# Patient Record
Sex: Female | Born: 1970 | Race: Black or African American | Hispanic: No | Marital: Single | Smoking: Never smoker
Health system: Southern US, Community
[De-identification: ages and names within clinical notes are randomized; demographics above are authoritative.]

## PROBLEM LIST (undated history)

## (undated) DIAGNOSIS — E109 Type 1 diabetes mellitus without complications: Secondary | ICD-10-CM

## (undated) DIAGNOSIS — I10 Essential (primary) hypertension: Secondary | ICD-10-CM

## (undated) DIAGNOSIS — J189 Pneumonia, unspecified organism: Secondary | ICD-10-CM

## (undated) DIAGNOSIS — K3184 Gastroparesis: Secondary | ICD-10-CM

## (undated) DIAGNOSIS — I2510 Atherosclerotic heart disease of native coronary artery without angina pectoris: Secondary | ICD-10-CM

## (undated) DIAGNOSIS — Z9989 Dependence on other enabling machines and devices: Secondary | ICD-10-CM

## (undated) DIAGNOSIS — E785 Hyperlipidemia, unspecified: Secondary | ICD-10-CM

## (undated) DIAGNOSIS — K612 Anorectal abscess: Secondary | ICD-10-CM

## (undated) DIAGNOSIS — G5600 Carpal tunnel syndrome, unspecified upper limb: Secondary | ICD-10-CM

## (undated) DIAGNOSIS — N184 Chronic kidney disease, stage 4 (severe): Secondary | ICD-10-CM

## (undated) DIAGNOSIS — E1143 Type 2 diabetes mellitus with diabetic autonomic (poly)neuropathy: Secondary | ICD-10-CM

## (undated) DIAGNOSIS — I119 Hypertensive heart disease without heart failure: Secondary | ICD-10-CM

## (undated) DIAGNOSIS — I350 Nonrheumatic aortic (valve) stenosis: Secondary | ICD-10-CM

## (undated) DIAGNOSIS — Z9289 Personal history of other medical treatment: Secondary | ICD-10-CM

## (undated) DIAGNOSIS — G4733 Obstructive sleep apnea (adult) (pediatric): Secondary | ICD-10-CM

## (undated) DIAGNOSIS — D649 Anemia, unspecified: Secondary | ICD-10-CM

## (undated) DIAGNOSIS — E1121 Type 2 diabetes mellitus with diabetic nephropathy: Secondary | ICD-10-CM

## (undated) HISTORY — DX: Chronic kidney disease, stage 4 (severe): N18.4

## (undated) HISTORY — DX: Atherosclerotic heart disease of native coronary artery without angina pectoris: I25.10

## (undated) HISTORY — DX: Gastroparesis: K31.84

## (undated) HISTORY — DX: Type 2 diabetes mellitus with diabetic nephropathy: E11.21

## (undated) HISTORY — DX: Type 2 diabetes mellitus with diabetic autonomic (poly)neuropathy: E11.43

## (undated) HISTORY — DX: Anemia, unspecified: D64.9

## (undated) HISTORY — DX: Anorectal abscess: K61.2

## (undated) HISTORY — DX: Essential (primary) hypertension: I10

## (undated) HISTORY — DX: Hyperlipidemia, unspecified: E78.5

## (undated) HISTORY — PX: LAPAROSCOPIC CHOLECYSTECTOMY: SUR755

## (undated) HISTORY — DX: Carpal tunnel syndrome, unspecified upper limb: G56.00

---

## 1999-06-10 ENCOUNTER — Encounter: Admission: RE | Admit: 1999-06-10 | Discharge: 1999-09-08 | Payer: Self-pay | Admitting: *Deleted

## 1999-09-22 ENCOUNTER — Other Ambulatory Visit: Admission: RE | Admit: 1999-09-22 | Discharge: 1999-09-22 | Payer: Self-pay | Admitting: Obstetrics & Gynecology

## 2000-02-07 ENCOUNTER — Inpatient Hospital Stay (HOSPITAL_COMMUNITY): Admission: EM | Admit: 2000-02-07 | Discharge: 2000-02-12 | Payer: Self-pay | Admitting: *Deleted

## 2000-02-15 ENCOUNTER — Other Ambulatory Visit (HOSPITAL_COMMUNITY): Admission: RE | Admit: 2000-02-15 | Discharge: 2000-02-19 | Payer: Self-pay | Admitting: Psychiatry

## 2000-10-11 ENCOUNTER — Other Ambulatory Visit: Admission: RE | Admit: 2000-10-11 | Discharge: 2000-10-11 | Payer: Self-pay | Admitting: Obstetrics & Gynecology

## 2001-01-21 ENCOUNTER — Emergency Department (HOSPITAL_COMMUNITY): Admission: EM | Admit: 2001-01-21 | Discharge: 2001-01-21 | Payer: Self-pay

## 2001-01-25 HISTORY — PX: CARPAL TUNNEL RELEASE: SHX101

## 2001-10-07 ENCOUNTER — Emergency Department (HOSPITAL_COMMUNITY): Admission: EM | Admit: 2001-10-07 | Discharge: 2001-10-08 | Payer: Self-pay | Admitting: Emergency Medicine

## 2001-10-31 ENCOUNTER — Encounter (HOSPITAL_COMMUNITY): Admission: RE | Admit: 2001-10-31 | Discharge: 2001-10-31 | Payer: Self-pay | Admitting: *Deleted

## 2001-11-10 ENCOUNTER — Other Ambulatory Visit: Admission: RE | Admit: 2001-11-10 | Discharge: 2001-11-10 | Payer: Self-pay | Admitting: Obstetrics & Gynecology

## 2002-04-04 ENCOUNTER — Other Ambulatory Visit: Admission: RE | Admit: 2002-04-04 | Discharge: 2002-04-04 | Payer: Self-pay | Admitting: Obstetrics & Gynecology

## 2002-04-10 ENCOUNTER — Encounter: Admission: RE | Admit: 2002-04-10 | Discharge: 2002-07-09 | Payer: Self-pay | Admitting: Family Medicine

## 2002-07-26 ENCOUNTER — Encounter: Admission: RE | Admit: 2002-07-26 | Discharge: 2002-10-24 | Payer: Self-pay | Admitting: Family Medicine

## 2002-09-07 ENCOUNTER — Emergency Department (HOSPITAL_COMMUNITY): Admission: EM | Admit: 2002-09-07 | Discharge: 2002-09-07 | Payer: Self-pay | Admitting: Emergency Medicine

## 2002-09-07 ENCOUNTER — Encounter: Payer: Self-pay | Admitting: Emergency Medicine

## 2002-09-13 ENCOUNTER — Emergency Department (HOSPITAL_COMMUNITY): Admission: EM | Admit: 2002-09-13 | Discharge: 2002-09-14 | Payer: Self-pay | Admitting: Emergency Medicine

## 2002-09-14 ENCOUNTER — Encounter: Payer: Self-pay | Admitting: Emergency Medicine

## 2002-10-25 ENCOUNTER — Ambulatory Visit (HOSPITAL_COMMUNITY): Admission: RE | Admit: 2002-10-25 | Discharge: 2002-10-25 | Payer: Self-pay | Admitting: Orthopedic Surgery

## 2002-11-05 ENCOUNTER — Other Ambulatory Visit: Admission: RE | Admit: 2002-11-05 | Discharge: 2002-11-05 | Payer: Self-pay | Admitting: Obstetrics & Gynecology

## 2003-03-24 ENCOUNTER — Emergency Department (HOSPITAL_COMMUNITY): Admission: EM | Admit: 2003-03-24 | Discharge: 2003-03-24 | Payer: Self-pay | Admitting: Emergency Medicine

## 2003-10-08 ENCOUNTER — Emergency Department (HOSPITAL_COMMUNITY): Admission: EM | Admit: 2003-10-08 | Discharge: 2003-10-08 | Payer: Self-pay | Admitting: Emergency Medicine

## 2003-11-29 ENCOUNTER — Emergency Department (HOSPITAL_COMMUNITY): Admission: EM | Admit: 2003-11-29 | Discharge: 2003-11-30 | Payer: Self-pay | Admitting: Emergency Medicine

## 2004-09-27 ENCOUNTER — Inpatient Hospital Stay (HOSPITAL_COMMUNITY): Admission: EM | Admit: 2004-09-27 | Discharge: 2004-09-30 | Payer: Self-pay | Admitting: Emergency Medicine

## 2004-09-30 ENCOUNTER — Ambulatory Visit: Payer: Self-pay | Admitting: Psychiatry

## 2004-10-01 ENCOUNTER — Inpatient Hospital Stay (HOSPITAL_COMMUNITY): Admission: RE | Admit: 2004-10-01 | Discharge: 2004-10-05 | Payer: Self-pay | Admitting: Psychiatry

## 2004-10-10 ENCOUNTER — Emergency Department (HOSPITAL_COMMUNITY): Admission: EM | Admit: 2004-10-10 | Discharge: 2004-10-10 | Payer: Self-pay | Admitting: Emergency Medicine

## 2004-10-11 ENCOUNTER — Emergency Department (HOSPITAL_COMMUNITY): Admission: EM | Admit: 2004-10-11 | Discharge: 2004-10-11 | Payer: Self-pay | Admitting: Emergency Medicine

## 2004-12-09 ENCOUNTER — Emergency Department (HOSPITAL_COMMUNITY): Admission: EM | Admit: 2004-12-09 | Discharge: 2004-12-10 | Payer: Self-pay | Admitting: Emergency Medicine

## 2005-03-04 ENCOUNTER — Inpatient Hospital Stay (HOSPITAL_COMMUNITY): Admission: EM | Admit: 2005-03-04 | Discharge: 2005-03-06 | Payer: Self-pay | Admitting: Emergency Medicine

## 2005-04-26 ENCOUNTER — Emergency Department (HOSPITAL_COMMUNITY): Admission: EM | Admit: 2005-04-26 | Discharge: 2005-04-26 | Payer: Self-pay | Admitting: Emergency Medicine

## 2005-07-09 ENCOUNTER — Emergency Department (HOSPITAL_COMMUNITY): Admission: EM | Admit: 2005-07-09 | Discharge: 2005-07-09 | Payer: Self-pay | Admitting: Emergency Medicine

## 2005-07-15 ENCOUNTER — Inpatient Hospital Stay (HOSPITAL_COMMUNITY): Admission: RE | Admit: 2005-07-15 | Discharge: 2005-07-24 | Payer: Self-pay | Admitting: *Deleted

## 2005-07-16 ENCOUNTER — Ambulatory Visit: Payer: Self-pay | Admitting: *Deleted

## 2005-09-14 ENCOUNTER — Ambulatory Visit (HOSPITAL_COMMUNITY): Admission: RE | Admit: 2005-09-14 | Discharge: 2005-09-14 | Payer: Self-pay | Admitting: Obstetrics and Gynecology

## 2005-09-28 ENCOUNTER — Emergency Department (HOSPITAL_COMMUNITY): Admission: EM | Admit: 2005-09-28 | Discharge: 2005-09-28 | Payer: Self-pay | Admitting: Emergency Medicine

## 2005-10-04 ENCOUNTER — Ambulatory Visit: Payer: Self-pay | Admitting: Family Medicine

## 2005-10-05 ENCOUNTER — Ambulatory Visit: Payer: Self-pay | Admitting: *Deleted

## 2006-01-13 ENCOUNTER — Ambulatory Visit: Payer: Self-pay | Admitting: Psychiatry

## 2006-01-13 ENCOUNTER — Inpatient Hospital Stay (HOSPITAL_COMMUNITY): Admission: AD | Admit: 2006-01-13 | Discharge: 2006-01-21 | Payer: Self-pay | Admitting: Psychiatry

## 2006-03-07 ENCOUNTER — Ambulatory Visit: Payer: Self-pay | Admitting: Endocrinology

## 2006-03-23 ENCOUNTER — Inpatient Hospital Stay (HOSPITAL_COMMUNITY): Admission: EM | Admit: 2006-03-23 | Discharge: 2006-03-24 | Payer: Self-pay | Admitting: Emergency Medicine

## 2006-04-04 ENCOUNTER — Emergency Department (HOSPITAL_COMMUNITY): Admission: EM | Admit: 2006-04-04 | Discharge: 2006-04-04 | Payer: Self-pay | Admitting: Emergency Medicine

## 2006-04-13 ENCOUNTER — Emergency Department (HOSPITAL_COMMUNITY): Admission: EM | Admit: 2006-04-13 | Discharge: 2006-04-13 | Payer: Self-pay | Admitting: Emergency Medicine

## 2006-04-14 ENCOUNTER — Emergency Department (HOSPITAL_COMMUNITY): Admission: EM | Admit: 2006-04-14 | Discharge: 2006-04-14 | Payer: Self-pay | Admitting: Emergency Medicine

## 2006-04-24 ENCOUNTER — Ambulatory Visit (HOSPITAL_COMMUNITY): Admission: RE | Admit: 2006-04-24 | Discharge: 2006-04-24 | Payer: Self-pay | Admitting: Orthopedic Surgery

## 2006-08-06 ENCOUNTER — Emergency Department (HOSPITAL_COMMUNITY): Admission: EM | Admit: 2006-08-06 | Discharge: 2006-08-06 | Payer: Self-pay | Admitting: Emergency Medicine

## 2006-10-24 ENCOUNTER — Emergency Department (HOSPITAL_COMMUNITY): Admission: EM | Admit: 2006-10-24 | Discharge: 2006-10-24 | Payer: Self-pay | Admitting: Emergency Medicine

## 2006-10-24 ENCOUNTER — Encounter: Payer: Self-pay | Admitting: *Deleted

## 2006-10-24 DIAGNOSIS — E13319 Other specified diabetes mellitus with unspecified diabetic retinopathy without macular edema: Secondary | ICD-10-CM | POA: Insufficient documentation

## 2006-10-24 DIAGNOSIS — F329 Major depressive disorder, single episode, unspecified: Secondary | ICD-10-CM | POA: Insufficient documentation

## 2006-10-24 DIAGNOSIS — G5600 Carpal tunnel syndrome, unspecified upper limb: Secondary | ICD-10-CM | POA: Insufficient documentation

## 2006-10-24 DIAGNOSIS — J309 Allergic rhinitis, unspecified: Secondary | ICD-10-CM | POA: Insufficient documentation

## 2006-11-01 ENCOUNTER — Emergency Department (HOSPITAL_COMMUNITY): Admission: EM | Admit: 2006-11-01 | Discharge: 2006-11-01 | Payer: Self-pay | Admitting: Emergency Medicine

## 2006-11-08 ENCOUNTER — Other Ambulatory Visit: Payer: Self-pay | Admitting: Emergency Medicine

## 2006-11-09 ENCOUNTER — Inpatient Hospital Stay (HOSPITAL_COMMUNITY): Admission: AD | Admit: 2006-11-09 | Discharge: 2006-11-12 | Payer: Self-pay | Admitting: Internal Medicine

## 2006-11-09 ENCOUNTER — Encounter: Payer: Self-pay | Admitting: Internal Medicine

## 2006-12-13 ENCOUNTER — Ambulatory Visit (HOSPITAL_COMMUNITY): Admission: RE | Admit: 2006-12-13 | Discharge: 2006-12-13 | Payer: Self-pay | Admitting: Gastroenterology

## 2007-02-15 ENCOUNTER — Telehealth: Payer: Self-pay | Admitting: Internal Medicine

## 2007-02-16 ENCOUNTER — Encounter: Payer: Self-pay | Admitting: Endocrinology

## 2007-02-17 ENCOUNTER — Encounter: Payer: Self-pay | Admitting: Internal Medicine

## 2007-02-25 ENCOUNTER — Emergency Department (HOSPITAL_COMMUNITY): Admission: EM | Admit: 2007-02-25 | Discharge: 2007-02-25 | Payer: Self-pay | Admitting: Emergency Medicine

## 2007-03-16 ENCOUNTER — Encounter: Payer: Self-pay | Admitting: Endocrinology

## 2007-09-09 ENCOUNTER — Emergency Department (HOSPITAL_COMMUNITY): Admission: EM | Admit: 2007-09-09 | Discharge: 2007-09-09 | Payer: Self-pay | Admitting: Emergency Medicine

## 2007-09-17 ENCOUNTER — Emergency Department (HOSPITAL_COMMUNITY): Admission: EM | Admit: 2007-09-17 | Discharge: 2007-09-17 | Payer: Self-pay | Admitting: Emergency Medicine

## 2008-01-12 ENCOUNTER — Encounter (INDEPENDENT_AMBULATORY_CARE_PROVIDER_SITE_OTHER): Payer: Self-pay | Admitting: General Surgery

## 2008-01-12 ENCOUNTER — Ambulatory Visit (HOSPITAL_COMMUNITY): Admission: RE | Admit: 2008-01-12 | Discharge: 2008-01-12 | Payer: Self-pay | Admitting: General Surgery

## 2008-01-26 HISTORY — PX: CARDIAC CATHETERIZATION: SHX172

## 2008-02-11 ENCOUNTER — Emergency Department (HOSPITAL_BASED_OUTPATIENT_CLINIC_OR_DEPARTMENT_OTHER): Admission: EM | Admit: 2008-02-11 | Discharge: 2008-02-11 | Payer: Self-pay | Admitting: Emergency Medicine

## 2008-02-19 ENCOUNTER — Ambulatory Visit: Payer: Self-pay | Admitting: Radiology

## 2008-02-19 ENCOUNTER — Emergency Department (HOSPITAL_BASED_OUTPATIENT_CLINIC_OR_DEPARTMENT_OTHER): Admission: EM | Admit: 2008-02-19 | Discharge: 2008-02-19 | Payer: Self-pay | Admitting: Emergency Medicine

## 2008-03-03 ENCOUNTER — Emergency Department (HOSPITAL_BASED_OUTPATIENT_CLINIC_OR_DEPARTMENT_OTHER): Admission: EM | Admit: 2008-03-03 | Discharge: 2008-03-03 | Payer: Self-pay | Admitting: Emergency Medicine

## 2008-03-09 ENCOUNTER — Emergency Department (HOSPITAL_BASED_OUTPATIENT_CLINIC_OR_DEPARTMENT_OTHER): Admission: EM | Admit: 2008-03-09 | Discharge: 2008-03-09 | Payer: Self-pay | Admitting: Emergency Medicine

## 2008-03-25 ENCOUNTER — Ambulatory Visit: Payer: Self-pay | Admitting: Diagnostic Radiology

## 2008-03-25 ENCOUNTER — Emergency Department (HOSPITAL_BASED_OUTPATIENT_CLINIC_OR_DEPARTMENT_OTHER): Admission: EM | Admit: 2008-03-25 | Discharge: 2008-03-25 | Payer: Self-pay | Admitting: Emergency Medicine

## 2008-04-01 ENCOUNTER — Emergency Department (HOSPITAL_BASED_OUTPATIENT_CLINIC_OR_DEPARTMENT_OTHER): Admission: EM | Admit: 2008-04-01 | Discharge: 2008-04-01 | Payer: Self-pay | Admitting: Emergency Medicine

## 2008-04-11 ENCOUNTER — Encounter: Admission: RE | Admit: 2008-04-11 | Discharge: 2008-04-11 | Payer: Self-pay | Admitting: Endocrinology

## 2008-04-13 ENCOUNTER — Emergency Department (HOSPITAL_BASED_OUTPATIENT_CLINIC_OR_DEPARTMENT_OTHER): Admission: EM | Admit: 2008-04-13 | Discharge: 2008-04-13 | Payer: Self-pay | Admitting: Emergency Medicine

## 2008-04-29 ENCOUNTER — Ambulatory Visit: Payer: Self-pay | Admitting: Internal Medicine

## 2008-04-29 ENCOUNTER — Ambulatory Visit: Payer: Self-pay | Admitting: Diagnostic Radiology

## 2008-04-29 ENCOUNTER — Encounter: Payer: Self-pay | Admitting: Emergency Medicine

## 2008-04-29 ENCOUNTER — Inpatient Hospital Stay (HOSPITAL_COMMUNITY): Admission: EM | Admit: 2008-04-29 | Discharge: 2008-05-03 | Payer: Self-pay | Admitting: Internal Medicine

## 2008-04-30 ENCOUNTER — Encounter (INDEPENDENT_AMBULATORY_CARE_PROVIDER_SITE_OTHER): Payer: Self-pay | Admitting: Internal Medicine

## 2008-05-21 ENCOUNTER — Emergency Department (HOSPITAL_COMMUNITY): Admission: EM | Admit: 2008-05-21 | Discharge: 2008-05-21 | Payer: Self-pay | Admitting: Emergency Medicine

## 2008-06-03 ENCOUNTER — Emergency Department (HOSPITAL_BASED_OUTPATIENT_CLINIC_OR_DEPARTMENT_OTHER): Admission: EM | Admit: 2008-06-03 | Discharge: 2008-06-03 | Payer: Self-pay | Admitting: Emergency Medicine

## 2008-06-03 ENCOUNTER — Ambulatory Visit: Payer: Self-pay | Admitting: Radiology

## 2008-06-19 ENCOUNTER — Emergency Department (HOSPITAL_BASED_OUTPATIENT_CLINIC_OR_DEPARTMENT_OTHER): Admission: EM | Admit: 2008-06-19 | Discharge: 2008-06-19 | Payer: Self-pay | Admitting: Emergency Medicine

## 2008-06-29 ENCOUNTER — Emergency Department (HOSPITAL_BASED_OUTPATIENT_CLINIC_OR_DEPARTMENT_OTHER): Admission: EM | Admit: 2008-06-29 | Discharge: 2008-06-29 | Payer: Self-pay | Admitting: Emergency Medicine

## 2008-07-02 ENCOUNTER — Ambulatory Visit (HOSPITAL_COMMUNITY): Admission: RE | Admit: 2008-07-02 | Discharge: 2008-07-02 | Payer: Self-pay | Admitting: *Deleted

## 2008-07-07 ENCOUNTER — Emergency Department (HOSPITAL_BASED_OUTPATIENT_CLINIC_OR_DEPARTMENT_OTHER): Admission: EM | Admit: 2008-07-07 | Discharge: 2008-07-07 | Payer: Self-pay | Admitting: Emergency Medicine

## 2008-07-29 ENCOUNTER — Emergency Department (HOSPITAL_COMMUNITY): Admission: EM | Admit: 2008-07-29 | Discharge: 2008-07-29 | Payer: Self-pay | Admitting: Emergency Medicine

## 2008-08-18 ENCOUNTER — Telehealth: Payer: Self-pay | Admitting: Gastroenterology

## 2008-10-08 ENCOUNTER — Emergency Department (HOSPITAL_BASED_OUTPATIENT_CLINIC_OR_DEPARTMENT_OTHER): Admission: EM | Admit: 2008-10-08 | Discharge: 2008-10-08 | Payer: Self-pay | Admitting: Emergency Medicine

## 2008-10-08 ENCOUNTER — Ambulatory Visit: Payer: Self-pay | Admitting: Interventional Radiology

## 2008-12-22 ENCOUNTER — Emergency Department (HOSPITAL_COMMUNITY): Admission: EM | Admit: 2008-12-22 | Discharge: 2008-12-22 | Payer: Self-pay | Admitting: Emergency Medicine

## 2008-12-24 ENCOUNTER — Emergency Department (HOSPITAL_COMMUNITY): Admission: EM | Admit: 2008-12-24 | Discharge: 2008-12-24 | Payer: Self-pay | Admitting: Emergency Medicine

## 2009-01-30 ENCOUNTER — Encounter (HOSPITAL_COMMUNITY): Admission: RE | Admit: 2009-01-30 | Discharge: 2009-04-30 | Payer: Self-pay | Admitting: Nephrology

## 2009-03-09 IMAGING — CT CT HEAD W/O CM
1 series · 16 of 30 positions shown, 20 images · IV contrast (agent unspecified)
Comparison: CT head of 10/11/04.

CLINICAL DATA: Headache.
 HEAD CT WITHOUT CONTRAST:
TECHNIQUE: Contiguous axial images were obtained from the base of the skull through the vertex according to standard protocol without contrast.

[Series 2: head_seq 4.5 h37s st · axial · 0.43mm/px · z∈[-622,-496]mm · 16 of 32 slices shown, 20 images]
[im 2/32  brain]
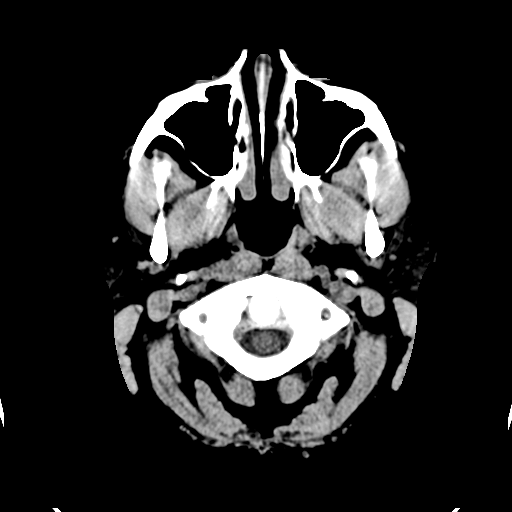
[im 2/32  bone]
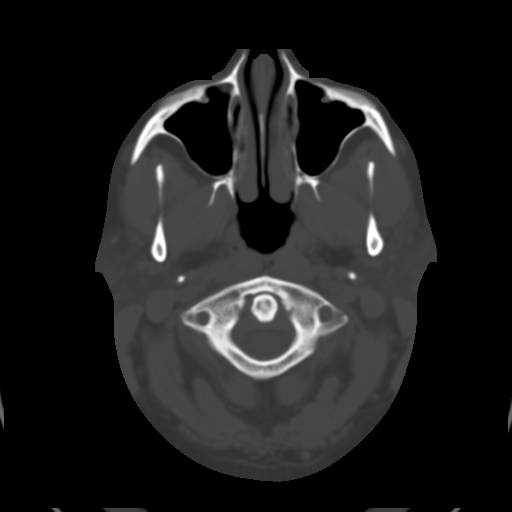
[im 4/32  brain]
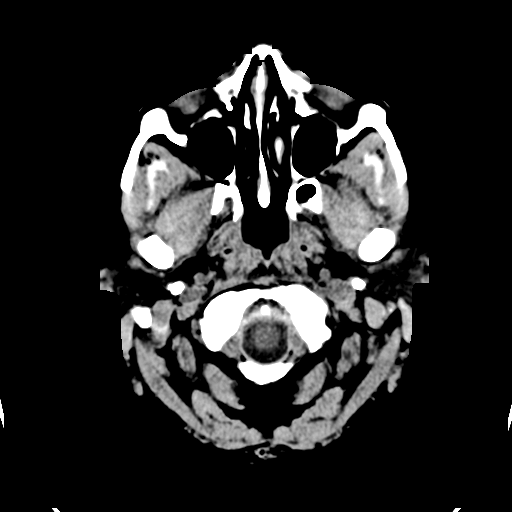
[im 6/32  brain]
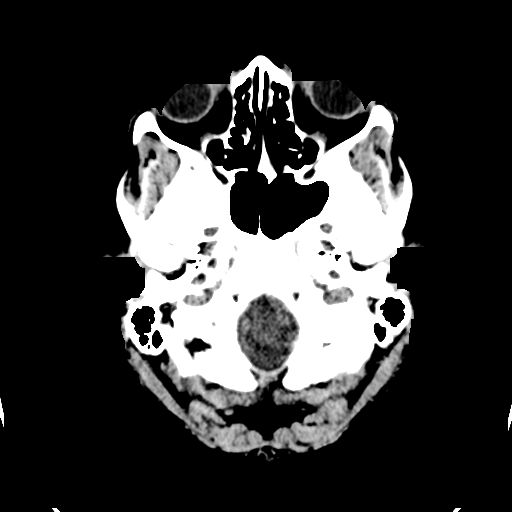
[im 8/32  brain]
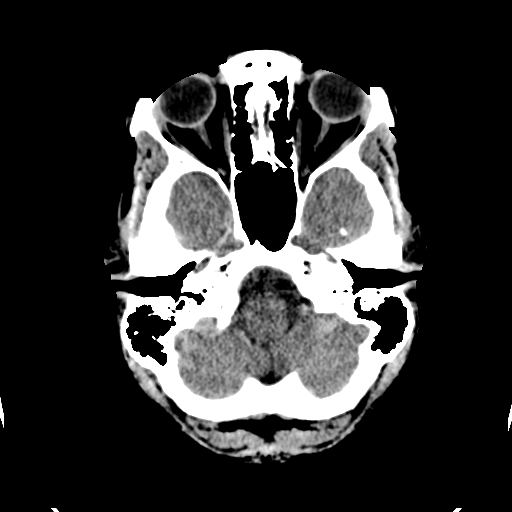
[im 9/32  brain]
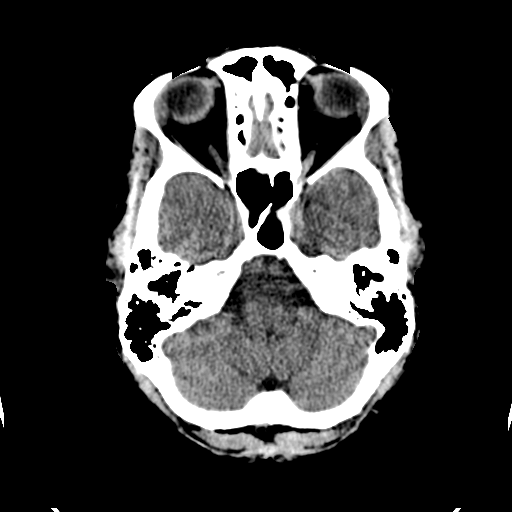
[im 9/32  bone]
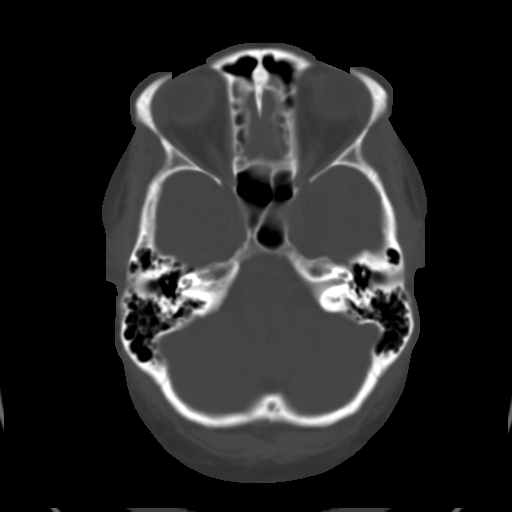
[im 11/32  brain]
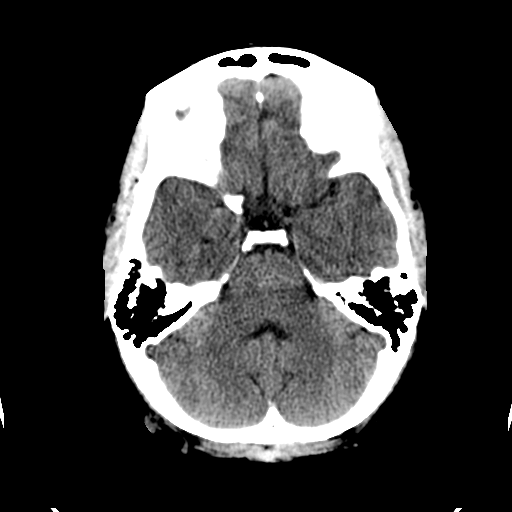
[im 13/32  brain]
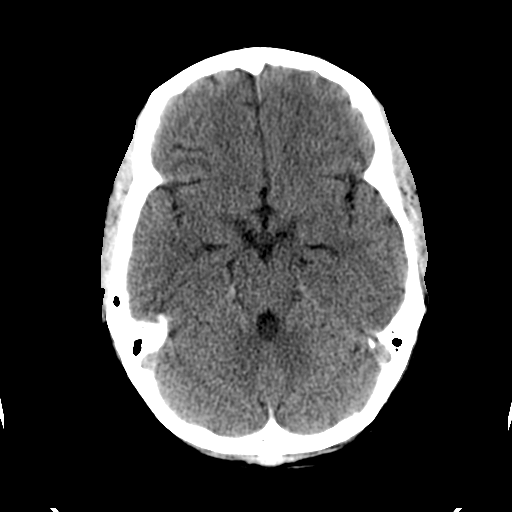
[im 15/32  brain]
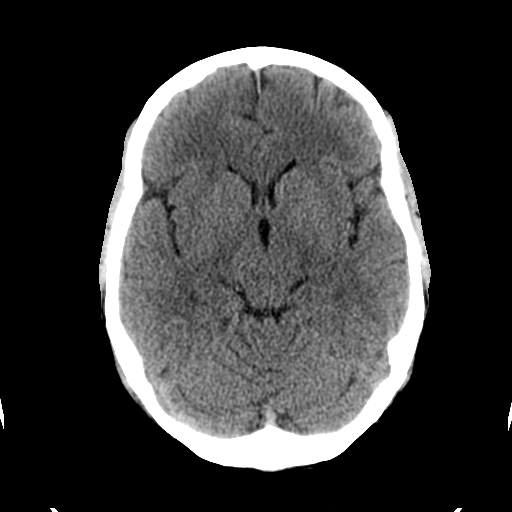
[im 17/32  brain]
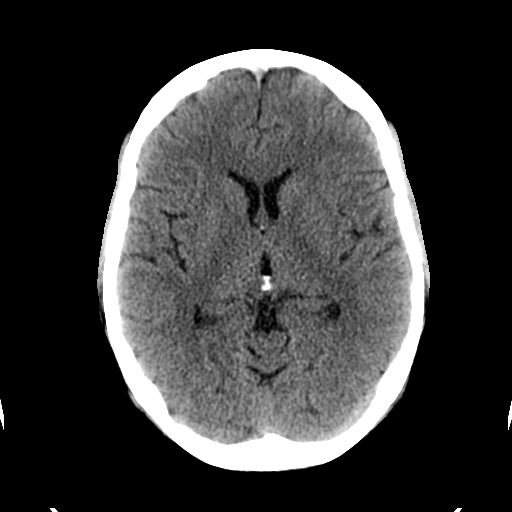
[im 17/32  bone]
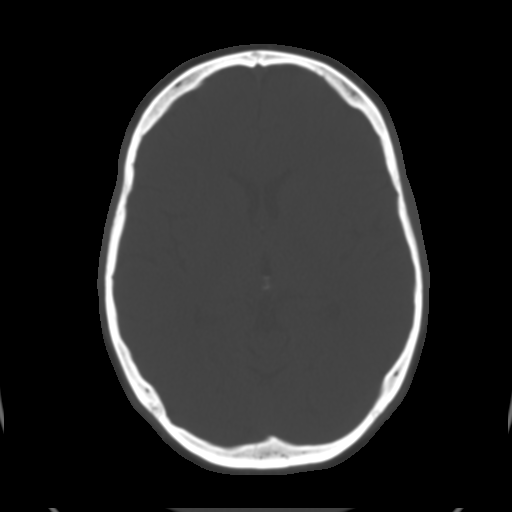
[im 19/32  brain]
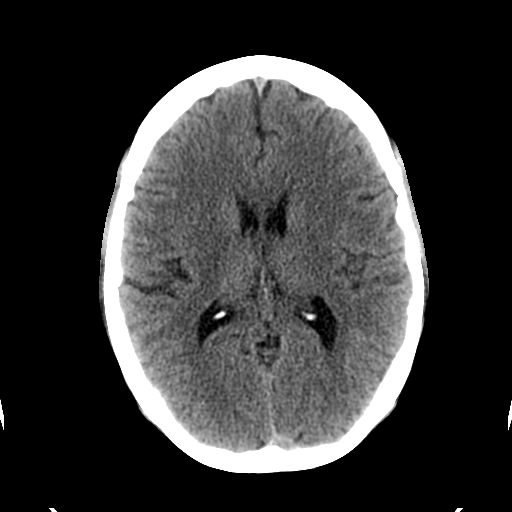
[im 21/32  brain]
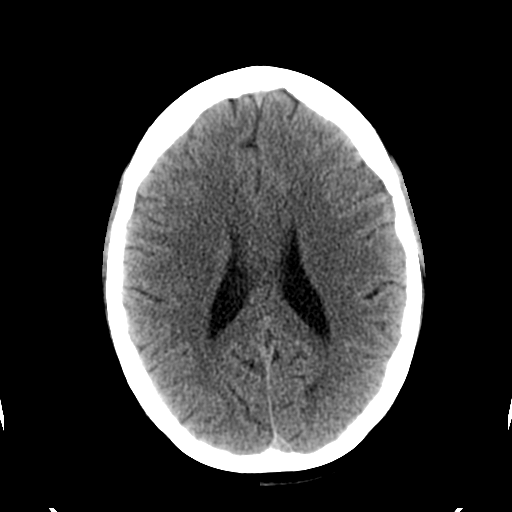
[im 23/32  brain]
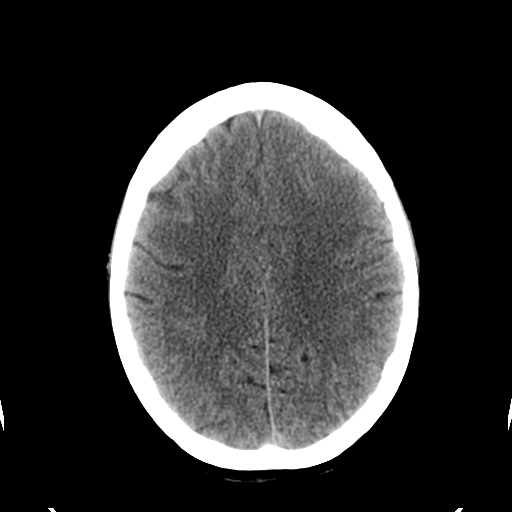
[im 24/32  brain]
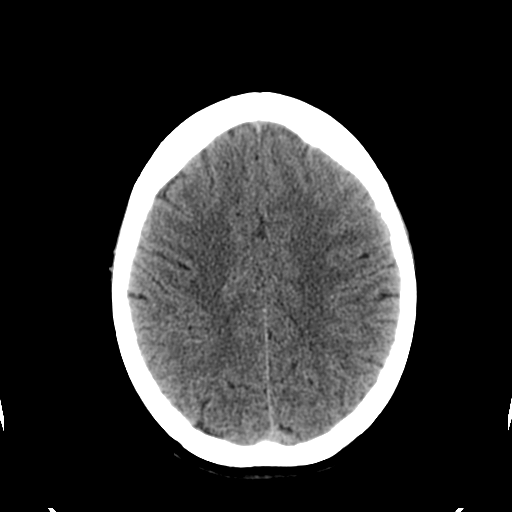
[im 24/32  bone]
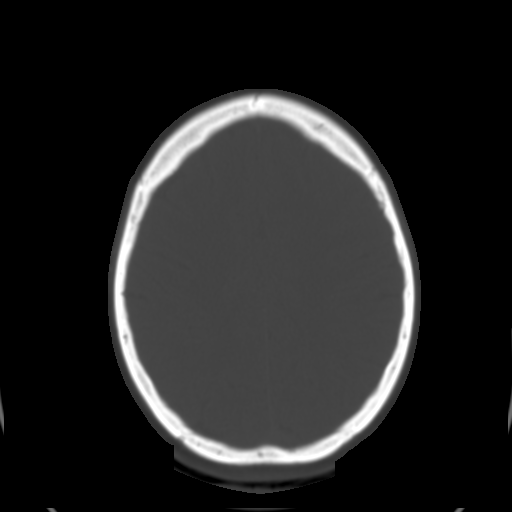
[im 26/32  brain]
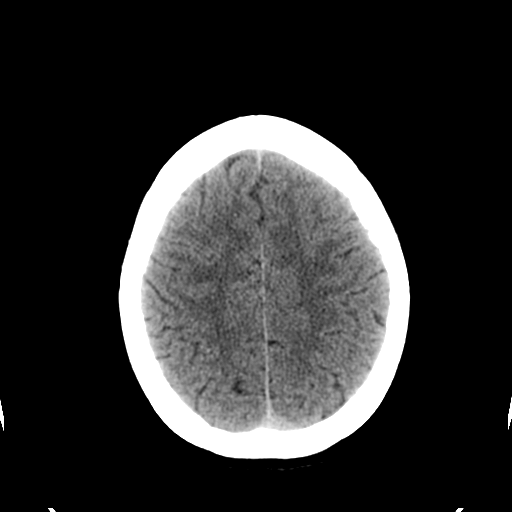
[im 28/32  brain]
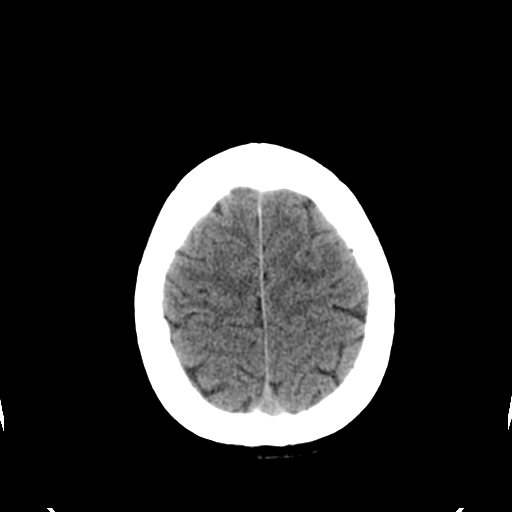
[im 30/32  brain]
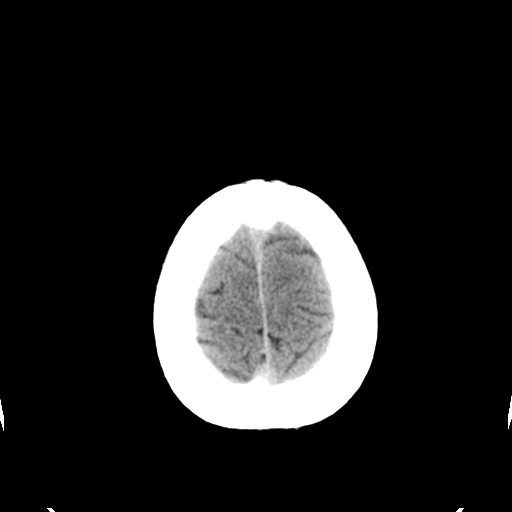

[16 of 30 positions shown; findings below may reference images not displayed]

FINDINGS: No extraaxial fluid collections or intraparenchymal hemorrhage.  The ventricles have normal volume.  No evidence of mass effect or midline shift.  Basilar cisterns are patent.  Normal gray-white matter differentiation.  Paranasal sinuses and mastoid air cells are clear.  Normal orbits.
IMPRESSION: 1. No acute intracranial process.
 2. No change from prior CT head.

## 2009-05-07 ENCOUNTER — Encounter (INDEPENDENT_AMBULATORY_CARE_PROVIDER_SITE_OTHER): Payer: Self-pay | Admitting: Internal Medicine

## 2009-05-07 ENCOUNTER — Ambulatory Visit: Payer: Self-pay | Admitting: Vascular Surgery

## 2009-05-14 ENCOUNTER — Ambulatory Visit: Payer: Self-pay | Admitting: Internal Medicine

## 2009-08-05 ENCOUNTER — Inpatient Hospital Stay (HOSPITAL_COMMUNITY): Admission: EM | Admit: 2009-08-05 | Discharge: 2009-08-08 | Payer: Self-pay | Admitting: Emergency Medicine

## 2010-01-01 ENCOUNTER — Inpatient Hospital Stay (HOSPITAL_COMMUNITY): Admission: EM | Admit: 2010-01-01 | Discharge: 2009-05-08 | Payer: Self-pay | Admitting: Emergency Medicine

## 2010-01-30 ENCOUNTER — Emergency Department (HOSPITAL_COMMUNITY)
Admission: EM | Admit: 2010-01-30 | Discharge: 2010-01-31 | Payer: Self-pay | Source: Home / Self Care | Admitting: Emergency Medicine

## 2010-02-09 LAB — POCT I-STAT, CHEM 8
BUN: 50 mg/dL — ABNORMAL HIGH (ref 6–23)
Calcium, Ion: 1.19 mmol/L (ref 1.12–1.32)
Chloride: 110 mEq/L (ref 96–112)
Creatinine, Ser: 1.5 mg/dL — ABNORMAL HIGH (ref 0.4–1.2)
Glucose, Bld: 251 mg/dL — ABNORMAL HIGH (ref 70–99)
HCT: 29 % — ABNORMAL LOW (ref 36.0–46.0)
Hemoglobin: 9.9 g/dL — ABNORMAL LOW (ref 12.0–15.0)
Potassium: 4.4 mEq/L (ref 3.5–5.1)
Sodium: 137 mEq/L (ref 135–145)
TCO2: 23 mmol/L (ref 0–100)

## 2010-02-15 ENCOUNTER — Encounter: Payer: Self-pay | Admitting: Emergency Medicine

## 2010-02-15 ENCOUNTER — Encounter: Payer: Self-pay | Admitting: Gastroenterology

## 2010-02-16 ENCOUNTER — Encounter: Payer: Self-pay | Admitting: *Deleted

## 2010-04-11 LAB — GLUCOSE, CAPILLARY
Glucose-Capillary: 117 mg/dL — ABNORMAL HIGH (ref 70–99)
Glucose-Capillary: 97 mg/dL (ref 70–99)

## 2010-04-11 LAB — COMPREHENSIVE METABOLIC PANEL
ALT: 13 U/L (ref 0–35)
AST: 15 U/L (ref 0–37)
Alkaline Phosphatase: 101 U/L (ref 39–117)
CO2: 16 mEq/L — ABNORMAL LOW (ref 19–32)
Glucose, Bld: 94 mg/dL (ref 70–99)
Potassium: 5 mEq/L (ref 3.5–5.1)
Sodium: 137 mEq/L (ref 135–145)
Total Protein: 6.9 g/dL (ref 6.0–8.3)

## 2010-04-11 LAB — CARDIAC PANEL(CRET KIN+CKTOT+MB+TROPI): Relative Index: 1.8 (ref 0.0–2.5)

## 2010-04-11 LAB — MAGNESIUM: Magnesium: 2.7 mg/dL — ABNORMAL HIGH (ref 1.5–2.5)

## 2010-04-11 LAB — CBC
HCT: 24 % — ABNORMAL LOW (ref 36.0–46.0)
Hemoglobin: 8.1 g/dL — ABNORMAL LOW (ref 12.0–15.0)
WBC: 7 10*3/uL (ref 4.0–10.5)

## 2010-04-12 LAB — FERRITIN
Ferritin: 97 ng/mL (ref 10–291)
Ferritin: 47 ng/mL (ref 10–291)

## 2010-04-12 LAB — COMPREHENSIVE METABOLIC PANEL
ALT: 12 U/L (ref 0–35)
Albumin: 3.1 g/dL — ABNORMAL LOW (ref 3.5–5.2)
Alkaline Phosphatase: 110 U/L (ref 39–117)
Alkaline Phosphatase: 111 U/L (ref 39–117)
BUN: 25 mg/dL — ABNORMAL HIGH (ref 6–23)
BUN: 28 mg/dL — ABNORMAL HIGH (ref 6–23)
CO2: 17 mEq/L — ABNORMAL LOW (ref 19–32)
Calcium: 8.7 mg/dL (ref 8.4–10.5)
Chloride: 118 mEq/L — ABNORMAL HIGH (ref 96–112)
GFR calc non Af Amer: 32 mL/min — ABNORMAL LOW (ref >60)
Glucose, Bld: 140 mg/dL — ABNORMAL HIGH (ref 70–99)
Potassium: 6 mEq/L — ABNORMAL HIGH (ref 3.5–5.1)
Potassium: 7 mEq/L (ref 3.5–5.1)
Sodium: 136 mEq/L (ref 135–145)
Total Bilirubin: 0.3 mg/dL (ref 0.3–1.2)
Total Protein: 6.7 g/dL (ref 6.0–8.3)

## 2010-04-12 LAB — DIFFERENTIAL
Basophils Absolute: 0 10*3/uL (ref 0.0–0.1)
Basophils Absolute: 0.1 10*3/uL (ref 0.0–0.1)
Basophils Relative: 0 % (ref 0–1)
Basophils Relative: 1 % (ref 0–1)
Eosinophils Absolute: 0.2 10*3/uL (ref 0.0–0.7)
Neutro Abs: 5.2 10*3/uL (ref 1.7–7.7)
Neutro Abs: 5.2 10*3/uL (ref 1.7–7.7)
Neutrophils Relative %: 66 % (ref 43–77)
Neutrophils Relative %: 67 % (ref 43–77)

## 2010-04-12 LAB — BASIC METABOLIC PANEL
BUN: 28 mg/dL — ABNORMAL HIGH (ref 6–23)
CO2: 15 mEq/L — ABNORMAL LOW (ref 19–32)
CO2: 17 mEq/L — ABNORMAL LOW (ref 19–32)
Calcium: 8.2 mg/dL — ABNORMAL LOW (ref 8.4–10.5)
Calcium: 8.5 mg/dL (ref 8.4–10.5)
Calcium: 9.1 mg/dL (ref 8.4–10.5)
Creatinine, Ser: 1.61 mg/dL — ABNORMAL HIGH (ref 0.4–1.2)
GFR calc Af Amer: 41 mL/min — ABNORMAL LOW (ref >60)
GFR calc Af Amer: 48 mL/min — ABNORMAL LOW (ref >60)
GFR calc non Af Amer: 34 mL/min — ABNORMAL LOW (ref >60)
GFR calc non Af Amer: 39 mL/min — ABNORMAL LOW (ref >60)
Glucose, Bld: 124 mg/dL — ABNORMAL HIGH (ref 70–99)
Glucose, Bld: 81 mg/dL (ref 70–99)
Potassium: 5.1 mEq/L (ref 3.5–5.1)
Potassium: 5.2 mEq/L — ABNORMAL HIGH (ref 3.5–5.1)
Sodium: 137 mEq/L (ref 135–145)
Sodium: 137 mEq/L (ref 135–145)
Sodium: 137 mEq/L (ref 135–145)

## 2010-04-12 LAB — GLUCOSE, CAPILLARY
Glucose-Capillary: 114 mg/dL — ABNORMAL HIGH (ref 70–99)
Glucose-Capillary: 128 mg/dL — ABNORMAL HIGH (ref 70–99)
Glucose-Capillary: 155 mg/dL — ABNORMAL HIGH (ref 70–99)
Glucose-Capillary: 68 mg/dL — ABNORMAL LOW (ref 70–99)
Glucose-Capillary: 82 mg/dL (ref 70–99)
Glucose-Capillary: 83 mg/dL (ref 70–99)
Glucose-Capillary: 87 mg/dL (ref 70–99)
Glucose-Capillary: 94 mg/dL (ref 70–99)
Glucose-Capillary: 95 mg/dL (ref 70–99)
Glucose-Capillary: 291 mg/dL — ABNORMAL HIGH (ref 70–99)

## 2010-04-12 LAB — CK TOTAL AND CKMB (NOT AT ARMC): Relative Index: 3.3 — ABNORMAL HIGH (ref 0.0–2.5)

## 2010-04-12 LAB — CBC
HCT: 22.1 % — ABNORMAL LOW (ref 36.0–46.0)
HCT: 24 % — ABNORMAL LOW (ref 36.0–46.0)
Hemoglobin: 7.5 g/dL — ABNORMAL LOW (ref 12.0–15.0)
Hemoglobin: 8.1 g/dL — ABNORMAL LOW (ref 12.0–15.0)
MCH: 28.5 pg (ref 26.0–34.0)
MCHC: 33.5 g/dL (ref 30.0–36.0)
MCHC: 34.1 g/dL (ref 30.0–36.0)
MCV: 83.8 fL (ref 78.0–100.0)
RBC: 2.64 MIL/uL — ABNORMAL LOW (ref 3.87–5.11)
RBC: 2.87 MIL/uL — ABNORMAL LOW (ref 3.87–5.11)
RDW: 14.1 % (ref 11.5–15.5)
RDW: 14.3 % (ref 11.5–15.5)
WBC: 7.7 10*3/uL (ref 4.0–10.5)

## 2010-04-12 LAB — URINE MICROSCOPIC-ADD ON

## 2010-04-12 LAB — TYPE AND SCREEN: ABO/RH(D): A POS

## 2010-04-12 LAB — URINE CULTURE
Colony Count: NO GROWTH
Culture: NO GROWTH
Special Requests: NEGATIVE

## 2010-04-12 LAB — BLOOD GAS, VENOUS
Acid-base deficit: 11.5 mmol/L — ABNORMAL HIGH (ref 0.0–2.0)
Patient temperature: 98.6
TCO2: 13.5 mmol/L (ref 0–100)
pCO2, Ven: 29.1 mmHg — ABNORMAL LOW (ref 45.0–50.0)
pH, Ven: 7.293 (ref 7.250–7.300)

## 2010-04-12 LAB — IRON AND TIBC
Iron: 46 ug/dL (ref 42–135)
Saturation Ratios: 16 % — ABNORMAL LOW (ref 20–55)
Saturation Ratios: 16 % — ABNORMAL LOW (ref 20–55)
Saturation Ratios: 15 % — ABNORMAL LOW (ref 20–55)
TIBC: 264 ug/dL (ref 250–470)
UIBC: 233 ug/dL
UIBC: 254 ug/dL

## 2010-04-12 LAB — PROTIME-INR
INR: 1.22 (ref 0.00–1.49)
Prothrombin Time: 15.3 seconds — ABNORMAL HIGH (ref 11.6–15.2)

## 2010-04-12 LAB — LIPID PANEL
HDL: 33 mg/dL — ABNORMAL LOW (ref >39)
LDL Cholesterol: 218 mg/dL — ABNORMAL HIGH (ref 0–99)
Total CHOL/HDL Ratio: 8.5 RATIO
VLDL: 29 mg/dL (ref 0–40)

## 2010-04-12 LAB — VITAMIN B12: Vitamin B-12: 491 pg/mL (ref 211–911)

## 2010-04-12 LAB — GRAM STAIN

## 2010-04-12 LAB — URINALYSIS, ROUTINE W REFLEX MICROSCOPIC
Glucose, UA: NEGATIVE mg/dL
Ketones, ur: NEGATIVE mg/dL
Leukocytes, UA: NEGATIVE
Protein, ur: 100 mg/dL — AB
Urobilinogen, UA: 0.2 mg/dL (ref 0.0–1.0)

## 2010-04-12 LAB — CARDIAC PANEL(CRET KIN+CKTOT+MB+TROPI)
CK, MB: 3.7 ng/mL (ref 0.3–4.0)
CK, MB: 4.8 ng/mL — ABNORMAL HIGH (ref 0.3–4.0)
Relative Index: 3.4 — ABNORMAL HIGH (ref 0.0–2.5)
Total CK: 149 U/L (ref 7–177)
Troponin I: 0.01 ng/mL (ref 0.00–0.06)
Troponin I: 0.02 ng/mL (ref 0.00–0.06)

## 2010-04-12 LAB — RETICULOCYTES
RBC.: 2.85 MIL/uL — ABNORMAL LOW (ref 3.87–5.11)
Retic Ct Pct: 1.6 % (ref 0.4–3.1)
Retic Ct Pct: 2 % (ref 0.4–3.1)

## 2010-04-12 LAB — HEMOGLOBIN A1C: Hgb A1c MFr Bld: 11.2 % — ABNORMAL HIGH (ref <5.7)

## 2010-04-12 LAB — POTASSIUM: Potassium: 7.2 mEq/L (ref 3.5–5.1)

## 2010-04-12 LAB — CULTURE, ROUTINE-ABSCESS

## 2010-04-12 LAB — POCT PREGNANCY, URINE: Preg Test, Ur: NEGATIVE

## 2010-04-15 LAB — GLUCOSE, CAPILLARY
Glucose-Capillary: 123 mg/dL — ABNORMAL HIGH (ref 70–99)
Glucose-Capillary: 149 mg/dL — ABNORMAL HIGH (ref 70–99)
Glucose-Capillary: 158 mg/dL — ABNORMAL HIGH (ref 70–99)
Glucose-Capillary: 160 mg/dL — ABNORMAL HIGH (ref 70–99)

## 2010-04-15 LAB — URINALYSIS, ROUTINE W REFLEX MICROSCOPIC
Bilirubin Urine: NEGATIVE
Glucose, UA: NEGATIVE mg/dL
Hgb urine dipstick: NEGATIVE
Ketones, ur: NEGATIVE mg/dL
Specific Gravity, Urine: 1.011 (ref 1.005–1.030)
pH: 5 (ref 5.0–8.0)

## 2010-04-15 LAB — COMPREHENSIVE METABOLIC PANEL
BUN: 61 mg/dL — ABNORMAL HIGH (ref 6–23)
CO2: 20 mEq/L (ref 19–32)
Chloride: 110 mEq/L (ref 96–112)
Creatinine, Ser: 1.63 mg/dL — ABNORMAL HIGH (ref 0.4–1.2)
GFR calc non Af Amer: 35 mL/min — ABNORMAL LOW (ref >60)
Total Bilirubin: 0.6 mg/dL (ref 0.3–1.2)

## 2010-04-15 LAB — BASIC METABOLIC PANEL
BUN: 36 mg/dL — ABNORMAL HIGH (ref 6–23)
BUN: 71 mg/dL — ABNORMAL HIGH (ref 6–23)
CO2: 21 mEq/L (ref 19–32)
Chloride: 108 mEq/L (ref 96–112)
GFR calc Af Amer: 31 mL/min — ABNORMAL LOW (ref >60)
GFR calc non Af Amer: 26 mL/min — ABNORMAL LOW (ref >60)
GFR calc non Af Amer: 48 mL/min — ABNORMAL LOW (ref >60)
Glucose, Bld: 76 mg/dL (ref 70–99)
Potassium: 3.9 mEq/L (ref 3.5–5.1)
Potassium: 5.1 mEq/L (ref 3.5–5.1)
Sodium: 136 mEq/L (ref 135–145)

## 2010-04-15 LAB — CBC
HCT: 25.4 % — ABNORMAL LOW (ref 36.0–46.0)
HCT: 25.8 % — ABNORMAL LOW (ref 36.0–46.0)
HCT: 29 % — ABNORMAL LOW (ref 36.0–46.0)
Hemoglobin: 8.8 g/dL — ABNORMAL LOW (ref 12.0–15.0)
MCHC: 33.9 g/dL (ref 30.0–36.0)
MCHC: 33.9 g/dL (ref 30.0–36.0)
MCV: 81.6 fL (ref 78.0–100.0)
MCV: 83 fL (ref 78.0–100.0)
Platelets: 260 10*3/uL (ref 150–400)
Platelets: 296 10*3/uL (ref 150–400)
RBC: 3.11 MIL/uL — ABNORMAL LOW (ref 3.87–5.11)
RBC: 3.49 MIL/uL — ABNORMAL LOW (ref 3.87–5.11)
RDW: 16.1 % — ABNORMAL HIGH (ref 11.5–15.5)
WBC: 11.9 10*3/uL — ABNORMAL HIGH (ref 4.0–10.5)
WBC: 9.5 10*3/uL (ref 4.0–10.5)

## 2010-04-15 LAB — POCT CARDIAC MARKERS
Myoglobin, poc: >500 ng/mL (ref 12–200)
Troponin i, poc: <0.05 ng/mL (ref 0.00–0.09)

## 2010-04-15 LAB — DIFFERENTIAL
Basophils Absolute: 0 10*3/uL (ref 0.0–0.1)
Basophils Absolute: 0 10*3/uL (ref 0.0–0.1)
Basophils Relative: 1 % (ref 0–1)
Eosinophils Absolute: 0.2 10*3/uL (ref 0.0–0.7)
Eosinophils Relative: 1 % (ref 0–5)
Eosinophils Relative: 3 % (ref 0–5)
Lymphocytes Relative: 15 % (ref 12–46)
Lymphocytes Relative: 28 % (ref 12–46)
Lymphocytes Relative: 35 % (ref 12–46)
Lymphs Abs: 1.8 10*3/uL (ref 0.7–4.0)
Monocytes Absolute: 0.5 10*3/uL (ref 0.1–1.0)
Monocytes Relative: 5 % (ref 3–12)
Neutro Abs: 6.1 10*3/uL (ref 1.7–7.7)
Neutrophils Relative %: 64 % (ref 43–77)

## 2010-04-15 LAB — IRON AND TIBC
Iron: 38 ug/dL — ABNORMAL LOW (ref 42–135)
Saturation Ratios: 13 % — ABNORMAL LOW (ref 20–55)
TIBC: 299 ug/dL (ref 250–470)
UIBC: 261 ug/dL

## 2010-04-15 LAB — HEMOGLOBIN A1C
Hgb A1c MFr Bld: 12.5 % — ABNORMAL HIGH (ref <5.7)
Mean Plasma Glucose: 312 mg/dL — ABNORMAL HIGH (ref <117)

## 2010-04-15 LAB — URINE MICROSCOPIC-ADD ON

## 2010-04-15 LAB — MAGNESIUM
Magnesium: 2.4 mg/dL (ref 1.5–2.5)
Magnesium: 2.7 mg/dL — ABNORMAL HIGH (ref 1.5–2.5)

## 2010-04-15 LAB — LIPID PANEL
Cholesterol: 264 mg/dL — ABNORMAL HIGH (ref 0–200)
Triglycerides: 250 mg/dL — ABNORMAL HIGH (ref <150)

## 2010-04-15 LAB — TSH: TSH: 1.374 u[IU]/mL (ref 0.350–4.500)

## 2010-04-29 LAB — POCT I-STAT, CHEM 8
Creatinine, Ser: 2.3 mg/dL — ABNORMAL HIGH (ref 0.4–1.2)
Glucose, Bld: 234 mg/dL — ABNORMAL HIGH (ref 70–99)
HCT: 29 % — ABNORMAL LOW (ref 36.0–46.0)
Hemoglobin: 9.9 g/dL — ABNORMAL LOW (ref 12.0–15.0)
Potassium: 4.9 mEq/L (ref 3.5–5.1)
Sodium: 135 mEq/L (ref 135–145)
TCO2: 22 mmol/L (ref 0–100)

## 2010-04-29 LAB — DIFFERENTIAL
Basophils Relative: 0 % (ref 0–1)
Eosinophils Absolute: 0.2 10*3/uL (ref 0.0–0.7)
Eosinophils Relative: 2 % (ref 0–5)
Lymphs Abs: 2 10*3/uL (ref 0.7–4.0)
Neutrophils Relative %: 74 % (ref 43–77)

## 2010-04-29 LAB — CBC
HCT: 27.3 % — ABNORMAL LOW (ref 36.0–46.0)
MCHC: 32.9 g/dL (ref 30.0–36.0)
MCV: 79.3 fL (ref 78.0–100.0)
Platelets: 313 10*3/uL (ref 150–400)
WBC: 11.9 10*3/uL — ABNORMAL HIGH (ref 4.0–10.5)

## 2010-05-01 LAB — URINALYSIS, ROUTINE W REFLEX MICROSCOPIC
Bilirubin Urine: NEGATIVE
Glucose, UA: 100 mg/dL — AB
Ketones, ur: 15 mg/dL — AB
pH: 5 (ref 5.0–8.0)

## 2010-05-01 LAB — CBC
MCHC: 34 g/dL (ref 30.0–36.0)
RBC: 3.45 MIL/uL — ABNORMAL LOW (ref 3.87–5.11)
RDW: 13.6 % (ref 11.5–15.5)

## 2010-05-01 LAB — COMPREHENSIVE METABOLIC PANEL
ALT: 3 U/L (ref 0–35)
AST: 21 U/L (ref 0–37)
Alkaline Phosphatase: 136 U/L — ABNORMAL HIGH (ref 39–117)
Calcium: 8.9 mg/dL (ref 8.4–10.5)
GFR calc Af Amer: 41 mL/min — ABNORMAL LOW (ref >60)
Potassium: 4.4 mEq/L (ref 3.5–5.1)
Sodium: 140 mEq/L (ref 135–145)
Total Protein: 7.3 g/dL (ref 6.0–8.3)

## 2010-05-01 LAB — DIFFERENTIAL
Eosinophils Absolute: 0.2 10*3/uL (ref 0.0–0.7)
Eosinophils Relative: 2 % (ref 0–5)
Lymphs Abs: 2.1 10*3/uL (ref 0.7–4.0)
Monocytes Absolute: 0.3 10*3/uL (ref 0.1–1.0)
Monocytes Relative: 3 % (ref 3–12)

## 2010-05-01 LAB — HEMOCCULT GUIAC POC 1CARD (OFFICE): Fecal Occult Bld: NEGATIVE

## 2010-05-01 LAB — URINE MICROSCOPIC-ADD ON

## 2010-05-04 LAB — DIFFERENTIAL
Basophils Absolute: 0 10*3/uL (ref 0.0–0.1)
Basophils Relative: 1 % (ref 0–1)
Monocytes Relative: 7 % (ref 3–12)
Neutro Abs: 5.2 10*3/uL (ref 1.7–7.7)
Neutrophils Relative %: 62 % (ref 43–77)

## 2010-05-04 LAB — URINE MICROSCOPIC-ADD ON

## 2010-05-04 LAB — URINALYSIS, ROUTINE W REFLEX MICROSCOPIC
Leukocytes, UA: NEGATIVE
Protein, ur: 100 mg/dL — AB
Urobilinogen, UA: 0.2 mg/dL (ref 0.0–1.0)

## 2010-05-04 LAB — BASIC METABOLIC PANEL
CO2: 21 mEq/L (ref 19–32)
Calcium: 8.7 mg/dL (ref 8.4–10.5)
Creatinine, Ser: 1.1 mg/dL (ref 0.4–1.2)
GFR calc Af Amer: >60 mL/min (ref >60)
GFR calc non Af Amer: 56 mL/min — ABNORMAL LOW (ref >60)

## 2010-05-04 LAB — CBC
MCHC: 33 g/dL (ref 30.0–36.0)
RBC: 3.6 MIL/uL — ABNORMAL LOW (ref 3.87–5.11)
RDW: 13.5 % (ref 11.5–15.5)

## 2010-05-04 LAB — PREGNANCY, URINE: Preg Test, Ur: NEGATIVE

## 2010-05-05 LAB — URINE CULTURE

## 2010-05-05 LAB — URINALYSIS, ROUTINE W REFLEX MICROSCOPIC
Bilirubin Urine: NEGATIVE
Bilirubin Urine: NEGATIVE
Glucose, UA: NEGATIVE mg/dL
Ketones, ur: NEGATIVE mg/dL
Leukocytes, UA: NEGATIVE
Nitrite: NEGATIVE
Specific Gravity, Urine: 1.015 (ref 1.005–1.030)
Urobilinogen, UA: 0.2 mg/dL (ref 0.0–1.0)
Urobilinogen, UA: 0.2 mg/dL (ref 0.0–1.0)
pH: 5.5 (ref 5.0–8.0)
pH: 5.5 (ref 5.0–8.0)

## 2010-05-05 LAB — DIFFERENTIAL
Basophils Relative: 1 % (ref 0–1)
Eosinophils Absolute: 0.2 10*3/uL (ref 0.0–0.7)
Lymphs Abs: 2.3 10*3/uL (ref 0.7–4.0)
Neutro Abs: 7.1 10*3/uL (ref 1.7–7.7)
Neutrophils Relative %: 69 % (ref 43–77)

## 2010-05-05 LAB — BASIC METABOLIC PANEL
BUN: 35 mg/dL — ABNORMAL HIGH (ref 6–23)
Calcium: 8.7 mg/dL (ref 8.4–10.5)
Chloride: 111 mEq/L (ref 96–112)
Creatinine, Ser: 1.2 mg/dL (ref 0.4–1.2)
GFR calc Af Amer: >60 mL/min (ref >60)

## 2010-05-05 LAB — CBC
MCV: 82.5 fL (ref 78.0–100.0)
Platelets: 333 10*3/uL (ref 150–400)
WBC: 10.3 10*3/uL (ref 4.0–10.5)

## 2010-05-05 LAB — URINE MICROSCOPIC-ADD ON

## 2010-05-06 LAB — BASIC METABOLIC PANEL
BUN: 24 mg/dL — ABNORMAL HIGH (ref 6–23)
BUN: 25 mg/dL — ABNORMAL HIGH (ref 6–23)
CO2: 21 mEq/L (ref 19–32)
CO2: 20 mEq/L (ref 19–32)
CO2: 21 mEq/L (ref 19–32)
CO2: 22 mEq/L (ref 19–32)
Calcium: 8.6 mg/dL (ref 8.4–10.5)
Chloride: 108 mEq/L (ref 96–112)
Chloride: 111 mEq/L (ref 96–112)
Chloride: 113 mEq/L — ABNORMAL HIGH (ref 96–112)
Creatinine, Ser: 1.16 mg/dL (ref 0.4–1.2)
GFR calc Af Amer: >60 mL/min (ref >60)
GFR calc Af Amer: >60 mL/min (ref >60)
GFR calc Af Amer: >60 mL/min (ref >60)
GFR calc Af Amer: >60 mL/min (ref >60)
GFR calc non Af Amer: 54 mL/min — ABNORMAL LOW (ref >60)
Glucose, Bld: 156 mg/dL — ABNORMAL HIGH (ref 70–99)
Glucose, Bld: 130 mg/dL — ABNORMAL HIGH (ref 70–99)
Glucose, Bld: 72 mg/dL (ref 70–99)
Potassium: 5.1 mEq/L (ref 3.5–5.1)
Potassium: 4.3 mEq/L (ref 3.5–5.1)
Sodium: 140 mEq/L (ref 135–145)
Sodium: 137 mEq/L (ref 135–145)
Sodium: 137 mEq/L (ref 135–145)
Sodium: 138 mEq/L (ref 135–145)

## 2010-05-06 LAB — GLUCOSE, CAPILLARY
Glucose-Capillary: 104 mg/dL — ABNORMAL HIGH (ref 70–99)
Glucose-Capillary: 115 mg/dL — ABNORMAL HIGH (ref 70–99)
Glucose-Capillary: 54 mg/dL — ABNORMAL LOW (ref 70–99)
Glucose-Capillary: 65 mg/dL — ABNORMAL LOW (ref 70–99)
Glucose-Capillary: 71 mg/dL (ref 70–99)
Glucose-Capillary: 75 mg/dL (ref 70–99)
Glucose-Capillary: 81 mg/dL (ref 70–99)
Glucose-Capillary: 82 mg/dL (ref 70–99)
Glucose-Capillary: 88 mg/dL (ref 70–99)
Glucose-Capillary: 106 mg/dL — ABNORMAL HIGH (ref 70–99)

## 2010-05-06 LAB — CBC
HCT: 31.4 % — ABNORMAL LOW (ref 36.0–46.0)
HCT: 27.6 % — ABNORMAL LOW (ref 36.0–46.0)
Hemoglobin: 10.6 g/dL — ABNORMAL LOW (ref 12.0–15.0)
Hemoglobin: 8.9 g/dL — ABNORMAL LOW (ref 12.0–15.0)
Hemoglobin: 9.5 g/dL — ABNORMAL LOW (ref 12.0–15.0)
Hemoglobin: 9.1 g/dL — ABNORMAL LOW (ref 12.0–15.0)
MCHC: 33.6 g/dL (ref 30.0–36.0)
MCHC: 33.3 g/dL (ref 30.0–36.0)
MCHC: 33.8 g/dL (ref 30.0–36.0)
MCHC: 34 g/dL (ref 30.0–36.0)
MCHC: 33.8 g/dL (ref 30.0–36.0)
MCV: 81.5 fL (ref 78.0–100.0)
MCV: 81.8 fL (ref 78.0–100.0)
MCV: 82.2 fL (ref 78.0–100.0)
MCV: 82.3 fL (ref 78.0–100.0)
Platelets: 372 10*3/uL (ref 150–400)
Platelets: 415 10*3/uL — ABNORMAL HIGH (ref 150–400)
Platelets: 395 10*3/uL (ref 150–400)
RBC: 3.19 MIL/uL — ABNORMAL LOW (ref 3.87–5.11)
RBC: 3.22 MIL/uL — ABNORMAL LOW (ref 3.87–5.11)
RBC: 3.37 MIL/uL — ABNORMAL LOW (ref 3.87–5.11)
RBC: 3.48 MIL/uL — ABNORMAL LOW (ref 3.87–5.11)
RDW: 15.3 % (ref 11.5–15.5)
RDW: 14.6 % (ref 11.5–15.5)
RDW: 14.7 % (ref 11.5–15.5)
RDW: 14.8 % (ref 11.5–15.5)
RDW: 14 % (ref 11.5–15.5)
WBC: 7.6 10*3/uL (ref 4.0–10.5)

## 2010-05-06 LAB — URINALYSIS, ROUTINE W REFLEX MICROSCOPIC
Glucose, UA: NEGATIVE mg/dL
Glucose, UA: NEGATIVE mg/dL
Leukocytes, UA: NEGATIVE
Leukocytes, UA: NEGATIVE
Protein, ur: >300 mg/dL — AB
Specific Gravity, Urine: 1.016 (ref 1.005–1.030)
Specific Gravity, Urine: 1.016 (ref 1.005–1.030)
pH: 5.5 (ref 5.0–8.0)
pH: 5.5 (ref 5.0–8.0)

## 2010-05-06 LAB — GC/CHLAMYDIA PROBE AMP, GENITAL: Chlamydia, DNA Probe: NEGATIVE

## 2010-05-06 LAB — WET PREP, GENITAL: WBC, Wet Prep HPF POC: NONE SEEN

## 2010-05-06 LAB — DIFFERENTIAL
Eosinophils Absolute: 0.2 10*3/uL (ref 0.0–0.7)
Lymphs Abs: 2.3 10*3/uL (ref 0.7–4.0)
Monocytes Relative: 6 % (ref 3–12)
Monocytes Relative: 7 % (ref 3–12)
Neutro Abs: 6.7 10*3/uL (ref 1.7–7.7)
Neutrophils Relative %: 66 % (ref 43–77)

## 2010-05-06 LAB — CARDIAC PANEL(CRET KIN+CKTOT+MB+TROPI)
CK, MB: 1.4 ng/mL (ref 0.3–4.0)
Relative Index: INVALID (ref 0.0–2.5)
Total CK: 65 U/L (ref 7–177)
Total CK: 72 U/L (ref 7–177)
Troponin I: 0.01 ng/mL (ref 0.00–0.06)
Troponin I: 0.05 ng/mL (ref 0.00–0.06)

## 2010-05-06 LAB — COMPREHENSIVE METABOLIC PANEL
ALT: <6 U/L (ref 0–35)
Albumin: 3.3 g/dL — ABNORMAL LOW (ref 3.5–5.2)
Calcium: 8.7 mg/dL (ref 8.4–10.5)
GFR calc Af Amer: >60 mL/min (ref >60)
Glucose, Bld: 106 mg/dL — ABNORMAL HIGH (ref 70–99)
Sodium: 141 mEq/L (ref 135–145)
Total Protein: 7.4 g/dL (ref 6.0–8.3)

## 2010-05-06 LAB — URINE MICROSCOPIC-ADD ON

## 2010-05-06 LAB — RETICULOCYTES
RBC.: 3.27 MIL/uL — ABNORMAL LOW (ref 3.87–5.11)
Retic Count, Absolute: 45.8 10*3/uL (ref 19.0–186.0)
Retic Ct Pct: 1.4 % (ref 0.4–3.1)

## 2010-05-06 LAB — IRON AND TIBC
Iron: 29 ug/dL — ABNORMAL LOW (ref 42–135)
TIBC: 226 ug/dL — ABNORMAL LOW (ref 250–470)

## 2010-05-06 LAB — LIPID PANEL
HDL: 23 mg/dL — ABNORMAL LOW (ref >39)
Total CHOL/HDL Ratio: 11.9 RATIO
VLDL: 23 mg/dL (ref 0–40)

## 2010-05-06 LAB — HEMOGLOBIN A1C: Mean Plasma Glucose: 189 mg/dL

## 2010-05-06 LAB — POCT CARDIAC MARKERS
CKMB, poc: 2.4 ng/mL (ref 1.0–8.0)
Troponin i, poc: <0.05 ng/mL (ref 0.00–0.09)

## 2010-05-06 LAB — D-DIMER, QUANTITATIVE: D-Dimer, Quant: 1.67 ug/mL-FEU — ABNORMAL HIGH (ref 0.00–0.48)

## 2010-05-06 LAB — PROTIME-INR: Prothrombin Time: 13.8 seconds (ref 11.6–15.2)

## 2010-05-06 LAB — BRAIN NATRIURETIC PEPTIDE: Pro B Natriuretic peptide (BNP): 38.5 pg/mL (ref 0.0–100.0)

## 2010-05-06 LAB — FERRITIN: Ferritin: 69 ng/mL (ref 10–291)

## 2010-05-07 LAB — GLUCOSE, CAPILLARY

## 2010-05-07 LAB — URINALYSIS, ROUTINE W REFLEX MICROSCOPIC
Leukocytes, UA: NEGATIVE
Protein, ur: >300 mg/dL — AB
Urobilinogen, UA: 0.2 mg/dL (ref 0.0–1.0)

## 2010-05-07 LAB — URINE MICROSCOPIC-ADD ON

## 2010-05-11 LAB — COMPREHENSIVE METABOLIC PANEL
AST: 14 U/L (ref 0–37)
BUN: 23 mg/dL (ref 6–23)
CO2: 21 mEq/L (ref 19–32)
Chloride: 108 mEq/L (ref 96–112)
Creatinine, Ser: 1 mg/dL (ref 0.4–1.2)
GFR calc Af Amer: >60 mL/min (ref >60)
GFR calc non Af Amer: >60 mL/min (ref >60)
Total Bilirubin: 0.5 mg/dL (ref 0.3–1.2)

## 2010-05-11 LAB — CBC
HCT: 32.6 % — ABNORMAL LOW (ref 36.0–46.0)
Hemoglobin: 10.3 g/dL — ABNORMAL LOW (ref 12.0–15.0)
MCV: 83.3 fL (ref 78.0–100.0)
Platelets: 398 10*3/uL (ref 150–400)
RBC: 3.91 MIL/uL (ref 3.87–5.11)
RDW: 13.1 % (ref 11.5–15.5)
WBC: 8.5 10*3/uL (ref 4.0–10.5)

## 2010-05-11 LAB — BASIC METABOLIC PANEL
BUN: 18 mg/dL (ref 6–23)
Calcium: 9.1 mg/dL (ref 8.4–10.5)
Creatinine, Ser: 0.9 mg/dL (ref 0.4–1.2)
GFR calc non Af Amer: >60 mL/min (ref >60)
Glucose, Bld: 217 mg/dL — ABNORMAL HIGH (ref 70–99)
Sodium: 137 mEq/L (ref 135–145)

## 2010-05-11 LAB — DIFFERENTIAL
Basophils Absolute: 0 10*3/uL (ref 0.0–0.1)
Basophils Absolute: 0.1 10*3/uL (ref 0.0–0.1)
Basophils Relative: 1 % (ref 0–1)
Eosinophils Relative: 3 % (ref 0–5)
Lymphocytes Relative: 28 % (ref 12–46)
Lymphocytes Relative: 32 % (ref 12–46)
Neutro Abs: 4.6 10*3/uL (ref 1.7–7.7)
Neutrophils Relative %: 61 % (ref 43–77)

## 2010-05-11 LAB — URINE MICROSCOPIC-ADD ON

## 2010-05-11 LAB — URINALYSIS, ROUTINE W REFLEX MICROSCOPIC
Bilirubin Urine: NEGATIVE
Glucose, UA: 500 mg/dL — AB
Ketones, ur: NEGATIVE mg/dL
Leukocytes, UA: NEGATIVE
Nitrite: NEGATIVE
Nitrite: NEGATIVE
Protein, ur: >300 mg/dL — AB
Protein, ur: >300 mg/dL — AB
Specific Gravity, Urine: 1.014 (ref 1.005–1.030)
Urobilinogen, UA: 0.2 mg/dL (ref 0.0–1.0)
pH: 6 (ref 5.0–8.0)

## 2010-05-11 LAB — HEPATIC FUNCTION PANEL
ALT: 6 U/L (ref 0–35)
AST: 14 U/L (ref 0–37)
Total Protein: 7.4 g/dL (ref 6.0–8.3)

## 2010-05-11 LAB — PREGNANCY, URINE: Preg Test, Ur: NEGATIVE

## 2010-05-12 LAB — DIFFERENTIAL
Eosinophils Absolute: 0.3 10*3/uL (ref 0.0–0.7)
Lymphocytes Relative: 33 % (ref 12–46)
Lymphs Abs: 2.8 10*3/uL (ref 0.7–4.0)
Monocytes Relative: 6 % (ref 3–12)
Neutro Abs: 4.9 10*3/uL (ref 1.7–7.7)
Neutrophils Relative %: 57 % (ref 43–77)

## 2010-05-12 LAB — CBC
Hemoglobin: 11.7 g/dL — ABNORMAL LOW (ref 12.0–15.0)
MCHC: 34.1 g/dL (ref 30.0–36.0)
MCV: 81.8 fL (ref 78.0–100.0)
RDW: 14.1 % (ref 11.5–15.5)

## 2010-05-12 LAB — URINE MICROSCOPIC-ADD ON

## 2010-05-12 LAB — LIPASE, BLOOD: Lipase: 52 U/L (ref 23–300)

## 2010-05-12 LAB — COMPREHENSIVE METABOLIC PANEL
ALT: 7 U/L (ref 0–35)
Calcium: 9.6 mg/dL (ref 8.4–10.5)
Creatinine, Ser: 1.2 mg/dL (ref 0.4–1.2)
GFR calc non Af Amer: 51 mL/min — ABNORMAL LOW (ref >60)
Glucose, Bld: 197 mg/dL — ABNORMAL HIGH (ref 70–99)
Sodium: 138 mEq/L (ref 135–145)
Total Protein: 7.8 g/dL (ref 6.0–8.3)

## 2010-05-12 LAB — URINALYSIS, ROUTINE W REFLEX MICROSCOPIC
Leukocytes, UA: NEGATIVE
Nitrite: NEGATIVE
Specific Gravity, Urine: 1.016 (ref 1.005–1.030)
Urobilinogen, UA: 0.2 mg/dL (ref 0.0–1.0)
pH: 5.5 (ref 5.0–8.0)

## 2010-05-12 LAB — PREGNANCY, URINE: Preg Test, Ur: NEGATIVE

## 2010-06-09 NOTE — Discharge Summary (Signed)
NAMEJACKLIN, Jasmine [name]                ACCOUNT NO.:  [id number]   MEDICAL RECORD NO.:  [id number]          PATIENT TYPE:  INP   LOCATION:  5524                         FACILITY:  [facility]   PHYSICIAN:  Hind I Elsaid, MD      DATE OF BIRTH:  1970-04-18   DATE OF ADMISSION:  11/09/2006  DATE OF DISCHARGE:  11/12/2006                               DISCHARGE SUMMARY   DISCHARGE DIAGNOSES:  1. Escherichia coli urinary tract infection/pyelonephritis.  2. Uncontrolled diabetes mellitus.  3. Nausea and vomiting thought secondary to diabetic gastroparesis and      urinary tract infection, resolved.  Patient may need a gastric-      emptying study at Dr. Haywood Pao office.  4. Hypertension.  5. History of bipolar disorder and depression.  6. History of medical overdose and suicidal attempt.  7. History of bilateral carpal tunnel release.   MEDICATIONS:  1. Keflex 500 mg p.o. q.i.d. for one week.  2. Insulin Lantus 6 units subcutaneously q.h.s.  3. Insulin NovoLog sliding scale.  4. Reglan 10 mg p.o. t.i.d. a.c. for two weeks.  5. Clonidine 0.1 mg p.o. b.i.d.  6. Altace 10 mg p.o. daily.  7. Carafate 1 gm b.i.d.  8. Vicodin one tab p.o. q.6 hours p.r.n. for back pain for two weeks.   CONSULTATIONS:  None.   PROCEDURES:  CT abdomen and pelvis which was unremarkable.   Helicobacter pylori antigen was negative.  Hemoglobin C1 was 11.5.  Lipase and amylase was negative.  Hepatic function was normal.  Urine  culture grew E. coli which was sensitive to Rocephin and cefazolin.   HISTORY OF PRESENT ILLNESS:  Please review the history done by Dr.  Hillery Aldo.   This is a 40 year old female who came with abdominal pain on September  29, found to have urinary tract infection, received Bactrim.  Presented  to the ED again on October 7, chief complaint of abdominal pain.  Had a  urinary tract infection, admitted to the hospital after developing  nausea, vomiting and abdominal pain thought to be  secondary to E. coli  urinary tract infection.   ASSESSMENT/PLAN:  1. Recurrent abdominal pain of recent urinary tract infection.  CT      abdomen and pelvis was done which was completely negative.      Helicobacter pylori was negative.  Patient had a urinalysis which      grew Escherichia coli which is sensitive to Rocephin; patient was      placed on Rocephin accordingly.  During hospitalization, patient      had no other episode of epigastric abdominal pain, tolerating diet      very well.  So, we thought the patient's abdominal pain most      probably secondary to urinary tract infection which is resolved at      this time.  Patient will be discharged on Keflex p.o. for one week      and follow with her primary care physician.  Other possibilities of      nausea, vomiting and recurrent abdominal pain with hemoglobin C1 of  11.7, we think there could be a possibility of diabetic      gastroparesis.  Patient will be discharged on Reglan p.o. t.i.d.      We recommend nuclear gastric-emptying study to be done at Dr.      Haywood Pao office, as the patient has appointment with him on October      27.  2. Uncontrolled diabetes mellitus.  Hemoglobin C1 11.7.  Patient to      continue her Lantus regime and a sliding scale and further      recommendation to be addressed by her primary care.  3. Hypertension remained under good control.  4. Nausea and vomiting completely resolved during hospitalization.      Helicobacter pylori antibody was negative, liver function tests and      lipase were negative, CT abdomen and pelvis was negative.  We think      it could be a complication from uncontrolled diabetes mellitus and      with diabetic gastroparesis, patient will be discharged with Reglan      and further recommendation to be addressed by Dr. Elnoria Howard.   DISPOSITION:  We feel that the patient is medically stable to be  discharged home.  Follow with Dr. Elnoria Howard and follow with your primary  care,  further adjustment of your blood sugar.      Hind Bosie Helper, MD  Electronically Signed     HIE/MEDQ  D:  11/12/2006  T:  11/13/2006  Job:  [id number]   cc:   Jordan Hawks. Elnoria Howard, MD

## 2010-06-09 NOTE — Cardiovascular Report (Signed)
Jasmine [name], Jasmine [name]                ACCOUNT NO.:  [id number]   MEDICAL RECORD NO.:  [id number]          PATIENT TYPE:  AMB   LOCATION:  CATH                         FACILITY:  [facility]   PHYSICIAN:  Peter C. Eden Emms, MD, FACCDATE OF BIRTH:  Nov 25, 1970   DATE OF PROCEDURE:  05/02/2008  DATE OF DISCHARGE:                            CARDIAC CATHETERIZATION   Coronary arteriography.   INDICATIONS:  Multiple coronary risk factors including uncontrolled  diabetes, hypertension, central obesity, chest pain, and shortness of  breath.   Cine catheterization was done with 5-French catheter from right femoral  artery.  At the end of the case, we injected the right iliac and found  to be in a good position for Angio-Seal.  We had good hemostasis after  sheath removal.   CORONARY ARTERIOGRAPHY:  Left main coronary artery was normal.   The proximal LAD had a 30% tubular lesion.   Mid and distal LAD had 30% multiple discrete lesions.   First diagonal branch was a small less than 1.5 mm vessel.  There is a  60% midvessel lesion.  The left circumflex coronary artery was large and  dominant.  The first obtuse marginal branch was small and normal.  Second and third obtuse marginal branches were larger and normal.  The  mid circumflex had 30% tubular disease.  The distal left circumflex, PD,  and PLA were normal.   The right coronary artery was small and nondominant and it was normal.   RAO ventriculography:  RAO ventriculography was normal, EF of 60%.  There were no regional wall motion abnormalities.  Left ventricular  pressure was 162/10, aortic pressure is 165/82.   IMPRESSION:  The patient does not have significant coronary artery  disease.  Her left ventricular function is normal and her end-diastolic  pressure is normal.  She needs aggressive risk factor modification and  weight loss.  Her hemoglobin A1c what I believe was 8 on admission.   She tolerated the procedure well and will be sent  back to [facility].      Theron Arista C. Eden Emms, MD, [facility]  Electronically Signed     PCN/MEDQ  D:  05/02/2008  T:  05/03/2008  Job:  [id number]

## 2010-06-09 NOTE — Discharge Summary (Signed)
NAMETHEKLA, Jasmine [name]                ACCOUNT NO.:  [id number]   MEDICAL RECORD NO.:  [id number]          PATIENT TYPE:  INP   LOCATION:  1425                         FACILITY:  [facility]   PHYSICIAN:  Lonia Blood, M.D.      DATE OF BIRTH:  1970/08/26   DATE OF ADMISSION:  04/29/2008  DATE OF DISCHARGE:                               DISCHARGE SUMMARY   PRIMARY CARE PHYSICIAN:  The patient has no primary care physician.  She  is going to follow up with [place].   ENDOCRINOLOGIST:  Dr. Talmage Nap.   NEPHROLOGIST:  Dr. Arrie Aran.   CARDIOLOGY:  [facility].   DISCHARGE DIAGNOSIS:  1. New CHF secondary to longstanding hypertension, diabetes.  2. Diabetes control.  3. Hypertension.  4. Dyslipidemia.  5. Proteinuria.  6. Normocytic anemia.  7. Transient chest pain with nonobstructive coronary artery disease.   DISCHARGE MEDICATIONS:  1. Aldomet 500 mg p.o. b.i.d.  2. Norvasc 10 mg daily.  3. Iron pill one tablet daily.  4. Aspirin 81 mg daily.  5. Humalog insulin sliding scale.  6. Lantus insulin 50 units in the morning and 50 units in the evening.  7. Crestor 20 mg daily.  8. Lisinopril 10 mg p.o. daily.   DISPOSITION:  The patient will be discharged home to follow up with me  in the clinic as well as her nephrologist and cardiologist.   PROCEDURES PERFORMED:  1. Chest x-ray performed on admission on [name] 5 that showed minimal      bronchitic changes.  2. Chest CT without contrast that showed no evidence for acute      pulmonary embolus but suggested congestive heart failure.  3. Cardiac cath performed [name] 8 that shows no significant      atherosclerosis.  4. A 2-D echocardiogram performed on [name] 6 showed an EF of 60%, and      no significant findings, only minimal calcification on mitral      valve.   CONSULTATIONS:  [facility], Dr. Lewayne Bunting.   BRIEF HISTORY AND PHYSICAL:  Please refer to my dictated history and  physical on admission.  In short, however,  the patient is a 40 year old  female with a longstanding history of diabetes, hypertension,  dyslipidemia and morbid obesity as well as family history for coronary  artery disease that presented with shortness of breath, some chest pain.  The patient had classic cardiac history with risk factors for coronary  artery disease.  She was subsequently admitted for further management.   HOSPITAL COURSE:  1. Shortness of breath.  The patient was admitted and evaluated for      mild early CHF.  Due to her risk factors for coronary artery      disease, she had a cardiac cath done.  She was diuresed      aggressively.  The catheterization showed nonobstructive disease.      The patient has since improved.  Her shortness of breath has      stabilized.  She is to continue on aggressive blood pressure      control with lisinopril.  2. Dyslipidemia.  The patient is currently on statin now with Crestor.      She has been given a prescription for that.  3. Hypertension.  Blood pressure was poorly controlled, but currently      has improved with addition of ACE inhibitor.  4. Normocytic anemia.  This is most likely anemia of chronic disease      in a menstruating young woman.  5. Diabetes.  The patient's blood sugar controlled on her home      regimen.  We, therefore, are not making any significant change to      her regimen at this point.  6. Morbid obesity.  She received nutritional counseling for weight      loss while in the hospital.  Otherwise she is currently stable and      ready for discharge.      Lonia Blood, M.D.  Electronically Signed     LG/MEDQ  D:  05/03/2008  T:  05/03/2008  Job:  [id number]

## 2010-06-09 NOTE — H&P (Signed)
NAME:  Jasmine [name], Jasmine [name]                ACCOUNT NO.:  [id number]   MEDICAL RECORD NO.:  [id number]          PATIENT TYPE:  INP   LOCATION:  1425                         FACILITY:  [facility]   PHYSICIAN:  Lonia Blood, M.D.      DATE OF BIRTH:  [birth year]   DATE OF ADMISSION:  04/29/2008  DATE OF DISCHARGE:                              HISTORY & PHYSICAL   PRIMARY CARE PHYSICIAN:  The patient has no primary care physician.  Her  endocrinologist is Dr. Talmage Nap.  Nephrologist is Dr. Arrie Aran.   PRESENTING COMPLAINT:  Shortness of breath.   HISTORY OF PRESENT ILLNESS:  The patient is a 40 year old female  transferred from [facility] ER after complaining of gradual shortness of  breath that started since last August.  The patient noted that she was  having shortness of breath when she climbs stairs.  At that time, she  attributed it to her big size.  Over time, however, this got worse to  the extent that she was getting short of breath even at rest today.  Associated with the shortness of breath today, was some chest pressure.  She had some mild cough.  She also had history of PND.  No orthopnea as  far as she knows, but she has never laid down flat.  She always sleep  with 2 pillows.  She has some feet and bilateral legs swelling on-and-  off in the past couple of months.  Has some mild dry cough.  She denied  any hemoptysis.  No nausea, vomiting.  She denied any diarrhea.   PAST MEDICAL HISTORY:  Significant for:  1. Insulin-dependent diabetes.  2. Hypertension.  3. Dyslipidemia.  4. Morbid obesity.  5. Proteinuria for which she has seen nephrology.   ALLERGIES:  She is allergic to:  1. PAXIL, that causes vomiting.  2. LAMICTAL, that causes rash.  3. COMBIVENT, that causes dystonia.  4. OXYCODONE, that causes pruritus.   MEDICATIONS:  Include:  1. Aldomet 500 mg 2 tablets twice a day.  2. Norvasc 10 mg daily.  3. Iron pill 1 tablet daily.  4. Aspirin 81 mg daily.  5. Humalog insulin  at meal times as needed, sliding scale.  6. Lantus insulin 50 units in the morning and 50 units in the evening.  7. Muscardine grape seed 1 tablet daily.   SOCIAL HISTORY:  The patient lives in [place].  She works for the  [employer].  She denied any tobacco, alcohol or IV drug use.   FAMILY HISTORY:  Her mother died at age of 67 from MI.  She also had  hypertension at the time.  Father is still alive at the age of 23 with  asthma.   REVIEW OF SYSTEMS:  A 12 point review of systems is negative, except per  HPI.   PHYSICAL EXAMINATION:  VITAL SIGNS:  Temperature is 98.5  Blood pressure  178/86, pulse 101, respiratory 20.  Her saturation is 96% on room air.  GENERALLY:  She is awake, alert, oriented pleasant though in no acute  distress.  HEENT: PERRL.  EOMI.  NECK:  Supple.  No JVD, no lymphadenopathy.  RESPIRATORY:  She has good air entry bilaterally.  No wheezes, no rales.  No crackles.  ABDOMEN:  Soft, nontender, with positive bowel sounds.  EXTREMITIES:  Show no edema, cyanosis or clubbing at this point.   LABS:  Sodium 141, potassium 4.9, chloride 111, CO2 21, glucose 106, BUN  31, creatinine 1.0, calcium 8.7, total protein 7.4.  The rest of the  LFTs are within normal, except for albumin of 3.3.  Her white count is  10.2, hemoglobin 9.1, platelet count 395 with normal differentials.  Her  D-dimer is 1.67.  Stool occult blood is negative.  Urinalysis is  positive for proteinuria and cloudy-urine with trace blood.  Urine  microscopy showed many squamous cells, hyaline casts and epithelial  casts, wbc's 3-6 with few bacteria.  Chest x-ray showed minimal  bronchitic changes.  Chest CT angiogram shows no evidence for acute  pulmonary embolus, but suspect congestive heart failure.   ASSESSMENT:  Therefore, this is a 40 year old female with strong family  history for coronary artery disease, presenting with what appeared to be  gradual shortness of breath and now chest  pressure and pain.  Her EKG  showed normal sinus rhythm with poor R-wave progression, but no ST-T  wave changes.  At this point, pulmonary embolus has been ruled out.  The  patient did not have a brain natriuretic peptide checked, but per chest  CT suggests possible pulmonary edema.   PLAN:  1. Shortness of breath.  We will admit the patient and work her up for      possible CHF.  I will check a BNP right now.  I will repeat EKG in      the morning and possibly chest x-ray.  The patient has received      diuretics and seemed to be doing fine right now.  We will check a 2-      D echo and based on the results of the 2-D echo, we will see what      further treatment will need to do for the patient.  At this point      however, I put her on some low-dose Lasix, continue with her home      medication with blood pressure control.  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  2. Insulin-dependent diabetes.  I will continue with her home dose      Lantus and add sliding scale insulin with NovoLog.  3. Hypertension, uncontrolled as of now.  We will put her back on her      home medicine and adjust as necessary.  4. Dyslipidemia.  I will check fasting lipid panel and it if his high,      we will consider starting a statin.  5. Morbid obesity.  The patient has been counseled extensively.  6. Possible UTI.  The patient has no fever and no elevation of  white      count.  I will empirically treat her if she develops fever or her      white counts continue to rise.  7. Family history of coronary artery disease.  Again, we will check      serial cardiac enzymes and 2-D echo.  The patient will definitely      benefit from stress test if all her tests are normal at this point.  She has all the risk      factors for coronary artery disease any ways with diabetes,      hypertension, dyslipidemia, obesity, etc..  Hence, she will  end-up      getting some workup at some point.  Further treatment will depend      on how the patient does in the hospital.      Lonia Blood, M.D.  Electronically Signed     LG/MEDQ  D:  04/29/2008  T:  04/29/2008  Job:  [id number]

## 2010-06-09 NOTE — Consult Note (Signed)
Jasmine [name], PLASS NO.:  [id number]   MEDICAL RECORD NO.:  [id number]          PATIENT TYPE:  INP   LOCATION:  1425                         FACILITY:  [facility]   PHYSICIAN:  Doylene Canning. [name], MD    DATE OF BIRTH:  1971/01/25   DATE OF CONSULTATION:  05/01/2008  DATE OF DISCHARGE:                                 CONSULTATION   REFERRING PHYSICIAN:  [facility].   PRIMARY CARDIOLOGIST:  Jasmine [name], being seen by Doylene Canning.  [name], MD.   PATIENT PROFILE:  A 40 year old African American female with multiple  risk factors for coronary disease including diabetes, hypertension,  hyperlipidemia, as well as obesity who presents with progressive  exertional dyspnea and unstable angina.   PROBLEMS:  1. Unstable angina.  2. Exertional dyspnea.  3. Diabetes mellitus x15 years.  4. Hypertension x15 years.  5. Hyperlipidemia x15 years.  6. Morbid obesity.  7. Diabetic nephropathy/proteinuria.  8. Normocytic anemia.  9. Depression with history of suicide attempt in 2008.  10.History of bipolar disorder with psychiatric admission in September      of 2006.   HISTORY OF PRESENT ILLNESS:  A 40 year old [place] American female with  no prior cardiac history.  She was diagnosed with diabetes, hypertension  and hyperlipidemia approximately 15 years ago.  She was in her usual  state of health and approximately 8 months ago when she began  experiencing dyspnea on exertion with inclines and stairs.  Approximately, 1 week ago she noted dyspnea with minimal exertion, as  well as orthopnea and mild lower extremity edema.  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  She also developed 4/10 substernal chest pressure and tightness with  radiation to the bilateral arms associated with  diaphoresis, dyspnea and  headache.  She sat at work for approximately 30 minutes with some  reduction of the chest discomfort and dyspnea and then drove herself to  the [facility].  Once there, the symptoms finally eased off  after approximately 3 and 1/2 hours of total duration.  She did undergo  CT angiography which was negative for PE.  There was some suggestion of  CHF by CT and her BNP was mildly elevated at 119.  She was treated with  intravenous Lasix in the ER and sent to [facility] for admission by the  [facility].  She was maintained on Lasix until earlier today when  that was discontinued.  She notes that she has had no further orthopnea.  She still feels short of breath with walking in a room.  She has had no  recurrent chest discomfort.   Of note, the patient has been anemic with a H and H of 8.8 and 26,  respectively.  In February of this year her H and H was 11.7 and 34.4.  Her fecal occult blood is negative and B12, ferritin  and folate are all  normal.   ALLERGIES:  THE PATIENT DEVELOPS NAUSEA AND VOMITING WITH IV CONTRAST.  HOWEVER, THIS WAS AVOIDED BY PRETREATING HER WITH ZOFRAN PRIOR TO HER CT  SCAN THE OTHER DAY.  SHE DEVELOPS A RASH WITH OXYCODONE.  SHE IS  INTOLERANT TO PAXIL, LAMICTAL AND COMBIVENT.   CURRENT MEDICATIONS:  1. Protonix 40 mg daily.  2. Aldomet 500 mg b.i.d.  3. Norvasc 10 mg daily.  4. Aspirin 81 mg daily.  5. Lantus 50 units q a.m. and nightly.  6. Heparin 5000 units subcutaneous q.8h.  7. Sliding scale insulin t.i.d. a.c.  8. Nu-Iron 150 mg b.i.d.  9. Crestor 20 mg daily.   FAMILY HISTORY:  Mother died at age 45 from an MI and also was diabetic  with hypertension.  Her father is alive at age 5 with COPD/asthma.  He  is a previous smoker.  There is no history of heart disease.  She has no  siblings.   SOCIAL HISTORY:  She lives in [place] with her fiance.  She works as  post Geologist, engineering.  She denies tobacco or drug  use.  She will have an  alcoholic beverage one or two times per year.  She is not routinely  exercising.   REVIEW OF SYSTEMS:  Positive for chest pain, dyspnea on exertion, as  well with rest.  Orthopnea over the past week.  She has had edema of her  ankles and feet over the past week.  She has a history of GI episode  with nausea and vomiting, although nothing recent.  She has chronic  constipation having a bowel movement every 3-4 days.  She has a long  history of diabetes.  Otherwise, all systems appear negative.   She is a full code.   PHYSICAL EXAMINATION:  Temperature 98.5, heart rate 74, respirations 16,  blood pressure 148/79, pulse ox 98% on room air.  A pleasant African American female in no acute distress.  Awake, alert  and oriented times three.  HEENT:  Normal.  NEURO:  Grossly intact and nonfocal.  SKIN:  Warm and dry without lesions or masses.  MUSCULOSKELETAL:  Grossly normal.  PSYCH:  Normal affect.  NECK:  Supple. No bruits, JVD.  LUNGS:  Respirations are regular and unlabored.  Clear to auscultation.  CARDIAC:  Regular S1 and S2.  No S3 and S4 or murmur.  ABDOMEN:  Obese, soft, nontender.  Bowel sounds present times four.  EXTREMITIES:  Warm, dry.  No clubbing, cyanosis, or edema.  Dorsalis  pedis and posterior tibial pulses 2+ and equal bilaterally.  No femoral  bruits noted.   The chest CT from [place] [facility], showed no PE and suspect CHF.  Chest x-ray  from [place] [facility], showed minimal bronchitic changes.  EKG shows sinus  rhythm, normal axis, rate of 78, poor R wave progression.  Hemoglobin  8.8, hematocrit 26, WBC 7.6, platelets 372.  Sodium 140, potassium 4.2,  chloride 112, CO2 of 21, BUN 25, creatinine 1.16, glucose 81.  Total  bilirubin 0.4, alkaline phosphatase 112, AST 18, ALT less than 6, total  protein 7.4, albumin 3.3.  TSH 2.462.  Hemoglobin A1c 8.2.  BNP 38.5  down from 119.  Cardiac markers were negative x3.  B12 of 427, ferritin  69, folate 11.7.   Beta-hCG was negative.  Calcium 8.6.  Reticulocyte  count 45.8.  Occult blood was negative.  Total cholesterol 278,  triglycerides 115, HDL 23, LDL of 227.  ASSESSMENT/PLAN:  1. Unstable angina.  The patient presents with a 1 week history of      progressive dyspnea on exertion now occurring at rest, culminating      in an episode of exertional chest tightness and arm pain.  The      symptoms lasted approximately 3 hours on the day of admission and      were better with rest.  ECK is nonacute and cardiac markers were      negative.  Echocardiogram showed normal left ventricular function      with an ejection fraction of 60%, mildly dilated left atrium.  A      chest CT was negative for pulmonary embolism.  The symptoms are      concerning for angina, especially in light of her risk factors.  We      will increase her aspirin to 325 mg daily.  Continue statin.  Plan      catheterization in the a.m.  2. Hypertension.  Blood pressure is up this morning and she is      currently treated with Norvasc and Aldomet.  Of note, she was      previously on lisinopril/hydrochlorothiazide which she came off      relatively recently as she was attempting to become pregnant.  I      discussed this with her and recommended that angiotension-      converting enzyme inhibitor be reinitiated at some point as she      would greatly benefit given her diabetes and proteinuria.  At this      time, we will defer reinitiation of angiotension-converting enzyme      inhibitor to her outpatient primary care Ramiel Forti.  3. Hyperlipidemia.  LDL is 227, agree with Crestor 40.  4. Acute diastolic congestive heart failure.  Question if congestive      heart failure may be related to ischemia.  BNP was only mildly      elevated, however improved with Lasix and she also had symptomatic      improvement.  She had no further orthopnea.  In light of normal      ejection fraction, we will focus on blood pressure and heart  rate      management.  5. Proteinuria.  As above, add back angiotension-converting enzyme      inhibitor at patient's discretion.  6. Normocytic anemia.  There is also normochromic.  B12, folate and      ferritin are normal.  Fecal occult blood was negative.  Her H and H      is down fairly significantly from H and H in February of 2010.      Defer to internal medicine.  It certainly could be contributing to      symptoms.      Nicolasa Ducking, ANP      Doylene Canning. [name], MD  Electronically Signed    CB/MEDQ  D:  05/01/2008  T:  05/01/2008  Job:  [id number]

## 2010-06-09 NOTE — H&P (Signed)
NAMEMarland Kitchen  Jasmine [name], Jasmine [name] NO.:  [id number]   MEDICAL RECORD NO.:  [id number]         PATIENT TYPE:  CINP   LOCATION:                               FACILITY:  [facility]   PHYSICIAN:  Hillery Aldo, M.D.   DATE OF BIRTH:  09-21-1970   DATE OF ADMISSION:  11/09/2006  DATE OF DISCHARGE:                              HISTORY & PHYSICAL   PRIMARY CARE PHYSICIAN:  Dr.  Duanne Guess at [facility].   GASTROENTEROLOGIST:  Jordan Hawks. Elnoria Howard, MD   CHIEF COMPLAINT:  Nausea, vomiting, abdominal pain.   HISTORY OF PRESENT ILLNESS:  The patient is a 40 year old female who  developed abdominal pain on October 24, 2006 for which she presented  to the emergency department.  She had a headache at that particular  visit as well.  She was put on a combination of Ultram for headache  pain, and Bactrim to treat a urinary tract infection..  Of note, no  urine cultures were sent during that visit.  She represented to the  emergency department on November 01, 2006 with a chief complaint of  abdominal pain.  She was treated with a combination of acid suppressing  medications, and was continued on Septra for treatment of a urinary  tract infection.  Urine cultures were obtained, subsequently grew out a  pan resistant Escherichia coli bacteria, that was resistant to Septra.  The patient presents, again, today with complaints of ongoing abdominal  pain which is described as midepigastric, and left lower quadrant  radiating around to her left flank area.  She was seen yesterday in the  emergency department, and was put on Keflex; however, over the past 24  hours, she has developed nausea, vomiting, and diarrhea and is unable to  keep her medications down.  She denies any hematemesis.  The patient  states that in the course of all these symptoms she did go and see Dr.  Elnoria Howard as an outpatient, and was noted during his exam to have heme-  positive stools.  He was going to set her up with an abdominal and  pelvic  CAT scan; however, before this could be done, she presents back  here in the emergency department.  At this time, she continues to have  epigastric, left lower quadrant, and left-sided flank pain.  We are  admitting her for further evaluation of her abdominal pain and treatment  with IV antibiotics for her known urinary tract infection.   PAST MEDICAL HISTORY:  1. Hypertension.  2. Diabetes.  3. Bipolar disorder with depression and history of medication overdose      and a suicide attempt.  4. Status post bilateral carpal tunnel release.   FAMILY HISTORY:  The patient's mother died at 56 from complications of  an MI.  She also was diabetic and bipolar.  The patient's father is  alive and suffers with asthma.  She has one healthy sister.  She has no  offspring.   SOCIAL HISTORY:  The patient is single and lives with her fiance.  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  She  denies tobacco.  She drinks  alcohol approximately two times per year.  She denies drug use.   ALLERGIES:  OXYCODONE causes pruritus.  COMPAZINE causes a dystonic  reaction.   CURRENT MEDICATIONS:  1. Lantus 60 units subcutaneously nightly.  2. Sliding scale insulin with NovoLog q.a.c.  3. Ramipril 10 mg daily.  4. Clonidine 0.1 mg b.i.d.  5. Carafate 1 gram b.i.d.   REVIEW OF SYSTEMS:  The patient denies any fever or chills.  She has had  diminished appetite.  Again, she complains of stomach pain for the past  2 weeks, accompanied by nausea, and now vomiting over the past 24 hours.  Pain is crampy.  When it first came on 2 weeks ago, the pain decreased  with food.  Over the past several days, meals have made the pain worse,  however.  She denies shortness of breath or cough.  She has occasional  dysuria with a pressure-like sensation.  She denies hematuria.  Her  urine has been foul smelling.   PHYSICAL EXAM:  VITAL SIGNS:  Temperature 98.6,  pulse 86, respirations  18, blood pressure 160/83, O2  saturation 100% on room air.  GENERAL:  This is an obese female in no acute distress.  HEENT:  Normocephalic, atraumatic.  PERRL.  EOMI.  Oropharynx is clear.  NECK:  Supple, no thyromegaly, no lymphadenopathy, no jugular venous  distention.  CHEST:  Lungs clear to auscultation bilaterally with good air movement.  HEART:  Regular rate, rhythm.  No murmurs, rubs or gallops.  ABDOMEN:  Soft.  She is tender diffusely, but no rebound or guarding.  Bowel sounds are present in all four quadrants.  GENITOURINARY:  The patient has left-sided CVA tenderness.  EXTREMITIES:  No clubbing, edema, or cyanosis.  SKIN:  Warm and dry, no rashes.  NEUROLOGIC:  The patient is alert and oriented x3.  Cranial nerves II-  XII grossly intact, they are nonfocal.   DATA REVIEW/LABORATORY DATA:  A white blood cell count 11.3, hemoglobin  12.9, hematocrit 37, platelets are 391.  Sodium 138, potassium 4.6,  chloride 106, bicarb 23, BUN 15, creatinine 0.99, glucose is 134.  Urinalysis is negative for nitrites and leukocyte esterase, there are 0-  2 red blood cells and rare bacteria.   ASSESSMENT/PLAN:  1. Recurrent abdominal pain in the setting of recent urinary tract      infection.  The patient may have a urinary tract infection that has      progressed to pyelonephritis.  She has failed outpatient therapy;      and, therefore, we are admitting her putting her on IV Rocephin.      Given her epigastric abdominal pain and history of heme-positive      stool, I will check a CT scan of the abdomen and pelvis to rule out      an alternative intraoperative pathology.  I will administer pain      medications p.r.n. and antiemetics p.r.n..  2. Diabetes:  Check the patient's hemoglobin A1c to determine her      overall glycemic control, and continue her Lantus and sliding scale      insulin.  3. Nausea/vomiting.  Will continue antibiotics, and put her on proton      pump inhibitor therapy.  Will check a Helicobacter  pylori antibody.      We will also check her liver function studies and a lipase level      given her concurrent abdominal pain.  Will monitor  her clinical      course closely and consider further diagnostic testing if      indicated.  4. Hypertension.  Continue the patient's usual antihypertensives.  5. Prophylaxis.  Will initiate GI prophylaxis with Protonix and DVT      prophylaxis with early ambulation and PAS hoses while in bed.      Hillery Aldo, M.D.  Electronically Signed     CR/MEDQ  D:  11/09/2006  T:  11/10/2006  Job:  [id number]   cc:   Jordan Hawks. Elnoria Howard, MD  Fax: [phone]

## 2010-06-09 NOTE — Op Note (Signed)
NAMEDORREEN, VALIENTE                ACCOUNT NO.:  [id number]   MEDICAL RECORD NO.:  [id number]          PATIENT TYPE:  AMB   LOCATION:  SDS                          FACILITY:  [facility]   PHYSICIAN:  Cherylynn Ridges, M.D.    DATE OF BIRTH:  December 23, 1970   DATE OF PROCEDURE:  01/12/2008  DATE OF DISCHARGE:                               OPERATIVE REPORT   PREOPERATIVE DIAGNOSIS:  Symptomatic biliary dyskinesia.   POSTOPERATIVE DIAGNOSES:  Symptomatic biliary dyskinesia with moderate  chronic cholecystitis near the infundibulum.   PROCEDURE:  Laparoscopic cholecystectomy with cholangiogram.   SURGEON:  Cherylynn Ridges, MD   ASSISTANT:  Ardeth Sportsman, MD   ANESTHESIA:  General endotracheal.   ESTIMATED BLOOD LOSS:  Less than 20 mL.   COMPLICATIONS:  None.   CONDITION:  Stable.   FINDINGS:  The patient had moderate chronic cholecystitis in the body  and infundibulum with omental adhesions to the external portion of the  gallbladder wall and severe inflammation in the infundibulum medially  towards the common bile duct and involving the cystic duct.   INDICATION FOR OPERATION:  The patient is a 40 year old diagnosed of  biliary dyskinesia with gallbladder ejection fraction of 43% who now  comes in for an elective laparoscopic cholecystectomy.   OPERATION:  The patient was taken to the operating room and placed on  table in the supine position.  After an adequate general and  endotracheal anesthetic was administered, she was prepped and draped in  usual sterile manner exposing both the midline and the right upper  quadrant.   A supraumbilical midline incision was made using #15 blade and taken  down to midline fascia which was subsequently grabbed with 2 Kocher  clamps.  We incised between the 2 Kocher clamps with the #15 blade into  the preperitoneal space, enlarged with the Kelly clamp, and subsequently  after we penetrated the peritoneal cavity, passed a pursestring suture  of 0  Vicryl around the fascial opening.  This pursestring suture was  used to secure an Hasson cannula which is subsequently passed through  the fascial opening into the peritoneal cavity.  Once the Hasson cannula  was in place, we insufflated carbon dioxide gas into the peritoneal  cavity up to a maximal intraabdominal pressure of 15 mmHg.   Once the gas was insufflated, 2 right subcostal 5-mm cannula and a  subxiphoid 11 mm cannula was passed under direct vision.  The patient  was placed in reverse Trendelenburg, the left side was tilted down, and  the dissection began.   The dome of the gallbladder was free but was underneath the edge of the  gallbladder and could be retracted above it.  The adhesions of omentum  to the body and infundibulum of the gallbladder which were dissected  free using electrocautery and blunt dissection.  There was some  difficulty mobilizing the gallbladder laterally as we usually do for  retraction because of moderate inflammatory response on the medial  aspect of the gallbladder.  We did dissect this away and eventually able  isolate off the cystic duct  and cystic artery along the peritoneum  overlying the triangle of Calot and hepatoduodenal triangle.  There was  also a large artery going along with the gallbladder lymph node which  was dissected free and pushed medially.   We isolated the cystic duct and the cystic artery, placed a clip along  the gallbladder at the side of the cystic duct.  We made  cholecystodochotomy using a laparoscopic scissor.  We then performed a  cholangiogram using a Cook catheter which have been passed through the  anterior abdominal wall.  The cholangiogram showed good flow into the  duodenum, no ductal dilatation, good portion of cystic duct remnant,  good proximal filling, and no filling defects.   Once the cholangiogram was completed, we removed the clips securing the  catheter in place and distally clipped the cystic duct x3  and then  transected the cystic duct.  We isolated the cystic artery, clipped it  approximately x2 and distally x1, then transected it.  We then dissected  out the gallbladder from its bed with minimal difficulty.  Once the  gallbladder was removed, there was minimal from the bed.  We irrigated  with saline solution.  Once this was done, we aspirated all fluid and  gas from the above the liver and they removed all cannulas.   The pursestring suture was used to close out the supraumbilical fascia.  Once this was done, we removed all of the other cannulas and we injected  0.25% Marcaine with epinephrine at all sites.  We then closed the  supraumbilical and subxiphoid sites with running subcuticular suture of  4-0 Monocryl.  We reinforced that with Dermabond.  The other 2 sites  were closed with Dermabond, Steri-Strip, and Tegaderm.  All counts were  correct.      Cherylynn Ridges, M.D.  Electronically Signed     JOW/MEDQ  D:  01/12/2008  T:  01/13/2008  Job:  [id number]   cc:   Jordan Hawks. Elnoria Howard, MD

## 2010-06-12 NOTE — Discharge Summary (Signed)
NAMEMARIGNY, BORRE NO.:  [id number]   MEDICAL RECORD NO.:  [id number]          PATIENT TYPE:  IPS   LOCATION:  0300                          FACILITY:  [facility]   PHYSICIAN:  Jeanice Lim, M.D. DATE OF BIRTH:  03/06/1970   DATE OF ADMISSION:  10/01/2004  DATE OF DISCHARGE:  10/05/2004                                 DISCHARGE SUMMARY   IDENTIFYING DATA:  This is a 40 year old African-American female, single,  voluntarily admitted with a history of manic episodes, decreased sleep.  Previously diagnosed bipolar.  Had been depressed for the past year and a  half.  Episodes of mania.  Previously developed suicidal thoughts and held  bottle of trazodone in hand and then dissuaded by thoughts of that her  father would be disappointed.  Out of work due to mood.  Had ran up debt of  $65,000 along with losing condo.  Not taking medications due to financial  issues.  Overwhelmed.  Intrusive thoughts.  Followed up by Dr. Evelene Croon.  A  second [facility] admission.  Initially diagnosed with  depression in 2002 at [facility].  Followed by Dr. Evelene Croon and  was going to discontinue Celexa and switch to Cymbalta.  Positive legal  charges for bad checks and is bankrupt.  A graduate in psychology from  [employer].  Never married.  Will lose home in three months.  Denied  alcohol and drug history.   MEDICATIONS:  Had only been compliant with Geodon.   ALLERGIES:  LAMICTAL which caused headaches and PAXIL and OXYCODONE.   PHYSICAL EXAMINATION:  Physical and neurologic exam within normal limits.   LABORATORY DATA:  Routine admission labs within normal limits.   MENTAL STATUS EXAM:  Fully alert, cooperative, some blunting.  Speech within  normal limits.  Mood depressed, overwhelmed, hopeless, helpless and  worthlessness.  Thought processes positive for suicidal ideation, vague  without plan.  No other psychotic symptoms.  Cognitively intact.  Judgment  and insight were impaired.   ADMISSION DIAGNOSES:  AXIS I:  Bipolar disorder, type 1, mixed episode.  AXIS II:  None.  AXIS III:  Diabetes mellitus, type 2.  AXIS IV:  Severe (financial and legal problems, economic issues and limited  support system).  AXIS V:  30/60.   HOSPITAL COURSE:  The patient was admitted and ordered routine p.r.n.  medications and underwent further monitoring.  Was encouraged to participate  in individual, group and milieu therapy.  The patient was placed on safety  checks.  EKG obtained.  Monitored medically.  Celexa decreased.  Wellbutrin  decreased initially and then Altace and Geodon were to be continued.  The  patient reported clear mood swings and was stabilized on Depakote.  Medications were optimized.  The patient reported a positive response.  Mood  and sleep improved.   CONDITION ON DISCHARGE:  The patient was discharged in improved condition,  euthymic with no mood swings.  No dangerous ideation.  No psychotic  symptoms.  No side effects from medications.  The patient was given  medication education and importance of  compliance was emphasized.  The  patient was given a three-day supply of medications and after medication  education reviewed again.   DISCHARGE MEDICATIONS:  1.  Labetalol 100 mg b.i.d.  2.  Altace 10 mg q.a.m.  3.  Geodon 120 mg, taking 2 60 mg with supper since it needs to be taken      with food.  4.  Celexa 20 mg in the morning.  5.  Wellbutrin 300 mg XL q.a.m.  6.  Trazodone 50 mg q.h.s. p.r.n. insomnia.  7.  Ambien 10 mg q.h.s. p.r.n. insomnia.  8.  Depakote ER 500 mg q.h.s.  9.  Insulin Lantus 70 units at bedtime every night, to take as previously      prescribed.   FOLLOW UP:  To follow up with primary care physician regarding diabetes and  other medical issues.  The patient was written for a Glucometer and was to  go to [facility] in one week for follow-up regarding metabolic  issues and to keep record of  glucose for at least one month.  The patient  had follow-up appointments with Dr. Evelene Croon and __________ arranged for  September 12th at 2:15 and September 13th at 10 a.m., respectively.   DISCHARGE DIAGNOSES:  AXIS I:  Bipolar disorder, type 1, mixed episode.  AXIS II:  None.  AXIS III:  Diabetes mellitus, type 2.  AXIS IV:  Severe (financial and legal problems, economic issues and limited  support system).  AXIS V:  GAF on discharge 55-60 and condition was improved.      Jeanice Lim, M.D.  Electronically Signed     JEM/MEDQ  D:  11/15/2004  T:  11/15/2004  Job:  [id number]

## 2010-06-12 NOTE — H&P (Signed)
Jasmine [name], CISAR NO.:  [id number]   MEDICAL RECORD NO.:  [id number]          PATIENT TYPE:  EMS   LOCATION:  ED                           FACILITY:  [facility]   PHYSICIAN:  Renato Battles, M.D.     DATE OF BIRTH:  March 19, 1970   DATE OF ADMISSION:  09/27/2004  DATE OF DISCHARGE:                                HISTORY & PHYSICAL   REASON FOR ADMISSION:  Hyperglycemia.   PRIMARY CARE PHYSICIAN:  Unassigned.   HISTORY OF PRESENT ILLNESS:  The patient is a 40 year old African-American  female who was admitted to [facility] for evaluation and management  of depression and suicidal ideation, and during that time she was sent to  [facility] Emergency Room for medical clearance. She was found to have  hyperglycemia with blood sugar over 400.  She was started on Glucommander in  the emergency department, but despite being on Glucommander for over three  hours, her blood sugars continued to be over 300.  She reports that she has  not been taking her insulin for the last six months.  At this time the  patient has no complaints, she is being admitted for better management of  her hyperglycemia and to medically stabilize her for admission to [facility].   REVIEW OF SYSTEMS:  CONSTITUTIONAL SYMPTOMS:  No fevers, chills, or night  sweats.  No weight changes.  GI:  No nausea, vomiting, no diarrhea or  constipation.  GU:  No dysuria, hematuria, or retention.  CARDIOPULMONARY:  No chest pain, no orthopnea or PND, no cough.   PAST MEDICAL HISTORY:  1.  Bipolar disorder.  2.  Type 2 diabetes, insulin requiring.  3.  Hypertension.   PAST SURGICAL HISTORY:  Bilateral carpal tunnel surgery.   FAMILY HISTORY:  Positive for diabetes, hypertension, and CAD.   SOCIAL HISTORY:  No tobacco or drugs.  Occasional alcohol.  She works in the  post office.  She has a boyfriend who does not live with her, she has no  children.   ALLERGIES:  OXYCODONE.   CURRENT  MEDICATIONS:  1.  Altace 10 mg p.o. daily.  2.  Lantus 70 units subcu q.h.s.  3.  Humalog insulin 20 units to 30 units q.a.c.  4.  Wellbutrin XL 450 mg daily.  5.  Celexa 40 mg p.o. daily.  6.  Geodon 80 mg p.o. q.h.s.   PHYSICAL EXAMINATION:  GENERAL:  No acute changes.  The patient has flat  affect.  VITAL SIGNS:  Temperature 98.1, heart rate 105, respiratory rate 20, blood  pressure 153/97.  HEENT:  The head is atraumatic, normocephalic.  Pupils are equal, round and  reactive to light and accommodation.  NECK:  No lymphadenopathy, no thyromegaly, no JVD.  CHEST:  Clear to auscultation bilaterally, no wheezing, rales or rhonchi.  HEART:  Regular rate and rhythm, no murmurs, gallops or rubs.  ABDOMEN:  Soft, nontender, nondistended, normoactive bowel sounds.  EXTREMITIES:  No cyanosis, clubbing or edema.   STUDIES:  Drug screen was negative.  Alcohol level was undetectable.  BMP  showed  sodium 131, glucose 443, otherwise all within normal.   IMPRESSION:  1.  Poorly controlled diabetes.  2.  Severe hyperglycemia.  3.  Hypertension.  4.  Depression with suicidal ideation.  5.  Hyponatremia most likely pseudohyponatremia as a result of her extreme      hyperglycemia.   PLAN:  1.  Admit to the hospital.  2.  Start on Glucommander.  3.  Continue insulin Lantus and resume insulin Humalog tomorrow.  4.  American Diabetic Association diet.  5.  Suicidal ideation at the highest level.  6.  Continue home medications for hypertension and depression.  7.  Obtain psychiatric consult for placing the patient back in Behavioral      Health.      Renato Battles, M.D.  Electronically Signed     SA/MEDQ  D:  09/27/2004  T:  09/27/2004  Job:  [id number]
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
## 2010-06-12 NOTE — H&P (Signed)
[name], [name] NO.:  [id number]   MEDICAL RECORD NO.:  [id number]          PATIENT TYPE:  INP   LOCATION:  0102                         FACILITY:  [facility]   PHYSICIAN:  Merlene Laughter. Renae Gloss, M.D.DATE OF BIRTH:  [birth year]   DATE OF ADMISSION:  03/23/2006  DATE OF DISCHARGE:                              HISTORY & PHYSICAL   PRIMARY CARE PHYSICIAN:  Dr. Duanne Guess, [facility].   ENDOCRINOLOGIST:  Dr. Romero Belling.   PSYCHIATRIST:  Dr. Milagros Evener.   CHIEF COMPLAINT:  Overdose.   HISTORY OF PRESENT ILLNESS:  Jasmine [name] is a 40 year old lady who  presents today after taking about 1500 mg of trazodone. She states this  was a partial suicide attempt and also an attempt to gain attention from  her boyfriend with whom she was arguing with earlier today. She arrived  the emergency room about 30 minutes after ingesting the trazodone and  received charcoal therapy. She was initially noted to be lethargic,  however, over the past several hours she has almost fully returned to  her baseline mental status. There were no other acute constitutional or  systemic concerns at this time.   PAST MEDICAL HISTORY:  Type 2 diabetes, hypertension, bipolar disorder.   FAMILY HISTORY:  Significant for type 2 diabetes and hypertension in  mother.   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 She  denies tobacco, alcohol or drug abuse.   ALLERGIES:  OXYCODONE which causes rash.   MEDICATIONS:  1. Wellbutrin 300 mg p.o. daily.  2. Neurontin 100 mg p.o. b.i.d.  3. Lamictal 150 mg p.o. daily.  4. Altace 10 mg p.o. daily.  5. Clonidine 0.1 mg p.o. b.i.d.  6. Lantus 60 units subcu q.h.s.  7. NovoLog 2 units q.a.c. breakfast and dinner, 5 units q.a.c lunch.   PHYSICAL EXAMINATION:  GENERAL:  Well-developed, well-nourished female  in no acute distress.  VITAL SIGNS:  Temperature 98.1, respirations 18, heart rate 114, blood  pressure 142/64. O2 saturations  on room air 100%.  HEENT:  No oropharyngeal lesions.  NECK:  Supple, no masses, 2+ carotids, no bruits.  LUNGS:  Clear to auscultation bilaterally.  HEART:  S1, S2, regular rate and rhythm, no murmurs, rubs or gallops.  ABDOMEN:  Soft, nontender, nondistended, positive bowel sounds.  EXTREMITIES:  No clubbing, cyanosis or edema.  NEUROLOGIC:  Alert and oriented x3. Cranial nerves intact.   ASSESSMENT/PLAN:  1. Bipolar depression with suicide attempt. Jasmine [name] is returning to      her baseline mental status post a trazodone injection. She appears      not to have any residual consequences from the ingestion. However,      we will continue observation overnight on telemetry to ensure      medical stabilization and clearance for transfer to psychiatric      service. Dr. Jeanie Sewer has been notified.  2. Hypertension. Jasmine [name] will be continued on her outpatient      medical regimen, however, her medications will be adjusted if her      blood  pressure remains elevated.  3. Type 2 diabetes mellitus. Jasmine [name] outpatient regimen will be      continued. Hemoglobin A1c will be obtained and further adjustments      will be made during hospitalization if needed.           ______________________________  Merlene Laughter Renae Gloss, M.D.     KRS/MEDQ  D:  03/23/2006  T:  03/23/2006  Job:  [id number]

## 2010-06-12 NOTE — Consult Note (Signed)
Jasmine [name], Jasmine [name] NO.:  [id number]   MEDICAL RECORD NO.:  [id number]          PATIENT TYPE:  INP   LOCATION:  1435                         FACILITY:  [facility]   PHYSICIAN:  Antonietta Breach, M.D.  DATE OF BIRTH:  1970-05-03   DATE OF CONSULTATION:  03/24/2006  DATE OF DISCHARGE:  03/24/2006                                 CONSULTATION   REASON FOR CONSULTATION:  Overdose.   REQUESTING PHYSICIAN:  Madaline Savage, MD.   HISTORY OF PRESENT ILLNESS:  Jasmine [name] is a 40 year old female  admitted to the [facility] on March 23, 2006, due to an  overdose of 1500 mg of trazodone.   Jasmine [name] stated at the time of assessment by Dr. Renae Gloss that this  was a partial suicide attempt and also an attempt to gain attention from  her boyfriend with whom she was arguing that day.   She has recovered from the initial sedation of the trazodone. She does  acknowledge that for that brief period of time prior to the overdose she  was having some suicidal thoughts.  However, she quickly realized after  the overdose that she had made a mistake and had an appropriate  constructive regret for the action. She realizes that she has become too  psychologically vulnerable and has been affected too much by the  instability of support in her relationship. She has the goal of  improving her internal strength independent of her boyfriend's support.  She has no thoughts of harming herself now. She has no thoughts of  harming others.  She has no hallucinations or delusions.  She is  oriented to all spheres.  Her memory is intact, and she is socially  appropriate.  She describes normal interests in the future as well as  hope. She does not have any hallucinations or delusions   PAST PSYCHIATRIC HISTORY:  Jasmine Millican does describe experiences of  several days which have involved a decreased need for sleep as well as  racing thoughts, excessive spending, impaired judgment,  expansive and  high mood.   She has never attempted suicide before. She does have a history of  multiple psychiatric admissions.  She was last psychiatrically admitted  in December 2007. At that time she was suffering from depression. Her  past psychotropic trials include Wellbutrin, Lamictal and trazodone   The patient remarks that she has been stable on her Wellbutrin and  Lamictal and that she was having some reactive symptoms regarding  disagreements with her boyfriend.   FAMILY PSYCHIATRIC HISTORY:  The patient's mother is estimated to have  bipolar disorder.   SOCIAL HISTORY:  Marital status:  Single. Children:  None.  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 Concerning legal difficulties, she did write worthless  checks during manic experiences and is still on probation for the  dilations.   She does not use any alcohol or illegal drugs.  She does not use  tobacco.   GENERAL MEDICAL PROBLEMS:  1. Type 2  diabetes mellitus.  2. Hypertension.   MEDICATIONS:  The MAR is reviewed.  1. Wellbutrin XL 300 mg daily.  2. Lamictal 150 mg daily.   ALLERGIES:  PAROXETINE,  OXYCODONE.   LABORATORY DATA:  WBC 9.5 hemoglobin 12.1, platelet count 303. INR 1.0.  BUN 27, creatinine 0.88, SGOT 15, SGPT 12, albumin 2.7, calcium 8.8.  Tylenol level was less than 10. Hemoglobin A1c was 10.8, which is high.  Urine drug screen was negative. Alcohol was negative.   REVIEW OF SYSTEMS:  CONSTITUTIONAL:  Afebrile.  HEAD:  No trauma.  EYES:  No visual changes.  EARS:  No hearing impairment. NOSE:  No rhinorrhea.  MOUTH/THROAT:  No sore throat.  NEUROLOGIC:  Unremarkable.  No history  of seizures. PSYCHIATRIC:  As above CARDIOVASCULAR:  No chest pain,  palpitations or edema.  RESPIRATORY:  No coughing or wheezing.  GASTROINTESTINAL:  No nausea, vomiting, diarrhea.  GENITOURINARY:  No  dysuria.  SKIN:  Unremarkable.  HEMATOLOGIC/LYMPHATIC:  Unremarkable.  MUSCULOSKELETAL:  No deformities, weaknesses or atrophy.  ENDOCRINE/METABOLIC:  As above.   VITAL SIGNS:  Temperature 98.6, pulse 93, respiration 20, blood pressure  147/89, O2 saturation on room air 99%.   MENTAL STATUS EXAM:  Jasmine [name] is a 40 year old female appearing her  chronologic age, well groomed, partially reclined in a supine position  in her hospital bed with good eye contact.  She is socially appropriate.  Her psychomotor tone is generally within normal limits.  Her speech  involves normal rate and prosody. She is oriented to all spheres.  Her  fund of knowledge and intelligence are within normal limits. Memory is  intact completely to immediate, recent and remote.  Thought process  logical, coherent, goal directed.  No looseness of associations.  Thought content:  No thoughts of harming herself, no thoughts of harming  others, no delusions, no hallucinations.  Please see the History of  Present Illness. She has solid hope and interest. Affect is broad  appropriate.  Insight is good. Judgment is intact. She is well groomed.   ASSESSMENT:  AXIS I:  (293.83) Mood disorder not otherwise specified.  The patient likely has a diagnosis of bipolar I disorder, depressed, in  partial remission. She appears to have had some reactive symptoms  regarding the relationship with her boyfriend. These have been short  lived.  AXIS II:  Deferred.  AXIS III:  See General Medical Problems.  AXIS IV:  Primary support group.  AXIS V:  55.   Ms. Mishkin is no longer at risk to harm herself or others.  She agrees  to call emergency services for any thoughts of harming herself, thoughts  of harming others, racing thoughts or distress.   The indications, alternatives and adverse effects of Wellbutrin and  Lamictal were reviewed with the patient including the risk of Wellbutrin seizures and Lamictal lethal rash. She understands above information and  wants to proceed with  Wellbutrin for antidepression as well as Lamictal  for mood stabilization and mania prevention.   The undersigned provided ego supportive psychotherapy and education.   RECOMMENDATIONS:  1. In order to provide the most intensive treatment, an inpatient      psychiatric admission was recommended; however, the patient      declined, and she is not committable now that she has recovered      from her acute symptoms.  She opts for the intensive outpatient      program at one of the local psychiatric hospitals available  such as      [facility], [place] or [facility]  2. She will continue on her Wellbutrin 300 mg XL p.o. q.a.m. for anti      depression.  3. She will continue on Lamictal 150 mg daily as her mood stabilizer.  4. The case manager will facilitate her in rolling immediately in an      intensive outpatient program at one of the facilities mentioned      above.      Antonietta Breach, M.D.  Electronically Signed     JW/MEDQ  D:  03/26/2006  T:  03/26/2006  Job:  [id number]

## 2010-06-12 NOTE — H&P (Signed)
NAME:  Jasmine [name], Jasmine [name] NO.:  [id number]   MEDICAL RECORD NO.:  [id number]          PATIENT TYPE:  INP   LOCATION:  0111                         FACILITY:  [facility]   PHYSICIAN:  Sherin Quarry, MD      DATE OF BIRTH:  1970/06/11   DATE OF ADMISSION:  03/04/2005  DATE OF DISCHARGE:                                HISTORY & PHYSICAL   HISTORY OF PRESENT ILLNESS:  Jasmine [name] is a 40 year old lady with a 10-  year history of diabetes who presents to [facility] at this time  with a several-day history of excessive thirst, urinary frequency, dysuria  and malaise. She indicates that she has not been taking her insulin  medication for approximately the last 6 weeks. She does not measure her  blood sugars. She has not been taking any other medications as well. There  have been no fevers or chills. No headache, no breathing difficulty or chest  pain, no nausea or vomiting until she came to the emergency room. Since that  time she has had one episode of vomiting. On arrival to the emergency room  her temperature was noted to be 99, blood pressure 156/96, pulse 122,  respirations 20.   Initial laboratory studies obtained included white count 11,900. Her sodium  was 129, blood sugar was 486. Liver profile was within normal limits. Urine  pregnancy test was negative. Amylase was 19. Urinalysis revealed marked  pyuria and bacteriuria, glucosuria was also noted as well as proteinuria.  [name] is admitted to the hospital at this time for management of a  urinary tract infection as well as uncontrolled diabetes.   PAST MEDICAL HISTORY:  [name] has not been taking any medicines  recently. Her normal medications  consist of Lantus insulin 70 units at  bedtime daily, Altace 10 mg daily, Depakote 500 mg daily which she says was  discontinued by her psychiatrist in December.  Celexa 20 mg daily which she  states was switched to Wellbutrin 300 mg daily again in December,  and Geodon  120 mg daily.   ALLERGIES:  OXYCODONE, LAMICTAL.   MEDICAL ILLNESSES:  1.  Bipolar disorder. She states that she has been diagnosed by a      psychiatrist with bipolar disorder. She does not take any medications      consistently. She states that when she takes the antidepressant medicine      and Geodon or Depakote, it seems to help her mood swings. She denies      suicidal ideation.  2.  Diabetes see above.  3.  Hypertension.   FAMILY HISTORY:  Her sister is in good health. Her mother died as result of  complications of diabetes at age 2, apparently she had a myocardial  infarction. Her father has asthma.   SOCIAL HISTORY:  She does not smoke. She states that she does not abuse  drugs. She will occasionally drink alcohol but she says she is not drinking  alcohol recently. She lives with her boyfriend and is employed by the [employer]  [employer].   REVIEW OF  SYSTEMS:  HEAD:  She denies headache or dizziness. EYES:  She  denies visual blurring or diplopia. ENT:  Denies earache, sinus pain or sore  throat. CHEST:  Denies coughing wheezing or chest congestion.  CARDIOVASCULAR:  Denies orthopnea, PND or ankle edema. GI: She is currently  experiencing nausea, possibly as a result of pain medication, but had  no  symptoms of nausea prior to coming to the emergency room. She has not had  any hematemesis or melena. She is not complaining of abdominal pain. GU: See  above. There is been no hematuria. GYN she denies vaginal discharge or  bleeding. NEURO:  There is no history of seizure or stroke. ENDOCRINE: See  above.   PHYSICAL EXAMINATION:  GENERAL:  She is an obese lady who is quite  cooperative and is experiencing symptoms of nausea.  VITAL SIGNS:  Blood pressure is 156/96, pulse is 122, respirations 20, O2  saturation 98%.  HEENT:  Exam is within normal limits.  CHEST:  Clear.  BACK:  On examination the back there is no CVA or point tenderness  CARDIOVASCULAR:  Reveals  normal S1-S2 without gross murmurs or gallops.  ABDOMEN:  The abdomen is benign. No guarding or rebound.  NEUROLOGIC:  Testing examination of extremities is normal.   IMPRESSION:  1.  Probable pyelonephritis.  2.  Uncontrolled diabetes with noncompliance with medical advice.  3.  Proteinuria, possible history of hypertension.  4.  Bipolar disorder.   [name] will be admitted to the hospital for management of her  uncontrolled diabetes and urinary tract infection. It would appear that she  has excellent insurance and there should not be any problem with arranging  long-term care. The importance of adequately regulating her diabetes must be  emphasized. I will resume her Altace and medication for the bipolar  disorder. Blood and urine cultures have been requested. When her condition  has stabilized hopefully we can help her to arrange for outpatient follow-  up.           ______________________________  Sherin Quarry, MD     SY/MEDQ  D:  03/04/2005  T:  03/04/2005  Job:  [id number]

## 2010-06-12 NOTE — Consult Note (Signed)
Jasmine [name], Jasmine [name]                ACCOUNT NO.:  [id number]   MEDICAL RECORD NO.:  [id number]          PATIENT TYPE:  IPS   LOCATION:  0500                          FACILITY:  [facility]   PHYSICIAN:  Hollice Espy, M.D.DATE OF BIRTH:  06/26/1970   DATE OF CONSULTATION:  07/16/2005  DATE OF DISCHARGE:                                   CONSULTATION   REASON FOR CONSULTATION:  Management of diabetes and hyperglycemia.   HISTORY OF PRESENT ILLNESS:  The patient is a 40 year old African-American  female with past medical history of diabetes mellitus, type 2, but now has  been on insulin for many years.  She has a history of depression.  She tells  me that she last took her Lantus insulin back in [place].  She tells me that,  even when taking it, she had some episodes of hypoglycemia.  The patient  presented to [facility] on July 15, 2005 with depression and crying  and was admitted.  At the time of admission, she was noted to have her blood  sugars in the 400s.  Despite insulin coverage, her sugars have remained  elevated.  [facility] hospitalist were consulted for further management.  Currently, the patient states she is feeling a little bit better.  She knows  somewhat about her insulin regimen which consisted of Lantus 60 units at  night and NovoLog sliding scale throughout the day.  She is otherwise doing  well.  She has no complaints.  She denies any headaches, vision changes,  dysphagia, chest pain, palpitations, shortness of breath, wheeze, cough,  abdominal pain, hematuria, dysuria, constipation, diarrhea, focal extremity  numbness, weakness or pain.  Review of systems otherwise negative.   PAST MEDICAL HISTORY:  1.  Diabetes mellitus, type 2, but now on insulin.  2.  Hypertension.  3.  Depression.   MEDICATIONS:  For her diabetes, she is on Lantus 60 mg q.h.s. plus NovoLog  sliding scale.  She is also on Altace 5 mg p.o. daily as well as for  depression, she is on trazodone,  Lamictal and Wellbutrin currently.   ALLERGIES:  The patient is allergic to OXYCODONE.   SOCIAL HISTORY:  She denies any drug use, tobacco use and rarely alcohol.   FAMILY HISTORY:  Known for diabetes mellitus.   PHYSICAL EXAMINATION:  VITAL SIGNS:  The patient's most recent set of vitals  include temperature 98.5, respirations 16, blood pressure 170/99, heart rate  92.  GENERAL:  The patient is alert and oriented x3.  No apparent distress.  HEENT:  Normocephalic and atraumatic.  Mucous membranes are moist.  She has  no carotid bruits.  HEART:  Regular rate and rhythm.  S1, S2.  LUNGS:  Clear to auscultation bilaterally.  ABDOMEN:  Soft, nontender, nondistended, positive bowel sounds.  EXTREMITIES:  No clubbing, cyanosis, or edema.   LABORATORY DATA:  From admission, A1C is 13.5.  Urine drug screen negative.  TSH 1.53.  Urine micro unremarkable.  White count normal.  No shift.  Hemoglobin 13.5, hematocrit 39.6, platelets 319.  LFTs are unremarkable  except for albumin of  3.1 and glucose 405.   ASSESSMENT/PLAN:  1.  Uncontrolled diabetes mellitus.  Resume the patient's Lantus.  However,      will decrease the dose down to 50 units nightly.  Depending how her      blood sugars for the next 24-48 hours, we are to resume back to 60 mg of      her baseline dose or scale back further depending on if there are      episodes of hypoglycemia.  2.  Hypertension.  Also put her on a low dose beta blocker given her      elevated blood pressure regardless of starting her back on her ACE and      it is still elevated.  3.  Depression and anxiety.  Medications will be continued as per      psychiatry.      Hollice Espy, M.D.  Electronically Signed     SKK/MEDQ  D:  07/16/2005  T:  07/16/2005  Job:  [id number]

## 2010-06-12 NOTE — Discharge Summary (Signed)
[facility]  Patient:    Jasmine [name], Jasmine [name]                         MRN: [id number] Adm. Date:  [id number] Disc. Date: [id number] Attending:  Beryle Beams CC:         Vilinda Flake, Ph.D.   Discharge Summary  REASON FOR ADMISSION:  The patient is a 40 year old female from [place], [place] referred due to her escalating depressive symptoms.  She experienced a number of stressors within the past week, including losing her job and finding out that a school loan she was planning on would not materialize.  She developed numerous neurovegetative symptoms and began to sty in bed and not eat.  She was unable to function and her family felt fearful for her safety on an outpatient basis.  She is currently in therapy with Dr. Vilinda Flake.  She used to be on Serzone but did not take this regularly.  She also suffers from diabetes and hypertension.   For further admission information see the psychiatric admission assessment.  PHYSICAL FINDINGS:  Patient was obese.  There were no acute medical findings. She does have diabetes, hypertension and asthma.  ADMISSION LABORATORIES:  CBC with differential was within normal limits.  A routine chemistry panel was positive for an elevated fasting glucose of 240 (70-115).  TSH was within normal limits.  Urinalysis was positive for greater than 1000 glucose and 30 protein.  She will continue to be followed by her outpatient family physician, Dr. Criss Alvine, who is managing her medical problems.  HOSPITAL COURSE:  Upon admission, patient was started on hydrochlorothiazide 12.5 mg q.a.m., Altace 10 mg q.d., insulin humalog 30 units sub-q AC meals and Lantus insulin 60 units sub-q, q.h.s., birth control pills (Ortho-Tri-Cyclen) 1 p.o. q.d.  On February 08, 2000, patient was begun on Celexa 20 mg q.a.m. and Xanax .25 mg p.o. b.i.d.  On February 10, 2000, due to severe insomnia, she was begun on Trazodone 50 mg p.o. q.h.s.  On  February 11, 2000, she was placed on a p.r.n. dose of Xanax .25 mg q.4h. p.r.n. anxiety.  Patient tolerated these medications well, with no significant side effects.  She was able to talk in groups and in individual therapy.  Several days into the hospitalization, she became acutely agitated after finding out she had indeed lost her job at the [employer]; however, her father had visited and she was calmed by this.  A family session was held and a decision was made for her to return to live with her cousin.  There was also the possibility of her returning to [place] to live with her father if she needed to.  She began to make plans for going back to school after finding out her school loan did not materialize.  DISCHARGE MENTAL STATUS EXAMINATION:  Mental status as improved.  Patient was much calmer and less depressed and anxious.  She was no longer tearful.  Eye contact was improved.  There was a decrease in psychomotor retardation.  There was no suicidal or homicidal ideation.  There was no psychosis or perceptual disturbance.  Thoughts were logical and goal directed.  Cognitive exam was within normal limits and back to baseline, and judgment good, insight fair.  DISCHARGE DIAGNOSES: Axis I:    Major depression, recurrent, severe, without psychosis. Axis II:   Deferred. Axis III:  1. Diabetes.  2. Hypertension.            3. Asthma.            4. Obesity. Axis IV:   Severe. Axis V:    Global assessment of function 60 upon discharge, 30 upon            admission.  DISCHARGE MEDICATIONS: 1. Trazodone 50 mg p.o. qhs 2. Celexa 20 mg q.a.m. 3. Xanax 0.25 mg p.o. b.i.d. 4. Hydrochlorothiazide 5. Altace 6. Humalog insulin 7. Lantus insulin, last 4 as prescribed by her primary care physician. 8. Ortho Tri-Cyclen birth control pills 1 p.o. q.d.  ACTIVITY LEVEL:  No restrictions.  DIET:  Needs to remain on 1800 KCal ADA diet.  POST HOSPITAL CARE PLAN:  Patient will attend  the [facility] Intensive [facility]patient Program starting February 15, 2000.  She will be managed by Carolanne Grumbling, M.D. DD:  03/11/00 TD:  03/12/00 Job: [id number]
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
## 2010-06-12 NOTE — H&P (Signed)
[name]  BRUCE, MAYERS NO.:  [id number]   MEDICAL RECORD NO.:  [id number]          PATIENT TYPE:  IPS   LOCATION:  0405                          FACILITY:  [facility]   PHYSICIAN:  Anselm Jungling, MD  DATE OF BIRTH:  01/16/1971   DATE OF ADMISSION:  01/13/2006  DATE OF DISCHARGE:                       PSYCHIATRIC ADMISSION ASSESSMENT   IDENTIFYING INFORMATION:  This is a 40 year old single African-American  female voluntarily admitted on January 13, 2006.   HISTORY OF PRESENT ILLNESS:  The patient presents with depression and  passive suicidal thoughts.  She has been off her medications for the  past two weeks due to her depression, feeling very hopeless, not caring  about herself and stating that, if something happened being off her  meds, that would be fine.  The patient reports severe stressors, having  ongoing conflict with her boyfriend.  He is tired of her attitude, tired  of her acting out.  They have been living in a hotel as she has lost her  house recently.  Prior to that, the patient was living in a shelter.  She is having financial issues, missing work due to her depression, has  a lack of support and is now afraid that her boyfriend wants to leave  her.  She also has ongoing medical issues and has been noncompliant with  her medication.  She denies hallucinations and promises safety.   PAST PSYCHIATRIC HISTORY:  Second visit to [facility].  She was  here in June of 2007 for similar symptoms.  Sees Dr. Evelene Croon for outpatient  psychiatric services.  Last visit was approximately two months ago.   SOCIAL HISTORY:  This is a 40 year old single African-American female  with no children.  Living with her boyfriend, name Jasmine [name].  The patient  works at a [employer] for 12-1/2 years, Environmental health practitioner.  She is  currently on probation for worthless checks.   FAMILY HISTORY:  Mother, she thinks, has bipolar.   ALCOHOL/DRUG HISTORY:  Nonsmoker.  Denies any  alcohol or drug use.   PRIMARY CARE PHYSICIAN:  Dr. Maryelizabeth Rowan in [place].   MEDICAL PROBLEMS:  Type 2 diabetes, currently on insulin, hypertension,  and asthma.   MEDICATIONS:  Takes Lantus 80 units subcu at bedtime, Altace 10 mg  daily, Lamictal 100 mg daily, Wellbutrin 300 mg daily, again has been  noncompliant for the past two weeks.   ALLERGIES:  PAXIL, reporting a rash.   PHYSICAL EXAMINATION:  The patient was assessed at [facility] Emergency  Department.  She is an overweight female in no acute distress.  Temperature is 98.3, heart rate 101, respirations 18, blood pressure  143/88, 5 feet 6 inches tall, weight 246 pounds.   LABORATORY DATA:  Her CBC is within normal limits.  Glucose 331,  hemoglobin 12.1.  Acetaminophen level less than 10.  Salicylate level  less than 4.  Urine drug screen is negative.  Alcohol level less than 5.   MENTAL STATUS EXAM:  She is fully alert, cooperative, fair eye contact.  Currently dressed in scrubs.  Speech is clear, normal rate and  tone.  The patient feels frustrated.  Affect is sad.  Thought processes are  coherent and goal-directed.  Cognitive function intact.  Memory is good.  Judgment is fair.  Insight is fair.  Concentration intact and asking  appropriate questions.   DIAGNOSES:  AXIS I:  Bipolar disorder, depressed.  AXIS II:  Deferred.  AXIS III:  Type 2 diabetes, hypertension and asthma.  AXIS IV:  Severe (related to primary support, housing, economic issues,  psychosocial problems, medical problems).  AXIS V:  Current 35.   PLAN:  To stabilize mood and thinking.  Will contract for safety.  Will  monitor blood sugar.  Resume patient's Lantus.  Will add Neurontin for  mood instability, anger and anxiety and outbursts.  Will have a family  session with the boyfriend.  Cas[place]ager is to clarify living  arrangements.  The patient is to follow up Dr. Evelene Croon and to follow up  with her medical issues.   TENTATIVE LENGTH OF  STAY:  Five to six days.      Landry Corporal, N.P.      Anselm Jungling, MD  Electronically Signed    JO/MEDQ  D:  01/14/2006  T:  01/15/2006  Job:  [id number] [id number]

## 2010-06-12 NOTE — Discharge Summary (Signed)
NAMETINIE, MCGLOIN                ACCOUNT NO.:  [id number]   MEDICAL RECORD NO.:  [id number]          PATIENT TYPE:  INP   LOCATION:  1435                         FACILITY:  [facility]   PHYSICIAN:  Madaline Savage, MD        DATE OF BIRTH:  15-Sep-1970   DATE OF ADMISSION:  03/23/2006  DATE OF DISCHARGE:  03/24/2006                               DISCHARGE SUMMARY   DISCHARGE SUMMARY   PRIMARY CARE PHYSICIAN:  Dr. Duanne Guess   PRIMARY PSYCHIATRIST:  Dr. Milagros Evener   DISCHARGE DIAGNOSES:  1. Trazodone overdose, questionable attempted suicide.  2. Bipolar disorder.  3. Poorly controlled diabetes mellitus.  4. Hypertension.   DISCHARGE MEDICATIONS:  1. Wellbutrin 300 mg once daily.  2. Neurontin 100 mg twice daily.  3. Lamictal 150 mg daily.  4. Altace 10 mg daily.  5. Clonidine 0.1 mg twice daily.  6. Lantus 60 units daily.  7. NovoLog 2 units with breakfast and dinner and 5 units with lunch.   HISTORY OF PRESENT ILLNESS:  For a full History and Physical see the  History and Physical dictated by Dr. Renae Gloss on March 23, 2006.  In  short, Ms. Eustice is a 40 year old lady who has a history of diabetes  mellitus type 2 and bipolar disorder who comes in after she took  approximately 1500 mg of trazodone.  She states this was a partial  suicide attempt and also an attempt to gain attention of her boyfriend.  She was brought to the ER 30 minutes after the overdose.  She was given  charcoal therapy.  Initially she was lethargic and she was admitted for  observation.   PROBLEM LIST:  1. Trazodone overdose.  She had got charcoal and she has been totally      awake.  Her EKG is in normal sinus rhythm.  She is now medically      stable for discharge.  2. Suicidal ideation.  We consulted a Psychiatrist, Dr. Jeanie Sewer, who      saw the patient.  Dr. Jeanie Sewer has cleared her for discharge.  Dr.      Jeanie Sewer also wants her to be set up for outpatient psych IOP.  He      will have her go to  a local hospital for the psych IOP which will      start tomorrow.   DISPOSITION:  She will now be discharged home in a stable condition.  She will follow up with her regular family doctor in 2-3 weeks.      Madaline Savage, MD  Electronically Signed     PKN/MEDQ  D:  03/24/2006  T:  03/24/2006  Job:  [id number]

## 2010-06-12 NOTE — Discharge Summary (Signed)
[name], LAFORTUNE NO.:  [id number]   MEDICAL RECORD NO.:  [id number]          PATIENT TYPE:  IPS   LOCATION:  0500                          [facility]ILITY[facility]  [facility]   PHYSICIAN:  Anselm Jungling, MD  DATE OF BIRTH:  02/06/1970   DATE OF ADMISSION:  01/13/2006  DATE OF DISCHARGE:  01/21/2006                               DISCHARGE SUMMARY   IDENTIFYING DATA/REASON FOR ADMISSION:  This was an inpatient  psychiatric admission for Jasmine [name], a 40 year old single female who was  admitted on a voluntary basis due to depression and passive suicidal  thoughts.  She had been off her medications for the previous two weeks.  Please refer to the admission note pertaining to the circumstances,  history and other factors that contributed to her hospitalization.   INITIAL DIAGNOSTIC IMPRESSION:  She was given an initial AXIS I  diagnosis of bipolar disorder, currently depressed.   MEDICAL/LABORATORY:  The patient came to [place] with a history of type 2  diabetes, hypertension and asthma.  She was medically and physically  assessed by the psychiatric nurse practitioner.  She was continued on  her usual nonpsychotropic regimen consisting of Altace 10 mg daily,  Catapres 0.1 mg b.i.d., and Lantus insulin 80 units at bedtime.  Her  insulin needs were closely monitored by the nurse practitioner and the  pharmacy during her stay.   HOSPITAL COURSE:  The patient was admitted to the adult inpatient  psychiatric service.  This was her second [facility] admission, having been  here in June of 2007 for similar symptoms.  She had been seeing Dr. Evelene Croon  as an outpatient.   She presented as a fully alert, cooperative woman whose speech was clear  with normal rate and tone.  She expressed frustration and her affect was  sad.  Her thought processes were coherent and goal-directed, and  cognitive functioning and memory were good.  Judgment and insight were  felt to be fair.   The patient's medication needs  were reviewed, and she was continued on  Lamictal at doses of 25 mg in the morning and 100 mg in the evening,  Neurontin 100 mg t.i.d., and Wellbutrin XL 300 mg daily, with trazodone  75-100 mg as needed for insomnia at bedtime.   She was involved in various therapeutic groups and activities and was a  reasonably good participant in the treatment program.  Her care was  initially rendered by Dr. Milford Cage and, on the fifth hospital day,  the undersigned assumed care of the patient.  On that day, the patient  reported that she was increasingly anxious.  Her Wellbutrin had been  increased to 450 mg daily earlier in her stay.  She reported that she  was still depressed, but overall stated I guess I'm okay.  She had  been attending groups on the open psychiatric unit side with good  participation.  Because of this, it was felt appropriate to move her to  that portion of the inpatient unit.  Because it was felt the Wellbutrin  was probably increasing her level of anxiety, her  dose was reduced back  down to 300 mg daily.   She continued in the inpatient program, and remained a good participant.  She reported less anxiety after the reduction of Wellbutrin, but  remained depressed, with sad affect and overall fragile presentation.   On the seventh hospital day, the patient stated I feel better today.  She was up and active in the milieu, and participating in groups.  She  appeared less depressed.  There had been efforts to get her fiance in  for a family meeting, but apparently her fiance had been discounting the  impact of her depression on her well-being and functioning and he  expressed that he was not interested in coming in for such a meeting.   The patient continued to improve over the next two days and was  discharged on the ninth hospital day.   AFTERCARE:  The patient was to follow up with Dr. Evelene Croon on January 21, 2006, and with Braxton Feathers for individual counseling on February 01, 2006.  She was instructed to follow up with Dr. Katherina [name] in two weeks regarding  her medical conditions.   DISCHARGE MEDICATIONS:  1. Wellbutrin XL 300 mg daily.  2. Trazodone 75-100 mg at bedtime as needed for insomnia.  3. Lamictal 125 mg daily.  4. Altace 10 mg daily.  5. Neurontin 100 mg t.i.d.  6. Catapres 0.1 mg b.i.d.  7. Lantus insulin 80 units at bedtime.   DISCHARGE DIAGNOSES:  AXIS I:  Bipolar disorder, most recently depressed  without psychotic features.  AXIS II:  Deferred.  AXIS III:  History of hypertension, diabetes mellitus.  AXIS IV:  Stressors:  Severe.  AXIS V:  GAF on discharge 65.      Anselm Jungling, MD  Electronically Signed     SPB/MEDQ  D:  02/04/2006  T:  02/06/2006  Job:  [id number]

## 2010-06-12 NOTE — Consult Note (Signed)
[facility]                          ENDOCRINOLOGY CONSULTATION   Jasmine [name], Jasmine [name]                       MRN:          [id number]  DATE:03/07/2006                            DOB:          05/16/1970    REFERRING PHYSICIAN:  Maryelizabeth Rowan, M.D.   REASON FOR REFERRAL:  Diabetes.   HISTORY OF PRESENT ILLNESS:  A 40 year old woman with a 14-year history  of diabetes.  She was last seen here in 2003.  She now takes Lantus 75  units q.h.s. as well as p.r.n. NovoLog which averages a total of 10-40  units a day.  She describes her glucose as variable and her A1c was  recently 12.  She states she was off her insulin for a while, but she  is now back on it for the last 3 months.  Symptomatically, she has  several weeks of decreased vision with some associated pain in both feet  as well as 20 pounds weight gain over the past few weeks.   PAST MEDICAL HISTORY:  1. Carpal tunnel syndrome.  2. Diabetes retinopathy.  3. Depression.  4. Contraception.  5. Allergic rhinitis.   SOCIAL HISTORY:  She is single.  She works at the [employer].   FAMILY HISTORY:  Positive for diabetes in her mother.   REVIEW OF SYSTEMS:  She has some numbness on her feet and also reports  occasional mild hypoglycemia after breakfast with no loss of  consciousness.   PHYSICAL EXAMINATION:  VITAL SIGNS:  Blood pressure 142/79, heart rate  89, temperature is 96.7, the weight is 263.  GENERAL:  She is overweight.  She is in no distress.  SKIN:  Not diaphoretic.  No rash.  HEENT:  No proptosis.  No periorbital swelling.  Pharynx is normal.  NECK:  Supple.  No goiter.  CHEST:  Clear to auscultation.  No respiratory distress.  CARDIOVASCULAR:  No edema.  Regular rate and rhythm.  No murmur.  EXTREMITIES:  Pedal pulses are intact and there are no bruit at the  carotid arteries.  Feet, normal color and temperature.  There is no  ulcer present on the feet.  NEUROLOGIC:  Alert and  oriented.  Does not appear anxious nor depressed  and sensation is intact to touch in the feet.   IMPRESSION:  1. Insulin-requiring type 2 diabetes.  She has not recently returned      for followup, and non-adherence with followup probably is the main      reason for her poor control.  2. Hypertension.  3. Proliferative diabetes retinopathy.   PLAN:  1. Because of the complexity of this situation, we will need to take      it in stages.  2. We discussed the risk of diabetes and the importance of diet and      exercise therapy.  3. Decrease Lantus to 60 units a day.  4. Change NovoLog to 10 breakfast, 5 lunch, 10 supper.  5. Follow up with ophthalmology.  6. Please see Dr. Duanne Guess to follow up your hypertension.  7. Return in two weeks with your home  glucose record.     [name], MD  Electronically Signed    SAE/MedQ  DD: 04/21/2006  DT: 04/21/2006  Job #: [id number]   cc:   Maryelizabeth Rowan, M.D.

## 2010-06-12 NOTE — Discharge Summary (Signed)
Jasmine [name], Jasmine [name]                ACCOUNT NO.:  [id number]   MEDICAL RECORD NO.:  [id number]          PATIENT TYPE:  INP   LOCATION:  1428                         [facility]ACILITY:  [facility]   PHYSICIAN:  Melissa L. [name], MD  DATE OF BIRTH:  1970-04-07   DATE OF ADMISSION:  03/04/2005  DATE OF DISCHARGE:  03/06/2005                                 DISCHARGE SUMMARY   CHIEF COMPLAINT ON ADMISSION:  Severe abdominal pain, nausea, vomiting.   DISCHARGE DIAGNOSES:  1.  Urinary tract infection.  The patient was started on intravenous Cipro      and intravenous hydration with good results.  No further testing was      done to ascertain if pyelonephritis was present as her pain resolved      nearly simultaneous to treatment.  2.  Uncontrolled diabetes.  The patient was started back on Lantus at 60      units nightly and given sliding scale insulin.  She had been not taking      her medication for some time. She was able to maintain good blood sugar      control on that currently regimen on discharge to home.  The patient      states that she has found a way to purchase her medications and vows to      be compliant.  3.  Depression and bipolar disease.  The patient is seen by her      psychiatrist, and part of her noncompliance issues are related to her      bipolar illness.  She does express understanding of the consequences of      not taking her medication but consistently is noncompliant.  We continue      her here on Geodon and Celexa.  She was recently on Depakote, but that      was not continued, and it was changed over to Wellbutrin.  The patient's      Wellbutrin was continued at 300 mg daily, and she can follow up with her      primary psychiatrist to obtain prescriptions for further care.  4.  Proteinuria.  The patient will be started on an ACE inhibitor which can      be up titrated as an outpatient.  5.  Lack of primary care.  The patient assures me that she is going to      locate a  primary care physician of her choice as an outpatient and have      encouraged her to do so in the next week so she can have a followup      visit to this admission.   DISCHARGE MEDICATIONS:  1.  Altace 5 mg once daily.  2.  Lantus 60 units subcutaneously at bedtime.  3.  Depakote will not be given.  4.  Celexa 20 mg once daily, can be continued by her psychiatrist.  5.  Geodon is at 20 mg once daily, can be continued by her psychiatrist.  6.  Wellbutrin 300 mg p.o. daily, can be continued by her psychiatrist.  The  patient states she likely has medications at home for that.  7.  The patient will be given a prescription for sliding scale insulin and      Lantus.  Lantus will be, as stated, 60.  8.  Sliding scale will be for sugar less than 60, no insulin; 60 to 100, no      insulin; 101 to 150, 3 units NovoLog; 151 to 200, 4 units of NovoLog;      201 to 250, 7 units of NovoLog; 251 to 300, 9 units of NovoLog.   HISTORY OF PRESENT ILLNESS:  The patient is a 40 year old female with a  known history of diabetes, bipolar disease, depression, and hypertension who  presented to the emergency room.  Symptoms are nausea, vomiting, and back  pain.  She was found to be hyperglycemic with obvious UTI and questionable  pyelonephritis. The patient has been noncompliant with all medications for  several months.  The patient was admitted to the general medical ward,  treated with IV hydration and insulin with good effect.  She was treated  with IV Cipro and converted before discharge to oral Cipro. The patient did  quite well.  She defervesced.  Her white count decreased to 8.6 from 11.9.   On the day of discharge, the patient's vital signs were stable, temperature  97.3, blood pressure 142/96, heart rate 95, respirations 18, O2 saturation  96%.  Her blood sugars were 136, 167.   This is a well-developed, moderately obese black female in no acute  distress.  She is normocephalic and  atraumatic.  Pupils equal, round, and  reactive to light.  Extraocular muscles are intact.  Mucous membranes are  moist.  Neck is supple.  There is no JVD, no lymph nodes, and no carotid  bruits.  Chest clear to auscultation.  There are no rhonchi or wheezes.  Cardiovascular is regular rate and rhythm.  Positive S1 and S2.  No S3, S4,  murmurs, rubs, or gallops. Abdomen is soft, obese, nontender, nondistended,  with positive bowel sounds.  Extremities show no cyanosis, clubbing, or  edema.   Pertinent laboratory values during the course of hospital stay reveal a  hemoglobin of 12.5, hematocrit 36.7, white count 8.6.  Sodium 140, potassium  3.4, chloride 118, CO2 22, BUN 6, creatinine 0.6.  Amylase 19, lipase 20.  LFTs were within normal limits.  A urine culture was obtained growing E.  coli.  Sensitivities are not available at this time, but I will follow up on  these to assure there is no resistant organism.  Her urine pregnancy test  was negative.  Her hemoglobin A1c was 14.7, consistent with her  noncompliance.   At this time, the patient is deemed stable for discharge to home to follow  up with her primary care physician.  I will monitor her cultures to make  sure she is not resistant to Cipro and call her if necessary for a change of  antibiotic if indeed she is.    Addendum:  Cultures were sensitive to Cipro.  No call made for any changes.  M.L. [name], M.D. 03/15/05      Melissa L. [name], MD  Electronically Signed     MLT/MEDQ  D:  03/06/2005  T:  03/06/2005  Job:  [id number]

## 2010-06-12 NOTE — Discharge Summary (Signed)
Jasmine [name], Jasmine [name] NO.:  [id number]   MEDICAL RECORD NO.:  [id number]          PATIENT TYPE:  INP   LOCATION:  1513                         [facility]AC[facility]   PHYSICIAN:  Hettie Holstein, D.O.    DATE OF BIRTH:  [birth year]   DATE OF ADMISSION:  09/27/2004  DATE OF DISCHARGE:                                 DISCHARGE SUMMARY   PRIMARY CARE PHYSICIAN:  Unassigned.   DATE OF TRANSFER:  Awaiting behavioral health bed availability.   ADMISSION DIAGNOSIS:  Hyperglycemia.   PRINCIPAL DIAGNOSIS:  Suicidal ideation.   SECONDARY DIAGNOSES:  1.  Hyperglycemia.  2.  Bipolar disorder.  3.  Hypertension.  4.  Insulin-requiring diabetes.   MEDICATIONS ON TRANSFER:  1.  Lantus 70 units subcu q.h.s.  2.  Humalog.  She is to resume her previous home regimen of 20 units before      meals.  3.  Continue Altace 10 mg daily.  4.  Geodon 420 mg with dinner, as before.  5.  Celexa 40 mg daily, as before.  6.  Wellbutrin XL 450 mg daily, as before.   DISPOSITION:  Ms. Anderegg has undergone psychiatric evaluation by Dr.  Jeanie Sewer, and the recommendation is for psychiatric inpatient transfer.   LABS PERFORMED THIS ADMISSION:  Chemistry panel was within normal limits.  Normal liver function tests.  Albumin 2.5.  Urine drug screen was negative.   HISTORY OF PRESENT ILLNESS:  For full details, refer to H&P as dictated by  Dr. Renato Battles; however, briefly, this is a 40 year old African-American  female who was admitted to behavioral health for evaluation and management  of depression and suicidal ideations.  At that time, she was sent to the  [facility] ER for elevated blood sugar.  She was started on Glucomander in  the emergency department and subsequently was transitioned to the hospital  admission.   HOSPITAL COURSE:  Patient was admitted.  Her blood sugars were controlled  with reinitiation of her home dose of Lantus.  She was evaluated by the  diabetes coordinator  with recommendations to continue Lantus as before and  at meal coverage with 1 unit for every 20 gm of carbohydrates and 2 units  with recommendations for prescription for a Glucometer, Lantus, and NovoLog.  At this time, her regimen is to be as she was at home prior to arrival with  20-30 units t.i.d. before meals, so she is familiar with this regimen.  As  noted above, she underwent psychiatric evaluation,  and the findings were that she was felt to be at risk and appropriate for  inpatient psychiatric hospitalization after assessment on September 29, 2004  by Dr. Jeanie Sewer.   DISPOSITION:  Pending arrangements at this time.      Hettie Holstein, D.O.  Electronically Signed     ESS/MEDQ  D:  09/29/2004  T:  09/29/2004  Job:  [id number]
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
## 2010-10-15 LAB — URINALYSIS, ROUTINE W REFLEX MICROSCOPIC
Bilirubin Urine: NEGATIVE
Glucose, UA: >1000 — AB
Ketones, ur: NEGATIVE
Protein, ur: >300 — AB
Specific Gravity, Urine: 1.029
Urobilinogen, UA: 0.2

## 2010-10-15 LAB — BASIC METABOLIC PANEL
CO2: 25
Chloride: 99
Creatinine, Ser: 0.72

## 2010-10-15 LAB — URINE CULTURE: Colony Count: >=100000

## 2010-10-15 LAB — CBC
HCT: 37
Hemoglobin: 12.9
MCHC: 34.8
MCV: 81.5
RBC: 4.55

## 2010-10-15 LAB — POCT PREGNANCY, URINE
Operator id: 28977
Preg Test, Ur: NEGATIVE

## 2010-10-15 LAB — URINE MICROSCOPIC-ADD ON

## 2010-10-15 LAB — DIFFERENTIAL
Basophils Relative: 1
Eosinophils Absolute: 0.3
Eosinophils Relative: 3
Monocytes Absolute: 0.5
Monocytes Relative: 6
Neutrophils Relative %: 64

## 2010-10-17 ENCOUNTER — Emergency Department (HOSPITAL_COMMUNITY): Payer: 59

## 2010-10-17 ENCOUNTER — Inpatient Hospital Stay (HOSPITAL_COMMUNITY)
Admission: EM | Admit: 2010-10-17 | Discharge: 2010-10-20 | DRG: 639 | Disposition: A | Discharge status: Home / Self Care | Payer: 59 | Attending: Family Medicine | Admitting: Family Medicine

## 2010-10-17 DIAGNOSIS — I129 Hypertensive chronic kidney disease with stage 1 through stage 4 chronic kidney disease, or unspecified chronic kidney disease: Secondary | ICD-10-CM | POA: Diagnosis present

## 2010-10-17 DIAGNOSIS — N181 Chronic kidney disease, stage 1: Secondary | ICD-10-CM | POA: Diagnosis present

## 2010-10-17 DIAGNOSIS — R569 Unspecified convulsions: Secondary | ICD-10-CM

## 2010-10-17 DIAGNOSIS — E1149 Type 2 diabetes mellitus with other diabetic neurological complication: Secondary | ICD-10-CM | POA: Diagnosis present

## 2010-10-17 DIAGNOSIS — K3184 Gastroparesis: Secondary | ICD-10-CM | POA: Diagnosis present

## 2010-10-17 DIAGNOSIS — D509 Iron deficiency anemia, unspecified: Secondary | ICD-10-CM | POA: Diagnosis present

## 2010-10-17 DIAGNOSIS — I10 Essential (primary) hypertension: Secondary | ICD-10-CM

## 2010-10-17 DIAGNOSIS — Z7982 Long term (current) use of aspirin: Secondary | ICD-10-CM

## 2010-10-17 DIAGNOSIS — R51 Headache: Secondary | ICD-10-CM | POA: Diagnosis present

## 2010-10-17 DIAGNOSIS — E785 Hyperlipidemia, unspecified: Secondary | ICD-10-CM

## 2010-10-17 DIAGNOSIS — E1310 Other specified diabetes mellitus with ketoacidosis without coma: Principal | ICD-10-CM | POA: Diagnosis present

## 2010-10-17 DIAGNOSIS — I2510 Atherosclerotic heart disease of native coronary artery without angina pectoris: Secondary | ICD-10-CM | POA: Diagnosis present

## 2010-10-17 DIAGNOSIS — Z794 Long term (current) use of insulin: Secondary | ICD-10-CM

## 2010-10-17 DIAGNOSIS — E119 Type 2 diabetes mellitus without complications: Secondary | ICD-10-CM

## 2010-10-17 LAB — GLUCOSE, CAPILLARY
Glucose-Capillary: >600 mg/dL (ref 70–99)
Glucose-Capillary: 211 mg/dL — ABNORMAL HIGH (ref 70–99)
Glucose-Capillary: 209 mg/dL — ABNORMAL HIGH (ref 70–99)
Glucose-Capillary: 181 mg/dL — ABNORMAL HIGH (ref 70–99)
Glucose-Capillary: 135 mg/dL — ABNORMAL HIGH (ref 70–99)

## 2010-10-17 LAB — URINALYSIS, ROUTINE W REFLEX MICROSCOPIC
Bilirubin Urine: NEGATIVE
Ketones, ur: NEGATIVE mg/dL
Leukocytes, UA: NEGATIVE
Nitrite: NEGATIVE
Specific Gravity, Urine: 1.024 (ref 1.005–1.030)
Urobilinogen, UA: 0.2 mg/dL (ref 0.0–1.0)
pH: 6 (ref 5.0–8.0)

## 2010-10-17 LAB — CBC
HCT: 34.6 % — ABNORMAL LOW (ref 36.0–46.0)
Hemoglobin: 11.7 g/dL — ABNORMAL LOW (ref 12.0–15.0)
MCHC: 33.8 g/dL (ref 30.0–36.0)
RBC: 4.21 MIL/uL (ref 3.87–5.11)
WBC: 8.9 10*3/uL (ref 4.0–10.5)

## 2010-10-17 LAB — BASIC METABOLIC PANEL
BUN: 27 mg/dL — ABNORMAL HIGH (ref 6–23)
CO2: 20 mEq/L (ref 19–32)
Chloride: 96 mEq/L (ref 96–112)
Chloride: 106 mEq/L (ref 96–112)
GFR calc Af Amer: 56 mL/min — ABNORMAL LOW (ref >60)
GFR calc non Af Amer: 34 mL/min — ABNORMAL LOW (ref >60)
Glucose, Bld: 675 mg/dL (ref 70–99)
Potassium: 4.7 mEq/L (ref 3.5–5.1)
Potassium: 4.1 mEq/L (ref 3.5–5.1)
Sodium: 125 mEq/L — ABNORMAL LOW (ref 135–145)

## 2010-10-17 LAB — POCT I-STAT 3, VENOUS BLOOD GAS (G3P V)
Acid-base deficit: 9 mmol/L — ABNORMAL HIGH (ref 0.0–2.0)
O2 Saturation: 81 %
pO2, Ven: 51 mmHg — ABNORMAL HIGH (ref 30.0–45.0)

## 2010-10-17 LAB — PRO B NATRIURETIC PEPTIDE: Pro B Natriuretic peptide (BNP): 1864 pg/mL — ABNORMAL HIGH (ref 0–125)

## 2010-10-17 LAB — POCT I-STAT, CHEM 8
Chloride: 107 mEq/L (ref 96–112)
Glucose, Bld: >700 mg/dL (ref 70–99)
HCT: 35 % — ABNORMAL LOW (ref 36.0–46.0)
Hemoglobin: 11.9 g/dL — ABNORMAL LOW (ref 12.0–15.0)
Potassium: 4.7 mEq/L (ref 3.5–5.1)
Sodium: 129 mEq/L — ABNORMAL LOW (ref 135–145)

## 2010-10-17 LAB — DIFFERENTIAL
Basophils Absolute: 0 10*3/uL (ref 0.0–0.1)
Basophils Relative: 0 % (ref 0–1)
Lymphocytes Relative: 23 % (ref 12–46)
Neutro Abs: 6 10*3/uL (ref 1.7–7.7)
Neutrophils Relative %: 68 % (ref 43–77)

## 2010-10-17 LAB — URINE MICROSCOPIC-ADD ON

## 2010-10-18 ENCOUNTER — Inpatient Hospital Stay (HOSPITAL_COMMUNITY): Payer: 59

## 2010-10-18 LAB — BASIC METABOLIC PANEL
BUN: 22 mg/dL (ref 6–23)
CO2: 18 mEq/L — ABNORMAL LOW (ref 19–32)
Chloride: 107 mEq/L (ref 96–112)
Creatinine, Ser: 1.22 mg/dL — ABNORMAL HIGH (ref 0.50–1.10)
Glucose, Bld: 178 mg/dL — ABNORMAL HIGH (ref 70–99)

## 2010-10-18 LAB — CBC
HCT: 31.6 % — ABNORMAL LOW (ref 36.0–46.0)
Hemoglobin: 10.4 g/dL — ABNORMAL LOW (ref 12.0–15.0)
MCV: 81 fL (ref 78.0–100.0)
RBC: 3.9 MIL/uL (ref 3.87–5.11)
WBC: 8.3 10*3/uL (ref 4.0–10.5)

## 2010-10-18 LAB — GLUCOSE, CAPILLARY
Glucose-Capillary: 161 mg/dL — ABNORMAL HIGH (ref 70–99)
Glucose-Capillary: 356 mg/dL — ABNORMAL HIGH (ref 70–99)
Glucose-Capillary: 325 mg/dL — ABNORMAL HIGH (ref 70–99)

## 2010-10-18 LAB — HEMOGLOBIN A1C
Hgb A1c MFr Bld: 16.5 % — ABNORMAL HIGH (ref <5.7)
Mean Plasma Glucose: 427 mg/dL — ABNORMAL HIGH (ref <117)

## 2010-10-18 LAB — URINE CULTURE

## 2010-10-19 ENCOUNTER — Inpatient Hospital Stay (HOSPITAL_COMMUNITY): Payer: 59

## 2010-10-19 DIAGNOSIS — R569 Unspecified convulsions: Secondary | ICD-10-CM

## 2010-10-19 LAB — GLUCOSE, CAPILLARY
Glucose-Capillary: 216 mg/dL — ABNORMAL HIGH (ref 70–99)
Glucose-Capillary: 101 mg/dL — ABNORMAL HIGH (ref 70–99)
Glucose-Capillary: 106 mg/dL — ABNORMAL HIGH (ref 70–99)

## 2010-10-19 LAB — BASIC METABOLIC PANEL
BUN: 22 mg/dL (ref 6–23)
Chloride: 107 mEq/L (ref 96–112)
GFR calc Af Amer: 45 mL/min — ABNORMAL LOW (ref >60)
Potassium: 3.7 mEq/L (ref 3.5–5.1)

## 2010-10-19 LAB — CBC
HCT: 31.9 % — ABNORMAL LOW (ref 36.0–46.0)
RDW: 14.2 % (ref 11.5–15.5)
WBC: 6.1 10*3/uL (ref 4.0–10.5)

## 2010-10-19 NOTE — Procedures (Signed)
EEG NUMBER:  [id number].  This routine EEG was requested when this 40 year old woman was admitted for a seizure in the setting of hyperglycemia.  She was noted to have right hand convulsive movements.  The EEG was done with the patient predominantly drowsy but also awake. During periods of maximal wakefulness, she had a symmetric 11 cycle per second posterior dominant rhythm that attenuated with eye-opening, which was symmetric. Background activity was characterized by low amplitude alpha and beta activities that were symmetric.  Photic stimulation did not produce a driving response. Hyperventilation resulted in moderate buildup in the form of diffuse frontally dominant, slower activity that occurred in short bursts, typically 2-3 seconds in length.  The patient was drowsy during much of the recording.  There were frequent generalized bursts of slower activity both in the theta and delta range that were symmetric.    CLINICAL INTERPRETATION:  This routine EEG, done with the patient awake and drowsy, is normal.          ______________________________ Denton Meek, MD    [id number] D:  10/19/2010 22:08:14  T:  10/19/2010 22:35:50  Job #:  [id number]

## 2010-10-20 LAB — GLUCOSE, CAPILLARY
Glucose-Capillary: 107 mg/dL — ABNORMAL HIGH (ref 70–99)
Glucose-Capillary: 127 mg/dL — ABNORMAL HIGH (ref 70–99)
Glucose-Capillary: 153 mg/dL — ABNORMAL HIGH (ref 70–99)
Glucose-Capillary: 161 mg/dL — ABNORMAL HIGH (ref 70–99)
Glucose-Capillary: 73 mg/dL (ref 70–99)

## 2010-10-26 NOTE — Discharge Summary (Signed)
NAMEMarland Kitchen  ASSUNTA, PUPO NO.:  [id number]  MEDICAL RECORD NO.:  [id number]  LOCATION:  [facility]                         FACILITY:  [facility]  PHYSICIAN:  Paula Compton, MD        DATE OF BIRTH:  [birth year]  DATE OF ADMISSION:  10/17/2010 DATE OF DISCHARGE:  10/20/2010                              DISCHARGE SUMMARY   PRIMARY CARE PROVIDER:  [facility].  ADMISSION DIAGNOSES:  Seizure and hyperglycemia, hypertension, hyperlipidemia.  BRIEF HOSPITAL COURSE:  The patient is a 40 year old female with past medical history of type 2 diabetes who presented to the emergency department after a witnessed seizure at home. 1. Seizure.  The patient had seizure activity x1.  Her blood sugar was     found to be greater than 700 at that time.  She was brought in by     her boyfriend to the emergency department for workup.  Neurology     saw the patient and did an EEG to rule out a genetic cause of her     seizures.  They felt that the seizure was secondary to the     hyperviscosity of her blood due to her high blood sugar.  She had     no further seizure activity throughout her hospital admission.  It     was not felt that she needed to be started on an anti-epileptic     drug at this time, however, it is recommended due to [facility] the patient does not drive for 6 months since she did have a     seizure.  The patient doing well at discharge.  She will not need     to follow up with Neurology, but it is recommended that she keeps     her blood sugars under control to prevent further seizure activity. 2. Hyperglycemia.  The patient is an uncontrolled diabetic.  Her A1c     was checked during this admission and it was greater than 16.  The     patient is aware that her diabetes was uncontrolled.  She does not     regularly take her Lantus as prescribed due to financial cost of     the Lantus.  She states that she would be more likely to buy a less     expensive insulin  if possible. Since her blood sugar was greater     than 700 on admission, she was started on glucose stabilizer which     brought her blood sugars down into less than 200 range. Once the     insulin drip stopped, she was restarted on her Lantus and sliding     scale insulin as needed.  Since the financial issue is a problem     with the Lantus, she was transitioned over to 70/30 insulin. She was discharged home with     70/30 prescription which she will have filled today. 3. Hypertension.  The patient has uncontrolled hypertension at home.     She states she does take her home medications because they are 4     dollar list.  She came in  with blood pressure extremely elevated.     She has a reported past medical history of congestive heart     failure, but she does not have any exam findings to support that at     this time.  The patient was kept on her home medications and was     started on amlodipine while she was in the hospital which she did     well with.  At discharge she was sent home on amlodipine since it     seemed to control her blood pressure well. 4. Obesity.  The patient is morbidly obese.  Her mother died an early     death at age 55 and the patient understands that she needs to lose     weight.  We discussed diet with her and had the diabetic educator and     nutritionist visit her while she was in the hospital.  The patient     states that she would not typically eat a diabetic diet that she     has been eating while she has been here.  I would encourage her to     eat more healthy diet and lose weight which will improve her     chronic medical conditions overall.  FOLLOWUP RECOMMENDATIONS:  The patient was instructed to not drive for 6 months due to seizure.  She is to call [facility] to make an appointment for followup within the next 1-2 weeks.  She will start her insulin 70/30 which will hopefully better control her blood sugars.  Her hemoglobin A1c was greater than  16 and would recommend rechecking that in the next 6 months.    ______________________________ Rodman Pickle, MD   ______________________________ Paula Compton, MD    AH/MEDQ  D:  10/24/2010  T:  10/24/2010  Job:  [id number]  Electronically Signed by Rodman Pickle  on 10/26/2010 12:04:33 AM Electronically Signed by Paula Compton MD on 10/26/2010 11:05:15 AM

## 2010-10-27 NOTE — H&P (Signed)
NAMETENEA, SENS NO.:  [id number]  MEDICAL RECORD NO.:  [id number]  LOCATION:  [facility]                         FACILITY:  [facility]  PHYSICIAN:  Kaeley Vinje A. Sheffield Slider, M.D.    DATE OF BIRTH:  08-03-1970  DATE OF ADMISSION:  10/17/2010 DATE OF DISCHARGE:                             HISTORY & PHYSICAL   PRIMARY CARE PROVIDER:  Unassigned.  The patient received her [facility] at [facility].  CHIEF COMPLAINT:  Seizures and hyperglycemia.  HISTORY OF PRESENT ILLNESS:  This is a 40 year old Caucasian female with history of type 2 diabetes presenting with above.  The patient was brought to the ED after she presented with a seizure witnessed by her boyfriend.  The patient does not have any recollection of the details of the seizure except that it happened after she spoke with her father on the phone around 1300 today.  Her boyfriend was not present in the room during the interview, but she was told by her boyfriend that her right hand started shaking and then her whole body started shaking.  She does not know if she fell.  She has no history of previous seizures.  Regarding her diabetes, she receives follow up care at [facility].  Last seen there about 4 months ago.  Does not remember what her hemoglobin A1c was at that time.  She has been admitted in the past for high blood sugars.  Last time this occurred was about 3 years ago.  This happens because she sometimes cannot afford her insulin and this is what happened this time.  She last took her insulin on September 19.  She is compliant with her medications but did not take them today.  REVIEW OF SYSTEMS:  She denies headache, fever, chills, anxiety, depression, chest pain, palpitations, dyspnea, cough, abdominal pain, nausea, vomiting, constipation, diarrhea, dysuria, urinary frequency and urgency, vaginal discharge, changes in vision or hearing.  PAST MEDICAL HISTORY: 1. Type 2 diabetes, diagnosed at age 22. 2.  Hypertension. 3. Hyperlipidemia. 4. Anemia of iron deficiency. 5. Nonobstructive coronary artery disease in 2010. 6. Diabetic gastroparesis. 7. History of questionable bipolar disorder; she questions this     diagnosis she was given; she has not needed medications for several     years. 8. History of diastolic heart failure secondary to uncontrolled     hypertension.  PAST SURGICAL HISTORY:  Cholecystectomy and carpal tunnel repair.  SOCIAL HISTORY:  She lives with her boyfriend in [place].  Sexually active with boyfriend only.  Last period about 3 weeks ago.  Never smoked.  Occasional alcohol.  No illicit drugs.  FAMILY HISTORY:  Significant for mother with hypertension and diabetes and who died at age 35 from heart attack.  Father with Alzheimer disease.  No siblings.  ALLERGIES:  PAROXETINE, OXYCODONE, and IV CONTRAST cause nausea and vomiting.  MEDICATIONS: 1. Lasix 80 mg p.o. daily. 2. Lantus 80 units in the a.m. and 50 units in the p.m. 3. Humalog 10 units t.i.d. a.c. 4. Labetalol 200 mg p.o. b.i.d. 5. Aspirin 81 mg p.o. daily. 6. Ramipril 10 mg p.o. daily. 7. Iron 325 mg p.o. b.i.d.  PHYSICAL EXAMINATION:  VITAL SIGNS:  Temperature 97.8, blood pressure 191/102, then 170s/80s, heart rate 93, respiratory rate 16, and 100% on room air. GENERAL:  Not in apparent distress, obese. PSYCHIATRIC:  Pleasant, engaged fully appropriate to questions, not anxious or depressed appearing. HEENT:  Moist mucous membranes.  Extraocular movements intact.  Pupils equal, round, and reactive to light. NECK:  No lymphadenopathy. CARDIOVASCULAR:  Regular rate and rhythm with normal S1 and S2 and no murmurs, rubs, or gallops.  JVP at 7 cm. PULMONARY:  Clear to auscultation bilaterally with no wheezes, rales, or rhonchi. ABDOMEN:  Obese.  Normoactive bowel sounds.  Soft, nontender, and nondistended. EXTREMITIES:  Pitting edema 1+ bilaterally, warm, and dry, 3 to 4-second capillary  refill. NEUROLOGIC:  No focal deficits, 5/5 strength throughout, sensation intact, no cranial nerve deficits, gait stable, finger-to-nose and heel- to-shin intact bilaterally.  LABORATORY RESULTS: 1. BMET:  Sodium 125, potassium 4.7, chloride 96, bicarb 16, glucose     675, creatinine 1.65. 2. CBC:  White blood count 8.9, hemoglobin 11.7, and platelets 291. 3. CT of the head; negative for bleed or other acute intracranial     process.  However, possible small lacunar infarct in the anterior     limb left internal capsule which was not evident on the prior     study.  ASSESSMENT AND PLAN:  This is a 40 year old Caucasian female with a history of poorly controlled type 2 diabetes in the past and hospital in the past for hyperglycemia due to noncompliance with her insulin presenting with diabetic ketoacidosis and seizures. 1. Seizures.  Likely secondary to her diabetic ketoacidosis.  CT of     the head is concerning for possible small lacunar infarct.     Symptomatically, however, she is fully neurologically intact.  We     will check MRI of the brain to rule out infarct.  We will correct     diabetic ketoacidosis as described below.  We will check ECG for     any cardiac abnormalities and monitor on telemetry. 2. Type 2 diabetes with hyperglycemia and anion gap of 13.  We will     start Glucometer.  We will not give any more fluid boluses at this     time secondary to concern for fluid overload.  We will request     social work consult to help the patient get her insulin.  We will     check hemoglobin A1c. 3. Cardiac:  History of hypertension and congestive heart failure.     Concern for fluid overload with a history of congestive heart     failure, elevated blood pressure, and signs of fluid overload     manifesting as elevated JVP and lower extremity edema, although the     patient does not have any rales.  We will not give any more fluid     bolus at this time.  We will give her  home Lasix of 80 mg p.o.  We     will restart her home cardiac medications.  We will check repeat 2-     D echo since the last one was done in 2010 and showed an ejection     fraction of 60%. 4. Anemia of iron deficiency.  Hemoglobin okay today.  We will     continue home iron. 5. Fluid, electrolyte, nutrition/gastrointestinal.  IV fluids per     Glucometer protocol.  Carbohydrate-modified diet. 6. Prophylaxis.  Heparin subcu for deep vein thrombosis prophylaxis.  DISPOSITION:  Pending clinical improvement.  ______________________________ Priscella Mann, MD   ______________________________ Arnette Norris Sheffield Slider, M.D.    AO/MEDQ  D:  10/17/2010  T:  10/18/2010  Job:  [id number]  Electronically Signed by Priscella Mann MD on 10/22/2010 02:05:16 PM Electronically Signed by Zachery Dauer M.D. on 10/27/2010 05:18:39 AM

## 2010-10-30 LAB — DIFFERENTIAL
Eosinophils Relative: 2 % (ref 0–5)
Lymphocytes Relative: 32 % (ref 12–46)
Monocytes Absolute: 0.7 10*3/uL (ref 0.1–1.0)
Monocytes Relative: 7 % (ref 3–12)
Neutro Abs: 5.7 10*3/uL (ref 1.7–7.7)

## 2010-10-30 LAB — CBC
HCT: 30.6 % — ABNORMAL LOW (ref 36.0–46.0)
Platelets: 336 10*3/uL (ref 150–400)
RDW: 14 % (ref 11.5–15.5)
WBC: 9.7 10*3/uL (ref 4.0–10.5)

## 2010-10-30 LAB — COMPREHENSIVE METABOLIC PANEL
AST: 13 U/L (ref 0–37)
Albumin: 2.7 g/dL — ABNORMAL LOW (ref 3.5–5.2)
Alkaline Phosphatase: 84 U/L (ref 39–117)
BUN: 26 mg/dL — ABNORMAL HIGH (ref 6–23)
Creatinine, Ser: 0.87 mg/dL (ref 0.4–1.2)
GFR calc Af Amer: >60 mL/min (ref >60)
Potassium: 4.9 mEq/L (ref 3.5–5.1)
Total Protein: 6.5 g/dL (ref 6.0–8.3)

## 2010-10-30 LAB — AMYLASE: Amylase: 37 U/L (ref 27–131)

## 2010-10-30 LAB — GLUCOSE, CAPILLARY: Glucose-Capillary: 76 mg/dL (ref 70–99)

## 2010-11-02 ENCOUNTER — Inpatient Hospital Stay (HOSPITAL_COMMUNITY)
Admission: EM | Admit: 2010-11-02 | Discharge: 2010-11-06 | DRG: 293 | Disposition: A | Discharge status: Home / Self Care | Payer: 59 | Attending: Cardiology | Admitting: Cardiology

## 2010-11-02 ENCOUNTER — Emergency Department (HOSPITAL_COMMUNITY): Payer: 59

## 2010-11-02 DIAGNOSIS — K3184 Gastroparesis: Secondary | ICD-10-CM | POA: Diagnosis present

## 2010-11-02 DIAGNOSIS — E785 Hyperlipidemia, unspecified: Secondary | ICD-10-CM | POA: Diagnosis present

## 2010-11-02 DIAGNOSIS — Z794 Long term (current) use of insulin: Secondary | ICD-10-CM

## 2010-11-02 DIAGNOSIS — Z8249 Family history of ischemic heart disease and other diseases of the circulatory system: Secondary | ICD-10-CM

## 2010-11-02 DIAGNOSIS — E1149 Type 2 diabetes mellitus with other diabetic neurological complication: Secondary | ICD-10-CM | POA: Diagnosis present

## 2010-11-02 DIAGNOSIS — G40909 Epilepsy, unspecified, not intractable, without status epilepticus: Secondary | ICD-10-CM | POA: Diagnosis present

## 2010-11-02 DIAGNOSIS — E669 Obesity, unspecified: Secondary | ICD-10-CM | POA: Diagnosis present

## 2010-11-02 DIAGNOSIS — I2510 Atherosclerotic heart disease of native coronary artery without angina pectoris: Secondary | ICD-10-CM | POA: Diagnosis present

## 2010-11-02 DIAGNOSIS — E1169 Type 2 diabetes mellitus with other specified complication: Secondary | ICD-10-CM | POA: Diagnosis not present

## 2010-11-02 DIAGNOSIS — I5033 Acute on chronic diastolic (congestive) heart failure: Principal | ICD-10-CM | POA: Diagnosis present

## 2010-11-02 DIAGNOSIS — D638 Anemia in other chronic diseases classified elsewhere: Secondary | ICD-10-CM | POA: Diagnosis present

## 2010-11-02 DIAGNOSIS — Z91041 Radiographic dye allergy status: Secondary | ICD-10-CM

## 2010-11-02 DIAGNOSIS — N183 Chronic kidney disease, stage 3 unspecified: Secondary | ICD-10-CM | POA: Diagnosis present

## 2010-11-02 DIAGNOSIS — I509 Heart failure, unspecified: Secondary | ICD-10-CM

## 2010-11-02 DIAGNOSIS — I129 Hypertensive chronic kidney disease with stage 1 through stage 4 chronic kidney disease, or unspecified chronic kidney disease: Secondary | ICD-10-CM | POA: Diagnosis present

## 2010-11-02 DIAGNOSIS — Z7982 Long term (current) use of aspirin: Secondary | ICD-10-CM

## 2010-11-02 DIAGNOSIS — Z833 Family history of diabetes mellitus: Secondary | ICD-10-CM

## 2010-11-02 LAB — CBC
Platelets: 410 10*3/uL — ABNORMAL HIGH (ref 150–400)
RDW: 14.1 % (ref 11.5–15.5)
WBC: 10.5 10*3/uL (ref 4.0–10.5)

## 2010-11-02 LAB — DIFFERENTIAL
Basophils Absolute: 0 10*3/uL (ref 0.0–0.1)
Basophils Relative: 0 % (ref 0–1)
Eosinophils Absolute: 0.4 10*3/uL (ref 0.0–0.7)
Eosinophils Relative: 4 % (ref 0–5)

## 2010-11-02 LAB — PRO B NATRIURETIC PEPTIDE: Pro B Natriuretic peptide (BNP): 1569 pg/mL — ABNORMAL HIGH (ref 0–125)

## 2010-11-02 LAB — GLUCOSE, CAPILLARY: Glucose-Capillary: 66 mg/dL — ABNORMAL LOW (ref 70–99)

## 2010-11-02 LAB — BASIC METABOLIC PANEL
Calcium: 8.9 mg/dL (ref 8.4–10.5)
GFR calc non Af Amer: 37 mL/min — ABNORMAL LOW (ref >90)
Glucose, Bld: 91 mg/dL (ref 70–99)
Sodium: 137 mEq/L (ref 135–145)

## 2010-11-02 LAB — POCT PREGNANCY, URINE: Preg Test, Ur: NEGATIVE

## 2010-11-02 LAB — TROPONIN I: Troponin I: <0.3 ng/mL (ref <0.30)

## 2010-11-02 LAB — POCT I-STAT TROPONIN I: Troponin i, poc: 0.01 ng/mL (ref 0.00–0.08)

## 2010-11-02 MED ORDER — XENON XE 133 GAS
10.0000 | GAS_FOR_INHALATION | Freq: Once | RESPIRATORY_TRACT | Status: AC | PRN
  Administered 2010-11-02: 10 via RESPIRATORY_TRACT

## 2010-11-02 MED ORDER — TECHNETIUM TO 99M ALBUMIN AGGREGATED
6.0000 | Freq: Once | INTRAVENOUS | Status: AC | PRN
  Administered 2010-11-02: 6 via INTRAVENOUS

## 2010-11-03 DIAGNOSIS — I517 Cardiomegaly: Secondary | ICD-10-CM

## 2010-11-03 LAB — BASIC METABOLIC PANEL
CO2: 22 mEq/L (ref 19–32)
Chloride: 107 mEq/L (ref 96–112)
Creatinine, Ser: 1.86 mg/dL — ABNORMAL HIGH (ref 0.50–1.10)
GFR calc Af Amer: 38 mL/min — ABNORMAL LOW (ref >90)
Potassium: 4.6 mEq/L (ref 3.5–5.1)
Sodium: 138 mEq/L (ref 135–145)

## 2010-11-03 LAB — GLUCOSE, CAPILLARY
Glucose-Capillary: 104 mg/dL — ABNORMAL HIGH (ref 70–99)
Glucose-Capillary: 66 mg/dL — ABNORMAL LOW (ref 70–99)
Glucose-Capillary: 103 mg/dL — ABNORMAL HIGH (ref 70–99)
Glucose-Capillary: 118 mg/dL — ABNORMAL HIGH (ref 70–99)

## 2010-11-03 LAB — MRSA PCR SCREENING: MRSA by PCR: NEGATIVE

## 2010-11-03 LAB — TSH: TSH: 1.315 u[IU]/mL (ref 0.350–4.500)

## 2010-11-03 LAB — CBC
MCV: 85.1 fL (ref 78.0–100.0)
Platelets: 377 10*3/uL (ref 150–400)
RBC: 3.02 MIL/uL — ABNORMAL LOW (ref 3.87–5.11)
RDW: 14.3 % (ref 11.5–15.5)
WBC: 8.4 10*3/uL (ref 4.0–10.5)

## 2010-11-03 LAB — CARDIAC PANEL(CRET KIN+CKTOT+MB+TROPI)
CK, MB: 2.7 ng/mL (ref 0.3–4.0)
CK, MB: 3.2 ng/mL (ref 0.3–4.0)
Total CK: 206 U/L — ABNORMAL HIGH (ref 7–177)
Troponin I: <0.3 ng/mL (ref <0.30)

## 2010-11-03 LAB — LIPID PANEL
Cholesterol: 314 mg/dL — ABNORMAL HIGH (ref 0–200)
Total CHOL/HDL Ratio: 9 RATIO
Triglycerides: 159 mg/dL — ABNORMAL HIGH (ref <150)
VLDL: 32 mg/dL (ref 0–40)

## 2010-11-03 LAB — HEMOGLOBIN A1C
Hgb A1c MFr Bld: 13.2 % — ABNORMAL HIGH (ref <5.7)
Mean Plasma Glucose: 332 mg/dL — ABNORMAL HIGH (ref <117)

## 2010-11-04 ENCOUNTER — Inpatient Hospital Stay (HOSPITAL_COMMUNITY): Payer: 59

## 2010-11-04 DIAGNOSIS — I5030 Unspecified diastolic (congestive) heart failure: Secondary | ICD-10-CM

## 2010-11-04 LAB — CBC
HCT: 37
HCT: 33.3 — ABNORMAL LOW
Hemoglobin: 12.9
MCHC: 34.7
MCV: 82.6
MCV: 83.6
Platelets: 315
Platelets: 345
RBC: 3.77 — ABNORMAL LOW
RDW: 13.3
RDW: 13.4
WBC: 7.7

## 2010-11-04 LAB — COMPREHENSIVE METABOLIC PANEL
ALT: 9
AST: 11
Albumin: 2.4 — ABNORMAL LOW
Alkaline Phosphatase: 91
Alkaline Phosphatase: 68
BUN: 15
CO2: 22
Calcium: 9.1
Chloride: 110
Creatinine, Ser: 0.99
GFR calc Af Amer: >60
GFR calc non Af Amer: >60
Glucose, Bld: 134 — ABNORMAL HIGH
Potassium: 4.6
Potassium: 4.1
Sodium: 137
Total Bilirubin: 0.5
Total Protein: 7.7

## 2010-11-04 LAB — BASIC METABOLIC PANEL
BUN: 29 mg/dL — ABNORMAL HIGH (ref 6–23)
BUN: 15
CO2: 23 mEq/L (ref 19–32)
Calcium: 8.9
Creatinine, Ser: 1.03
GFR calc non Af Amer: >60
Glucose, Bld: 204 mg/dL — ABNORMAL HIGH (ref 70–99)
Glucose, Bld: 88
Potassium: 4.3 mEq/L (ref 3.5–5.1)
Potassium: 4.3
Sodium: 138 mEq/L (ref 135–145)

## 2010-11-04 LAB — LIPASE, BLOOD: Lipase: 17

## 2010-11-04 LAB — APTT: aPTT: 30

## 2010-11-04 LAB — DIFFERENTIAL
Basophils Absolute: 0
Basophils Relative: 0
Monocytes Relative: 5
Neutro Abs: 8.2 — ABNORMAL HIGH
Neutrophils Relative %: 73

## 2010-11-04 LAB — CULTURE, BLOOD (ROUTINE X 2)
Culture: NO GROWTH
Culture: NO GROWTH

## 2010-11-04 LAB — H. PYLORI ANTIBODY, IGG
H Pylori IgG: 0.5
H Pylori IgG: 0.5

## 2010-11-04 LAB — URINE MICROSCOPIC-ADD ON

## 2010-11-04 LAB — HEMOGLOBIN A1C
Hgb A1c MFr Bld: 11.5 — ABNORMAL HIGH
Mean Plasma Glucose: 332

## 2010-11-04 LAB — URINALYSIS, ROUTINE W REFLEX MICROSCOPIC
Nitrite: NEGATIVE
Specific Gravity, Urine: 1.022
Urobilinogen, UA: 0.2

## 2010-11-04 LAB — HEPATIC FUNCTION PANEL
Bilirubin, Direct: <0.1
Total Bilirubin: 0.7

## 2010-11-04 LAB — GLUCOSE, CAPILLARY

## 2010-11-05 ENCOUNTER — Inpatient Hospital Stay (HOSPITAL_COMMUNITY): Payer: 59

## 2010-11-05 DIAGNOSIS — J810 Acute pulmonary edema: Secondary | ICD-10-CM

## 2010-11-05 DIAGNOSIS — R05 Cough: Secondary | ICD-10-CM

## 2010-11-05 DIAGNOSIS — I509 Heart failure, unspecified: Secondary | ICD-10-CM

## 2010-11-05 DIAGNOSIS — R0602 Shortness of breath: Secondary | ICD-10-CM

## 2010-11-05 LAB — URINALYSIS, ROUTINE W REFLEX MICROSCOPIC
Bilirubin Urine: NEGATIVE
Glucose, UA: >1000 — AB
Glucose, UA: NEGATIVE
Ketones, ur: NEGATIVE
Leukocytes, UA: NEGATIVE
Protein, ur: >300 — AB
Protein, ur: >300 — AB
Specific Gravity, Urine: 1.02
Urobilinogen, UA: 0.2
Urobilinogen, UA: 0.2
Urobilinogen, UA: 0.2
pH: 6.5

## 2010-11-05 LAB — BASIC METABOLIC PANEL
BUN: 34 mg/dL — ABNORMAL HIGH (ref 6–23)
CO2: 23 mEq/L (ref 19–32)
Calcium: 8.7 mg/dL (ref 8.4–10.5)
Chloride: 103 mEq/L (ref 96–112)
Creatinine, Ser: 1.92 mg/dL — ABNORMAL HIGH (ref 0.50–1.10)
GFR calc Af Amer: 37 mL/min — ABNORMAL LOW (ref >90)
GFR calc non Af Amer: 32 mL/min — ABNORMAL LOW (ref >90)
Glucose, Bld: 257 mg/dL — ABNORMAL HIGH (ref 70–99)
Potassium: 4.9 mEq/L (ref 3.5–5.1)
Sodium: 138 mEq/L (ref 135–145)

## 2010-11-05 LAB — URINE CULTURE

## 2010-11-05 LAB — CBC
HCT: 33.7 — ABNORMAL LOW
Hemoglobin: 11.7 — ABNORMAL LOW
MCV: 82
RDW: 13.5
WBC: 8

## 2010-11-05 LAB — COMPREHENSIVE METABOLIC PANEL
Alkaline Phosphatase: 93
BUN: 11
Chloride: 105
Creatinine, Ser: 0.78
Glucose, Bld: 318 — ABNORMAL HIGH
Potassium: 4.7
Total Bilirubin: 0.5

## 2010-11-05 LAB — DIFFERENTIAL
Basophils Absolute: 0
Basophils Relative: 0
Neutro Abs: 5
Neutrophils Relative %: 63

## 2010-11-05 LAB — PRO B NATRIURETIC PEPTIDE: Pro B Natriuretic peptide (BNP): 1236 pg/mL — ABNORMAL HIGH (ref 0–125)

## 2010-11-05 LAB — GLUCOSE, CAPILLARY
Glucose-Capillary: 246 mg/dL — ABNORMAL HIGH (ref 70–99)
Glucose-Capillary: 230 mg/dL — ABNORMAL HIGH (ref 70–99)

## 2010-11-05 LAB — URINE MICROSCOPIC-ADD ON

## 2010-11-05 LAB — LIPASE, BLOOD: Lipase: 18

## 2010-11-06 ENCOUNTER — Inpatient Hospital Stay (HOSPITAL_COMMUNITY): Payer: 59

## 2010-11-06 DIAGNOSIS — I5033 Acute on chronic diastolic (congestive) heart failure: Secondary | ICD-10-CM

## 2010-11-06 LAB — BASIC METABOLIC PANEL
CO2: 23 mEq/L (ref 19–32)
Calcium: 8.8 mg/dL (ref 8.4–10.5)
Chloride: 105 mEq/L (ref 96–112)
Creatinine, Ser: 1.67 mg/dL — ABNORMAL HIGH (ref 0.50–1.10)
Glucose, Bld: 172 mg/dL — ABNORMAL HIGH (ref 70–99)

## 2010-11-06 LAB — GLUCOSE, CAPILLARY
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
## 2010-11-09 ENCOUNTER — Encounter: Payer: Self-pay | Admitting: *Deleted

## 2010-11-09 ENCOUNTER — Inpatient Hospital Stay (HOSPITAL_COMMUNITY): Admit: 2010-11-09 | Payer: 59

## 2010-11-17 NOTE — Discharge Summary (Signed)
Jasmine [name], Jasmine [name] NO.:  [id number]  MEDICAL RECORD NO.:  [id number]  LOCATION:  4743                         FACILITY:  [facility]  PHYSICIAN:  Verne Carrow, MDDATE OF BIRTH:  1970-05-20  DATE OF ADMISSION:  11/02/2010 DATE OF DISCHARGE:  11/06/2010                              DISCHARGE SUMMARY   PRIMARY CARDIOLOGIST:  The patient will be followed in the [facility].  PRIMARY CARE PROVIDER:  Dr. De Nurse.  DISCHARGE DIAGNOSIS:  Acute on chronic diastolic congestive heart failure.  SECONDARY DIAGNOSES: 1. Poorly controlled diabetes mellitus. 2. Hypertension. 3. Stage II-III chronic kidney disease. 4. History of diabetic ketoacidosis with seizures. 5. Diabetic gastroparesis. 6. Obesity. 7. Status post cholecystectomy. 8. History of mild nonobstructive coronary artery disease by     catheterization in 2010. 9. Anemia of chronic disease.  ALLERGIES:  IV CONTRAST.  PROCEDURES: 1. A 2D echocardiogram performed on November 03, 2010, EF 55-60%, normal     wall motion, without regional wall motion abnormalities.  Mild LVH.     Trivial mitral regurgitation with mildly dilated left atrium. 2. V/Q scan on November 02, 2010, showing normal ventilation perfusion     without evidence of PE. 3. CT of the chest without contrast, showing suspected multifocal     alveolar edema in the bilateral upper lobes and superior left lower     lobe.  Multifocal infection considered less likely.  Cardiomegaly     with bilateral pleural effusions.  No findings to suggest     interstitial lung disease.  HISTORY OF PRESENT ILLNESS:  A 40 year old female with prior history of diastolic heart failure and normal LV function, who was in her usual state of health approximately 2-3 days prior to admission when she began to experience dyspnea and orthopnea as well as slight chest tightness that was worse with deep breathing.  She reports compliance with  her medications, but presented to the ED on November 02, 2010, as her symptoms worsened.  Chest x-ray showed increased interstitial markings consistent with CHF.  She has no acute ST or T changes on her EKG.  It was felt that volume overload likely could account for symptoms, she is admitted for evaluation.  HOSPITAL COURSE:  The patient's cardiac enzymes remained negative.  She was placed on IV Lasix initially at 80 mg b.i.d.  She did have good diuresis with reduction in weight from 129.7 kilos on admission to 124.2 kilos today.  She did have a slight bump in her creatinine up to 1.92 on November 05, 2010, but this stabilized today as the Lasix dose was reduced.  She is still felt to have some volume overload and her current weight is still 6 kilos above her dry weight.  That said, the patient is symptomatically improved and wishes to be discharged today.  As a result, the patient will be discharged from [facility] today.  We have arranged her to follow up on Monday, November 09, 2010, in the [facility].  Of note, the patient was seen by Pulmonology during this admission as there was some question on chest x-ray as to whether or not patchy nodular  airspace disease in the left upper lobe could represent pneumonia.  Pulmonology recommended high-resolution CT, which was performed on November 05, 2010, and was more suggestive of alveolar edema and bilateral upper lobes and superior left lower lobes.  Further, the patient had symptomatic improvement with additional diuresis and Pulmonology did not feel the patient required additional pulmonary workup.  DISCHARGE LABS:  Hemoglobin 8.2, hematocrit 25.7, WBC 8.4, platelets 377.  Sodium 137, potassium 4.7, chloride 105, CO2 23, BUN 30, creatinine 1.67, glucose 172, calcium 8.8.  Hemoglobin A1c 13.2.  CK 158, MB 2.7, troponin I less than 0.30.  BNP 1236, down from 1569. Total cholesterol 314, triglycerides 159, HDL 35, LDL  247.  TSH 1.315. Urine pregnancy was negative.  MRSA screen was negative.  DISPOSITION:  The patient will be discharged home today in good condition.  FOLLOWUP PLANS AND APPOINTMENTS:  As above, the patient will follow up at the [facility] on Monday, November 09, 2010. She is to follow up with primary care provider as previously scheduled.  DISCHARGE MEDICATIONS: 1. Labetalol 300 mg b.i.d. 2. Aspirin 81 mg daily. 3. Lasix 80 mg b.i.d. 4. Iron sulfate 325 mg b.i.d. 5. Ramipril 10 mg at bedtime. 6. 70/30 insulin 20 units b.i.d. 7. Amlodipine 10 mg daily.  OUTSTANDING LAB STUDIES:  None.  DURATION OF DISCHARGE ENCOUNTER:  60 minutes including physician time.     Nicolasa Ducking, ANP   ______________________________ Verne Carrow, MD   CB/MEDQ  D:  11/06/2010  T:  11/06/2010  Job:  [id number]  cc:   Dr. Johann Capers R. [name], MD  Electronically Signed by Nicolasa Ducking ANP on 11/17/2010 04:17:34 PM Electronically Signed by Verne Carrow MD on 11/17/2010 04:26:18 PM

## 2010-11-18 ENCOUNTER — Encounter (HOSPITAL_COMMUNITY): Payer: Self-pay

## 2010-11-18 ENCOUNTER — Ambulatory Visit (HOSPITAL_COMMUNITY)
Admission: RE | Admit: 2010-11-18 | Discharge: 2010-11-18 | Disposition: A | Discharge status: Home / Self Care | Payer: 59 | Source: Ambulatory Visit | Attending: Internal Medicine | Admitting: Internal Medicine

## 2010-11-18 VITALS — BP 124/60 | HR 87 | Wt 269.2 lb

## 2010-11-18 DIAGNOSIS — I509 Heart failure, unspecified: Secondary | ICD-10-CM

## 2010-11-18 DIAGNOSIS — I5032 Chronic diastolic (congestive) heart failure: Secondary | ICD-10-CM

## 2010-11-18 NOTE — H&P (Signed)
[name]  Jasmine [name], PICHA NO.:  [id number]  MEDICAL RECORD NO.:  [id number]  LOCATION:  4743                         FACILITY:  [facility]  PHYSICIAN:  Marca Ancona, MD      DATE OF BIRTH:  March 25, 1970  DATE OF ADMISSION:  11/02/2010 DATE OF DISCHARGE:                             HISTORY & PHYSICAL   PRIMARY CARE PHYSICIAN:  Dr. Kathrynn Running.  HISTORY OF PRESENT ILLNESS:  This is a 40 year old with history of poorly-controlled diabetes, hypertension, chronic kidney disease, diastolic congestive heart failure who presents with evidence of volume overload and CHF.  The patient has had some shortness of breath for about 2-3 days.  She has been unable to lie flat (orthopnea again).  She was short of breath walking around her house starting yesterday. She reports slight chest tightness with deep breathing.  She has not missed her Lasix which she takes 80 mg p.o. b.i.d.  Her diet of note is poor. She eats fairly high salt diet, however she has been doing this for a long time.  Her blood sugar is running high in the 200s to 500s when she checks it.  She saw her primary care physician today.  Because of the shortness of breath, she had chest x-ray done that was concerning for pulmonary edema.  ALLERGIES:  The patient is allergic to IV CONTRAST.  MEDICATIONS: 1. Aspirin 81 mg daily. 2. Lasix 80 mg b.i.d. 3. Ramipril 10 mg daily. 4. Amlodipine 10 mg daily. 5. Insulin 70/30, 30 units b.i.d. 6. Labetalol 200 mg b.i.d. 7. Ferrous gluconate 325 mg b.i.d.  PAST MEDICAL HISTORY: 1. Seizure in the setting of severe hypoglycemia. 2. Hyperlipidemia. 3. Diabetes, this has been uncontrolled and going on and was diagnosed     at age of 15. 4. Hypertension. 5. Diabetic gastroparesis. 6. Obesity. 7. History of cholecystectomy. 8. Left heart catheterization in 2010, showed mild nonobstructive CAD. 9. Diastolic congestive heart failure.  Echo in September 2012 showed     EF 55%.  No  regional wall motion abnormalities, grade 1 diastolic     dysfunction. 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 11.Anemia of chronic kidney disease.  SOCIAL HISTORY:  The patient lives in [place] with a boyfriend.  She does not work.  She does not smoke.  She occasionally drinks alcohol.  FAMILY HISTORY:  Mother died at age 3 of MI.  Father had dementia.  REVIEW OF SYSTEMS:  All systems were reviewed and were negative except as noted in history of present illness.  PHYSICAL EXAMINATION:  VITAL SIGNS:  Temperature is 97.8, pulse is 80 and regular, blood pressure 167/69, oxygen saturation 96% on room air. GENERAL:  This is an obese female in no apparent distress. HEENT:  Normal. ABDOMEN:  Soft, nontender.  No hepatosplenomegaly.  Normal bowel sounds. NECK:  There is no carotid bruit.  JVP is elevated at 10-12 cm of water. CARDIOVASCULAR:  Heart regular, S1, S2.  No S3, no S4.  There is a 2/6 holosystolic murmur along the left lower sternal border.  There are 2+ posterior tibial pulses bilaterally.  There is 1+ edema about 1/2 way up the lower legs bilaterally.  EXTREMITIES:  No clubbing or cyanosis. LUNGS:  Clear to auscultation bilaterally with occasional rhonchi. SKIN:  No rash. NEUROLOGIC:  Alert and oriented x3. MUSCULOSKELETAL:  Normal exam.  Chest x-ray shows increased interstitial markings consistent with CHF. EKG shows normal sinus rhythm.  There is shallow lateral T-wave inversions.  There is poor anterior R-wave progression.  LABS:  White count 10.5, hematocrit 29.0, platelets 410, potassium 4.2, creatinine 1.68, and glucose 31, proBNP is 1569.  First set of troponin is negative.  IMPRESSION:  This is 40 year old with history of poorly-controlled diabetes, hypertension, chronic kidney disease, and diastolic congestive heart failure who presents with shortness of breath concerning for congestive heart failure exacerbation.  1. Congestive  heart failure.  I suspect patient has acute on chronic     diastolic congestive heart failure.  Recent echo showed preserved     ejection fraction.  This patient is volume overloaded on exam with     increased jugular venous pressure and peripheral edema.  I am not     much the trigger of this exacerbation was she has been taking her     Lasix as ordered.  We will plan on cycling her cardiac enzymes to     rule out myocardial infarction, continue her on her aspirin.  We     will start her on Lasix 40 mg IV b.i.d.  I think we probably should     rule out pulmonary embolus given her age, her pleuritic symptoms,     and a prominent right-sided congestive heart failure signs.  I will     get a V/Q scan. 2. Diabetes is poorly controlled.  We will get diabetes education     consult and increase her insulin. 3. Hypertension.  We will continue her medications and I will increase     the labetalol to 300 mg b.i.d.     Marca Ancona, MD     DM/MEDQ  D:  11/02/2010  T:  11/03/2010  Job:  [id number]  Electronically Signed by Marca Ancona MD on 11/18/2010 08:16:59 PM

## 2010-11-18 NOTE — Patient Instructions (Signed)
Continue current medications.  Will refer to pulmonary for sleep study.  Your physician has recommended that you have a cardiopulmonary stress test (CPX). CPX testing is a non-invasive measurement of heart and lung function. It replaces a traditional treadmill stress test. This type of test provides a tremendous amount of information that relates not only to your present condition but also for future outcomes. This test combines measurements of you ventilation, respiratory gas exchange in the lungs, electrocardiogram (EKG), blood pressure and physical response before, during, and following an exercise protocol.  Weigh yourself daily after obtaining scales.  Continue low sodium diet.  Follow up with Dr. Gala Romney in 2 weeks.

## 2010-11-18 NOTE — Progress Notes (Signed)
HPI: Dr. Luz Brazen - PCP  Jasmine [name] is a 40 y.o [place] American female with history of nonobstructive CAD per cath 2010, HTN, uncontrolled DM and diastolic dysfunction.  She has been evaluated by Dr. Trevor Iha approximately 2 years ago for chronic renal insufficiency.  Per pt she underwent a renal u/s that was ok and no further w/u was completed because it was felt to be 2/2 hypertension and diabetes.    She was recently admitted to [facility] for acute on chronic HF.  An echo on November 03, 2010, EF 55-60%, normal wall motion, without regional wall motion abnormalities.  Mild LVH. Probably pseudonormal filling pressures.  Trivial mitral regurgitation with mildly dilated left atrium.   Echo on October 19, 2010 showed grade 1 diastolic dysfunction.  V/Q scan on November 02, 2010, showing normal ventilation perfusion without evidence of PE. CT of the chest without contrast, showing suspected multifocal alveolar edema in the bilateral upper lobes and superior left lower lobe.  Multifocal infection considered less likely.  Cardiomegaly with bilateral pleural effusions.  No findings to suggest interstitial lung disease.  She was scheduled to follow up in the HF clinic and presents today for new pt evaluation.  She feels ok.  She is down 23 pounds since admission 10/8.  She continues to c/o dyspnea with exertion.  No dyspnea at rest.  She has 18 steps to her condo and must stop and rest after 12.  At work she processes mail and has difficulty with dyspnea most nights.  She has chronic 2 pillow orthopnea.  No PND but does snore at night.  Positive LEE that is worse after standing all day.  She became tearful during discussion because her mother was diagnosed with HF at 41 and died at the age of 61 and she does not want to be the same.  She is working on a low sodium diet.  She was paid today and will go to pick up a scale and begin weighing daily.  She was recently evaluated by Dr. Luz Brazen as a new patient and her DM  medications are being adjusted.      ROS: All systems negative except as listed in HPI, PMH and Problem List.  Past Medical History  Diagnosis Date  . Diastolic heart failure     echo 11/03/10 EF 55-60%  . Hypertension   . Chronic kidney disease (CKD)     stage II-III  . Hyperlipidemia   . Uncontrolled diabetes mellitus   . Diabetic ketoacidosis     with seizures  . Anemia   . Diabetic gastroparesis   . Obesity   . Coronary artery disease     mild per cath 2010    Current Outpatient Prescriptions  Medication Sig Dispense Refill  . amLODipine (NORVASC) 10 MG tablet Take 10 mg by mouth daily.        Marland Kitchen aspirin 81 MG tablet Take 81 mg by mouth daily.        Marland Kitchen atorvastatin (LIPITOR) 40 MG tablet Take 40 mg by mouth Daily.      . ferrous sulfate 325 (65 FE) MG tablet Take 325 mg by mouth 2 (two) times daily.        Marland Kitchen FREESTYLE LITE test strip Twice daily before meals.      . furosemide (LASIX) 80 MG tablet Take 80 mg by mouth 2 (two) times daily.        . hydrochlorothiazide (HYDRODIURIL) 12.5 MG tablet Take 12.5 mg by mouth Daily.      Marland Kitchen  insulin NPH-insulin regular (NOVOLIN 70/30) (70-30) 100 UNIT/ML injection Inject 20 Units into the skin 2 (two) times daily with a meal.        . labetalol (NORMODYNE) 300 MG tablet Take 300 mg by mouth 2 (two) times daily.        . Lancets (FREESTYLE) lancets       . NOVOFINE 30G X 8 MM MISC       . NOVOLOG MIX 70/30 FLEXPEN (70-30) 100 UNIT/ML injection Inject 15 Units into the skin Twice daily before meals.      . ramipril (ALTACE) 10 MG tablet Take 10 mg by mouth at bedtime.          FAMILY HISTORY:  Her mother died at age 83 with CHF and had hypertension and diabetes.   Father is living and has asthma, COPD, and onset of Alzheimer's  dementia.      SOCIAL HISTORY:  She lives in [place] with her boyfriends.  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  No tobacco or illicit drug use.  She drinks wine once every 3-4 months.    PHYSICAL EXAM: Filed Vitals:   11/18/10 1030  BP: 124/60  Pulse: 87  Wt 269 (274 at discharge on 10/12)  General:  Well appearing. No resp difficulty HEENT: normal Neck: supple. JVP 6-7.  Carotids 2+ bilaterally; no bruits. No lymphadenopathy or thryomegaly appreciated. Cor: PMI normal. Regular rate & rhythm. No rubs, gallops or murmurs. Lungs: clear Abdomen: soft, nontender, nondistended. No hepatosplenomegaly. No bruits or masses. Good bowel sounds. Extremities: no cyanosis, clubbing, rash, 1+ bilateral edema Neuro: alert & orientedx3, cranial nerves grossly intact. Moves all 4 extremities w/o difficulty. Affect pleasant.    ASSESSMENT & PLAN:

## 2010-11-18 NOTE — Assessment & Plan Note (Addendum)
Fluid status ok.  NYHA II-III.  Pt continues to exhibit functional limitations despite fluid status under control.  Will proceed with CPX study to get an objective measurement of her limitations.  Currently we will continue her current medications.  There was a long discussion regarding fluid restrictions and low sodium diet as well as obtaining a scale and the importance of daily weights and sliding scale lasix.  The importance of BP and insulin control was also discussed.  She is very motivated.  Currently there is little suspicion for sarcoid, no lung abnormalities noted on recent CT but she may need further work up in the future with a cardiac MRI but will start with CPX at this time.  She will also be referred to pulmonary for sleep apnea evaluation as this could be contributing significantly to her symptoms..   Patient seen and examined with Ulyess Blossom PA-C. We discussed all aspects of the encounter. I agree with the assessment and plan as stated above.

## 2010-11-19 ENCOUNTER — Ambulatory Visit (HOSPITAL_COMMUNITY): Payer: 59

## 2010-11-20 ENCOUNTER — Encounter: Payer: Self-pay | Admitting: Pulmonary Disease

## 2010-11-20 ENCOUNTER — Ambulatory Visit (INDEPENDENT_AMBULATORY_CARE_PROVIDER_SITE_OTHER): Payer: 59 | Admitting: Pulmonary Disease

## 2010-11-20 VITALS — BP 128/78 | HR 76 | Temp 98.1°F | Ht 67.0 in | Wt 265.8 lb

## 2010-11-20 DIAGNOSIS — G4733 Obstructive sleep apnea (adult) (pediatric): Secondary | ICD-10-CM

## 2010-11-20 NOTE — Progress Notes (Signed)
  Subjective:    Patient ID: Jasmine [name], female    DOB: 04-29-1970, 40 y.o.   MRN: [id number]  HPI  Cards- [email or web address]  40 year old obese woman, diabetic referred for evaluation of obstructive sleep apnea. She was recently hospitalized for diastolic congestive heart failure. She was diuresed 20 pounds and is back to baseline. She has class II symptoms. She reports trouble falling asleep and staying asleep. She works the second shift at [employer]. She always naps for 2 hours in the afternoon before going into work but wakes up feeling really tired and groggy. Bedtime is 2 to 3 AM, sleep latency study to 45 minutes, she does not use sleep aids. She has 2-5 awakenings and sometimes has to wake up and take a hot shower before going back to bed. She sleeps on her stomach with 2 pillows. Nocturia is present. She is out of bed at 9:30 AM feeling tired with dryness of mouth and occasional headache. Loud snoring has been noted by her fianc but no witnessed apneas. No bed partner history is available today. Epworth sleepiness score is 12/24. There is no history suggestive of cataplexy, sleep paralysis or parasomnias.    Review of Systems  Constitutional: Negative for fever and unexpected weight change.  HENT: Positive for congestion. Negative for ear pain, nosebleeds, sore throat, rhinorrhea, sneezing, trouble swallowing, dental problem, postnasal drip and sinus pressure.   Eyes: Negative for redness and itching.  Respiratory: Positive for cough and shortness of breath. Negative for chest tightness and wheezing.   Cardiovascular: Positive for leg swelling. Negative for palpitations.  Gastrointestinal: Negative for nausea and vomiting.  Genitourinary: Negative for dysuria.  Musculoskeletal: Negative for joint swelling.  Skin: Negative for rash.  Neurological: Negative for headaches.  Hematological: Does not bruise/bleed easily.  Psychiatric/Behavioral: Negative for dysphoric mood. The patient is not  nervous/anxious.        Objective:   Physical Exam  Gen. Pleasant, obese, in no distress, normal affect ENT - no lesions, no post nasal drip, class 2 airway Neck: No JVD, no thyromegaly, no carotid bruits Lungs: no use of accessory muscles, no dullness to percussion, clear without rales or rhonchi  Cardiovascular: Rhythm regular, heart sounds  normal, no murmurs or gallops, 1+ peripheral edema Abdomen: soft and non-tender, no hepatosplenomegaly, BS normal. Musculoskeletal: No deformities, no cyanosis or clubbing Neuro:  alert, non focal       Assessment & Plan:

## 2010-11-20 NOTE — Patient Instructions (Signed)
Schedule sleep study

## 2010-11-20 NOTE — Assessment & Plan Note (Signed)
Given excessive daytime somnolence, narrow pharyngeal exam, witnessed apneas & loud snoring, obstructive sleep apnea is very likely & an overnight polysomnogram will be scheduled as a split study. The pathophysiology of obstructive sleep apnea , it's cardiovascular consequences & modes of treatment including CPAP were discused with the patient in detail & they evidenced understanding.  Pretest probability is high and given her cardiovascular morbidity that will be a low threshold to treat even for mild disease.

## 2010-11-25 ENCOUNTER — Ambulatory Visit (HOSPITAL_BASED_OUTPATIENT_CLINIC_OR_DEPARTMENT_OTHER): Payer: No Typology Code available for payment source | Attending: Pulmonary Disease

## 2010-11-25 DIAGNOSIS — G4733 Obstructive sleep apnea (adult) (pediatric): Secondary | ICD-10-CM | POA: Insufficient documentation

## 2010-11-25 DIAGNOSIS — I519 Heart disease, unspecified: Secondary | ICD-10-CM | POA: Insufficient documentation

## 2010-11-25 DIAGNOSIS — E119 Type 2 diabetes mellitus without complications: Secondary | ICD-10-CM | POA: Insufficient documentation

## 2010-11-25 DIAGNOSIS — Z79899 Other long term (current) drug therapy: Secondary | ICD-10-CM | POA: Insufficient documentation

## 2010-11-25 DIAGNOSIS — N189 Chronic kidney disease, unspecified: Secondary | ICD-10-CM | POA: Insufficient documentation

## 2010-11-25 DIAGNOSIS — E669 Obesity, unspecified: Secondary | ICD-10-CM | POA: Insufficient documentation

## 2010-11-26 ENCOUNTER — Encounter (HOSPITAL_COMMUNITY): Payer: 59

## 2010-11-30 NOTE — Progress Notes (Signed)
Encounter addended by: Noralee Space, RN on: 11/30/2010 11:06 AM<BR>     Documentation filed: Orders

## 2010-12-04 ENCOUNTER — Telehealth: Payer: Self-pay | Admitting: Pulmonary Disease

## 2010-12-04 DIAGNOSIS — G4733 Obstructive sleep apnea (adult) (pediatric): Secondary | ICD-10-CM

## 2010-12-04 DIAGNOSIS — G4730 Sleep apnea, unspecified: Secondary | ICD-10-CM

## 2010-12-04 DIAGNOSIS — G4710 Hypersomnia, unspecified: Secondary | ICD-10-CM

## 2010-12-04 NOTE — Telephone Encounter (Signed)
PSG showed severe obstructive sleep apnea  Start  CPAP 10 cm with small fullf ace mask quattro, humidity, download in 1 month Order sent to DME

## 2010-12-04 NOTE — Procedures (Signed)
NAME:  Jasmine [name], CHAY NO.:  [id number]  MEDICAL RECORD NO.:  [id number]          PATIENT TYPE:  OUT  LOCATION:  SLEEP CENTER                 FACILITY[facility]  [facility]  PHYSICIAN:  Oretha Milch, MD      DATE OF BIRTH:  1970/02/16  DATE OF STUDY:  11/25/2010                           NOCTURNAL POLYSOMNOGRAM  REFERRING PHYSICIAN:  Oretha Milch, MD  INDICATION FOR STUDY:  Jasmine [name] is a 40 year old insulin-dependent diabetic with chronic renal insufficiency and diastolic dysfunction and obesity. She complains of snoring, excessive daytime fatigue, restless sleep, frequent napping, and witnessed apnea.  At the time of this study, she weighed 260 pounds with a height of 5 feet 7 inches, BMI of 41, neck size of 15.5 inches.  EPWORTH SLEEPINESS SCORE:  13.  MEDICATIONS:  Bedtime medications include Lasix, labetalol, ramipril, and iron at 9:30 p.m.  This intervention polysomnogram was performed with a sleep technologist in attendance.  EEG, EOG, EMG, EKG, and respiratory parameters were recorded.  Sleep stages arousals, limb movements, and respiratory data were scored according to criteria laid out by the American Academy of Sleep Medicine.  SLEEP ARCHITECTURE:  Lights out was at 10:41 p.m., lights on was at 4:57 a.m.  CPAP was initiated at 1:58 a.m.  During the diagnostic portion, total sleep time was 1:58 minutes with a sleep period time of 188 minutes with sleep efficiency of 81%.  Sleep latency was 9 minutes and latency to REM sleep was 66 minutes.  Sleep stages of the percentage of total sleep time was N1 7%, N2 77%, N3 1%, and REM sleep 15% (23 minutes).  Supine sleep accounting for 84 minutes.  During the titration portion, REM sleep accounted for 38 minutes, supine REM sleep was seen for 17 minutes.  The longest period of sleep was around 3:30 a.m.  RESPIRATORY DATA:  During the diagnostic portion, there were 4 obstructive apneas, 0 central apneas, 3 mixed apneas and  93 hypopneas with an apnea-hypopnea index of 30/80 events per hour and the lowest desaturation of 80%.  Due to this degree of respiratory disturbance, CPAP was initiated at 5 cm and titrated to a final goal of 10 cm.  A level of 9 cm for 42 minutes of non-REM sleep with 1 hypopnea was noted, the lowest desaturation of 93% and the final level of 10 cm for 15 minutes.  No events and no desaturations were noted.  This appears to be the optimal level used during the study.  AROUSAL DATA:  During the diagnostic portion, the arousal index was 39 events per hour.  During the titration portion, this was 19 events per hour, most of them being spontaneous.  OXYGEN SATURATION DATA:  The lowest desaturation was 80% during the titration portion.  The lowest desaturation was 83%.  No desaturations were noted on 10 cm of CPAP.  She spent 0.4 minutes with a saturation less than 88% on CPAP.  CARDIAC DATA:  The low heart rate was 40 beats per minute.  The high heart rate recorded was an artifact.  No arrhythmias were noted.  DISCUSSION:  She was desensitized with a small full-face Quattro mask at 3  cm of C-Flex.  No audible snoring was noted at the final level of CPAP.  She tolerated the CPAP well.  IMPRESSION-RECOMMENDATIONS: 1. Severe obstructive sleep apnea with hypopneas causing sleep     fragmentation and oxygen desaturation. 2. This was corrected by 10 cm of CPAP with 3 cm of C-Flex with a     small full-face mask. 3. No evidence of cardiac arrhythmias, limb movements, or behavioral     disturbance during sleep.  RECOMMENDATION: 1. The treatment option for this degree of sleep-disordered breathing     include weight loss and CPAP therapy. 2. CPAP can be initiated at 10 cm with a small full-face mask and     compliance monitored at this level. 3. She should be cautioned against driving when sleepy.  She should be     asked to avoid medications with sedative side effects.     Oretha Milch, MD    RVA/MEDQ  D:  12/04/2010 13:47:14  T:  12/04/2010 14:31:31  Job:  [id number]

## 2010-12-07 NOTE — Telephone Encounter (Signed)
I informed pt of RA's findings and recommendations. Pt verbalized understanding. Pt states she has not heard from DME yet. Per our notes pt's information sent to [facility]. I advised pt to call us is she has not heard back by mid-week.

## 2010-12-07 NOTE — Telephone Encounter (Signed)
Returning call.

## 2010-12-07 NOTE — Telephone Encounter (Signed)
[id number] x1 

## 2010-12-09 ENCOUNTER — Ambulatory Visit (HOSPITAL_COMMUNITY): Payer: 59 | Attending: Internal Medicine

## 2010-12-09 DIAGNOSIS — I5032 Chronic diastolic (congestive) heart failure: Secondary | ICD-10-CM | POA: Insufficient documentation

## 2010-12-09 DIAGNOSIS — I509 Heart failure, unspecified: Secondary | ICD-10-CM | POA: Insufficient documentation

## 2010-12-11 ENCOUNTER — Ambulatory Visit (HOSPITAL_COMMUNITY)
Admission: RE | Admit: 2010-12-11 | Discharge: 2010-12-11 | Disposition: A | Discharge status: Home / Self Care | Payer: 59 | Source: Ambulatory Visit | Attending: Internal Medicine | Admitting: Internal Medicine

## 2010-12-11 VITALS — BP 134/72 | HR 83 | Wt 270.0 lb

## 2010-12-11 DIAGNOSIS — I509 Heart failure, unspecified: Secondary | ICD-10-CM

## 2010-12-11 DIAGNOSIS — I5032 Chronic diastolic (congestive) heart failure: Secondary | ICD-10-CM

## 2010-12-11 NOTE — Patient Instructions (Addendum)
Continue to weigh and record daily  Follow up in 2-3 weeks   Please order PFTs at [facility]

## 2010-12-11 NOTE — Progress Notes (Signed)
HPI: Jasmine [name] is a 40 y.o [place] American female with history of nonobstructive CAD per cath 2010, HTN, uncontrolled DM and diastolic dysfunction. She has been evaluated by Dr. Trevor Iha approximately 2 years ago for chronic renal insufficiency. Per pt she underwent a renal u/s that was ok and no further w/u was completed because it was felt to be 2/2 hypertension and diabetes.  She was recently admitted to [facility] for acute on chronic HF. An echo on November 03, 2010, EF 55-60%, normal wall motion, without regional wall motion abnormalities. Mild LVH. Probably pseudonormal filling pressures. Trivial mitral regurgitation with mildly dilated left atrium. Echo on October 19, 2010 showed grade 1 diastolic dysfunction. V/Q scan on November 02, 2010, showing normal ventilation perfusion without evidence of PE. CT of the chest without contrast, showing suspected multifocal alveolar edema in the bilateral upper lobes and superior left lower lobe. Multifocal infection considered less likely. Cardiomegaly with bilateral pleural effusions. No findings to suggest interstitial lung disease.    11/14 CPX Peak VO2: 10.9 % predicted peak VO2: 57.2% (corrects to 19.6 for ideal body weight)                   VE/VCO2 slope: 52 (to peak exercise) 43 (to RCP)                   OUES: 1.08                   Peak RER: 1.19                   Ventilatory Threshold: 7.2 % predicted peak VO2: 37.6%                    O2pulse: 12 % predicted O2pulse: 90%  Sleep study completed 10/31. Dr Vassie Loll evaluated severe sleep apnea and CPAP will be set up today.  She is here for follow up. Tired all the time and SOB on exertion. Works for [employer] and she is tired trying to complete her job. SOB going up stairs. Denies dizziness/PND/orthopnea. Weight at home 262-271. She continues on Lasix 80 mg bid. Continue to sleep on 2 pillows at home. Tries to follow low salt but she does go out to eat at least 2x a week. Compliant with all  medications. She is not currently exercising.    ROS: All systems negative except as listed in HPI, PMH and Problem List.  Past Medical History  Diagnosis Date  . Diastolic heart failure     echo 11/03/10 EF 55-60%  . Hypertension   . Chronic kidney disease (CKD)     stage II-III  . Hyperlipidemia   . Uncontrolled diabetes mellitus   . Diabetic ketoacidosis     with seizures  . Anemia   . Diabetic gastroparesis   . Obesity   . Coronary artery disease     mild per cath 2010    Current Outpatient Prescriptions  Medication Sig Dispense Refill  . amLODipine (NORVASC) 10 MG tablet Take 10 mg by mouth daily.        Marland Kitchen aspirin 81 MG tablet Take 81 mg by mouth daily.        Marland Kitchen atorvastatin (LIPITOR) 40 MG tablet Take 40 mg by mouth Daily.      . ferrous sulfate 325 (65 FE) MG tablet Take 325 mg by mouth 2 (two) times daily.        Marland Kitchen FREESTYLE LITE test strip Twice  daily before meals.      . furosemide (LASIX) 80 MG tablet Take 80 mg by mouth 2 (two) times daily.        . hydrochlorothiazide (HYDRODIURIL) 12.5 MG tablet Take 12.5 mg by mouth Daily.      Marland Kitchen labetalol (NORMODYNE) 300 MG tablet Take 300 mg by mouth 2 (two) times daily.        . Lancets (FREESTYLE) lancets       . NOVOFINE 30G X 8 MM MISC       . NOVOLOG MIX 70/30 FLEXPEN (70-30) 100 UNIT/ML injection Inject 15 Units into the skin Twice daily before meals.      . ramipril (ALTACE) 10 MG tablet Take 10 mg by mouth at bedtime.           PHYSICAL EXAM: Filed Vitals:   12/11/10 0930  BP: 134/72  Pulse: 83   Weight change: 270.8 (265) General:  Well appearing. No resp difficulty HEENT: normal Neck: supple. JVP 6-7 Carotids 2+ bilaterally; no bruits. No lymphadenopathy or thryomegaly appreciated. Cor: PMI normal. Regular rate & rhythm. No rubs, gallops or murmurs. Lungs: clear Abdomen: obese soft, nontender, nondistended. No hepatosplenomegaly. No bruits or masses. Good bowel sounds. Extremities: no cyanosis, clubbing,  rash,  edema Neuro: alert & orientedx3, cranial nerves grossly intact. Moves all 4 extremities w/o difficulty. Affect pleasa   ASSESSMENT & PLAN:

## 2010-12-11 NOTE — Assessment & Plan Note (Addendum)
NYHA II-III. Volume status stable. CPAP will be started due to severe sleep apnea. CPX results reviewed and has evidence of significant circulatory limitations as well as due to obesity .Recommend continue current medical regimen for three months. She may require RHC to further evaluate hemodynamics if doesn't continue to improve. Continue current medical regimen. Will order full PFTs . Re-educated on low salt diet and fluid restrictions. Encouraged to start exercising. She will follow up in 2-3 weeks.  Patient seen and examined with Tonye Becket, NP. We discussed all aspects of the encounter. I agree with the assessment and plan as stated above.  We reviewed her CPX at length. She continues to improve but still has a significant circulatory limitation on CPX. May need RHC at some point to more fully evaluate.   Total MD time spent = 40 mins with over 50% of that time dedicated to counseling and discussions described above.

## 2010-12-23 ENCOUNTER — Ambulatory Visit (HOSPITAL_COMMUNITY)
Admission: RE | Admit: 2010-12-23 | Discharge: 2010-12-23 | Disposition: A | Discharge status: Home / Self Care | Payer: 59 | Source: Ambulatory Visit | Attending: Internal Medicine | Admitting: Internal Medicine

## 2010-12-23 DIAGNOSIS — R0602 Shortness of breath: Secondary | ICD-10-CM | POA: Insufficient documentation

## 2010-12-23 DIAGNOSIS — I5032 Chronic diastolic (congestive) heart failure: Secondary | ICD-10-CM

## 2010-12-23 DIAGNOSIS — R0609 Other forms of dyspnea: Secondary | ICD-10-CM | POA: Insufficient documentation

## 2010-12-23 DIAGNOSIS — R0989 Other specified symptoms and signs involving the circulatory and respiratory systems: Secondary | ICD-10-CM | POA: Insufficient documentation

## 2010-12-23 MED ORDER — ALBUTEROL SULFATE (5 MG/ML) 0.5% IN NEBU
2.5000 mg | INHALATION_SOLUTION | Freq: Once | RESPIRATORY_TRACT | Status: AC
  Administered 2010-12-23: 2.5 mg via RESPIRATORY_TRACT

## 2010-12-25 NOTE — Progress Notes (Signed)
Encounter addended by: Dolores Patty, MD on: 12/25/2010  3:15 PM<BR>     Documentation filed: Visit Diagnoses, Follow-up Section, LOS Section

## 2010-12-31 ENCOUNTER — Ambulatory Visit (INDEPENDENT_AMBULATORY_CARE_PROVIDER_SITE_OTHER): Payer: 59 | Admitting: Pulmonary Disease

## 2010-12-31 ENCOUNTER — Encounter: Payer: Self-pay | Admitting: Pulmonary Disease

## 2010-12-31 VITALS — BP 130/70 | HR 77 | Temp 98.1°F | Ht 67.0 in | Wt 277.4 lb

## 2010-12-31 DIAGNOSIS — G4733 Obstructive sleep apnea (adult) (pediatric): Secondary | ICD-10-CM

## 2010-12-31 NOTE — Progress Notes (Signed)
  Subjective:    Patient ID: Veera L Vanbuskirk, female    DOB: [birth year], 40 y.o.   MRN: [id number]  HPI Cards- [email or web address]  40 year old obese woman, diabetic for FU of obstructive sleep apnea.  She was hospitalized 9/12 for diastolic congestive heart failure. She was diuresed 20 pounds and is back to baseline. She has class II symptoms. She reports trouble falling asleep and staying asleep.  She works the second shift at [employer]. She always naps for 2 hours in the afternoon before going into work but wakes up feeling really tired and groggy.  Bedtime is 2 to 3 AM, sleep latency study to 45 minutes, she does not use sleep aids. She has 2-5 awakenings and sometimes has to wake up and take a hot shower before going back to bed. She sleeps on her stomach with 2 pillows. Nocturia is present. She is out of bed at 9:30 AM feeling tired with dryness of mouth and occasional headache. Loud snoring has been noted by her fianc but no witnessed apneas. No bed partner history is available today.  Epworth sleepiness score is 12/24.   12/31/2010  PSG - wt 260 lb-  showed severe obstructive sleep apnea with AHI of 50/h & nadir desatn of 80% corrected by CPAP 10 cm She was started on CPAP 10 cm with small full face mask quattro. She does not need humidity, initially only able to use 2 h , now upto 4-5h. Mask was causing headaches but better now, did not nap last 2 ds. No snoring, pressure ok, no dryness.   There is no history suggestive of cataplexy, sleep paralysis or parasomnias.       Review of Systems Patient denies significant dyspnea,cough, hemoptysis,  chest pain, palpitations, pedal edema, orthopnea, paroxysmal nocturnal dyspnea, lightheadedness, nausea, vomiting, abdominal or  leg pains      Objective:   Physical Exam  Gen. Pleasant, obese, in no distress ENT - no lesions, no post nasal drip, class 2 airway Neck: No JVD, no thyromegaly, no carotid bruits Lungs: no use of accessory muscles, no  dullness to percussion, decreased without rales or rhonchi  Cardiovascular: Rhythm regular, heart sounds  normal, no murmurs or gallops, no peripheral edema Musculoskeletal: No deformities, no cyanosis or clubbing , no tremors       Assessment & Plan:

## 2010-12-31 NOTE — Assessment & Plan Note (Signed)
Obtain download on 10 cm In future , she can try nasal pillows - she may be a mouth breather. Weight loss encouraged, compliance with goal of at least 4-6 hrs every night is the expectation. Advised against medications with sedative side effects Cautioned against driving when sleepy - understanding that sleepiness will vary on a day to day basis Benefits on CHF discussed.

## 2010-12-31 NOTE — Patient Instructions (Signed)
Would like to look at download information from your machine Trial of nasal pillows next time

## 2011-01-07 ENCOUNTER — Encounter (HOSPITAL_COMMUNITY): Payer: 59

## 2011-04-02 ENCOUNTER — Encounter (HOSPITAL_COMMUNITY): Payer: Self-pay | Admitting: *Deleted

## 2011-09-07 ENCOUNTER — Encounter (HOSPITAL_COMMUNITY): Payer: Self-pay | Admitting: *Deleted

## 2011-09-07 ENCOUNTER — Ambulatory Visit (HOSPITAL_COMMUNITY)
Admission: RE | Admit: 2011-09-07 | Discharge: 2011-09-07 | Disposition: A | Discharge status: Home / Self Care | Payer: 59 | Source: Ambulatory Visit | Attending: Internal Medicine | Admitting: Internal Medicine

## 2011-09-07 VITALS — BP 142/74 | HR 73 | Wt 273.2 lb

## 2011-09-07 DIAGNOSIS — M79604 Pain in right leg: Secondary | ICD-10-CM | POA: Insufficient documentation

## 2011-09-07 DIAGNOSIS — I739 Peripheral vascular disease, unspecified: Secondary | ICD-10-CM | POA: Insufficient documentation

## 2011-09-07 DIAGNOSIS — I5032 Chronic diastolic (congestive) heart failure: Secondary | ICD-10-CM | POA: Insufficient documentation

## 2011-09-07 DIAGNOSIS — M79609 Pain in unspecified limb: Secondary | ICD-10-CM | POA: Insufficient documentation

## 2011-09-07 DIAGNOSIS — M79605 Pain in left leg: Secondary | ICD-10-CM

## 2011-09-07 DIAGNOSIS — I10 Essential (primary) hypertension: Secondary | ICD-10-CM

## 2011-09-07 MED ORDER — HYDROCHLOROTHIAZIDE 12.5 MG PO TABS: 25.0000 mg | ORAL_TABLET | Freq: Every day | ORAL | Status: DC

## 2011-09-07 NOTE — Progress Notes (Signed)
PCP: Dr. Isabella Stalling  HPI: Jasmine [name] is a 41 y.o African American female with history of nonobstructive CAD per cath 2010, HTN, uncontrolled DM and diastolic dysfunction. She has been evaluated by Dr. Trevor Iha approximately 2 years ago for chronic renal insufficiency. Per pt she underwent a renal u/s that was ok and no further w/u was completed because it was felt to be 2/2 hypertension and diabetes.  She was recently admitted to [facility] for acute on chronic HF. An echo on November 03, 2010, EF 55-60%, normal wall motion, without regional wall motion abnormalities. Mild LVH. Probably pseudonormal filling pressures. Trivial mitral regurgitation with mildly dilated left atrium. Echo on October 19, 2010 showed grade 1 diastolic dysfunction. V/Q scan on November 02, 2010, showing normal ventilation perfusion without evidence of PE. CT of the chest without contrast, showing suspected multifocal alveolar edema in the bilateral upper lobes and superior left lower lobe. Multifocal infection considered less likely. Cardiomegaly with bilateral pleural effusions. No findings to suggest interstitial lung disease.    11/14 CPX Peak VO2: 10.9 % predicted peak VO2: 57.2% (corrects to 19.6 for ideal body weight)                   VE/VCO2 slope: 52 (to peak exercise) 43 (to RCP)                   OUES: 1.08                   Peak RER: 1.19                   Ventilatory Threshold: 7.2 % predicted peak VO2: 37.6%                    O2pulse: 12 % predicted O2pulse: 90%  Sleep study completed 10/31. Dr Vassie Loll evaluated severe sleep apnea, placed on CPAP.  She is here for follow up today.  She has been having increased pain in her legs.  She says she is unable to walk from her car parked 6 spots out into [employer] without stopping to rest because her legs feel so heavy and burn.  She says her breathing does not bother her on exertion.  She is unable to exercise because of her leg pain.  She has also noted increased swelling in her  legs and feet over the last week.  She notes fluid indiscretions because increased fluid intake due to her air condition not working.  Weight up 8 pounds.  She is wearing her CPAP 75% of the time.    ROS: All systems negative except as listed in HPI, PMH and Problem List.  Past Medical History  Diagnosis Date  . Diastolic heart failure     echo 11/03/10 EF 55-60%  . Hypertension   . Chronic kidney disease (CKD)     stage II-III  . Hyperlipidemia   . Uncontrolled diabetes mellitus   . Diabetic ketoacidosis     with seizures  . Anemia   . Diabetic gastroparesis   . Obesity   . Coronary artery disease     mild per cath 2010    Current Outpatient Prescriptions  Medication Sig Dispense Refill  . amLODipine (NORVASC) 10 MG tablet Take 10 mg by mouth daily.        Marland Kitchen aspirin 81 MG tablet Take 81 mg by mouth daily.        Marland Kitchen atorvastatin (LIPITOR) 40 MG tablet Take 40 mg by  mouth Daily.      . DULoxetine (CYMBALTA) 20 MG capsule Take 20 mg by mouth daily.      . ferrous sulfate 325 (65 FE) MG tablet Take 325 mg by mouth 2 (two) times daily.        Marland Kitchen FREESTYLE LITE test strip Twice daily before meals.      . furosemide (LASIX) 80 MG tablet Take 80 mg by mouth 2 (two) times daily.        . hydrochlorothiazide (HYDRODIURIL) 12.5 MG tablet Take 12.5 mg by mouth Daily.      Marland Kitchen labetalol (NORMODYNE) 300 MG tablet Take 300 mg by mouth 2 (two) times daily.        . Lancets (FREESTYLE) lancets       . NOVOFINE 30G X 8 MM MISC       . NOVOLOG MIX 70/30 FLEXPEN (70-30) 100 UNIT/ML injection Inject into the skin Twice daily before meals. 40 units in the morning and 30 units in the evening      . ramipril (ALTACE) 10 MG tablet Take 10 mg by mouth at bedtime.           PHYSICAL EXAM: Filed Vitals:   09/07/11 1047  BP: 142/74  Pulse: 73  Weight: 273 lb 4 oz (123.945 kg)  SpO2: 100%    General:  Well appearing. No resp difficulty HEENT: normal Neck: supple. JVP 6-7 Carotids 2+ bilaterally; no  bruits. No lymphadenopathy or thryomegaly appreciated. Cor: PMI normal. Regular rate & rhythm. No rubs, gallops or murmurs. Lungs: clear Abdomen: obese soft, nontender, nondistended. No hepatosplenomegaly. No bruits or masses. Good bowel sounds. Extremities: no cyanosis, clubbing, rash, trace edema, TTP in lower extremity  Neuro: alert & orientedx3, cranial nerves grossly intact. Moves all 4 extremities w/o difficulty. Affect pleasa   ASSESSMENT & PLAN:

## 2011-09-07 NOTE — Patient Instructions (Addendum)
Increase hydralazine 25 mg (2 tabs) daily.  Follow up 3-4 weeks.    Labs today  Your physician has requested that you have a lower extremity arterial duplex. This test is an ultrasound of the arteries in the legs or arms. It looks at arterial blood flow in the legs and arms. Allow one hour for Lower and Upper Arterial scans. There are no restrictions or special instructions.  Tuesday 8/20 AT 10:30 AT [facility]

## 2011-09-07 NOTE — Assessment & Plan Note (Signed)
Leg pain concerning for arterial claudication.  Will schedule arterial dopplers and ABIs.  Follow up with results.

## 2011-09-07 NOTE — Assessment & Plan Note (Addendum)
Volume status mildly elevated today.  Will increase HCTZ 25 mg daily to help with fluid and hypertension.  Follow weights closely.  Discussed need for fluid restrictions and alternatives to drinking water all day.  Follow up 3 weeks.  Labs today.

## 2011-09-07 NOTE — Assessment & Plan Note (Signed)
As above, increase hydralazine 25 mg daily.

## 2011-09-14 ENCOUNTER — Encounter (INDEPENDENT_AMBULATORY_CARE_PROVIDER_SITE_OTHER): Payer: 59

## 2011-09-14 DIAGNOSIS — I739 Peripheral vascular disease, unspecified: Secondary | ICD-10-CM

## 2011-09-14 DIAGNOSIS — I70219 Atherosclerosis of native arteries of extremities with intermittent claudication, unspecified extremity: Secondary | ICD-10-CM

## 2011-09-22 ENCOUNTER — Ambulatory Visit: Payer: 59 | Admitting: Endocrinology

## 2011-09-23 ENCOUNTER — Encounter (HOSPITAL_COMMUNITY): Payer: Self-pay | Admitting: *Deleted

## 2011-09-29 ENCOUNTER — Encounter (HOSPITAL_COMMUNITY): Payer: 59

## 2011-10-04 ENCOUNTER — Ambulatory Visit: Payer: 59 | Admitting: Endocrinology

## 2011-10-04 DIAGNOSIS — Z0289 Encounter for other administrative examinations: Secondary | ICD-10-CM

## 2011-10-14 ENCOUNTER — Ambulatory Visit (HOSPITAL_COMMUNITY): Admission: RE | Admit: 2011-10-14 | Payer: 59 | Source: Ambulatory Visit

## 2012-01-17 ENCOUNTER — Emergency Department (HOSPITAL_COMMUNITY): Payer: 59

## 2012-01-17 ENCOUNTER — Other Ambulatory Visit: Payer: Self-pay

## 2012-01-17 ENCOUNTER — Encounter (HOSPITAL_COMMUNITY): Payer: Self-pay | Admitting: Emergency Medicine

## 2012-01-17 ENCOUNTER — Inpatient Hospital Stay (HOSPITAL_COMMUNITY)
Admission: EM | Admit: 2012-01-17 | Discharge: 2012-01-20 | DRG: 291 | Disposition: A | Discharge status: Home / Self Care | Payer: 59 | Attending: Internal Medicine | Admitting: Internal Medicine

## 2012-01-17 DIAGNOSIS — N184 Chronic kidney disease, stage 4 (severe): Secondary | ICD-10-CM | POA: Diagnosis present

## 2012-01-17 DIAGNOSIS — IMO0001 Reserved for inherently not codable concepts without codable children: Secondary | ICD-10-CM | POA: Diagnosis present

## 2012-01-17 DIAGNOSIS — E785 Hyperlipidemia, unspecified: Secondary | ICD-10-CM | POA: Diagnosis present

## 2012-01-17 DIAGNOSIS — N390 Urinary tract infection, site not specified: Secondary | ICD-10-CM | POA: Diagnosis present

## 2012-01-17 DIAGNOSIS — G4733 Obstructive sleep apnea (adult) (pediatric): Secondary | ICD-10-CM | POA: Diagnosis present

## 2012-01-17 DIAGNOSIS — M67919 Unspecified disorder of synovium and tendon, unspecified shoulder: Secondary | ICD-10-CM | POA: Diagnosis present

## 2012-01-17 DIAGNOSIS — M719 Bursopathy, unspecified: Secondary | ICD-10-CM | POA: Diagnosis present

## 2012-01-17 DIAGNOSIS — I5033 Acute on chronic diastolic (congestive) heart failure: Secondary | ICD-10-CM | POA: Diagnosis present

## 2012-01-17 DIAGNOSIS — I509 Heart failure, unspecified: Principal | ICD-10-CM | POA: Diagnosis present

## 2012-01-17 DIAGNOSIS — F32A Depression, unspecified: Secondary | ICD-10-CM | POA: Diagnosis present

## 2012-01-17 DIAGNOSIS — Z91199 Patient's noncompliance with other medical treatment and regimen due to unspecified reason: Secondary | ICD-10-CM

## 2012-01-17 DIAGNOSIS — R079 Chest pain, unspecified: Secondary | ICD-10-CM

## 2012-01-17 DIAGNOSIS — D638 Anemia in other chronic diseases classified elsewhere: Secondary | ICD-10-CM | POA: Diagnosis present

## 2012-01-17 DIAGNOSIS — Z794 Long term (current) use of insulin: Secondary | ICD-10-CM

## 2012-01-17 DIAGNOSIS — Z9119 Patient's noncompliance with other medical treatment and regimen: Secondary | ICD-10-CM

## 2012-01-17 DIAGNOSIS — Z6841 Body Mass Index (BMI) 40.0 and over, adult: Secondary | ICD-10-CM

## 2012-01-17 DIAGNOSIS — F329 Major depressive disorder, single episode, unspecified: Secondary | ICD-10-CM | POA: Diagnosis present

## 2012-01-17 DIAGNOSIS — I2510 Atherosclerotic heart disease of native coronary artery without angina pectoris: Secondary | ICD-10-CM | POA: Diagnosis present

## 2012-01-17 DIAGNOSIS — E109 Type 1 diabetes mellitus without complications: Secondary | ICD-10-CM

## 2012-01-17 DIAGNOSIS — N179 Acute kidney failure, unspecified: Secondary | ICD-10-CM | POA: Diagnosis present

## 2012-01-17 DIAGNOSIS — D649 Anemia, unspecified: Secondary | ICD-10-CM

## 2012-01-17 DIAGNOSIS — I130 Hypertensive heart and chronic kidney disease with heart failure and stage 1 through stage 4 chronic kidney disease, or unspecified chronic kidney disease: Principal | ICD-10-CM | POA: Diagnosis present

## 2012-01-17 LAB — CBC
HCT: 24.1 % — ABNORMAL LOW (ref 36.0–46.0)
MCH: 27.5 pg (ref 26.0–34.0)
MCHC: 32.8 g/dL (ref 30.0–36.0)
MCV: 84 fL (ref 78.0–100.0)
RDW: 14.4 % (ref 11.5–15.5)

## 2012-01-17 LAB — BASIC METABOLIC PANEL
BUN: 61 mg/dL — ABNORMAL HIGH (ref 6–23)
Chloride: 107 mEq/L (ref 96–112)
GFR calc Af Amer: 33 mL/min — ABNORMAL LOW (ref >90)
Glucose, Bld: 188 mg/dL — ABNORMAL HIGH (ref 70–99)
Potassium: 4.4 mEq/L (ref 3.5–5.1)
Sodium: 135 mEq/L (ref 135–145)

## 2012-01-17 LAB — PRO B NATRIURETIC PEPTIDE: Pro B Natriuretic peptide (BNP): 1510 pg/mL — ABNORMAL HIGH (ref 0–125)

## 2012-01-17 MED ORDER — ASPIRIN 81 MG PO CHEW
162.0000 mg | CHEWABLE_TABLET | Freq: Once | ORAL | Status: AC
  Administered 2012-01-17: 162 mg via ORAL
  Filled 2012-01-17: qty 2

## 2012-01-17 MED ORDER — FUROSEMIDE 10 MG/ML IJ SOLN
80.0000 mg | Freq: Once | INTRAMUSCULAR | Status: AC
  Administered 2012-01-17: 80 mg via INTRAVENOUS
  Filled 2012-01-17: qty 4

## 2012-01-17 MED ORDER — HYDROCODONE-ACETAMINOPHEN 5-325 MG PO TABS
1.0000 | ORAL_TABLET | Freq: Once | ORAL | Status: AC
  Administered 2012-01-17: 1 via ORAL
  Filled 2012-01-17 (×2): qty 1

## 2012-01-17 NOTE — ED Provider Notes (Addendum)
History     CSN: [id number]  Arrival date & time 01/17/12  1759   First MD Initiated Contact with Patient 01/17/12 1909      Chief Complaint  Patient presents with  . Shortness of Breath    (Consider location/radiation/quality/duration/timing/severity/associated sxs/prior treatment) HPI Comments: Pt with CHF (EF - 55%), HTN, diabetes comes in with cc of sob. Pt is on lasix 80 mg bid, and states that she has been compliant with all her meds, and that there have been no recent changes. She has been feeling sob for the past few days, and it is progressively getting worse. Pt has dyspnea even while at rest now, and at baseline she is quite functional. She also has started having PND starting last night, and has mild lower extremity swelling worse than usual. Mild midsternal chest pain - constant, non radiating and not exertional. She has no hx of PE, DVT and has had a neg v/q scan before. No lung dz, no illicit use.  Patient is a 41 y.o. female presenting with shortness of breath. The history is provided by the patient.  Shortness of Breath  Associated symptoms include chest pain and shortness of breath. Pertinent negatives include no cough and no wheezing.    Past Medical History  Diagnosis Date  . Diastolic heart failure     echo 11/03/10 EF 55-60%  . Hypertension   . Chronic kidney disease (CKD)     stage II-III  . Hyperlipidemia   . Uncontrolled diabetes mellitus   . Diabetic ketoacidosis     with seizures  . Anemia   . Diabetic gastroparesis   . Obesity   . Coronary artery disease     mild per cath 2010    Past Surgical History  Procedure Date  . Cholecystectomy     Family History  Problem Relation Age of Onset  . Heart attack Mother   . Dementia Father     History  Substance Use Topics  . Smoking status: Never Smoker   . Smokeless tobacco: Never Used  . Alcohol Use: Yes     Comment: occasionally    OB History    Grav Para Term Preterm Abortions TAB  SAB Ect Mult Living                  Review of Systems  Constitutional: Negative for activity change.  HENT: Negative for facial swelling and neck pain.   Respiratory: Positive for chest tightness and shortness of breath. Negative for cough and wheezing.   Cardiovascular: Positive for chest pain.  Gastrointestinal: Negative for nausea, vomiting, abdominal pain, diarrhea, constipation, blood in stool and abdominal distention.  Genitourinary: Negative for hematuria and difficulty urinating.  Skin: Negative for color change.  Neurological: Negative for speech difficulty.  Hematological: Does not bruise/bleed easily.  Psychiatric/Behavioral: Negative for confusion.    Allergies  Contrast media; Oxycodone; and Paroxetine  Home Medications   Current Outpatient Rx  Name  Route  Sig  Dispense  Refill  . AMLODIPINE BESYLATE 10 MG PO TABS   Oral   Take 10 mg by mouth daily.           . ASPIRIN 81 MG PO TABS   Oral   Take 81 mg by mouth daily.           . ATORVASTATIN CALCIUM 40 MG PO TABS   Oral   Take 40 mg by mouth Daily.         Marland Kitchen FERROUS SULFATE  325 (65 FE) MG PO TABS   Oral   Take 325 mg by mouth 2 (two) times daily.           Marland Kitchen FREESTYLE LITE TEST VI STRP      Twice daily before meals.         . FUROSEMIDE 80 MG PO TABS   Oral   Take 80 mg by mouth 2 (two) times daily.           Marland Kitchen HYDROCHLOROTHIAZIDE 12.5 MG PO TABS   Oral   Take 2 tablets (25 mg total) by mouth daily.   60 tablet   6   . LABETALOL HCL 300 MG PO TABS   Oral   Take 300 mg by mouth 2 (two) times daily.           Marland Kitchen FREESTYLE LANCETS MISC               . NOVOFINE 30G X 8 MM MISC               . NOVOLOG MIX 70/30 FLEXPEN (70-30) 100 UNIT/ML [place] SUSP   Subcutaneous   Inject into the skin Twice daily before meals. 40 units in the morning and 30 units in the evening         . RAMIPRIL 10 MG PO TABS   Oral   Take 10 mg by mouth at bedtime.             BP 180/50   Pulse 104  Temp 98 F (36.7 C) (Oral)  Resp 21  SpO2 100%  LMP 01/16/2012  Physical Exam  Nursing note and vitals reviewed. Constitutional: She is oriented to person, place, and time. She appears well-developed.  HENT:  Head: Normocephalic and atraumatic.  Eyes: Conjunctivae normal and EOM are normal. Pupils are equal, round, and reactive to light.  Neck: Normal range of motion. Neck supple. JVD present.  Cardiovascular: Normal rate, regular rhythm and normal heart sounds.  Exam reveals no gallop.   Pulmonary/Chest: Effort normal. No respiratory distress. She has rales.       Worse on the left side than right side, extending up to the mid thorax, tachypnea  Abdominal: Soft. Bowel sounds are normal. She exhibits no distension. There is no tenderness. There is no rebound and no guarding.  Musculoskeletal: She exhibits edema. She exhibits no tenderness.       Trace to 1+ equal pitting edema bilaterally.  Neurological: She is alert and oriented to person, place, and time.  Skin: Skin is warm and dry.    ED Course  Procedures (including critical care time)  Labs Reviewed  BASIC METABOLIC PANEL - Abnormal; Notable for the following:    CO2 17 (*)     Glucose, Bld 188 (*)     BUN 61 (*)     Creatinine, Ser 2.08 (*)     GFR calc non Af Amer 28 (*)     GFR calc Af Amer 33 (*)     All other components within normal limits  PRO B NATRIURETIC PEPTIDE - Abnormal; Notable for the following:    Pro B Natriuretic peptide (BNP) 1510.0 (*)     All other components within normal limits  CBC - Abnormal; Notable for the following:    WBC 11.1 (*)     RBC 2.87 (*)     Hemoglobin 7.9 (*)     HCT 24.1 (*)     All other components within normal limits  TROPONIN I   No results found.   No diagnosis found.    MDM   Date: 01/17/2012  Rate: 98  Rhythm: normal sinus rhythm  QRS Axis: normal  Intervals: normal  ST/T Wave abnormalities: nonspecific ST/T changes  Conduction  Disutrbances:none  Narrative Interpretation:   Old EKG Reviewed: unchanged  Differential diagnosis includes: ACS syndrome CHF exacerbation Valvular disorder Myocarditis Pericarditis Pericardial effusion Pneumonia Pleural effusion Pulmonary edema PE Anemia Musculoskeletal pain  Pt comes in with cc of SOB. Tachypnea, and has clinical CHF exacerbation. Based on hx, PE suspicion low, in light of a neg v/q scan and no other risk factors for PE. Will get basic workup.  8:59 PM Dr. Lurline Idol, Cardiology accepting at [facility]. Lasix 80 mg iv given. AKI also noted.....along with anemia. Pt denies any blood loss.   Derwood Kaplan, MD 01/17/12 2100  Derwood Kaplan, MD 01/18/12 [id number]

## 2012-01-17 NOTE — ED Notes (Signed)
Pt reports diastolic heart failure and occasionally gets fluid build up.  Pt started with SOB yesterday.  Patient reports she takes Lasix, HCTZ regularly.  Pt placed on 2 LO2

## 2012-01-17 NOTE — H&P (Signed)
Physician History and Physical    Jasmine [name] MRN: [id number] DOB/AGE: [birth year] 41 y.o. Admit date: 01/17/2012  Primary Cardiologist:  Dr. Gala Romney  CC:  Shortness of breath  HPI:  Pt is a 41 yo woman with diastolic HF, CKD, HTN, DM, OSA who presents with 3 days of SOB.  Pt admits that she has been eating salty foods lately.  She has eaten ham on 2 occasions, the last being 3 days prior to admission.  She has not been weighing herself because her scale broke. Her sx started off with a cough.  The cough has continued and she now has CP on coughing.  She has PND and orthopnea as well as increasing LE edema over the same 3 day time period.  She was sleeping in a chair the day prior to admission.  She denies fevers, chills, sick contacts.  She had tendinitis in her L shoulder that flares up - she has been taking motrin daily for this pain.  She also uses a heating pad for this at times as well.  She received 80 mg IV lasix in the ED and has been urinating every hour since then and she is starting to feel a little better.  She has no other cardiac complaints.  She was found to have slightly worsening Hgb from baseline - she denies any signs/sx of GI bleeding, though she reports she is menstruating right now.  Review of systems: A review of 10 organ systems was done and is negative except as stated above in HPI  Past Medical History  Diagnosis Date  . Diastolic heart failure     echo 11/03/10 EF 55-60%  . Hypertension   . Chronic kidney disease (CKD)     stage II-III  . Hyperlipidemia   . Uncontrolled diabetes mellitus   . Diabetic ketoacidosis     with seizures  . Anemia   . Diabetic gastroparesis   . Obesity   . Coronary artery disease     mild per cath 2010   Past Surgical History  Procedure Date  . Cholecystectomy    History   Social History  . Marital Status: Single    Spouse Name: N/A    Number of Children: N/A  . Years of Education: N/A   Occupational History    . Not on file.   Social History Main Topics  . Smoking status: Never Smoker   . Smokeless tobacco: Never Used  . Alcohol Use: Yes     Comment: occasionally  . Drug Use: Not on file  . Sexually Active: Not on file   Other Topics Concern  . Not on file   Social History Narrative   She lives with her boyfriend in [place].      Family History  Problem Relation Age of Onset  . Heart attack Mother   . Dementia Father      Allergies  Allergen Reactions  . Contrast Media (Iodinated Diagnostic Agents)   . Oxycodone Itching  . Paroxetine     REACTION: GI Upset     (Not in a hospital admission)  No current facility-administered medications for this encounter. Current outpatient prescriptions:amLODipine (NORVASC) 10 MG tablet, Take 10 mg by mouth daily.  , Disp: , Rfl: ;  aspirin 81 MG tablet, Take 81 mg by mouth daily.  , Disp: , Rfl: ;  atorvastatin (LIPITOR) 40 MG tablet, Take 40 mg by mouth Daily., Disp: , Rfl: ;  ferrous sulfate  325 (65 FE) MG tablet, Take 325 mg by mouth 2 (two) times daily.  , Disp: , Rfl: ;  FREESTYLE LITE test strip, Twice daily before meals., Disp: , Rfl:  furosemide (LASIX) 80 MG tablet, Take 80 mg by mouth 2 (two) times daily.  , Disp: , Rfl: ;  hydrochlorothiazide (HYDRODIURIL) 12.5 MG tablet, Take 2 tablets (25 mg total) by mouth daily., Disp: 60 tablet, Rfl: 6;  labetalol (NORMODYNE) 300 MG tablet, Take 300 mg by mouth 2 (two) times daily.  , Disp: , Rfl: ;  Lancets (FREESTYLE) lancets, , Disp: , Rfl: ;  NOVOFINE 30G X 8 MM MISC, , Disp: , Rfl:  NOVOLOG MIX 70/30 FLEXPEN (70-30) 100 UNIT/ML injection, Inject into the skin Twice daily before meals. 40 units in the morning and 30 units in the evening, Disp: , Rfl: ;  ramipril (ALTACE) 10 MG tablet, Take 10 mg by mouth at bedtime.  , Disp: , Rfl:   Physical Exam: Blood pressure 176/65, pulse 112, temperature 97.9 F (36.6 C), temperature source Oral, resp. rate 20, last menstrual period 01/16/2012, SpO2  100.00%.; There is no height or weight on file to calculate BMI. Temp:  [97.9 F (36.6 C)] 97.9 F (36.6 C) (12/23 1805) Pulse Rate:  [112] 112  (12/23 1828) Resp:  [20] 20  (12/23 1805) BP: (176)/(65) 176/65 mmHg (12/23 1805) SpO2:  [100 %] 100 % (12/23 1805)  No intake or output data in the 24 hours ending 01/17/12 2037 General: NAD Heent: MMM Neck: JVP 14cm  CV: RRR, no m  Lungs: Clear to auscultation bilaterally with normal respiratory effort, pt is coughing incessantly Abdomen: obese, Soft, nontender, nondistended Extremities: No clubbing or cyanosis.  2-3+ pitting edema bilat Skin: Intact without lesions or rashes  Neurologic: Alert and oriented, grossly nonfocal  Psych: Normal mood and affect    Labs:  [facility] 01/17/12 1910  CKTOTAL --  CKMB --  TROPONINI <0.30   Lab Results  Component Value Date   WBC 11.1* 01/17/2012   HGB 7.9* 01/17/2012   HCT 24.1* 01/17/2012   MCV 84.0 01/17/2012   PLT 315 01/17/2012    Lab 01/17/12 1910  NA 135  K 4.4  CL 107  CO2 17*  BUN 61*  CREATININE 2.08*  CALCIUM 8.8  PROT --  BILITOT --  ALKPHOS --  ALT --  AST --  GLUCOSE 188*   Lab Results  Component Value Date   CHOL 314* 11/03/2010   HDL 35* 11/03/2010   LDLCALC 247* 11/03/2010   TRIG 159* 11/03/2010     Baseline Cr 1.7-1.9 in 10/12 BNP 1000-1500 in 10/12 Hgb 8-9 in 10/12  EKG: 01/17/12 1819 sinus 95, nonspec ST changes, no change from prior EKG 11/07/10  CXR  IMPRESSION:  Vascular congestion, with increased interstitial markings, raising  concern for mild interstitial edema. Likely small left pleural  effusion; peribronchial thickening noted.   ASSESSMENT:  Pt is a 41 yo woman with diastolic HF, CKD, HTN, DM, OSA who presents with diastolic HF exacerbation  PLAN: Diastolic HF exacerbation - elevated BNP, sx of SOB, PND, orthopnea, LE edema; likely trigger is dietary indiscretion and daily NSAID use - lasix 80 mg IV given in ED, will continue with 80 IV  BID (this is double pt's home dose) and monitor output - strict I/Os, daily wts - goal 1-2 L negative  - BID elytes - there was some consideration in past notes for [facility] to better define hemos - will discuss  with Dr. Gala Romney to see if he wants this done this admission  Chest pain - likely MSK in etiology from coughing - first troponin negative, finish rule out with serial trops  Anemia - most recent baseline Hgb 8-9 - pt is slightly below that today.  No active signs of GI bleeding.  Pt is menstruating.  Anemia is likely multifactorial (CKD, menstruation, dilution from being fluid up) and is probably contributing to her SOB.  Continue to monitor, transfuse if Hgb <7.  Continue home Iron supplementation  CKD - most recent baseline 1.7-1.9 in 2012 - Cr is 2 today, this could just be progression of disease, but more likely cause is fluid congestion and daily NSAIDs.  Monitor closely for changes with diuresis.  Pt will need to follow up with her nephrologist as an outpt.  Advised her to not use daily NSAIDs   Elevated WBC - no signs of acute infection.  Will monitor  HTN - BP elevated, could have contributed to pulm edema with diastolic dysfunction.  Continue home meds, may need to uptitrate if BP continues to be uncontrolled  DM - continue home insulin, SSI  HLD - continue statin  L shoulder tendinitis - start extra strength tylenol, hold NSAIDs given HF exacerbation and elevated Cr as above  Dispo  - admit to [facility] service  - heparin for DVT ppx  Signed: Hilary Hertz, MD Cardiology Fellow 01/17/2012, 8:37 PM

## 2012-01-17 NOTE — ED Notes (Signed)
[id number]<AV> Expected date:<BR> Expected time:<BR> Means of arrival:<BR> Comments:<BR> Triage 2

## 2012-01-17 NOTE — ED Notes (Signed)
Report called to charge nurse at [facility], Helyn App RN  [facility] notified of need to transport.

## 2012-01-17 NOTE — ED Notes (Signed)
Per patient, was short of breath several times last night-increased throughout day while at work

## 2012-01-17 NOTE — ED Notes (Signed)
EKG shown to Dr. Nanavati 

## 2012-01-18 DIAGNOSIS — D649 Anemia, unspecified: Secondary | ICD-10-CM

## 2012-01-18 DIAGNOSIS — I517 Cardiomegaly: Secondary | ICD-10-CM

## 2012-01-18 DIAGNOSIS — D638 Anemia in other chronic diseases classified elsewhere: Secondary | ICD-10-CM | POA: Diagnosis present

## 2012-01-18 DIAGNOSIS — N179 Acute kidney failure, unspecified: Secondary | ICD-10-CM | POA: Insufficient documentation

## 2012-01-18 DIAGNOSIS — N189 Chronic kidney disease, unspecified: Secondary | ICD-10-CM

## 2012-01-18 DIAGNOSIS — I5033 Acute on chronic diastolic (congestive) heart failure: Secondary | ICD-10-CM | POA: Diagnosis present

## 2012-01-18 LAB — BASIC METABOLIC PANEL
BUN: 57 mg/dL — ABNORMAL HIGH (ref 6–23)
BUN: 63 mg/dL — ABNORMAL HIGH (ref 6–23)
CO2: 16 mEq/L — ABNORMAL LOW (ref 19–32)
CO2: 18 mEq/L — ABNORMAL LOW (ref 19–32)
Chloride: 106 mEq/L (ref 96–112)
Chloride: 103 mEq/L (ref 96–112)
Creatinine, Ser: 2.08 mg/dL — ABNORMAL HIGH (ref 0.50–1.10)
GFR calc Af Amer: 37 mL/min — ABNORMAL LOW (ref >90)
Glucose, Bld: 112 mg/dL — ABNORMAL HIGH (ref 70–99)
Potassium: 4.1 mEq/L (ref 3.5–5.1)
Potassium: 4 mEq/L (ref 3.5–5.1)

## 2012-01-18 LAB — URINALYSIS, ROUTINE W REFLEX MICROSCOPIC
Bilirubin Urine: NEGATIVE
Protein, ur: NEGATIVE mg/dL
Urobilinogen, UA: 0.2 mg/dL (ref 0.0–1.0)

## 2012-01-18 LAB — RETICULOCYTES
RBC.: 2.99 MIL/uL — ABNORMAL LOW (ref 3.87–5.11)
Retic Count, Absolute: 62.8 10*3/uL (ref 19.0–186.0)

## 2012-01-18 LAB — URINE MICROSCOPIC-ADD ON

## 2012-01-18 LAB — IRON AND TIBC
Saturation Ratios: 6 % — ABNORMAL LOW (ref 20–55)
Saturation Ratios: 6 % — ABNORMAL LOW (ref 20–55)
TIBC: 287 ug/dL (ref 250–470)

## 2012-01-18 LAB — GLUCOSE, CAPILLARY: Glucose-Capillary: 71 mg/dL (ref 70–99)

## 2012-01-18 LAB — VITAMIN B12: Vitamin B-12: 473 pg/mL (ref 211–911)

## 2012-01-18 LAB — FOLATE: Folate: 12.1 ng/mL

## 2012-01-18 MED ORDER — ONDANSETRON HCL 4 MG/2ML IJ SOLN: 4.0000 mg | Freq: Four times a day (QID) | INTRAMUSCULAR | Status: DC | PRN

## 2012-01-18 MED ORDER — SODIUM CHLORIDE 0.9 % IJ SOLN: 3.0000 mL | INTRAMUSCULAR | Status: DC | PRN

## 2012-01-18 MED ORDER — ACETAMINOPHEN 500 MG PO TABS
1000.0000 mg | ORAL_TABLET | Freq: Three times a day (TID) | ORAL | Status: DC | PRN
  Filled 2012-01-18: qty 2

## 2012-01-18 MED ORDER — FERROUS SULFATE 325 (65 FE) MG PO TABS
325.0000 mg | ORAL_TABLET | Freq: Two times a day (BID) | ORAL | Status: DC
Start: 2012-01-18 — End: 2012-01-20
  Administered 2012-01-18 – 2012-01-20 (×5): 325 mg via ORAL
  Filled 2012-01-18 (×6): qty 1

## 2012-01-18 MED ORDER — ACETAMINOPHEN 325 MG PO TABS
650.0000 mg | ORAL_TABLET | ORAL | Status: DC | PRN
  Administered 2012-01-18: 650 mg via ORAL
  Filled 2012-01-18: qty 2

## 2012-01-18 MED ORDER — SODIUM BICARBONATE 650 MG PO TABS
1300.0000 mg | ORAL_TABLET | Freq: Two times a day (BID) | ORAL | Status: DC
  Administered 2012-01-18 – 2012-01-20 (×5): 1300 mg via ORAL
  Filled 2012-01-18 (×6): qty 2

## 2012-01-18 MED ORDER — AMLODIPINE BESYLATE 10 MG PO TABS
10.0000 mg | ORAL_TABLET | Freq: Every day | ORAL | Status: DC
  Administered 2012-01-18: 10 mg via ORAL
  Filled 2012-01-18: qty 1

## 2012-01-18 MED ORDER — ONDANSETRON HCL 4 MG/2ML IJ SOLN
4.0000 mg | Freq: Three times a day (TID) | INTRAMUSCULAR | Status: DC | PRN
Start: 2012-01-18 — End: 2012-01-18

## 2012-01-18 MED ORDER — RAMIPRIL 10 MG PO CAPS
10.0000 mg | ORAL_CAPSULE | Freq: Every day | ORAL | Status: DC
  Administered 2012-01-18 – 2012-01-19 (×2): 10 mg via ORAL
  Filled 2012-01-18 (×3): qty 1

## 2012-01-18 MED ORDER — SODIUM CHLORIDE 0.9 % IV SOLN: 250.0000 mL | INTRAVENOUS | Status: DC | PRN

## 2012-01-18 MED ORDER — INSULIN ASPART PROT & ASPART (70-30 MIX) 100 UNIT/ML ~~LOC~~ SUSP: 20.0000 [IU] | Freq: Every day | SUBCUTANEOUS | Status: DC

## 2012-01-18 MED ORDER — ATORVASTATIN CALCIUM 40 MG PO TABS
40.0000 mg | ORAL_TABLET | Freq: Every day | ORAL | Status: DC
  Administered 2012-01-18 – 2012-01-19 (×2): 40 mg via ORAL
  Filled 2012-01-18 (×3): qty 1

## 2012-01-18 MED ORDER — HEPARIN SODIUM (PORCINE) 5000 UNIT/ML IJ SOLN
5000.0000 [IU] | Freq: Three times a day (TID) | INTRAMUSCULAR | Status: DC
  Administered 2012-01-18 – 2012-01-20 (×7): 5000 [IU] via SUBCUTANEOUS
  Filled 2012-01-18 (×12): qty 1

## 2012-01-18 MED ORDER — INSULIN ASPART 100 UNIT/ML ~~LOC~~ SOLN
0.0000 [IU] | Freq: Three times a day (TID) | SUBCUTANEOUS | Status: DC
  Administered 2012-01-18: 3 [IU] via SUBCUTANEOUS
  Administered 2012-01-18: 5 [IU] via SUBCUTANEOUS
  Administered 2012-01-19: 3 [IU] via SUBCUTANEOUS
  Administered 2012-01-19: 1 [IU] via SUBCUTANEOUS
  Administered 2012-01-19: 11 [IU] via SUBCUTANEOUS
  Administered 2012-01-20: 3 [IU] via SUBCUTANEOUS
  Administered 2012-01-20: 5 [IU] via SUBCUTANEOUS

## 2012-01-18 MED ORDER — LABETALOL HCL 300 MG PO TABS
300.0000 mg | ORAL_TABLET | Freq: Two times a day (BID) | ORAL | Status: DC
  Administered 2012-01-18 – 2012-01-20 (×5): 300 mg via ORAL
  Filled 2012-01-18 (×6): qty 1

## 2012-01-18 MED ORDER — INSULIN ASPART PROT & ASPART (70-30 MIX) 100 UNIT/ML ~~LOC~~ SUSP: 30.0000 [IU] | Freq: Every day | SUBCUTANEOUS | Status: DC

## 2012-01-18 MED ORDER — FUROSEMIDE 10 MG/ML IJ SOLN
120.0000 mg | Freq: Two times a day (BID) | INTRAVENOUS | Status: DC
  Filled 2012-01-18: qty 12

## 2012-01-18 MED ORDER — INSULIN ASPART PROT & ASPART (70-30 MIX) 100 UNIT/ML ~~LOC~~ SUSP
40.0000 [IU] | Freq: Every day | SUBCUTANEOUS | Status: DC
  Administered 2012-01-18: 40 [IU] via SUBCUTANEOUS
  Filled 2012-01-18: qty 3

## 2012-01-18 MED ORDER — SODIUM CHLORIDE 0.9 % IJ SOLN
3.0000 mL | Freq: Two times a day (BID) | INTRAMUSCULAR | Status: DC
  Administered 2012-01-18 – 2012-01-20 (×5): 3 mL via INTRAVENOUS

## 2012-01-18 MED ORDER — ACETAMINOPHEN 325 MG PO TABS
650.0000 mg | ORAL_TABLET | Freq: Four times a day (QID) | ORAL | Status: DC | PRN
  Administered 2012-01-18: 650 mg via ORAL
  Filled 2012-01-18: qty 2

## 2012-01-18 MED ORDER — AMLODIPINE BESYLATE 10 MG PO TABS
10.0000 mg | ORAL_TABLET | Freq: Every day | ORAL | Status: DC
  Administered 2012-01-19: 10 mg via ORAL
  Filled 2012-01-18 (×2): qty 1

## 2012-01-18 MED ORDER — FUROSEMIDE 10 MG/ML IJ SOLN
80.0000 mg | Freq: Two times a day (BID) | INTRAMUSCULAR | Status: DC
  Administered 2012-01-18: 80 mg via INTRAVENOUS
  Filled 2012-01-18 (×3): qty 8

## 2012-01-18 MED ORDER — INSULIN ASPART PROT & ASPART (70-30 MIX) 100 UNIT/ML ~~LOC~~ SUSP
20.0000 [IU] | Freq: Two times a day (BID) | SUBCUTANEOUS | Status: DC
  Administered 2012-01-18 – 2012-01-20 (×4): 20 [IU] via SUBCUTANEOUS
  Filled 2012-01-18: qty 3

## 2012-01-18 MED ORDER — ASPIRIN 81 MG PO CHEW
81.0000 mg | CHEWABLE_TABLET | Freq: Every day | ORAL | Status: DC
  Administered 2012-01-18 – 2012-01-20 (×3): 81 mg via ORAL
  Filled 2012-01-18 (×6): qty 1

## 2012-01-18 MED ORDER — FUROSEMIDE 80 MG PO TABS
160.0000 mg | ORAL_TABLET | Freq: Three times a day (TID) | ORAL | Status: DC
  Administered 2012-01-18 – 2012-01-20 (×7): 160 mg via ORAL
  Filled 2012-01-18 (×9): qty 2

## 2012-01-18 NOTE — Progress Notes (Signed)
Rec'd a call from RN re: low CBGs after pt rec'd home dose of 70/30 insulin today. She is eating.   Will decrease dose of 70/30 for both am/pm doses. Continue to follow CBGs.

## 2012-01-18 NOTE — Progress Notes (Signed)
Utilization Review Completed.   Little Bashore, RN, BSN Nurse Case Manager  [phone]  

## 2012-01-18 NOTE — Progress Notes (Signed)
  Echocardiogram 2D Echocardiogram has been performed.  Jasmine [name] FRANCES 01/18/2012, 4:41 PM

## 2012-01-18 NOTE — Progress Notes (Signed)
[facility]  Patient Status: New  [facility] is providing the following services: RN  If patient discharges after hours, please call [phone].   Wynelle Bourgeois 01/18/2012, 1:14 PM

## 2012-01-18 NOTE — Progress Notes (Addendum)
Subjective:   Jasmine [name] is a 41 y.o [place] American female with history of obesity, chronic renal failure (Cr 1.7), nonobstructive CAD per cath 2010, HTN, uncontrolled DM, depression with h/o suicidal ideation and diastolic dysfunction. She has been evaluated by Dr. Arrie Aran approximately 2 years ago for chronic renal insufficiency. Per pt she underwent a renal u/s that was ok and no further w/u was completed because it was felt to be 2/2 hypertension and diabetes.   She was recently admitted to [facility] for acute on chronic HF. An echo on November 03, 2010, EF 55-60%, normal wall motion, without regional wall motion abnormalities. Mild LVH. Probably pseudonormal filling pressures. Trivial mitral regurgitation with mildly dilated left atrium.V/Q scan on November 02, 2010, showing normal ventilation perfusion without evidence of PE. CT of the chest without contrast, showing suspected multifocal alveolar edema in the bilateral upper lobes and superior left lower lobe. Multifocal infection considered less likely. Cardiomegaly with bilateral pleural effusions. No findings to suggest interstitial lung disease.  She followed up in the HF clinic.   11/14 CPX Peak VO2: 10.9 % predicted peak VO2: 57.2% (corrects to 19.6 for ideal body weight) VE/VCO2 slope: 52 (to peak exercise) 43 (to RCP) OUES: 1.08 Peak RER: 1.19  Ventilatory Threshold: 7.2 % predicted peak VO2: 37.6% O2pulse: 12 % predicted O2pulse: 90%  Readmitted yesterday with recurrent HF symptoms in the setting of dietary indiscretion and NSAID use. Feeling a little better with diuresis. Weigh unchanged.     Intake/Output Summary (Last 24 hours) at 01/18/12 0857 Last data filed at 01/18/12 0551  Gross per 24 hour  Intake    240 ml  Output      4 ml  Net    236 ml    Current meds:    . amLODipine  10 mg Oral Daily  . aspirin  81 mg Oral Daily  . atorvastatin  40 mg Oral q1800  . ferrous sulfate  325 mg Oral BID  . furosemide  80 mg  Intravenous Q12H  . heparin  5,000 Units Subcutaneous Q8H  . insulin aspart  0-15 Units Subcutaneous TID WC  . insulin aspart protamine-insulin aspart  30 Units Subcutaneous Q supper  . insulin aspart protamine-insulin aspart  40 Units Subcutaneous Q breakfast  . labetalol  300 mg Oral BID  . ramipril  10 mg Oral QHS  . sodium chloride  3 mL Intravenous Q12H   Infusions:     Objective:  Blood pressure 140/70, pulse 94, temperature 99.2 F (37.3 C), temperature source Oral, resp. rate 18, height 5\' 6"  (1.676 m), weight 127.914 kg (282 lb), last menstrual period 01/16/2012, SpO2 91.00%. Weight change:   Physical Exam: General:  Lying in bed.  No resp difficulty HEENT: normal Neck: supple. JVP to jaw . Carotids 2+ bilat; no bruits. No lymphadenopathy or thryomegaly appreciated. Cor: PMI nonpalpable. Tachy regular. +s4 Lungs: clear Abdomen: soft, obese nontender, nondistended. Good bowel sounds. Extremities: no cyanosis, clubbing, rash, 2+ edema Neuro: alert & orientedx3, cranial nerves grossly intact. moves all 4 extremities w/o difficulty. Affect pleasant   Lab Results: Basic Metabolic Panel:  Lab 01/17/12 4098  NA 135  K 4.4  CL 107  CO2 17*  GLUCOSE 188*  BUN 61*  CREATININE 2.08*  CALCIUM 8.8  MG --  PHOS --   Liver Function Tests: No results found for this basename: AST:5,ALT:5,ALKPHOS:5,BILITOT:5,PROT:5,ALBUMIN:5 in the last 168 hours No results found for this basename: LIPASE:5,AMYLASE:5 in the last 168 hours No results  found for this basename: AMMONIA:5 in the last 168 hours CBC:  Lab 01/17/12 1910  WBC 11.1*  NEUTROABS --  HGB 7.9*  HCT 24.1*  MCV 84.0  PLT 315   Cardiac Enzymes:  Lab 01/18/12 0600 01/17/12 1910  CKTOTAL -- --  CKMB -- --  CKMBINDEX -- --  TROPONINI <0.30 <0.30   BNP: No components found with this basename: POCBNP:5 CBG:  Lab 01/18/12 0639  GLUCAP 212*   Microbiology: Lab Results  Component Value Date   CULT Multiple  bacterial morphotypes present, none predominant. Suggest appropriate recollection if clinically indicated. 10/17/2010   CULT  Value: MODERATE METHICILLIN RESISTANT STAPHYLOCOCCUS AUREUS Note: RIFAMPIN AND GENTAMICIN SHOULD NOT BE USED AS SINGLE DRUGS FOR TREATMENT OF STAPH INFECTIONS. This organism DOES NOT demonstrate inducible Clindamycin resistance in vitro. CRITICAL RESULT CALLED TO, READ BACK BY AND VERIFIED WITH: PAM G BY INGRAM A  08/08/09 1040AM 08/05/2009   CULT NO GROWTH 08/05/2009   CULT  Value: ESCHERICHIA COLI Note: Two isolates with different morphologies were identified as the same organism.The most resistant organism was reported. 06/03/2008   CULT ESCHERICHIA COLI 02/25/2007   No results found for this basename: CULT:2,SDES:2 in the last 168 hours  Imaging: Dg Chest Port 1 View  01/17/2012  *RADIOLOGY REPORT*  Clinical Data: Shortness of breath.  PORTABLE CHEST - 1 VIEW  Comparison: Chest radiograph performed 11/06/2010  Findings: The lungs are relatively well expanded.  Vascular congestion is seen, with increased interstitial markings, raising concern for mild interstitial edema.  Peribronchial thickening is noted.  A small left pleural effusion is likely present.  No pneumothorax is seen.  The cardiomediastinal silhouette is borderline normal in size.  No acute osseous abnormalities are identified.  IMPRESSION: Vascular congestion, with increased interstitial markings, raising concern for mild interstitial edema.  Likely small left pleural effusion; peribronchial thickening noted.   Original Report Authenticated By: Tonia Ghent, M.D.      ASSESSMENT:  1. A/c diastolic HF 2. Medication non-compliance 3. A/c renal failure 4. Anemia of chronic disease 5. OSA - noncompliant with CPAP  6. Morbid obesity 7. DM2  PLAN/DISCUSSION:  She has volume overload. Will increase IV lasix to 120 bid. Repeat echo. Follow daily BMET. Check HgBA1c.  Will ask Renal to see regarding need for EPO  and re-establishing care.   LOS: 1 day    Arvilla Meres, MD 01/18/2012, 8:57 AM

## 2012-01-18 NOTE — Care Management Note (Signed)
    Page 1 of 2   01/18/2012     11:29:35 AM   CARE MANAGEMENT NOTE 01/18/2012  Patient:  Jasmine [name], Jasmine [name]   Account Number:  [id number]  Date Initiated:  01/18/2012  Documentation initiated by:  Tera Mater  Subjective/Objective Assessment:   41yo femlae admitted with SOB.  Pt. lives in [place] with spouse.     Action/Plan:   In to complete HF Screen.  Pt. is interested in having an HH RN for HF management.  After looking at list of [facility] agenices, pt. chose [facility]   Anticipated DC Date:  01/20/2012   Anticipated DC Plan:  HOME W HOME HEALTH SERVICES      DC Planning Services  CM consult      [facility] Choice  HOME HEALTH   Choice offered to / List presented to:  C-1 Patient        HH arranged  HH-1 RN  HH-10 DISEASE MANAGEMENT      HH agency  [facility]   Status of service:  In process, will continue to follow Medicare Important Message given?   (If response is "NO", the following Medicare IM given date fields will be blank) Date Medicare IM given:   Date Additional Medicare IM given:    Discharge Disposition:    Per UR Regulation:  Reviewed for med. necessity/level of care/duration of stay  If discussed at Long Length of Stay Meetings, dates discussed:    Comments:  01/18/12 1105 TC to Eunice Blase, with [facility], to give referral for [facility] RN for HF management.  Anticipate dc in 1-2days. Pt. states she is able to obtain her medications without difficulty and does not need any DME. NCM to follow for dc needs. Tera Mater, RN, BSN NCM [phone]

## 2012-01-18 NOTE — Consult Note (Signed)
Reason for Consult:CKD 3 Referring Physician: Dr. Milas Kocher  Jasmine [name] is an 41 y.o. female.  HPI: 41 yr female with over 18 yr of DM,  HTN over 15 yr, obesity, ^ lipids,  Poor control of DM and HTN, and known DM Nx.  Has not seen Dr Abel Presto in over 3 yr.  Meds below.  Notes progressive edema over several weeks andabout 24 h of progressive dyspnea,prior to admit.  Was having to prop up to breathe.  BS out of control. Notes nocturia  X 1, no CP.   No N, V, D, cough fevers or chills.  Cr on admit 2.08. Constitutional: as above, notes wgt ^ 20 lb, not following diet,  Eyes: vision ok, saw eye Dr and had surgery Aug Ears, nose, mouth, throat, and face: negative Respiratory: as above,not smoking, no cough Cardiovascular: as above Gastrointestinal: no hx hep Genitourinary:no sx, but has hx UTIs Integument/breast: negative Hematologic/lymphatic: negative Musculoskeletal:aches all over Neurological: negative Behavioral/Psych: anxious Endocrine: bs out of control Allergic/Immunologic: rash with oxycodone   Past Medical History  Diagnosis Date  . Diastolic heart failure     echo 11/03/10 EF 55-60%  . Hypertension   . Chronic kidney disease (CKD)     stage II-III  . Hyperlipidemia   . Uncontrolled diabetes mellitus   . Diabetic ketoacidosis     with seizures  . Anemia   . Diabetic gastroparesis   . Obesity   . Coronary artery disease     mild per cath 2010    Past Surgical History  Procedure Date  . Cholecystectomy   . Carpal tunnel release 2003    Family History  Problem Relation Age of Onset  . Heart attack Mother   . Dementia Father     Social History:  reports that she has never smoked. She has never used smokeless tobacco. She reports that she drinks alcohol. She reports that she does not use illicit drugs.  Allergies:  Allergies  Allergen Reactions  . Contrast Media (Iodinated Diagnostic Agents)   . Oxycodone Itching  . Paroxetine     REACTION: GI Upset     Medications:  I have reviewed the patient's current medications. Prior to Admission:  Prescriptions prior to admission  Medication Sig Dispense Refill  . amLODipine (NORVASC) 10 MG tablet Take 10 mg by mouth daily.        Marland Kitchen aspirin 81 MG tablet Take 81 mg by mouth daily.        Marland Kitchen atorvastatin (LIPITOR) 40 MG tablet Take 40 mg by mouth Daily.      . ferrous sulfate 325 (65 FE) MG tablet Take 325 mg by mouth 2 (two) times daily.        Marland Kitchen FREESTYLE LITE test strip Twice daily before meals.      . furosemide (LASIX) 80 MG tablet Take 80 mg by mouth 2 (two) times daily.        . hydrochlorothiazide (HYDRODIURIL) 12.5 MG tablet Take 2 tablets (25 mg total) by mouth daily.  60 tablet  6  . labetalol (NORMODYNE) 300 MG tablet Take 300 mg by mouth 2 (two) times daily.        . Lancets (FREESTYLE) lancets       . NOVOFINE 30G X 8 MM MISC       . NOVOLOG MIX 70/30 FLEXPEN (70-30) 100 UNIT/ML injection Inject into the skin Twice daily before meals. 40 units in the morning and 30 units in the evening      .  ramipril (ALTACE) 10 MG tablet Take 10 mg by mouth at bedtime.          Results for orders placed during the hospital encounter of 01/17/12 (from the past 48 hour(s))  TROPONIN I     Status: Normal   Collection Time   01/17/12  7:10 PM      Component Value Range Comment   Troponin I <0.30  <0.30 ng/mL   BASIC METABOLIC PANEL     Status: Abnormal   Collection Time   01/17/12  7:10 PM      Component Value Range Comment   Sodium 135  135 - 145 mEq/[name]    Potassium 4.4  3.5 - 5.1 mEq/[name]    Chloride 107  96 - 112 mEq/[name]    CO2 17 (*) 19 - 32 mEq/[name]    Glucose, Bld 188 (*) 70 - 99 mg/dL    BUN 61 (*) 6 - 23 mg/dL    Creatinine, Ser 1.61 (*) 0.50 - 1.10 mg/dL    Calcium 8.8  8.4 - 09.6 mg/dL    GFR calc non Af Amer 28 (*) >90 mL/min    GFR calc Af Amer 33 (*) >90 mL/min   PRO B NATRIURETIC PEPTIDE     Status: Abnormal   Collection Time   01/17/12  7:10 PM      Component Value Range Comment    Pro B Natriuretic peptide (BNP) 1510.0 (*) 0 - 125 pg/mL   CBC     Status: Abnormal   Collection Time   01/17/12  7:10 PM      Component Value Range Comment   WBC 11.1 (*) 4.0 - 10.5 K/uL    RBC 2.87 (*) 3.87 - 5.11 MIL/uL    Hemoglobin 7.9 (*) 12.0 - 15.0 g/dL    HCT 04.5 (*) 40.9 - 46.0 %    MCV 84.0  78.0 - 100.0 fL    MCH 27.5  26.0 - 34.0 pg    MCHC 32.8  30.0 - 36.0 g/dL    RDW 81.1  91.4 - 78.2 %    Platelets 315  150 - 400 K/uL   MRSA PCR SCREENING     Status: Normal   Collection Time   01/18/12  1:27 AM      Component Value Range Comment   MRSA by PCR NEGATIVE  NEGATIVE   TROPONIN I     Status: Normal   Collection Time   01/18/12  6:00 AM      Component Value Range Comment   Troponin I <0.30  <0.30 ng/mL   GLUCOSE, CAPILLARY     Status: Abnormal   Collection Time   01/18/12  6:39 AM      Component Value Range Comment   Glucose-Capillary 212 (*) 70 - 99 mg/dL   RETICULOCYTES     Status: Abnormal   Collection Time   01/18/12 10:30 AM      Component Value Range Comment   Retic Ct Pct 2.1  0.4 - 3.1 %    RBC. 2.99 (*) 3.87 - 5.11 MIL/uL    Retic Count, Manual 62.8  19.0 - 186.0 K/uL     Dg Chest Port 1 View  01/17/2012  *RADIOLOGY REPORT*  Clinical Data: Shortness of breath.  PORTABLE CHEST - 1 VIEW  Comparison: Chest radiograph performed 11/06/2010  Findings: The lungs are relatively well expanded.  Vascular congestion is seen, with increased interstitial markings, raising concern for mild interstitial edema.  Peribronchial thickening is  noted.  A small left pleural effusion is likely present.  No pneumothorax is seen.  The cardiomediastinal silhouette is borderline normal in size.  No acute osseous abnormalities are identified.  IMPRESSION: Vascular congestion, with increased interstitial markings, raising concern for mild interstitial edema.  Likely small left pleural effusion; peribronchial thickening noted.   Original Report Authenticated By: Tonia Ghent, M.D.      @ROS @ Blood pressure 137/75, pulse 99, temperature 99.2 F (37.3 C), temperature source Oral, resp. rate 18, height 5\' 6"  (1.676 m), weight 127.914 kg (282 lb), last menstrual period 01/16/2012, SpO2 91.00%. @PHYSEXAMBYAGE2 @ Physical Examination: General appearance - alert, well appearing, and in no distress, oriented to person, place, and time and overweight Mental status - alert, oriented to person, place, and time, anxious Eyes - microaneurysms Mouth - mucous membranes moist, pharynx normal without lesions Neck - supple, adenopathy noted PCL, obese Lymphatics - posterior cervical nodes Chest - decreased air entry noted bilat Heart - normal rate, regular rhythm, normal S1, S2, no murmurs, rubs, clicks or gallops, S1 and S2 normal, S4 present, systolic murmur GR 2/6 at apex and holosys Abdomen - obese,pos bs, liver down 5 cm Neurological - alert, oriented, normal speech, no focal findings or movement disorder noted Musculoskeletal - no joint tenderness, deformity or swelling Extremities - pedal edema 2+ + Skin - dry  Assessment/Plan: 1 CKD 3 Vol xs, acidemic, anemic,suspect ^ PTH.  Needs higher doses diuretics po, needs close renal f/u and suspect CR will be about 3 when dry.  Needs bp <130/80, ACEI, lipid, bs, acid base, control.  She has to be motivated and needs to lose wgt.  Do not feel a large w/u indic and will set up with Dr. Salena Saner 2 HTN goal as above and agents.  Needs diuresis to control 3 DM needs close F/U 4. Anemia will check Fe and give epo 5. Metabolic Bone Disease: need to get PTH 6 Obesity makes renal dz worse P Fe/epo, po Lasix, check PTH, bs control , close f/u , consider bariatrics.  Jasmine [name] 01/18/2012, 11:18 AM

## 2012-01-18 NOTE — Progress Notes (Signed)
Pt's BS 66mg /dl and 71 mg/dl on recheck  After 0J given po..  Pt asymptomatic.  Ronny Flurry, PA informed

## 2012-01-19 DIAGNOSIS — E109 Type 1 diabetes mellitus without complications: Secondary | ICD-10-CM

## 2012-01-19 DIAGNOSIS — D638 Anemia in other chronic diseases classified elsewhere: Secondary | ICD-10-CM

## 2012-01-19 DIAGNOSIS — N390 Urinary tract infection, site not specified: Secondary | ICD-10-CM | POA: Diagnosis present

## 2012-01-19 LAB — CBC
Hemoglobin: 8.7 g/dL — ABNORMAL LOW (ref 12.0–15.0)
MCHC: 32.2 g/dL (ref 30.0–36.0)
RBC: 3.2 MIL/uL — ABNORMAL LOW (ref 3.87–5.11)
WBC: 9 10*3/uL (ref 4.0–10.5)

## 2012-01-19 LAB — COMPREHENSIVE METABOLIC PANEL
ALT: 8 U/L (ref 0–35)
Alkaline Phosphatase: 93 U/L (ref 39–117)
CO2: 19 mEq/L (ref 19–32)
Chloride: 105 mEq/L (ref 96–112)
GFR calc Af Amer: 29 mL/min — ABNORMAL LOW (ref >90)
Glucose, Bld: 153 mg/dL — ABNORMAL HIGH (ref 70–99)
Potassium: 4.1 mEq/L (ref 3.5–5.1)
Sodium: 141 mEq/L (ref 135–145)
Total Protein: 7.8 g/dL (ref 6.0–8.3)

## 2012-01-19 LAB — TROPONIN I
Troponin I: <0.3 ng/mL (ref <0.30)
Troponin I: <0.3 ng/mL (ref <0.30)
Troponin I: <0.3 ng/mL (ref <0.30)
Troponin I: <0.3 ng/mL (ref <0.30)

## 2012-01-19 LAB — GLUCOSE, CAPILLARY: Glucose-Capillary: 97 mg/dL (ref 70–99)

## 2012-01-19 MED ORDER — DARBEPOETIN ALFA-POLYSORBATE 150 MCG/0.3ML IJ SOLN
150.0000 ug | Freq: Once | INTRAMUSCULAR | Status: AC
  Administered 2012-01-19: 150 ug via SUBCUTANEOUS
  Filled 2012-01-19 (×2): qty 0.3

## 2012-01-19 MED ORDER — DEXTROSE 5 % IV SOLN
1.0000 g | INTRAVENOUS | Status: DC
  Administered 2012-01-19 – 2012-01-20 (×2): 1 g via INTRAVENOUS
  Filled 2012-01-19 (×3): qty 10

## 2012-01-19 MED ORDER — SODIUM CHLORIDE 0.9 % IV SOLN
1020.0000 mg | Freq: Once | INTRAVENOUS | Status: AC
  Administered 2012-01-19: 1020 mg via INTRAVENOUS
  Filled 2012-01-19 (×2): qty 34

## 2012-01-19 NOTE — Progress Notes (Signed)
Subjective: Interval History: has no complaint of not being SOB.  Objective: Vital signs in last 24 hours: Temp:  [97.7 F (36.5 C)-98.3 F (36.8 C)] 98.3 F (36.8 C) (12/25 0515) Pulse Rate:  [86-88] 87  (12/25 0515) Resp:  [18] 18  (12/25 0515) BP: (131-152)/(61-78) 131/61 mmHg (12/25 0515) SpO2:  [97 %-98 %] 97 % (12/25 0515) Weight:  [127.4 kg (280 lb 13.9 oz)] 127.4 kg (280 lb 13.9 oz) (12/25 0515) Weight change: -0.514 kg (-1 lb 2.1 oz)  Intake/Output from previous day: 01/24/2023 0701 - 12/25 0700 In: 920 [P.O.:920] Out: 1050 [Urine:1050] Intake/Output this shift: Total I/O In: 240 [P.O.:240] Out: -   General appearance: alert, cooperative and no distress Resp: diminished breath sounds bilaterally and rales bibasilar Cardio: S1, S2 normal and systolic murmur: holosystolic 2/6, blowing at apex GI: obese,pos bs,liver down 5 cm Extremities: edema 2+  Lab Results:  [facility] 01/19/12 0306 01/17/12 1910  WBC 9.0 11.1*  HGB 8.7* 7.9*  HCT 27.0* 24.1*  PLT 332 315   BMET:  Basename 01/19/12 0306 Jan 24, 2012 2144  NA 141 137  K 4.1 4.0  CL 105 103  CO2 19 18*  GLUCOSE 153* 112*  BUN 64* 63*  CREATININE 2.31* 2.08*  CALCIUM 9.6 9.9   No results found for this basename: PTH:2 in the last 72 hours Iron Studies:  Basename 24-Jan-2012 1523 2012/01/24 1030  IRON 17* --  TIBC 287 --  TRANSFERRIN -- --  FERRITIN -- 83    Studies/Results: Dg Chest Port 1 View  01/17/2012  *RADIOLOGY REPORT*  Clinical Data: Shortness of breath.  PORTABLE CHEST - 1 VIEW  Comparison: Chest radiograph performed 11/06/2010  Findings: The lungs are relatively well expanded.  Vascular congestion is seen, with increased interstitial markings, raising concern for mild interstitial edema.  Peribronchial thickening is noted.  A small left pleural effusion is likely present.  No pneumothorax is seen.  The cardiomediastinal silhouette is borderline normal in size.  No acute osseous abnormalities are  identified.  IMPRESSION: Vascular congestion, with increased interstitial markings, raising concern for mild interstitial edema.  Likely small left pleural effusion; peribronchial thickening noted.   Original Report Authenticated By: Tonia Ghent, M.D.     I have reviewed the patient's current medications.  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  Clinically stable and will need F/u.  Can send home from my standpoing 2 HTN lower as lower vol 3 Anemia fe low, bolus, and use epo 4 Obesity consider bariatrics 5 Dm fair control  P Lasix, epo, fe, ACEI    LOS: 2 days   Jasmine [name] L 01/19/2012,9:50 AM

## 2012-01-19 NOTE — Progress Notes (Signed)
Subjective:   [name] is a 41 y.o [place] American female with history of obesity, chronic renal failure (Cr 1.7), nonobstructive CAD per cath 2010, HTN, uncontrolled DM, depression with h/o suicidal ideation and diastolic dysfunction. She has been evaluated by Dr. Arrie Aran approximately 2 years ago for chronic renal insufficiency. Per pt she underwent a renal u/s that was ok and no further w/u was completed because it was felt to be 2/2 hypertension and diabetes.   She was recently admitted to [facility] for acute on chronic HF. An echo on November 03, 2010, EF 55-60%, normal wall motion, without regional wall motion abnormalities. Mild LVH. Probably pseudonormal filling pressures. Trivial mitral regurgitation with mildly dilated left atrium.V/Q scan on November 02, 2010, showing normal ventilation perfusion without evidence of PE. CT of the chest without contrast, showing suspected multifocal alveolar edema in the bilateral upper lobes and superior left lower lobe. Multifocal infection considered less likely. Cardiomegaly with bilateral pleural effusions. No findings to suggest interstitial lung disease.  She followed up in the HF clinic.   11/14 CPX Peak VO2: 10.9 % predicted peak VO2: 57.2% (corrects to 19.6 for ideal body weight) VE/VCO2 slope: 52 (to peak exercise) 43 (to RCP) OUES: 1.08 Peak RER: 1.19  Ventilatory Threshold: 7.2 % predicted peak VO2: 37.6% O2pulse: 12 % predicted O2pulse: 90%  Admitted 12/23 with recurrent HF in setting of noncompliance with diet and meds.  Seen by Renal who switched her diuretics to po and also started EPO. She is down 2 pounds. Starting to feel better. BP improved. Cr up from 2.08->2.31  Repeat echo yesterday which I viewed personally. EF 60% with high filling pressures. She    Intake/Output Summary (Last 24 hours) at 01/19/12 0857 Last data filed at 01/19/12 4098  Gross per 24 hour  Intake    920 ml  Output   1050 ml  Net   -130 ml    Current  meds:    . amLODipine  10 mg Oral QHS  . aspirin  81 mg Oral Daily  . atorvastatin  40 mg Oral q1800  . ferrous sulfate  325 mg Oral BID  . furosemide  160 mg Oral TID  . heparin  5,000 Units Subcutaneous Q8H  . insulin aspart  0-15 Units Subcutaneous TID WC  . insulin aspart protamine-insulin aspart  20 Units Subcutaneous BID WC  . labetalol  300 mg Oral BID  . ramipril  10 mg Oral QHS  . sodium bicarbonate  1,300 mg Oral BID  . sodium chloride  3 mL Intravenous Q12H   Infusions:     Objective:  Blood pressure 131/61, pulse 87, temperature 98.3 F (36.8 C), temperature source Oral, resp. rate 18, height 5\' 6"  (1.676 m), weight 127.4 kg (280 lb 13.9 oz), last menstrual period 01/16/2012, SpO2 97.00%. Weight change: -0.514 kg (-1 lb 2.1 oz)  Physical Exam: General:  Lying in bed.  No resp difficulty HEENT: normal Neck: supple. JVP to jaw . Carotids 2+ bilat; no bruits. No lymphadenopathy or thryomegaly appreciated. Cor: PMI nonpalpable. Tachy regular. +s4 Lungs: clear Abdomen: soft, obese nontender, nondistended. Good bowel sounds. Extremities: no cyanosis, clubbing, rash, 2+ edema Neuro: alert & orientedx3, cranial nerves grossly intact. moves all 4 extremities w/o difficulty. Affect pleasant   Lab Results: Basic Metabolic Panel:  Lab 01/19/12 1191 01/18/12 2144 01/18/12 1030 01/17/12 1910  NA 141 137 137 135  K 4.1 4.0 -- --  CL 105 103 106 107  CO2 19 18*  16* 17*  GLUCOSE 153* 112* 109* 188*  BUN 64* 63* 57* 61*  CREATININE 2.31* 2.08* 1.89* 2.08*  CALCIUM 9.6 9.9 9.2 8.8  MG -- -- -- --  PHOS 5.2* -- -- --   Liver Function Tests:  Lab 01/19/12 0306  AST 9  ALT 8  ALKPHOS 93  BILITOT 0.5  PROT 7.8  ALBUMIN 3.7   No results found for this basename: LIPASE:5,AMYLASE:5 in the last 168 hours No results found for this basename: AMMONIA:5 in the last 168 hours CBC:  Lab 01/19/12 0306 01/17/12 1910  WBC 9.0 11.1*  NEUTROABS -- --  HGB 8.7* 7.9*  HCT  27.0* 24.1*  MCV 84.4 84.0  PLT 332 315   Cardiac Enzymes:  Lab 01/19/12 0306 01/18/12 2144 01/18/12 1524 01/18/12 0600 01/17/12 1910  CKTOTAL -- -- -- -- --  CKMB -- -- -- -- --  CKMBINDEX -- -- -- -- --  TROPONINI <0.30 <0.30 <0.30 <0.30 <0.30   BNP: No components found with this basename: POCBNP:5 CBG:  Lab 01/19/12 0553 01/18/12 2124 01/18/12 1718 01/18/12 1205 01/18/12 1133  GLUCAP 159* 115* 167* 71 66*   Microbiology: Lab Results  Component Value Date   CULT Multiple bacterial morphotypes present, none predominant. Suggest appropriate recollection if clinically indicated. 10/17/2010   CULT  Value: MODERATE METHICILLIN RESISTANT STAPHYLOCOCCUS AUREUS Note: RIFAMPIN AND GENTAMICIN SHOULD NOT BE USED AS SINGLE DRUGS FOR TREATMENT OF STAPH INFECTIONS. This organism DOES NOT demonstrate inducible Clindamycin resistance in vitro. CRITICAL RESULT CALLED TO, READ BACK BY AND VERIFIED WITH: PAM G BY INGRAM A  08/08/09 1040AM 08/05/2009   CULT NO GROWTH 08/05/2009   CULT  Value: ESCHERICHIA COLI Note: Two isolates with different morphologies were identified as the same organism.The most resistant organism was reported. 06/03/2008   CULT ESCHERICHIA COLI 02/25/2007   No results found for this basename: CULT:2,SDES:2 in the last 168 hours  Imaging: Dg Chest Port 1 View  01/17/2012  *RADIOLOGY REPORT*  Clinical Data: Shortness of breath.  PORTABLE CHEST - 1 VIEW  Comparison: Chest radiograph performed 11/06/2010  Findings: The lungs are relatively well expanded.  Vascular congestion is seen, with increased interstitial markings, raising concern for mild interstitial edema.  Peribronchial thickening is noted.  A small left pleural effusion is likely present.  No pneumothorax is seen.  The cardiomediastinal silhouette is borderline normal in size.  No acute osseous abnormalities are identified.  IMPRESSION: Vascular congestion, with increased interstitial markings, raising concern for mild  interstitial edema.  Likely small left pleural effusion; peribronchial thickening noted.   Original Report Authenticated By: Tonia Ghent, M.D.      ASSESSMENT:  1. A/c diastolic HF 2. Medication non-compliance 3. A/c renal failure 4. Anemia of chronic disease 5. OSA - noncompliant with CPAP  6. Morbid obesity 7. DM2 8. Cardiorenal syndrome  PLAN/DISCUSSION:  She is diuresing slowly. However Cr starting to bump further. I worry she may have cardiorenal syndrome. Renal now managing diuretics. We will continue to follow. If Cr continues to bump may benefit from low dose dopamine to facilitate diuresis.   Appreciate [facility] assuming care. I will continue to follow.   LOS: 2 days    Arvilla Meres, MD 01/19/2012, 8:57 AM

## 2012-01-19 NOTE — Progress Notes (Signed)
[facility] PROGRESS NOTE  Jasmine [name] NFA:[id number] DOB: Alis 27, 1972 DOA: 01/17/2012 PCP: Tomma Lightning, MD  Assessment/Plan: #1 acute on chronic diastolic heart failure Clinical improvement. Patient's creatinine slowly increasing. Patient on diuretics per heart failure team and nephrology. Continue to monitor. Per nephrology team.  #2 chronic kidney disease stage IV Patient on Lasix and diureses. Per nephrology today suspect patient's creatinine baseline is likely in the threes. Per nephrology.  #3 probable UTI Urine cultures are pending. Will place empirically on IV Rocephin.  #4 hypertension Stable. Continue current regimen.  #5 anemia Likely secondary to chronic kidney disease. Patient is status post IV iron. Per nephrology. Follow H&H.  #6 diabetes mellitus Hemoglobin A1c is 9 on 01/18/2012. CBGs have ranged from 115-159. Patient with no further hypoglycemic episodes this is a decrease in her Lantus. Continue current regimen. Follow.  #7 prophylaxis Heparin for DVT prophylaxis.   Code Status: Full Family Communication: Updated patient at bedside. Disposition Plan: Home when medically stable   Consultants:  Cardiology/heart failure team: Dr. Gala Romney  Nephrology: Dr. Darrick Penna  Procedures: Chest x-ray 01/17/2012 2-D echo 01/18/2012  Antibiotics:  IV Rocephin 01/19/2012  HPI/Subjective: Patient states shortness of breath has improved. Patient denies any chest pain. No complaints.  Objective: Filed Vitals:   01/18/12 2048 01/19/12 0515 01/19/12 1034 01/19/12 1429  BP: 149/74 131/61 140/68 130/49  Pulse: 86 87 85 82  Temp: 98.3 F (36.8 C) 98.3 F (36.8 C)  98.4 F (36.9 C)  TempSrc: Oral Oral  Oral  Resp: 18 18  20   Height:      Weight:  127.4 kg (280 lb 13.9 oz)    SpO2: 97% 97%  99%    Intake/Output Summary (Last 24 hours) at 01/19/12 1607 Last data filed at 01/19/12 1352  Gross per 24 hour  Intake   1054 ml  Output   1050 ml  Net       4 ml   Filed Weights   01/17/12 2309 01/18/12 0439 01/19/12 0515  Weight: 127.914 kg (282 lb) 127.914 kg (282 lb) 127.4 kg (280 lb 13.9 oz)    Exam:   General:  NAD  Cardiovascular: RRR. 1-2 + ble EDEMA  Respiratory: CTAB  Abdomen: Soft, nontender, nondistended, positive bowel sounds.  Data Reviewed: Basic Metabolic Panel:  Lab 01/19/12 4696 01/18/12 2144 01/18/12 1030 01/17/12 1910  NA 141 137 137 135  K 4.1 4.0 4.1 4.4  CL 105 103 106 107  CO2 19 18* 16* 17*  GLUCOSE 153* 112* 109* 188*  BUN 64* 63* 57* 61*  CREATININE 2.31* 2.08* 1.89* 2.08*  CALCIUM 9.6 9.9 9.2 8.8  MG -- -- -- --  PHOS 5.2* -- -- --   Liver Function Tests:  Lab 01/19/12 0306  AST 9  ALT 8  ALKPHOS 93  BILITOT 0.5  PROT 7.8  ALBUMIN 3.7   No results found for this basename: LIPASE:5,AMYLASE:5 in the last 168 hours No results found for this basename: AMMONIA:5 in the last 168 hours CBC:  Lab 01/19/12 0306 01/17/12 1910  WBC 9.0 11.1*  NEUTROABS -- --  HGB 8.7* 7.9*  HCT 27.0* 24.1*  MCV 84.4 84.0  PLT 332 315   Cardiac Enzymes:  Lab 01/19/12 0850 01/19/12 0306 01/18/12 2144 01/18/12 1524 01/18/12 0600  CKTOTAL -- -- -- -- --  CKMB -- -- -- -- --  CKMBINDEX -- -- -- -- --  TROPONINI <0.30 <0.30 <0.30 <0.30 <0.30   BNP (last 3 results)  Basename 01/17/12  1910  PROBNP 1510.0*   CBG:  Lab 01/19/12 1128 01/19/12 0553 01/18/12 2124 01/18/12 1718 01/18/12 1205  GLUCAP 130* 159* 115* 167* 71    Recent Results (from the past 240 hour(s))  MRSA PCR SCREENING     Status: Normal   Collection Time   01/18/12  1:27 AM      Component Value Range Status Comment   MRSA by PCR NEGATIVE  NEGATIVE Final      Studies: Dg Chest Port 1 View  01/17/2012  *RADIOLOGY REPORT*  Clinical Data: Shortness of breath.  PORTABLE CHEST - 1 VIEW  Comparison: Chest radiograph performed 11/06/2010  Findings: The lungs are relatively well expanded.  Vascular congestion is seen, with increased  interstitial markings, raising concern for mild interstitial edema.  Peribronchial thickening is noted.  A small left pleural effusion is likely present.  No pneumothorax is seen.  The cardiomediastinal silhouette is borderline normal in size.  No acute osseous abnormalities are identified.  IMPRESSION: Vascular congestion, with increased interstitial markings, raising concern for mild interstitial edema.  Likely small left pleural effusion; peribronchial thickening noted.   Original Report Authenticated By: Tonia Ghent, M.D.     Scheduled Meds:   . amLODipine  10 mg Oral QHS  . aspirin  81 mg Oral Daily  . atorvastatin  40 mg Oral q1800  . cefTRIAXone (ROCEPHIN)  IV  1 g Intravenous Q24H  . ferrous sulfate  325 mg Oral BID  . furosemide  160 mg Oral TID  . heparin  5,000 Units Subcutaneous Q8H  . insulin aspart  0-15 Units Subcutaneous TID WC  . insulin aspart protamine-insulin aspart  20 Units Subcutaneous BID WC  . labetalol  300 mg Oral BID  . ramipril  10 mg Oral QHS  . sodium bicarbonate  1,300 mg Oral BID  . sodium chloride  3 mL Intravenous Q12H   Continuous Infusions:   Principal Problem:  *Acute on chronic diastolic heart failure Active Problems:  DIABETES MELLITUS, TYPE I  DEPRESSION  Hypertension  OSA (obstructive sleep apnea)  Acute on chronic renal failure  Anemia, chronic disease  UTI (lower urinary tract infection)    Time spent: .>35 mins    [facility]  [facility] Pager [phone]. If 8PM-8AM, please contact night-coverage at [email or web address], password [facility] 01/19/2012, 4:07 PM  LOS: 2 days

## 2012-01-20 ENCOUNTER — Encounter (HOSPITAL_COMMUNITY): Payer: Self-pay | Admitting: Internal Medicine

## 2012-01-20 DIAGNOSIS — N390 Urinary tract infection, site not specified: Secondary | ICD-10-CM

## 2012-01-20 DIAGNOSIS — N184 Chronic kidney disease, stage 4 (severe): Secondary | ICD-10-CM

## 2012-01-20 LAB — GLUCOSE, CAPILLARY: Glucose-Capillary: 205 mg/dL — ABNORMAL HIGH (ref 70–99)

## 2012-01-20 LAB — URINE CULTURE

## 2012-01-20 LAB — CBC
Platelets: 322 10*3/uL (ref 150–400)
RBC: 2.87 MIL/uL — ABNORMAL LOW (ref 3.87–5.11)
WBC: 9 10*3/uL (ref 4.0–10.5)

## 2012-01-20 LAB — RENAL FUNCTION PANEL
CO2: 21 mEq/L (ref 19–32)
GFR calc Af Amer: 30 mL/min — ABNORMAL LOW (ref >90)
Glucose, Bld: 210 mg/dL — ABNORMAL HIGH (ref 70–99)
Potassium: 4.2 mEq/L (ref 3.5–5.1)
Sodium: 138 mEq/L (ref 135–145)

## 2012-01-20 MED ORDER — LEVOFLOXACIN 250 MG PO TABS: 250.0000 mg | ORAL_TABLET | Freq: Every day | ORAL | Status: DC

## 2012-01-20 MED ORDER — INSULIN ASPART PROT & ASPART (70-30 MIX) 100 UNIT/ML ~~LOC~~ SUSP: 25.0000 [IU] | Freq: Two times a day (BID) | SUBCUTANEOUS | Status: DC

## 2012-01-20 MED ORDER — FUROSEMIDE 80 MG PO TABS: 160.0000 mg | ORAL_TABLET | Freq: Two times a day (BID) | ORAL | Status: DC

## 2012-01-20 MED ORDER — SODIUM BICARBONATE 650 MG PO TABS: 1300.0000 mg | ORAL_TABLET | Freq: Two times a day (BID) | ORAL | Status: DC

## 2012-01-20 NOTE — Plan of Care (Signed)
Problem: Food- and Nutrition-Related Knowledge Deficit (NB-1.1) Goal: Nutrition education Formal process to instruct or train a patient/client in a skill or to impart knowledge to help patients/clients voluntarily manage or modify food choices and eating behavior to maintain or improve health.  Outcome: Completed/Met Date Met:  01/20/12  RD consulted for nutrition education regarding diabetes.     Lab Results  Component Value Date    HGBA1C 9.0* 01/18/2012    RD provided "Carbohydrate Counting for People with Diabetes" handout from the Academy of Nutrition and Dietetics. Discussed different food groups and their effects on blood sugar, emphasizing carbohydrate-containing foods. Provided list of carbohydrates and recommended serving sizes of common foods.  Discussed importance of controlled and consistent carbohydrate intake throughout the day. Provided examples of ways to balance meals/snacks and encouraged intake of high-fiber, whole grain complex carbohydrates. Teach back method used.   RD provided "Low Sodium Nutrition Therapy" handout from the Academy of Nutrition and Dietetics. Reviewed patient's dietary recall. Provided examples on ways to decrease sodium intake in diet. Discouraged intake of processed foods and use of salt shaker. Encouraged fresh fruits and vegetables as well as whole grain sources of carbohydrates to maximize fiber intake.   RD discussed why it is important for patient to adhere to diet recommendations, and emphasized the role of fluids, foods to avoid, and importance of weighing self daily. Teach back method used.  Expect fair to good compliance.  Body mass index is 44.48 kg/(m^2). Pt meets criteria for morbid obesity  based on current BMI.  Current diet order is Carb Mod, patient is consuming approximately 100% of meals at this time. Labs and medications reviewed. No further nutrition interventions warranted at this time. RD contact information provided. If  additional nutrition issues arise, please re-consult RD.  Clarene Duke RD, LDN Pager [phone] After Hours pager [phone]

## 2012-01-20 NOTE — Progress Notes (Signed)
Pt discharged per w/c with all belongings. Accompanied by nurse tech to private vehicle -home per husband. Marisa Cyphers RN

## 2012-01-20 NOTE — Progress Notes (Signed)
Went over all discharge instructions with pt. Aware of upcoming appts and home meds. Pt saw dietician this am and has printed material about diabetes to follow at home with regards to meals and sick day management.

## 2012-01-20 NOTE — Progress Notes (Signed)
Subjective:   Volume status improving. Weight down several pounds on higher dose po lasix. Breathing better.  Intake/Output Summary (Last 24 hours) at 01/20/12 0924 Last data filed at 01/20/12 0818  Gross per 24 hour  Intake   1144 ml  Output   1950 ml  Net   -806 ml    Current meds:    . amLODipine  10 mg Oral QHS  . aspirin  81 mg Oral Daily  . atorvastatin  40 mg Oral q1800  . cefTRIAXone (ROCEPHIN)  IV  1 g Intravenous Q24H  . ferrous sulfate  325 mg Oral BID  . furosemide  160 mg Oral TID  . heparin  5,000 Units Subcutaneous Q8H  . insulin aspart  0-15 Units Subcutaneous TID WC  . insulin aspart protamine-insulin aspart  20 Units Subcutaneous BID WC  . labetalol  300 mg Oral BID  . ramipril  10 mg Oral QHS  . sodium bicarbonate  1,300 mg Oral BID  . sodium chloride  3 mL Intravenous Q12H   Infusions:     Objective:  Blood pressure 130/72, pulse 82, temperature 98.5 F (36.9 C), temperature source Oral, resp. rate 20, height 5\' 6"  (1.676 m), weight 125 kg (275 lb 9.2 oz), last menstrual period 01/16/2012, SpO2 95.00%. Weight change: -2.4 kg (-5 lb 4.7 oz)  Physical Exam: General:  Lying in bed.  No resp difficulty HEENT: normal Neck: supple. JVPup . Carotids 2+ bilat; no bruits. No lymphadenopathy or thryomegaly appreciated. Cor: PMI nonpalpable. Tachy regular. +s4 Lungs: clear Abdomen: soft, obese nontender, nondistended. Good bowel sounds. Extremities: no cyanosis, clubbing, rash, tr edema Neuro: alert & orientedx3, cranial nerves grossly intact. moves all 4 extremities w/o difficulty. Affect pleasant   Lab Results: Basic Metabolic Panel:  Lab 01/20/12 1914 01/19/12 0306 01/18/12 2144 01/18/12 1030 01/17/12 1910  NA 138 141 137 137 135  K 4.2 4.1 -- -- --  CL 101 105 103 106 107  CO2 21 19 18* 16* 17*  GLUCOSE 210* 153* 112* 109* 188*  BUN 66* 64* 63* 57* 61*  CREATININE 2.22* 2.31* 2.08* 1.89* 2.08*  CALCIUM 9.1 9.6 9.9 9.2 8.8  MG -- -- -- --  --  PHOS 5.2* 5.2* -- -- --   Liver Function Tests:  Lab 01/20/12 0500 01/19/12 0306  AST -- 9  ALT -- 8  ALKPHOS -- 93  BILITOT -- 0.5  PROT -- 7.8  ALBUMIN 3.4* 3.7   No results found for this basename: LIPASE:5,AMYLASE:5 in the last 168 hours No results found for this basename: AMMONIA:5 in the last 168 hours CBC:  Lab 01/20/12 0500 01/19/12 0306 01/17/12 1910  WBC 9.0 9.0 11.1*  NEUTROABS -- -- --  HGB 8.0* 8.7* 7.9*  HCT 24.0* 27.0* 24.1*  MCV 83.6 84.4 84.0  PLT 322 332 315   Cardiac Enzymes:  Lab 01/20/12 0250 01/19/12 2141 01/19/12 1545 01/19/12 0850 01/19/12 0306  CKTOTAL -- -- -- -- --  CKMB -- -- -- -- --  CKMBINDEX -- -- -- -- --  TROPONINI <0.30 <0.30 <0.30 <0.30 <0.30   BNP: No components found with this basename: POCBNP:5 CBG:  Lab 01/20/12 0557 01/19/12 2059 01/19/12 1620 01/19/12 1128 01/19/12 0553  GLUCAP 205* 97 316* 130* 159*   Microbiology: Lab Results  Component Value Date   CULT Multiple bacterial morphotypes present, none predominant. Suggest appropriate recollection if clinically indicated. 10/17/2010   CULT  Value: MODERATE METHICILLIN RESISTANT STAPHYLOCOCCUS AUREUS Note: RIFAMPIN AND GENTAMICIN  SHOULD NOT BE USED AS SINGLE DRUGS FOR TREATMENT OF STAPH INFECTIONS. This organism DOES NOT demonstrate inducible Clindamycin resistance in vitro. CRITICAL RESULT CALLED TO, READ BACK BY AND VERIFIED WITH: PAM G BY INGRAM A  08/08/09 1040AM 08/05/2009   CULT NO GROWTH 08/05/2009   CULT  Value: ESCHERICHIA COLI Note: Two isolates with different morphologies were identified as the same organism.The most resistant organism was reported. 06/03/2008   CULT ESCHERICHIA COLI 02/25/2007   No results found for this basename: CULT:2,SDES:2 in the last 168 hours  Imaging: No results found.   ASSESSMENT:  1. A/c diastolic HF 2. Medication non-compliance 3. A/c renal failure 4. Anemia of chronic disease 5. OSA - noncompliant with CPAP  6. Morbid  obesity 7. DM2 8. Cardiorenal syndrome  PLAN/DISCUSSION:  She is diuresing slowly. Cr improved.   I spoke with Dr. Darrick Penna who feels she can be d/c'd today on the higher dose of lasix (160 bid) with close f/u in Renal and HF clinic. I think this is reasonable. Reinforced need for daily weights and reviewed use of sliding scale diuretics.  If lasix not working could also consider switch to demadex 60 bid.   Need for compliance has been stressed.   We will arrange HF f/u.   LOS: 3 days    Arvilla Meres, MD 01/20/2012, 9:24 AM

## 2012-01-20 NOTE — Discharge Summary (Signed)
Physician Discharge Summary  Jasmine [name] Whichard ZOX:[id number] DOB: Jasmine [name] 14, 1972 DOA: 01/17/2012  PCP: Dois Davenport., MD  Admit date: 01/17/2012 Discharge date: 01/20/2012  Time spent: 65 minutes  Recommendations for Outpatient Follow-up:  1. Followup with PCP one week post discharge. 2. Patient is to followup in nephrology clinic for lab work 1 week post discharge. 3. Patient is to followup with Dr. Arrie Aran 2 weeks post discharge. 4. Patient is to followup with Dr. Gala Romney in the heart failure clinic 1 week post discharge.  Discharge Diagnoses:  Principal Problem:  *Acute on chronic diastolic heart failure Active Problems:  DIABETES MELLITUS, TYPE I  DEPRESSION  Hypertension  OSA (obstructive sleep apnea)  Anemia, chronic disease  UTI (lower urinary tract infection)  CKD (chronic kidney disease), stage IV   Discharge Condition: Stable and improved  Diet recommendation: Carb Modified diet  Filed Weights   01/18/12 0439 01/19/12 0515 01/20/12 0558  Weight: 127.914 kg (282 lb) 127.4 kg (280 lb 13.9 oz) 125 kg (275 lb 9.2 oz)    History of present illness:  Pt is a 41 yo woman with diastolic HF, CKD, HTN, DM, OSA who presents with 3 days of SOB. Pt admits that she has been eating salty foods lately. She has eaten ham on 2 occasions, the last being 3 days prior to admission. She has not been weighing herself because her scale broke. Her sx started off with a cough. The cough has continued and she now has CP on coughing. She has PND and orthopnea as well as increasing LE edema over the same 3 day time period. She was sleeping in a chair the day prior to admission. She denies fevers, chills, sick contacts. She had tendinitis in her [name] shoulder that flares up - she has been taking motrin daily for this pain. She also uses a heating pad for this at times as well. She received 80 mg IV lasix in the ED and has been urinating every hour since then and she is starting to feel a little  better. She has no other cardiac complaints. She was found to have slightly worsening Hgb from baseline - she denies any signs/sx of GI bleeding, though she reports she is menstruating right now   Hospital Course:  #1 acute on chronic diastolic heart failure  Patient was admitted with a three-day history of worsening shortness of breath felt to be secondary to dietary indiscretions. BNP today was noted to be elevated. Patient was admitted placed on telemetry floor . Cardiac enzymes were cycled which were negative x3. Patient was placed on a strict I.'s and Os, daily weights and placed on Lasix 80 mg IV twice a day. Patient was initially admitted to the cardiology service will follow. Was subsequently asked to take over care of the patient which we did on 01/19/2012. Patient improved clinically nephrology has been consulted for possible acute or chronic kidney disease and patient's diuretic doses were adjusted and switched from IV to oral per nephrology. 2-D echo which was obtained showed an EF of 60%, mild LVH, aortic valvular sclerosis without stenosis, mildly to moderately dilated left atrium. Patient will be discharged home in stable and improved condition .Clinical improvement. #2 chronic kidney disease stage IV  During the hospitalization and due to diuresis patient was noted to have a bump in her creatinine. There was some concern for acute on chronic kidney disease and subsequently a nephrology consultation was obtained. Patient was seen in consultation by Dr Deterding who felt patient has chronic  kidney disease stage IV it was felt patient needed higher doses of oral diuretics and close renal followup. It was felt that patient's creatinine will increase to about 3 when patient is dry. #3 probable UTI  During patient's hospitalization urinalysis which was done was concerning for UTI. Patient was placed empirically on IV  Rocephin. Patient will receive 2 doses of IV antibiotics during this  hospitalization. Patient be discharged on 1 more day of oral Levaquin to complete a three-day course of antibiotic therapy. Urine cultures are pending at time of discharge. #4 diabetes mellitus During the hospitalization hemoglobin A1c which was obtained came back at 9. Patient did have some episodes of hypoglycemia and a such her NovoLog 70/30 was decreased. Patient had better blood glucose control. Patient will be discharged on 70 3025 units twice daily. Patient to followup with PCP as outpatient. #5 anemia Patient was noted to be anemic during the hospitalization she was seen by nephrology was felt to be secondary to chronic kidney disease. Patient did not have any overt GI bleed. Patient was given a dose of IV iron. Patient will followup with nephrology as outpatient.  The rest of patient's chronic medical issues remained stable throughout the hospitalization the patient was discharged in stable and improved condition.     Procedures:  Chest x-ray 01/17/2012  2-D echo 01/18/2012  Consultations:  Cardiology: Dr. Gala Romney  Nephrology: Dr. Darrick Penna  Discharge Exam: Filed Vitals:   01/19/12 1429 01/19/12 2125 01/20/12 0558 01/20/12 0908  BP: 130/49 138/63 128/74 130/72  Pulse: 82 85 84 82  Temp: 98.4 F (36.9 C) 98.5 F (36.9 C) 98.5 F (36.9 C)   TempSrc: Oral Oral Oral   Resp: 20 19 20    Height:      Weight:   125 kg (275 lb 9.2 oz)   SpO2: 99% 99% 95%     General: NAD Cardiovascular: RRR.No LE edema. No JVD Respiratory: CTAB  Discharge Instructions  Discharge Orders    Future Orders Please Complete By Expires   Diet Carb Modified      Increase activity slowly      Discharge instructions      Comments:   Follow up with Jasmine [name]., MD in 1 week. Follow up with renal clinic for labs in 1 week, patient will be called with appointment time. Follow up with Dr Gala Romney in heart failure clinic in 1 week Follow up with Dr Geanie Berlin in 2 weeks.         Medication List     As of 01/20/2012 11:23 AM    STOP taking these medications         hydrochlorothiazide 12.5 MG tablet   Commonly known as: HYDRODIURIL      TAKE these medications         amLODipine 10 MG tablet   Commonly known as: NORVASC   Take 10 mg by mouth daily.      aspirin 81 MG tablet   Take 81 mg by mouth daily.      atorvastatin 40 MG tablet   Commonly known as: LIPITOR   Take 40 mg by mouth Daily.      ferrous sulfate 325 (65 FE) MG tablet   Take 325 mg by mouth 2 (two) times daily.      freestyle lancets      FREESTYLE LITE test strip   Generic drug: glucose blood   Twice daily before meals.      furosemide 80 MG tablet   Commonly  known as: LASIX   Take 2 tablets (160 mg total) by mouth 2 (two) times daily.      insulin aspart protamine-insulin aspart (70-30) 100 UNIT/ML injection   Commonly known as: NOVOLOG 70/30   Inject 25 Units into the skin 2 (two) times daily with a meal. 40 units in the morning and 30 units in the evening      labetalol 300 MG tablet   Commonly known as: NORMODYNE   Take 300 mg by mouth 2 (two) times daily.      levofloxacin 250 MG tablet   Commonly known as: LEVAQUIN   Take 1 tablet (250 mg total) by mouth daily. Take for 1 day then stop.   Start taking on: 01/21/2012      NOVOFINE 30G X 8 MM Misc   Generic drug: Insulin Pen Needle      ramipril 10 MG tablet   Commonly known as: ALTACE   Take 10 mg by mouth at bedtime.      sodium bicarbonate 650 MG tablet   Take 2 tablets (1,300 mg total) by mouth 2 (two) times daily.           Follow-up Information    Follow up with [facility] [name]., MD. Schedule an appointment as soon as possible for a visit in 1 week.   Contact information:   [address] [place] [address] [phone]       Follow up with Arvilla Meres, MD. Schedule an appointment as soon as possible for a visit in 1 week.   Contact information:   [address] [place] [address] [phone]       Follow up with COLADONATO,JOSEPH A, MD. Schedule an appointment as soon as possible for a visit in 2 weeks. (Patient will be called with appointment time.)    Contact information:   [address] [facility] [place] [address] [phone]       Please follow up. (Patient will be called for lab work in one week at the nephrology clinic.)           The results of significant diagnostics from this hospitalization (including imaging, microbiology, ancillary and laboratory) are listed below for reference.    Significant Diagnostic Studies: Dg Chest Port 1 View  01/17/2012  *RADIOLOGY REPORT*  Clinical Data: Shortness of breath.  PORTABLE CHEST - 1 VIEW  Comparison: Chest radiograph performed 11/06/2010  Findings: The lungs are relatively well expanded.  Vascular congestion is seen, with increased interstitial markings, raising concern for mild interstitial edema.  Peribronchial thickening is noted.  A small left pleural effusion is likely present.  No pneumothorax is seen.  The cardiomediastinal silhouette is borderline normal in size.  No acute osseous abnormalities are identified.  IMPRESSION: Vascular congestion, with increased interstitial markings, raising concern for mild interstitial edema.  Likely small left pleural effusion; peribronchial thickening noted.   Original Report Authenticated By: Tonia Ghent, M.D.     Microbiology: Recent Results (from the past 240 hour(s))  MRSA PCR SCREENING     Status: Normal   Collection Time   01/18/12  1:27 AM      Component Value Range Status Comment   MRSA by PCR NEGATIVE  NEGATIVE Final      Labs: Basic Metabolic Panel:  Lab 01/20/12 2440 01/19/12 0306 01/18/12 2144 01/18/12 1030 01/17/12 1910  NA 138 141 137 137 135  K 4.2 4.1 4.0 4.1 4.4  CL 101 105  103 106 107  CO2 21 19 18* 16* 17*  GLUCOSE 210* 153* 112* 109* 188*  BUN 66* 64* 63* 57* 61*   CREATININE 2.22* 2.31* 2.08* 1.89* 2.08*  CALCIUM 9.1 9.6 9.9 9.2 8.8  MG -- -- -- -- --  PHOS 5.2* 5.2* -- -- --   Liver Function Tests:  Lab 01/20/12 0500 01/19/12 0306  AST -- 9  ALT -- 8  ALKPHOS -- 93  BILITOT -- 0.5  PROT -- 7.8  ALBUMIN 3.4* 3.7   No results found for this basename: LIPASE:5,AMYLASE:5 in the last 168 hours No results found for this basename: AMMONIA:5 in the last 168 hours CBC:  Lab 01/20/12 0500 01/19/12 0306 01/17/12 1910  WBC 9.0 9.0 11.1*  NEUTROABS -- -- --  HGB 8.0* 8.7* 7.9*  HCT 24.0* 27.0* 24.1*  MCV 83.6 84.4 84.0  PLT 322 332 315   Cardiac Enzymes:  Lab 01/20/12 0250 01/19/12 2141 01/19/12 1545 01/19/12 0850 01/19/12 0306  CKTOTAL -- -- -- -- --  CKMB -- -- -- -- --  CKMBINDEX -- -- -- -- --  TROPONINI <0.30 <0.30 <0.30 <0.30 <0.30   BNP: BNP (last 3 results)  Basename 01/17/12 1910  PROBNP 1510.0*   CBG:  Lab 01/20/12 0557 01/19/12 2059 01/19/12 1620 01/19/12 1128 01/19/12 0553  GLUCAP 205* 97 316* 130* 159*       Signed:  Keleigh Kazee  [facility] 01/20/2012, 11:23 AM

## 2012-01-20 NOTE — Progress Notes (Signed)
Subjective: Interval History: has complaints putting out a lot of urine .  Objective: Vital signs in last 24 hours: Temp:  [98.4 F (36.9 C)-98.5 F (36.9 C)] 98.5 F (36.9 C) (12/26 0558) Pulse Rate:  [82-85] 82  (12/26 0908) Resp:  [19-20] 20  (12/26 0558) BP: (128-140)/(49-74) 130/72 mmHg (12/26 0908) SpO2:  [95 %-99 %] 95 % (12/26 0558) Weight:  [125 kg (275 lb 9.2 oz)] 125 kg (275 lb 9.2 oz) (12/26 0558) Weight change: -2.4 kg (-5 lb 4.7 oz)  Intake/Output from previous day: 12/25 0701 - 12/26 0700 In: 1144 [P.O.:960; IV Piggyback:184] Out: 1950 [Urine:1950] Intake/Output this shift: Total I/O In: 240 [P.O.:240] Out: -   General appearance: alert, cooperative and moderately obese Resp: diminished breath sounds bilaterally Cardio: regular rate and rhythm and systolic murmur: holosystolic 2/6, blowing at apex GI: obese, pos bs,liver down 5 cm Extremities: edema 2+  Lab Results:  Basename 01/20/12 0500 01/19/12 0306  WBC 9.0 9.0  HGB 8.0* 8.7*  HCT 24.0* 27.0*  PLT 322 332   BMET:  Basename 01/20/12 0500 01/19/12 0306  NA 138 141  K 4.2 4.1  CL 101 105  CO2 21 19  GLUCOSE 210* 153*  BUN 66* 64*  CREATININE 2.22* 2.31*  CALCIUM 9.1 9.6   No results found for this basename: PTH:2 in the last 72 hours Iron Studies:  Basename 2012/02/15 1523 02-15-2012 1030  IRON 17* --  TIBC 287 --  TRANSFERRIN -- --  FERRITIN -- 83    Studies/Results: No results found.  I have reviewed the patient's current medications.  Assessment/Plan: 1 CKD 4 vol improving, cont lasix 2 Anemia epo given, fe given, F/U out patient 3 HTN controlled ACEI 4 HPTH P P Labs one week, F/U office 2 wk scheduled.  Will s/o    LOS: 3 days   Derick Seminara L 01/20/2012,9:24 AM

## 2012-01-27 ENCOUNTER — Encounter (HOSPITAL_COMMUNITY): Payer: Self-pay

## 2012-01-27 ENCOUNTER — Ambulatory Visit (HOSPITAL_COMMUNITY)
Admit: 2012-01-27 | Discharge: 2012-01-27 | Disposition: A | Discharge status: Home / Self Care | Payer: 59 | Attending: Internal Medicine | Admitting: Internal Medicine

## 2012-01-27 VITALS — BP 144/72 | HR 86 | Wt 276.4 lb

## 2012-01-27 DIAGNOSIS — G4733 Obstructive sleep apnea (adult) (pediatric): Secondary | ICD-10-CM | POA: Insufficient documentation

## 2012-01-27 DIAGNOSIS — I5032 Chronic diastolic (congestive) heart failure: Secondary | ICD-10-CM | POA: Insufficient documentation

## 2012-01-27 LAB — BASIC METABOLIC PANEL
BUN: 70 mg/dL — ABNORMAL HIGH (ref 6–23)
Chloride: 100 mEq/L (ref 96–112)
GFR calc non Af Amer: 30 mL/min — ABNORMAL LOW (ref >90)
Glucose, Bld: 263 mg/dL — ABNORMAL HIGH (ref 70–99)
Potassium: 4.7 mEq/L (ref 3.5–5.1)

## 2012-01-27 MED ORDER — AMLODIPINE BESYLATE 10 MG PO TABS: 5.0000 mg | ORAL_TABLET | Freq: Every day | ORAL | Status: DC

## 2012-01-27 NOTE — Progress Notes (Signed)
PCP: Dr. Isabella Stalling  HPI: [name] is a 42 y.o African American female with history of nonobstructive CAD per cath 2010, HTN, uncontrolled DM and diastolic dysfunction. She has been evaluated by Dr. Trevor Iha approximately 2 years ago for chronic renal insufficiency. Per pt she underwent a renal u/s that was ok and no further w/u was completed because it was felt to be 2/2 hypertension and diabetes.   - Echo on November 03, 2010, EF 55-60%, normal wall motion, without regional wall motion abnormalities. Mild LVH. Probably pseudonormal filling pressures. Trivial mitral regurgitation with mildly dilated left atrium. Echo on October 19, 2010 showed grade 1 diastolic dysfunction.  - V/Q scan on November 02, 2010, showing normal ventilation perfusion without evidence of PE.  - CT of the chest without contrast 10/2010 showing suspected multifocal alveolar edema in the bilateral upper lobes and superior left lower lobe. Multifocal infection considered less likely. Cardiomegaly with bilateral pleural effusions. No findings to suggest interstitial lung disease.  - 11/14 CPX Peak VO2: 10.9 % predicted peak VO2: 57.2% (corrects to 19.6 for ideal body weight), VE/VCO2 slope: 52 (to peak exercise) 43 (to RCP),           OUES: 1.08, Peak RER: 1.19, Ventilatory Threshold: 7.2 % predicted peak VO2: 37.6%, O2pulse: 12 % predicted O2pulse: 90% - Sleep study completed 10/31. Dr Vassie Loll evaluated severe sleep apnea, placed on CPAP. - Echo 01/18/12: EF 60%.  High filling pressures.  LA mildly to mod dilated.  FV mildly dilated.  RA mildly dilated  Admitted 01/17/12-01/20/12 with A/C diastolic HF due to dietary noncompliance and not weighing daily.  Diuresed 7 pounds with lasix 80 mg IV BID.  Discharge weight 275 pounds.  Lasix increased 160 mg BID.  Will follow up with Dr. Eulogio Ditch for CKD, discharge Cr 2.22.    She returns for post hospital follow up today.  She feels well since discharge.  She has been trying to watch her  sodium intake.  She is weighing daily and running between  274-275 pounds.  Occ has dizziness with standing, no syncope.  Still has dyspnea with steps.  No orthopnea/PND.  Not using CPAP since Thanksgiving.  No edema.  No chest pain.  Has only been taking lasix 80/160mg  because of dizziness.   ROS: All systems negative except as listed in HPI, PMH and Problem List.  Past Medical History  Diagnosis Date  . Diastolic heart failure     echo 11/03/10 EF 55-60%  . Hypertension   . Chronic kidney disease (CKD)     stage II-III  . Hyperlipidemia   . Uncontrolled diabetes mellitus   . Diabetic ketoacidosis     with seizures  . Anemia   . Diabetic gastroparesis   . Obesity   . Coronary artery disease     mild per cath 2010  . CKD (chronic kidney disease), stage IV 01/20/2012    Current Outpatient Prescriptions  Medication Sig Dispense Refill  . amLODipine (NORVASC) 10 MG tablet Take 10 mg by mouth daily.        Marland Kitchen aspirin 81 MG tablet Take 81 mg by mouth daily.        Marland Kitchen atorvastatin (LIPITOR) 40 MG tablet Take 40 mg by mouth Daily.      . ferrous sulfate 325 (65 FE) MG tablet Take 325 mg by mouth 2 (two) times daily.        Marland Kitchen FREESTYLE LITE test strip Twice daily before meals.      Marland Kitchen  furosemide (LASIX) 80 MG tablet Take 2 tablets (160 mg total) by mouth 2 (two) times daily.  120 tablet  0  . insulin aspart protamine-insulin aspart (NOVOLOG MIX 70/30 FLEXPEN) (70-30) 100 UNIT/ML injection Inject 25 Units into the skin 2 (two) times daily with a meal.  15 mL  0  . labetalol (NORMODYNE) 300 MG tablet Take 300 mg by mouth 2 (two) times daily.        . Lancets (FREESTYLE) lancets       . NOVOFINE 30G X 8 MM MISC       . ramipril (ALTACE) 10 MG tablet Take 10 mg by mouth at bedtime.        . sodium bicarbonate 650 MG tablet Take 2 tablets (1,300 mg total) by mouth 2 (two) times daily.  120 tablet  0  . levofloxacin (LEVAQUIN) 250 MG tablet Take 1 tablet (250 mg total) by mouth daily. Take for 1  day then stop.  1 tablet  0     PHYSICAL EXAM: Filed Vitals:   01/27/12 1153  BP: 144/72  Pulse: 86  Weight: 276 lb 6.4 oz (125.374 kg)  SpO2: 100%    General:  Well appearing. No resp difficulty HEENT: normal Neck: supple. JVP 6-7 Carotids 2+ bilaterally; no bruits. No lymphadenopathy or thryomegaly appreciated. Cor: PMI normal. Regular rate & rhythm. No rubs, gallops or murmurs. Lungs: clear Abdomen: obese soft, nontender, nondistended. No hepatosplenomegaly. No bruits or masses. Good bowel sounds. Extremities: no cyanosis, clubbing, rash, trace edema  Neuro: alert & orientedx3, cranial nerves grossly intact. Moves all 4 extremities w/o difficulty. Affect pleasa   ASSESSMENT & PLAN:

## 2012-01-27 NOTE — Patient Instructions (Addendum)
Cut back amlodipine to 0.5 tab daily.  Continue lasix 160 mg twice daily.  Labs today.  Diet class on Tues the 21st at 2  Follow up 3 weeks.

## 2012-01-30 NOTE — Assessment & Plan Note (Addendum)
Volume status mildly elevated.  With dizziness will cut back amlodipine to 5 mg daily so we can continue lasix 160 mg BID.  Have discussed sliding scale lasix and daily weights.  She is interested in the HF diet class and will attend the next class on the 21st as she does eat out a lot and would like to discuss better options.  Labs today.

## 2012-01-30 NOTE — Assessment & Plan Note (Signed)
She has not been using her CPAP for over a month.  Have discussed importance of nightly use, she states she will start using CPAP again.

## 2012-02-08 ENCOUNTER — Encounter (HOSPITAL_COMMUNITY): Payer: 59

## 2012-02-15 ENCOUNTER — Encounter (HOSPITAL_COMMUNITY): Payer: 59

## 2012-02-23 ENCOUNTER — Encounter (HOSPITAL_COMMUNITY): Payer: Self-pay | Admitting: *Deleted

## 2012-02-23 ENCOUNTER — Ambulatory Visit (HOSPITAL_COMMUNITY)
Admission: RE | Admit: 2012-02-23 | Discharge: 2012-02-23 | Disposition: A | Discharge status: Home / Self Care | Payer: 59 | Source: Ambulatory Visit | Attending: Internal Medicine | Admitting: Internal Medicine

## 2012-02-23 VITALS — BP 158/78 | HR 89 | Wt 276.0 lb

## 2012-02-23 DIAGNOSIS — I5032 Chronic diastolic (congestive) heart failure: Secondary | ICD-10-CM

## 2012-02-23 DIAGNOSIS — G4733 Obstructive sleep apnea (adult) (pediatric): Secondary | ICD-10-CM

## 2012-02-23 DIAGNOSIS — I10 Essential (primary) hypertension: Secondary | ICD-10-CM

## 2012-02-23 MED ORDER — FUROSEMIDE 80 MG PO TABS: 160.0000 mg | ORAL_TABLET | Freq: Two times a day (BID) | ORAL | Status: DC

## 2012-02-23 MED ORDER — AMLODIPINE BESYLATE 10 MG PO TABS: 10.0000 mg | ORAL_TABLET | Freq: Every day | ORAL | Status: DC

## 2012-02-23 NOTE — Assessment & Plan Note (Signed)
Volume status mildly elevated as patient has been out of her medication for 2 days.  She is able to pick it up today.  She will restart lasix 160 mg BID today.  Have instructed her to call next time she runs out of refills instead of waiting for follow up appointment.  She voices understanding.  She missed the last diet class due to illness but she would like to attend the one on the 4th or 18th.

## 2012-02-23 NOTE — Progress Notes (Signed)
PCP: Dr. Isabella Stalling  HPI: [name] is a 42 y.o African American female with history of nonobstructive CAD per cath 2010, HTN, uncontrolled DM and diastolic dysfunction. She has been evaluated by Dr. Trevor Iha approximately 2 years ago for chronic renal insufficiency. Per pt she underwent a renal u/s that was ok and no further w/u was completed because it was felt to be 2/2 hypertension and diabetes.   - Echo on November 03, 2010, EF 55-60%, normal wall motion, without regional wall motion abnormalities. Mild LVH. Probably pseudonormal filling pressures. Trivial mitral regurgitation with mildly dilated left atrium. Echo on October 19, 2010 showed grade 1 diastolic dysfunction.  - V/Q scan on November 02, 2010, showing normal ventilation perfusion without evidence of PE.  - CT of the chest without contrast 10/2010 showing suspected multifocal alveolar edema in the bilateral upper lobes and superior left lower lobe. Multifocal infection considered less likely. Cardiomegaly with bilateral pleural effusions. No findings to suggest interstitial lung disease.  - 11/14 CPX Peak VO2: 10.9 % predicted peak VO2: 57.2% (corrects to 19.6 for ideal body weight), VE/VCO2 slope: 52 (to peak exercise) 43 (to RCP),           OUES: 1.08, Peak RER: 1.19, Ventilatory Threshold: 7.2 % predicted peak VO2: 37.6%, O2pulse: 12 % predicted O2pulse: 90% - Sleep study completed 10/31. Dr Vassie Loll evaluated severe sleep apnea, placed on CPAP. - Echo 01/18/12: EF 60%.  High filling pressures.  LA mildly to mod dilated.  FV mildly dilated.  RA mildly dilated  Admitted 01/17/12-01/20/12 with A/C diastolic HF due to dietary noncompliance and not weighing daily.  Diuresed 7 pounds with lasix 80 mg IV BID.  Discharge weight 275 pounds.  Lasix increased 160 mg BID.  Will follow up with Dr. Eulogio Ditch for CKD, discharge Cr 2.22.    She returns for follow up today. She has been out of her amlodipine and lasix for 2 days and has a HA today and  cough.  Weight has remained stable despite being out of lasix, 274-275 pounds.  She did have the flu and bronchitis for 2 weeks since last visit.  She has minimal edema.  No orthopnea/PND.     ROS: All systems negative except as listed in HPI, PMH and Problem List.  Past Medical History  Diagnosis Date  . Diastolic heart failure     echo 11/03/10 EF 55-60%  . Hypertension   . Chronic kidney disease (CKD)     stage II-III  . Hyperlipidemia   . Uncontrolled diabetes mellitus   . Diabetic ketoacidosis     with seizures  . Anemia   . Diabetic gastroparesis   . Obesity   . Coronary artery disease     mild per cath 2010  . CKD (chronic kidney disease), stage IV 01/20/2012    Current Outpatient Prescriptions  Medication Sig Dispense Refill  . amLODipine (NORVASC) 10 MG tablet Take 10 mg by mouth daily.      Marland Kitchen aspirin 81 MG tablet Take 81 mg by mouth daily.        Marland Kitchen atorvastatin (LIPITOR) 40 MG tablet Take 40 mg by mouth Daily.      . ferrous sulfate 325 (65 FE) MG tablet Take 325 mg by mouth 2 (two) times daily.        Marland Kitchen FREESTYLE LITE test strip Twice daily before meals.      . furosemide (LASIX) 80 MG tablet Take 2 tablets (160 mg total) by mouth 2 (two) times  daily.  120 tablet  0  . insulin aspart protamine-insulin aspart (NOVOLOG MIX 70/30 FLEXPEN) (70-30) 100 UNIT/ML injection Inject 25 Units into the skin 2 (two) times daily with a meal.  15 mL  0  . labetalol (NORMODYNE) 300 MG tablet Take 300 mg by mouth 2 (two) times daily.        . Lancets (FREESTYLE) lancets       . ramipril (ALTACE) 10 MG tablet Take 10 mg by mouth at bedtime.        . sodium bicarbonate 650 MG tablet Take 2 tablets (1,300 mg total) by mouth 2 (two) times daily.  120 tablet  0     PHYSICAL EXAM: Filed Vitals:   02/23/12 1525  BP: 158/78  Pulse: 89  Weight: 276 lb (125.193 kg)  SpO2: 100%    General:  Well appearing. No resp difficulty HEENT: normal Neck: supple. JVP 6-7 Carotids 2+ bilaterally;  no bruits. No lymphadenopathy or thryomegaly appreciated. Cor: PMI normal. Regular rate & rhythm. No rubs, gallops or murmurs. Lungs: clear Abdomen: obese soft, nontender, nondistended. No hepatosplenomegaly. No bruits or masses. Good bowel sounds. Extremities: no cyanosis, clubbing, rash, trace edema (L>R) Neuro: alert & orientedx3, cranial nerves grossly intact. Moves all 4 extremities w/o difficulty. Affect pleasa   ASSESSMENT & PLAN:

## 2012-02-23 NOTE — Assessment & Plan Note (Signed)
Needs to use CPAP 

## 2012-02-23 NOTE — Assessment & Plan Note (Signed)
Uncontrolled today as she has been out of norvasc and lasix.  She will pick up these medications today.  No titration at this time.

## 2012-02-23 NOTE — Patient Instructions (Addendum)
[facility] and [facility]th from 2-3   Pick up medications today.    Follow up 5-6 weeks.

## 2012-03-30 ENCOUNTER — Encounter (HOSPITAL_COMMUNITY): Payer: Self-pay | Admitting: Cardiology

## 2012-03-30 ENCOUNTER — Ambulatory Visit (HOSPITAL_COMMUNITY)
Admission: RE | Admit: 2012-03-30 | Discharge: 2012-03-30 | Disposition: A | Discharge status: Home / Self Care | Payer: 59 | Source: Ambulatory Visit | Attending: Internal Medicine | Admitting: Internal Medicine

## 2012-03-30 VITALS — BP 134/72 | HR 91 | Wt 278.1 lb

## 2012-03-30 DIAGNOSIS — I5032 Chronic diastolic (congestive) heart failure: Secondary | ICD-10-CM

## 2012-03-30 DIAGNOSIS — G4733 Obstructive sleep apnea (adult) (pediatric): Secondary | ICD-10-CM

## 2012-03-30 NOTE — Assessment & Plan Note (Signed)
Reinforced nightly CPAP.

## 2012-03-30 NOTE — Patient Instructions (Addendum)
If your weight is 275 pounds take an 80 mg of lasix  Do the following things EVERYDAY: 1) Weigh yourself in the morning before breakfast. Write it down and keep it in a log. 2) Take your medicines as prescribed 3) Eat low salt foods-Limit salt (sodium) to 2000 mg per day.  4) Stay as active as you can everyday 5) Limit all fluids for the day to less than 2 liters  Follow up in 3 months

## 2012-03-30 NOTE — Progress Notes (Signed)
Patient ID: Jasmine [name], female   DOB: [birth year], 42 y.o.   MRN: [id number] PCP: Dr. Christy Gentles Nephrology: Dr Abel Presto  HPI: [name] is a 42 y.o African American female with history of nonobstructive CAD per cath 2010, HTN, uncontrolled DM and diastolic dysfunction.   Followed by Dr Arrie Aran. Renal US evaluated no hydronephrosis.   - Echo on November 03, 2010, EF 55-60%, normal wall motion, without regional wall motion abnormalities. Mild LVH. Probably pseudonormal filling pressures. Trivial mitral regurgitation with mildly dilated left atrium. Echo on October 19, 2010 showed grade 1 diastolic dysfunction.  - V/Q scan on November 02, 2010, showing normal ventilation perfusion without evidence of PE.  - CT of the chest without contrast 10/2010 showing suspected multifocal alveolar edema in the bilateral upper lobes and superior left lower lobe. Multifocal infection considered less likely. Cardiomegaly with bilateral pleural effusions. No findings to suggest interstitial lung disease.  - 11/14 CPX Peak VO2: 10.9 % predicted peak VO2: 57.2% (corrects to 19.6 for ideal body weight), VE/VCO2 slope: 52 (to peak exercise) 43 (to RCP),           OUES: 1.08, Peak RER: 1.19, Ventilatory Threshold: 7.2 % predicted peak VO2: 37.6%, O2pulse: 12 % predicted O2pulse: 90% - Sleep study completed 10/31. Dr Jasmine [name] evaluated severe sleep apnea, placed on CPAP. - Echo 01/18/12: EF 60%.  High filling pressures.  LA mildly to mod dilated.  FV mildly dilated.  RA mildly dilated  Admitted 01/17/12-01/20/12 with A/C diastolic HF due to dietary noncompliance and not weighing daily.  Diuresed 7 pounds with lasix 80 mg IV BID.  Discharge weight 275 pounds.  Lasix increased 160 mg BID.  Discharge Cr 2.22.    She returns for follow up today. Denies SOB/PND/Orthopnea/CP. Does complain of dyspnea going up steps. Weight at home 265-272 pounds.She has not required any extra lasix. Complaint with medications. Eating high salt  foods such as pizza. She does not exercise. She uses CPAP 3-5 nights a week.   ROS: All systems negative except as listed in HPI, PMH and Problem List.  Past Medical History  Diagnosis Date  . Diastolic heart failure     echo 11/03/10 EF 55-60%  . Hypertension   . Chronic kidney disease (CKD)     stage II-III  . Hyperlipidemia   . Uncontrolled diabetes mellitus   . Diabetic ketoacidosis     with seizures  . Anemia   . Diabetic gastroparesis   . Obesity   . Coronary artery disease     mild per cath 2010  . CKD (chronic kidney disease), stage IV 01/20/2012    Current Outpatient Prescriptions  Medication Sig Dispense Refill  . amLODipine (NORVASC) 10 MG tablet Take 1 tablet (10 mg total) by mouth daily.  30 tablet  6  . aspirin 81 MG tablet Take 81 mg by mouth daily.        Marland Kitchen atorvastatin (LIPITOR) 40 MG tablet Take 40 mg by mouth Daily.      . ferrous sulfate 325 (65 FE) MG tablet Take 325 mg by mouth 2 (two) times daily.        Marland Kitchen FREESTYLE LITE test strip Twice daily before meals.      . furosemide (LASIX) 80 MG tablet Take 2 tablets (160 mg total) by mouth 2 (two) times daily.  120 tablet  6  . insulin aspart protamine-insulin aspart (NOVOLOG MIX 70/30 FLEXPEN) (70-30) 100 UNIT/ML injection Inject 25 Units into the skin 2 (two) times  daily with a meal.  15 mL  0  . labetalol (NORMODYNE) 300 MG tablet Take 300 mg by mouth 2 (two) times daily.        . Lancets (FREESTYLE) lancets       . ramipril (ALTACE) 10 MG tablet Take 10 mg by mouth at bedtime.        . sodium bicarbonate 650 MG tablet Take 2 tablets (1,300 mg total) by mouth 2 (two) times daily.  120 tablet  0   No current facility-administered medications for this encounter.     PHYSICAL EXAM: Filed Vitals:   03/30/12 1516  BP: 134/72  Pulse: 91  Weight: 278 lb 1.6 oz (126.145 kg)  SpO2: 100%    General:  Well appearing. No resp difficulty HEENT: normal Neck: supple. JVP 6-7 Carotids 2+ bilaterally; no bruits.  No lymphadenopathy or thryomegaly appreciated. Cor: PMI normal. Regular rate & rhythm. No rubs, gallops or murmurs. Lungs: clear Abdomen: obese soft, nontender, nondistended. No hepatosplenomegaly. No bruits or masses. Good bowel sounds. Extremities: no cyanosis, clubbing, rash, no edema (L>R) Neuro: alert & orientedx3, cranial nerves grossly intact. Moves all 4 extremities w/o difficulty. Affect pleasa   ASSESSMENT & PLAN:

## 2012-03-30 NOTE — Assessment & Plan Note (Addendum)
Volume status stable. Continue current diuretic regimen. She is instructed to take an additional 80 mg of lasix if her weight is 275 pounds or greater. Follow up in 3 months to reassess volume status.

## 2012-07-24 ENCOUNTER — Encounter (HOSPITAL_COMMUNITY): Payer: Self-pay

## 2012-07-24 ENCOUNTER — Ambulatory Visit (HOSPITAL_COMMUNITY)
Admission: RE | Admit: 2012-07-24 | Discharge: 2012-07-24 | Disposition: A | Discharge status: Home / Self Care | Payer: 59 | Source: Ambulatory Visit | Attending: Internal Medicine | Admitting: Internal Medicine

## 2012-07-24 VITALS — BP 120/60 | HR 88 | Wt 273.0 lb

## 2012-07-24 DIAGNOSIS — Z79899 Other long term (current) drug therapy: Secondary | ICD-10-CM | POA: Insufficient documentation

## 2012-07-24 DIAGNOSIS — Z794 Long term (current) use of insulin: Secondary | ICD-10-CM | POA: Insufficient documentation

## 2012-07-24 DIAGNOSIS — E669 Obesity, unspecified: Secondary | ICD-10-CM | POA: Insufficient documentation

## 2012-07-24 DIAGNOSIS — Z7982 Long term (current) use of aspirin: Secondary | ICD-10-CM | POA: Insufficient documentation

## 2012-07-24 DIAGNOSIS — I5032 Chronic diastolic (congestive) heart failure: Secondary | ICD-10-CM | POA: Insufficient documentation

## 2012-07-24 DIAGNOSIS — E1149 Type 2 diabetes mellitus with other diabetic neurological complication: Secondary | ICD-10-CM | POA: Insufficient documentation

## 2012-07-24 DIAGNOSIS — E785 Hyperlipidemia, unspecified: Secondary | ICD-10-CM | POA: Insufficient documentation

## 2012-07-24 DIAGNOSIS — I2510 Atherosclerotic heart disease of native coronary artery without angina pectoris: Secondary | ICD-10-CM | POA: Insufficient documentation

## 2012-07-24 DIAGNOSIS — I129 Hypertensive chronic kidney disease with stage 1 through stage 4 chronic kidney disease, or unspecified chronic kidney disease: Secondary | ICD-10-CM | POA: Insufficient documentation

## 2012-07-24 DIAGNOSIS — G4733 Obstructive sleep apnea (adult) (pediatric): Secondary | ICD-10-CM

## 2012-07-24 DIAGNOSIS — N183 Chronic kidney disease, stage 3 unspecified: Secondary | ICD-10-CM | POA: Insufficient documentation

## 2012-07-24 DIAGNOSIS — K3184 Gastroparesis: Secondary | ICD-10-CM | POA: Insufficient documentation

## 2012-07-24 NOTE — Progress Notes (Signed)
Patient ID: Jasmine [name], female   DOB: 1971-01-08, 42 y.o.   MRN: [id number] PCP: Dr. Christy Gentles Nephrology: Dr Abel Presto  HPI: Jasmine [name] is a 42 y.o African American female with history of nonobstructive CAD per cath 2010, HTN, uncontrolled DM and diastolic dysfunction.   Followed by Dr Arrie Aran. Renal US evaluated no hydronephrosis.   - Echo on November 03, 2010, EF 55-60%, normal wall motion, without regional wall motion abnormalities. Mild LVH. Probably pseudonormal filling pressures. Trivial mitral regurgitation with mildly dilated left atrium. Echo on October 19, 2010 showed grade 1 diastolic dysfunction.  - V/Q scan on November 02, 2010, showing normal ventilation perfusion without evidence of PE.  - CT of the chest without contrast 10/2010 showing suspected multifocal alveolar edema in the bilateral upper lobes and superior left lower lobe. Multifocal infection considered less likely. Cardiomegaly with bilateral pleural effusions. No findings to suggest interstitial lung disease.  - 11/14 CPX Peak VO2: 10.9 % predicted peak VO2: 57.2% (corrects to 19.6 for ideal body weight), VE/VCO2 slope: 52 (to peak exercise) 43 (to RCP),           OUES: 1.08, Peak RER: 1.19, Ventilatory Threshold: 7.2 % predicted peak VO2: 37.6%, O2pulse: 12 % predicted O2pulse: 90% - Sleep study completed 10/31. Dr Vassie Loll evaluated severe sleep apnea, placed on CPAP. - Echo 01/18/12: EF 60%.  High filling pressures.  LA mildly to mod dilated.  FV mildly dilated.  RA mildly dilated  Admitted 01/17/12-01/20/12 with A/C diastolic HF due to dietary noncompliance and not weighing daily.  Diuresed 7 pounds with lasix 80 mg IV BID.  Discharge weight 275 pounds.  Lasix increased 160 mg BID.  Discharge Cr 2.22.    She returns for follow up today. Last office visit she was instructed to take an additional 80 mg of lasix if her weight is 275 pounds or greater. She has not required additional lasix. Weight at home 267-268 pounds.   Denies SOB/PND/Orthopnea. Exercising 2 times a week.  She uses CPAP 3-4 nights a week.   ROS: All systems negative except as listed in HPI, PMH and Problem List.  Past Medical History  Diagnosis Date  . Diastolic heart failure     echo 11/03/10 EF 55-60%  . Hypertension   . Chronic kidney disease (CKD)     stage II-III  . Hyperlipidemia   . Uncontrolled diabetes mellitus   . Diabetic ketoacidosis     with seizures  . Anemia   . Diabetic gastroparesis   . Obesity   . Coronary artery disease     mild per cath 2010  . CKD (chronic kidney disease), stage IV 01/20/2012    Current Outpatient Prescriptions  Medication Sig Dispense Refill  . amLODipine (NORVASC) 10 MG tablet Take 1 tablet (10 mg total) by mouth daily.  30 tablet  6  . aspirin 81 MG tablet Take 81 mg by mouth daily.        Marland Kitchen atorvastatin (LIPITOR) 40 MG tablet Take 40 mg by mouth Daily.      . ferrous sulfate 325 (65 FE) MG tablet Take 325 mg by mouth 2 (two) times daily.        Marland Kitchen FREESTYLE LITE test strip Twice daily before meals.      . furosemide (LASIX) 80 MG tablet Take 2 tablets (160 mg total) by mouth 2 (two) times daily.  120 tablet  6  . insulin aspart protamine-insulin aspart (NOVOLOG MIX 70/30 FLEXPEN) (70-30) 100 UNIT/ML injection Inject  25 Units into the skin 2 (two) times daily with a meal.  15 mL  0  . labetalol (NORMODYNE) 300 MG tablet Take 300 mg by mouth 2 (two) times daily.        . Lancets (FREESTYLE) lancets       . ramipril (ALTACE) 10 MG tablet Take 10 mg by mouth at bedtime.        . sodium bicarbonate 650 MG tablet Take 2 tablets (1,300 mg total) by mouth 2 (two) times daily.  120 tablet  0   No current facility-administered medications for this encounter.     PHYSICAL EXAM: Filed Vitals:   07/24/12 0936  BP: 120/60  Pulse: 88  Weight: 273 lb (123.832 kg)  SpO2: 100%    General:  Well appearing. No resp difficulty HEENT: normal Neck: supple. JVP 5-6 Carotids 2+ bilaterally; no  bruits. No lymphadenopathy or thryomegaly appreciated. Cor: PMI normal. Regular rate & rhythm. No rubs, gallops or murmurs. Lungs: clear Abdomen: obese, soft, nontender, nondistended. No hepatosplenomegaly. No bruits or masses. Good bowel sounds. Extremities: no cyanosis, clubbing, rash, no edema Neuro: alert & orientedx3, cranial nerves grossly intact. Moves all 4 extremities w/o difficulty. Affect pleasa   ASSESSMENT & PLAN:

## 2012-07-24 NOTE — Patient Instructions (Addendum)
Follow up in 6 months  Do the following things EVERYDAY: 1) Weigh yourself in the morning before breakfast. Write it down and keep it in a log. 2) Take your medicines as prescribed 3) Eat low salt foods-Limit salt (sodium) to 2000 mg per day.  4) Stay as active as you can everyday 5) Limit all fluids for the day to less than 2 liters 

## 2012-07-24 NOTE — Assessment & Plan Note (Addendum)
Volume status stable. Continue current diuretic regimen. Discussed sliding scale diuretic regimen. Congratulated on weight loss. Reinforced daily weights, low salt food choices, daily exercise, and limiting fluid intake to < 2 liters per day. Follow up in 6 months.

## 2012-07-24 NOTE — Assessment & Plan Note (Signed)
Encouraged to use CPAP nightly.

## 2012-08-15 ENCOUNTER — Ambulatory Visit: Payer: 59 | Admitting: *Deleted

## 2012-08-24 ENCOUNTER — Ambulatory Visit: Payer: 59 | Admitting: *Deleted

## 2012-09-28 ENCOUNTER — Other Ambulatory Visit: Payer: Self-pay

## 2012-09-28 MED ORDER — FUROSEMIDE 80 MG PO TABS: 160.0000 mg | ORAL_TABLET | Freq: Two times a day (BID) | ORAL | Status: DC

## 2012-10-07 ENCOUNTER — Inpatient Hospital Stay (HOSPITAL_COMMUNITY)
Admission: EM | Admit: 2012-10-07 | Discharge: 2012-10-22 | DRG: 291 | Disposition: A | Discharge status: Home / Self Care | Payer: 59 | Attending: Internal Medicine | Admitting: Internal Medicine

## 2012-10-07 ENCOUNTER — Emergency Department (HOSPITAL_COMMUNITY): Payer: 59

## 2012-10-07 DIAGNOSIS — J309 Allergic rhinitis, unspecified: Secondary | ICD-10-CM

## 2012-10-07 DIAGNOSIS — E785 Hyperlipidemia, unspecified: Secondary | ICD-10-CM | POA: Diagnosis present

## 2012-10-07 DIAGNOSIS — J9601 Acute respiratory failure with hypoxia: Secondary | ICD-10-CM | POA: Diagnosis present

## 2012-10-07 DIAGNOSIS — Z9119 Patient's noncompliance with other medical treatment and regimen: Secondary | ICD-10-CM

## 2012-10-07 DIAGNOSIS — E11319 Type 2 diabetes mellitus with unspecified diabetic retinopathy without macular edema: Secondary | ICD-10-CM | POA: Diagnosis present

## 2012-10-07 DIAGNOSIS — F329 Major depressive disorder, single episode, unspecified: Secondary | ICD-10-CM

## 2012-10-07 DIAGNOSIS — R079 Chest pain, unspecified: Secondary | ICD-10-CM

## 2012-10-07 DIAGNOSIS — N390 Urinary tract infection, site not specified: Secondary | ICD-10-CM

## 2012-10-07 DIAGNOSIS — E870 Hyperosmolality and hypernatremia: Secondary | ICD-10-CM | POA: Diagnosis present

## 2012-10-07 DIAGNOSIS — E669 Obesity, unspecified: Secondary | ICD-10-CM | POA: Diagnosis present

## 2012-10-07 DIAGNOSIS — J9600 Acute respiratory failure, unspecified whether with hypoxia or hypercapnia: Secondary | ICD-10-CM | POA: Diagnosis present

## 2012-10-07 DIAGNOSIS — N184 Chronic kidney disease, stage 4 (severe): Secondary | ICD-10-CM | POA: Diagnosis present

## 2012-10-07 DIAGNOSIS — J154 Pneumonia due to other streptococci: Secondary | ICD-10-CM | POA: Diagnosis present

## 2012-10-07 DIAGNOSIS — M79604 Pain in right leg: Secondary | ICD-10-CM

## 2012-10-07 DIAGNOSIS — E876 Hypokalemia: Secondary | ICD-10-CM | POA: Diagnosis present

## 2012-10-07 DIAGNOSIS — I10 Essential (primary) hypertension: Secondary | ICD-10-CM

## 2012-10-07 DIAGNOSIS — E109 Type 1 diabetes mellitus without complications: Secondary | ICD-10-CM

## 2012-10-07 DIAGNOSIS — Z794 Long term (current) use of insulin: Secondary | ICD-10-CM

## 2012-10-07 DIAGNOSIS — R5381 Other malaise: Secondary | ICD-10-CM | POA: Diagnosis present

## 2012-10-07 DIAGNOSIS — N179 Acute kidney failure, unspecified: Secondary | ICD-10-CM | POA: Diagnosis present

## 2012-10-07 DIAGNOSIS — G4733 Obstructive sleep apnea (adult) (pediatric): Secondary | ICD-10-CM | POA: Diagnosis present

## 2012-10-07 DIAGNOSIS — J189 Pneumonia, unspecified organism: Secondary | ICD-10-CM

## 2012-10-07 DIAGNOSIS — E1149 Type 2 diabetes mellitus with other diabetic neurological complication: Secondary | ICD-10-CM | POA: Diagnosis present

## 2012-10-07 DIAGNOSIS — E1139 Type 2 diabetes mellitus with other diabetic ophthalmic complication: Secondary | ICD-10-CM

## 2012-10-07 DIAGNOSIS — Z91199 Patient's noncompliance with other medical treatment and regimen due to unspecified reason: Secondary | ICD-10-CM

## 2012-10-07 DIAGNOSIS — R109 Unspecified abdominal pain: Secondary | ICD-10-CM

## 2012-10-07 DIAGNOSIS — Z6841 Body Mass Index (BMI) 40.0 and over, adult: Secondary | ICD-10-CM

## 2012-10-07 DIAGNOSIS — D638 Anemia in other chronic diseases classified elsewhere: Secondary | ICD-10-CM

## 2012-10-07 DIAGNOSIS — I129 Hypertensive chronic kidney disease with stage 1 through stage 4 chronic kidney disease, or unspecified chronic kidney disease: Secondary | ICD-10-CM | POA: Diagnosis present

## 2012-10-07 DIAGNOSIS — K3184 Gastroparesis: Secondary | ICD-10-CM | POA: Diagnosis present

## 2012-10-07 DIAGNOSIS — I2510 Atherosclerotic heart disease of native coronary artery without angina pectoris: Secondary | ICD-10-CM | POA: Diagnosis present

## 2012-10-07 DIAGNOSIS — E13319 Other specified diabetes mellitus with unspecified diabetic retinopathy without macular edema: Secondary | ICD-10-CM | POA: Diagnosis present

## 2012-10-07 DIAGNOSIS — I5033 Acute on chronic diastolic (congestive) heart failure: Principal | ICD-10-CM | POA: Diagnosis present

## 2012-10-07 DIAGNOSIS — I5032 Chronic diastolic (congestive) heart failure: Secondary | ICD-10-CM

## 2012-10-07 LAB — CBC WITH DIFFERENTIAL/PLATELET
Basophils Relative: 0 % (ref 0–1)
Eosinophils Absolute: 0.3 10*3/uL (ref 0.0–0.7)
HCT: 25.1 % — ABNORMAL LOW (ref 36.0–46.0)
Hemoglobin: 8.3 g/dL — ABNORMAL LOW (ref 12.0–15.0)
MCH: 28.1 pg (ref 26.0–34.0)
MCHC: 33.1 g/dL (ref 30.0–36.0)
MCV: 85.1 fL (ref 78.0–100.0)
Monocytes Absolute: 0.9 10*3/uL (ref 0.1–1.0)
Monocytes Relative: 7 % (ref 3–12)

## 2012-10-07 LAB — POCT I-STAT TROPONIN I

## 2012-10-07 LAB — BASIC METABOLIC PANEL
BUN: 58 mg/dL — ABNORMAL HIGH (ref 6–23)
Creatinine, Ser: 1.92 mg/dL — ABNORMAL HIGH (ref 0.50–1.10)
GFR calc Af Amer: 36 mL/min — ABNORMAL LOW (ref >90)
GFR calc non Af Amer: 31 mL/min — ABNORMAL LOW (ref >90)

## 2012-10-07 MED ORDER — RAMIPRIL 10 MG PO CAPS
10.0000 mg | ORAL_CAPSULE | Freq: Every day | ORAL | Status: DC
  Filled 2012-10-07: qty 1

## 2012-10-07 MED ORDER — LABETALOL HCL 300 MG PO TABS
300.0000 mg | ORAL_TABLET | Freq: Two times a day (BID) | ORAL | Status: DC
  Administered 2012-10-08: 300 mg via ORAL
  Filled 2012-10-07 (×4): qty 1

## 2012-10-07 MED ORDER — HEPARIN SODIUM (PORCINE) 5000 UNIT/ML IJ SOLN: 5000.0000 [IU] | Freq: Three times a day (TID) | INTRAMUSCULAR | Status: DC

## 2012-10-07 MED ORDER — HYDROCODONE-ACETAMINOPHEN 5-325 MG PO TABS
1.0000 | ORAL_TABLET | Freq: Four times a day (QID) | ORAL | Status: DC | PRN
  Administered 2012-10-08: 2 via ORAL
  Filled 2012-10-07: qty 2

## 2012-10-07 MED ORDER — FUROSEMIDE 10 MG/ML IJ SOLN
80.0000 mg | Freq: Two times a day (BID) | INTRAMUSCULAR | Status: DC
  Administered 2012-10-08 (×2): 80 mg via INTRAVENOUS
  Filled 2012-10-07 (×4): qty 8

## 2012-10-07 MED ORDER — SODIUM CHLORIDE 0.9 % IJ SOLN
3.0000 mL | INTRAMUSCULAR | Status: DC | PRN
  Administered 2012-10-09: 3 mL via INTRAVENOUS

## 2012-10-07 MED ORDER — AMLODIPINE BESYLATE 10 MG PO TABS
10.0000 mg | ORAL_TABLET | Freq: Every day | ORAL | Status: DC
  Filled 2012-10-07: qty 1

## 2012-10-07 MED ORDER — ASPIRIN 81 MG PO CHEW
81.0000 mg | CHEWABLE_TABLET | Freq: Every day | ORAL | Status: DC
  Administered 2012-10-08: 81 mg via ORAL
  Filled 2012-10-07: qty 1

## 2012-10-07 MED ORDER — ONDANSETRON HCL 4 MG/2ML IJ SOLN
4.0000 mg | Freq: Four times a day (QID) | INTRAMUSCULAR | Status: DC | PRN
  Administered 2012-10-19 – 2012-10-21 (×3): 4 mg via INTRAVENOUS
  Filled 2012-10-07 (×3): qty 2

## 2012-10-07 MED ORDER — SODIUM CHLORIDE 0.9 % IV SOLN
250.0000 mL | INTRAVENOUS | Status: DC | PRN
  Administered 2012-10-09 (×2): 250 mL via INTRAVENOUS

## 2012-10-07 MED ORDER — INSULIN ASPART PROT & ASPART (70-30 MIX) 100 UNIT/ML ~~LOC~~ SUSP
20.0000 [IU] | Freq: Two times a day (BID) | SUBCUTANEOUS | Status: DC
  Administered 2012-10-08: 20 [IU] via SUBCUTANEOUS
  Filled 2012-10-07: qty 10

## 2012-10-07 MED ORDER — SODIUM CHLORIDE 0.9 % IJ SOLN
3.0000 mL | Freq: Two times a day (BID) | INTRAMUSCULAR | Status: DC
  Administered 2012-10-08 – 2012-10-09 (×3): 3 mL via INTRAVENOUS

## 2012-10-07 MED ORDER — ACETAMINOPHEN 325 MG PO TABS: 650.0000 mg | ORAL_TABLET | ORAL | Status: DC | PRN

## 2012-10-07 MED ORDER — MORPHINE SULFATE 4 MG/ML IJ SOLN
4.0000 mg | Freq: Once | INTRAMUSCULAR | Status: AC
  Administered 2012-10-07: 4 mg via INTRAVENOUS
  Filled 2012-10-07: qty 1

## 2012-10-07 NOTE — ED Notes (Signed)
MD at bedside. 

## 2012-10-07 NOTE — H&P (Addendum)
Admission History and Physical   Patient ID: Jasmine [name], MRN: [id number], DOB: [birth year] 42 y.o. Date of Encounter: 10/07/2012, 11:37 PM  Primary Physician: Dois Davenport., MD Primary Cardiologist: Arvilla Meres Primary Electrophysiologist:  N/A  Chief Complaint: Chest Pain  History of Present Illness: Jasmine [name] is a 42 y.o. female with HFpEF, CKD, HTN, DM, OSA, who presents with sudden onset of SOB and chest pain this evening.  She states that for the past week she has had a non-productive cough without fever, congestion, otherwise has been in her USOH with stable 2-pillow orthopnea, no increased DOE/SOB, and stable edema.  She was taking a shower at ~6:30 this evening when she noted sudden onset of SOB.  After her shower while sitting she experienced severe substernal chest pressure, radiated to her left arm.  When EMS arrived her BP was 180s/90s per patient, and her chest pain improved significantly with SL NTG.  She denies diaphoresis. Currently she is still having chest pain, but less severe and different in quality, feels like a muscle strain and is tender to touch.  Remains SOB with some difficulty holding a conversation.  She is followed closely by Dr. Gala Romney and was last admitted for CHF exacerbation in 12/2011. Thorough w/u including cath with non-obstructive CAD in 2010, and: - V/Q scan on November 02, 2010, showing normal ventilation perfusion without evidence of PE.  - CT of the chest without contrast 10/2010 showing suspected multifocal alveolar edema in the bilateral upper lobes and superior left lower lobe. Multifocal infection considered less likely. Cardiomegaly with bilateral pleural effusions. No findings to suggest interstitial lung disease.  - 11/14 CPX Peak VO2: 10.9 % predicted peak VO2: 57.2% (corrects to 19.6 for ideal body weight), VE/VCO2 slope: 52 (to peak exercise) 43 (to RCP), OUES: 1.08, Peak RER: 1.19, Ventilatory Threshold: 7.2 % predicted peak VO2:  37.6%, O2pulse: 12 % predicted O2pulse: 90% - Sleep study completed 10/31. Dr Vassie Loll evaluated severe sleep apnea, placed on CPAP.  - Echo 01/18/12: EF 60%. High filling pressures. LA mildly to mod dilated. FV mildly dilated. RA mildly dilated  Last seen in clinic 6/30 and was doing well at that time, weight 273.  She takes her meds including lasix 80mg  BID regularly.  Does admit to some dietary indiscretion with a bologna sandwich yesterday.   In ED received morphine IV 4mg  x1 for chest pain.  1st set of trop neg.   Past Medical History  Diagnosis Date  . Diastolic heart failure     echo 11/03/10 EF 55-60%  . Hypertension   . Chronic kidney disease (CKD)     stage II-III  . Hyperlipidemia   . Uncontrolled diabetes mellitus   . Diabetic ketoacidosis     with seizures  . Anemia   . Diabetic gastroparesis   . Obesity   . Coronary artery disease     mild per cath 2010  . CKD (chronic kidney disease), stage IV 01/20/2012     Past Surgical History  Procedure Laterality Date  . Cholecystectomy    . Carpal tunnel release  2003      Current Facility-Administered Medications  Medication Dose Route Frequency Provider Last Rate Last Dose  . 0.9 %  sodium chloride infusion  250 mL Intravenous PRN Yaakov Guthrie, MD      . acetaminophen (TYLENOL) tablet 650 mg  650 mg Oral Q4H PRN Yaakov Guthrie, MD      . Melene Muller ON 10/08/2012] amLODipine (NORVASC) tablet 10 mg  10 mg Oral Daily Yaakov Guthrie, MD      . Melene Muller ON 10/08/2012] aspirin tablet 81 mg  81 mg Oral Daily Yaakov Guthrie, MD      . furosemide (LASIX) injection 80 mg  80 mg Intravenous BID Yaakov Guthrie, MD      . heparin injection 5,000 Units  5,000 Units Subcutaneous Q8H Yaakov Guthrie, MD      . HYDROcodone-acetaminophen (NORCO/VICODIN) 5-325 MG per tablet 1-2 tablet  1-2 tablet Oral Q6H PRN Yaakov Guthrie, MD      . Insulin Aspart Prot & Aspart (70-30) 100 UNIT/ML SUPN 20 Units  20 Units Subcutaneous BID WC Yaakov Guthrie, MD      . labetalol  (NORMODYNE) tablet 300 mg  300 mg Oral BID Yaakov Guthrie, MD      . ondansetron [facility]) injection 4 mg  4 mg Intravenous Q6H PRN Yaakov Guthrie, MD      . Melene Muller ON 10/08/2012] ramipril (ALTACE) tablet 10 mg  10 mg Oral Daily Yaakov Guthrie, MD      . sodium chloride 0.9 % injection 3 mL  3 mL Intravenous Q12H Yaakov Guthrie, MD      . sodium chloride 0.9 % injection 3 mL  3 mL Intravenous PRN Yaakov Guthrie, MD       Current Outpatient Prescriptions  Medication Sig Dispense Refill  . amLODipine (NORVASC) 10 MG tablet Take 1 tablet (10 mg total) by mouth daily.  30 tablet  6  . aspirin 81 MG tablet Take 81 mg by mouth daily as needed. For pain      . ferrous sulfate 325 (65 FE) MG tablet Take 325 mg by mouth 2 (two) times daily.        . furosemide (LASIX) 80 MG tablet Take 2 tablets (160 mg total) by mouth 2 (two) times daily.  120 tablet  6  . Hydrocodone-Acetaminophen 5-300 MG TABS Take 1 tablet by mouth 3 (three) times daily as needed. For pain      . Insulin Aspart Prot & Aspart (NOVOLOG MIX 70/30 FLEXPEN) (70-30) 100 UNIT/ML SUPN Inject 30-40 Units into the skin 2 (two) times daily with a meal. Take 40 units in the am & 30 units in the pm      . labetalol (NORMODYNE) 300 MG tablet Take 300 mg by mouth 2 (two) times daily.        . ramipril (ALTACE) 10 MG tablet Take 10 mg by mouth daily.       Marland Kitchen FREESTYLE LITE test strip Twice daily before meals.      . Lancets (FREESTYLE) lancets           Allergies: Allergies  Allergen Reactions  . Contrast Media [Iodinated Diagnostic Agents] Nausea And Vomiting  . Paroxetine Nausea And Vomiting  . Oxycodone Itching and Rash     Social History:  The patient  reports that she has never smoked. She has never used smokeless tobacco. She reports that  drinks alcohol. She reports that she does not use illicit drugs.   Family History:  The patient's family history includes Dementia in her father; Heart attack in her mother.   ROS:  Please see the history  of present illness.    All other systems reviewed and negative.   Vital Signs: Blood pressure 126/52, pulse 98, resp. rate 22, last menstrual period 09/27/2012, SpO2 94.00%.  PHYSICAL EXAM: General:  Well nourished, well developed, mildly SOB. HEENT: normal Lymph: no adenopathy Neck: JVP elevated with +HJR,  difficult exam due to habitus. Endocrine:  No thryomegaly Vascular: No carotid bruits; FA pulses 2+ bilaterally without bruits Cardiac:  normal S1, S2; RRR; no murmur Lungs:  Bilateral rales to mid lung fields, quiet breath sounds bilaterally.  Abd: soft, nontender, no hepatomegaly Ext: 2+ B/L LE edema. Musculoskeletal:  No deformities, BUE and BLE strength normal and equal Skin: warm and dry Neuro:  CNs 2-12 intact, no focal abnormalities noted Psych:  Normal affect   EKG:  NSR with 0.6mm ST depressions infero-laterally, increased from prior.  Labs:   Lab Results  Component Value Date   WBC 13.9* 10/07/2012   HGB 8.3* 10/07/2012   HCT 25.1* 10/07/2012   MCV 85.1 10/07/2012   PLT 270 10/07/2012    Recent Labs Lab 10/07/12 2126  NA 133*  K 4.5  CL 103  CO2 17*  BUN 58*  CREATININE 1.92*  CALCIUM 9.3  GLUCOSE 253*   No results found for this basename: CKTOTAL, CKMB, TROPONINI,  in the last 72 hours Lab Results  Component Value Date   CHOL 314* 11/03/2010   HDL 35* 11/03/2010   LDLCALC 247* 11/03/2010   TRIG 159* 11/03/2010   Lab Results  Component Value Date   DDIMER 1.28* 10/07/2012   BNP    Component Value Date/Time   PROBNP 1631.0* 10/07/2012 2126      Radiology/Studies:  Dg Chest 2 View  10/07/2012   CLINICAL DATA:  Chest pain. Shortness of breast.  EXAM: CHEST  2 VIEW  COMPARISON:  01/17/2012.  FINDINGS: Mild cardiopericardial enlargement. Perihilar vascular fullness with fissural thickening and probable Kerley lines in the lateral projection. There may be trace pleural effusions. No pneumothorax.  IMPRESSION: Cardiomegaly and pulmonary edema.    Electronically Signed   By: Tiburcio Pea   On: 10/07/2012 22:08     ASSESSMENT AND PLAN:   1. Chest Pain/SOB - Given her CXR findings, elevated JVP on exam, and elevated BNP, along with her week long history of a non-productive cough, I think her presentation is most consistent with an exacerbation of her HFpEF.  Chest pain is likely explained by an elevated LVEDP in the setting of volume overload and significant hypertension (SOB in shower, leading to anxiety, increased sympathetic tone, then hypertension and chest pain).   - ACS and PE are also on the differential but less likely.  Of note she does have a significant family history for ACS.  Will complete serial troponins and EKGs.  Hold on VQ scan for now.  Elevated DDimer very non-specific in setting of acute illness.      - Plan will be to continue home BP meds, increase diuresis with 80mg  IV lasix BID, first dose now.  Repeat TTE tomorrow (last done in 12/2011).  - Could consider addition of spironolactone to her regimen, although increased risk of hyperK with both ACE-I and CKD.   - Current chest pain is reproducible and likely musculoskeletal.  PRN tylenol/vicoden.   2. CKD - Stable at this point, follow with diuresis.  3.  IDDM - continue 70/30 insulin, cut dose in half with SSI coverage.  4. Anemia - chronic, on iron as outpatient  Heparin SQ for DVT ppx.   Addendum:  Patient with another episode of pressure like chest pain and worsening hypoxia placed on non-rebreather. She has received IV lasix but no diuresis yet.  Now chest pain free.  Will repeat EKG now, place in stepdown.  CXR already with pulmonary edema so will not repeat  at this time.  Will start theraputic anticoagulation given elevated DDimer and recurrence of CP.     Signed,  Yaakov Guthrie, MD 10/07/2012, 11:37 PM

## 2012-10-07 NOTE — ED Provider Notes (Signed)
Jasmine [name] is a 42 y.o. female    Face-to-face evaluation   History: She complains of sudden onset of chest pain or shortness of breath, while in the shower today. The discomfort has waxed and waned, but persisted. The chest pain, improved with nitroglycerin. She now has chest pain, 2/10. She sees her cardiologist regularly to followup on her diastolic heart failure.  Physical exam: Alert, cooperative, and appears uncomfortable. Heart regular rate and rhythm. No murmur. Lungs clear to a patient. Extremity mild pitting edema, bilaterally.  Medical screening examination/treatment/procedure(s) were conducted as a shared visit with non-physician practitioner(s) and myself.  I personally evaluated the patient during the encounter  Flint Melter, MD 10/08/12 [phone]

## 2012-10-07 NOTE — ED Provider Notes (Signed)
CSN: [id number]     Arrival date & time 10/07/12  2020 History  This chart was scribed for non-physician practitioner Roxy Horseman, PA-C working with Flint Melter, MD by Joaquin Music, ED Scribe. This patient was seen in room A13C/A13C and the patient's care was started at 8:43 PM.   Chief Complaint  Patient presents with  . Chest Pain    The history is provided by the patient. No language interpreter was used.   HPI Comments: Jasmine [name] is a 42 y.o. female who presents to the Emergency Department complaining of moderate to severe chest pain with associated SOB. Pt states that the pain started tonight at around 6:30pm while she was in the shower. Pt states that the pain radiates to her left arm. She states that she has had SOB before but has never had the chest pain she is experiencing. Cardiac risk factors include HTN, DM, HL, and family history of early heart disease. Pt states that the pain was 8/10, but after receiving Asprin, SL nitro, and morphine from EMS and now she states pain is now 4/10. Pt denies diaphoresis, but states she never gets sweaty. Additionally, pt endorses cough and pain with deep breathing.    Past Medical History  Diagnosis Date  . Diastolic heart failure     echo 11/03/10 EF 55-60%  . Hypertension   . Chronic kidney disease (CKD)     stage II-III  . Hyperlipidemia   . Uncontrolled diabetes mellitus   . Diabetic ketoacidosis     with seizures  . Anemia   . Diabetic gastroparesis   . Obesity   . Coronary artery disease     mild per cath 2010  . CKD (chronic kidney disease), stage IV 01/20/2012   Past Surgical History  Procedure Laterality Date  . Cholecystectomy    . Carpal tunnel release  2003   Family History  Problem Relation Age of Onset  . Heart attack Mother   . Dementia Father    History  Substance Use Topics  . Smoking status: Never Smoker   . Smokeless tobacco: Never Used  . Alcohol Use: Yes     Comment: occasionally    OB History   Grav Para Term Preterm Abortions TAB SAB Ect Mult Living                 Review of Systems A complete 10 system review of systems was obtained and all systems are negative except as noted in the HPI and PMH.    Allergies  Contrast media; Paroxetine; and Oxycodone  Home Medications   Current Outpatient Rx  Name  Route  Sig  Dispense  Refill  . amLODipine (NORVASC) 10 MG tablet   Oral   Take 1 tablet (10 mg total) by mouth daily.   30 tablet   6   . aspirin 81 MG tablet   Oral   Take 81 mg by mouth daily as needed.          . ferrous sulfate 325 (65 FE) MG tablet   Oral   Take 325 mg by mouth 2 (two) times daily.           . furosemide (LASIX) 80 MG tablet   Oral   Take 2 tablets (160 mg total) by mouth 2 (two) times daily.   120 tablet   6   . Insulin Aspart Prot & Aspart (NOVOLOG MIX 70/30 FLEXPEN) (70-30) 100 UNIT/ML SUPN   Subcutaneous  Inject 30-40 Units into the skin 2 (two) times daily with a meal. Take 40 units in the am & 30 units in the pm         . labetalol (NORMODYNE) 300 MG tablet   Oral   Take 300 mg by mouth 2 (two) times daily.           . ramipril (ALTACE) 10 MG tablet   Oral   Take 10 mg by mouth daily.          Marland Kitchen FREESTYLE LITE test strip      Twice daily before meals.         . Lancets (FREESTYLE) lancets                BP 148/69  Resp 21  Physical Exam  Nursing note and vitals reviewed. Constitutional: She is oriented to person, place, and time. She appears well-developed and well-nourished. No distress.  HENT:  Head: Normocephalic and atraumatic.  Eyes: EOM are normal.  Neck: Neck supple. No tracheal deviation present.  Cardiovascular: Regular rhythm and normal heart sounds.  Exam reveals no gallop and no friction rub.   No murmur heard. Mildly tachycardic  Pulmonary/Chest: Effort normal and breath sounds normal. No respiratory distress. She has no wheezes. She has no rales. She exhibits no  tenderness.  Abdominal: Soft. She exhibits no distension and no mass. There is no tenderness. There is no rebound and no guarding.  Musculoskeletal: Normal range of motion.  Neurological: She is alert and oriented to person, place, and time.  Skin: Skin is warm and dry.  Psychiatric: She has a normal mood and affect. Her behavior is normal.    ED Course  Procedures  DIAGNOSTIC STUDIES:   COORDINATION OF CARE: 8:56 PM-Discussed treatment plan which includes blood work and EKG  with pt at bedside and pt agreed to plan.    Results for orders placed during the hospital encounter of 10/07/12  CBC WITH DIFFERENTIAL      Result Value Range   WBC 13.9 (*) 4.0 - 10.5 K/uL   RBC 2.95 (*) 3.87 - 5.11 MIL/uL   Hemoglobin 8.3 (*) 12.0 - 15.0 g/dL   HCT 24.4 (*) 01.0 - 27.2 %   MCV 85.1  78.0 - 100.0 fL   MCH 28.1  26.0 - 34.0 pg   MCHC 33.1  30.0 - 36.0 g/dL   RDW 53.6  64.4 - 03.4 %   Platelets 270  150 - 400 K/uL   Neutrophils Relative % 83 (*) 43 - 77 %   Neutro Abs 11.5 (*) 1.7 - 7.7 K/uL   Lymphocytes Relative 7 (*) 12 - 46 %   Lymphs Abs 1.0  0.7 - 4.0 K/uL   Monocytes Relative 7  3 - 12 %   Monocytes Absolute 0.9  0.1 - 1.0 K/uL   Eosinophils Relative 2  0 - 5 %   Eosinophils Absolute 0.3  0.0 - 0.7 K/uL   Basophils Relative 0  0 - 1 %   Basophils Absolute 0.1  0.0 - 0.1 K/uL  BASIC METABOLIC PANEL      Result Value Range   Sodium 133 (*) 135 - 145 mEq/L   Potassium 4.5  3.5 - 5.1 mEq/L   Chloride 103  96 - 112 mEq/L   CO2 17 (*) 19 - 32 mEq/L   Glucose, Bld 253 (*) 70 - 99 mg/dL   BUN 58 (*) 6 - 23 mg/dL   Creatinine, Ser  1.92 (*) 0.50 - 1.10 mg/dL   Calcium 9.3  8.4 - 69.6 mg/dL   GFR calc non Af Amer 31 (*) >90 mL/min   GFR calc Af Amer 36 (*) >90 mL/min  PRO B NATRIURETIC PEPTIDE      Result Value Range   Pro B Natriuretic peptide (BNP) 1631.0 (*) 0 - 125 pg/mL  D-DIMER, QUANTITATIVE      Result Value Range   D-Dimer, Quant 1.28 (*) 0.00 - 0.48 ug/mL-FEU  POCT  I-STAT TROPONIN I      Result Value Range   Troponin i, poc 0.00  0.00 - 0.08 ng/mL   Comment 3            Dg Chest 2 View  10/07/2012   CLINICAL DATA:  Chest pain. Shortness of breast.  EXAM: CHEST  2 VIEW  COMPARISON:  01/17/2012.  FINDINGS: Mild cardiopericardial enlargement. Perihilar vascular fullness with fissural thickening and probable Kerley lines in the lateral projection. There may be trace pleural effusions. No pneumothorax.  IMPRESSION: Cardiomegaly and pulmonary edema.   Electronically Signed   By: Tiburcio Pea   On: 10/07/2012 22:08    ED ECG REPORT  I personally interpreted this EKG   Date: 10/07/2012   Rate: 103  Rhythm: sinus tachycardia  QRS Axis: normal  Intervals: normal  ST/T Wave abnormalities: normal  Conduction Disutrbances:none  Narrative Interpretation:   Old EKG Reviewed: none available  ] MDM   1. Chest pain    Pt with chest pain and shortness of breath, concern for ACS, will check basic labs, EKG, chest  X-rays. Pt has significant risk factors for heart disease. Pt also states that she has a cough and she is mildly tachycardic, because of this will check a D-Dimer, otherwise pt is at low risk for PE. Will also check BNP as the pt has history of heart failure. Patient discussed with Dr. Effie Shy, who recommends consulting cardiology.  Patient received aspirin from EMS.   Patient will be seen by [facility].   Patient has been seen by [facility] cardiology, who will admit the patient.    I personally performed the services described in this documentation, which was scribed in my presence. The recorded information has been reviewed and is accurate.     Roxy Horseman, PA-C 10/07/12 2338

## 2012-10-07 NOTE — ED Notes (Signed)
Patient presents to ED via [facility]. Pt states that she was in the shower when she had a sudden onset of left sided chest "pressure" that radiated down her left arm. En route EMS gave pt 324 ASA, 4mg  of zofran, 2 nitro sublingual, and 6mg  of morphine. Pt now rating pain 0/10 which was initially 8/10. Per EMS pt stated that there is a family hx of early MI and pt has a hx of CHF. Pt has edema to both legs per EMS. A&Ox4 upon arrival to ED. BP 152/77. Resp. 16.

## 2012-10-08 ENCOUNTER — Inpatient Hospital Stay (HOSPITAL_COMMUNITY): Payer: 59

## 2012-10-08 ENCOUNTER — Encounter (HOSPITAL_COMMUNITY): Payer: Self-pay | Admitting: *Deleted

## 2012-10-08 DIAGNOSIS — N179 Acute kidney failure, unspecified: Secondary | ICD-10-CM

## 2012-10-08 DIAGNOSIS — J9600 Acute respiratory failure, unspecified whether with hypoxia or hypercapnia: Secondary | ICD-10-CM

## 2012-10-08 DIAGNOSIS — I517 Cardiomegaly: Secondary | ICD-10-CM

## 2012-10-08 DIAGNOSIS — E1139 Type 2 diabetes mellitus with other diabetic ophthalmic complication: Secondary | ICD-10-CM

## 2012-10-08 DIAGNOSIS — J9601 Acute respiratory failure with hypoxia: Secondary | ICD-10-CM | POA: Diagnosis present

## 2012-10-08 DIAGNOSIS — D638 Anemia in other chronic diseases classified elsewhere: Secondary | ICD-10-CM

## 2012-10-08 DIAGNOSIS — R079 Chest pain, unspecified: Secondary | ICD-10-CM

## 2012-10-08 DIAGNOSIS — N189 Chronic kidney disease, unspecified: Secondary | ICD-10-CM

## 2012-10-08 DIAGNOSIS — R7989 Other specified abnormal findings of blood chemistry: Secondary | ICD-10-CM

## 2012-10-08 DIAGNOSIS — I5033 Acute on chronic diastolic (congestive) heart failure: Principal | ICD-10-CM

## 2012-10-08 DIAGNOSIS — I10 Essential (primary) hypertension: Secondary | ICD-10-CM

## 2012-10-08 DIAGNOSIS — G4733 Obstructive sleep apnea (adult) (pediatric): Secondary | ICD-10-CM

## 2012-10-08 LAB — BLOOD GAS, ARTERIAL
Acid-base deficit: 7.6 mmol/L — ABNORMAL HIGH (ref 0.0–2.0)
Bicarbonate: 15.9 mEq/L — ABNORMAL LOW (ref 20.0–24.0)
Drawn by: 24513
Drawn by: 252031
FIO2: 1 %
PEEP: 10 cmH2O
Patient temperature: 98.6
RATE: 16 resp/min
TCO2: 17.7 mmol/L (ref 0–100)
pCO2 arterial: 30.3 mmHg — ABNORMAL LOW (ref 35.0–45.0)
pH, Arterial: 7.362 (ref 7.350–7.450)
pH, Arterial: 7.32 — ABNORMAL LOW (ref 7.350–7.450)
pO2, Arterial: 51.4 mmHg — ABNORMAL LOW (ref 80.0–100.0)

## 2012-10-08 LAB — MAGNESIUM: Magnesium: 2 mg/dL (ref 1.5–2.5)

## 2012-10-08 LAB — BASIC METABOLIC PANEL
GFR calc Af Amer: 29 mL/min — ABNORMAL LOW (ref >90)
GFR calc non Af Amer: 25 mL/min — ABNORMAL LOW (ref >90)
Glucose, Bld: 297 mg/dL — ABNORMAL HIGH (ref 70–99)
Potassium: 5 mEq/L (ref 3.5–5.1)
Sodium: 132 mEq/L — ABNORMAL LOW (ref 135–145)

## 2012-10-08 LAB — TROPONIN I
Troponin I: <0.3 ng/mL (ref <0.30)
Troponin I: 3.21 ng/mL (ref <0.30)

## 2012-10-08 LAB — GLUCOSE, CAPILLARY: Glucose-Capillary: 210 mg/dL — ABNORMAL HIGH (ref 70–99)

## 2012-10-08 LAB — PROCALCITONIN: Procalcitonin: 2.65 ng/mL

## 2012-10-08 MED ORDER — NITROGLYCERIN IN D5W 200-5 MCG/ML-% IV SOLN
2.0000 ug/min | INTRAVENOUS | Status: DC
  Administered 2012-10-08: 5 ug/min via INTRAVENOUS
  Filled 2012-10-08: qty 250

## 2012-10-08 MED ORDER — MIDAZOLAM HCL 2 MG/2ML IJ SOLN
INTRAMUSCULAR | Status: AC
  Administered 2012-10-08: 2 mg
  Filled 2012-10-08: qty 4

## 2012-10-08 MED ORDER — ASPIRIN 81 MG PO CHEW: 81.0000 mg | CHEWABLE_TABLET | Freq: Once | ORAL | Status: AC

## 2012-10-08 MED ORDER — INFLUENZA VAC SPLIT QUAD 0.5 ML IM SUSP
0.5000 mL | INTRAMUSCULAR | Status: AC
  Administered 2012-10-09: 0.5 mL via INTRAMUSCULAR
  Filled 2012-10-08: qty 0.5

## 2012-10-08 MED ORDER — CHLORHEXIDINE GLUCONATE 0.12 % MT SOLN
15.0000 mL | Freq: Two times a day (BID) | OROMUCOSAL | Status: DC
  Administered 2012-10-08 – 2012-10-14 (×13): 15 mL via OROMUCOSAL
  Filled 2012-10-08 (×12): qty 15

## 2012-10-08 MED ORDER — MIDAZOLAM BOLUS VIA INFUSION
1.0000 mg | INTRAVENOUS | Status: DC | PRN
  Filled 2012-10-08: qty 2

## 2012-10-08 MED ORDER — INSULIN ASPART 100 UNIT/ML ~~LOC~~ SOLN
0.0000 [IU] | Freq: Three times a day (TID) | SUBCUTANEOUS | Status: DC
Start: 2012-10-08 — End: 2012-10-08
  Administered 2012-10-08: 11 [IU] via SUBCUTANEOUS

## 2012-10-08 MED ORDER — AZITHROMYCIN 500 MG IV SOLR
500.0000 mg | INTRAVENOUS | Status: DC
  Administered 2012-10-08 – 2012-10-11 (×4): 500 mg via INTRAVENOUS
  Filled 2012-10-08 (×5): qty 500

## 2012-10-08 MED ORDER — SODIUM CHLORIDE 0.9 % IV SOLN
1.0000 mg/h | INTRAVENOUS | Status: DC
  Administered 2012-10-08: 4 mg/h via INTRAVENOUS
  Filled 2012-10-08: qty 10

## 2012-10-08 MED ORDER — BIOTENE DRY MOUTH MT LIQD
15.0000 mL | Freq: Four times a day (QID) | OROMUCOSAL | Status: DC
  Administered 2012-10-08 – 2012-10-13 (×21): 15 mL via OROMUCOSAL

## 2012-10-08 MED ORDER — HEPARIN (PORCINE) IN NACL 100-0.45 UNIT/ML-% IJ SOLN
1600.0000 [IU]/h | INTRAMUSCULAR | Status: DC
  Administered 2012-10-08 – 2012-10-09 (×2): 1300 [IU]/h via INTRAVENOUS
  Filled 2012-10-08 (×3): qty 250

## 2012-10-08 MED ORDER — INSULIN GLARGINE 100 UNIT/ML ~~LOC~~ SOLN
10.0000 [IU] | Freq: Every day | SUBCUTANEOUS | Status: DC
  Administered 2012-10-08 – 2012-10-09 (×2): 10 [IU] via SUBCUTANEOUS
  Filled 2012-10-08 (×2): qty 0.1

## 2012-10-08 MED ORDER — FENTANYL BOLUS VIA INFUSION
25.0000 ug | Freq: Four times a day (QID) | INTRAVENOUS | Status: DC | PRN
  Filled 2012-10-08: qty 100

## 2012-10-08 MED ORDER — SODIUM CHLORIDE 0.9 % IV SOLN
25.0000 ug/h | INTRAVENOUS | Status: DC
  Administered 2012-10-08 (×2): 50 ug/h via INTRAVENOUS
  Administered 2012-10-09: 200 ug/h via INTRAVENOUS
  Administered 2012-10-09: 50 ug/h via INTRAVENOUS
  Administered 2012-10-09: 150 ug/h via INTRAVENOUS
  Administered 2012-10-10 – 2012-10-11 (×3): 200 ug/h via INTRAVENOUS
  Administered 2012-10-12: 75 ug/h via INTRAVENOUS
  Filled 2012-10-08 (×7): qty 50

## 2012-10-08 MED ORDER — FUROSEMIDE 10 MG/ML IJ SOLN
10.0000 mg/h | INTRAVENOUS | Status: DC
  Administered 2012-10-08: 8 mg/h via INTRAVENOUS
  Administered 2012-10-09 – 2012-10-10 (×2): 10 mg/h via INTRAVENOUS
  Filled 2012-10-08 (×5): qty 25

## 2012-10-08 MED ORDER — PANTOPRAZOLE SODIUM 40 MG IV SOLR
40.0000 mg | INTRAVENOUS | Status: DC
  Administered 2012-10-08 – 2012-10-09 (×2): 40 mg via INTRAVENOUS
  Filled 2012-10-08 (×3): qty 40

## 2012-10-08 MED ORDER — MIDAZOLAM HCL 2 MG/2ML IJ SOLN
4.0000 mg | Freq: Once | INTRAMUSCULAR | Status: AC
  Administered 2012-10-08: 4 mg via INTRAVENOUS

## 2012-10-08 MED ORDER — RAMIPRIL 2.5 MG PO CAPS
2.5000 mg | ORAL_CAPSULE | Freq: Every day | ORAL | Status: DC
  Filled 2012-10-08 (×2): qty 1

## 2012-10-08 MED ORDER — DEXTROSE 5 % IV SOLN
2.0000 g | INTRAVENOUS | Status: DC
  Administered 2012-10-08 – 2012-10-11 (×4): 2 g via INTRAVENOUS
  Filled 2012-10-08 (×5): qty 2

## 2012-10-08 MED ORDER — ENOXAPARIN SODIUM 120 MG/0.8ML ~~LOC~~ SOLN
120.0000 mg | SUBCUTANEOUS | Status: AC
  Administered 2012-10-08: 120 mg via SUBCUTANEOUS
  Filled 2012-10-08: qty 0.8

## 2012-10-08 MED ORDER — ETOMIDATE 2 MG/ML IV SOLN
25.0000 mg | Freq: Once | INTRAVENOUS | Status: AC
  Administered 2012-10-08: 25 mg via INTRAVENOUS

## 2012-10-08 MED ORDER — ASPIRIN 81 MG PO CHEW
324.0000 mg | CHEWABLE_TABLET | Freq: Once | ORAL | Status: AC
  Administered 2012-10-09: 324 mg via ORAL
  Filled 2012-10-08: qty 4

## 2012-10-08 MED ORDER — MIDAZOLAM HCL 2 MG/2ML IJ SOLN
INTRAMUSCULAR | Status: AC
  Filled 2012-10-08: qty 4

## 2012-10-08 MED ORDER — INSULIN GLARGINE 100 UNIT/ML ~~LOC~~ SOLN
10.0000 [IU] | Freq: Every day | SUBCUTANEOUS | Status: DC
  Filled 2012-10-08: qty 0.1

## 2012-10-08 MED ORDER — HEPARIN SODIUM (PORCINE) 5000 UNIT/ML IJ SOLN
5000.0000 [IU] | Freq: Three times a day (TID) | INTRAMUSCULAR | Status: DC
  Administered 2012-10-08: 5000 [IU] via SUBCUTANEOUS
  Filled 2012-10-08 (×3): qty 1

## 2012-10-08 MED ORDER — ENOXAPARIN SODIUM 150 MG/ML ~~LOC~~ SOLN
130.0000 mg | Freq: Two times a day (BID) | SUBCUTANEOUS | Status: DC
  Filled 2012-10-08 (×2): qty 1

## 2012-10-08 MED ORDER — SUCCINYLCHOLINE CHLORIDE 20 MG/ML IJ SOLN
100.0000 mg | Freq: Once | INTRAMUSCULAR | Status: AC
  Administered 2012-10-08: 100 mg via INTRAVENOUS

## 2012-10-08 MED ORDER — MIDAZOLAM HCL 2 MG/2ML IJ SOLN
2.0000 mg | Freq: Once | INTRAMUSCULAR | Status: AC
  Administered 2012-10-08: 2 mg via INTRAVENOUS

## 2012-10-08 MED ORDER — INSULIN ASPART 100 UNIT/ML ~~LOC~~ SOLN
0.0000 [IU] | SUBCUTANEOUS | Status: DC
  Administered 2012-10-08: 3 [IU] via SUBCUTANEOUS
  Administered 2012-10-08: 5 [IU] via SUBCUTANEOUS
  Administered 2012-10-08 – 2012-10-09 (×2): 3 [IU] via SUBCUTANEOUS
  Administered 2012-10-09: 2 [IU] via SUBCUTANEOUS
  Administered 2012-10-09 (×2): 3 [IU] via SUBCUTANEOUS
  Administered 2012-10-09: 2 [IU] via SUBCUTANEOUS
  Administered 2012-10-09: 3 [IU] via SUBCUTANEOUS
  Administered 2012-10-10 (×2): 5 [IU] via SUBCUTANEOUS
  Administered 2012-10-10: 8 [IU] via SUBCUTANEOUS
  Administered 2012-10-10 (×2): 5 [IU] via SUBCUTANEOUS

## 2012-10-08 MED ORDER — FERROUS SULFATE 325 (65 FE) MG PO TABS
325.0000 mg | ORAL_TABLET | Freq: Two times a day (BID) | ORAL | Status: DC
  Administered 2012-10-08 – 2012-10-09 (×2): 325 mg via ORAL
  Filled 2012-10-08 (×4): qty 1

## 2012-10-08 MED ORDER — SODIUM CHLORIDE 0.9 % IV SOLN
1.0000 mg/h | INTRAVENOUS | Status: DC
  Administered 2012-10-08: 6 mg/h via INTRAVENOUS
  Administered 2012-10-09 – 2012-10-11 (×4): 2 mg/h via INTRAVENOUS
  Filled 2012-10-08 (×6): qty 10

## 2012-10-08 MED ORDER — INSULIN ASPART 100 UNIT/ML ~~LOC~~ SOLN
0.0000 [IU] | Freq: Every day | SUBCUTANEOUS | Status: DC
Start: 2012-10-08 — End: 2012-10-08

## 2012-10-08 NOTE — Progress Notes (Signed)
  Echocardiogram 2D Echocardiogram has been performed.  Jorje Guild 10/08/2012, 3:26 PM

## 2012-10-08 NOTE — Progress Notes (Signed)
Pt arrived to the floor oxygen saturations 82% on 4 liters per nasal cannula, switched pt to 50% venturi mask obtained saturations 86-91 %.  Placed pt on 100 % non-rebreather with saturations 92-96 % with respirations from 29-38 per minute. Patient has a dry hacking cough Dr. Jon Billings on the floor and notified of the above , he ordered prn BiPAP per respiitory therapy. When pt goes to sleep respirations decrease and saturation increase called respiratory  Therapy to evaluate pt for BiPAP, will continue to monitor.

## 2012-10-08 NOTE — Consult Note (Signed)
Intubation Procedure Note Manisha L Shadwick [id number] 1970-12-02  Procedure: Intubation Indications: Respiratory insufficiency  Procedure Details Consent: Unable to obtain consent because of emergent medical necessity. Time Out: Verified patient identification, verified procedure, site/side was marked, verified correct patient position, special equipment/implants available, medications/allergies/relevent history reviewed, required imaging and test results available.  Performed  Medications used: etomidate and succinylcholine Equipment used: Glidescope Grade I View Correct placement confirmed by by auscultation and ETCO2 monitor Tube secured at 22 cm at the lip  Evaluation Hemodynamic Status: BP stable throughout; O2 sats: stable throughout Patient's Current Condition: stable Complications: No apparent complications Patient did tolerate procedure well. Chest X-ray ordered to verify placement.  CXR: pending.   Overton Mam, M.D. Pulmonary and Critical Care Medicine Call E-link with questions [phone] 10/08/2012

## 2012-10-08 NOTE — Progress Notes (Signed)
ANTICOAGULATION CONSULT NOTE - Initial Consult  Pharmacy Consult for Lovenox Indication: chest pain/ACS and r/o PE  Allergies  Allergen Reactions  . Contrast Media [Iodinated Diagnostic Agents] Nausea And Vomiting  . Paroxetine Nausea And Vomiting  . Oxycodone Itching and Rash    Patient Measurements: Height: 5\' 6"  (167.6 cm) Weight: 285 lb 0.9 oz (129.3 kg) IBW/kg (Calculated) : 59.3  Vital Signs: Temp: 98.9 F (37.2 C) (09/14 0258) Temp src: Oral (09/14 0258) BP: 136/60 mmHg (09/14 0258) Pulse Rate: 103 (09/14 0258)  Labs:  Recent Labs  10/07/12 2126 10/08/12 0148  HGB 8.3*  --   HCT 25.1*  --   PLT 270  --   CREATININE 1.92*  --   TROPONINI  --  <0.30    Estimated Creatinine Clearance: 52.6 ml/min (by C-G formula based on Cr of 1.92).   Medical History: Past Medical History  Diagnosis Date  . Diastolic heart failure     echo 11/03/10 EF 55-60%  . Hypertension   . Chronic kidney disease (CKD)     stage II-III  . Hyperlipidemia   . Uncontrolled diabetes mellitus   . Diabetic ketoacidosis     with seizures  . Anemia   . Diabetic gastroparesis   . Obesity   . Coronary artery disease     mild per cath 2010  . CKD (chronic kidney disease), stage IV 01/20/2012    Medications:  Prescriptions prior to admission  Medication Sig Dispense Refill  . amLODipine (NORVASC) 10 MG tablet Take 1 tablet (10 mg total) by mouth daily.  30 tablet  6  . aspirin 81 MG tablet Take 81 mg by mouth daily as needed. For pain      . ferrous sulfate 325 (65 FE) MG tablet Take 325 mg by mouth 2 (two) times daily.        . furosemide (LASIX) 80 MG tablet Take 2 tablets (160 mg total) by mouth 2 (two) times daily.  120 tablet  6  . Hydrocodone-Acetaminophen 5-300 MG TABS Take 1 tablet by mouth 3 (three) times daily as needed. For pain      . Insulin Aspart Prot & Aspart (NOVOLOG MIX 70/30 FLEXPEN) (70-30) 100 UNIT/ML SUPN Inject 30-40 Units into the skin 2 (two) times daily with a  meal. Take 40 units in the am & 30 units in the pm      . labetalol (NORMODYNE) 300 MG tablet Take 300 mg by mouth 2 (two) times daily.        . ramipril (ALTACE) 10 MG tablet Take 10 mg by mouth daily.       Marland Kitchen FREESTYLE LITE test strip Twice daily before meals.      . Lancets (FREESTYLE) lancets        Scheduled:  . amLODipine  10 mg Oral Daily  . aspirin  81 mg Oral Daily  . enoxaparin (LOVENOX) injection  130 mg Subcutaneous Q12H  . furosemide  80 mg Intravenous BID  . [START ON 10/09/2012] influenza vac split quadrivalent PF  0.5 mL Intramuscular Tomorrow-1000  . insulin aspart protamine- aspart  20 Units Subcutaneous BID WC  . labetalol  300 mg Oral BID  . ramipril  10 mg Oral Daily  . sodium chloride  3 mL Intravenous Q12H    Assessment: 42yo female c/o sudden onset of chest pressure radiating down arm, has FMH of early CAD, also with elevated D-dimer, to begin LMWH.  Goal of Therapy:  Anti-Xa level 0.6-1.2 units/ml  4hrs after LMWH dose given Monitor platelets by anticoagulation protocol: Yes   Plan:  Will begin Lovenox 130mg  SQ Q12H and monitor CBC.  Vernard Gambles, PharmD, BCPS  10/08/2012,3:34 AM

## 2012-10-08 NOTE — Progress Notes (Addendum)
SUBJECTIVE: Pt is intubated and sedated, but does open eyes to verbal stimuli. Her husband says that although the patient has severe sleep apnea, she does not use CPAP. Additionally, she eats fast food on her lunch breaks at work and does not exercise at all.     Intake/Output Summary (Last 24 hours) at 10/08/12 1218 Last data filed at 10/08/12 1100  Gross per 24 hour  Intake    156 ml  Output    910 ml  Net   -754 ml    Current Facility-Administered Medications  Medication Dose Route Frequency Provider Last Rate Last Dose  . 0.9 %  sodium chloride infusion  250 mL Intravenous PRN Yaakov Guthrie, MD      . acetaminophen (TYLENOL) tablet 650 mg  650 mg Oral Q4H PRN Yaakov Guthrie, MD      . antiseptic oral rinse (BIOTENE) solution 15 mL  15 mL Mouth Rinse QID Yaakov Guthrie, MD   15 mL at 10/08/12 1200  . aspirin chewable tablet 81 mg  81 mg Oral Daily Yaakov Guthrie, MD      . azithromycin (ZITHROMAX) 500 mg in dextrose 5 % 250 mL IVPB  500 mg Intravenous Q24H Bernadene Person, NP   500 mg at 10/08/12 1217  . cefTRIAXone (ROCEPHIN) 2 g in dextrose 5 % 50 mL IVPB  2 g Intravenous Q24H Bernadene Person, NP      . chlorhexidine (PERIDEX) 0.12 % solution 15 mL  15 mL Mouth Rinse BID Yaakov Guthrie, MD   15 mL at 10/08/12 0728  . enoxaparin (LOVENOX) injection 130 mg  130 mg Subcutaneous Q12H Colleen Can, [facility]      . fentaNYL (SUBLIMAZE) 10 mcg/mL in sodium chloride 0.9 % 250 mL infusion  25-400 mcg/hr Intravenous Titrated Oretha Milch, MD 5 mL/hr at 10/08/12 0836 50 mcg/hr at 10/08/12 0836   And  . fentaNYL (SUBLIMAZE) bolus via infusion 25-100 mcg  25-100 mcg Intravenous Q6H PRN Oretha Milch, MD      . furosemide (LASIX) injection 80 mg  80 mg Intravenous BID Yaakov Guthrie, MD   80 mg at 10/08/12 0817  . [START ON 10/09/2012] influenza vac split quadrivalent PF (FLUARIX) injection 0.5 mL  0.5 mL Intramuscular Tomorrow-1000 Yaakov Guthrie, MD      . insulin aspart (novoLOG)  injection 0-15 Units  0-15 Units Subcutaneous Q4H Bernadene Person, NP   5 Units at 10/08/12 1210  . insulin glargine (LANTUS) injection 10 Units  10 Units Subcutaneous QHS Oretha Milch, MD      . midazolam (VERSED) 1 mg/mL in sodium chloride 0.9 % 50 mL infusion  1-5 mg/hr Intravenous Titrated Oretha Milch, MD 6 mL/hr at 10/08/12 0843 6 mg/hr at 10/08/12 0843   And  . midazolam (VERSED) bolus via infusion 1-2 mg  1-2 mg Intravenous Q2H PRN Oretha Milch, MD      . midazolam (VERSED) 2 MG/2ML injection           . ondansetron (ZOFRAN) injection 4 mg  4 mg Intravenous Q6H PRN Yaakov Guthrie, MD      . pantoprazole (PROTONIX) injection 40 mg  40 mg Intravenous Q24H Merwyn Katos, MD   40 mg at 10/08/12 0801  . sodium chloride 0.9 % injection 3 mL  3 mL Intravenous Q12H Yaakov Guthrie, MD   3 mL at 10/08/12 1209  . sodium chloride 0.9 % injection 3 mL  3 mL Intravenous PRN  Yaakov Guthrie, MD        Filed Vitals:   10/08/12 1000 10/08/12 1100 10/08/12 1125 10/08/12 1144  BP:   98/49   Pulse: 82 79 79   Temp:    97.4 F (36.3 C)  TempSrc:    Axillary  Resp: 21 21 24    Height:      Weight:      SpO2: 100% 100% 100%     PHYSICAL EXAM General: intubated and sedated. Neck: JVP difficult to assess Lungs: Diminished at bases with limited exam CV: Nondisplaced PMI.  Heart regular S1/S2, no S3/S4, no murmur. Trace peripheral edema. Normal pedal pulses.  Abdomen: Soft, nontender, obese. Neurologic: responsive to verbal stimuli. Extremities: No clubbing or cyanosis.   TELEMETRY: Reviewed telemetry pt in normal sinus rhythm.  LABS: Basic Metabolic Panel:  Recent Labs  40/98/11 2119 10/07/12 2126 10/08/12 0535  NA  --  133* 132*  K  --  4.5 5.0  CL  --  103 102  CO2  --  17* 16*  GLUCOSE  --  253* 297*  BUN  --  58* 59*  CREATININE  --  1.92* 2.32*  CALCIUM  --  9.3 8.9  MG 2.0  --   --    Liver Function Tests: No results found for this basename: AST, ALT, ALKPHOS, BILITOT,  PROT, ALBUMIN,  in the last 72 hours No results found for this basename: LIPASE, AMYLASE,  in the last 72 hours CBC:  Recent Labs  10/07/12 2126  WBC 13.9*  NEUTROABS 11.5*  HGB 8.3*  HCT 25.1*  MCV 85.1  PLT 270   Cardiac Enzymes:  Recent Labs  10/08/12 0148 10/08/12 0535  TROPONINI <0.30 0.40*   BNP: No components found with this basename: POCBNP,  D-Dimer:  Recent Labs  10/07/12 2126  DDIMER 1.28*   Hemoglobin A1C: No results found for this basename: HGBA1C,  in the last 72 hours Fasting Lipid Panel: No results found for this basename: CHOL, HDL, LDLCALC, TRIG, CHOLHDL, LDLDIRECT,  in the last 72 hours Thyroid Function Tests: No results found for this basename: TSH, T4TOTAL, FREET3, T3FREE, THYROIDAB,  in the last 72 hours Anemia Panel: No results found for this basename: VITAMINB12, FOLATE, FERRITIN, TIBC, IRON, RETICCTPCT,  in the last 72 hours  RADIOLOGY: Dg Chest 2 View  10/07/2012   CLINICAL DATA:  Chest pain. Shortness of breast.  EXAM: CHEST  2 VIEW  COMPARISON:  01/17/2012.  FINDINGS: Mild cardiopericardial enlargement. Perihilar vascular fullness with fissural thickening and probable Kerley lines in the lateral projection. There may be trace pleural effusions. No pneumothorax.  IMPRESSION: Cardiomegaly and pulmonary edema.   Electronically Signed   By: Tiburcio Pea   On: 10/07/2012 22:08   Dg Chest Port 1 View  10/08/2012   *RADIOLOGY REPORT*  Clinical Data: Intubation  PORTABLE CHEST - 1 VIEW  Comparison: Prior radiograph from 10/07/2012  Findings: Endotracheal tube is in place with tip located 4.1 cm above the carina.  Enteric tube courses into the abdomen. Cardiomegaly is unchanged.  There has been interval worsening of diffuse pulmonary edema.  No pneumothorax.  No definite focal infiltrates are identified.  Osseous structures are unchanged.  IMPRESSION: 1.  Tip of endotracheal tube 4.1 cm above the carina. 2.  Interval worsening of pulmonary edema as  compared to 10/07/2012.   Original Report Authenticated By: Rise Mu, M.D.      ASSESSMENT AND PLAN: 1. Acute on chronic diastolic heart failure: it appears  the patient has been noncompliant with dietary restrictions (excess sodium, eats fast food fairly frequently), and has also been noncompliant with CPAP for severe sleep apnea. She has had only modest urine output. For this reason, I am starting a Lasix infusion at 8 mg/hr in addition to a nitroglycerin infusion, with the hopes of improving her hemodynamics in order to stimulate a larger diuretic response. Her arterial SBP readings are in the 115-125 mmHg range. In order to completely exclude a superimposed ischemic etiology, I will check an additional troponin, with the previous one being only mildly elevated at 0.4. I will discontinue her Lovenox given her acute on chronic renal insufficiency, and start prophylactic heparin injections (tid). If her third troponin is markedly elevated, I will start a therapeutic heparin infusion. Awaiting results of echocardiogram (uncertain if it was completed last night). Will give Ramipril 2.5 mg via OG tube, and hold Labetalol for now but if BP remains stable, will initiate and titrate up ACEI.  2. CAD: nonobstructive by prior cath. Await echo to assess for interval change in systolic function, as well as for new wall motion abnormalities. Repeat troponin to see if therapeutic heparin infusion is warranted. Continue ASA 81 mg daily through OG tube.   3. Acute hypoxemic respiratory failure: due to #1 most likely, but given leukocytosis and low-grade fever, agree with broad-spectrum antimicrobial coverage.  4. Acute on chronic CKD: continue to monitor with aggressive diuresis.  5. Anemia: etiology appears to be CKD as per prior records. Currently Hgb at 8.3. Will need to aim to keep it >8. Will restart iron therapy.   Prentice Docker, M.D., F.A.C.C.   ADDENDUM: Troponin elevated at 3.21. Will  start heparin infusion, and increase ASA to 325 mg daily. Will need cath to evaluate for ischemic etiology of heart failure.

## 2012-10-08 NOTE — Plan of Care (Signed)
Problem: Phase I Progression Outcomes Goal: Dyspnea controlled at rest (HF) Outcome: Not Progressing Pt looks labored at times on a 100% NRB.  Goal: Pain controlled with appropriate interventions Outcome: Progressing Pt c/o of pain with cough of 3/10 Goal: Voiding-avoid urinary catheter unless indicated Outcome: Progressing Pt voiding, being gently diuresed  Goal: Hemodynamically stable Outcome: Progressing VSS other than respirations and saturations already noted

## 2012-10-08 NOTE — Progress Notes (Signed)
RT was called to assess the patient at this time. RN says the patient is here with fluid overload. Patient is currently on NRB with o2 sats 93% RR 28 and HR 90. Patient lung sounds are wet. RT recommends BIPAP at this time. Patient also states she wears CPAP at home. Hopefully the BIPAP will give her a little extra support and help her rest more comfortably. RT will continue to assist as needed.  Ellen Henri RRT RCP

## 2012-10-08 NOTE — Progress Notes (Signed)
Weaned peep to 5 per MD order.  Pt tol well so far.

## 2012-10-08 NOTE — Consult Note (Signed)
PULMONARY  / CRITICAL CARE MEDICINE  Name: Jasmine [name] MRN: [id number] DOB: 1971/01/12    ADMISSION DATE:  10/07/2012 CONSULTATION DATE:  10/08/12  REFERRING MD :  Yaakov Guthrie, MD PRIMARY SERVICE: Cardiology  CHIEF COMPLAINT:  SOB  BRIEF PATIENT DESCRIPTION:  42 years female with diastolic heart failure, CKD, HTN, DM, OSA, who presents with sudden onset of SOB and hypoxemia. Chest X ray consistent with acute pulmonary edema. Did not tolerate BiPAP and required endotracheal intubation.    SIGNIFICANT EVENTS / STUDIES:  Chest X ray with severe pulmonary edema.   LINES / TUBES: - Peripheral IV's  CULTURES:  ANTIBIOTICS: No antibiotics  HISTORY OF PRESENT ILLNESS:   42 years female with diastolic heart failure, CKD, HTN, DM, OSA, who presents with  SOB and hypoxemia. This morning had sudden onset of SOB and chest pain associated with systolic pressures in the 180's. She had some non productive cough for a week but denies fever.  Initially admitted to [facility] by Cardiology. Chest X ray consistent with acute pulmonary edema. ABG on 100% NRB mask showed a PO2 of 51. Did not tolerate BiPAP and required endotracheal intubation.   PAST MEDICAL HISTORY :  Past Medical History  Diagnosis Date  . Diastolic heart failure     echo 11/03/10 EF 55-60%  . Hypertension   . Chronic kidney disease (CKD)     stage II-III  . Hyperlipidemia   . Uncontrolled diabetes mellitus   . Diabetic ketoacidosis     with seizures  . Anemia   . Diabetic gastroparesis   . Obesity   . Coronary artery disease     mild per cath 2010  . CKD (chronic kidney disease), stage IV 01/20/2012   Past Surgical History  Procedure Laterality Date  . Cholecystectomy    . Carpal tunnel release  2003   Prior to Admission medications   Medication Sig Start Date End Date Taking? Authorizing Provider  amLODipine (NORVASC) 10 MG tablet Take 1 tablet (10 mg total) by mouth daily. 02/23/12  Yes Hadassah Pais, PA-C  aspirin  81 MG tablet Take 81 mg by mouth daily as needed. For pain   Yes Historical Provider, MD  ferrous sulfate 325 (65 FE) MG tablet Take 325 mg by mouth 2 (two) times daily.     Yes Historical Provider, MD  furosemide (LASIX) 80 MG tablet Take 2 tablets (160 mg total) by mouth 2 (two) times daily. 09/28/12  Yes Dolores Patty, MD  Hydrocodone-Acetaminophen 5-300 MG TABS Take 1 tablet by mouth 3 (three) times daily as needed. For pain   Yes Historical Provider, MD  Insulin Aspart Prot & Aspart (NOVOLOG MIX 70/30 FLEXPEN) (70-30) 100 UNIT/ML SUPN Inject 30-40 Units into the skin 2 (two) times daily with a meal. Take 40 units in the am & 30 units in the pm   Yes Historical Provider, MD  labetalol (NORMODYNE) 300 MG tablet Take 300 mg by mouth 2 (two) times daily.     Yes Historical Provider, MD  ramipril (ALTACE) 10 MG tablet Take 10 mg by mouth daily.    Yes Historical Provider, MD  FREESTYLE LITE test strip Twice daily before meals. 11/11/10   Historical Provider, MD  Lancets (FREESTYLE) lancets  11/11/10   Historical Provider, MD   Allergies  Allergen Reactions  . Contrast Media [Iodinated Diagnostic Agents] Nausea And Vomiting  . Paroxetine Nausea And Vomiting  . Oxycodone Itching and Rash    FAMILY HISTORY:  Family  History  Problem Relation Age of Onset  . Heart attack Mother   . Dementia Father    SOCIAL HISTORY:  reports that she has never smoked. She has never used smokeless tobacco. She reports that  drinks alcohol. She reports that she does not use illicit drugs.  REVIEW OF SYSTEMS:  Unable to provide because of respiratory distress.   SUBJECTIVE:   VITAL SIGNS: Temp:  [98 F (36.7 C)-98.9 F (37.2 C)] 98.6 F (37 C) (09/14 0600) Pulse Rate:  [85-103] 87 (09/14 0600) Resp:  [16-38] 28 (09/14 0600) BP: (85-148)/(41-69) 90/50 mmHg (09/14 0600) SpO2:  [87 %-100 %] 90 % (09/14 0600) FiO2 (%):  [50 %-100 %] 100 % (09/14 0600) Weight:  [282 lb 3 oz (128 kg)-285 lb 0.9 oz (129.3  kg)] 282 lb 3 oz (128 kg) (09/14 0600) HEMODYNAMICS:   VENTILATOR SETTINGS: Vent Mode:  [-]  FiO2 (%):  [50 %-100 %] 100 % INTAKE / OUTPUT: Intake/Output     09/13 0701 - 09/14 0700   Urine (mL/kg/hr) 400   Total Output 400   Net -400         PHYSICAL EXAMINATION: General: Obese patient in moderate to severe respiratory distress. Eyes: Anicteric sclerae. ENT: Oropharynx clear. Moist mucous membranes. No thrush Lymph: No cervical, supraclavicular, or axillary lymphadenopathy. Heart: Normal S1, S2. No murmurs, rubs, or gallops appreciated. No bruits, equal pulses. Lungs: Bilateral diffuse crackles. No wheezing.  Abdomen: Abdomen soft, non-tender and not distended, normoactive bowel sounds. No hepatosplenomegaly or masses. Musculoskeletal: No clubbing or synovitis. LE edema 2+ Skin: No rashes or lesions Neuro: No focal neurologic deficits.  LABS:  CBC Recent Labs     10/07/12  2126  WBC  13.9*  HGB  8.3*  HCT  25.1*  PLT  270   Coag's No results found for this basename: APTT, INR,  in the last 72 hours BMET Recent Labs     10/07/12  2126  NA  133*  K  4.5  CL  103  CO2  17*  BUN  58*  CREATININE  1.92*  GLUCOSE  253*   Electrolytes Recent Labs     10/07/12  2119  10/07/12  2126  CALCIUM   --   9.3  MG  2.0   --    Sepsis Markers No results found for this basename: LACTICACIDVEN, PROCALCITON, O2SATVEN,  in the last 72 hours ABG Recent Labs     10/08/12  0456  PHART  7.362  PCO2ART  30.3*  PO2ART  51.4*   Liver Enzymes No results found for this basename: AST, ALT, ALKPHOS, BILITOT, ALBUMIN,  in the last 72 hours Cardiac Enzymes Recent Labs     10/07/12  2126  10/08/12  0148  TROPONINI   --   <0.30  PROBNP  1631.0*   --    Glucose Recent Labs     10/08/12  0232  GLUCAP  268*    Imaging Dg Chest 2 View  10/07/2012   CLINICAL DATA:  Chest pain. Shortness of breast.  EXAM: CHEST  2 VIEW  COMPARISON:  01/17/2012.  FINDINGS: Mild  cardiopericardial enlargement. Perihilar vascular fullness with fissural thickening and probable Kerley lines in the lateral projection. There may be trace pleural effusions. No pneumothorax.  IMPRESSION: Cardiomegaly and pulmonary edema.   Electronically Signed   By: Tiburcio Pea   On: 10/07/2012 22:08     CXR:  FINDINGS:  Mild cardiopericardial enlargement. Perihilar vascular fullness with  fissural  thickening and probable Kerley lines in the lateral  projection. There may be trace pleural effusions. No pneumothorax.  IMPRESSION:  Cardiomegaly and pulmonary edema.   ASSESSMENT / PLAN:  PULMONARY A: 1) Acute hypoxemic respiratory failure secondary to pulmonary edema P:   - Will transfer to ICU - Mechanical ventilation   - PRVC, Vt: 8cc/kg, PEEP: 10, RR: 16, FiO2: 100% and adjust to keep O2 sat > 94% - VAP prevention order set  CARDIOVASCULAR A:  1) Acute diastolic heart failure P:  - Management per primary team  RENAL A:   1) CKD stable P:   - Will follow BMP with diuresis  GASTROINTESTINAL A:   1) No issues P:   - GI prophylaxis with protonix  HEMATOLOGIC A:   1) No issues P:  - Will follow CBC  INFECTIOUS A:   1) Clinical picture more compatible with pulmonary edema. In the setting of intubation and worsening chest x ray, consider broad spectrum antibiotics and cultures. Antibiotics can be removed if she clears her X ray with diuresis.   ENDOCRINE A:   1) DM P:   - Novolog sliding scale  NEUROLOGIC A:   - Intubated, Neurologically intact before intubation. P:   - Sedation with versed drip   I have personally obtained a history, examined the patient, evaluated laboratory and imaging results, formulated the assessment and plan and placed orders. CRITICAL CARE:  Critical Care Time devoted to patient care services described in this note is 60 minutes.   Overton Mam, MD Pulmonary and Critical Care Medicine [facility] Pager: [phone]  10/08/2012, 6:25 AM

## 2012-10-08 NOTE — Consult Note (Addendum)
PULMONARY  / CRITICAL CARE MEDICINE  Name: Jasmine [name] MRN: [id number] DOB: May 14, 1970    ADMISSION DATE:  10/07/2012 CONSULTATION DATE:  10/08/12  REFERRING MD :  Yaakov Guthrie, MD PRIMARY SERVICE: Cardiology  CHIEF COMPLAINT:  SOB  BRIEF PATIENT DESCRIPTION:  42 years female with diastolic heart failure, CKD, HTN, DM, OSA, admitted 9/13 with sudden onset of SOB and hypoxemia. Chest X ray consistent with acute pulmonary edema. Did not tolerate BiPAP and required endotracheal intubation.    SIGNIFICANT EVENTS / STUDIES:    LINES / TUBES: ETT 9/13>>> R Radial aline 9/13>>>  CULTURES:   ANTIBIOTICS: Rocephin 9/14>>> Azithro 9/14>>>  SUBJECTIVE:  Trop =0.4.  Intermittent agitation, improved with fent/versed.  Afebrile Poor UO  VITAL SIGNS: Temp:  [98 F (36.7 C)-99.7 F (37.6 C)] 99.7 F (37.6 C) (09/14 0754) Pulse Rate:  [82-103] 82 (09/14 1000) Resp:  [16-41] 21 (09/14 1000) BP: (85-148)/(41-69) 93/48 mmHg (09/14 0755) SpO2:  [87 %-100 %] 100 % (09/14 1000) FiO2 (%):  [50 %-100 %] 80 % (09/14 0755) Weight:  [282 lb 3 oz (128 kg)-285 lb 0.9 oz (129.3 kg)] 282 lb 3 oz (128 kg) (09/14 0600) HEMODYNAMICS:   VENTILATOR SETTINGS: Vent Mode:  [-] PRVC FiO2 (%):  [50 %-100 %] 80 % Set Rate:  [16 bmp] 16 bmp Vt Set:  [470 mL] 470 mL PEEP:  [10 cmH20] 10 cmH20 Plateau Pressure:  [25 cmH20] 25 cmH20 INTAKE / OUTPUT: Intake/Output     09/13 0701 - 09/14 0700 09/14 0701 - 09/15 0700   I.V. (mL/kg) 20 (0.2) 57 (0.4)   NG/GT 30    Total Intake(mL/kg) 50 (0.4) 57 (0.4)   Urine (mL/kg/hr) 700 110 (0.2)   Total Output 700 110   Net -650 -53          PHYSICAL EXAMINATION: General: Obese patient , oral ETT, intubated HEENT: mm moist, no JVD, large neck, ETT Heart: Normal S1, S2. No murmurs, rubs, or gallops appreciated. No bruits, equal pulses. Lungs:  Resps even non labored on vent, few scattered crackles. No wheezing.  Abdomen: Abdomen soft, non-tender and not  distended, normoactive bowel sounds. No hepatosplenomegaly or masses. Musculoskeletal: No clubbing or synovitis. LE edema 2+ Skin: No rashes or lesions Neuro: No focal neurologic deficits.  LABS:  CBC Recent Labs     10/07/12  2126  WBC  13.9*  HGB  8.3*  HCT  25.1*  PLT  270   Coag's No results found for this basename: APTT, INR,  in the last 72 hours BMET Recent Labs     10/07/12  2126  10/08/12  0535  NA  133*  132*  K  4.5  5.0  CL  103  102  CO2  17*  16*  BUN  58*  59*  CREATININE  1.92*  2.32*  GLUCOSE  253*  297*   Electrolytes Recent Labs     10/07/12  2119  10/07/12  2126  10/08/12  0535  CALCIUM   --   9.3  8.9  MG  2.0   --    --    Sepsis Markers No results found for this basename: LACTICACIDVEN, PROCALCITON, O2SATVEN,  in the last 72 hours ABG Recent Labs     10/08/12  0456  10/08/12  0640  PHART  7.362  7.320*  PCO2ART  30.3*  31.9*  PO2ART  51.4*  169.0*   Liver Enzymes No results found for this basename: AST, ALT, ALKPHOS,  BILITOT, ALBUMIN,  in the last 72 hours Cardiac Enzymes Recent Labs     10/07/12  2126  10/08/12  0148  10/08/12  0535  TROPONINI   --   <0.30  0.40*  PROBNP  1631.0*   --    --    Glucose Recent Labs     10/08/12  0232  10/08/12  0756  GLUCAP  268*  323*    Imaging Dg Chest 2 View  10/07/2012   CLINICAL DATA:  Chest pain. Shortness of breast.  EXAM: CHEST  2 VIEW  COMPARISON:  01/17/2012.  FINDINGS: Mild cardiopericardial enlargement. Perihilar vascular fullness with fissural thickening and probable Kerley lines in the lateral projection. There may be trace pleural effusions. No pneumothorax.  IMPRESSION: Cardiomegaly and pulmonary edema.   Electronically Signed   By: Tiburcio Pea   On: 10/07/2012 22:08   Dg Chest Port 1 View  10/08/2012   *RADIOLOGY REPORT*  Clinical Data: Intubation  PORTABLE CHEST - 1 VIEW  Comparison: Prior radiograph from 10/07/2012  Findings: Endotracheal tube is in place with tip  located 4.1 cm above the carina.  Enteric tube courses into the abdomen. Cardiomegaly is unchanged.  There has been interval worsening of diffuse pulmonary edema.  No pneumothorax.  No definite focal infiltrates are identified.  Osseous structures are unchanged.  IMPRESSION: 1.  Tip of endotracheal tube 4.1 cm above the carina. 2.  Interval worsening of pulmonary edema as compared to 10/07/2012.   Original Report Authenticated By: Rise Mu, M.D.    ASSESSMENT / PLAN:  PULMONARY A: 1) Acute hypoxemic respiratory failure secondary to pulmonary edema Doubt PE - D Dmier non specific in setting acute illness, SOB/hypoxia explained by pulm edema.   OSA - non compliant with CPAP P:   - Mechanical ventilation, decrease PEEP as satn permits - VAP prevention order set - SBT in am   CARDIOVASCULAR A:  1) Acute diastolic heart failure 2) elevated troponin  P:  - diuresis  - ?further workup troponin bump  - asa - 2D echo  - cont trend troponin  - treatment dose , prefer heparin to lovenox for ?ACS -consider hydralazine/ nitrates -defer to cards  RENAL A:   1) CKD stable P:   - Will follow BMP with diuresis  GASTROINTESTINAL A:   1) No issues P:   - GI prophylaxis with protonix  HEMATOLOGIC A:   1) No issues P:  - Will follow CBC  INFECTIOUS A:   1) ??CAP --  Clinical picture more compatible with pulmonary edema.  However, in setting worsening resp status, low grade fever and leukocytosis will start broad spectrum abx. Antibiotics can be removed if she clears her X ray with diuresis. P:  Rocephin, Azithro  Sputum culture  Blood cultures  Check pct to help guide abx therapy  ENDOCRINE A:   1) DM P:   - add lantus 9/14, SSI   NEUROLOGIC A:   - Intubated, Neurologically intact before intubation. P:   - continuous sedation for now , minimise versed once PEEP down - Daily WUA    I have personally obtained a history, examined the patient, evaluated  laboratory and imaging results, formulated the assessment and plan and placed orders. CRITICAL CARE:  Critical Care Time devoted to patient care services described in this note is 35  minutes.   *Care during the described time interval was provided by me and/or other providers on the critical care team. I have reviewed this patient's  available data, including medical history, events of note, physical examination and test results as part of my evaluation.   Cyril Mourning MD. Tonny Bollman. [place] [facility] Pager [phone] If no response call [phone]

## 2012-10-08 NOTE — Progress Notes (Signed)
ANTICOAGULATION CONSULT NOTE - Initial Consult  Pharmacy Consult for Heparin Indication: chest pain/ACS  Allergies  Allergen Reactions  . Contrast Media [Iodinated Diagnostic Agents] Nausea And Vomiting  . Paroxetine Nausea And Vomiting  . Oxycodone Itching and Rash    Patient Measurements: Height: 5\' 6"  (167.6 cm) Weight: 282 lb 3 oz (128 kg) IBW/kg (Calculated) : 59.3 Heparin Dosing Weight: 92 kg  Vital Signs: Temp: 98.4 F (36.9 C) (09/14 1601) Temp src: Oral (09/14 1601) BP: 120/53 mmHg (09/14 1528) Pulse Rate: 81 (09/14 1600)  Labs:  Recent Labs  10/07/12 2126 10/08/12 0148 10/08/12 0535 10/08/12 1253  HGB 8.3*  --   --   --   HCT 25.1*  --   --   --   PLT 270  --   --   --   CREATININE 1.92*  --  2.32*  --   TROPONINI  --  <0.30 0.40* 3.21*    Estimated Creatinine Clearance: 43.3 ml/min (by C-G formula based on Cr of 2.32).   Medical History: Past Medical History  Diagnosis Date  . Diastolic heart failure     echo 11/03/10 EF 55-60%  . Hypertension   . Chronic kidney disease (CKD)     stage II-III  . Hyperlipidemia   . Uncontrolled diabetes mellitus   . Diabetic ketoacidosis     with seizures  . Anemia   . Diabetic gastroparesis   . Obesity   . Coronary artery disease     mild per cath 2010  . CKD (chronic kidney disease), stage IV 01/20/2012   Assessment: 42 year old female admitted with sudden onset of chest pressure.  Troponin positive.   Starting heparin drip, last dose of Lovenox at 2 am with decreasing renal function  Goal of Therapy:  Heparin level 0.3-0.7 units/ml Monitor platelets by anticoagulation protocol: Yes   Plan:  1) Heparin drip at 1300 units / hr 2) Daily heparin level, CBC  Thank you. Okey Regal, PharmD [phone]  10/08/2012,4:59 PM

## 2012-10-08 NOTE — Progress Notes (Signed)
criticalCRITICAL VALUE ALERT  Critical value received:  Troponin 3.21  Date of notification:  10/08/12  Time of notification:  1405  Critical value read back:yes  Nurse who received alert:  Acey Lav  MD notified (1st page):  [facility]  Time of first page:  1410  MD notified (2nd page):  Time of second page:  Responding MD:  Joni Reining NP for Corinda Gubler  Time MD responded:  [phone]

## 2012-10-08 NOTE — ED Notes (Signed)
Spoke with admitting doctor about pt current chest pain- states he will change order to step down bed.

## 2012-10-08 NOTE — Progress Notes (Signed)
CRITICAL VALUE ALERT  Critical value received:  Troponin.40 CBG 323  Date of notification:  10/08/12  Time of notification:  0810  Critical value read back:yes  Nurse who received alert:  Acey Lav  MD notified (1st page):  [facility] cardiology  Time of first page:  0840  MD notified (2nd page):  Time of second page:  Responding MD:  Bailey Mech NP  Time MD responded:  [phone]

## 2012-10-08 NOTE — Progress Notes (Signed)
CRITICAL VALUE ALERT  Critical value received:  Troponin .40  Date of notification:  10/08/12  Time of notification:  0810  Critical value read back:yes  Nurse who received alert:  Acey Lav  MD notified (1st page):  Dr Vassie Loll  Time of first page:  0815  MD notified (2nd page):  Time of second page:  Responding MD:  Vassie Loll  Time MD responded:  [phone]

## 2012-10-08 NOTE — Procedures (Signed)
Arterial Catheter Insertion Procedure Note Jasmine [name] [id number] [birth year]  Procedure: Insertion of Arterial Catheter  Indications: Blood pressure monitoring and Frequent blood sampling  Procedure Details Consent: Unable to obtain consent because of emergent medical necessity. Time Out: Verified patient identification, verified procedure, site/side was marked, verified correct patient position, special equipment/implants available, medications/allergies/relevent history reviewed, required imaging and test results available.  Performed Assisted by Merdis Delay RRT,RCP  Maximum sterile technique was used including antiseptics, cap, gloves, gown, hand hygiene and mask. Skin prep: Chlorhexidine; local anesthetic administered 20 gauge catheter was inserted into right radial artery using the Seldinger technique.  Evaluation Blood flow good; BP tracing good. Complications: No apparent complications.   Erskine Speed 10/08/2012

## 2012-10-08 NOTE — Progress Notes (Signed)
RT tried BIPAP 10/5 and 8/5 at this time but patient could not tolerate the pressure. Patient then went into anxiety fit and hyperventilating for about 5 min. RN called DR for orders. RT drew ABG. Waiting for ABG results and further instruction at this time. Patient is aware that if she cannot tolerate the BIPAP machine, intubation may be the next step.

## 2012-10-08 NOTE — Progress Notes (Signed)
RT on the floor, to place BiPAP saturations 87% respirations 36-39 on 100 % Non rebreatther, pt unable to tolerate hyperventilating Dr. Jon Billings at the bedside ABG ordered resulted, Dr Jon Billings ordered to intubate. Orders received to transfer to 2310  Report called, cedric significant other called  To notify that patient was being transferred to 2310

## 2012-10-09 ENCOUNTER — Inpatient Hospital Stay (HOSPITAL_COMMUNITY): Payer: 59

## 2012-10-09 LAB — HEPARIN LEVEL (UNFRACTIONATED): Heparin Unfractionated: 0.34 IU/mL (ref 0.30–0.70)

## 2012-10-09 LAB — CBC
Hemoglobin: 7.6 g/dL — ABNORMAL LOW (ref 12.0–15.0)
MCH: 28.3 pg (ref 26.0–34.0)
MCHC: 32.8 g/dL (ref 30.0–36.0)
Platelets: 264 10*3/uL (ref 150–400)
RBC: 2.69 MIL/uL — ABNORMAL LOW (ref 3.87–5.11)

## 2012-10-09 LAB — POCT I-STAT 3, ART BLOOD GAS (G3+)
Acid-base deficit: 10 mmol/L — ABNORMAL HIGH (ref 0.0–2.0)
Bicarbonate: 15.7 mEq/L — ABNORMAL LOW (ref 20.0–24.0)
Bicarbonate: 16.1 mEq/L — ABNORMAL LOW (ref 20.0–24.0)
O2 Saturation: 74 %
O2 Saturation: 84 %
Patient temperature: 99.6
pCO2 arterial: 45.8 mmHg — ABNORMAL HIGH (ref 35.0–45.0)
pO2, Arterial: 51 mmHg — ABNORMAL LOW (ref 80.0–100.0)
pO2, Arterial: 58 mmHg — ABNORMAL LOW (ref 80.0–100.0)

## 2012-10-09 LAB — TROPONIN I: Troponin I: 0.87 ng/mL (ref <0.30)

## 2012-10-09 LAB — HEMOGLOBIN AND HEMATOCRIT, BLOOD
HCT: 23.9 % — ABNORMAL LOW (ref 36.0–46.0)
Hemoglobin: 7.8 g/dL — ABNORMAL LOW (ref 12.0–15.0)

## 2012-10-09 LAB — GLUCOSE, CAPILLARY
Glucose-Capillary: 117 mg/dL — ABNORMAL HIGH (ref 70–99)
Glucose-Capillary: 152 mg/dL — ABNORMAL HIGH (ref 70–99)
Glucose-Capillary: 167 mg/dL — ABNORMAL HIGH (ref 70–99)
Glucose-Capillary: 184 mg/dL — ABNORMAL HIGH (ref 70–99)

## 2012-10-09 LAB — BASIC METABOLIC PANEL
Calcium: 8.9 mg/dL (ref 8.4–10.5)
GFR calc Af Amer: 21 mL/min — ABNORMAL LOW (ref >90)
GFR calc non Af Amer: 18 mL/min — ABNORMAL LOW (ref >90)
Potassium: 4.1 mEq/L (ref 3.5–5.1)
Sodium: 135 mEq/L (ref 135–145)

## 2012-10-09 MED ORDER — PRO-STAT SUGAR FREE PO LIQD
30.0000 mL | Freq: Four times a day (QID) | ORAL | Status: DC
  Administered 2012-10-09 – 2012-10-12 (×13): 30 mL
  Filled 2012-10-09 (×16): qty 30

## 2012-10-09 MED ORDER — SODIUM CHLORIDE 0.9 % IJ SOLN: 10.0000 mL | Freq: Two times a day (BID) | INTRAMUSCULAR | Status: DC

## 2012-10-09 MED ORDER — VITAL HIGH PROTEIN PO LIQD
1000.0000 mL | ORAL | Status: DC
  Administered 2012-10-09 – 2012-10-10 (×2): 1000 mL
  Filled 2012-10-09 (×5): qty 1000

## 2012-10-09 MED ORDER — FERROUS SULFATE 220 (44 FE) MG/5ML PO ELIX
220.0000 mg | ORAL_SOLUTION | Freq: Two times a day (BID) | ORAL | Status: DC
  Filled 2012-10-09 (×2): qty 5

## 2012-10-09 MED ORDER — ACETAMINOPHEN 160 MG/5ML PO SOLN
650.0000 mg | Freq: Four times a day (QID) | ORAL | Status: DC | PRN
  Administered 2012-10-13: 650 mg via ORAL
  Filled 2012-10-09: qty 20.3

## 2012-10-09 MED ORDER — PANTOPRAZOLE SODIUM 40 MG PO PACK
40.0000 mg | PACK | ORAL | Status: DC
  Administered 2012-10-10 – 2012-10-12 (×3): 40 mg
  Filled 2012-10-09 (×3): qty 20

## 2012-10-09 MED ORDER — ATORVASTATIN CALCIUM 40 MG PO TABS
40.0000 mg | ORAL_TABLET | Freq: Every day | ORAL | Status: DC
  Filled 2012-10-09: qty 1

## 2012-10-09 MED ORDER — FERROUS SULFATE 300 (60 FE) MG/5ML PO SYRP
300.0000 mg | ORAL_SOLUTION | Freq: Two times a day (BID) | ORAL | Status: DC
  Administered 2012-10-09 – 2012-10-10 (×3): 300 mg
  Filled 2012-10-09 (×4): qty 5

## 2012-10-09 MED ORDER — DOPAMINE-DEXTROSE 3.2-5 MG/ML-% IV SOLN: 2.0000 ug/kg/min | INTRAVENOUS | Status: DC

## 2012-10-09 MED ORDER — SODIUM CHLORIDE 0.9 % IJ SOLN
10.0000 mL | INTRAMUSCULAR | Status: DC | PRN
  Administered 2012-10-10: 10 mL

## 2012-10-09 MED ORDER — ATORVASTATIN CALCIUM 40 MG PO TABS
40.0000 mg | ORAL_TABLET | Freq: Every day | ORAL | Status: DC
  Administered 2012-10-09: 40 mg
  Filled 2012-10-09 (×2): qty 1

## 2012-10-09 MED ORDER — ASPIRIN 81 MG PO CHEW
81.0000 mg | CHEWABLE_TABLET | Freq: Every day | ORAL | Status: DC
Start: 2012-10-09 — End: 2012-10-12
  Administered 2012-10-09 – 2012-10-12 (×4): 81 mg
  Filled 2012-10-09 (×5): qty 1

## 2012-10-09 MED ORDER — VITAL AF 1.2 CAL PO LIQD
1000.0000 mL | ORAL | Status: DC
  Filled 2012-10-09 (×2): qty 1000

## 2012-10-09 MED ORDER — ASPIRIN 81 MG PO CHEW: 81.0000 mg | CHEWABLE_TABLET | Freq: Every day | ORAL | Status: DC

## 2012-10-09 MED ORDER — DOPAMINE-DEXTROSE 3.2-5 MG/ML-% IV SOLN
3.0000 ug/kg/min | INTRAVENOUS | Status: DC
  Administered 2012-10-09: 2 ug/kg/min via INTRAVENOUS
  Administered 2012-10-10 – 2012-10-11 (×2): 3 ug/kg/min via INTRAVENOUS
  Filled 2012-10-09 (×2): qty 250

## 2012-10-09 NOTE — Procedures (Signed)
Central Venous Catheter Insertion Procedure Note Jasmine [name] [id number] 1970/07/28  Procedure: Insertion of Central Venous Catheter Indications: Assessment of intravascular volume, Drug and/or fluid administration and Frequent blood sampling  Procedure Details Consent: Risks of procedure as well as the alternatives and risks of each were explained to the (patient/caregiver).  Consent for procedure obtained. Time Out: Verified patient identification, verified procedure, site/side was marked, verified correct patient position, special equipment/implants available, medications/allergies/relevent history reviewed, required imaging and test results available.  Performed  Maximum sterile technique was used including antiseptics, cap, gloves and gown. Skin prep: Chlorhexidine; local anesthetic administered A antimicrobial bonded/coated triple lumen catheter was placed in the left internal jugular vein using the Seldinger technique.  Evaluation Blood flow good Complications: No apparent complications. One stitch to close the entry area. Patient did tolerate procedure well. Chest X-ray ordered to verify placement.  CXR: pending.   Signed:  Dow Adolph, MD PGY-2 Internal Medicine Teaching Service Pager: [phone] 10/09/2012, 2:46 PM    I was present for procedure.  Post-procedure CXR shows CVL in good position.  Coralyn Helling, MD [facility] Pulmonary/Critical Care 10/09/2012, 3:49 PM Pager:  [phone] After 3pm call: [phone]

## 2012-10-09 NOTE — Progress Notes (Signed)
Patient desated into 60%. RASS 3+. Sitting up in bed and trying to pull out ETT. Biting on tube, asynchronous with ventilator. HR ST into 130's and RR increased to 40s and becoming diaphoretic. Sedation restarted. Fentanyl 100 mcg and increased to 150 mcg/hr and Midazolam restarted at 2 mg/hr. Dr. Craige Cotta examined patient and ABG performed.

## 2012-10-09 NOTE — Plan of Care (Signed)
Problem: Phase I Progression Outcomes Goal: VTE prophylaxis Outcome: Completed/Met Date Met:  10/09/12 On heparin drip. Goal: Hemodynamically stable Outcome: Progressing Dopamine drip started. Lasix increased to 10 mg/hr. ST 110-120's. SBP 110-140's. Goal: Baseline oxygen/pH stable Outcome: Not Progressing PH decreased to 7.15 and decreased pO2. FiO2 up to 70% today and suctioning thick tan secretions and a small plug. Goal: Patient tolerating nututrition at goal Outcome: Progressing Vital TF started and supplemental protein. Goal: Patient tolerating weaning plan Outcome: Not Progressing FiO2 increased today. Tolerated weaning for a very short before becoming very agitated and had a very strong gag. Was trying to pull out ETT.

## 2012-10-09 NOTE — Progress Notes (Signed)
PULMONARY  / CRITICAL CARE MEDICINE  Name: Aamari L Gilmartin MRN: [id number] DOB: Nov 24, 1970    ADMISSION DATE:  10/07/2012 CONSULTATION DATE:  10/08/12  REFERRING MD :  Yaakov Guthrie, MD PRIMARY SERVICE: Cardiology  CHIEF COMPLAINT:  SOB  BRIEF PATIENT DESCRIPTION:  42 years female with diastolic heart failure, CKD, HTN, DM, OSA, admitted 9/13 with sudden onset of SOB and hypoxemia. Chest X ray consistent with acute pulmonary edema. Did not tolerate BiPAP and required endotracheal intubation.    SIGNIFICANT EVENTS:  STUDIES:  Echo 9/14 >> mild LVH, EF 55 to 60%, grade 2 diastolic dysfx  LINES / TUBES: ETT 9/13>>> R Radial aline 9/13>>>  CULTURES: Blood 9/14 >> Sputum 9/14 >>   ANTIBIOTICS: Rocephin 9/14>>> Azithro 9/14>>>  SUBJECTIVE:  Good Vt with SBT, but had oxygen desaturation >> ABG showed hypercapnia during SBT.  VITAL SIGNS: Temp:  [97.4 F (36.3 C)-99 F (37.2 C)] 98.8 F (37.1 C) (09/15 0750) Pulse Rate:  [79-113] 113 (09/15 0855) Resp:  [19-27] 24 (09/15 0855) BP: (98-149)/(49-61) 143/60 mmHg (09/15 0855) SpO2:  [93 %-100 %] 94 % (09/15 0855) FiO2 (%):  [40 %-60 %] 40 % (09/15 0855) Weight:  [281 lb 8.4 oz (127.7 kg)] 281 lb 8.4 oz (127.7 kg) (09/15 0350) VENTILATOR SETTINGS: Vent Mode:  [-] CPAP;PSV FiO2 (%):  [40 %-60 %] 40 % Set Rate:  [16 bmp] 16 bmp Vt Set:  [470 mL] 470 mL PEEP:  [5 cmH20-10 cmH20] 5 cmH20 Pressure Support:  [5 cmH20] 5 cmH20 Plateau Pressure:  [19 cmH20-25 cmH20] 19 cmH20 INTAKE / OUTPUT: Intake/Output     09/14 0701 - 09/15 0700 09/15 0701 - 09/16 0700   I.V. (mL/kg) 1095.5 (8.6)    NG/GT 160    IV Piggyback 300    Total Intake(mL/kg) 1555.5 (12.2)    Urine (mL/kg/hr) 1790 (0.6)    Emesis/NG output 450 (0.1)    Total Output 2240     Net -684.5            PHYSICAL EXAMINATION: General: Anxious with WUA HEENT: ETT in place Heart: Regular, tachycardic, no murmur Lungs: b/l rhonchi Abdomen: soft, non tender, + bowel  sounds. Musculoskeletal: 1+ edema Skin: No rashes  Neuro: RASS +1, follows commands, moves extremities  LABS:  CBC Recent Labs     10/07/12  2126  10/09/12  0400  WBC  13.9*  11.4*  HGB  8.3*  7.6*  HCT  25.1*  23.2*  PLT  270  264   Coag's No results found for this basename: APTT, INR,  in the last 72 hours  BMET Recent Labs     10/07/12  2126  10/08/12  0535  10/09/12  0400  NA  133*  132*  135  K  4.5  5.0  4.1  CL  103  102  103  CO2  17*  16*  16*  BUN  58*  59*  66*  CREATININE  1.92*  2.32*  2.99*  GLUCOSE  253*  297*  147*   Electrolytes Recent Labs     10/07/12  2119  10/07/12  2126  10/08/12  0535  10/09/12  0400  CALCIUM   --   9.3  8.9  8.9  MG  2.0   --    --    --    Sepsis Markers Recent Labs     10/08/12  1210  10/09/12  0400  PROCALCITON  2.65  3.92   ABG Recent  Labs     10/08/12  0456  10/08/12  0640  PHART  7.362  7.320*  PCO2ART  30.3*  31.9*  PO2ART  51.4*  169.0*   Liver Enzymes No results found for this basename: AST, ALT, ALKPHOS, BILITOT, ALBUMIN,  in the last 72 hours  Cardiac Enzymes Recent Labs     10/07/12  2126   10/08/12  1253  10/09/12  0150  10/09/12  0730  TROPONINI   --    < >  3.21*  1.65*  0.87*  PROBNP  1631.0*   --    --    --    --    < > = values in this interval not displayed.   Glucose Recent Labs     10/08/12  1149  10/08/12  1559  10/08/12  1942  10/08/12  2356  10/09/12  0339  10/09/12  0752  GLUCAP  210*  187*  151*  117*  134*  138*    Imaging Dg Chest 2 View  10/07/2012   CLINICAL DATA:  Chest pain. Shortness of breast.  EXAM: CHEST  2 VIEW  COMPARISON:  01/17/2012.  FINDINGS: Mild cardiopericardial enlargement. Perihilar vascular fullness with fissural thickening and probable Kerley lines in the lateral projection. There may be trace pleural effusions. No pneumothorax.  IMPRESSION: Cardiomegaly and pulmonary edema.   Electronically Signed   By: Tiburcio Pea   On: 10/07/2012  22:08   Dg Chest Port 1 View  10/09/2012   CLINICAL DATA:  Respiratory failure  EXAM: PORTABLE CHEST - 1 VIEW  COMPARISON:  10/08/2012  FINDINGS: Endotracheal tube has its tip 4.5 cm above the Carina. Nasogastric tube enters the stomach. Bilateral airspace density consistent with pulmonary edema persists, but is improved since yesterday. No worsening or new findings. No effusions.  IMPRESSION: Improvement in pulmonary edema pattern.   Electronically Signed   By: Paulina Fusi M.D.   On: 10/09/2012 07:40   Dg Chest Port 1 View  10/08/2012   *RADIOLOGY REPORT*  Clinical Data: Intubation  PORTABLE CHEST - 1 VIEW  Comparison: Prior radiograph from 10/07/2012  Findings: Endotracheal tube is in place with tip located 4.1 cm above the carina.  Enteric tube courses into the abdomen. Cardiomegaly is unchanged.  There has been interval worsening of diffuse pulmonary edema.  No pneumothorax.  No definite focal infiltrates are identified.  Osseous structures are unchanged.  IMPRESSION: 1.  Tip of endotracheal tube 4.1 cm above the carina. 2.  Interval worsening of pulmonary edema as compared to 10/07/2012.   Original Report Authenticated By: Rise Mu, M.D.    ASSESSMENT / PLAN:  PULMONARY A: Acute respiratory failure 2nd to acute pulmonary edema and ?pneumonia. Hx of OSA - non compliant with CPAP as outpt. P:   -pressure support wean as tolerated >> not ready for extubation -goal negative fluid balance -f/u CXR -will need PAP therapy after extubation  CARDIOVASCULAR A:  Acute on chronic diastolic CHF. NSTEMI. P:  -continue ASA, lipitor -lasix gtt, NTG gtt per cardiology -using DA to maintain hemodynamics while diuresing per cardiology >> will need CVL -heparin gtt per pharmacy  RENAL A:   Stage 4 CKD. P:   -monitor renal fx while getting lasix  GASTROINTESTINAL A:   Nutrition. P:   -tube feeds while on vent -protonix for SUP  HEMATOLOGIC A:   Anemia >> no obvious  bleeding. P:  -f/u CBC -transfuse for Hb < 7 in setting of possible ACS -continue FeSO4  INFECTIOUS A:   ?CAP - clinical picture more suggestive of pulmonary edema, but has mild elevation in procalcitonin. P:  -Day 2 rocephin, zithromax  ENDOCRINE A:   DM type II. P:   -SSI with lantus  NEUROLOGIC A:   Sedation. P:   -continuous sedation protocol >> goal RASS -1  Summary: Concern that ongoing cardiac issues limiting ability to tolerate vent weaning.  CC time 40 minutes.  Coralyn Helling, MD [facility] Pulmonary/Critical Care 10/09/2012, 9:52 AM Pager:  [phone] After 3pm call: [phone]

## 2012-10-09 NOTE — Progress Notes (Signed)
ETT secured. No break down noted moved ETT.

## 2012-10-09 NOTE — Progress Notes (Signed)
ANTICOAGULATION CONSULT NOTE - Follow Up Consult  Pharmacy Consult for heparin Indication: chest pain/ACS  Labs:  Recent Labs  10/07/12 2126  10/08/12 0535 10/08/12 1253 10/09/12 0150 10/09/12 0400  HGB 8.3*  --   --   --   --  7.6*  HCT 25.1*  --   --   --   --  23.2*  PLT 270  --   --   --   --  264  HEPARINUNFRC  --   --   --   --   --  0.34  CREATININE 1.92*  --  2.32*  --   --   --   TROPONINI  --   < > 0.40* 3.21* 1.65*  --   < > = values in this interval not displayed.   Assessment/Plan:  42yo female therapeutic on heparin with initial dosing for ACS.  Will continue gtt at current rate and confirm stable with additional level.  Vernard Gambles, PharmD, BCPS  10/09/2012,4:38 AM

## 2012-10-09 NOTE — Progress Notes (Signed)
eLink Physician-Brief Progress Note Patient Name: Jasmine [name] DOB: [birth year] MRN: [id number]  Date of Service  10/09/2012   HPI/Events of Note   Hgb 7.8  eICU Interventions  One unit prbc transfused    Intervention Category Intermediate Interventions: Bleeding - evaluation and treatment with blood products  Jasmine [name] 10/09/2012, 10:52 PM

## 2012-10-09 NOTE — Progress Notes (Signed)
INITIAL NUTRITION ASSESSMENT  DOCUMENTATION CODES Per approved criteria  -Morbid Obesity   INTERVENTION: 1. Initiate Vital HP @ 20 ml/hr via NG and increase by 10 ml every 4 hours to goal rate of 45 ml/hr. 30 ml Prostat QID.  At goal rate, tube feeding regimen will provide 1480 kcal, 155 grams of protein, and 902 ml of H2O.    2. RD will continue to follow  NUTRITION DIAGNOSIS: Inadequate oral intake related to inability to eat as evidenced by NPO diet/mechanical ventilation.   Goal: Enteral nutrition to provide 60-70% of estimated calorie needs (22-25 kcals/kg ideal body weight) and 100% of estimated protein needs, based on ASPEN guidelines for permissive underfeeding in critically ill obese individuals.   Monitor:  Vent status, TF initiation and tolerance, weight trends, labs   Reason for Assessment: VRDF/Consult   42 y.o. female  Admitting Dx: <principal problem not specified>  ASSESSMENT: Pt admitted with increased SOB and chest pain. Pt with hx of OSA, CKD, HTN, DM, and HF. With CHF exacerbation, volume overload. Continued with SOB on admission, did not tolerate Bipap, and required intubation on 9/14. Diuretics added, weight not down much and creatinine trending up. Failed weaning trial this morning.   RD consulted for initiation and management of enteral nutrition. Pt has NG tube with tip confirmed in stomach.   Height: Ht Readings from Last 1 Encounters:  10/08/12 5\' 6"  (1.676 m)    Weight: Wt Readings from Last 1 Encounters:  10/09/12 281 lb 8.4 oz (127.7 kg)    Ideal Body Weight: 130 lbs   % Ideal Body Weight: 215%  Wt Readings from Last 10 Encounters:  10/09/12 281 lb 8.4 oz (127.7 kg)  07/24/12 273 lb (123.832 kg)  03/30/12 278 lb 1.6 oz (126.145 kg)  02/23/12 276 lb (125.193 kg)  01/27/12 276 lb 6.4 oz (125.374 kg)  01/20/12 275 lb 9.2 oz (125 kg)  09/07/11 273 lb 4 oz (123.945 kg)  12/31/10 277 lb 6.4 oz (125.828 kg)  12/11/10 270 lb (122.471 kg)   11/20/10 265 lb 12.8 oz (120.566 kg)    Usual Body Weight: 273 lbs   % Usual Body Weight: 103%  BMI:  Body mass index is 45.46 kg/(m^2). Obesity class 3, extreme  Patient is currently intubated on ventilator support.  MV: 10.4 L/min Temp:Temp (24hrs), Avg:98.5 F (36.9 C), Min:97.4 F (36.3 C), Max:99 F (37.2 C)  Propofol: none   Estimated Nutritional Needs: Kcal: 2202 Underfeeding goal: 1321-1541 Protein: >/=148 gm  Fluid: 1.3-1.5 L   Skin: intact   Diet Order:   NPO  EDUCATION NEEDS: -No education needs identified at this time   Intake/Output Summary (Last 24 hours) at 10/09/12 0952 Last data filed at 10/09/12 0600  Gross per 24 hour  Intake 1478.5 ml  Output   2130 ml  Net -651.5 ml    Last BM: PTA    Labs:   Recent Labs Lab 10/07/12 2119 10/07/12 2126 10/08/12 0535 10/09/12 0400  NA  --  133* 132* 135  K  --  4.5 5.0 4.1  CL  --  103 102 103  CO2  --  17* 16* 16*  BUN  --  58* 59* 66*  CREATININE  --  1.92* 2.32* 2.99*  CALCIUM  --  9.3 8.9 8.9  MG 2.0  --   --   --   GLUCOSE  --  253* 297* 147*    CBG (last 3)   Recent Labs  10/08/12 2356  10/09/12 0339 10/09/12 0752  GLUCAP 117* 134* 138*    Scheduled Meds: . antiseptic oral rinse  15 mL Mouth Rinse QID  . aspirin  81 mg Oral Daily  . atorvastatin  40 mg Oral q1800  . azithromycin  500 mg Intravenous Q24H  . cefTRIAXone (ROCEPHIN)  IV  2 g Intravenous Q24H  . chlorhexidine  15 mL Mouth Rinse BID  . ferrous sulfate  325 mg Oral BID WC  . influenza vac split quadrivalent PF  0.5 mL Intramuscular Tomorrow-1000  . insulin aspart  0-15 Units Subcutaneous Q4H  . insulin glargine  10 Units Subcutaneous QHS  . pantoprazole (PROTONIX) IV  40 mg Intravenous Q24H  . sodium chloride  3 mL Intravenous Q12H    Continuous Infusions: . DOPamine    . fentaNYL infusion INTRAVENOUS 100 mcg/hr (10/09/12 0300)  . furosemide (LASIX) infusion 8 mg/hr (10/08/12 1424)  . heparin 1,300 Units/hr  (10/08/12 2000)  . midazolam (VERSED) infusion 2 mg/hr (10/09/12 0749)  . nitroGLYCERIN 5 mcg/min (10/08/12 2000)    Past Medical History  Diagnosis Date  . Diastolic heart failure     echo 11/03/10 EF 55-60%  . Hypertension   . Chronic kidney disease (CKD)     stage II-III  . Hyperlipidemia   . Uncontrolled diabetes mellitus   . Diabetic ketoacidosis     with seizures  . Anemia   . Diabetic gastroparesis   . Obesity   . Coronary artery disease     mild per cath 2010  . CKD (chronic kidney disease), stage IV 01/20/2012    Past Surgical History  Procedure Laterality Date  . Cholecystectomy    . Carpal tunnel release  2003    Isabell Jarvis RD, LDN Pager [phone] After Hours pager [phone]

## 2012-10-09 NOTE — Progress Notes (Signed)
Respiratory therapy note- Patient weaned on SBT well, then became very aggitated having to hold her in the bed. Dr. Craige Cotta at bedside, ABG obtained, placed back to full support, Fio2 100%.

## 2012-10-09 NOTE — Progress Notes (Signed)
EET secured, no break dwon, moved to center.

## 2012-10-09 NOTE — Progress Notes (Signed)
Patient ID: Jasmine [name], female   DOB: 06-Sep-1970, 42 y.o.   MRN: [id number]    SUBJECTIVE: Patient remains intubated and sedated.  Creatinine up to 2.99 today, baseline around 1.9.  Poor diuresis.   Marland Kitchen antiseptic oral rinse  15 mL Mouth Rinse QID  . aspirin  81 mg Oral Daily  . azithromycin  500 mg Intravenous Q24H  . cefTRIAXone (ROCEPHIN)  IV  2 g Intravenous Q24H  . chlorhexidine  15 mL Mouth Rinse BID  . ferrous sulfate  325 mg Oral BID WC  . influenza vac split quadrivalent PF  0.5 mL Intramuscular Tomorrow-1000  . insulin aspart  0-15 Units Subcutaneous Q4H  . insulin glargine  10 Units Subcutaneous QHS  . pantoprazole (PROTONIX) IV  40 mg Intravenous Q24H  . sodium chloride  3 mL Intravenous Q12H  heparin gtt NTG gtt @ 5    Filed Vitals:   10/09/12 0500 10/09/12 0600 10/09/12 0750 10/09/12 0855  BP:    143/60  Pulse: 99 101  113  Temp:   98.8 F (37.1 C)   TempSrc:   Oral   Resp: 22 23  24   Height:      Weight:      SpO2: 96% 97%  94%    Intake/Output Summary (Last 24 hours) at 10/09/12 0923 Last data filed at 10/09/12 0600  Gross per 24 hour  Intake 1478.5 ml  Output   2130 ml  Net -651.5 ml    LABS: Basic Metabolic Panel:  Recent Labs  40/98/11 2119  10/08/12 0535 10/09/12 0400  NA  --   < > 132* 135  K  --   < > 5.0 4.1  CL  --   < > 102 103  CO2  --   < > 16* 16*  GLUCOSE  --   < > 297* 147*  BUN  --   < > 59* 66*  CREATININE  --   < > 2.32* 2.99*  CALCIUM  --   < > 8.9 8.9  MG 2.0  --   --   --   < > = values in this interval not displayed. Liver Function Tests: No results found for this basename: AST, ALT, ALKPHOS, BILITOT, PROT, ALBUMIN,  in the last 72 hours No results found for this basename: LIPASE, AMYLASE,  in the last 72 hours CBC:  Recent Labs  10/07/12 2126 10/09/12 0400  WBC 13.9* 11.4*  NEUTROABS 11.5*  --   HGB 8.3* 7.6*  HCT 25.1* 23.2*  MCV 85.1 86.2  PLT 270 264   Cardiac Enzymes:  Recent Labs  10/08/12 1253  10/09/12 0150 10/09/12 0730  TROPONINI 3.21* 1.65* 0.87*   BNP: No components found with this basename: POCBNP,  D-Dimer:  Recent Labs  10/07/12 2126  DDIMER 1.28*   Hemoglobin A1C: No results found for this basename: HGBA1C,  in the last 72 hours Fasting Lipid Panel: No results found for this basename: CHOL, HDL, LDLCALC, TRIG, CHOLHDL, LDLDIRECT,  in the last 72 hours Thyroid Function Tests: No results found for this basename: TSH, T4TOTAL, FREET3, T3FREE, THYROIDAB,  in the last 72 hours Anemia Panel: No results found for this basename: VITAMINB12, FOLATE, FERRITIN, TIBC, IRON, RETICCTPCT,  in the last 72 hours  RADIOLOGY: Dg Chest 2 View  10/07/2012   CLINICAL DATA:  Chest pain. Shortness of breast.  EXAM: CHEST  2 VIEW  COMPARISON:  01/17/2012.  FINDINGS: Mild cardiopericardial enlargement. Perihilar vascular fullness with fissural  thickening and probable Kerley lines in the lateral projection. There may be trace pleural effusions. No pneumothorax.  IMPRESSION: Cardiomegaly and pulmonary edema.   Electronically Signed   By: Tiburcio Pea   On: 10/07/2012 22:08   Dg Chest Port 1 View  10/09/2012   CLINICAL DATA:  Respiratory failure  EXAM: PORTABLE CHEST - 1 VIEW  COMPARISON:  10/08/2012  FINDINGS: Endotracheal tube has its tip 4.5 cm above the Carina. Nasogastric tube enters the stomach. Bilateral airspace density consistent with pulmonary edema persists, but is improved since yesterday. No worsening or new findings. No effusions.  IMPRESSION: Improvement in pulmonary edema pattern.   Electronically Signed   By: Paulina Fusi M.D.   On: 10/09/2012 07:40   Dg Chest Port 1 View  10/08/2012   *RADIOLOGY REPORT*  Clinical Data: Intubation  PORTABLE CHEST - 1 VIEW  Comparison: Prior radiograph from 10/07/2012  Findings: Endotracheal tube is in place with tip located 4.1 cm above the carina.  Enteric tube courses into the abdomen. Cardiomegaly is unchanged.  There has been interval  worsening of diffuse pulmonary edema.  No pneumothorax.  No definite focal infiltrates are identified.  Osseous structures are unchanged.  IMPRESSION: 1.  Tip of endotracheal tube 4.1 cm above the carina. 2.  Interval worsening of pulmonary edema as compared to 10/07/2012.   Original Report Authenticated By: Rise Mu, M.D.    PHYSICAL EXAM General: NAD Neck: JVP 10 cm, no thyromegaly or thyroid nodule.  Lungs: Decreased breath sounds at bases. CV: Nondisplaced PMI.  Heart regular S1/S2, no S3/S4, no murmur.  No peripheral edema.  No carotid bruit.  Normal pedal pulses.  Abdomen: Soft, nontender, no hepatosplenomegaly, no distention.  Neurologic: Awake, intubated.  Extremities: No clubbing or cyanosis.   TELEMETRY: Reviewed telemetry pt in NSR  ASSESSMENT AND PLAN: 42 yo with history of severe OSA, diastolic CHF, CKD admitted with acute on chronic diastolic CHF and intubated for pulmonary edema. 1. CHF: Acute on chronic diastolic CHF, EF 78-29%.  Pulmonary edema on CXR, elevated JVP.  Minimal diuresis yesterday on Lasix gtt and creatinine up to 2.99.  - Will increase NTG gtt to 25 mcg/min - Start low dose dopamine gtt 2 mcg/kg/min.  - Increase Lasix gtt to 10 mg/hr - She will need central line (talked to CCM).  Will get CVP monitoring.  2. AKI on CKD: Creatinine up to 2.99, baseline around 1.9.  As above, still volume overloaded.  Will start low dose dopamine.  Follow BMET closely.  3. Elevated troponin: Peak at 3.21.  True NSTEMI with plaque rupture versus demand ischemia.  Apical wall motion abnormality.  She is on heparin gtt. Add ASA and statin.  Right now, will hold off on cath given rise in creatinine.  Troponin now coming down.   Marca Ancona 10/09/2012 9:30 AM

## 2012-10-09 NOTE — Progress Notes (Addendum)
Patient's O2 saturation drop to 87%. Patient was given 100% O2 and suctioned. Small amount of thick tan secretions and small mucous plug obtained.  Patient tolerated suction well. FiO2 increased to 70%. O2 saturation is now 95%. Dr. Craige Cotta informed at 1430.

## 2012-10-09 NOTE — Progress Notes (Signed)
Stacking breaths. Bolused with 50 mcg fentanyl and increased drip to 200 mcg.

## 2012-10-10 ENCOUNTER — Inpatient Hospital Stay (HOSPITAL_COMMUNITY): Payer: 59

## 2012-10-10 DIAGNOSIS — J189 Pneumonia, unspecified organism: Secondary | ICD-10-CM

## 2012-10-10 LAB — GLUCOSE, CAPILLARY
Glucose-Capillary: 248 mg/dL — ABNORMAL HIGH (ref 70–99)
Glucose-Capillary: 235 mg/dL — ABNORMAL HIGH (ref 70–99)

## 2012-10-10 LAB — BASIC METABOLIC PANEL
BUN: 76 mg/dL — ABNORMAL HIGH (ref 6–23)
BUN: 89 mg/dL — ABNORMAL HIGH (ref 6–23)
CO2: 15 mEq/L — ABNORMAL LOW (ref 19–32)
CO2: 19 mEq/L (ref 19–32)
Calcium: 8.6 mg/dL (ref 8.4–10.5)
Calcium: 8.8 mg/dL (ref 8.4–10.5)
Creatinine, Ser: 3.81 mg/dL — ABNORMAL HIGH (ref 0.50–1.10)
GFR calc non Af Amer: 14 mL/min — ABNORMAL LOW (ref >90)
GFR calc non Af Amer: 14 mL/min — ABNORMAL LOW (ref >90)
Glucose, Bld: 250 mg/dL — ABNORMAL HIGH (ref 70–99)
Glucose, Bld: 342 mg/dL — ABNORMAL HIGH (ref 70–99)
Potassium: 4.8 mEq/L (ref 3.5–5.1)

## 2012-10-10 LAB — CBC
Hemoglobin: 7.6 g/dL — ABNORMAL LOW (ref 12.0–15.0)
MCH: 28.1 pg (ref 26.0–34.0)
MCHC: 32.2 g/dL (ref 30.0–36.0)
MCV: 87.4 fL (ref 78.0–100.0)
RBC: 2.7 MIL/uL — ABNORMAL LOW (ref 3.87–5.11)

## 2012-10-10 LAB — POCT I-STAT 3, ART BLOOD GAS (G3+)
Acid-base deficit: 8 mmol/L — ABNORMAL HIGH (ref 0.0–2.0)
Acid-base deficit: 8 mmol/L — ABNORMAL HIGH (ref 0.0–2.0)
Acid-base deficit: 7 mmol/L — ABNORMAL HIGH (ref 0.0–2.0)
Bicarbonate: 16.2 mEq/L — ABNORMAL LOW (ref 20.0–24.0)
Bicarbonate: 18.3 mEq/L — ABNORMAL LOW (ref 20.0–24.0)
Bicarbonate: 18.6 mEq/L — ABNORMAL LOW (ref 20.0–24.0)
O2 Saturation: 90 %
O2 Saturation: 95 %
Patient temperature: 99
Patient temperature: 99.2
Patient temperature: 99.9
Patient temperature: 101.3
TCO2: 17 mmol/L (ref 0–100)
TCO2: 19 mmol/L (ref 0–100)
TCO2: 20 mmol/L (ref 0–100)
TCO2: 20 mmol/L (ref 0–100)
pCO2 arterial: 38.5 mmHg (ref 35.0–45.0)
pCO2 arterial: 42.1 mmHg (ref 35.0–45.0)
pH, Arterial: 7.233 — ABNORMAL LOW (ref 7.350–7.450)
pO2, Arterial: 68 mmHg — ABNORMAL LOW (ref 80.0–100.0)
pO2, Arterial: 60 mmHg — ABNORMAL LOW (ref 80.0–100.0)

## 2012-10-10 LAB — STREP PNEUMONIAE URINARY ANTIGEN: Strep Pneumo Urinary Antigen: NEGATIVE

## 2012-10-10 LAB — CARBOXYHEMOGLOBIN
O2 Saturation: 71.4 %
Total hemoglobin: 9.5 g/dL — ABNORMAL LOW (ref 12.0–16.0)

## 2012-10-10 LAB — HEMOGLOBIN AND HEMATOCRIT, BLOOD
HCT: 25.7 % — ABNORMAL LOW (ref 36.0–46.0)
Hemoglobin: 8.4 g/dL — ABNORMAL LOW (ref 12.0–15.0)

## 2012-10-10 LAB — CULTURE, RESPIRATORY W GRAM STAIN

## 2012-10-10 LAB — PREPARE RBC (CROSSMATCH)

## 2012-10-10 MED ORDER — FUROSEMIDE 10 MG/ML IJ SOLN
160.0000 mg | Freq: Four times a day (QID) | INTRAVENOUS | Status: DC
  Administered 2012-10-10 – 2012-10-12 (×6): 160 mg via INTRAVENOUS
  Filled 2012-10-10 (×8): qty 16

## 2012-10-10 MED ORDER — SODIUM BICARBONATE 8.4 % IV SOLN
INTRAVENOUS | Status: DC
  Administered 2012-10-10: 08:00:00 via INTRAVENOUS
  Filled 2012-10-10 (×2): qty 150

## 2012-10-10 MED ORDER — SODIUM CHLORIDE 0.9 % IJ SOLN
10.0000 mL | Freq: Two times a day (BID) | INTRAMUSCULAR | Status: DC
  Administered 2012-10-10 – 2012-10-14 (×7): 10 mL
  Administered 2012-10-14: 3 mL
  Administered 2012-10-15: 30 mL
  Administered 2012-10-15 – 2012-10-17 (×5): 10 mL
  Administered 2012-10-18: 10:00:00 3 mL
  Administered 2012-10-19 – 2012-10-20 (×3): 10 mL

## 2012-10-10 MED ORDER — INSULIN GLARGINE 100 UNIT/ML ~~LOC~~ SOLN
15.0000 [IU] | Freq: Every day | SUBCUTANEOUS | Status: DC
  Administered 2012-10-10: 15 [IU] via SUBCUTANEOUS
  Filled 2012-10-10: qty 0.15

## 2012-10-10 MED ORDER — HEPARIN SODIUM (PORCINE) 5000 UNIT/ML IJ SOLN
5000.0000 [IU] | Freq: Three times a day (TID) | INTRAMUSCULAR | Status: DC
  Administered 2012-10-10 – 2012-10-22 (×37): 5000 [IU] via SUBCUTANEOUS
  Filled 2012-10-10 (×40): qty 1

## 2012-10-10 MED ORDER — INSULIN ASPART 100 UNIT/ML ~~LOC~~ SOLN: 0.0000 [IU] | SUBCUTANEOUS | Status: DC

## 2012-10-10 MED ORDER — METOCLOPRAMIDE HCL 5 MG/ML IJ SOLN
5.0000 mg | Freq: Three times a day (TID) | INTRAMUSCULAR | Status: DC
  Administered 2012-10-10 – 2012-10-13 (×12): 5 mg via INTRAVENOUS
  Administered 2012-10-14: 14:00:00 via INTRAVENOUS
  Administered 2012-10-14 – 2012-10-16 (×6): 5 mg via INTRAVENOUS
  Filled 2012-10-10 (×22): qty 1

## 2012-10-10 MED ORDER — DARBEPOETIN ALFA-POLYSORBATE 200 MCG/0.4ML IJ SOLN
200.0000 ug | Freq: Once | INTRAMUSCULAR | Status: AC
  Administered 2012-10-10: 200 ug via SUBCUTANEOUS
  Filled 2012-10-10: qty 0.4

## 2012-10-10 MED ORDER — SODIUM CHLORIDE 0.9 % IJ SOLN: 10.0000 mL | INTRAMUSCULAR | Status: DC | PRN

## 2012-10-10 MED ORDER — INSULIN GLARGINE 100 UNIT/ML ~~LOC~~ SOLN: 10.0000 [IU] | Freq: Every day | SUBCUTANEOUS | Status: DC

## 2012-10-10 MED ORDER — METOLAZONE 2.5 MG PO TABS
2.5000 mg | ORAL_TABLET | Freq: Once | ORAL | Status: AC
  Administered 2012-10-10: 2.5 mg via ORAL
  Filled 2012-10-10 (×2): qty 1

## 2012-10-10 NOTE — Consult Note (Signed)
Reason for Consult:CKD, CHF Referring Physician: Dr. Shirlee Latch  Jasmine [name] is an 42 y.o. female.  HPI: 42yr old female with CKD 4 at baseline with Cr of 2.5-3 at baseline admitted with HTN, CP and vol xs on 9/13.  Required entub on 9/14 for hypoxemia, felt to be CHF. Also anemic, untx, mild MA,  On admit.  I saw her 12/13 with admit for CHF also.  DM poorly controlled x 20 yr, HTn, EF 55-60%, HPTH, OSA with CPAP hx, DM retinopathy, massive obesity.  Husband says she saw Dr. Abel Presto in 1/14 but has not returned.  Has missed long periods in our office. CR 1.9 on admit (diluted), and has risen to 3.8.  Given low doses of Furosemide and gaining wgt with minimal diuresis. Review of systems not obtained due to patient factors.    Past Medical History  Diagnosis Date  . Diastolic heart failure     echo 11/03/10 EF 55-60%  . Hypertension   . Chronic kidney disease (CKD)     stage II-III  . Hyperlipidemia   . Uncontrolled diabetes mellitus   . Diabetic ketoacidosis     with seizures  . Anemia   . Diabetic gastroparesis   . Obesity   . Coronary artery disease     mild per cath 2010  . CKD (chronic kidney disease), stage IV 01/20/2012    Past Surgical History  Procedure Laterality Date  . Cholecystectomy    . Carpal tunnel release  2003    Family History  Problem Relation Age of Onset  . Heart attack Mother   . Dementia Father     Social History:  reports that she has never smoked. She has never used smokeless tobacco. She reports that  drinks alcohol. She reports that she does not use illicit drugs.  Allergies:  Allergies  Allergen Reactions  . Contrast Media [Iodinated Diagnostic Agents] Nausea And Vomiting  . Paroxetine Nausea And Vomiting  . Oxycodone Itching and Rash    Medications:  I have reviewed the patient's current medications. Prior to Admission:  Prescriptions prior to admission  Medication Sig Dispense Refill  . amLODipine (NORVASC) 10 MG tablet Take 1  tablet (10 mg total) by mouth daily.  30 tablet  6  . aspirin 81 MG tablet Take 81 mg by mouth daily as needed. For pain      . ferrous sulfate 325 (65 FE) MG tablet Take 325 mg by mouth 2 (two) times daily.        . furosemide (LASIX) 80 MG tablet Take 2 tablets (160 mg total) by mouth 2 (two) times daily.  120 tablet  6  . Hydrocodone-Acetaminophen 5-300 MG TABS Take 1 tablet by mouth 3 (three) times daily as needed. For pain      . Insulin Aspart Prot & Aspart (NOVOLOG MIX 70/30 FLEXPEN) (70-30) 100 UNIT/ML SUPN Inject 30-40 Units into the skin 2 (two) times daily with a meal. Take 40 units in the am & 30 units in the pm      . labetalol (NORMODYNE) 300 MG tablet Take 300 mg by mouth 2 (two) times daily.        . ramipril (ALTACE) 10 MG tablet Take 10 mg by mouth daily.       Marland Kitchen FREESTYLE LITE test strip Twice daily before meals.      . Lancets (FREESTYLE) lancets          Results for orders placed during the hospital encounter of  10/07/12 (from the past 48 hour(s))  GLUCOSE, CAPILLARY     Status: Abnormal   Collection Time    10/08/12  7:42 PM      Result Value Range   Glucose-Capillary 151 (*) 70 - 99 mg/dL   Comment 1 Documented in Chart     Comment 2 Notify RN    GLUCOSE, CAPILLARY     Status: Abnormal   Collection Time    10/08/12 11:56 PM      Result Value Range   Glucose-Capillary 117 (*) 70 - 99 mg/dL   Comment 1 Documented in Chart     Comment 2 Notify RN    TROPONIN I     Status: Abnormal   Collection Time    10/09/12  1:50 AM      Result Value Range   Troponin I 1.65 (*) <0.30 ng/mL   Comment:            Due to the release kinetics of cTnI,     a negative result within the first hours     of the onset of symptoms does not rule out     myocardial infarction with certainty.     If myocardial infarction is still suspected,     repeat the test at appropriate intervals.     REPEATED TO VERIFY     CRITICAL VALUE NOTED.  VALUE IS CONSISTENT WITH PREVIOUSLY REPORTED AND  CALLED VALUE.  GLUCOSE, CAPILLARY     Status: Abnormal   Collection Time    10/09/12  3:39 AM      Result Value Range   Glucose-Capillary 134 (*) 70 - 99 mg/dL   Comment 1 Documented in Chart     Comment 2 Notify RN    BASIC METABOLIC PANEL     Status: Abnormal   Collection Time    10/09/12  4:00 AM      Result Value Range   Sodium 135  135 - 145 mEq/L   Potassium 4.1  3.5 - 5.1 mEq/L   Chloride 103  96 - 112 mEq/L   CO2 16 (*) 19 - 32 mEq/L   Glucose, Bld 147 (*) 70 - 99 mg/dL   BUN 66 (*) 6 - 23 mg/dL   Creatinine, Ser 1.61 (*) 0.50 - 1.10 mg/dL   Calcium 8.9  8.4 - 09.6 mg/dL   GFR calc non Af Amer 18 (*) >90 mL/min   GFR calc Af Amer 21 (*) >90 mL/min   Comment: (NOTE)     The eGFR has been calculated using the CKD EPI equation.     This calculation has not been validated in all clinical situations.     eGFR's persistently <90 mL/min signify possible Chronic Kidney     Disease.  CBC     Status: Abnormal   Collection Time    10/09/12  4:00 AM      Result Value Range   WBC 11.4 (*) 4.0 - 10.5 K/uL   RBC 2.69 (*) 3.87 - 5.11 MIL/uL   Hemoglobin 7.6 (*) 12.0 - 15.0 g/dL   HCT 04.5 (*) 40.9 - 81.1 %   MCV 86.2  78.0 - 100.0 fL   MCH 28.3  26.0 - 34.0 pg   MCHC 32.8  30.0 - 36.0 g/dL   RDW 91.4  78.2 - 95.6 %   Platelets 264  150 - 400 K/uL  HEPARIN LEVEL (UNFRACTIONATED)     Status: None   Collection Time    10/09/12  4:00 AM      Result Value Range   Heparin Unfractionated 0.34  0.30 - 0.70 IU/mL   Comment:            IF HEPARIN RESULTS ARE BELOW     EXPECTED VALUES, AND PATIENT     DOSAGE HAS BEEN CONFIRMED,     SUGGEST FOLLOW UP TESTING     OF ANTITHROMBIN III LEVELS.  PROCALCITONIN     Status: None   Collection Time    10/09/12  4:00 AM      Result Value Range   Procalcitonin 3.92     Comment:            Interpretation:     PCT > 2 ng/mL:     Systemic infection (sepsis) is likely,     unless other causes are known.     (NOTE)             ICU PCT  Algorithm               Non ICU PCT Algorithm        ----------------------------     ------------------------------             PCT < 0.25 ng/mL                 PCT < 0.1 ng/mL         Stopping of antibiotics            Stopping of antibiotics           strongly encouraged.               strongly encouraged.        ----------------------------     ------------------------------           PCT level decrease by               PCT < 0.25 ng/mL           >= 80% from peak PCT           OR PCT 0.25 - 0.5 ng/mL          Stopping of antibiotics                                                 encouraged.         Stopping of antibiotics               encouraged.        ----------------------------     ------------------------------           PCT level decrease by              PCT >= 0.25 ng/mL           < 80% from peak PCT            AND PCT >= 0.5 ng/mL            Continuing antibiotics                                                  encouraged.           Continuing antibiotics  encouraged.        ----------------------------     ------------------------------         PCT level increase compared          PCT > 0.5 ng/mL             with peak PCT AND              PCT >= 0.5 ng/mL             Escalation of antibiotics                                              strongly encouraged.          Escalation of antibiotics            strongly encouraged.  TROPONIN I     Status: Abnormal   Collection Time    10/09/12  7:30 AM      Result Value Range   Troponin I 0.87 (*) <0.30 ng/mL   Comment:            Due to the release kinetics of cTnI,     a negative result within the first hours     of the onset of symptoms does not rule out     myocardial infarction with certainty.     If myocardial infarction is still suspected,     repeat the test at appropriate intervals.     REPEATED TO VERIFY     CRITICAL VALUE NOTED.  VALUE IS CONSISTENT WITH PREVIOUSLY REPORTED AND CALLED VALUE.   GLUCOSE, CAPILLARY     Status: Abnormal   Collection Time    10/09/12  7:52 AM      Result Value Range   Glucose-Capillary 138 (*) 70 - 99 mg/dL  POCT I-STAT 3, BLOOD GAS (G3+)     Status: Abnormal   Collection Time    10/09/12  9:38 AM      Result Value Range   pH, Arterial 7.145 (*) 7.350 - 7.450   pCO2 arterial 45.8 (*) 35.0 - 45.0 mmHg   pO2, Arterial 51.0 (*) 80.0 - 100.0 mmHg   Bicarbonate 15.7 (*) 20.0 - 24.0 mEq/L   TCO2 17  0 - 100 mmol/L   O2 Saturation 74.0     Acid-base deficit 12.0 (*) 0.0 - 2.0 mmol/L   Patient temperature 98.8 F     Collection site RADIAL, ALLEN'S TEST ACCEPTABLE     Sample type ARTERIAL     Comment NOTIFIED PHYSICIAN    GLUCOSE, CAPILLARY     Status: Abnormal   Collection Time    10/09/12 11:46 AM      Result Value Range   Glucose-Capillary 167 (*) 70 - 99 mg/dL  TROPONIN I     Status: Abnormal   Collection Time    10/09/12  1:31 PM      Result Value Range   Troponin I 0.71 (*) <0.30 ng/mL   Comment:            Due to the release kinetics of cTnI,     a negative result within the first hours     of the onset of symptoms does not rule out     myocardial infarction with certainty.     If myocardial infarction is still suspected,     repeat the test  at appropriate intervals.     REPEATED TO VERIFY     CRITICAL VALUE NOTED.  VALUE IS CONSISTENT WITH PREVIOUSLY REPORTED AND CALLED VALUE.  HEMOGLOBIN AND HEMATOCRIT, BLOOD     Status: Abnormal   Collection Time    10/09/12  4:00 PM      Result Value Range   Hemoglobin 7.8 (*) 12.0 - 15.0 g/dL   HCT 40.9 (*) 81.1 - 91.4 %  GLUCOSE, CAPILLARY     Status: Abnormal   Collection Time    10/09/12  4:06 PM      Result Value Range   Glucose-Capillary 180 (*) 70 - 99 mg/dL  GLUCOSE, CAPILLARY     Status: Abnormal   Collection Time    10/09/12  7:53 PM      Result Value Range   Glucose-Capillary 152 (*) 70 - 99 mg/dL  POCT I-STAT 3, BLOOD GAS (G3+)     Status: Abnormal   Collection Time     10/09/12  8:55 PM      Result Value Range   pH, Arterial 7.254 (*) 7.350 - 7.450   pCO2 arterial 36.7  35.0 - 45.0 mmHg   pO2, Arterial 58.0 (*) 80.0 - 100.0 mmHg   Bicarbonate 16.1 (*) 20.0 - 24.0 mEq/L   TCO2 17  0 - 100 mmol/L   O2 Saturation 84.0     Acid-base deficit 10.0 (*) 0.0 - 2.0 mmol/L   Patient temperature 99.6 F     Sample type ARTERIAL    TYPE AND SCREEN     Status: None   Collection Time    10/09/12 11:25 PM      Result Value Range   ABO/RH(D) A POS     Antibody Screen NEG     Sample Expiration 10/12/2012     Unit Number [id number]     Blood Component Type RED CELLS,LR     Unit division 00     Status of Unit ISSUED     Transfusion Status OK TO TRANSFUSE     Crossmatch Result Compatible     Unit Number [id number]     Blood Component Type RED CELLS,LR     Unit division 00     Status of Unit ISSUED     Transfusion Status OK TO TRANSFUSE     Crossmatch Result Compatible    ABO/RH     Status: None   Collection Time    10/09/12 11:25 PM      Result Value Range   ABO/RH(D) A POS    PREPARE RBC (CROSSMATCH)     Status: None   Collection Time    10/09/12 11:30 PM      Result Value Range   Order Confirmation ORDER PROCESSED BY BLOOD BANK    GLUCOSE, CAPILLARY     Status: Abnormal   Collection Time    10/09/12 11:31 PM      Result Value Range   Glucose-Capillary 184 (*) 70 - 99 mg/dL  GLUCOSE, CAPILLARY     Status: Abnormal   Collection Time    10/10/12  4:19 AM      Result Value Range   Glucose-Capillary 220 (*) 70 - 99 mg/dL  HEPARIN LEVEL (UNFRACTIONATED)     Status: Abnormal   Collection Time    10/10/12  5:00 AM      Result Value Range   Heparin Unfractionated <0.10 (*) 0.30 - 0.70 IU/mL   Comment:  IF HEPARIN RESULTS ARE BELOW     EXPECTED VALUES, AND PATIENT     DOSAGE HAS BEEN CONFIRMED,     SUGGEST FOLLOW UP TESTING     OF ANTITHROMBIN III LEVELS.  BASIC METABOLIC PANEL     Status: Abnormal   Collection Time    10/10/12   5:00 AM      Result Value Range   Sodium 134 (*) 135 - 145 mEq/L   Potassium 4.8  3.5 - 5.1 mEq/L   Chloride 101  96 - 112 mEq/L   CO2 15 (*) 19 - 32 mEq/L   Glucose, Bld 250 (*) 70 - 99 mg/dL   BUN 76 (*) 6 - 23 mg/dL   Creatinine, Ser 4.54 (*) 0.50 - 1.10 mg/dL   Calcium 8.6  8.4 - 09.8 mg/dL   GFR calc non Af Amer 14 (*) >90 mL/min   GFR calc Af Amer 16 (*) >90 mL/min   Comment: (NOTE)     The eGFR has been calculated using the CKD EPI equation.     This calculation has not been validated in all clinical situations.     eGFR's persistently <90 mL/min signify possible Chronic Kidney     Disease.  CBC     Status: Abnormal   Collection Time    10/10/12  5:00 AM      Result Value Range   WBC 12.5 (*) 4.0 - 10.5 K/uL   RBC 2.70 (*) 3.87 - 5.11 MIL/uL   Hemoglobin 7.6 (*) 12.0 - 15.0 g/dL   HCT 11.9 (*) 14.7 - 82.9 %   MCV 87.4  78.0 - 100.0 fL   MCH 28.1  26.0 - 34.0 pg   MCHC 32.2  30.0 - 36.0 g/dL   RDW 56.2  13.0 - 86.5 %   Platelets 267  150 - 400 K/uL  PROCALCITONIN     Status: None   Collection Time    10/10/12  5:00 AM      Result Value Range   Procalcitonin 4.95     Comment:            Interpretation:     PCT > 2 ng/mL:     Systemic infection (sepsis) is likely,     unless other causes are known.     (NOTE)             ICU PCT Algorithm               Non ICU PCT Algorithm        ----------------------------     ------------------------------             PCT < 0.25 ng/mL                 PCT < 0.1 ng/mL         Stopping of antibiotics            Stopping of antibiotics           strongly encouraged.               strongly encouraged.        ----------------------------     ------------------------------           PCT level decrease by               PCT < 0.25 ng/mL           >= 80% from peak PCT  OR PCT 0.25 - 0.5 ng/mL          Stopping of antibiotics                                                 encouraged.         Stopping of antibiotics                encouraged.        ----------------------------     ------------------------------           PCT level decrease by              PCT >= 0.25 ng/mL           < 80% from peak PCT            AND PCT >= 0.5 ng/mL            Continuing antibiotics                                                  encouraged.           Continuing antibiotics                encouraged.        ----------------------------     ------------------------------         PCT level increase compared          PCT > 0.5 ng/mL             with peak PCT AND              PCT >= 0.5 ng/mL             Escalation of antibiotics                                              strongly encouraged.          Escalation of antibiotics            strongly encouraged.  POCT I-STAT 3, BLOOD GAS (G3+)     Status: Abnormal   Collection Time    10/10/12  5:32 AM      Result Value Range   pH, Arterial 7.233 (*) 7.350 - 7.450   pCO2 arterial 38.5  35.0 - 45.0 mmHg   pO2, Arterial 68.0 (*) 80.0 - 100.0 mmHg   Bicarbonate 16.2 (*) 20.0 - 24.0 mEq/L   TCO2 17  0 - 100 mmol/L   O2 Saturation 89.0     Acid-base deficit 10.0 (*) 0.0 - 2.0 mmol/L   Patient temperature 99.0 F     Sample type ARTERIAL    GLUCOSE, CAPILLARY     Status: Abnormal   Collection Time    10/10/12  7:47 AM      Result Value Range   Glucose-Capillary 248 (*) 70 - 99 mg/dL   Comment 1 Notify RN    PREPARE RBC (CROSSMATCH)     Status: None   Collection Time    10/10/12  8:05 AM  Result Value Range   Order Confirmation ORDER PROCESSED BY BLOOD BANK    CARBOXYHEMOGLOBIN     Status: Abnormal   Collection Time    10/10/12  9:30 AM      Result Value Range   Total hemoglobin 9.5 (*) 12.0 - 16.0 g/dL   O2 Saturation 16.1     Carboxyhemoglobin 2.1 (*) 0.5 - 1.5 %   Methemoglobin 1.4  0.0 - 1.5 %  STREP PNEUMONIAE URINARY ANTIGEN     Status: None   Collection Time    10/10/12 11:28 AM      Result Value Range   Strep Pneumo Urinary Antigen NEGATIVE  NEGATIVE    Comment:            Infection due to S. pneumoniae     cannot be absolutely ruled out     since the antigen present     may be below the detection limit     of the test.  GLUCOSE, CAPILLARY     Status: Abnormal   Collection Time    10/10/12 12:21 PM      Result Value Range   Glucose-Capillary 218 (*) 70 - 99 mg/dL   Comment 1 Notify RN    HEMOGLOBIN AND HEMATOCRIT, BLOOD     Status: Abnormal   Collection Time    10/10/12  3:30 PM      Result Value Range   Hemoglobin 8.4 (*) 12.0 - 15.0 g/dL   HCT 09.6 (*) 04.5 - 40.9 %  BASIC METABOLIC PANEL     Status: Abnormal   Collection Time    10/10/12  3:30 PM      Result Value Range   Sodium 134 (*) 135 - 145 mEq/L   Potassium 4.5  3.5 - 5.1 mEq/L   Chloride 100  96 - 112 mEq/L   CO2 19  19 - 32 mEq/L   Glucose, Bld 342 (*) 70 - 99 mg/dL   BUN 89 (*) 6 - 23 mg/dL   Creatinine, Ser 8.11 (*) 0.50 - 1.10 mg/dL   Calcium 8.8  8.4 - 91.4 mg/dL   GFR calc non Af Amer 14 (*) >90 mL/min   GFR calc Af Amer 16 (*) >90 mL/min   Comment: (NOTE)     The eGFR has been calculated using the CKD EPI equation.     This calculation has not been validated in all clinical situations.     eGFR's persistently <90 mL/min signify possible Chronic Kidney     Disease.  POCT I-STAT 3, BLOOD GAS (G3+)     Status: Abnormal   Collection Time    10/10/12  3:35 PM      Result Value Range   pH, Arterial 7.252 (*) 7.350 - 7.450   pCO2 arterial 41.6  35.0 - 45.0 mmHg   pO2, Arterial 60.0 (*) 80.0 - 100.0 mmHg   Bicarbonate 18.3 (*) 20.0 - 24.0 mEq/L   TCO2 19  0 - 100 mmol/L   O2 Saturation 85.0     Acid-base deficit 8.0 (*) 0.0 - 2.0 mmol/L   Patient temperature 99.2 F     Collection site ARTERIAL LINE     Drawn by Nurse     Sample type ARTERIAL    GLUCOSE, CAPILLARY     Status: Abnormal   Collection Time    10/10/12  3:46 PM      Result Value Range   Glucose-Capillary 282 (*) 70 - 99 mg/dL   Comment 1  Notify RN    POCT I-STAT 3, BLOOD GAS (G3+)      Status: Abnormal   Collection Time    10/10/12  5:35 PM      Result Value Range   pH, Arterial 7.258 (*) 7.350 - 7.450   pCO2 arterial 42.1  35.0 - 45.0 mmHg   pO2, Arterial 71.0 (*) 80.0 - 100.0 mmHg   Bicarbonate 18.6 (*) 20.0 - 24.0 mEq/L   TCO2 20  0 - 100 mmol/L   O2 Saturation 90.0     Acid-base deficit 8.0 (*) 0.0 - 2.0 mmol/L   Patient temperature 99.9 F     Collection site ARTERIAL LINE     Drawn by Nurse     Sample type ARTERIAL      Dg Chest Port 1 View  10/10/2012   CLINICAL DATA:  42 year old female with diastolic heart failure.  EXAM: PORTABLE CHEST - 1 VIEW  COMPARISON:  10/09/2012 and earlier.  FINDINGS: Portable AP semi upright view at 0609 hr. Stable endotracheal tube tip at the level the clavicles. Stable left IJ central line. Stable visible enteric tube.  Stable cardiac size and mediastinal contours. Increased retrocardiac opacity. Indistinctness of pulmonary vasculature and perihilar opacity continues. No large effusion. No pneumothorax.  IMPRESSION: 1. Stable lines and tubes.  2.  Increased left lower lobe collapse or consolidation.  3. Persistent evidence of pulmonary edema.   Electronically Signed   By: Augusto Gamble M.D.   On: 10/10/2012 08:22   Dg Chest Port 1 View  10/09/2012   *RADIOLOGY REPORT*  Clinical Data: Central line placement, ventilated  PORTABLE CHEST - 1 VIEW  Comparison: 10/09/2012  Findings: Left IJ central line tip terminates over the brachiocephalic/SVC junction.  No pneumothorax.  Endotracheal tube is appropriately positioned. Nasogastric tube terminates below the level of the diaphragms but the tip is not included on the film. Patchy pulmonary airspace opacities persist with mild cardiomegaly.  IMPRESSION: Left IJ central line tip over brachiocephalic/SVC junction.  No pneumothorax.   Original Report Authenticated By: Christiana Pellant, M.D.   Dg Chest Port 1 View  10/09/2012   CLINICAL DATA:  Respiratory failure  EXAM: PORTABLE CHEST - 1 VIEW   COMPARISON:  10/08/2012  FINDINGS: Endotracheal tube has its tip 4.5 cm above the Carina. Nasogastric tube enters the stomach. Bilateral airspace density consistent with pulmonary edema persists, but is improved since yesterday. No worsening or new findings. No effusions.  IMPRESSION: Improvement in pulmonary edema pattern.   Electronically Signed   By: Paulina Fusi M.D.   On: 10/09/2012 07:40    @ROS @ Blood pressure 92/47, pulse 115, temperature 99.9 F (37.7 C), temperature source Oral, resp. rate 22, height 5\' 6"  (1.676 m), weight 130.7 kg (288 lb 2.3 oz), last menstrual period 09/27/2012, SpO2 100.00%. @PHYSEXAMBYAGE2 @ Physical Examination: General appearance - overweight and sedated, on vent Mental status - uncooperative, on vent, sedated Eyes - pupils equal and reactive, extraocular eye movements intact, funduscopic exam abnormal DM retinopathy Mouth - mucous membranes moist, pharynx normal without lesions Neck - adenopathy noted PCL,  obese Lymphatics - posterior cervical nodes Chest - rales noted bilat, decreased air entry noted bilat Heart - S1 and S2 normal, rate 120, PMI 13 cm lat to MSL Abdomen - liver down 8 cm, pos bs soft, obese Musculoskeletal - no joint tenderness, deformity or swelling Extremities - pedal edema 3-4 + Skin - trophic changes distal  Assessment/Plan: 1 CKD 4 with mild exacerbation.  Fluid overload main complic,  needs adequate doses diuretics attempted and may need UF.  Anemic, acidemic and ^ PTH by hx.  Suspect near ESRD.  Cr diluted on admit and well below baseline.  Need to limit fluids, check UA. 2 Massive obesity 3 Hypertension: not an issue, ? Role of ACEI  In current situation 4. Anemia check and give IV if needed, start ESA 5. Metabolic Bone Disease: check PTH 6 VDRF vol, +? pneu P Lasix in full doses, stop IVF, epo, check Fe, PTH, UA  Trey Gulbranson L 10/10/2012, 6:21 PM

## 2012-10-10 NOTE — Progress Notes (Signed)
Pt's urine output has been decreasing throughout the day, and urine output for the last three hours was 60 cc. The last hour urine output was 10 cc. Foley catheter irrigated. Bladder scan didn't show any urine. Dr. Darrick Penna made aware. Will continue to monitor.  Alfonso Ellis, RN

## 2012-10-10 NOTE — Progress Notes (Signed)
At 1530 residuals from OG were 30mL. Tube feeding has been resumed ata rate of  22, half the original rate. Will continue to monitor residuals.

## 2012-10-10 NOTE — Progress Notes (Signed)
Pt's O2 sat 89-90% with FiO2 60%. After Fi02 increased to 70%, O2 sat 91-92%. ABG showed pO2 58 and PH 7.25. Results called to Dr. Delford Field. No changes made to vent settings. Will continue to monitor.  Alfonso Ellis, RN

## 2012-10-10 NOTE — Progress Notes (Signed)
Patient ID: Jasmine [name], female   DOB: [birth year], 42 y.o.   MRN: [id number]   SUBJECTIVE: Patient remains intubated and sedated.  Creatinine up to 3. today, baseline around 1.9.  Diuresis was minimal.  CVP 18-19 yesterday, 22 this morning.  CXR shows pulmonary edema.    Marland Kitchen antiseptic oral rinse  15 mL Mouth Rinse QID  . aspirin  81 mg Per Tube Daily  . atorvastatin  40 mg Per Tube q1800  . azithromycin  500 mg Intravenous Q24H  . cefTRIAXone (ROCEPHIN)  IV  2 g Intravenous Q24H  . chlorhexidine  15 mL Mouth Rinse BID  . feeding supplement  30 mL Per Tube QID  . ferrous sulfate  300 mg Per Tube BID  . insulin aspart  0-15 Units Subcutaneous Q4H  . insulin glargine  15 Units Subcutaneous QHS  . metoCLOPramide (REGLAN) injection  5 mg Intravenous Q8H  . pantoprazole sodium  40 mg Per Tube Q24H  . sodium chloride  10-40 mL Intracatheter Q12H  heparin gtt Dopamine @ 2 Versed/Fentanyl gtts    Filed Vitals:   10/10/12 0615 10/10/12 0630 10/10/12 0645 10/10/12 0749  BP:      Pulse: 99 98 97   Temp:    98.8 F (37.1 C)  TempSrc:    Oral  Resp: 18 20 17    Height:      Weight:      SpO2: 92% 96% 96%     Intake/Output Summary (Last 24 hours) at 10/10/12 0851 Last data filed at 10/10/12 0826  Gross per 24 hour  Intake 1975.28 ml  Output   1520 ml  Net 455.28 ml    LABS: Basic Metabolic Panel:  Recent Labs  47/42/59 2119  10/09/12 0400 10/10/12 0500  NA  --   < > 135 134*  K  --   < > 4.1 4.8  CL  --   < > 103 101  CO2  --   < > 16* 15*  GLUCOSE  --   < > 147* 250*  BUN  --   < > 66* 76*  CREATININE  --   < > 2.99* 3.80*  CALCIUM  --   < > 8.9 8.6  MG 2.0  --   --   --   < > = values in this interval not displayed. Liver Function Tests: No results found for this basename: AST, ALT, ALKPHOS, BILITOT, PROT, ALBUMIN,  in the last 72 hours No results found for this basename: LIPASE, AMYLASE,  in the last 72 hours CBC:  Recent Labs  10/07/12 2126 10/09/12 0400  10/09/12 1600 10/10/12 0500  WBC 13.9* 11.4*  --  12.5*  NEUTROABS 11.5*  --   --   --   HGB 8.3* 7.6* 7.8* 7.6*  HCT 25.1* 23.2* 23.9* 23.6*  MCV 85.1 86.2  --  87.4  PLT 270 264  --  267   Cardiac Enzymes:  Recent Labs  10/09/12 0150 10/09/12 0730 10/09/12 1331  TROPONINI 1.65* 0.87* 0.71*   BNP: No components found with this basename: POCBNP,  D-Dimer:  Recent Labs  10/07/12 2126  DDIMER 1.28*   Hemoglobin A1C: No results found for this basename: HGBA1C,  in the last 72 hours Fasting Lipid Panel: No results found for this basename: CHOL, HDL, LDLCALC, TRIG, CHOLHDL, LDLDIRECT,  in the last 72 hours Thyroid Function Tests: No results found for this basename: TSH, T4TOTAL, FREET3, T3FREE, THYROIDAB,  in the last 72 hours  Anemia Panel: No results found for this basename: VITAMINB12, FOLATE, FERRITIN, TIBC, IRON, RETICCTPCT,  in the last 72 hours  RADIOLOGY: Dg Chest 2 View  10/07/2012   CLINICAL DATA:  Chest pain. Shortness of breast.  EXAM: CHEST  2 VIEW  COMPARISON:  01/17/2012.  FINDINGS: Mild cardiopericardial enlargement. Perihilar vascular fullness with fissural thickening and probable Kerley lines in the lateral projection. There may be trace pleural effusions. No pneumothorax.  IMPRESSION: Cardiomegaly and pulmonary edema.   Electronically Signed   By: Tiburcio Pea   On: 10/07/2012 22:08   Dg Chest Port 1 View  10/09/2012   CLINICAL DATA:  Respiratory failure  EXAM: PORTABLE CHEST - 1 VIEW  COMPARISON:  10/08/2012  FINDINGS: Endotracheal tube has its tip 4.5 cm above the Carina. Nasogastric tube enters the stomach. Bilateral airspace density consistent with pulmonary edema persists, but is improved since yesterday. No worsening or new findings. No effusions.  IMPRESSION: Improvement in pulmonary edema pattern.   Electronically Signed   By: Paulina Fusi M.D.   On: 10/09/2012 07:40   Dg Chest Port 1 View  10/08/2012   *RADIOLOGY REPORT*  Clinical Data: Intubation   PORTABLE CHEST - 1 VIEW  Comparison: Prior radiograph from 10/07/2012  Findings: Endotracheal tube is in place with tip located 4.1 cm above the carina.  Enteric tube courses into the abdomen. Cardiomegaly is unchanged.  There has been interval worsening of diffuse pulmonary edema.  No pneumothorax.  No definite focal infiltrates are identified.  Osseous structures are unchanged.  IMPRESSION: 1.  Tip of endotracheal tube 4.1 cm above the carina. 2.  Interval worsening of pulmonary edema as compared to 10/07/2012.   Original Report Authenticated By: Rise Mu, M.D.    PHYSICAL EXAM General: NAD Neck: JVP 10 cm, no thyromegaly or thyroid nodule.  Lungs: Decreased breath sounds at bases. CV: Nondisplaced PMI.  Heart regular S1/S2, no S3/S4, no murmur.  No peripheral edema.  No carotid bruit.  Normal pedal pulses.  Abdomen: Soft, nontender, no hepatosplenomegaly, no distention.  Neurologic: Awake, intubated.  Extremities: No clubbing or cyanosis.   TELEMETRY: Reviewed telemetry pt in NSR  ASSESSMENT AND PLAN: 42 yo with history of severe OSA, diastolic CHF, CKD admitted with acute on chronic diastolic CHF and intubated for pulmonary edema. 1. CHF: Acute on chronic diastolic CHF, EF 84-13%.  Pulmonary edema on CXR, CVP 22.  Minimal diuresis yesterday on Lasix gtt and creatinine up to 3.8.  She is on dopamine @ 2.  She is still on low dose NTG gtt and SBP is in the 90s.  She has developed AKI, possibly mediated by hemodynamics => hypotension, cardiorenal picture.  She has prominent pulmonary edema and will need fluid off for extubation but will be limited by renal function at this time.  - Stop the NTG gtt and increase dopamine to 3 mcg/kg/min to promote higher BP/output. - Will check Coox this morning.  - Continue Lasix gtt at current dose and add metolazone 2.5 mg x 1.  If creatinine rises further/minimal urine output, agree with CCM that she will likely need CRRT for intractable volume  overload in setting of AKI. 2. AKI on CKD: Creatinine up to 3.8, baseline around 1.9.  Suspect hemodynamically mediated (cardiorenal syndrome, hypotension).  As above, still volume overloaded with CVP 22.  She is on dopamine, increased to 3.  Will add metolazone dose today, if no response/worsening renal function, will likely need CRRT. BMET this pm.  3. Elevated troponin: Peak  at 3.21.  True NSTEMI with plaque rupture versus demand ischemia.  Apical wall motion abnormality.  She is on ASA and statin.  Right now, will have to hold off on cath given rise in creatinine.  Stop heparin gtt today, start subcutaneous heparin.  4. Anemia: With acute ischemia, would keep hemoglobin >/=8.  She was transfused 1 unit last night with minimal rise in hemoglobin.  Will receive another transfusion today.  No obvious GI bleeding.  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  As above, will stop heparin with anemia.  5. Pulmonary: Definite pulmonary edema, possible CAP.  She is on ceftriaxone and azithromycin, cultures are negative so far and afebrile, WBCs mildly elevated.  Concerned that procalcitonin is high and rising however. ?Component of septic shock contributing to lower blood pressure.  Marca Ancona 10/10/2012 8:51 AM

## 2012-10-10 NOTE — Progress Notes (Signed)
eLink Physician-Brief Progress Note Patient Name: Jamani L Bribiesca DOB: 1970/07/22 MRN: [id number]  Date of Service  10/10/2012   HPI/Events of Note   Plat up, peak Concern autopeep  eICU Interventions  PCXR now Peep to 5, plat now improved, rate to 28, autopeep resolving abg   Intervention Category Major Interventions: Respiratory failure - evaluation and management  Shaely Gadberry J. 10/10/2012, 8:15 PM

## 2012-10-10 NOTE — Plan of Care (Signed)
Problem: Phase I Progression Outcomes Goal: Hemodynamically stable Outcome: Progressing On Dopamine at 3 mcg/kgmin and sbp 110-140. Goal: Patient tolerating nututrition at goal Outcome: Not Progressing Residuals > 600 earlier today even after tube feeding decreased by 1/2 and waiting 2 hours. Tube feeding held x 4 hours. Residuals down to 30 and restarted at 1/2 the original rate. Reglan started today. Goal: Patient tolerating weaning plan Outcome: Not Progressing Vent rate and peep increased today. FiO2 decreased.

## 2012-10-10 NOTE — Progress Notes (Signed)
At 0830 patient had a gastric residual of . Per Protocol residual was returned and tube feedings remained at 45 for 1 hour. At 0930 residual was rechecked and 530 mL was pulled. Per Protocol residual was returned and tube feeding was reduced by half, currently running at 22. Nurse will recheck residual in 2 hours.

## 2012-10-10 NOTE — Progress Notes (Signed)
PULMONARY  / CRITICAL CARE MEDICINE  Name: Jasmine [name] MRN: [id number] DOB: 1970/12/20    ADMISSION DATE:  10/07/2012 CONSULTATION DATE:  10/08/12  REFERRING MD :  Yaakov Guthrie, MD PRIMARY SERVICE: Cardiology  CHIEF COMPLAINT:  SOB  BRIEF PATIENT DESCRIPTION:  42 years female with diastolic heart failure, CKD, HTN, DM, OSA, admitted 9/13 with sudden onset of SOB and hypoxemia. Chest X ray consistent with acute pulmonary edema. Did not tolerate BiPAP and required endotracheal intubation.    SIGNIFICANT EVENTS: 9/15 Transfuse 1 unit PRBC 9/16  Add HCO3 gtt, Transfuse 1 unit PRBC  STUDIES:  Echo 9/14 >> mild LVH, EF 55 to 60%, grade 2 diastolic dysfx  LINES / TUBES: ETT 9/13>>> R Radial aline 9/13>>> Lt IJ CVL 9/15 >>>  CULTURES: Blood 9/14 >> Sputum 9/14 >>   ANTIBIOTICS: Rocephin 9/14>>> Azithro 9/14>>>  SUBJECTIVE:  Remains on increased FiO2.  VITAL SIGNS: Temp:  [98.5 F (36.9 C)-100.5 F (38.1 C)] 98.8 F (37.1 C) (09/16 0749) Pulse Rate:  [97-126] 97 (09/16 0645) Resp:  [14-29] 17 (09/16 0645) BP: (90-143)/(47-64) 92/47 mmHg (09/16 0437) SpO2:  [87 %-100 %] 96 % (09/16 0645) FiO2 (%):  [40 %-70 %] 70 % (09/16 0600) Weight:  [288 lb 2.3 oz (130.7 kg)] 288 lb 2.3 oz (130.7 kg) (09/16 0500) VENTILATOR SETTINGS: Vent Mode:  [-] PRVC FiO2 (%):  [40 %-70 %] 70 % Set Rate:  [16 bmp] 16 bmp Vt Set:  [470 mL] 470 mL PEEP:  [5 cmH20] 5 cmH20 Pressure Support:  [5 cmH20] 5 cmH20 Plateau Pressure:  [14 cmH20-25 cmH20] 24 cmH20 INTAKE / OUTPUT: Intake/Output     09/15 0701 - 09/16 0700 09/16 0701 - 09/17 0700   I.V. (mL/kg) 976.2 (7.5)    Blood 350    NG/GT 555    IV Piggyback     Total Intake(mL/kg) 1881.2 (14.4)    Urine (mL/kg/hr) 1850 (0.6)    Emesis/NG output     Total Output 1850     Net +31.2            PHYSICAL EXAMINATION: General: no distress HEENT: ETT in place Heart: Regular, no murmur Lungs: b/l rhonchi Abdomen: soft, non tender, +  bowel sounds. Musculoskeletal: 1+ edema Skin: No rashes  Neuro: RASS +1, follows commands, moves extremities  LABS:  CBC Recent Labs     10/07/12  2126  10/09/12  0400  10/09/12  1600  10/10/12  0500  WBC  13.9*  11.4*   --   12.5*  HGB  8.3*  7.6*  7.8*  7.6*  HCT  25.1*  23.2*  23.9*  23.6*  PLT  270  264   --   267   Coag's No results found for this basename: APTT, INR,  in the last 72 hours  BMET Recent Labs     10/08/12  0535  10/09/12  0400  10/10/12  0500  NA  132*  135  134*  K  5.0  4.1  4.8  CL  102  103  101  CO2  16*  16*  15*  BUN  59*  66*  76*  CREATININE  2.32*  2.99*  3.80*  GLUCOSE  297*  147*  250*   Electrolytes Recent Labs     10/07/12  2119   10/08/12  0535  10/09/12  0400  10/10/12  0500  CALCIUM   --    < >  8.9  8.9  8.6  MG  2.0   --    --    --    --    < > = values in this interval not displayed.   Sepsis Markers Recent Labs     10/08/12  1210  10/09/12  0400  10/10/12  0500  PROCALCITON  2.65  3.92  4.95   ABG Recent Labs     10/09/12  0938  10/09/12  2055  10/10/12  0532  PHART  7.145*  7.254*  7.233*  PCO2ART  45.8*  36.7  38.5  PO2ART  51.0*  58.0*  68.0*   Liver Enzymes No results found for this basename: AST, ALT, ALKPHOS, BILITOT, ALBUMIN,  in the last 72 hours  Cardiac Enzymes Recent Labs     10/07/12  2126   10/09/12  0150  10/09/12  0730  10/09/12  1331  TROPONINI   --    < >  1.65*  0.87*  0.71*  PROBNP  1631.0*   --    --    --    --    < > = values in this interval not displayed.   Glucose Recent Labs     10/09/12  0752  10/09/12  1146  10/09/12  1606  10/09/12  1953  10/09/12  2331  10/10/12  0419  GLUCAP  138*  167*  180*  152*  184*  220*    Imaging Dg Chest Port 1 View  10/09/2012   *RADIOLOGY REPORT*  Clinical Data: Central line placement, ventilated  PORTABLE CHEST - 1 VIEW  Comparison: 10/09/2012  Findings: Left IJ central line tip terminates over the brachiocephalic/SVC  junction.  No pneumothorax.  Endotracheal tube is appropriately positioned. Nasogastric tube terminates below the level of the diaphragms but the tip is not included on the film. Patchy pulmonary airspace opacities persist with mild cardiomegaly.  IMPRESSION: Left IJ central line tip over brachiocephalic/SVC junction.  No pneumothorax.   Original Report Authenticated By: Christiana Pellant, M.D.   Dg Chest Port 1 View  10/09/2012   CLINICAL DATA:  Respiratory failure  EXAM: PORTABLE CHEST - 1 VIEW  COMPARISON:  10/08/2012  FINDINGS: Endotracheal tube has its tip 4.5 cm above the Carina. Nasogastric tube enters the stomach. Bilateral airspace density consistent with pulmonary edema persists, but is improved since yesterday. No worsening or new findings. No effusions.  IMPRESSION: Improvement in pulmonary edema pattern.   Electronically Signed   By: Paulina Fusi M.D.   On: 10/09/2012 07:40    ASSESSMENT / PLAN:  PULMONARY A: Acute respiratory failure 2nd to acute pulmonary edema and pneumonia. Hx of OSA - non compliant with CPAP as outpt. P:   -continue full vent support >> not ready for extubation yet due to metabolic derangements, and concern for cardiac status -adjust PEEP, FiO2 to keep SpO2 > 92%, PaO2 > 60 mmHg -goal negative fluid balance -f/u CXR -will need PAP therapy after extubation  CARDIOVASCULAR A:  Acute on chronic diastolic CHF. NSTEMI. Hypotension >> likely from diuresis, anemia, and meds. P:  -continue ASA, lipitor -? D/c lasix gtt, NTG gtt defer to cardiology -using DA to maintain hemodynamics -heparin gtt per pharmacy  RENAL A:   Acute on chronic renal failure >> hypotension, hypoxia, anemia, and diuresis. Non gap metabolic acidosis. Stage 4 CKD. P:   -add HCO3 gtt  -monitor renal fx, urine outpt, electrolytes -concerned she may need CRRT >> defer to primary team to consult nephrology  GASTROINTESTINAL A:  Nutrition. DM gastroparesis. P:   -tube feeds while  on vent -add reglan 9/16 -protonix for SUP  HEMATOLOGIC A:   Anemia >> no obvious bleeding. P:  -f/u CBC -transfuse for Hb < 8 in setting of possible ACS -continue FeSO4  INFECTIOUS A:   CAP. P:  -Day 3 rocephin, zithromax  ENDOCRINE A:   DM type II with hyperglycemia. P:   -SSI with lantus  NEUROLOGIC A:   Sedation. P:   -continuous sedation protocol >> goal RASS -1 to -2  Summary: Concern that ongoing cardiac issues and metabolic derangements limiting ability to tolerate vent weaning.  May need assistance from nephrology with concern she may need CRRT.  CC time 40 minutes.  Coralyn Helling, MD [facility] Pulmonary/Critical Care 10/10/2012, 7:50 AM Pager:  [phone] After 3pm call: [phone]

## 2012-10-10 NOTE — Progress Notes (Signed)
ANTICOAGULATION CONSULT NOTE - Follow Up Consult  Pharmacy Consult for heparin Indication: chest pain/ACS  Labs:  Recent Labs  10/07/12 2126  10/08/12 0535  10/09/12 0150 10/09/12 0400 10/09/12 0730 10/09/12 1331 10/09/12 1600 10/10/12 0500  HGB 8.3*  --   --   --   --  7.6*  --   --  7.8* 7.6*  HCT 25.1*  --   --   --   --  23.2*  --   --  23.9* 23.6*  PLT 270  --   --   --   --  264  --   --   --  267  HEPARINUNFRC  --   --   --   --   --  0.34  --   --   --  <0.10*  CREATININE 1.92*  --  2.32*  --   --  2.99*  --   --   --  3.80*  TROPONINI  --   < > 0.40*  < > 1.65*  --  0.87* 0.71*  --   --   < > = values in this interval not displayed.   Assessment: 42yo female now undetectable on heparin after resumed ~12hr prior to lab without interruption at rate that was previously therapeutic.  Goal of Therapy:  Heparin level 0.3-0.7 units/ml   Plan:  Will increase heparin by 2-3 units/kg/hr to 1600 units/hr and check level in 8hr.  Vernard Gambles, PharmD, BCPS  10/10/2012,7:01 AM

## 2012-10-10 NOTE — Progress Notes (Signed)
At 1130 residuals were . Dr. Craige Cotta notified and placed verbal order to hold tube feedings for 4 hours and then recheck residuals. If residuals are less than 300, restart tube feeding at 22. If residuals are greater than 300 notify MD.

## 2012-10-11 ENCOUNTER — Inpatient Hospital Stay (HOSPITAL_COMMUNITY): Payer: 59

## 2012-10-11 LAB — COMPREHENSIVE METABOLIC PANEL
ALT: 11 U/L (ref 0–35)
AST: 12 U/L (ref 0–37)
Alkaline Phosphatase: 117 U/L (ref 39–117)
CO2: 19 mEq/L (ref 19–32)
Calcium: 8.9 mg/dL (ref 8.4–10.5)
Chloride: 101 mEq/L (ref 96–112)
GFR calc Af Amer: 18 mL/min — ABNORMAL LOW (ref >90)
GFR calc non Af Amer: 16 mL/min — ABNORMAL LOW (ref >90)
Glucose, Bld: 319 mg/dL — ABNORMAL HIGH (ref 70–99)
Potassium: 3.8 mEq/L (ref 3.5–5.1)
Sodium: 135 mEq/L (ref 135–145)

## 2012-10-11 LAB — CBC
Hemoglobin: 7.9 g/dL — ABNORMAL LOW (ref 12.0–15.0)
MCH: 28.9 pg (ref 26.0–34.0)
MCV: 85 fL (ref 78.0–100.0)
RBC: 2.73 MIL/uL — ABNORMAL LOW (ref 3.87–5.11)
WBC: 11.7 10*3/uL — ABNORMAL HIGH (ref 4.0–10.5)

## 2012-10-11 LAB — BLOOD GAS, ARTERIAL
Acid-base deficit: 6.7 mmol/L — ABNORMAL HIGH (ref 0.0–2.0)
Drawn by: 36496
FIO2: 0.5 %
MECHVT: 470 mL
Patient temperature: 98.6
RATE: 28 resp/min
TCO2: 18.6 mmol/L (ref 0–100)
pCO2 arterial: 31.6 mmHg — ABNORMAL LOW (ref 35.0–45.0)

## 2012-10-11 LAB — FERRITIN: Ferritin: 699 ng/mL — ABNORMAL HIGH (ref 10–291)

## 2012-10-11 LAB — TYPE AND SCREEN: Unit division: 0

## 2012-10-11 LAB — GLUCOSE, CAPILLARY
Glucose-Capillary: 288 mg/dL — ABNORMAL HIGH (ref 70–99)
Glucose-Capillary: 289 mg/dL — ABNORMAL HIGH (ref 70–99)
Glucose-Capillary: 130 mg/dL — ABNORMAL HIGH (ref 70–99)
Glucose-Capillary: 167 mg/dL — ABNORMAL HIGH (ref 70–99)
Glucose-Capillary: 158 mg/dL — ABNORMAL HIGH (ref 70–99)
Glucose-Capillary: 155 mg/dL — ABNORMAL HIGH (ref 70–99)

## 2012-10-11 LAB — RETICULOCYTES
RBC.: 2.73 MIL/uL — ABNORMAL LOW (ref 3.87–5.11)
Retic Ct Pct: 2.1 % (ref 0.4–3.1)

## 2012-10-11 LAB — PARATHYROID HORMONE, INTACT (NO CA): PTH: 288.7 pg/mL — ABNORMAL HIGH (ref 14.0–72.0)

## 2012-10-11 LAB — PHOSPHORUS: Phosphorus: 5.1 mg/dL — ABNORMAL HIGH (ref 2.3–4.6)

## 2012-10-11 LAB — IRON AND TIBC
Saturation Ratios: 9 % — ABNORMAL LOW (ref 20–55)
TIBC: 188 ug/dL — ABNORMAL LOW (ref 250–470)
TIBC: 163 ug/dL — ABNORMAL LOW (ref 250–470)
UIBC: 148 ug/dL (ref 125–400)

## 2012-10-11 LAB — LEGIONELLA ANTIGEN, URINE: Legionella Antigen, Urine: NEGATIVE

## 2012-10-11 LAB — VITAMIN B12: Vitamin B-12: 474 pg/mL (ref 211–911)

## 2012-10-11 LAB — HEPATITIS B SURFACE ANTIGEN: Hepatitis B Surface Ag: NEGATIVE

## 2012-10-11 MED ORDER — INSULIN ASPART 100 UNIT/ML ~~LOC~~ SOLN: 1.0000 [IU] | SUBCUTANEOUS | Status: DC

## 2012-10-11 MED ORDER — METOLAZONE 5 MG PO TABS
5.0000 mg | ORAL_TABLET | Freq: Once | ORAL | Status: AC
  Administered 2012-10-11: 5 mg via ORAL
  Filled 2012-10-11: qty 1

## 2012-10-11 MED ORDER — SODIUM CHLORIDE 0.9 % IV SOLN
1020.0000 mg | Freq: Once | INTRAVENOUS | Status: AC
  Administered 2012-10-11: 1020 mg via INTRAVENOUS
  Filled 2012-10-11: qty 34

## 2012-10-11 MED ORDER — DARBEPOETIN ALFA-POLYSORBATE 100 MCG/0.5ML IJ SOLN
100.0000 ug | INTRAMUSCULAR | Status: DC
  Administered 2012-10-18: 100 ug via SUBCUTANEOUS
  Filled 2012-10-11 (×2): qty 0.5

## 2012-10-11 MED ORDER — SODIUM CHLORIDE 0.9 % IV SOLN
INTRAVENOUS | Status: DC
  Administered 2012-10-11: 18:00:00 via INTRAVENOUS
  Administered 2012-10-12: 20 mL/h via INTRAVENOUS

## 2012-10-11 MED ORDER — ATORVASTATIN CALCIUM 40 MG PO TABS
40.0000 mg | ORAL_TABLET | Freq: Every day | ORAL | Status: DC
  Administered 2012-10-11: 40 mg
  Filled 2012-10-11 (×2): qty 1

## 2012-10-11 MED ORDER — INSULIN REGULAR HUMAN 100 UNIT/ML IJ SOLN
INTRAMUSCULAR | Status: DC
  Administered 2012-10-11 (×2): 2.1 [IU]/h via INTRAVENOUS
  Administered 2012-10-12: 18:00:00 via INTRAVENOUS
  Filled 2012-10-11 (×3): qty 1

## 2012-10-11 NOTE — Progress Notes (Signed)
Patient ID: Jasmine [name], female   DOB: 01/12/1971, 42 y.o.   MRN: [id number]  SUBJECTIVE: Patient remains intubated and sedated.  Creatinine 3.4 today, down from peak of 3.8, baseline around 1.9.  Some urine output but not vigorous with high dose Lasix.  I/Os remain even, CVP remains 20 this morning.   Marland Kitchen antiseptic oral rinse  15 mL Mouth Rinse QID  . aspirin  81 mg Per Tube Daily  . atorvastatin  40 mg Per Tube q1800  . azithromycin  500 mg Intravenous Q24H  . cefTRIAXone (ROCEPHIN)  IV  2 g Intravenous Q24H  . chlorhexidine  15 mL Mouth Rinse BID  . feeding supplement  30 mL Per Tube QID  . furosemide  160 mg Intravenous Q6H  . heparin subcutaneous  5,000 Units Subcutaneous Q8H  . insulin aspart  1-3 Units Subcutaneous Q4H  . metoCLOPramide (REGLAN) injection  5 mg Intravenous Q8H  . metolazone  5 mg Oral Once  . pantoprazole sodium  40 mg Per Tube Q24H  . sodium chloride  10 mL Intracatheter Q12H  Dopamine @ 3 Versed/Fentanyl gtts    Filed Vitals:   10/11/12 0600 10/11/12 0700 10/11/12 0800 10/11/12 0806  BP: 120/58 115/58 115/60 132/54  Pulse: 93 91 89 89  Temp:    97.4 F (36.3 C)  TempSrc:    Oral  Resp: 27 25 16 16   Height:      Weight:      SpO2: 100% 100% 94% 98%    Intake/Output Summary (Last 24 hours) at 10/11/12 0856 Last data filed at 10/11/12 0800  Gross per 24 hour  Intake 2860.04 ml  Output   2445 ml  Net 415.04 ml    LABS: Basic Metabolic Panel:  Recent Labs  84/69/62 1530 10/11/12 0400  NA 134* 135  K 4.5 3.8  CL 100 101  CO2 19 19  GLUCOSE 342* 319*  BUN 89* 97*  CREATININE 3.81* 3.41*  CALCIUM 8.8 8.9  PHOS  --  5.1*   Liver Function Tests:  Recent Labs  10/11/12 0400  AST 12  ALT 11  ALKPHOS 117  BILITOT 0.2*  PROT 6.9  ALBUMIN 2.7*   No results found for this basename: LIPASE, AMYLASE,  in the last 72 hours CBC:  Recent Labs  10/10/12 0500 10/10/12 1530 10/11/12 0400  WBC 12.5*  --  11.7*  HGB 7.6* 8.4* 7.9*   HCT 23.6* 25.7* 23.2*  MCV 87.4  --  85.0  PLT 267  --  257   Cardiac Enzymes:  Recent Labs  10/09/12 0150 10/09/12 0730 10/09/12 1331  TROPONINI 1.65* 0.87* 0.71*   BNP: No components found with this basename: POCBNP,  D-Dimer: No results found for this basename: DDIMER,  in the last 72 hours Hemoglobin A1C: No results found for this basename: HGBA1C,  in the last 72 hours Fasting Lipid Panel: No results found for this basename: CHOL, HDL, LDLCALC, TRIG, CHOLHDL, LDLDIRECT,  in the last 72 hours Thyroid Function Tests: No results found for this basename: TSH, T4TOTAL, FREET3, T3FREE, THYROIDAB,  in the last 72 hours Anemia Panel:  Recent Labs  10/10/12 1900 10/11/12 0400  TIBC 188*  --   IRON 49  --   RETICCTPCT  --  2.1    RADIOLOGY: Dg Chest 2 View  10/07/2012   CLINICAL DATA:  Chest pain. Shortness of breast.  EXAM: CHEST  2 VIEW  COMPARISON:  01/17/2012.  FINDINGS: Mild cardiopericardial enlargement. Perihilar vascular  fullness with fissural thickening and probable Kerley lines in the lateral projection. There may be trace pleural effusions. No pneumothorax.  IMPRESSION: Cardiomegaly and pulmonary edema.   Electronically Signed   By: Tiburcio Pea   On: 10/07/2012 22:08   Dg Chest Port 1 View  10/09/2012   CLINICAL DATA:  Respiratory failure  EXAM: PORTABLE CHEST - 1 VIEW  COMPARISON:  10/08/2012  FINDINGS: Endotracheal tube has its tip 4.5 cm above the Carina. Nasogastric tube enters the stomach. Bilateral airspace density consistent with pulmonary edema persists, but is improved since yesterday. No worsening or new findings. No effusions.  IMPRESSION: Improvement in pulmonary edema pattern.   Electronically Signed   By: Paulina Fusi M.D.   On: 10/09/2012 07:40   Dg Chest Port 1 View  10/08/2012   *RADIOLOGY REPORT*  Clinical Data: Intubation  PORTABLE CHEST - 1 VIEW  Comparison: Prior radiograph from 10/07/2012  Findings: Endotracheal tube is in place with tip  located 4.1 cm above the carina.  Enteric tube courses into the abdomen. Cardiomegaly is unchanged.  There has been interval worsening of diffuse pulmonary edema.  No pneumothorax.  No definite focal infiltrates are identified.  Osseous structures are unchanged.  IMPRESSION: 1.  Tip of endotracheal tube 4.1 cm above the carina. 2.  Interval worsening of pulmonary edema as compared to 10/07/2012.   Original Report Authenticated By: Rise Mu, M.D.    PHYSICAL EXAM General: intubated, sedated Neck: JVP 10 cm, no thyromegaly or thyroid nodule.  Lungs: Decreased breath sounds at bases. CV: Nondisplaced PMI.  Heart regular S1/S2, no S3/S4, no murmur.  No peripheral edema.  No carotid bruit.  Normal pedal pulses.  Abdomen: Soft, nontender, no hepatosplenomegaly, no distention.  Extremities: No clubbing or cyanosis.   TELEMETRY: Reviewed telemetry pt in NSR  ASSESSMENT AND PLAN: 42 yo with history of severe OSA, diastolic CHF, CKD admitted with acute on chronic diastolic CHF and intubated for pulmonary edema. 1. CHF: Acute on chronic diastolic CHF, EF 16-10%.  Pulmonary edema on CXR this morning (unchanged), CVP 20.  Diuresis not vigorous with Lasix 160 mg IV q6 hrs.  She is on dopamine @ 3.  She has developed AKI, possibly mediated by hemodynamics => hypotension, cardiorenal picture.  She has prominent pulmonary edema and will need fluid off for extubation but will be limited by renal function at this time.  - BP on the high side, would decrease dopamine back to 2 mcg/kg/min.  - Still not vigorous UOP with high dose Lasix.  Will add metolazone prior to the next Lasix dose.  For adequate fluid off she may require UF.  2. AKI on CKD: Creatinine up to 3.8 now back to 3.4, baseline around 1.9.  Suspect hemodynamically mediated (cardiorenal syndrome, hypotension).  As above, still volume overloaded with CVP 20.  She is on dopamine.  Will give metolazone with next Lasix dose.  As above, intractable  volume overload may require UF. 3. Elevated troponin: Peak at 3.21.  True NSTEMI with plaque rupture versus demand ischemia.  Apical wall motion abnormality.  She is on ASA and statin.  Right now, will have to hold off on cath given rise in creatinine.  Heparin gtt stopped yesterday.  4. Anemia: With acute ischemia, would keep hemoglobin >/=8.  No obvious GI bleeding.  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  Hemoglobin 7.9 today.  If next CBC shows hemoglobin < 8, transfuse 1 unit slowly.  5. Pulmonary: Definite pulmonary edema, possible CAP. Temp to 101.3 last night.  She is on ceftriaxone and azithromycin, cultures are negative so far.  Procalcitonin coming down. ?Component of septic shock contributing to lower blood pressure previously (hypotension resolved on dopamine).  Marca Ancona 10/11/2012 8:56 AM

## 2012-10-11 NOTE — Progress Notes (Signed)
PULMONARY  / CRITICAL CARE MEDICINE  Name: Nyjai L Yusuf MRN: [id number] DOB: 1970-05-14    ADMISSION DATE:  10/07/2012 CONSULTATION DATE:  10/08/12  REFERRING MD :  Yaakov Guthrie, MD PRIMARY SERVICE: Cardiology  CHIEF COMPLAINT:  SOB  BRIEF PATIENT DESCRIPTION:  42 years female with diastolic heart failure, CKD, HTN, DM, OSA, admitted 9/13 with sudden onset of SOB and hypoxemia. Chest X ray consistent with acute pulmonary edema. Did not tolerate BiPAP and required endotracheal intubation.    SIGNIFICANT EVENTS: 9/15 Transfuse 1 unit PRBC 9/16  Add HCO3 gtt, Transfuse 1 unit PRBC, renal consulted  STUDIES:  Echo 9/14 >> mild LVH, EF 55 to 60%, grade 2 diastolic dysfx  LINES / TUBES: ETT 9/13>>> R Radial aline 9/13>>> Lt IJ CVL 9/15 >>>  CULTURES: Blood 9/14 >> Sputum 9/14 >> Moderate beta hemolytic Streptococcus  ANTIBIOTICS: Rocephin 9/14>>> Azithro 9/14>>>  SUBJECTIVE:  PEEP, FiO2 needs improved.  Airtrapping with RR at 28 >> better with decrease in RR.  VITAL SIGNS: Temp:  [98.5 F (36.9 C)-101.3 F (38.5 C)] 98.5 F (36.9 C) (09/17 0400) Pulse Rate:  [85-118] 91 (09/17 0700) Resp:  [16-32] 25 (09/17 0700) BP: (101-134)/(51-62) 115/58 mmHg (09/17 0700) SpO2:  [90 %-100 %] 100 % (09/17 0700) FiO2 (%):  [50 %] 50 % (09/17 0425) Weight:  [286 lb 6 oz (129.9 kg)] 286 lb 6 oz (129.9 kg) (09/17 0500) HEMODYNAMICS: CVP:  [12 mmHg-28 mmHg] 12 mmHg VENTILATOR SETTINGS: Vent Mode:  [-] PRVC FiO2 (%):  [50 %] 50 % Set Rate:  [16 bmp-32 bmp] 28 bmp Vt Set:  [470 mL] 470 mL PEEP:  [5 cmH20-8 cmH20] 5 cmH20 Plateau Pressure:  [25 cmH20-34 cmH20] 27 cmH20 INTAKE / OUTPUT: Intake/Output     09/16 0701 - 09/17 0700 09/17 0701 - 09/18 0700   I.V. (mL/kg) 1155.3 (8.9)    Blood 350    NG/GT 974.9    IV Piggyback 498    Total Intake(mL/kg) 2978.1 (22.9)    Urine (mL/kg/hr) 2115 (0.7)    Total Output 2115     Net +863.1            PHYSICAL EXAMINATION: General: no  distress HEENT: ETT in place Heart: Regular, no murmur Lungs: scattered rhonchi Abdomen: soft, non tender, + bowel sounds. Musculoskeletal: no edema Skin: No rashes  Neuro: RASS -3  LABS:  CBC Recent Labs     10/09/12  0400   10/10/12  0500  10/10/12  1530  10/11/12  0400  WBC  11.4*   --   12.5*   --   11.7*  HGB  7.6*   < >  7.6*  8.4*  7.9*  HCT  23.2*   < >  23.6*  25.7*  23.2*  PLT  264   --   267   --   257   < > = values in this interval not displayed.   Iron Studies Lab Results  Component Value Date   IRON 49 10/10/2012   TIBC 188* 10/10/2012   FERRITIN 83 01/18/2012   Coag's No results found for this basename: APTT, INR,  in the last 72 hours  BMET Recent Labs     10/10/12  0500  10/10/12  1530  10/11/12  0400  NA  134*  134*  135  K  4.8  4.5  3.8  CL  101  100  101  CO2  15*  19  19  BUN  76*  89*  97*  CREATININE  3.80*  3.81*  3.41*  GLUCOSE  250*  342*  319*   Electrolytes Recent Labs     10/10/12  0500  10/10/12  1530  10/11/12  0400  CALCIUM  8.6  8.8  8.9  PHOS   --    --   5.1*   Sepsis Markers Recent Labs     10/09/12  0400  10/10/12  0500  10/11/12  0400  PROCALCITON  3.92  4.95  3.83   ABG Recent Labs     10/10/12  1735  10/10/12  2045  10/11/12  0443  PHART  7.258*  7.295*  7.365  PCO2ART  42.1  39.0  31.6*  PO2ART  71.0*  87.0  89.2   Liver Enzymes Recent Labs     10/11/12  0400  AST  12  ALT  11  ALKPHOS  117  BILITOT  0.2*  ALBUMIN  2.7*    Cardiac Enzymes Recent Labs     10/09/12  0150  10/09/12  0730  10/09/12  1331  TROPONINI  1.65*  0.87*  0.71*   Glucose Recent Labs     10/11/12  0129  10/11/12  0241  10/11/12  0343  10/11/12  0445  10/11/12  0550  10/11/12  0643  GLUCAP  288*  289*  285*  243*  195*  174*    Imaging Dg Chest Port 1 View  10/11/2012   CLINICAL DATA:  Check endotracheal tube.  EXAM: PORTABLE CHEST - 1 VIEW  COMPARISON:  CHEST x-ray from earlier the same day.   FINDINGS: Endotracheal tube ends in the mid thoracic trachea. Unchanged positioning of left IJ central venous catheter, tip near the upper SVC. Gastric suction tube crosses the diaphragm.  Unchanged cardiomegaly. Modest improvement in pulmonary edema, still mildly asymmetric. No interval pneumothorax or significant effusion accumulation.  IMPRESSION: 1. Stable positioning of tubes and lines. 2. Improving pulmonary edema.   Electronically Signed   By: Tiburcio Pea   On: 10/11/2012 05:55   Dg Chest Port 1 View  10/10/2012   CLINICAL DATA:  42 year old female with diastolic heart failure.  EXAM: PORTABLE CHEST - 1 VIEW  COMPARISON:  10/09/2012 and earlier.  FINDINGS: Portable AP semi upright view at 0609 hr. Stable endotracheal tube tip at the level the clavicles. Stable left IJ central line. Stable visible enteric tube.  Stable cardiac size and mediastinal contours. Increased retrocardiac opacity. Indistinctness of pulmonary vasculature and perihilar opacity continues. No large effusion. No pneumothorax.  IMPRESSION: 1. Stable lines and tubes.  2.  Increased left lower lobe collapse or consolidation.  3. Persistent evidence of pulmonary edema.   Electronically Signed   By: Augusto Gamble M.D.   On: 10/10/2012 08:22   Dg Chest Port 1 View  10/09/2012   *RADIOLOGY REPORT*  Clinical Data: Central line placement, ventilated  PORTABLE CHEST - 1 VIEW  Comparison: 10/09/2012  Findings: Left IJ central line tip terminates over the brachiocephalic/SVC junction.  No pneumothorax.  Endotracheal tube is appropriately positioned. Nasogastric tube terminates below the level of the diaphragms but the tip is not included on the film. Patchy pulmonary airspace opacities persist with mild cardiomegaly.  IMPRESSION: Left IJ central line tip over brachiocephalic/SVC junction.  No pneumothorax.   Original Report Authenticated By: Christiana Pellant, M.D.    ASSESSMENT / PLAN:  PULMONARY A: Acute respiratory failure 2nd to acute  pulmonary edema and pneumonia. Auto PEEP with high respiratory  rats. Hx of OSA - non compliant with CPAP as outpt. P:   -pressure support wean as tolerated >> not sure she is ready for extubation yet -f/u CXR -will need PAP therapy after extubation  CARDIOVASCULAR A:  Acute on chronic diastolic CHF. NSTEMI. Hypotension >> likely from diuresis, anemia, and meds. P:  -continue ASA -using DA to maintain hemodynamics  RENAL A:   Acute on chronic renal failure >> hypotension, hypoxia, anemia, and diuresis. Non gap metabolic acidosis >> improved. Stage 4 CKD. P:   -monitor renal fx, urine outpt, electrolytes -lasix per renal, cardiology  GASTROINTESTINAL A:   Nutrition. DM gastroparesis. P:   -tube feeds while on vent -added reglan 9/16 -protonix for SUP  HEMATOLOGIC A:   Anemia >> no obvious bleeding. P:  -f/u CBC -transfuse for Hb < 8 in setting of possible ACS -f/u anemia panel  INFECTIOUS A:   CAP with beta hemolytic Streptococcus in sputum. P:  -Day 4 rocephin, zithromax  ENDOCRINE A:   DM type II with hyperglycemia. P:   -transition off insulin gtt per protocol  NEUROLOGIC A:   Sedation. P:   -goal RASS 0 to -1  Summary: Oxygenation and respiratory mechanics improved some today.  Will see how she tolerates vent weaning, but don't think she will be ready for extubation yet.  CC time 35 minutes.  Coralyn Helling, MD [facility] Pulmonary/Critical Care 10/11/2012, 7:56 AM Pager:  [phone] After 3pm call: [phone]

## 2012-10-11 NOTE — Progress Notes (Signed)
S:intubated O:BP 124/63  Pulse 99  Temp(Src) 98 F (36.7 C) (Oral)  Resp 20  Ht 5\' 6"  (1.676 m)  Wt 129.9 kg (286 lb 6 oz)  BMI 46.24 kg/m2  SpO2 99%  LMP 09/27/2012  Intake/Output Summary (Last 24 hours) at 10/11/12 1153 Last data filed at 10/11/12 1000  Gross per 24 hour  Intake 2744.44 ml  Output   2805 ml  Net -60.56 ml   Weight change: -0.8 kg (-1 lb 12.2 oz) WUJ:[id number] CVS:RRR Resp:basilar crackles Abd:+ BS NTND Ext: + edema NEURO: opens eyes    . antiseptic oral rinse  15 mL Mouth Rinse QID  . aspirin  81 mg Per Tube Daily  . atorvastatin  40 mg Per Tube q1800  . azithromycin  500 mg Intravenous Q24H  . cefTRIAXone (ROCEPHIN)  IV  2 g Intravenous Q24H  . chlorhexidine  15 mL Mouth Rinse BID  . feeding supplement  30 mL Per Tube QID  . furosemide  160 mg Intravenous Q6H  . heparin subcutaneous  5,000 Units Subcutaneous Q8H  . insulin aspart  1-3 Units Subcutaneous Q4H  . metoCLOPramide (REGLAN) injection  5 mg Intravenous Q8H  . metolazone  5 mg Oral Once  . pantoprazole sodium  40 mg Per Tube Q24H  . sodium chloride  10 mL Intracatheter Q12H   Dg Chest Port 1 View  10/11/2012   *RADIOLOGY REPORT*  Clinical Data: Pulmonary edema, follow-up  PORTABLE CHEST - 1 VIEW  Comparison: Portable chest x-ray of 10/10/2012  Findings: There is little change in poor aeration with pulmonary edema.  Cardiomegaly is stable.  The endotracheal tube and left central venous line are unchanged in position.  IMPRESSION: No change in poor aeration and moderate pulmonary edema.   Original Report Authenticated By: Dwyane Dee, M.D.   Dg Chest Port 1 View  10/11/2012   CLINICAL DATA:  Check endotracheal tube.  EXAM: PORTABLE CHEST - 1 VIEW  COMPARISON:  CHEST x-ray from earlier the same day.  FINDINGS: Endotracheal tube ends in the mid thoracic trachea. Unchanged positioning of left IJ central venous catheter, tip near the upper SVC. Gastric suction tube crosses the diaphragm.  Unchanged  cardiomegaly. Modest improvement in pulmonary edema, still mildly asymmetric. No interval pneumothorax or significant effusion accumulation.  IMPRESSION: 1. Stable positioning of tubes and lines. 2. Improving pulmonary edema.   Electronically Signed   By: Tiburcio Pea   On: 10/11/2012 05:55   Dg Chest Port 1 View  10/10/2012   CLINICAL DATA:  42 year old female with diastolic heart failure.  EXAM: PORTABLE CHEST - 1 VIEW  COMPARISON:  10/09/2012 and earlier.  FINDINGS: Portable AP semi upright view at 0609 hr. Stable endotracheal tube tip at the level the clavicles. Stable left IJ central line. Stable visible enteric tube.  Stable cardiac size and mediastinal contours. Increased retrocardiac opacity. Indistinctness of pulmonary vasculature and perihilar opacity continues. No large effusion. No pneumothorax.  IMPRESSION: 1. Stable lines and tubes.  2.  Increased left lower lobe collapse or consolidation.  3. Persistent evidence of pulmonary edema.   Electronically Signed   By: Augusto Gamble M.D.   On: 10/10/2012 08:22   Dg Chest Port 1 View  10/09/2012   *RADIOLOGY REPORT*  Clinical Data: Central line placement, ventilated  PORTABLE CHEST - 1 VIEW  Comparison: 10/09/2012  Findings: Left IJ central line tip terminates over the brachiocephalic/SVC junction.  No pneumothorax.  Endotracheal tube is appropriately positioned. Nasogastric tube terminates below the level  of the diaphragms but the tip is not included on the film. Patchy pulmonary airspace opacities persist with mild cardiomegaly.  IMPRESSION: Left IJ central line tip over brachiocephalic/SVC junction.  No pneumothorax.   Original Report Authenticated By: Christiana Pellant, M.D.   BMET    Component Value Date/Time   NA 135 10/11/2012 0400   K 3.8 10/11/2012 0400   CL 101 10/11/2012 0400   CO2 19 10/11/2012 0400   GLUCOSE 319* 10/11/2012 0400   BUN 97* 10/11/2012 0400   CREATININE 3.41* 10/11/2012 0400   CALCIUM 8.9 10/11/2012 0400   GFRNONAA 16*  10/11/2012 0400   GFRAA 18* 10/11/2012 0400   CBC    Component Value Date/Time   WBC 11.7* 10/11/2012 0400   RBC 2.73* 10/11/2012 0400   RBC 2.73* 10/11/2012 0400   HGB 7.9* 10/11/2012 0400   HCT 23.2* 10/11/2012 0400   PLT 257 10/11/2012 0400   MCV 85.0 10/11/2012 0400   MCH 28.9 10/11/2012 0400   MCHC 34.1 10/11/2012 0400   RDW 13.6 10/11/2012 0400   LYMPHSABS 1.0 10/07/2012 2126   MONOABS 0.9 10/07/2012 2126   EOSABS 0.3 10/07/2012 2126   BASOSABS 0.1 10/07/2012 2126     Assessment: 1. CKD 4 with volume overload, improving 2. Diastolic dysfunction and probable Rt ht failure 3. Anemia  Plan: 1. Cont current diuretics 2. IV Iron 3. Start aranesp 4. Daily Scr    Jasmine [name] T

## 2012-10-11 NOTE — Progress Notes (Signed)
Dr Craige Cotta at bedside to eval pt.  MD weaned set rate to 14, and 40%.  PT tol change well so far, no distress noted. Plans: to try to wean vent today if pt wakes up. RN aware.

## 2012-10-11 NOTE — Plan of Care (Signed)
Problem: Phase I Progression Outcomes Goal: Voiding-avoid urinary catheter unless indicated Outcome: Not Met (add Reason) Pt requires urinary catheter d/t sedate and intubated as well as IV Lasix.

## 2012-10-11 NOTE — Progress Notes (Signed)
Changed pt to cpap/ps per Dr. Craige Cotta plan to wean vent.  Pt tol well so far, no distress noted, VSS. Pt is awake and follows commands.

## 2012-10-11 NOTE — Progress Notes (Signed)
NUTRITION FOLLOW UP  Intervention:   1. Recommend increase Vital HP by 10 ml q 4 hr to the goal rate of 45 ml/hr to provide 1480 kcal, 155 grams of protein, and 902 ml of H2O.    Nutrition Dx:   Inadequate oral intake related to inability to eat as evidenced by NPO diet/mechanical ventilation.  Goal:   Enteral nutrition to provide 60-70% of estimated calorie needs (22-25 kcals/kg ideal body weight) and 100% of estimated protein needs, based on ASPEN guidelines for permissive underfeeding in critically ill obese individuals.   Monitor:   Vent status, TF initiation and tolerance, weight trends, labs   Assessment:   Pt remains intubated. Nephrology following for renal function, CKD at baseline, possible need for CRRT? Remains fluid overloaded.   Pt tube feeding currently at 1/2 rate, had some elevated residuals that were not decreased with decreased rate. TF was held for 4 hours and reglan started. TF has been at 22 ml/hr since 7p 9/16 and has not yet been advanced.   Tube feeding at goal rate, Vital HP @ 45 ml/hr and 30 ml Pro-stat QID provides kcal, 155 grams of protein, and 902 ml of H2O. Currently at 22 ml/hr and 30 ml Pro-stat QID pt is getting: 928 kcal, 106 gm protein, and 441 ml free water daily.    Height: Ht Readings from Last 1 Encounters:  10/08/12 5\' 6"  (1.676 m)    Weight Status:   Wt Readings from Last 1 Encounters:  10/11/12 286 lb 6 oz (129.9 kg)   Patient is currently intubated on ventilator support.  MV: 10.5 L/min Temp:Temp (24hrs), Avg:99.3 F (37.4 C), Min:97.4 F (36.3 C), Max:101.3 F (38.5 C)  Propofol: none   Re-estimated needs:  Kcal: 2441 Underfeeding goal: 1465-1709 Protein: >/= 148 gm  Fluid: per MD  Skin: intact   Diet Order: NPO   Intake/Output Summary (Last 24 hours) at 10/11/12 1137 Last data filed at 10/11/12 1000  Gross per 24 hour  Intake 2744.44 ml  Output   2805 ml  Net -60.56 ml    Last BM: none  documented   Labs:   Recent Labs Lab 10/07/12 2119  10/10/12 0500 10/10/12 1530 10/11/12 0400  NA  --   < > 134* 134* 135  K  --   < > 4.8 4.5 3.8  CL  --   < > 101 100 101  CO2  --   < > 15* 19 19  BUN  --   < > 76* 89* 97*  CREATININE  --   < > 3.80* 3.81* 3.41*  CALCIUM  --   < > 8.6 8.8 8.9  MG 2.0  --   --   --   --   PHOS  --   --   --   --  5.1*  GLUCOSE  --   < > 250* 342* 319*  < > = values in this interval not displayed.  CBG (last 3)   Recent Labs  10/11/12 0445 10/11/12 0550 10/11/12 0643  GLUCAP 243* 195* 174*    Scheduled Meds: . antiseptic oral rinse  15 mL Mouth Rinse QID  . aspirin  81 mg Per Tube Daily  . atorvastatin  40 mg Per Tube q1800  . azithromycin  500 mg Intravenous Q24H  . cefTRIAXone (ROCEPHIN)  IV  2 g Intravenous Q24H  . chlorhexidine  15 mL Mouth Rinse BID  . feeding supplement  30 mL Per Tube QID  .  furosemide  160 mg Intravenous Q6H  . heparin subcutaneous  5,000 Units Subcutaneous Q8H  . insulin aspart  1-3 Units Subcutaneous Q4H  . metoCLOPramide (REGLAN) injection  5 mg Intravenous Q8H  . metolazone  5 mg Oral Once  . pantoprazole sodium  40 mg Per Tube Q24H  . sodium chloride  10 mL Intracatheter Q12H    Continuous Infusions: . sodium chloride 20 mL/hr at 10/11/12 0800  . DOPamine 2 mcg/kg/min (10/11/12 0900)  . feeding supplement (VITAL HIGH PROTEIN) 1,000 mL (10/11/12 0800)  . fentaNYL infusion INTRAVENOUS 200 mcg/hr (10/11/12 0700)  . insulin (NOVOLIN-R) infusion 3.5 mL/hr at 10/11/12 0800  . midazolam (VERSED) infusion 2 mg/hr (10/11/12 0700)    Isabell Jarvis RD, LDN Pager [phone] After Hours pager [phone]

## 2012-10-11 NOTE — Progress Notes (Signed)
eLink Physician-Brief Progress Note Patient Name: Jasmine [name] DOB: 1970/10/20 MRN: [id number]  Date of Service  10/11/2012   HPI/Events of Note   Sugars > 200mg %  eICU Interventions  Change ssi to icu hyperglycemia   Intervention Category Major Interventions: Respiratory failure - evaluation and management Intermediate Interventions: Bleeding - evaluation and treatment with blood products  Retaj Hilbun 10/11/2012, 12:20 AM

## 2012-10-11 NOTE — Progress Notes (Signed)
RN called to bedside d/t pt very agitated and desat to 84%, rr 40.  RN bolused sedation, placed pt back on rest mode, increased fio2 to 60%, pt appears more comfortable post sedation and rest mode.  Sats increased to 93-94% on 60% fio2.  RN aware.

## 2012-10-11 NOTE — Progress Notes (Signed)
eLink Physician-Brief Progress Note Patient Name: Jasmine [name] DOB: 1971-01-07 MRN: [id number]  Date of Service  10/11/2012   HPI/Events of Note   Restraints x 5h  eICU Interventions     Intervention Category Major Interventions: Respiratory failure - evaluation and management Intermediate Interventions: Bleeding - evaluation and treatment with blood products  Rorik Vespa 10/11/2012, 4:12 AM

## 2012-10-12 ENCOUNTER — Inpatient Hospital Stay (HOSPITAL_COMMUNITY): Payer: 59

## 2012-10-12 LAB — POCT I-STAT 3, ART BLOOD GAS (G3+)
Bicarbonate: 23.5 mEq/L (ref 20.0–24.0)
Bicarbonate: 25.1 mEq/L — ABNORMAL HIGH (ref 20.0–24.0)
Patient temperature: 98.1
TCO2: 25 mmol/L (ref 0–100)
TCO2: 26 mmol/L (ref 0–100)
pCO2 arterial: 38.9 mmHg (ref 35.0–45.0)
pH, Arterial: 7.423 (ref 7.350–7.450)
pH, Arterial: 7.418 (ref 7.350–7.450)
pO2, Arterial: 61 mmHg — ABNORMAL LOW (ref 80.0–100.0)
pO2, Arterial: 54 mmHg — ABNORMAL LOW (ref 80.0–100.0)

## 2012-10-12 LAB — CBC
MCH: 28.8 pg (ref 26.0–34.0)
MCHC: 34 g/dL (ref 30.0–36.0)
Platelets: 303 10*3/uL (ref 150–400)
RDW: 13.7 % (ref 11.5–15.5)

## 2012-10-12 LAB — GLUCOSE, CAPILLARY
Glucose-Capillary: 108 mg/dL — ABNORMAL HIGH (ref 70–99)
Glucose-Capillary: 111 mg/dL — ABNORMAL HIGH (ref 70–99)
Glucose-Capillary: 116 mg/dL — ABNORMAL HIGH (ref 70–99)
Glucose-Capillary: 117 mg/dL — ABNORMAL HIGH (ref 70–99)
Glucose-Capillary: 121 mg/dL — ABNORMAL HIGH (ref 70–99)
Glucose-Capillary: 144 mg/dL — ABNORMAL HIGH (ref 70–99)
Glucose-Capillary: 149 mg/dL — ABNORMAL HIGH (ref 70–99)
Glucose-Capillary: 158 mg/dL — ABNORMAL HIGH (ref 70–99)
Glucose-Capillary: 159 mg/dL — ABNORMAL HIGH (ref 70–99)
Glucose-Capillary: 170 mg/dL — ABNORMAL HIGH (ref 70–99)
Glucose-Capillary: 171 mg/dL — ABNORMAL HIGH (ref 70–99)
Glucose-Capillary: 173 mg/dL — ABNORMAL HIGH (ref 70–99)
Glucose-Capillary: 196 mg/dL — ABNORMAL HIGH (ref 70–99)

## 2012-10-12 LAB — BASIC METABOLIC PANEL
BUN: 103 mg/dL — ABNORMAL HIGH (ref 6–23)
Calcium: 9.3 mg/dL (ref 8.4–10.5)
GFR calc Af Amer: 27 mL/min — ABNORMAL LOW (ref >90)
GFR calc non Af Amer: 23 mL/min — ABNORMAL LOW (ref >90)
Glucose, Bld: 136 mg/dL — ABNORMAL HIGH (ref 70–99)

## 2012-10-12 MED ORDER — INSULIN ASPART 100 UNIT/ML ~~LOC~~ SOLN: 1.0000 [IU] | SUBCUTANEOUS | Status: DC

## 2012-10-12 MED ORDER — CEFAZOLIN SODIUM-DEXTROSE 2-3 GM-% IV SOLR
2.0000 g | Freq: Three times a day (TID) | INTRAVENOUS | Status: AC
  Administered 2012-10-12 – 2012-10-14 (×8): 2 g via INTRAVENOUS
  Filled 2012-10-12 (×8): qty 50

## 2012-10-12 MED ORDER — INSULIN GLARGINE 100 UNIT/ML ~~LOC~~ SOLN
10.0000 [IU] | SUBCUTANEOUS | Status: DC
  Administered 2012-10-12 – 2012-10-19 (×8): 10 [IU] via SUBCUTANEOUS
  Filled 2012-10-12 (×10): qty 0.1

## 2012-10-12 MED ORDER — PANTOPRAZOLE SODIUM 40 MG IV SOLR
40.0000 mg | INTRAVENOUS | Status: DC
  Administered 2012-10-12: 40 mg via INTRAVENOUS
  Filled 2012-10-12 (×2): qty 40

## 2012-10-12 MED ORDER — DEXTROSE 5 % IV SOLN
160.0000 mg | Freq: Three times a day (TID) | INTRAVENOUS | Status: DC
  Administered 2012-10-12 (×3): 160 mg via INTRAVENOUS
  Filled 2012-10-12 (×4): qty 16

## 2012-10-12 MED ORDER — FENTANYL CITRATE 0.05 MG/ML IJ SOLN
12.5000 ug | INTRAMUSCULAR | Status: DC | PRN
  Administered 2012-10-12 – 2012-10-14 (×6): 25 ug via INTRAVENOUS
  Filled 2012-10-12 (×5): qty 2

## 2012-10-12 MED ORDER — ATORVASTATIN CALCIUM 40 MG PO TABS
40.0000 mg | ORAL_TABLET | Freq: Every day | ORAL | Status: DC
  Administered 2012-10-13 – 2012-10-21 (×9): 40 mg via ORAL
  Filled 2012-10-12 (×11): qty 1

## 2012-10-12 MED ORDER — ASPIRIN 81 MG PO CHEW
81.0000 mg | CHEWABLE_TABLET | Freq: Every day | ORAL | Status: DC
  Administered 2012-10-14 – 2012-10-21 (×7): 81 mg via ORAL
  Filled 2012-10-12 (×10): qty 1

## 2012-10-12 NOTE — Progress Notes (Signed)
Inpatient Diabetes Program Recommendations  AACE/ADA: New Consensus Statement on Inpatient Glycemic Control (2013)  Target Ranges:  Prepandial:   less than 140 mg/dL      Peak postprandial:   less than 180 mg/dL (1-2 hours)      Critically ill patients:  140 - 180 mg/dL   Reason for Visit: CBG's continue to be up and down.  Patient continues on insulin drip-Phase 2 of ICU Glycemic control protocol.  Tube feeds are now off and patient has been extubated.  Prior to admission patient was on  Novolog 70/30 insulin 40 units in the morning and 30 units in the PM.  Will continue to follow.  Discussed with RN. Beryl Meager, RN, BC-ADM Inpatient Diabetes Coordinator Pager [phone]

## 2012-10-12 NOTE — Procedures (Signed)
Extubation Procedure Note  Patient Details:   Name: Jasmine [name] DOB: [birth year] MRN: [id number]   Airway Documentation:   Pt w/ + air leak around cuff, per Dr. Craige Cotta extubate pt.  Evaluation  O2 sats: stable throughout Complications: No apparent complications Patient did tolerate procedure well. Bilateral Breath Sounds: Clear;Diminished Suctioning: Airway;Oral Yes, pt able to speak.  MD into eval pt after extubation, per MD pt is okay presently.  Orders for prn and qhs bipap noted.   Jennette Kettle 10/12/2012, 10:37 AM

## 2012-10-12 NOTE — Progress Notes (Signed)
While I was with another pt, my coworker was helping me in the unit and changed this pt to NIV/PC d/t pt was dis synchronized on prev. Settings.  Pt appears comfortable on NIV/PC.  NO distress noted.  [facility] clear presently.  HR 96, Sat 98%

## 2012-10-12 NOTE — Progress Notes (Signed)
RN weaned sedation, pt is waking up.  Placed pt on wean 5 ps, 5 peep.  Pt appears to be tol well so far, no distress noted.  RN aware.

## 2012-10-12 NOTE — Progress Notes (Signed)
Dr Craige Cotta called d/t pt w/ increased RR, increased WOB.  Sats 88-91% on 6 lpm [place].  Placed pt on NIV per Dr. Craige Cotta order.  Pt immed w/ decreased WOB.  MD into eval pt.

## 2012-10-12 NOTE — Progress Notes (Signed)
After extubation, pt appears lethargic and states she is SOB. MD Sood at bedside. Orders received for prn bipap. 1 hour post extubation ABG was WNL and vital signs are stable on 6L [place]. Will monitor  Jasmine [name]

## 2012-10-12 NOTE — Progress Notes (Signed)
PULMONARY  / CRITICAL CARE MEDICINE  Name: Jasmine [name] MRN: [id number] DOB: [birth year]    ADMISSION DATE:  10/07/2012 CONSULTATION DATE:  10/08/12  REFERRING MD :  Yaakov Guthrie, MD PRIMARY SERVICE: Cardiology  CHIEF COMPLAINT:  SOB  BRIEF PATIENT DESCRIPTION:  42 years female with diastolic heart failure, CKD, HTN, DM, OSA, admitted 9/13 with sudden onset of SOB and hypoxemia. Chest X ray consistent with acute pulmonary edema. Did not tolerate BiPAP and required endotracheal intubation.    SIGNIFICANT EVENTS: 9/15 Transfuse 1 unit PRBC 9/16  Add HCO3 gtt, Transfuse 1 unit PRBC, renal consulted  STUDIES:  Echo 9/14 >> mild LVH, EF 55 to 60%, grade 2 diastolic dysfx  LINES / TUBES: ETT 9/13>>> R Radial aline 9/13>>> Lt IJ CVL 9/15 >>>  CULTURES: Blood 9/14 >> Sputum 9/14 >> Moderate beta hemolytic Streptococcus  ANTIBIOTICS: Rocephin 9/14>>> 9/18 Azithro 9/14>>> 9/18 Ancef 9/18 >>>   SUBJECTIVE:  Respiratory mechanics improved, and tolerating SBT.  VITAL SIGNS: Temp:  [98 F (36.7 C)-99 F (37.2 C)] 98.3 F (36.8 C) (09/18 0801) Pulse Rate:  [85-127] 96 (09/18 1000) Resp:  [15-40] 22 (09/18 1000) BP: (99-160)/(46-68) 134/60 mmHg (09/18 1000) SpO2:  [84 %-100 %] 99 % (09/18 1000) FiO2 (%):  [40 %-60 %] 40 % (09/18 1000) Weight:  [279 lb 5.2 oz (126.7 kg)] 279 lb 5.2 oz (126.7 kg) (09/18 0300) HEMODYNAMICS: CVP:  [15 mmHg-22 mmHg] 20 mmHg VENTILATOR SETTINGS: Vent Mode:  [-] PSV;CPAP FiO2 (%):  [40 %-60 %] 40 % Set Rate:  [14 bmp] 14 bmp Vt Set:  [470 mL] 470 mL PEEP:  [5 cmH20] 5 cmH20 Pressure Support:  [5 cmH20] 5 cmH20 Plateau Pressure:  [17 cmH20-25 cmH20] 17 cmH20 INTAKE / OUTPUT: Intake/Output     09/17 0701 - 09/18 0700 09/18 0701 - 09/19 0700   I.V. (mL/kg) 1003.6 (7.9) 57.9 (0.5)   Blood     NG/GT 738 96   IV Piggyback 629 116   Total Intake(mL/kg) 2370.6 (18.7) 269.9 (2.1)   Urine (mL/kg/hr) 5625 (1.8) 700 (1.8)   Total Output 5625 700   Net -3254.4 -430.1          PHYSICAL EXAMINATION: General: no distress HEENT: ETT in place Heart: Regular, no murmur Lungs: scattered rhonchi Abdomen: soft, non tender, + bowel sounds. Musculoskeletal: no edema Skin: No rashes  Neuro: RASS -1, follows commands, moves all extremities  LABS:  CBC Recent Labs     10/10/12  0500  10/10/12  1530  10/11/12  0400  10/12/12  0435  WBC  12.5*   --   11.7*  11.1*  HGB  7.6*  8.4*  7.9*  8.7*  HCT  23.6*  25.7*  23.2*  25.6*  PLT  267   --   257  303   Iron Studies Lab Results  Component Value Date   IRON 15* 10/11/2012   TIBC 163* 10/11/2012   FERRITIN 699* 10/11/2012   Coag's No results found for this basename: APTT, INR,  in the last 72 hours  BMET Recent Labs     10/10/12  1530  10/11/12  0400  10/12/12  0435  NA  134*  135  139  K  4.5  3.8  3.5  CL  100  101  99  CO2  19  19  23   BUN  89*  97*  103*  CREATININE  3.81*  3.41*  2.47*  GLUCOSE  342*  319*  136*  Electrolytes Recent Labs     10/10/12  1530  10/11/12  0400  10/12/12  0435  CALCIUM  8.8  8.9  9.3  PHOS   --   5.1*   --    Sepsis Markers Recent Labs     10/10/12  0500  10/11/12  0400  PROCALCITON  4.95  3.83   ABG Recent Labs     10/10/12  1735  10/10/12  2045  10/11/12  0443  PHART  7.258*  7.295*  7.365  PCO2ART  42.1  39.0  31.6*  PO2ART  71.0*  87.0  89.2   Liver Enzymes Recent Labs     10/11/12  0400  AST  12  ALT  11  ALKPHOS  117  BILITOT  0.2*  ALBUMIN  2.7*    Cardiac Enzymes Recent Labs     10/09/12  1331  TROPONINI  0.71*   Glucose Recent Labs     10/11/12  1955  10/11/12  2059  10/11/12  2204  10/11/12  2317  10/12/12  0018  10/12/12  0759  GLUCAP  131*  154*  149*  171*  196*  158*    Imaging Dg Chest Port 1 View  10/12/2012   CLINICAL DATA:  Pneumonia, CHF, followup  EXAM: PORTABLE CHEST - 1 VIEW  COMPARISON:  Portable chest x-ray of 10/11/2012  FINDINGS: There is no change in position of the  endotracheal tube and left central venous line. There may have been some slight worsening of the degree of moderate pulmonary vascular congestion with cardiomegaly present.  IMPRESSION: 1. Apparent worsening of mild CHF. 2. Stable position of endotracheal tube and left central venous line.   Electronically Signed   By: Dwyane Dee M.D.   On: 10/12/2012 08:19   Dg Chest Port 1 View  10/11/2012   *RADIOLOGY REPORT*  Clinical Data: Pulmonary edema, follow-up  PORTABLE CHEST - 1 VIEW  Comparison: Portable chest x-ray of 10/10/2012  Findings: There is little change in poor aeration with pulmonary edema.  Cardiomegaly is stable.  The endotracheal tube and left central venous line are unchanged in position.  IMPRESSION: No change in poor aeration and moderate pulmonary edema.   Original Report Authenticated By: Dwyane Dee, M.D.   Dg Chest Port 1 View  10/11/2012   CLINICAL DATA:  Check endotracheal tube.  EXAM: PORTABLE CHEST - 1 VIEW  COMPARISON:  CHEST x-ray from earlier the same day.  FINDINGS: Endotracheal tube ends in the mid thoracic trachea. Unchanged positioning of left IJ central venous catheter, tip near the upper SVC. Gastric suction tube crosses the diaphragm.  Unchanged cardiomegaly. Modest improvement in pulmonary edema, still mildly asymmetric. No interval pneumothorax or significant effusion accumulation.  IMPRESSION: 1. Stable positioning of tubes and lines. 2. Improving pulmonary edema.   Electronically Signed   By: Tiburcio Pea   On: 10/11/2012 05:55    ASSESSMENT / PLAN:  PULMONARY A: Acute respiratory failure 2nd to acute pulmonary edema and pneumonia. Hx of OSA - non compliant with CPAP as outpt. P:   -proceed with extubation 9/18 -f/u CXR -will need PAP therapy after extubation  CARDIOVASCULAR A:  Acute on chronic diastolic CHF. NSTEMI. Hypotension >> likely from diuresis, anemia, and meds. P:  -continue ASA -using DA to maintain hemodynamics  RENAL A:   Acute on  chronic renal failure >> hypotension, hypoxia, anemia, and diuresis. Non gap metabolic acidosis >> improved. Stage 4 CKD. P:   -monitor renal fx,  urine outpt, electrolytes -lasix per renal, cardiology  GASTROINTESTINAL A:   Nutrition. DM gastroparesis. P:   -assess swallowing after extubation, and then advance diet -added reglan 9/16 -protonix for SUP  HEMATOLOGIC A:   Anemia >> no obvious bleeding. P:  -f/u CBC -transfuse for Hb < 8 in setting of possible ACS -aranesp per renal  INFECTIOUS A:   CAP with beta hemolytic Streptococcus in sputum. P:  -Day 5 abx, changed to ancef  ENDOCRINE A:   DM type II with hyperglycemia. P:   -transition off insulin gtt per protocol  NEUROLOGIC A:   Pain control. P:   -prn fentanyl  D/w Dr. Jones Broom.  CC time 35 minutes.  Coralyn Helling, MD [facility] Pulmonary/Critical Care 10/12/2012, 10:04 AM Pager:  [phone] After 3pm call: [phone]

## 2012-10-12 NOTE — Progress Notes (Signed)
S:intubated O:BP 120/52  Pulse 90  Temp(Src) 98.1 F (36.7 C) (Oral)  Resp 17  Ht 5\' 6"  (1.676 m)  Wt 126.7 kg (279 lb 5.2 oz)  BMI 45.11 kg/m2  SpO2 99%  LMP 09/27/2012  Intake/Output Summary (Last 24 hours) at 10/12/12 0716 Last data filed at 10/12/12 0600  Gross per 24 hour  Intake 2301.2 ml  Output   5625 ml  Net -3323.8 ml   Weight change: -3.2 kg (-7 lb 0.9 oz) AOZ:HYQMVHQIO CVS:RRR Resp:basilar crackles Abd:+ BS NTND Ext: + edema NEURO: opens eyes    . antiseptic oral rinse  15 mL Mouth Rinse QID  . aspirin  81 mg Per Tube Daily  . atorvastatin  40 mg Per Tube q1800  . azithromycin  500 mg Intravenous Q24H  . cefTRIAXone (ROCEPHIN)  IV  2 g Intravenous Q24H  . chlorhexidine  15 mL Mouth Rinse BID  . [START ON 10/18/2012] darbepoetin (ARANESP) injection - NON-DIALYSIS  100 mcg Subcutaneous Q Wed-1800  . feeding supplement  30 mL Per Tube QID  . furosemide  160 mg Intravenous Q6H  . heparin subcutaneous  5,000 Units Subcutaneous Q8H  . insulin aspart  1-3 Units Subcutaneous Q4H  . metoCLOPramide (REGLAN) injection  5 mg Intravenous Q8H  . pantoprazole sodium  40 mg Per Tube Q24H  . sodium chloride  10 mL Intracatheter Q12H   Dg Chest Port 1 View  10/11/2012   *RADIOLOGY REPORT*  Clinical Data: Pulmonary edema, follow-up  PORTABLE CHEST - 1 VIEW  Comparison: Portable chest x-ray of 10/10/2012  Findings: There is little change in poor aeration with pulmonary edema.  Cardiomegaly is stable.  The endotracheal tube and left central venous line are unchanged in position.  IMPRESSION: No change in poor aeration and moderate pulmonary edema.   Original Report Authenticated By: Dwyane Dee, M.D.   Dg Chest Port 1 View  10/11/2012   CLINICAL DATA:  Check endotracheal tube.  EXAM: PORTABLE CHEST - 1 VIEW  COMPARISON:  CHEST x-ray from earlier the same day.  FINDINGS: Endotracheal tube ends in the mid thoracic trachea. Unchanged positioning of left IJ central venous catheter,  tip near the upper SVC. Gastric suction tube crosses the diaphragm.  Unchanged cardiomegaly. Modest improvement in pulmonary edema, still mildly asymmetric. No interval pneumothorax or significant effusion accumulation.  IMPRESSION: 1. Stable positioning of tubes and lines. 2. Improving pulmonary edema.   Electronically Signed   By: Tiburcio Pea   On: 10/11/2012 05:55   BMET    Component Value Date/Time   NA 139 10/12/2012 0435   K 3.5 10/12/2012 0435   CL 99 10/12/2012 0435   CO2 23 10/12/2012 0435   GLUCOSE 136* 10/12/2012 0435   BUN 103* 10/12/2012 0435   CREATININE 2.47* 10/12/2012 0435   CALCIUM 9.3 10/12/2012 0435   GFRNONAA 23* 10/12/2012 0435   GFRAA 27* 10/12/2012 0435   CBC    Component Value Date/Time   WBC 11.1* 10/12/2012 0435   RBC 3.02* 10/12/2012 0435   RBC 2.73* 10/11/2012 0400   HGB 8.7* 10/12/2012 0435   HCT 25.6* 10/12/2012 0435   PLT 303 10/12/2012 0435   MCV 84.8 10/12/2012 0435   MCH 28.8 10/12/2012 0435   MCHC 34.0 10/12/2012 0435   RDW 13.7 10/12/2012 0435   LYMPHSABS 1.0 10/07/2012 2126   MONOABS 0.9 10/07/2012 2126   EOSABS 0.3 10/07/2012 2126   BASOSABS 0.1 10/07/2012 2126     Assessment: 1. CKD 4 with volume  overload, improving.  UO excellent 2. Diastolic dysfunction and probable Rt ht failure 3. Anemia  Plan: 1. Cont current diuretics 2.wean dopamine 3.  Scr in AM 4. Decrease lasix to TID   Jamoni Broadfoot T

## 2012-10-12 NOTE — Progress Notes (Signed)
Patient ID: Jasmine [name], female   DOB: 02/22/1970, 42 y.o.   MRN: [id number]  SUBJECTIVE: Yesterday metolazone given before lasix and weight down 7 lbs. Dopamine turned down to 2 mcg./kg/min. SBP more controlled 90-120s. Cr trending down 2.47. 24hr UOP -3.3 liters. CVP now 12. Remains intubated and sedated.  Plan for extubation today.   Renal team decreased lasix to 160 tid. U/o very brisk  . antiseptic oral rinse  15 mL Mouth Rinse QID  . aspirin  81 mg Per Tube Daily  . atorvastatin  40 mg Per Tube q1800  . azithromycin  500 mg Intravenous Q24H  . cefTRIAXone (ROCEPHIN)  IV  2 g Intravenous Q24H  . chlorhexidine  15 mL Mouth Rinse BID  . [START ON 10/18/2012] darbepoetin (ARANESP) injection - NON-DIALYSIS  100 mcg Subcutaneous Q Wed-1800  . feeding supplement  30 mL Per Tube QID  . furosemide  160 mg Intravenous Q6H  . heparin subcutaneous  5,000 Units Subcutaneous Q8H  . insulin aspart  1-3 Units Subcutaneous Q4H  . metoCLOPramide (REGLAN) injection  5 mg Intravenous Q8H  . pantoprazole sodium  40 mg Per Tube Q24H  . sodium chloride  10 mL Intracatheter Q12H  Dopamine @ 2 Versed/Fentanyl gtts   Filed Vitals:   10/12/12 0436 10/12/12 0500 10/12/12 0600 10/12/12 0700  BP: 108/50 99/53 116/55 120/52  Pulse: 89 88 91 90  Temp:      TempSrc:      Resp: 20 19 19 17   Height:      Weight:      SpO2: 100% 96% 96% 99%    Intake/Output Summary (Last 24 hours) at 10/12/12 0711 Last data filed at 10/12/12 0600  Gross per 24 hour  Intake 2301.2 ml  Output   5625 ml  Net -3323.8 ml    LABS: Basic Metabolic Panel:  Recent Labs  14/78/29 0400 10/12/12 0435  NA 135 139  K 3.8 3.5  CL 101 99  CO2 19 23  GLUCOSE 319* 136*  BUN 97* 103*  CREATININE 3.41* 2.47*  CALCIUM 8.9 9.3  PHOS 5.1*  --    Liver Function Tests:  Recent Labs  10/11/12 0400  AST 12  ALT 11  ALKPHOS 117  BILITOT 0.2*  PROT 6.9  ALBUMIN 2.7*   No results found for this basename: LIPASE,  AMYLASE,  in the last 72 hours CBC:  Recent Labs  10/11/12 0400 10/12/12 0435  WBC 11.7* 11.1*  HGB 7.9* 8.7*  HCT 23.2* 25.6*  MCV 85.0 84.8  PLT 257 303   Cardiac Enzymes:  Recent Labs  10/09/12 0730 10/09/12 1331  TROPONINI 0.87* 0.71*   BNP: No components found with this basename: POCBNP,  D-Dimer: No results found for this basename: DDIMER,  in the last 72 hours Hemoglobin A1C: No results found for this basename: HGBA1C,  in the last 72 hours Fasting Lipid Panel: No results found for this basename: CHOL, HDL, LDLCALC, TRIG, CHOLHDL, LDLDIRECT,  in the last 72 hours Thyroid Function Tests: No results found for this basename: TSH, T4TOTAL, FREET3, T3FREE, THYROIDAB,  in the last 72 hours Anemia Panel:  Recent Labs  10/11/12 0400  VITAMINB12 474  FOLATE 10.5  FERRITIN 699*  TIBC 163*  IRON 15*  RETICCTPCT 2.1    RADIOLOGY: Dg Chest 2 View  10/07/2012   CLINICAL DATA:  Chest pain. Shortness of breast.  EXAM: CHEST  2 VIEW  COMPARISON:  01/17/2012.  FINDINGS: Mild cardiopericardial enlargement. Perihilar vascular  fullness with fissural thickening and probable Kerley lines in the lateral projection. There may be trace pleural effusions. No pneumothorax.  IMPRESSION: Cardiomegaly and pulmonary edema.   Electronically Signed   By: Tiburcio Pea   On: 10/07/2012 22:08   Dg Chest Port 1 View  10/09/2012   CLINICAL DATA:  Respiratory failure  EXAM: PORTABLE CHEST - 1 VIEW  COMPARISON:  10/08/2012  FINDINGS: Endotracheal tube has its tip 4.5 cm above the Carina. Nasogastric tube enters the stomach. Bilateral airspace density consistent with pulmonary edema persists, but is improved since yesterday. No worsening or new findings. No effusions.  IMPRESSION: Improvement in pulmonary edema pattern.   Electronically Signed   By: Paulina Fusi M.D.   On: 10/09/2012 07:40   Dg Chest Port 1 View  10/08/2012   *RADIOLOGY REPORT*  Clinical Data: Intubation  PORTABLE CHEST - 1 VIEW   Comparison: Prior radiograph from 10/07/2012  Findings: Endotracheal tube is in place with tip located 4.1 cm above the carina.  Enteric tube courses into the abdomen. Cardiomegaly is unchanged.  There has been interval worsening of diffuse pulmonary edema.  No pneumothorax.  No definite focal infiltrates are identified.  Osseous structures are unchanged.  IMPRESSION: 1.  Tip of endotracheal tube 4.1 cm above the carina. 2.  Interval worsening of pulmonary edema as compared to 10/07/2012.   Original Report Authenticated By: Rise Mu, M.D.    PHYSICAL EXAM General: intubated, sedated; opens eyes to commands.  Neck: CVP 12 no thyromegaly or thyroid nodule.  Lungs: Decreased breath sounds at bases. CV: Nondisplaced PMI.  Heart regular S1/S2, no S3/S4, no murmur.   No carotid bruit.  Normal pedal pulses.  Abdomen: Soft, nontender, no hepatosplenomegaly, no distention.  Extremities: No clubbing or cyanosis. Tr edema  TELEMETRY: Reviewed telemetry pt in NSR  ASSESSMENT AND PLAN: 41 yo with history of severe OSA, diastolic CHF, CKD admitted with acute on chronic diastolic CHF and intubated for pulmonary edema. 1. CHF: Acute on chronic diastolic CHF, EF 21-30%.  Pulmonary edema on CXR this morning improving. CVP down to 12 continue lasix. Dopamine weaned down to 2 mcg yesterday, will try to continue to wean today to keep SBP > 90. Her Cr is improving slowing now 2.4, baseline 1.9 will continue with diuresis.  - BP on the high side, would decrease dopamine back to 2 mcg/kg/min.   2. AKI on CKD: Creatinine improving now 2.4, baseline was 1.9. Suspect hemodynamically mediated (cardiorenal syndrome, hypotension). CVP 4, will continue with one more day of aggressive diuresis.  She is on dopamine trying to wean.   3. Elevated troponin: Peak at 3.21.  True NSTEMI with plaque rupture versus demand ischemia.  Apical wall motion abnormality.  She is on ASA and statin.  Right now, will have to hold off  on cath given rise in creatinine.  Heparin gtt stopped.  4. Anemia: With acute ischemia, would keep hemoglobin >/=8.  No obvious GI bleeding.  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  Hemoglobin improving 8.7. Renal started aranesp and iron.    5. Pulmonary: Definite pulmonary edema, possible CAP (beta hemolytic strep group B) Afebrile last night. Will stream line abx and change to ancef 2 gm Q 8hr for 2 days (total 7 days)     Aundria Rud NP-C 10/12/2012 7:11 AM  Patient seen and examined with Ulla Potash, NP. We discussed all aspects of the encounter. I agree with the  assessment and plan as stated above.   Much improved. Now diuresing briskly. Renal function improved.  CVP still at 12. Agree with continuing IV lasix and low-dose dopamine at least another day. Can give hydralazine as need for SBP > 170. Extubate today per CCM.   Maleea Camilo,MD 10:19 AM

## 2012-10-12 NOTE — Progress Notes (Signed)
I whole bag versed wasted down sink and flushed r/t expiration date and time  Witnessed by robin roberts rn  Carey Bullocks, [place]

## 2012-10-13 ENCOUNTER — Inpatient Hospital Stay (HOSPITAL_COMMUNITY): Payer: 59

## 2012-10-13 LAB — RENAL FUNCTION PANEL
Albumin: 3.1 g/dL — ABNORMAL LOW (ref 3.5–5.2)
Calcium: 9.8 mg/dL (ref 8.4–10.5)
GFR calc Af Amer: 29 mL/min — ABNORMAL LOW (ref >90)
GFR calc non Af Amer: 25 mL/min — ABNORMAL LOW (ref >90)
Glucose, Bld: 163 mg/dL — ABNORMAL HIGH (ref 70–99)
Phosphorus: 5.3 mg/dL — ABNORMAL HIGH (ref 2.3–4.6)
Potassium: 3.4 mEq/L — ABNORMAL LOW (ref 3.5–5.1)
Sodium: 147 mEq/L — ABNORMAL HIGH (ref 135–145)

## 2012-10-13 LAB — GLUCOSE, CAPILLARY
Glucose-Capillary: 123 mg/dL — ABNORMAL HIGH (ref 70–99)
Glucose-Capillary: 153 mg/dL — ABNORMAL HIGH (ref 70–99)
Glucose-Capillary: 152 mg/dL — ABNORMAL HIGH (ref 70–99)

## 2012-10-13 LAB — BASIC METABOLIC PANEL
BUN: 105 mg/dL — ABNORMAL HIGH (ref 6–23)
CO2: 24 mEq/L (ref 19–32)
Calcium: 10 mg/dL (ref 8.4–10.5)
Creatinine, Ser: 2.08 mg/dL — ABNORMAL HIGH (ref 0.50–1.10)
GFR calc non Af Amer: 28 mL/min — ABNORMAL LOW (ref >90)
Glucose, Bld: 285 mg/dL — ABNORMAL HIGH (ref 70–99)

## 2012-10-13 LAB — CBC
HCT: 27.7 % — ABNORMAL LOW (ref 36.0–46.0)
MCHC: 32.5 g/dL (ref 30.0–36.0)
Platelets: 322 10*3/uL (ref 150–400)
RDW: 13.7 % (ref 11.5–15.5)
WBC: 11.3 10*3/uL — ABNORMAL HIGH (ref 4.0–10.5)

## 2012-10-13 MED ORDER — HYDROCODONE-ACETAMINOPHEN 5-325 MG PO TABS
1.0000 | ORAL_TABLET | Freq: Once | ORAL | Status: AC
  Administered 2012-10-13: 1 via ORAL
  Filled 2012-10-13: qty 1

## 2012-10-13 MED ORDER — AMLODIPINE BESYLATE 5 MG PO TABS
5.0000 mg | ORAL_TABLET | Freq: Every day | ORAL | Status: DC
  Administered 2012-10-13 – 2012-10-18 (×6): 5 mg via ORAL
  Filled 2012-10-13 (×6): qty 1

## 2012-10-13 MED ORDER — IPRATROPIUM BROMIDE 0.02 % IN SOLN
0.5000 mg | RESPIRATORY_TRACT | Status: DC | PRN
  Administered 2012-10-14 – 2012-10-17 (×3): 0.5 mg via RESPIRATORY_TRACT
  Filled 2012-10-13 (×3): qty 2.5

## 2012-10-13 MED ORDER — ALBUTEROL SULFATE (5 MG/ML) 0.5% IN NEBU
2.5000 mg | INHALATION_SOLUTION | RESPIRATORY_TRACT | Status: DC | PRN
  Administered 2012-10-14 – 2012-10-18 (×4): 2.5 mg via RESPIRATORY_TRACT
  Filled 2012-10-13 (×4): qty 0.5

## 2012-10-13 MED ORDER — FUROSEMIDE 10 MG/ML IJ SOLN
80.0000 mg | Freq: Two times a day (BID) | INTRAMUSCULAR | Status: DC
  Administered 2012-10-13 – 2012-10-15 (×5): 80 mg via INTRAVENOUS
  Filled 2012-10-13 (×6): qty 8

## 2012-10-13 MED ORDER — POTASSIUM CHLORIDE 10 MEQ/50ML IV SOLN
10.0000 meq | INTRAVENOUS | Status: AC
  Administered 2012-10-13 (×3): 10 meq via INTRAVENOUS
  Filled 2012-10-13 (×3): qty 50

## 2012-10-13 MED ORDER — PANTOPRAZOLE SODIUM 40 MG PO TBEC
40.0000 mg | DELAYED_RELEASE_TABLET | Freq: Every day | ORAL | Status: DC
  Administered 2012-10-13 – 2012-10-21 (×9): 40 mg via ORAL
  Filled 2012-10-13 (×9): qty 1

## 2012-10-13 MED ORDER — FUROSEMIDE 10 MG/ML IJ SOLN
160.0000 mg | Freq: Two times a day (BID) | INTRAVENOUS | Status: DC
  Administered 2012-10-13: 160 mg via INTRAVENOUS
  Filled 2012-10-13 (×2): qty 16

## 2012-10-13 MED ORDER — INSULIN ASPART 100 UNIT/ML ~~LOC~~ SOLN
0.0000 [IU] | SUBCUTANEOUS | Status: DC
Start: 2012-10-13 — End: 2012-10-16
  Administered 2012-10-13: 7 [IU] via SUBCUTANEOUS
  Administered 2012-10-13: 11 [IU] via SUBCUTANEOUS
  Administered 2012-10-13: 7 [IU] via SUBCUTANEOUS
  Administered 2012-10-13 – 2012-10-14 (×5): 4 [IU] via SUBCUTANEOUS
  Administered 2012-10-14: 11 [IU] via SUBCUTANEOUS
  Administered 2012-10-14: 7 [IU] via SUBCUTANEOUS
  Administered 2012-10-14 (×2): 4 [IU] via SUBCUTANEOUS
  Administered 2012-10-15: 3 [IU] via SUBCUTANEOUS
  Administered 2012-10-15 (×2): 7 [IU] via SUBCUTANEOUS
  Administered 2012-10-15: 11 [IU] via SUBCUTANEOUS
  Administered 2012-10-15: 3 [IU] via SUBCUTANEOUS
  Administered 2012-10-15: 7 [IU] via SUBCUTANEOUS
  Administered 2012-10-15: 4 [IU] via SUBCUTANEOUS
  Administered 2012-10-16: 3 [IU] via SUBCUTANEOUS
  Administered 2012-10-16: 11 [IU] via SUBCUTANEOUS
  Administered 2012-10-16: 3 [IU] via SUBCUTANEOUS
  Administered 2012-10-16: 4 [IU] via SUBCUTANEOUS
  Administered 2012-10-16: 18:00:00 11 [IU] via SUBCUTANEOUS

## 2012-10-13 NOTE — Progress Notes (Signed)
Chaplain received nurse referral while on the unit. Patient was napping, but woke and was receptive to a visit. Patient was emotional and expressed feeling "umcomfortable," being unable to sleep well, and ready to go home to see significant other and sleep in her own bed. Patient has supportive relationships. Chaplain provided emotional support, compassionate presence, and active listening. Patient was appreciative of visit.  Maurene Capes, [place] [phone]

## 2012-10-13 NOTE — Progress Notes (Signed)
Patient ID: Jasmine [name], female   DOB: [birth year], 42 y.o.   MRN: [id number]  SUBJECTIVE:   Jasmine [name] is a 42 y.o [place] American female with history of nonobstructive CAD per cath 2010, HTN, uncontrolled DM and diastolic dysfunction. Admitted with respiratory failure due to PNA/CHF and a/c renal failure.  Extubated yesterday and placed on BiPap NIV for desats to 88-91%. Now on [place]. Weight down 3 lbs and 24 hr I/O -3.6 liters. Cr improving 2.2. CVP 8-10 SBP 130-150s on dopa 2. Very weak. Requires a lot of help to get to chair.    Marland Kitchen antiseptic oral rinse  15 mL Mouth Rinse QID  . aspirin  81 mg Oral Daily  . atorvastatin  40 mg Oral q1800  .  ceFAZolin (ANCEF) IV  2 g Intravenous Q8H  . chlorhexidine  15 mL Mouth Rinse BID  . [START ON 10/18/2012] darbepoetin (ARANESP) injection - NON-DIALYSIS  100 mcg Subcutaneous Q Wed-1800  . furosemide  160 mg Intravenous TID  . heparin subcutaneous  5,000 Units Subcutaneous Q8H  . insulin aspart  0-20 Units Subcutaneous Q4H  . insulin glargine  10 Units Subcutaneous Q24H  . metoCLOPramide (REGLAN) injection  5 mg Intravenous Q8H  . pantoprazole (PROTONIX) IV  40 mg Intravenous Q24H  . sodium chloride  10 mL Intracatheter Q12H  Dopamine @ 2    Filed Vitals:   10/13/12 0355 10/13/12 0400 10/13/12 0500 10/13/12 0600  BP:  107/89 134/60 114/53  Pulse:  81 95 87  Temp: 98.6 F (37 C)     TempSrc: Oral     Resp:  18 20 18   Height:      Weight:    276 lb 3.8 oz (125.3 kg)  SpO2:  100% 100% 100%    Intake/Output Summary (Last 24 hours) at 10/13/12 0705 Last data filed at 10/13/12 0600  Gross per 24 hour  Intake  986.8 ml  Output   4625 ml  Net -3638.2 ml    LABS: Basic Metabolic Panel:  Recent Labs  40/98/11 0400 10/12/12 0435 10/13/12 0415  NA 135 139 147*  K 3.8 3.5 3.4*  CL 101 99 104  CO2 19 23 29   GLUCOSE 319* 136* 163*  BUN 97* 103* 109*  CREATININE 3.41* 2.47* 2.28*  CALCIUM 8.9 9.3 9.8  PHOS 5.1*  --  5.3*   Liver  Function Tests:  Recent Labs  10/11/12 0400 10/13/12 0415  AST 12  --   ALT 11  --   ALKPHOS 117  --   BILITOT 0.2*  --   PROT 6.9  --   ALBUMIN 2.7* 3.1*   No results found for this basename: LIPASE, AMYLASE,  in the last 72 hours CBC:  Recent Labs  10/12/12 0435 10/13/12 0415  WBC 11.1* 11.3*  HGB 8.7* 9.0*  HCT 25.6* 27.7*  MCV 84.8 86.0  PLT 303 322   Cardiac Enzymes: No results found for this basename: CKTOTAL, CKMB, CKMBINDEX, TROPONINI,  in the last 72 hours BNP: No components found with this basename: POCBNP,  D-Dimer: No results found for this basename: DDIMER,  in the last 72 hours Hemoglobin A1C: No results found for this basename: HGBA1C,  in the last 72 hours Fasting Lipid Panel: No results found for this basename: CHOL, HDL, LDLCALC, TRIG, CHOLHDL, LDLDIRECT,  in the last 72 hours Thyroid Function Tests: No results found for this basename: TSH, T4TOTAL, FREET3, T3FREE, THYROIDAB,  in the last 72 hours Anemia Panel:  Recent Labs  10/11/12 0400  VITAMINB12 474  FOLATE 10.5  FERRITIN 699*  TIBC 163*  IRON 15*  RETICCTPCT 2.1    RADIOLOGY: Dg Chest 2 View  10/07/2012   CLINICAL DATA:  Chest pain. Shortness of breast.  EXAM: CHEST  2 VIEW  COMPARISON:  01/17/2012.  FINDINGS: Mild cardiopericardial enlargement. Perihilar vascular fullness with fissural thickening and probable Kerley lines in the lateral projection. There may be trace pleural effusions. No pneumothorax.  IMPRESSION: Cardiomegaly and pulmonary edema.   Electronically Signed   By: Tiburcio Pea   On: 10/07/2012 22:08   Dg Chest Port 1 View  10/09/2012   CLINICAL DATA:  Respiratory failure  EXAM: PORTABLE CHEST - 1 VIEW  COMPARISON:  10/08/2012  FINDINGS: Endotracheal tube has its tip 4.5 cm above the Carina. Nasogastric tube enters the stomach. Bilateral airspace density consistent with pulmonary edema persists, but is improved since yesterday. No worsening or new findings. No effusions.   IMPRESSION: Improvement in pulmonary edema pattern.   Electronically Signed   By: Paulina Fusi M.D.   On: 10/09/2012 07:40   Dg Chest Port 1 View  10/08/2012   *RADIOLOGY REPORT*  Clinical Data: Intubation  PORTABLE CHEST - 1 VIEW  Comparison: Prior radiograph from 10/07/2012  Findings: Endotracheal tube is in place with tip located 4.1 cm above the carina.  Enteric tube courses into the abdomen. Cardiomegaly is unchanged.  There has been interval worsening of diffuse pulmonary edema.  No pneumothorax.  No definite focal infiltrates are identified.  Osseous structures are unchanged.  IMPRESSION: 1.  Tip of endotracheal tube 4.1 cm above the carina. 2.  Interval worsening of pulmonary edema as compared to 10/07/2012.   Original Report Authenticated By: Rise Mu, M.D.    PHYSICAL EXAM General: sitting in chair on [place] Neck: CVP 6 no thyromegaly or thyroid nodule.  Lungs: Decreased breath sounds at bases. CV: Nondisplaced PMI.  Heart regular S1/S2, no S3/S4, no murmur.   No carotid bruit.  Normal pedal pulses.  Abdomen: Soft, nontender, no hepatosplenomegaly, no distention.  Extremities: No clubbing or cyanosis. Tr edema  TELEMETRY: Reviewed telemetry pt in NSR  ASSESSMENT AND PLAN: 42 yo with history of severe OSA, diastolic CHF, CKD admitted with acute on chronic diastolic CHF and intubated for pulmonary edema.  1. CHF: Acute on chronic diastolic CHF, EF 16-10%.  Extubated yesterday. CVP 8-10. Weight down 3 lbs. Will SBP 130-150s will wean dopamine off.  - Cut lasix back to 80 mg IV BID - K+ 3.4 will replace with 3 runs IV - Patient somewhat lethargic and concerned about swallowing, will try to add back home meds once able to tolerate.  2. AKI on CKD: Creatinine continues to improve, 2.2 and baseline is 1.9. Will continue to follow, but lasix back as above.  Suspect hemodynamically mediated (cardiorenal syndrome, hypotension). Will wean dopamine   3. Elevated troponin: Peak at  3.21. Suspect demand ischemia vs HF. She does ahve apical wall motion abnormality.  She is on ASA and statin. Given renal failure would not cath at this point.   4. Anemia: Hemoglobin improving 8.7. Renal started aranesp and iron.    5. Pulmonary: CAP (beta hemolytic strep group B) remains afebrile. Continue Ancef for one more day.     6. Hypernatremia/hypokalemia   Aundria Rud NP-C 10/13/2012 7:05 AM  Patient seen and examined with Ulla Potash, NP. We discussed all aspects of the encounter. I agree with the assessment and plan as  stated above. She is better today. Agree with one more day of IV lasix. Would keep in ICU one more day. Turn dopa off. Transfer to SDU possibly in am. On transfer to SDU will need to consult [place] to help with multiple issues including deconditioning and renal failure. I suspect she may need SNF. Consult PT.   Blakelee Allington,MD 1:29 PM

## 2012-10-13 NOTE — Progress Notes (Signed)
S: On Bipap.  No pain, no SOB O:BP 114/53  Pulse 86  Temp(Src) 98.6 F (37 C) (Oral)  Resp 21  Ht 5\' 6"  (1.676 m)  Wt 125.3 kg (276 lb 3.8 oz)  BMI 44.61 kg/m2  SpO2 100%  LMP 09/27/2012  Intake/Output Summary (Last 24 hours) at 10/13/12 0711 Last data filed at 10/13/12 0700  Gross per 24 hour  Intake 1006.8 ml  Output   4625 ml  Net -3618.2 ml   Weight change: -1.4 kg (-3 lb 1.4 oz) Gen: extubated, on BiPap,  CVS:RRR Resp:basilar crackles, scattered rhonchi Abd:+ BS NTND Ext:  Tr edema NEURO:  Awake and alert.  Follows commands    . antiseptic oral rinse  15 mL Mouth Rinse QID  . aspirin  81 mg Oral Daily  . atorvastatin  40 mg Oral q1800  .  ceFAZolin (ANCEF) IV  2 g Intravenous Q8H  . chlorhexidine  15 mL Mouth Rinse BID  . [START ON 10/18/2012] darbepoetin (ARANESP) injection - NON-DIALYSIS  100 mcg Subcutaneous Q Wed-1800  . furosemide  160 mg Intravenous TID  . heparin subcutaneous  5,000 Units Subcutaneous Q8H  . insulin aspart  0-20 Units Subcutaneous Q4H  . insulin glargine  10 Units Subcutaneous Q24H  . metoCLOPramide (REGLAN) injection  5 mg Intravenous Q8H  . pantoprazole (PROTONIX) IV  40 mg Intravenous Q24H  . sodium chloride  10 mL Intracatheter Q12H   Dg Chest Port 1 View  10/12/2012   CLINICAL DATA:  Pneumonia, CHF, followup  EXAM: PORTABLE CHEST - 1 VIEW  COMPARISON:  Portable chest x-ray of 10/11/2012  FINDINGS: There is no change in position of the endotracheal tube and left central venous line. There may have been some slight worsening of the degree of moderate pulmonary vascular congestion with cardiomegaly present.  IMPRESSION: 1. Apparent worsening of mild CHF. 2. Stable position of endotracheal tube and left central venous line.   Electronically Signed   By: Dwyane Dee M.D.   On: 10/12/2012 08:19   BMET    Component Value Date/Time   NA 147* 10/13/2012 0415   K 3.4* 10/13/2012 0415   CL 104 10/13/2012 0415   CO2 29 10/13/2012 0415   GLUCOSE  163* 10/13/2012 0415   BUN 109* 10/13/2012 0415   CREATININE 2.28* 10/13/2012 0415   CALCIUM 9.8 10/13/2012 0415   GFRNONAA 25* 10/13/2012 0415   GFRAA 29* 10/13/2012 0415   CBC    Component Value Date/Time   WBC 11.3* 10/13/2012 0415   RBC 3.22* 10/13/2012 0415   RBC 2.73* 10/11/2012 0400   HGB 9.0* 10/13/2012 0415   HCT 27.7* 10/13/2012 0415   PLT 322 10/13/2012 0415   MCV 86.0 10/13/2012 0415   MCH 28.0 10/13/2012 0415   MCHC 32.5 10/13/2012 0415   RDW 13.7 10/13/2012 0415   LYMPHSABS 1.0 10/07/2012 2126   MONOABS 0.9 10/07/2012 2126   EOSABS 0.3 10/07/2012 2126   BASOSABS 0.1 10/07/2012 2126     Assessment: 1. CKD 4 with volume overload, improving.  UO excellent 2. Diastolic dysfunction and probable Rt ht failure 3. Anemia  Plan: 1. Her renal function is at baseline and getting a little intravascularly dry as evidenced by Sl high SNa.  Will decrease lasix to BID and Dc zaroxalyn.  DC dopamine. Replace K.  Spoke with CHF nurse practioner and will sign off.  Call if further renal issues  Trent Gabler T

## 2012-10-13 NOTE — Progress Notes (Signed)
[facility] ADULT ICU REPLACEMENT PROTOCOL FOR AM LAB REPLACEMENT ONLY  The patient does not: apply for the [facility] Adult ICU Electrolyte Replacment Protocol based on the criteria listed below:   1. Is GFR >/= 40 ml/min? no  Patient's GFR today is 29  4. Abnormal electrolyte(s): K+ 3.4  6. If a panic level lab has been reported, has the CCM MD in charge been notified? yes.   Physician:  Dr. Higinio Plan, Mekaila Tarnow A 10/13/2012 5:35 AM

## 2012-10-13 NOTE — Evaluation (Signed)
Clinical/Bedside Swallow Evaluation Patient Details  Name: Jasmine [name] MRN: [id number] Date of Birth: 18-Sep-1970  Today's Date: 10/13/2012 Time: 1345-1430 SLP Time Calculation (min): 45 min  Past Medical History:  Past Medical History  Diagnosis Date  . Diastolic heart failure     echo 11/03/10 EF 55-60%  . Hypertension   . Chronic kidney disease (CKD)     stage II-III  . Hyperlipidemia   . Uncontrolled diabetes mellitus   . Diabetic ketoacidosis     with seizures  . Anemia   . Diabetic gastroparesis   . Obesity   . Coronary artery disease     mild per cath 2010  . CKD (chronic kidney disease), stage IV 01/20/2012   Past Surgical History:  Past Surgical History  Procedure Laterality Date  . Cholecystectomy    . Carpal tunnel release  2003   HPI:  42 years female with diastolic heart failure, CKD, HTN, DM, OSA, admitted 9/13 with sudden onset of SOB and hypoxemia. Chest X ray consistent with acute pulmonary edema. Did not tolerate BiPAP and required endotracheal intubation.    Assessment / Plan / Recommendation Clinical Impression  Patient presents with a suspected primary respiratory-related dysphagia, with secondary component of pharyngeal dysphagia characterized by discoordination of swallow, suspected decreased hyolaryngeal excursion and decreased pharyngeal contraction per palpation. Following initial coughing episode with first sip of thin liquids, no overt s/s aspiration noted. Suspect that this incident was result of patient consuming liquids too quickly, combined with discoordination of swallow and probable impact of recent extubtion.     Aspiration Risk  Moderate (mild risk when following precautions, moderate risk if not)    Diet Recommendation Dysphagia 3 (Mechanical Soft);Thin liquid   Liquid Administration via: Cup;Straw Medication Administration: Whole meds with puree Supervision: Patient able to self feed;Intermittent supervision to cue for compensatory  strategies Compensations: Slow rate;Small sips/bites Postural Changes and/or Swallow Maneuvers: Seated upright 90 degrees;Upright 30-60 min after meal Recommend (patient educated) that patient take rest breaks during PO intake secondary to decreased respiratory status  Alternate solids/liquids   Other  Recommendations Recommended Consults:  (MBSS or FEES if dysphagia persists or worsening of symptoms) Oral Care Recommendations: Oral care BID   Follow Up Recommendations  Inpatient Rehab;Home health SLP (pending progress, HH vs. CIR)    Frequency and Duration min 2x/week  2 weeks   Pertinent Vitals/Pain     SLP Swallow Goals Patient will consume recommended diet without observed clinical signs of aspiration with: Set-up;Supervision/safety Swallow Study Goal #1 - Progress:    Swallow Study Prior Functional Status       General Date of Onset: 10/07/12 HPI: 42 years female with diastolic heart failure, CKD, HTN, DM, OSA, admitted 9/13 with sudden onset of SOB and hypoxemia. Chest X ray consistent with acute pulmonary edema. Did not tolerate BiPAP and required endotracheal intubation.  Type of Study: Bedside swallow evaluation Previous Swallow Assessment: NA Diet Prior to this Study: NPO Temperature Spikes Noted: No Respiratory Status: Supplemental O2 delivered via (comment) (4L nasal cannula) History of Recent Intubation: Yes Length of Intubations (days): 5 days Date extubated: 10/12/12 Behavior/Cognition: Alert;Cooperative;Pleasant mood Oral Cavity - Dentition: Adequate natural dentition Self-Feeding Abilities: Able to feed self;Needs set up Patient Positioning: Upright in chair Baseline Vocal Quality: Hoarse (mild hoarse vocal quality) Volitional Cough: Weak Volitional Swallow: Able to elicit    Oral/Motor/Sensory Function Overall Oral Motor/Sensory Function: Appears within functional limits for tasks assessed   Ice Chips Ice chips: Not tested  Thin Liquid Thin Liquid:  Impaired Presentation: Straw;Cup;Self Fed Pharyngeal  Phase Impairments: Decreased hyoid-laryngeal movement;Cough - Immediate Other Comments: Patient exhibited immediate coughing after first cup sip of thin liquids with regurgitation of liquids. After this incident, she requested to "try again" and did not exhibit any further coughing or throat clearing with thin liquids with cup sips, or with straw sips.    Nectar Thick Nectar Thick Liquid: Not tested   Honey Thick Honey Thick Liquid: Not tested   Puree Puree: Within functional limits Presentation: Self Fed;Spoon Other Comments: No overt s/s aspiration noted   Solid   GO    Solid: Impaired Presentation: Self Fed Oral Phase Impairments: Other (comment) (reduced mastication and oral manipulation) Pharyngeal Phase Impairments: Decreased hyoid-laryngeal movement Other Comments: Patient stated that mastication and swallow of hard solid (graham cracker) "was hard to do"       Elio Forget Tarrell 10/13/2012,10:27 PM   Angela Nevin, MA, CCC-SLP [facility] Speech-Language Pathologist

## 2012-10-13 NOTE — Progress Notes (Signed)
NUTRITION FOLLOW UP  Intervention:   1. Diet advance as medically able   Nutrition Dx:   Inadequate oral intake related to inability to eat as evidenced by NPO diet.  Goal:   EN goal no longer applicable.  New Goal: Diet advance to meet >/=90% estimated nutrition needs.   Monitor:   Diet advance, po intake, weight trends, labs   Assessment:    Pt was extubated on 9/18. Urin out put has improved, creatinine improving. Likely dry at this point. Remains NPO. TF off with extubation. Discussed with RN, may need swallow eval prior to starting diet.    Height: Ht Readings from Last 1 Encounters:  10/08/12 5\' 6"  (1.676 m)    Weight Status:   Wt Readings from Last 1 Encounters:  10/13/12 276 lb 3.8 oz (125.3 kg)  Admission weight:285 lbs, down with fluid off  Re-estimated needs:  Kcal: 2100-2300 Protein: 80-90 gm  Fluid: per MD  Skin: intact   Diet Order: NPO   Intake/Output Summary (Last 24 hours) at 10/13/12 1311 Last data filed at 10/13/12 1200  Gross per 24 hour  Intake 987.68 ml  Output   5325 ml  Net -4337.32 ml    Last BM: none documented   Labs:   Recent Labs Lab 10/07/12 2119  10/11/12 0400 10/12/12 0435 10/13/12 0415  NA  --   < > 135 139 147*  K  --   < > 3.8 3.5 3.4*  CL  --   < > 101 99 104  CO2  --   < > 19 23 29   BUN  --   < > 97* 103* 109*  CREATININE  --   < > 3.41* 2.47* 2.28*  CALCIUM  --   < > 8.9 9.3 9.8  MG 2.0  --   --   --   --   PHOS  --   --  5.1*  --  5.3*  GLUCOSE  --   < > 319* 136* 163*  < > = values in this interval not displayed.  CBG (last 3)   Recent Labs  10/13/12 0329 10/13/12 0748 10/13/12 1145  GLUCAP 153* 152* 170*    Scheduled Meds: . antiseptic oral rinse  15 mL Mouth Rinse QID  . aspirin  81 mg Oral Daily  . atorvastatin  40 mg Oral q1800  .  ceFAZolin (ANCEF) IV  2 g Intravenous Q8H  . chlorhexidine  15 mL Mouth Rinse BID  . [START ON 10/18/2012] darbepoetin (ARANESP) injection - NON-DIALYSIS   100 mcg Subcutaneous Q Wed-1800  . furosemide  160 mg Intravenous Q12H  . heparin subcutaneous  5,000 Units Subcutaneous Q8H  . insulin aspart  0-20 Units Subcutaneous Q4H  . insulin glargine  10 Units Subcutaneous Q24H  . metoCLOPramide (REGLAN) injection  5 mg Intravenous Q8H  . pantoprazole (PROTONIX) IV  40 mg Intravenous Q24H  . potassium chloride  10 mEq Intravenous Q1 Hr x 3  . sodium chloride  10 mL Intracatheter Q12H    Continuous Infusions: . sodium chloride 20 mL/hr (10/12/12 0818)  . DOPamine Stopped (10/13/12 0947)    Isabell Jarvis RD, LDN Pager [phone] After Hours pager [phone]

## 2012-10-13 NOTE — Progress Notes (Signed)
PULMONARY  / CRITICAL CARE MEDICINE  Name: Jasmine [name] MRN: [id number] DOB: [birth year]    ADMISSION DATE:  10/07/2012 CONSULTATION DATE:  10/08/12  REFERRING MD :  Yaakov Guthrie, MD PRIMARY SERVICE: Cardiology  CHIEF COMPLAINT:  SOB  BRIEF PATIENT DESCRIPTION:  42 years female with diastolic heart failure, CKD, HTN, DM, OSA, admitted 9/13 with sudden onset of SOB and hypoxemia. Chest X ray consistent with acute pulmonary edema. Did not tolerate BiPAP and required endotracheal intubation.    SIGNIFICANT EVENTS: 9/15 Transfuse 1 unit PRBC 9/16  Add HCO3 gtt, Transfuse 1 unit PRBC, renal consulted  STUDIES:  Echo 9/14 >> mild LVH, EF 55 to 60%, grade 2 diastolic dysfx  LINES / TUBES: ETT 9/13>>> 9/18 R Radial aline 9/13>>> Lt IJ CVL 9/15 >>>  CULTURES: Blood 9/14 >> Sputum 9/14 >> Moderate beta hemolytic Streptococcus  ANTIBIOTICS: Rocephin 9/14>>> 9/18 Azithro 9/14>>> 9/18 Ancef 9/18 >>>   SUBJECTIVE:  Used BiPAP overnight.  Denies chest/abd pain.  Breathing better.  Still has cough with sputum.  VITAL SIGNS: Temp:  [98.2 F (36.8 C)-100.7 F (38.2 C)] 98.6 F (37 C) (09/19 0749) Pulse Rate:  [81-107] 99 (09/19 0815) Resp:  [13-30] 20 (09/19 0815) BP: (103-166)/(52-109) 110/92 mmHg (09/19 0815) SpO2:  [92 %-100 %] 100 % (09/19 0815) FiO2 (%):  [40 %] 40 % (09/19 0400) Weight:  [276 lb 3.8 oz (125.3 kg)] 276 lb 3.8 oz (125.3 kg) (09/19 0600) 4 liters [place]  HEMODYNAMICS: CVP:  [6 mmHg-9 mmHg] 6 mmHg INTAKE / OUTPUT: Intake/Output     09/18 0701 - 09/19 0700 09/19 0701 - 09/20 0700   I.V. (mL/kg) 589.6 (4.7)    NG/GT 74    IV Piggyback 348    Total Intake(mL/kg) 1011.6 (8.1)    Urine (mL/kg/hr) 4625 (1.5)    Total Output 4625     Net -3613.4            PHYSICAL EXAMINATION: General: no distress HEENT: no stridor Heart: Regular, no murmur Lungs: scattered rhonchi Abdomen: soft, non tender, + bowel sounds. Musculoskeletal: no edema Skin: No rashes   Neuro: Awake, follows commands, normal strength  LABS:  CBC Recent Labs     10/11/12  0400  10/12/12  0435  10/13/12  0415  WBC  11.7*  11.1*  11.3*  HGB  7.9*  8.7*  9.0*  HCT  23.2*  25.6*  27.7*  PLT  257  303  322   Iron Studies Lab Results  Component Value Date   IRON 15* 10/11/2012   TIBC 163* 10/11/2012   FERRITIN 699* 10/11/2012   Coag's No results found for this basename: APTT, INR,  in the last 72 hours  BMET Recent Labs     10/11/12  0400  10/12/12  0435  10/13/12  0415  NA  135  139  147*  K  3.8  3.5  3.4*  CL  101  99  104  CO2  19  23  29   BUN  97*  103*  109*  CREATININE  3.41*  2.47*  2.28*  GLUCOSE  319*  136*  163*   Electrolytes Recent Labs     10/11/12  0400  10/12/12  0435  10/13/12  0415  CALCIUM  8.9  9.3  9.8  PHOS  5.1*   --   5.3*   Sepsis Markers Recent Labs     10/11/12  0400  PROCALCITON  3.83   ABG Recent Labs  10/11/12  0443  10/12/12  1144  10/12/12  1417  PHART  7.365  7.423  7.418  PCO2ART  31.6*  36.0  38.9  PO2ART  89.2  61.0*  54.0*   Liver Enzymes Recent Labs     10/11/12  0400  10/13/12  0415  AST  12   --   ALT  11   --   ALKPHOS  117   --   BILITOT  0.2*   --   ALBUMIN  2.7*  3.1*    Cardiac Enzymes No results found for this basename: TROPONINI, PROBNP,  in the last 72 hours Glucose Recent Labs     10/12/12  1937  10/12/12  2015  10/12/12  2105  10/12/12  2343  10/13/12  0329  10/13/12  0748  GLUCAP  108*  115*  123*  170*  153*  152*    Imaging Dg Chest Port 1 View  10/13/2012   *RADIOLOGY REPORT*  Clinical Data: Evaluate congestive heart failure.  PORTABLE CHEST - 1 VIEW  Comparison: Chest radiograph 10/12/2012  Findings: Left-sided central venous catheter tip projects over the superior vena cava, unchanged.  Interval extubation.  Stable cardiac and mediastinal contours.  Slight interval improvement in diffuse bilateral interstitial pulmonary opacities.  No definite pleural  effusion or pneumothorax.  IMPRESSION: Slight interval improvement in bilateral interstitial opacities, most compatible with improving pulmonary edema.   Original Report Authenticated By: Annia Belt, M.D   Dg Chest Port 1 View  10/12/2012   CLINICAL DATA:  Pneumonia, CHF, followup  EXAM: PORTABLE CHEST - 1 VIEW  COMPARISON:  Portable chest x-ray of 10/11/2012  FINDINGS: There is no change in position of the endotracheal tube and left central venous line. There may have been some slight worsening of the degree of moderate pulmonary vascular congestion with cardiomegaly present.  IMPRESSION: 1. Apparent worsening of mild CHF. 2. Stable position of endotracheal tube and left central venous line.   Electronically Signed   By: Dwyane Dee M.D.   On: 10/12/2012 08:19    ASSESSMENT / PLAN:  PULMONARY A: Acute respiratory failure 2nd to acute pulmonary edema and pneumonia. Hx of OSA - non compliant with CPAP as outpt. P:   -oxygen to keep SpO2 > 92% -f/u CXR as needed -continue qhs BiPAP and prn during the day -bronchial hygiene  CARDIOVASCULAR A:  Acute on chronic diastolic CHF. NSTEMI. Hypotension >> likely from diuresis, anemia, and meds >> improved. P:  -continue ASA, lipitor -using DA to maintain hemodynamics  RENAL A:   Acute on chronic renal failure >> hypotension, hypoxia, anemia, and diuresis. Non gap metabolic acidosis >> improved. Stage 4 CKD. P:   -monitor renal fx, urine outpt, electrolytes -lasix per renal, cardiology  GASTROINTESTINAL A:   Nutrition. DM gastroparesis. P:   -assess swallowing after extubation, and then advance diet per primary team -added reglan 9/16 -protonix for SUP  HEMATOLOGIC A:   Anemia >> no obvious bleeding. P:  -f/u CBC -transfuse for Hb < 8 in setting of possible ACS -aranesp per renal  INFECTIOUS A:   CAP with beta hemolytic Streptococcus in sputum. P:  -Day 6 abx, changed to ancef 9/18  ENDOCRINE A:   DM type II with  hyperglycemia. P:   -SSI  NEUROLOGIC A:   Pain control. Deconditioning. P:   -prn fentanyl -will need PT assessment  PCCM will be available as needed over weekend.  Will follow up on Monday Sept 22 >> call  if help needed sooner.  Coralyn Helling, MD [facility] Pulmonary/Critical Care 10/13/2012, 9:10 AM Pager:  [phone] After 3pm call: [phone]

## 2012-10-13 NOTE — Progress Notes (Signed)
Pt taken off BIPAP and placed on 4lpm [place]. Clear, diminished bbs, pt has productive cough. Pt tolerating being off BIPAP at this time. RT will continue to monitor.

## 2012-10-13 NOTE — Progress Notes (Signed)
eLink Physician-Brief Progress Note Patient Name: Patricia L Vanegas DOB: [birth year] MRN: [id number]  Date of Service  10/13/2012   HPI/Events of Note  1. Hyperglycemia - transitioned to Lantuss with SSI but blood sugars are increasing. 2. Extubated to BiPAP with some wheezing noted when patient is taken off BiPAP - currently being diuresed.   eICU Interventions  1. Placed on q4 hour SSI resistant scale 2. PRN Albuterol and Atrovent nebs ordered.   Intervention Category Intermediate Interventions: Respiratory distress - evaluation and management;Hyperglycemia - evaluation and treatment  Breelynn Bankert 10/13/2012, 12:03 AM

## 2012-10-14 LAB — GLUCOSE, CAPILLARY
Glucose-Capillary: 210 mg/dL — ABNORMAL HIGH (ref 70–99)
Glucose-Capillary: 269 mg/dL — ABNORMAL HIGH (ref 70–99)
Glucose-Capillary: 214 mg/dL — ABNORMAL HIGH (ref 70–99)
Glucose-Capillary: 257 mg/dL — ABNORMAL HIGH (ref 70–99)

## 2012-10-14 MED ORDER — MORPHINE SULFATE 2 MG/ML IJ SOLN
2.0000 mg | INTRAMUSCULAR | Status: DC | PRN
  Administered 2012-10-14 – 2012-10-15 (×3): 4 mg via INTRAVENOUS
  Filled 2012-10-14 (×3): qty 2

## 2012-10-14 MED ORDER — HYDRALAZINE HCL 10 MG PO TABS
10.0000 mg | ORAL_TABLET | Freq: Four times a day (QID) | ORAL | Status: DC
  Administered 2012-10-14 – 2012-10-16 (×8): 10 mg via ORAL
  Filled 2012-10-14 (×12): qty 1

## 2012-10-14 MED ORDER — ACETAMINOPHEN 325 MG PO TABS
650.0000 mg | ORAL_TABLET | Freq: Four times a day (QID) | ORAL | Status: DC | PRN
  Administered 2012-10-14: 650 mg via ORAL
  Filled 2012-10-14: qty 2

## 2012-10-14 NOTE — Progress Notes (Signed)
Patient ID: Jasmine [name], female   DOB: [birth year], 42 y.o.   MRN: [id number]  SUBJECTIVE:   Jasmine [name] is a 42 y.o [place] American female with history of nonobstructive CAD per cath 2010, HTN, uncontrolled DM and diastolic dysfunction. Admitted with respiratory failure due to PNA/CHF and a/c renal failure.  Extubated 9/18  Still with cough  Breathing easier   . amLODipine  5 mg Oral Daily  . aspirin  81 mg Oral Daily  . atorvastatin  40 mg Oral q1800  .  ceFAZolin (ANCEF) IV  2 g Intravenous Q8H  . chlorhexidine  15 mL Mouth Rinse BID  . [START ON 10/18/2012] darbepoetin (ARANESP) injection - NON-DIALYSIS  100 mcg Subcutaneous Q Wed-1800  . furosemide  80 mg Intravenous Q12H  . heparin subcutaneous  5,000 Units Subcutaneous Q8H  . insulin aspart  0-20 Units Subcutaneous Q4H  . insulin glargine  10 Units Subcutaneous Q24H  . metoCLOPramide (REGLAN) injection  5 mg Intravenous Q8H  . pantoprazole  40 mg Oral QHS  . sodium chloride  10 mL Intracatheter Q12H      Filed Vitals:   10/14/12 0500 10/14/12 0600 10/14/12 0700 10/14/12 0741  BP: 146/58 152/121 164/49   Pulse: 93 87 88   Temp:    98.5 F (36.9 C)  TempSrc:    Oral  Resp: 21 21 23    Height:      Weight:   267 lb 6.7 oz (121.3 kg)   SpO2: 99% 100% 99%     Intake/Output Summary (Last 24 hours) at 10/14/12 1117 Last data filed at 10/14/12 0700  Gross per 24 hour  Intake    760 ml  Output   4080 ml  Net  -3320 ml    LABS: Basic Metabolic Panel:  Recent Labs  86/57/84 0415 10/13/12 1530  NA 147* 136  K 3.4* 3.5  CL 104 94*  CO2 29 24  GLUCOSE 163* 285*  BUN 109* 105*  CREATININE 2.28* 2.08*  CALCIUM 9.8 10.0  PHOS 5.3*  --    Liver Function Tests:  Recent Labs  10/13/12 0415  ALBUMIN 3.1*   CBC:  Recent Labs  10/12/12 0435 10/13/12 0415  WBC 11.1* 11.3*  HGB 8.7* 9.0*  HCT 25.6* 27.7*  MCV 84.8 86.0  PLT 303 322    RADIOLOGY: Dg Chest 2 View  10/07/2012   CLINICAL DATA:  Chest  pain. Shortness of breast.  EXAM: CHEST  2 VIEW  COMPARISON:  01/17/2012.  FINDINGS: Mild cardiopericardial enlargement. Perihilar vascular fullness with fissural thickening and probable Kerley lines in the lateral projection. There may be trace pleural effusions. No pneumothorax.  IMPRESSION: Cardiomegaly and pulmonary edema.   Electronically Signed   By: Tiburcio Pea   On: 10/07/2012 22:08   Dg Chest Port 1 View  10/09/2012   CLINICAL DATA:  Respiratory failure  EXAM: PORTABLE CHEST - 1 VIEW  COMPARISON:  10/08/2012  FINDINGS: Endotracheal tube has its tip 4.5 cm above the Carina. Nasogastric tube enters the stomach. Bilateral airspace density consistent with pulmonary edema persists, but is improved since yesterday. No worsening or new findings. No effusions.  IMPRESSION: Improvement in pulmonary edema pattern.   Electronically Signed   By: Paulina Fusi M.D.   On: 10/09/2012 07:40   Dg Chest Port 1 View  10/08/2012   *RADIOLOGY REPORT*  Clinical Data: Intubation  PORTABLE CHEST - 1 VIEW  Comparison: Prior radiograph from 10/07/2012  Findings: Endotracheal tube is in place  with tip located 4.1 cm above the carina.  Enteric tube courses into the abdomen. Cardiomegaly is unchanged.  There has been interval worsening of diffuse pulmonary edema.  No pneumothorax.  No definite focal infiltrates are identified.  Osseous structures are unchanged.  IMPRESSION: 1.  Tip of endotracheal tube 4.1 cm above the carina. 2.  Interval worsening of pulmonary edema as compared to 10/07/2012.   Original Report Authenticated By: Rise Mu, M.D.    PHYSICAL EXAM General: sitting in chair on [place]: CVP 6 no thyromegaly or thyroid nodule.  Lungs: Decreased breath sounds at bases. CV: Nondisplaced PMI.  Heart regular S1/S2, no S3/S4, no murmur.   No carotid bruit.  Normal pedal pulses.  Abdomen: Soft, nontender, no hepatosplenomegaly, no distention.  Extremities: No clubbing or cyanosis. Tr  edema  TELEMETRY: Reviewed telemetry pt in NSR  ASSESSMENT AND PLAN: 42 yo with history of severe OSA, diastolic CHF, CKD admitted with acute on chronic diastolic CHF and intubated for pulmonary edema.  1. CHF:  Continue bid iv lasix.  Add hydralazine for BP control  D/C CVP's and foley  2. AKI on CKD: Creatinine continues to improve, 2.08  and baseline is 1.9. Will continue to follow, but lasix back as above.  Suspect hemodynamically mediated (cardiorenal syndrome, hypotension). Dopamine d/c  3. Elevated troponin: Peak at 3.21. Suspect demand ischemia vs HF. She does ahve apical wall motion abnormality.  She is on ASA and statin. Given renal failure would not cath at this point.   4. Anemia: Hemoglobin improving 8.7. Renal started aranesp and iron.    5. Pulmonary: CAP (beta hemolytic strep group B) remains afebrile. Continue Ancef  Control cough and IS   6. Hypernatremia/hypokalemia   Charlton Haws NP-C 10/14/2012 11:17 AM

## 2012-10-14 NOTE — Progress Notes (Signed)
Resp Care Note;Pt refusing CPAP at this time,states she can't sleep with it on.

## 2012-10-14 NOTE — Evaluation (Signed)
Physical Therapy Evaluation Patient Details Name: Jasmine [name] MRN: [id number] DOB: [birth year] Today's Date: 10/14/2012 Time: 4098-1191 PT Time Calculation (min): 27 min  PT Assessment / Plan / Recommendation History of Present Illness  42 years female with diastolic heart failure, CKD, HTN, DM, OSA, admitted 9/13 with sudden onset of SOB and hypoxemia. Chest X ray consistent with acute pulmonary edema. Did not tolerate BiPAP and required endotracheal intubation.   Clinical Impression  Pt admitted with the above. Pt currently with functional limitations due to the deficits listed below (see PT Problem List). Pt very motivated and willing to work with therapy.  Pt ultimate goal to to return to independence and to work.  Pt will benefit from skilled PT to increase their independence and safety with mobility to allow discharge to the venue listed below. Highly recommend CIR to improve overall independence prior to d/c home.      PT Assessment  Patient needs continued PT services    Follow Up Recommendations  CIR    Equipment Recommendations  Rolling walker with 5" wheels    Recommendations for Other Services Rehab consult   Frequency Min 3X/week    Precautions / Restrictions Precautions Precautions: Fall   Pertinent Vitals/Pain C/o mild back pain but does not rate      Mobility  Bed Mobility Bed Mobility: Supine to Sit;Sitting - Scoot to Edge of Bed Supine to Sit: 4: Min assist;With rails;HOB elevated Details for Bed Mobility Assistance: (A) to elevate trunk OOB Transfers Transfers: Sit to Stand;Stand to Sit Sit to Stand: 1: +2 Total assist;From bed Sit to Stand: Patient Percentage: 60% Stand to Sit: To chair/3-in-1;1: +2 Total assist Stand to Sit: Patient Percentage: 60% Details for Transfer Assistance: (A) to initiate transfer and slowly descend to recliner with cues for hand placement Ambulation/Gait Ambulation/Gait Assistance: 1: +2 Total assist Ambulation/Gait:  Patient Percentage: 60% Ambulation Distance (Feet): 4 Feet (side steps to recliner) Assistive device: Rolling walker Ambulation/Gait Assistance Details: (A) to maintain balance due to bilateral knee buckling and tremors due to overall fatigue.  Max cues for proper step sequence.  Gait Pattern: Step-to pattern;Decreased stride length;Trunk flexed Gait velocity: decreased Stairs: No    Exercises General Exercises - Lower Extremity Ankle Circles/Pumps: AAROM;Both;10 reps Gluteal Sets: Strengthening;10 reps Long Arc Quad: Strengthening;Both;5 reps Hip ABduction/ADduction: Strengthening;Both;10 reps Straight Leg Raises: Strengthening;Both;10 reps Hip Flexion/Marching: Strengthening;Both;10 reps;Seated   PT Diagnosis: Difficulty walking;Generalized weakness  PT Problem List: Decreased strength;Decreased activity tolerance;Decreased balance;Decreased mobility;Decreased knowledge of use of DME PT Treatment Interventions: DME instruction;Gait training;Stair training;Functional mobility training;Therapeutic activities;Therapeutic exercise;Balance training;Patient/family education     PT Goals(Current goals can be found in the care plan section) Acute Rehab PT Goals Patient Stated Goal: To get stronger to be able to return to work PT Goal Formulation: With patient Time For Goal Achievement: 10/21/12 Potential to Achieve Goals: Good  Visit Information  Last PT Received On: 10/14/12 History of Present Illness: 42 years female with diastolic heart failure, CKD, HTN, DM, OSA, admitted 9/13 with sudden onset of SOB and hypoxemia. Chest X ray consistent with acute pulmonary edema. Did not tolerate BiPAP and required endotracheal intubation.        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 Communication Communication: No difficulties Dominant Hand: Right    Cognition  Cognition Arousal/Alertness: Awake/alert Behavior During Therapy: WFL for tasks assessed/performed Overall Cognitive Status: Within Functional Limits for tasks assessed    Extremity/Trunk Assessment Lower Extremity Assessment Lower Extremity Assessment: Generalized weakness (Noticeable tremors in standing due to fatigue)   Balance Balance Balance Assessed: Yes Static Sitting Balance Static Sitting - Balance Support: Feet supported Static Sitting - Level of Assistance: 4: Min assist;5: Stand by assistance Static Sitting - Comment/# of Minutes: Initial min (A) due to dizziness and describe as room spinning however able to progress to supervision when dizziness decreased Static Standing Balance Static Standing - Balance Support: Bilateral upper extremity supported Static Standing - Level of Assistance: 1: +2 Total assist;Patient percentage (comment) Static Standing - Comment/# of Minutes: (A) to maintain balance due to bilateral knee buckling  End of Session PT - End of Session Equipment Utilized During Treatment: Gait belt Activity Tolerance: Patient tolerated treatment well Patient left: in chair;with call bell/phone within reach Nurse Communication: Mobility status  GP     Sareen Randon 10/14/2012, 11:05 AM  Jake Shark, PT DPT [phone]

## 2012-10-15 LAB — GLUCOSE, CAPILLARY
Glucose-Capillary: 173 mg/dL — ABNORMAL HIGH (ref 70–99)
Glucose-Capillary: 209 mg/dL — ABNORMAL HIGH (ref 70–99)
Glucose-Capillary: 224 mg/dL — ABNORMAL HIGH (ref 70–99)
Glucose-Capillary: 258 mg/dL — ABNORMAL HIGH (ref 70–99)

## 2012-10-15 MED ORDER — BIOTENE DRY MOUTH MT LIQD
15.0000 mL | Freq: Two times a day (BID) | OROMUCOSAL | Status: DC
  Administered 2012-10-16 – 2012-10-21 (×6): 15 mL via OROMUCOSAL
  Filled 2012-10-15: qty 15

## 2012-10-15 MED ORDER — HYDROCODONE-ACETAMINOPHEN 5-325 MG PO TABS
1.0000 | ORAL_TABLET | ORAL | Status: DC | PRN
  Administered 2012-10-16 – 2012-10-21 (×12): 2 via ORAL
  Filled 2012-10-15 (×12): qty 2

## 2012-10-15 NOTE — Progress Notes (Signed)
Patient ID: Jasmine [name], female   DOB: 1970/06/15, 42 y.o.   MRN: [id number]  SUBJECTIVE:   [name] is a 42 y.o [place] American female with history of nonobstructive CAD per cath 2010, HTN, uncontrolled DM and diastolic dysfunction. Admitted with respiratory failure due to PNA/CHF and a/c renal failure.  Extubated 9/18  Still with cough  Breathing easier   Legs less edematous   . amLODipine  5 mg Oral Daily  . aspirin  81 mg Oral Daily  . atorvastatin  40 mg Oral q1800  . chlorhexidine  15 mL Mouth Rinse BID  . [START ON 10/18/2012] darbepoetin (ARANESP) injection - NON-DIALYSIS  100 mcg Subcutaneous Q Wed-1800  . furosemide  80 mg Intravenous Q12H  . heparin subcutaneous  5,000 Units Subcutaneous Q8H  . hydrALAZINE  10 mg Oral Q6H  . insulin aspart  0-20 Units Subcutaneous Q4H  . insulin glargine  10 Units Subcutaneous Q24H  . metoCLOPramide (REGLAN) injection  5 mg Intravenous Q8H  . pantoprazole  40 mg Oral QHS  . sodium chloride  10 mL Intracatheter Q12H      Filed Vitals:   10/15/12 0600 10/15/12 0605 10/15/12 0750 10/15/12 0800  BP:  152/68  160/73  Pulse: 85   84  Temp:   98.1 F (36.7 C)   TempSrc:   Oral   Resp: 24   26  Height:      Weight:  265 lb 10.5 oz (120.5 kg)    SpO2: 97%   98%    Intake/Output Summary (Last 24 hours) at 10/15/12 1048 Last data filed at 10/15/12 0900  Gross per 24 hour  Intake   1490 ml  Output   2350 ml  Net   -860 ml    LABS: Basic Metabolic Panel:  Recent Labs  57/84/69 0415 10/13/12 1530  NA 147* 136  K 3.4* 3.5  CL 104 94*  CO2 29 24  GLUCOSE 163* 285*  BUN 109* 105*  CREATININE 2.28* 2.08*  CALCIUM 9.8 10.0  PHOS 5.3*  --    Liver Function Tests:  Recent Labs  10/13/12 0415  ALBUMIN 3.1*   CBC:  Recent Labs  10/13/12 0415  WBC 11.3*  HGB 9.0*  HCT 27.7*  MCV 86.0  PLT 322    RADIOLOGY: Dg Chest 2 View  10/07/2012   CLINICAL DATA:  Chest pain. Shortness of breast.  EXAM: CHEST  2 VIEW   COMPARISON:  01/17/2012.  FINDINGS: Mild cardiopericardial enlargement. Perihilar vascular fullness with fissural thickening and probable Kerley lines in the lateral projection. There may be trace pleural effusions. No pneumothorax.  IMPRESSION: Cardiomegaly and pulmonary edema.   Electronically Signed   By: Tiburcio Pea   On: 10/07/2012 22:08   Dg Chest Port 1 View  10/09/2012   CLINICAL DATA:  Respiratory failure  EXAM: PORTABLE CHEST - 1 VIEW  COMPARISON:  10/08/2012  FINDINGS: Endotracheal tube has its tip 4.5 cm above the Carina. Nasogastric tube enters the stomach. Bilateral airspace density consistent with pulmonary edema persists, but is improved since yesterday. No worsening or new findings. No effusions.  IMPRESSION: Improvement in pulmonary edema pattern.   Electronically Signed   By: Paulina Fusi M.D.   On: 10/09/2012 07:40   Dg Chest Port 1 View  10/08/2012   *RADIOLOGY REPORT*  Clinical Data: Intubation  PORTABLE CHEST - 1 VIEW  Comparison: Prior radiograph from 10/07/2012  Findings: Endotracheal tube is in place with tip located 4.1  cm above the carina.  Enteric tube courses into the abdomen. Cardiomegaly is unchanged.  There has been interval worsening of diffuse pulmonary edema.  No pneumothorax.  No definite focal infiltrates are identified.  Osseous structures are unchanged.  IMPRESSION: 1.  Tip of endotracheal tube 4.1 cm above the carina. 2.  Interval worsening of pulmonary edema as compared to 10/07/2012.   Original Report Authenticated By: Rise Mu, M.D.    PHYSICAL EXAM General: sitting in chair on [place]: CVP 6 no thyromegaly or thyroid nodule.  Lungs: Decreased breath sounds at bases. CV: Nondisplaced PMI.  Heart regular S1/S2, no S3/S4, no murmur.   No carotid bruit.  Normal pedal pulses.  Abdomen: Soft, nontender, no hepatosplenomegaly, no distention.  Extremities: No clubbing or cyanosis. Tr edema  TELEMETRY: Reviewed telemetry pt in NSR  ASSESSMENT AND  PLAN: 42 yo with history of severe OSA, diastolic CHF, CKD admitted with acute on chronic diastolic CHF and intubated for pulmonary edema.  1. CHF:  Continue bid iv lasix.  Change to PO in am  Foley and CVP measurments d/c  RIJ is not well bandaged and concerned about sterility Would d/c in am and start peripheral i.v.   2. AKI on CKD: Creatinine continues to improve, 2.08  and baseline is 1.9. Will continue to follow, but lasix back as above.  Suspect hemodynamically mediated (cardiorenal syndrome, hypotension). Dopamine d/c  3. Elevated troponin: Peak at 3.21. Suspect demand ischemia vs HF. She does ahve apical wall motion abnormality.  She is on ASA and statin. Given renal failure would not cath at this point.   4. Anemia: Hemoglobin improving 8.7. Renal started aranesp and iron.    5. Pulmonary: CAP (beta hemolytic strep group B) remains afebrile. D/C Ancef  Control cough and IS   6. Hypernatremia/hypokalemia   Charlton Haws NP-C 10/15/2012 10:48 AM

## 2012-10-15 NOTE — Progress Notes (Signed)
Resp Care Note;Pt refusing CPAP at this time.

## 2012-10-16 DIAGNOSIS — J154 Pneumonia due to other streptococci: Secondary | ICD-10-CM | POA: Diagnosis present

## 2012-10-16 LAB — BASIC METABOLIC PANEL
Chloride: 93 mEq/L — ABNORMAL LOW (ref 96–112)
GFR calc Af Amer: 31 mL/min — ABNORMAL LOW (ref >90)
Glucose, Bld: 153 mg/dL — ABNORMAL HIGH (ref 70–99)
Potassium: 2.9 mEq/L — ABNORMAL LOW (ref 3.5–5.1)
Sodium: 133 mEq/L — ABNORMAL LOW (ref 135–145)

## 2012-10-16 LAB — CULTURE, BLOOD (ROUTINE X 2): Culture: NO GROWTH

## 2012-10-16 LAB — CBC
HCT: 30 % — ABNORMAL LOW (ref 36.0–46.0)
Hemoglobin: 10 g/dL — ABNORMAL LOW (ref 12.0–15.0)
MCV: 87.2 fL (ref 78.0–100.0)
Platelets: 376 10*3/uL (ref 150–400)
RBC: 3.44 MIL/uL — ABNORMAL LOW (ref 3.87–5.11)
RDW: 13.8 % (ref 11.5–15.5)
WBC: 14.6 10*3/uL — ABNORMAL HIGH (ref 4.0–10.5)

## 2012-10-16 LAB — GLUCOSE, CAPILLARY
Glucose-Capillary: 257 mg/dL — ABNORMAL HIGH (ref 70–99)
Glucose-Capillary: 146 mg/dL — ABNORMAL HIGH (ref 70–99)
Glucose-Capillary: 140 mg/dL — ABNORMAL HIGH (ref 70–99)
Glucose-Capillary: 289 mg/dL — ABNORMAL HIGH (ref 70–99)
Glucose-Capillary: 279 mg/dL — ABNORMAL HIGH (ref 70–99)

## 2012-10-16 LAB — URINALYSIS, ROUTINE W REFLEX MICROSCOPIC
Hgb urine dipstick: NEGATIVE
Ketones, ur: NEGATIVE mg/dL
Leukocytes, UA: NEGATIVE
Nitrite: NEGATIVE
Protein, ur: 30 mg/dL — AB
pH: 5 (ref 5.0–8.0)

## 2012-10-16 MED ORDER — BENZONATATE 100 MG PO CAPS
200.0000 mg | ORAL_CAPSULE | Freq: Once | ORAL | Status: AC
  Administered 2012-10-16: 200 mg via ORAL
  Filled 2012-10-16: qty 2

## 2012-10-16 MED ORDER — INSULIN ASPART 100 UNIT/ML ~~LOC~~ SOLN
0.0000 [IU] | Freq: Three times a day (TID) | SUBCUTANEOUS | Status: DC
  Administered 2012-10-17: 11 [IU] via SUBCUTANEOUS
  Administered 2012-10-17: 4 [IU] via SUBCUTANEOUS
  Administered 2012-10-17: 15 [IU] via SUBCUTANEOUS
  Administered 2012-10-18: 11 [IU] via SUBCUTANEOUS
  Administered 2012-10-18: 13:00:00 7 [IU] via SUBCUTANEOUS
  Administered 2012-10-18: 20 [IU] via SUBCUTANEOUS
  Administered 2012-10-19: 06:00:00 11 [IU] via SUBCUTANEOUS
  Administered 2012-10-19: 12:00:00 7 [IU] via SUBCUTANEOUS

## 2012-10-16 MED ORDER — HYDRALAZINE HCL 25 MG PO TABS
25.0000 mg | ORAL_TABLET | Freq: Four times a day (QID) | ORAL | Status: DC
  Administered 2012-10-16 – 2012-10-22 (×23): 25 mg via ORAL
  Filled 2012-10-16 (×28): qty 1

## 2012-10-16 MED ORDER — TORSEMIDE 20 MG PO TABS
60.0000 mg | ORAL_TABLET | Freq: Two times a day (BID) | ORAL | Status: DC
  Administered 2012-10-16 (×2): 60 mg via ORAL
  Filled 2012-10-16 (×6): qty 3

## 2012-10-16 MED ORDER — POTASSIUM CHLORIDE CRYS ER 20 MEQ PO TBCR
40.0000 meq | EXTENDED_RELEASE_TABLET | Freq: Three times a day (TID) | ORAL | Status: AC
  Administered 2012-10-16 (×3): 40 meq via ORAL
  Filled 2012-10-16 (×3): qty 2

## 2012-10-16 NOTE — Progress Notes (Signed)
Physical Therapy Treatment Patient Details Name: Jasmine [name] MRN: [id number] DOB: 06/07/1970 Today's Date: 10/16/2012 Time: 1308-6578 PT Time Calculation (min): 26 min  PT Assessment / Plan / Recommendation  History of Present Illness 42 years female with diastolic heart failure, CKD, HTN, DM, OSA, admitted 9/13 with sudden onset of SOB and hypoxemia. Chest X ray consistent with acute pulmonary edema. Did not tolerate BiPAP and required endotracheal intubation.    PT Comments   Pt with excellent progress today able to ambulate the unit and stairs without physical assist. Pt reports feeling unsteady on her feet with 2 periods of partial unsteadiness in standing but corrected without assist. Will continue to follow.   Follow Up Recommendations  Home health PT     Does the patient have the potential to tolerate intense rehabilitation     Barriers to Discharge        Equipment Recommendations  Rolling walker with 5" wheels    Recommendations for Other Services    Frequency     Progress towards PT Goals Progress towards PT goals: Goals met and updated - see care plan  Plan Discharge plan needs to be updated    Precautions / Restrictions Precautions Precautions: Fall   Pertinent Vitals/Pain 100 HR sats 98% RA Sore legs end of session 4/10   Mobility  Bed Mobility Bed Mobility: Supine to Sit Supine to Sit: 6: Modified independent (Device/Increase time);HOB flat;With rails Details for Bed Mobility Assistance: increased time Transfers Sit to Stand: From bed;From chair/3-in-1;4: Min guard;With armrests Stand to Sit: 4: Min guard;To chair/3-in-1;With armrests Details for Transfer Assistance: cueing for safety and hand placement Ambulation/Gait Ambulation/Gait Assistance: 4: Min guard Ambulation Distance (Feet): 300 Feet Assistive device: Rolling walker Ambulation/Gait Assistance Details: cueing for posture, position in RW and breathing technique. Pt with seated rest prior to  and after stair ambulation due to fatigue Gait Pattern: Step-through pattern;Decreased stride length;Trunk flexed Gait velocity: decreased Stairs: Yes Stairs Assistance: 4: Min guard Stair Management Technique: Step to pattern;One rail Right;Forwards Number of Stairs: 13    Exercises     PT Diagnosis:    PT Problem List:   PT Treatment Interventions:     PT Goals (current goals can now be found in the care plan section) Acute Rehab PT Goals Time For Goal Achievement: 10/30/12  Visit Information  Last PT Received On: 10/16/12 Assistance Needed: +1 History of Present Illness: 42 years female with diastolic heart failure, CKD, HTN, DM, OSA, admitted 9/13 with sudden onset of SOB and hypoxemia. Chest X ray consistent with acute pulmonary edema. Did not tolerate BiPAP and required endotracheal intubation.     Subjective Data      Cognition  Cognition Arousal/Alertness: Awake/alert Behavior During Therapy: WFL for tasks assessed/performed Overall Cognitive Status: Within Functional Limits for tasks assessed    Balance     End of Session PT - End of Session Equipment Utilized During Treatment: Gait belt Activity Tolerance: Patient tolerated treatment well Patient left: in chair;with call bell/phone within reach Nurse Communication: Mobility status   GP     [name] 10/16/2012, 10:04 AM Delaney Meigs, PT [phone]

## 2012-10-16 NOTE — Progress Notes (Signed)
Pt encouraged to wear CPAP as long as tolerated tonight. Explained it may take some time to get used to wearing CPAP while sleeping. Encouraged pt to continuing trying to wear. Advised pt to contact home health company about ordering a different mask for home use.

## 2012-10-16 NOTE — Care Management Note (Signed)
    Page 1 of 1   10/16/2012     11:55:40 AM   CARE MANAGEMENT NOTE 10/16/2012  Patient:  Jasmine [name], Jasmine [name]   Account Number:  [id number]  Date Initiated:  10/08/2012  Documentation initiated by:  [facility]  Subjective/Objective Assessment:   Sudden SOB and CP - required intubation.     DC Planning Services  CM consult      Choice offered to / List presented to:  C-1 Patient   HH arranged  HH-1 RN  HH-2 PT      HH agency  [facility]   Per UR Regulation:  Reviewed for med. necessity/level of care/duration of stay  Comments:  Contact:  Dennis,Cedric Significant other   [phone]  10/16/12 1149 Jasmine Crumpacker RN MSN BSN CCM PT recommends home therapy and pt will also need home health RN for heart failure program.  List of agencies provided, pt prefers [facility].  TC to [facility] liaison with referral.

## 2012-10-16 NOTE — Progress Notes (Addendum)
Patient ID: Niyanna L Brunke, female   DOB: 17-Mar-1970, 42 y.o.   MRN: [id number]  SUBJECTIVE:   Ms. Reisen is a 42 y.o [place] American female with history of nonobstructive CAD per cath 2010, HTN, uncontrolled DM and diastolic dysfunction. Admitted with respiratory failure due to PNA/CHF and a/c renal failure.  Extubated 9/18  Still with cough  Breathing easier. Off dopamine 10/13/12.  Renal 10/13/12 Speech evaluation completed with recommendations for  dysphagia 3. PT recommendation for CIR.  Overall weight down 19 pounds.   Sitting in chair. Feels much better. Just walked all around unit an up stairs. Complains of cough and fatigue.      Marland Kitchen amLODipine  5 mg Oral Daily  . antiseptic oral rinse  15 mL Mouth Rinse BID  . aspirin  81 mg Oral Daily  . atorvastatin  40 mg Oral q1800  . [START ON 10/18/2012] darbepoetin (ARANESP) injection - NON-DIALYSIS  100 mcg Subcutaneous Q Wed-1800  . furosemide  80 mg Intravenous Q12H  . heparin subcutaneous  5,000 Units Subcutaneous Q8H  . hydrALAZINE  10 mg Oral Q6H  . insulin aspart  0-20 Units Subcutaneous Q4H  . insulin glargine  10 Units Subcutaneous Q24H  . metoCLOPramide (REGLAN) injection  5 mg Intravenous Q8H  . pantoprazole  40 mg Oral QHS  . sodium chloride  10 mL Intracatheter Q12H      Filed Vitals:   10/15/12 2354 10/16/12 0422 10/16/12 0500 10/16/12 0735  BP: 155/66 149/64    Pulse: 96     Temp: 98.4 F (36.9 C) 98.2 F (36.8 C)  98.1 F (36.7 C)  TempSrc: Oral Oral  Oral  Resp: 14 17    Height:      Weight:   266 lb 1.6 oz (120.702 kg)   SpO2: 96% 97%      Intake/Output Summary (Last 24 hours) at 10/16/12 0748 Last data filed at 10/16/12 0700  Gross per 24 hour  Intake    900 ml  Output   1200 ml  Net   -300 ml    LABS: Basic Metabolic Panel:  Recent Labs  40/98/11 1530  NA 136  K 3.5  CL 94*  CO2 24  GLUCOSE 285*  BUN 105*  CREATININE 2.08*  CALCIUM 10.0   Liver Function Tests: No results found for  this basename: AST, ALT, ALKPHOS, BILITOT, PROT, ALBUMIN,  in the last 72 hours CBC: No results found for this basename: WBC, NEUTROABS, HGB, HCT, MCV, PLT,  in the last 72 hours  RADIOLOGY: Dg Chest 2 View  10/07/2012   CLINICAL DATA:  Chest pain. Shortness of breast.  EXAM: CHEST  2 VIEW  COMPARISON:  01/17/2012.  FINDINGS: Mild cardiopericardial enlargement. Perihilar vascular fullness with fissural thickening and probable Kerley lines in the lateral projection. There may be trace pleural effusions. No pneumothorax.  IMPRESSION: Cardiomegaly and pulmonary edema.   Electronically Signed   By: Tiburcio Pea   On: 10/07/2012 22:08   Dg Chest Port 1 View  10/09/2012   CLINICAL DATA:  Respiratory failure  EXAM: PORTABLE CHEST - 1 VIEW  COMPARISON:  10/08/2012  FINDINGS: Endotracheal tube has its tip 4.5 cm above the Carina. Nasogastric tube enters the stomach. Bilateral airspace density consistent with pulmonary edema persists, but is improved since yesterday. No worsening or new findings. No effusions.  IMPRESSION: Improvement in pulmonary edema pattern.   Electronically Signed   By: Paulina Fusi M.D.   On: 10/09/2012 07:40  Dg Chest Port 1 View  10/08/2012   *RADIOLOGY REPORT*  Clinical Data: Intubation  PORTABLE CHEST - 1 VIEW  Comparison: Prior radiograph from 10/07/2012  Findings: Endotracheal tube is in place with tip located 4.1 cm above the carina.  Enteric tube courses into the abdomen. Cardiomegaly is unchanged.  There has been interval worsening of diffuse pulmonary edema.  No pneumothorax.  No definite focal infiltrates are identified.  Osseous structures are unchanged.  IMPRESSION: 1.  Tip of endotracheal tube 4.1 cm above the carina. 2.  Interval worsening of pulmonary edema as compared to 10/07/2012.   Original Report Authenticated By: Rise Mu, M.D.    PHYSICAL EXAM General: sitting in chair on [place]: Unable to see JVP no thyromegaly or thyroid nodule.  Lungs:  Decreased breath sounds with long exp phae and mild exp wheeze CV:  Heart regular S1/S2, no S3/S4, no murmur.   No carotid bruit.  Normal pedal pulses.  Abdomen: obese Soft, nontender, no hepatosplenomegaly, no distention.  Extremities: No clubbing or cyanosis. Tr edema  TELEMETRY: Reviewed telemetry pt in NSR  ASSESSMENT AND PLAN: 42 yo with history of severe OSA, diastolic CHF EF 55-60% 10/08/12, CKD admitted with acute on chronic diastolic CHF and intubated for pulmonary edema.  1. Acute on chronic diastolic HF: Remains on IV lasix. Weight stable (down 19 pounds). Will transition to demadex 60 po bid. Can go to floor.   2. AKI on CKD: Renal function at baseline Cr = 2.0 based on labs from 9/19. Will recehck.   3. Elevated troponin: Peak at 3.21. Suspect demand ischemia vs HF. She does ahve apical wall motion abnormality.  She is on ASA and statin. Given renal failure would not cath at this point.   4. Anemia:   5. Pulmonary: CAP (beta hemolytic strep group B) remains afebrile, severe OSA and presumed asthma. Improving. She continues to refuse CPAP. Will need PFTs soon after d/c when more stable.   6. Hypernatremia/hypokalemia. Recheck labs. Supp K+ as needed.  7. HTN. Increase hydralazine 25 mg tid. No ace due to CKD.   8. Deconditioning. PT recommending CIR but today much improved with ambulation. Will consult case management for to HHPT.   9. DM2  Transfer to telemetry pending.    CLEGG,AMY NP-C 10/16/2012 7:48 AM   Patient seen and examined with Tonye Becket, NP. We discussed all aspects of the encounter. I agree with the assessment and plan as stated above. I have edited the notes with my findings She is much improved. Can transfer to floor. Change to po diuretics. Increase anti-HTN regimen. Will need to lose weight.   Daniel [name],MD 8:47 AM

## 2012-10-16 NOTE — Progress Notes (Signed)
Report called to 2E RN.

## 2012-10-16 NOTE — Progress Notes (Signed)
Report given to receiving RN. Patient is stable. No signs or symptoms of distress or discomfort. No verbal complaints. 

## 2012-10-16 NOTE — Progress Notes (Signed)
Inpatient Diabetes Program Recommendations  AACE/ADA: New Consensus Statement on Inpatient Glycemic Control (2013)  Target Ranges:  Prepandial:   less than 140 mg/dL      Peak postprandial:   less than 180 mg/dL (1-2 hours)      Critically ill patients:  140 - 180 mg/dL   Reason for Visit: Results for Jasmine [name], Jasmine [name] (MRN [id number]) as of 10/16/2012 14:36  Ref. Range 10/15/2012 19:56 10/15/2012 23:44 10/16/2012 04:19 10/16/2012 07:33 10/16/2012 11:42  Glucose-Capillary Latest Range: 70-99 mg/dL 409 (H) 811 (H) 914 (H) 140 (H) 187 (H)   Note patient transferred today.  Please consider increasing Lantus to 20 units daily and change correction to tid with meals and HS (instead of q 4 hours).  Also please add Novolog meal coverage 4 units tid with meals (hold if patient eats less than 50%).   Thanks, Beryl Meager, RN, BC-ADM Inpatient Diabetes Coordinator Pager [phone]

## 2012-10-16 NOTE — Progress Notes (Signed)
Speech Language Pathology Dysphagia Treatment Patient Details Name: Jasmine [name] MRN: [id number] DOB: 18-Jan-1971 Today's Date: 10/16/2012 Time: 4098-1191 SLP Time Calculation (min): 8 min  Assessment / Plan / Recommendation Clinical Impression  Pt. seen for dysphagia therapy and readiness for diet texture upgrade, however diet texture had been upgraded previously today (?)  Pt. denied experiencing difficulty swallowing over the weekend.  Pt. was observed with regular texture without difficulty masticating or transit to posterior oral cavity.  Thin liquids were tolerated via straw.  No cues were needed.  Recommend pt. to continue on regular texture and thin liquids with ST to sign off.    Diet Recommendation  Continue with Current Diet: Regular;Thin liquid    SLP Plan All goals met   Pertinent Vitals/Pain Back, pt. Repositioned herself   Swallowing Goals  SLP Swallowing Goals Patient will consume recommended diet without observed clinical signs of aspiration with: Set-up;Supervision/safety Swallow Study Goal #1 - Progress: Met  General Temperature Spikes Noted: No Respiratory Status: Room air Behavior/Cognition: Alert;Cooperative;Pleasant mood Oral Cavity - Dentition: Adequate natural dentition Patient Positioning: Upright in bed  Oral Cavity - Oral Hygiene Does patient have any of the following "at risk" factors?: None of the above Brush patient's teeth BID with toothbrush (using toothpaste with fluoride): Yes Patient is AT RISK - Oral Care Protocol followed (see row info): Yes   Dysphagia Treatment Treatment focused on: Skilled observation of diet tolerance Treatment Methods/Modalities: Skilled observation Patient observed directly with PO's: Yes Type of PO's observed: Regular;Thin liquids Feeding: Able to feed self Liquids provided via: Straw   GO     Royce Macadamia M.Ed [employer] [phone]  10/16/2012

## 2012-10-16 NOTE — Progress Notes (Signed)
Transferred to 4E03 via WC and monitor, RN to receive in room

## 2012-10-16 NOTE — Progress Notes (Signed)
PULMONARY  / CRITICAL CARE MEDICINE  Name: Jasmine [name] MRN: [id number] DOB: 19-Sep-1970    ADMISSION DATE:  10/07/2012 CONSULTATION DATE:  10/08/12  REFERRING MD :  Yaakov Guthrie, MD PRIMARY SERVICE: Cardiology  CHIEF COMPLAINT:  SOB  BRIEF PATIENT DESCRIPTION:  42 years female with diastolic heart failure, CKD, HTN, DM, OSA, admitted 9/13 with sudden onset of SOB and hypoxemia. Chest X ray consistent with acute pulmonary edema. Did not tolerate BiPAP and required endotracheal intubation.    SIGNIFICANT EVENTS:  STUDIES:  Echo 9/14 >> mild LVH, EF 55 to 60%, grade 2 diastolic dysfx  LINES / TUBES: ETT 9/13>>> 9/18 R Radial aline 9/13>>>OUT Lt IJ CVL 9/15 >>>9/22  CULTURES: Blood 9/14 >> Sputum 9/14 >> Moderate beta hemolytic Streptococcus  ANTIBIOTICS: Rocephin 9/14>>> 9/18 Azithro 9/14>>> 9/18 Ancef 9/18 >>>   SUBJECTIVE:  Feels better, tfr orders   VITAL SIGNS: Temp:  [98 F (36.7 C)-98.4 F (36.9 C)] 98.1 F (36.7 C) (09/22 0735) Pulse Rate:  [81-97] 81 (09/22 0735) Resp:  [13-30] 26 (09/22 0735) BP: (146-170)/(62-88) 146/79 mmHg (09/22 0735) SpO2:  [96 %-100 %] 98 % (09/22 0735) Weight:  [120.702 kg (266 lb 1.6 oz)] 120.702 kg (266 lb 1.6 oz) (09/22 0500) 4 liters     INTAKE / OUTPUT: Intake/Output     09/21 0701 - 09/22 0700 09/22 0701 - 09/23 0700   P.O. 600    I.V. (mL/kg) 300 (2.5)    IV Piggyback     Total Intake(mL/kg) 900 (7.5)    Urine (mL/kg/hr) 1350 (0.5)    Total Output 1350     Net -450            PHYSICAL EXAMINATION: General: no distress HEENT: no stridor Heart: Regular, no murmur Lungs: scattered rhonchi Abdomen: soft, non tender, + bowel sounds. Musculoskeletal: no edema Skin: No rashes  Neuro: Awake, follows commands, normal strength   LABS:  CBC Recent Labs     10/16/12  0830  WBC  14.6*  HGB  10.0*  HCT  30.0*  PLT  376   Iron Studies Lab Results  Component Value Date   IRON 15* 10/11/2012   TIBC 163*  10/11/2012   FERRITIN 699* 10/11/2012   Coag's No results found for this basename: APTT, INR,  in the last 72 hours  BMET Recent Labs     10/13/12  1530  NA  136  K  3.5  CL  94*  CO2  24  BUN  105*  CREATININE  2.08*  GLUCOSE  285*   Electrolytes Recent Labs     10/13/12  1530  CALCIUM  10.0   Sepsis Markers No results found for this basename: LACTICACIDVEN, PROCALCITON, O2SATVEN,  in the last 72 hours ABG No results found for this basename: PHART, PCO2ART, PO2ART,  in the last 72 hours Liver Enzymes No results found for this basename: AST, ALT, ALKPHOS, BILITOT, ALBUMIN,  in the last 72 hours  Cardiac Enzymes No results found for this basename: TROPONINI, PROBNP,  in the last 72 hours Glucose Recent Labs     10/15/12  1219  10/15/12  1540  10/15/12  1956  10/15/12  2344  10/16/12  0419  10/16/12  0733  GLUCAP  209*  224*  257*  216*  146*  140*    Imaging No results found.  ASSESSMENT / PLAN:  PULMONARY A: Acute respiratory failure 2nd to acute pulmonary edema and pneumonia. Hx of OSA - non compliant  with CPAP as outpt.  P:   -oxygen to keep SpO2 > 92% -continue qhs CPAP     CARDIOVASCULAR A:  Acute on chronic diastolic CHF. NSTEMI. Hypotension >> likely from diuresis, anemia, and meds >> improved. P:  -continue ASA, lipitor Per cardiology  RENAL A:   Acute on chronic renal failure >> hypotension, hypoxia, anemia, and diuresis. Non gap metabolic acidosis >> improved. Stage 4 CKD. P:   -monitor renal fx, urine outpt, electrolytes -lasix per renal, cardiology  GASTROINTESTINAL A:   Nutrition. DM gastroparesis. P:   On diet -protonix for SUP  HEMATOLOGIC A:   Anemia >> no obvious bleeding. P:  -f/u CBC -transfuse for Hb < 8 in setting of possible ACS -aranesp per renal  INFECTIOUS A:   CAP with beta hemolytic Streptococcus in sputum.>>resolved P:  Off ALL ABX  ENDOCRINE A:   DM type II with hyperglycemia. P:    -SSI  NEUROLOGIC A:   Pain control. Deconditioning. P:   -prn fentanyl -PT Eval  PCCM will see prn.   Dorcas Carrow Beeper  [phone]  Cell  [phone]  If no response or cell goes to voicemail, call beeper [phone]  10/16/2012, 9:06 AM

## 2012-10-16 NOTE — Progress Notes (Signed)
Report given to receiving RN. Patient is stable. No signs or symptoms of distress or discomfort. Manual BP is 190/110. BP reported off to receiving RN. No verbal complaints.

## 2012-10-16 NOTE — Progress Notes (Signed)
Chaplain followed up with patient while visiting on the unit. Patient was awake, alert, sitting up, and preparing to move to 4E. Patient expressed feeling stressed about health and relationships, particularly she feels like a "burden" for her boyfriend. Chaplain offered emotional and social support, exploring coping mechanisms for stress and encouraging the patient to do things to nurture and love herself. Patient moved to 4E.  Hillary D Irusta, Chaplain   10/16/12 1200  Clinical Encounter Type  Visited With Patient  Visit Type Follow-up  Spiritual Encounters  Spiritual Needs Emotional  Stress Factors  Patient Stress Factors Exhausted;Family relationships;Financial concerns;Health changes

## 2012-10-17 ENCOUNTER — Inpatient Hospital Stay (HOSPITAL_COMMUNITY): Payer: 59

## 2012-10-17 LAB — CBC
Hemoglobin: 10 g/dL — ABNORMAL LOW (ref 12.0–15.0)
MCH: 29.1 pg (ref 26.0–34.0)
MCHC: 32.9 g/dL (ref 30.0–36.0)
MCV: 88.4 fL (ref 78.0–100.0)
RBC: 3.44 MIL/uL — ABNORMAL LOW (ref 3.87–5.11)
WBC: 14.8 10*3/uL — ABNORMAL HIGH (ref 4.0–10.5)

## 2012-10-17 LAB — BASIC METABOLIC PANEL
CO2: 21 mEq/L (ref 19–32)
Chloride: 93 mEq/L — ABNORMAL LOW (ref 96–112)
GFR calc non Af Amer: 25 mL/min — ABNORMAL LOW (ref >90)
Glucose, Bld: 213 mg/dL — ABNORMAL HIGH (ref 70–99)
Potassium: 4 mEq/L (ref 3.5–5.1)
Sodium: 131 mEq/L — ABNORMAL LOW (ref 135–145)

## 2012-10-17 LAB — GLUCOSE, CAPILLARY: Glucose-Capillary: 309 mg/dL — ABNORMAL HIGH (ref 70–99)

## 2012-10-17 MED ORDER — POLYETHYLENE GLYCOL 3350 17 G PO PACK
17.0000 g | PACK | Freq: Every day | ORAL | Status: DC | PRN
  Filled 2012-10-17: qty 1

## 2012-10-17 MED ORDER — LEVOFLOXACIN IN D5W 750 MG/150ML IV SOLN
750.0000 mg | INTRAVENOUS | Status: DC
  Administered 2012-10-17 – 2012-10-18 (×2): 750 mg via INTRAVENOUS
  Filled 2012-10-17 (×3): qty 150

## 2012-10-17 MED ORDER — PHENOL 1.4 % MT LIQD
1.0000 | OROMUCOSAL | Status: DC | PRN
  Filled 2012-10-17: qty 177

## 2012-10-17 NOTE — Progress Notes (Signed)
Spoke with patient about CPAP. She said she would go on around 10. When I went to put on her CPAP she said she was still not ready. I instructed the patient to have her nurse call when she was ready.

## 2012-10-17 NOTE — Progress Notes (Signed)
Report given to receiving RN. Patient is stable. No signs or symptoms of distress or discomfort. No verbal complaints. 

## 2012-10-17 NOTE — Progress Notes (Signed)
Patient Jasmine [name] requested cough medicine. Dr. Adolm Joseph notified. New order for Tessalon. New order for CBG achs instead of q4. Will continue to monitor.  Dwan Hemmelgarn J, RN

## 2012-10-17 NOTE — Progress Notes (Signed)
Patient ID: Jasmine [name], female   DOB: 03/21/1970, 42 y.o.   MRN: [id number]  SUBJECTIVE:   Jasmine [name] is a 42 y.o [place] American female with history of nonobstructive CAD per cath 2010, HTN, uncontrolled DM and diastolic dysfunction. Admitted with respiratory failure due to PNA/CHF and a/c renal failure.  Extubated 9/18  Still with cough  Breathing easier. Off dopamine 10/13/12.  Renal 10/13/12 Speech evaluation completed with recommendations for  dysphagia 3.    Yesterday she was transitioned to po diuretics. Weight unchanged. Creatinine trending up 2.1>2.3   Complaining of cough and fatigue.    WBC 14.6>  14.8 UA ok  Sputum 9/14 >> Moderate beta hemolytic Streptococcus  ANTIBIOTICS:  Rocephin 9/14>>> 9/18  Azithro 9/14>>> 9/18  Ancef 9/18 >>> 9/20     . amLODipine  5 mg Oral Daily  . antiseptic oral rinse  15 mL Mouth Rinse BID  . aspirin  81 mg Oral Daily  . atorvastatin  40 mg Oral q1800  . [START ON 10/18/2012] darbepoetin (ARANESP) injection - NON-DIALYSIS  100 mcg Subcutaneous Q Wed-1800  . heparin subcutaneous  5,000 Units Subcutaneous Q8H  . hydrALAZINE  25 mg Oral Q6H  . insulin aspart  0-20 Units Subcutaneous TID WC  . insulin glargine  10 Units Subcutaneous Q24H  . pantoprazole  40 mg Oral QHS  . sodium chloride  10 mL Intracatheter Q12H  . torsemide  60 mg Oral BID      Filed Vitals:   10/16/12 2208 10/16/12 2330 10/17/12 0110 10/17/12 0548  BP:  140/63 146/53 134/67  Pulse:   87 91  Temp:   98 F (36.7 C) 98.1 F (36.7 C)  TempSrc:   Oral Oral  Resp:   20 18  Height:      Weight:    266 lb 12.8 oz (121.02 kg)  SpO2: 100%  100% 99%    Intake/Output Summary (Last 24 hours) at 10/17/12 0820 Last data filed at 10/17/12 0549  Gross per 24 hour  Intake    960 ml  Output   1300 ml  Net   -340 ml    LABS: Basic Metabolic Panel:  Recent Labs  13/08/65 0830 10/17/12 0600  NA 133* 131*  K 2.9* 4.0  CL 93* 93*  CO2 25 21  GLUCOSE 153* 213*   BUN 92* 96*  CREATININE 2.15* 2.33*  CALCIUM 9.6 9.0   Liver Function Tests: No results found for this basename: AST, ALT, ALKPHOS, BILITOT, PROT, ALBUMIN,  in the last 72 hours CBC:  Recent Labs  10/16/12 0830 10/17/12 0600  WBC 14.6* 14.8*  HGB 10.0* 10.0*  HCT 30.0* 30.4*  MCV 87.2 88.4  PLT 376 391    RADIOLOGY: Dg Chest 2 View  10/07/2012   CLINICAL DATA:  Chest pain. Shortness of breast.  EXAM: CHEST  2 VIEW  COMPARISON:  01/17/2012.  FINDINGS: Mild cardiopericardial enlargement. Perihilar vascular fullness with fissural thickening and probable Kerley lines in the lateral projection. There may be trace pleural effusions. No pneumothorax.  IMPRESSION: Cardiomegaly and pulmonary edema.   Electronically Signed   By: Tiburcio Pea   On: 10/07/2012 22:08   Dg Chest Port 1 View  10/09/2012   CLINICAL DATA:  Respiratory failure  EXAM: PORTABLE CHEST - 1 VIEW  COMPARISON:  10/08/2012  FINDINGS: Endotracheal tube has its tip 4.5 cm above the Carina. Nasogastric tube enters the stomach. Bilateral airspace density consistent with pulmonary edema persists, but is improved since  yesterday. No worsening or new findings. No effusions.  IMPRESSION: Improvement in pulmonary edema pattern.   Electronically Signed   By: Paulina Fusi M.D.   On: 10/09/2012 07:40   Dg Chest Port 1 View  10/08/2012   *RADIOLOGY REPORT*  Clinical Data: Intubation  PORTABLE CHEST - 1 VIEW  Comparison: Prior radiograph from 10/07/2012  Findings: Endotracheal tube is in place with tip located 4.1 cm above the carina.  Enteric tube courses into the abdomen. Cardiomegaly is unchanged.  There has been interval worsening of diffuse pulmonary edema.  No pneumothorax.  No definite focal infiltrates are identified.  Osseous structures are unchanged.  IMPRESSION: 1.  Tip of endotracheal tube 4.1 cm above the carina. 2.  Interval worsening of pulmonary edema as compared to 10/07/2012.   Original Report Authenticated By: Rise Mu, M.D.    PHYSICAL EXAM General: sitting in bed  Neck: Unable to see JVP no thyromegaly or thyroid nodule.  Lungs: Decreased breath sounds Coarse throughout CV:  Heart regular S1/S2, no S3/S4, no murmur.   No carotid bruit.  Normal pedal pulses.  Abdomen: obese Soft, nontender, no hepatosplenomegaly, no distention.  Extremities: No clubbing or cyanosis. Tr edema  TELEMETRY:  NSR  ASSESSMENT AND PLAN: 42 yo with history of severe OSA, diastolic CHF EF 55-60% 10/08/12, CKD admitted with acute on chronic diastolic CHF and intubated for pulmonary edema. Extubated 10/13/12 .   1. Acute on chronic diastolic HF: Remains on IV lasix. Weight unchanged (down 19 pounds). Creatinine trending up. 2.1>2.13 Hold diuretics today.   2. AKI on CKD: Renal function at baseline Cr = 2.0 based on labs from 9/19. Creatinine trending up 2.1>2.3   3. Elevated troponin: Peak at 3.21. Suspect demand ischemia vs HF. She does ahve apical wall motion abnormality.  She is on ASA and statin. Given renal failure would not cath at this point.   4. Anemia: hemoglobin stable   5. Pulmonary: CAP (beta hemolytic strep group B) remains afebrile, severe OSA and presumed asthma. WBC trending up. Get CXR. Start levaquin per pharmacy. She continues to refuse CPAP. Will need PFTs soon after d/c when more stable.   6. Hypernatremia/hypokalemia. Recheck labs. Supp K+ as needed.  7. HTN. Increase hydralazine 25 mg tid. No ace due to CKD.   8. Deconditioning. PT recommending HH. HH set up. AHC to follow    9. DM2  CLEGG,AMY NP-C 10/17/2012 8:20 AM  Patient seen and examined with Tonye Becket, NP. We discussed all aspects of the encounter. I agree with the assessment and plan as stated above.   Improving but still with cough and feeling run down. WBC continues to climb. Volume status looks better and with rising creatinine is likely dry. Will hold diuretics. Would treat with with 1 week course of quinolone. Continue PT.    Daniel [name],MD 10:11 PM

## 2012-10-17 NOTE — Progress Notes (Signed)
Patient has not had a bowel movement since 9/19.  MD notified. Miralax 17g one time dose ordered.

## 2012-10-18 LAB — CBC
Hemoglobin: 10.2 g/dL — ABNORMAL LOW (ref 12.0–15.0)
MCHC: 31.6 g/dL (ref 30.0–36.0)
MCV: 90 fL (ref 78.0–100.0)
Platelets: 416 10*3/uL — ABNORMAL HIGH (ref 150–400)
RBC: 3.59 MIL/uL — ABNORMAL LOW (ref 3.87–5.11)
RDW: 14.9 % (ref 11.5–15.5)

## 2012-10-18 LAB — GLUCOSE, CAPILLARY
Glucose-Capillary: 259 mg/dL — ABNORMAL HIGH (ref 70–99)
Glucose-Capillary: 267 mg/dL — ABNORMAL HIGH (ref 70–99)
Glucose-Capillary: 366 mg/dL — ABNORMAL HIGH (ref 70–99)

## 2012-10-18 LAB — BASIC METABOLIC PANEL
CO2: 22 mEq/L (ref 19–32)
Calcium: 9.6 mg/dL (ref 8.4–10.5)
Creatinine, Ser: 2.24 mg/dL — ABNORMAL HIGH (ref 0.50–1.10)
GFR calc Af Amer: 30 mL/min — ABNORMAL LOW (ref >90)
Glucose, Bld: 247 mg/dL — ABNORMAL HIGH (ref 70–99)
Potassium: 4.2 mEq/L (ref 3.5–5.1)

## 2012-10-18 MED ORDER — ALBUTEROL SULFATE (5 MG/ML) 0.5% IN NEBU
2.5000 mg | INHALATION_SOLUTION | RESPIRATORY_TRACT | Status: DC
  Administered 2012-10-18 (×2): 2.5 mg via RESPIRATORY_TRACT
  Filled 2012-10-18 (×2): qty 0.5

## 2012-10-18 MED ORDER — FLUCONAZOLE IN SODIUM CHLORIDE 400-0.9 MG/200ML-% IV SOLN
400.0000 mg | Freq: Once | INTRAVENOUS | Status: AC
  Administered 2012-10-18: 400 mg via INTRAVENOUS
  Filled 2012-10-18 (×2): qty 200

## 2012-10-18 MED ORDER — AMLODIPINE BESYLATE 10 MG PO TABS
10.0000 mg | ORAL_TABLET | Freq: Every day | ORAL | Status: DC
  Administered 2012-10-20 – 2012-10-22 (×3): 10 mg via ORAL
  Filled 2012-10-18 (×5): qty 1

## 2012-10-18 MED ORDER — AMLODIPINE BESYLATE 5 MG PO TABS
5.0000 mg | ORAL_TABLET | Freq: Once | ORAL | Status: AC
  Administered 2012-10-18: 5 mg via ORAL
  Filled 2012-10-18 (×2): qty 1

## 2012-10-18 MED ORDER — IPRATROPIUM BROMIDE 0.02 % IN SOLN
0.5000 mg | RESPIRATORY_TRACT | Status: DC
  Administered 2012-10-18 (×2): 0.5 mg via RESPIRATORY_TRACT
  Filled 2012-10-18 (×2): qty 2.5

## 2012-10-18 MED ORDER — FLUCONAZOLE IN SODIUM CHLORIDE 200-0.9 MG/100ML-% IV SOLN
200.0000 mg | INTRAVENOUS | Status: DC
  Administered 2012-10-19 – 2012-10-21 (×3): 200 mg via INTRAVENOUS
  Filled 2012-10-18 (×4): qty 100

## 2012-10-18 NOTE — Progress Notes (Signed)
Pt complaining of shortness of breath tonight. Pt lung sounds clear/diminished. Pt complaining of cough and requesting breathing treatment. MD on call notified by Day shift RN and placed new order for Q4 scheduled breathing treatments for pt. Will continue to monitor. Baron Hamper, RN

## 2012-10-18 NOTE — Progress Notes (Signed)
Patient's CBG continues to be elevated. Diabetes coordinator notified. Order placed in chart for a diabetes consult. Will continue to monitor patient for further changes in condition.

## 2012-10-18 NOTE — Progress Notes (Signed)
Inpatient Diabetes Program Recommendations  AACE/ADA: New Consensus Statement on Inpatient Glycemic Control (2013)  Target Ranges:  Prepandial:   less than 140 mg/dL      Peak postprandial:   less than 180 mg/dL (1-2 hours)      Critically ill patients:  140 - 180 mg/dL   Reason for Visit: Results for Jasmine [name], Jasmine [name] (MRN [id number]) as of 10/18/2012 14:39  Ref. Range 10/17/2012 10:56 10/17/2012 16:12 10/17/2012 21:06 10/18/2012 05:54 10/18/2012 11:08  Glucose-Capillary Latest Range: 70-99 mg/dL 409 (H) 811 (H) 914 (H) 259 (H) 267 (H)   Please increase Lantus to 20 units daily and add Novolog meal coverage 4 units tid with meals.  Note that patient was on 70/30 insulin prior to admit.  Beryl Meager, RN, BC-ADM Inpatient Diabetes Coordinator Pager [phone]

## 2012-10-18 NOTE — Progress Notes (Signed)
Physical Therapy Treatment Patient Details Name: Jasmine [name] MRN: [id number] DOB: 1970-08-26 Today's Date: 10/18/2012 Time: 1191-4782 PT Time Calculation (min): 20 min  PT Assessment / Plan / Recommendation  History of Present Illness     PT Comments   Pt progressing well.  Continues with DOE 3-4/4 on RA with SaO2 remaining 98% during ambulation.  Desires to return home with HHPT and no equipment needs.   Follow Up Recommendations  Home health PT     Does the patient have the potential to tolerate intense rehabilitation     Barriers to Discharge        Equipment Recommendations  None recommended by PT    Recommendations for Other Services    Frequency Min 3X/week   Progress towards PT Goals Progress towards PT goals: Progressing toward goals  Plan Current plan remains appropriate    Precautions / Restrictions Precautions Precautions: Fall Restrictions Weight Bearing Restrictions: No   Pertinent Vitals/Pain SaO2 98% on RA during ambulation     Mobility  Bed Mobility Bed Mobility: Supine to Sit Supine to Sit: 6: Modified independent (Device/Increase time);HOB flat;With rails Transfers Transfers: Sit to Stand;Stand to Sit Sit to Stand: 7: Independent;With upper extremity assist;From bed Stand to Sit: 7: Independent;With upper extremity assist Ambulation/Gait Ambulation/Gait Assistance: 5: Supervision Ambulation Distance (Feet): 200 Feet (times 2) Assistive device: None Ambulation/Gait Assistance Details: No knee buckling or tremors.  Did need seated rest break after 200 feet. Gait Pattern: Decreased step length - right;Decreased step length - left;Wide base of support Gait velocity: decreased Stairs: No         PT Goals (current goals can now be found in the care plan section)    Visit Information  Last PT Received On: 10/18/12 Assistance Needed: +1    Subjective Data  Subjective: "I don't think I need it."  re: rolling walker   Cognition   Cognition Arousal/Alertness: Awake/alert Behavior During Therapy: WFL for tasks assessed/performed Overall Cognitive Status: Within Functional Limits for tasks assessed    Balance  Balance Balance Assessed: Yes Static Sitting Balance Static Sitting - Balance Support: Feet supported Static Sitting - Level of Assistance: 7: Independent Static Standing Balance Static Standing - Balance Support: During functional activity Static Standing - Level of Assistance: 7: Independent  End of Session PT - End of Session Activity Tolerance: Patient tolerated treatment well Patient left: in bed;with call bell/phone within reach Nurse Communication: Mobility status   Newell Coral, [place] [phone] Acute Rehab Services     Newell Coral 10/18/2012, 9:30 AM

## 2012-10-18 NOTE — Progress Notes (Signed)
Patient ID: Jasmine [name], female   DOB: 11/12/1970, 42 y.o.   MRN: [id number]  SUBJECTIVE:  Jasmine [name] is a 42 y.o [place] American female with history of nonobstructive CAD per cath 2010, HTN, uncontrolled DM and diastolic dysfunction. Admitted with respiratory failure due to PNA/CHF and a/c renal failure.  Extubated 9/18  Still with cough  Breathing easier. Off dopamine 10/13/12.  Renal 10/13/12 Speech evaluation completed with recommendations for  dysphagia 3.   Yesterday diuretics held due creatinine bump. Jasmine [name] was also placed levaquin for elevated WBC. CXR- no infiltrate or pulmonary edema Mild bronchitic changes noted.   Overall feeling better but still will mild dyspnea.  Productive cough.   Creatinine trending up 2.1>2.3>pending  WBC 14.6>  14.8> pending UA ok  Sputum 9/14 >> Moderate beta hemolytic Streptococcus  ANTIBIOTICS:  Rocephin 9/14>>> 9/18  Azithro 9/14>>> 9/18  Ancef 9/18 >>> 9/20     . amLODipine  5 mg Oral Daily  . antiseptic oral rinse  15 mL Mouth Rinse BID  . aspirin  81 mg Oral Daily  . atorvastatin  40 mg Oral q1800  . darbepoetin (ARANESP) injection - NON-DIALYSIS  100 mcg Subcutaneous Q Wed-1800  . heparin subcutaneous  5,000 Units Subcutaneous Q8H  . hydrALAZINE  25 mg Oral Q6H  . insulin aspart  0-20 Units Subcutaneous TID WC  . insulin glargine  10 Units Subcutaneous Q24H  . levofloxacin (LEVAQUIN) IV  750 mg Intravenous Q24H  . pantoprazole  40 mg Oral QHS  . sodium chloride  10 mL Intracatheter Q12H      Filed Vitals:   10/17/12 2040 10/17/12 2355 10/18/12 0613 10/18/12 0728  BP: 148/62 160/75 144/59 153/80  Pulse: 100   89  Temp: 98.2 F (36.8 C)   97.5 F (36.4 C)  TempSrc: Oral   Oral  Resp: 18   20  Height:      Weight:    266 lb 6.4 oz (120.838 kg)  SpO2: 100%   100%    Intake/Output Summary (Last 24 hours) at 10/18/12 0851 Last data filed at 10/18/12 0729  Gross per 24 hour  Intake    483 ml  Output   1500 ml  Net  -1017  ml    LABS: Basic Metabolic Panel:  Recent Labs  40/98/11 0600 10/18/12 0930  NA 131* 134*  K 4.0 4.2  CL 93* 97  CO2 21 22  GLUCOSE 213* 247*  BUN 96* 91*  CREATININE 2.33* 2.24*  CALCIUM 9.0 9.6   Liver Function Tests: No results found for this basename: AST, ALT, ALKPHOS, BILITOT, PROT, ALBUMIN,  in the last 72 hours CBC:  Recent Labs  10/17/12 0600 10/18/12 1000  WBC 14.8* 16.2*  HGB 10.0* 10.2*  HCT 30.4* 32.3*  MCV 88.4 90.0  PLT 391 416*    RADIOLOGY: Dg Chest 2 View  10/07/2012   CLINICAL DATA:  Chest pain. Shortness of breast.  EXAM: CHEST  2 VIEW  COMPARISON:  01/17/2012.  FINDINGS: Mild cardiopericardial enlargement. Perihilar vascular fullness with fissural thickening and probable Kerley lines in the lateral projection. There may be trace pleural effusions. No pneumothorax.  IMPRESSION: Cardiomegaly and pulmonary edema.   Electronically Signed   By: Tiburcio Pea   On: 10/07/2012 22:08   Dg Chest Port 1 View  10/09/2012   CLINICAL DATA:  Respiratory failure  EXAM: PORTABLE CHEST - 1 VIEW  COMPARISON:  10/08/2012  FINDINGS: Endotracheal tube has its tip 4.5 cm above the  Carina. Nasogastric tube enters the stomach. Bilateral airspace density consistent with pulmonary edema persists, but is improved since yesterday. No worsening or new findings. No effusions.  IMPRESSION: Improvement in pulmonary edema pattern.   Electronically Signed   By: Paulina Fusi M.D.   On: 10/09/2012 07:40   Dg Chest Port 1 View  10/08/2012   *RADIOLOGY REPORT*  Clinical Data: Intubation  PORTABLE CHEST - 1 VIEW  Comparison: Prior radiograph from 10/07/2012  Findings: Endotracheal tube is in place with tip located 4.1 cm above the carina.  Enteric tube courses into the abdomen. Cardiomegaly is unchanged.  There has been interval worsening of diffuse pulmonary edema.  No pneumothorax.  No definite focal infiltrates are identified.  Osseous structures are unchanged.  IMPRESSION: 1.  Tip of  endotracheal tube 4.1 cm above the carina. 2.  Interval worsening of pulmonary edema as compared to 10/07/2012.   Original Report Authenticated By: Rise Mu, M.D.    PHYSICAL EXAM General: sitting in bed  Neck: Unable to see JVP no thyromegaly or thyroid nodule.  Lungs: Decreased breath sounds Coarse throughout CV:  Heart regular S1/S2, no S3/S4, no murmur.   No carotid bruit.  Normal pedal pulses.  Abdomen: obese Soft, nontender, no hepatosplenomegaly, no distention.  Extremities: No clubbing or cyanosis. Tr edema  TELEMETRY:  NSR  ASSESSMENT AND PLAN: 42 yo with history of severe OSA, diastolic CHF EF 55-60% 10/08/12, CKD admitted with acute on chronic diastolic CHF and intubated for pulmonary edema. Extubated 10/13/12 .   1. Acute on chronic diastolic HF: Remains on IV lasix. Weight unchanged (down 19 pounds). Creatinine trending up. 2.1>2.13> Bmet pending.   2. AKI on CKD: Renal function at baseline Cr = 2.0 based on labs from 9/19. Creatinine trending up 2.1>2.3 > bmet pending  3. Elevated troponin: Peak at 3.21. Suspect demand ischemia vs HF. Jasmine [name] does ahve apical wall motion abnormality.  Jasmine [name] is on ASA and statin. Given renal failure would not cath at this point.   4. Anemia: hemoglobin stable   5. Pulmonary: CAP (beta hemolytic strep group B) remains afebrile, severe OSA and presumed asthma. Continue levaquin.  Jasmine [name] continues to refuse CPAP. Will need PFTs soon after d/c when more stable and follow up with Dr Vassie Loll. F/U scheduled 11/24/12 at 10:00  6. Hypernatremia/hypokalemia. Recheck labs. Supp K+ as needed.  7. HTN. Increase hydralazine 25 mg tid. No ace due to CKD.   8. Deconditioning. PT recommending HH. HH set up. AHC to follow    9. DM2  Jasmine [name],AMY Jasmine [name]-C 10/18/2012 8:51 AM  Patient seen and examined with Jasmine Becket, Jasmine [name]. We discussed all aspects of the encounter. I agree with the assessment and plan as stated above.   Jasmine [name] is improving slowly but WBC still  climbing. No fevers. CXR ok. Quinolone added yesterday. Will add short course anti-fungal treatment with fluconazole.   Renal function improving. Weight stable. Restart diuretics tomorrow. Continue PT.  Hopefully home soon.   Jasmine [name],MD 11:15 AM

## 2012-10-18 NOTE — Progress Notes (Signed)
Report given to receiving RN. Patient is stable. No signs or symptoms of distress or discomfort. No verbal complaints. 

## 2012-10-19 ENCOUNTER — Encounter (HOSPITAL_COMMUNITY): Payer: Self-pay | Admitting: Student

## 2012-10-19 ENCOUNTER — Inpatient Hospital Stay (HOSPITAL_COMMUNITY): Payer: 59

## 2012-10-19 DIAGNOSIS — R109 Unspecified abdominal pain: Secondary | ICD-10-CM

## 2012-10-19 LAB — CBC
HCT: 33 % — ABNORMAL LOW (ref 36.0–46.0)
Hemoglobin: 10.2 g/dL — ABNORMAL LOW (ref 12.0–15.0)
MCHC: 30.9 g/dL (ref 30.0–36.0)
Platelets: 415 10*3/uL — ABNORMAL HIGH (ref 150–400)
RDW: 15.2 % (ref 11.5–15.5)
WBC: 15.8 10*3/uL — ABNORMAL HIGH (ref 4.0–10.5)

## 2012-10-19 LAB — GLUCOSE, CAPILLARY
Glucose-Capillary: 260 mg/dL — ABNORMAL HIGH (ref 70–99)
Glucose-Capillary: 210 mg/dL — ABNORMAL HIGH (ref 70–99)

## 2012-10-19 LAB — PREGNANCY, URINE: Preg Test, Ur: NEGATIVE

## 2012-10-19 LAB — BASIC METABOLIC PANEL
CO2: 21 mEq/L (ref 19–32)
Chloride: 97 mEq/L (ref 96–112)
Creatinine, Ser: 2.37 mg/dL — ABNORMAL HIGH (ref 0.50–1.10)
GFR calc Af Amer: 28 mL/min — ABNORMAL LOW (ref >90)
GFR calc non Af Amer: 24 mL/min — ABNORMAL LOW (ref >90)
Sodium: 134 mEq/L — ABNORMAL LOW (ref 135–145)

## 2012-10-19 MED ORDER — ALBUTEROL SULFATE (5 MG/ML) 0.5% IN NEBU
2.5000 mg | INHALATION_SOLUTION | Freq: Three times a day (TID) | RESPIRATORY_TRACT | Status: DC
  Administered 2012-10-19 – 2012-10-21 (×4): 2.5 mg via RESPIRATORY_TRACT
  Filled 2012-10-19 (×6): qty 0.5

## 2012-10-19 MED ORDER — LEVOFLOXACIN IN D5W 750 MG/150ML IV SOLN
750.0000 mg | INTRAVENOUS | Status: DC
  Administered 2012-10-20: 750 mg via INTRAVENOUS
  Filled 2012-10-19 (×2): qty 150

## 2012-10-19 MED ORDER — METRONIDAZOLE IN NACL 5-0.79 MG/ML-% IV SOLN
500.0000 mg | Freq: Three times a day (TID) | INTRAVENOUS | Status: DC
  Administered 2012-10-19 – 2012-10-22 (×9): 500 mg via INTRAVENOUS
  Filled 2012-10-19 (×11): qty 100

## 2012-10-19 MED ORDER — SODIUM CHLORIDE 0.9 % IV SOLN
INTRAVENOUS | Status: AC
  Administered 2012-10-19: 12:00:00 via INTRAVENOUS

## 2012-10-19 MED ORDER — INSULIN ASPART 100 UNIT/ML ~~LOC~~ SOLN
0.0000 [IU] | Freq: Four times a day (QID) | SUBCUTANEOUS | Status: DC
  Administered 2012-10-20: 7 [IU] via SUBCUTANEOUS
  Administered 2012-10-20: 11 [IU] via SUBCUTANEOUS
  Administered 2012-10-20 – 2012-10-21 (×2): 7 [IU] via SUBCUTANEOUS
  Administered 2012-10-21: 14:00:00 4 [IU] via SUBCUTANEOUS

## 2012-10-19 MED ORDER — IPRATROPIUM BROMIDE 0.02 % IN SOLN
0.5000 mg | Freq: Three times a day (TID) | RESPIRATORY_TRACT | Status: DC
  Administered 2012-10-19 – 2012-10-21 (×4): 0.5 mg via RESPIRATORY_TRACT
  Filled 2012-10-19 (×6): qty 2.5

## 2012-10-19 NOTE — Progress Notes (Signed)
Inpatient Diabetes Program Recommendations  AACE/ADA: New Consensus Statement on Inpatient Glycemic Control (2013)  Target Ranges:  Prepandial:   less than 140 mg/dL      Peak postprandial:   less than 180 mg/dL (1-2 hours)      Critically ill patients:  140 - 180 mg/dL   Reason for Visit: Note CBG's continue to be greater than goal.  RN placed referral regarding patients concerns about elevated CBG's.  Patient was on a total of 80 units of 70/30 insulin daily prior to admit.  Patient is NPO, however would likely benefit from more Lantus insulin for basal insulin needs.  Consider increasing Lantus to 24 units daily.  Patient received a total of 38 units of Novolog correction insulin on 10/19/12.   Beryl Meager, RN, BC-ADM Inpatient Diabetes Coordinator Pager [phone]

## 2012-10-19 NOTE — Progress Notes (Signed)
Pt feeling nauseous this AM, emesisx2, pt refused antiemetic. WIll continue to monitor. Baron Hamper, RN

## 2012-10-19 NOTE — Progress Notes (Signed)
Utilization Review Completed.   Hollan Philipp, RN, BSN Nurse Case Manager  [phone]  

## 2012-10-19 NOTE — Consult Note (Signed)
[facility] Consult Note                                                                                    Patient Demographics  Jasmine [name], is a 43 y.o. female   MRN: [id number]   DOB - 1970/08/11  Admit Date - 10/07/2012    Outpatient Primary MD for the patient is Dois Davenport., MD  Consult requested in the Hospital by Laurey Morale, MD, On 10/19/2012    Reason for consult abdominal pain and leukocytosis   With History of -  Past Medical History  Diagnosis Date  . Diastolic heart failure     echo 11/03/10 EF 55-60%  . Hypertension   . Chronic kidney disease (CKD)     stage II-III  . Hyperlipidemia   . Uncontrolled diabetes mellitus   . Diabetic ketoacidosis     with seizures  . Anemia   . Diabetic gastroparesis   . Obesity   . Coronary artery disease     mild per cath 2010  . CKD (chronic kidney disease), stage IV 01/20/2012      Past Surgical History  Procedure Laterality Date  . Cholecystectomy    . Carpal tunnel release  2003    in for   Chief Complaint  Patient presents with  . Chest Pain     HPI  Jasmine [name]  is a 42 y.o. female, with history of chronic diastolic heart failure EF 55 %, hypertension, chronic kidney disease stage 4 is line creatinine is around 2, dyslipidemia, diabetes mellitus type 2 in poor control, anemia of chronic disease, diabetic gastroparesis, nonobstructive CAD, the sleep apnea uses CPAP at night not compliant with it counseled to be compliant, was admitted to the hospital about 12 days ago with acute respiratory failure brought on by acute on chronic diastolic CHF along with chlamydia but pneumonia, she required admission to ICU and required endotracheal intubation for several days, she finished her antibiotic course her respiratory status improved, she was also diuresed in that time frame, and developed mild acute renal failure, she was seen by the cardiology, nephrology and pulmonary critical care, she was subsequently  extubated on 10/12/2012, and transferred to the floor under cardiology care.   Patient since being transferred to the floor has had mild leukocytosis of unclear etiology, placed on Levaquin and Diflucan by cardiology service  for leukocytosis without much benefit, last night she developed mild left lower quadrant abdominal pain which is intermittent and nonradiating worse with movement better with rest no relation to food it is associated with nausea vomiting multiple episodes, she denies any diarrhea or mucus in stool, she did have trace amounts of blood in her vomitus, she denies any previous GI complaints or problems, she is actually constipated last bowel movement was 3-4 days ago. Denies any diarrhea. Hospitalist service was consulted for evaluation of abdominal pain and persistent leukocytosis.    Review of Systems    In addition to the HPI above,   No Fever-chills, No Headache, No changes with Vision or hearing, No problems swallowing food or Liquids, No Chest pain, Cough or Shortness of Breath, Positive left lower quadrant pain,  positive nausea vomiting, trace amounts of blood in vomitus, constipated for the last 3-4 days No Blood in stool or Urine, No dysuria, No new skin rashes or bruises, No new joints pains-aches,  No new weakness, tingling, numbness in any extremity, No recent weight gain or loss, No polyuria, polydypsia or polyphagia, No significant Mental Stressors.  A full 10 point Review of Systems was done, except as stated above, all other Review of Systems were negative.   Social History History  Substance Use Topics  . Smoking status: Never Smoker   . Smokeless tobacco: Never Used  . Alcohol Use: Yes     Comment: occasionally      Family History Family History  Problem Relation Age of Onset  . Heart attack Mother   . Dementia Father       Prior to Admission medications   Medication Sig Start Date End Date Taking? Authorizing Provider  amLODipine  (NORVASC) 10 MG tablet Take 1 tablet (10 mg total) by mouth daily. 02/23/12  Yes Hadassah Pais, PA-C  aspirin 81 MG tablet Take 81 mg by mouth daily as needed. For pain   Yes Historical Provider, MD  ferrous sulfate 325 (65 FE) MG tablet Take 325 mg by mouth 2 (two) times daily.     Yes Historical Provider, MD  furosemide (LASIX) 80 MG tablet Take 2 tablets (160 mg total) by mouth 2 (two) times daily. 09/28/12  Yes Dolores Patty, MD  Hydrocodone-Acetaminophen 5-300 MG TABS Take 1 tablet by mouth 3 (three) times daily as needed. For pain   Yes Historical Provider, MD  Insulin Aspart Prot & Aspart (NOVOLOG MIX 70/30 FLEXPEN) (70-30) 100 UNIT/ML SUPN Inject 30-40 Units into the skin 2 (two) times daily with a meal. Take 40 units in the am & 30 units in the pm   Yes Historical Provider, MD  labetalol (NORMODYNE) 300 MG tablet Take 300 mg by mouth 2 (two) times daily.     Yes Historical Provider, MD  ramipril (ALTACE) 10 MG tablet Take 10 mg by mouth daily.    Yes Historical Provider, MD  FREESTYLE LITE test strip Twice daily before meals. 11/11/10   Historical Provider, MD  Lancets (FREESTYLE) lancets  11/11/10   Historical Provider, MD    Anti-infectives   Start     Dose/Rate Route Frequency Ordered Stop   10/20/12 1000  levofloxacin (LEVAQUIN) IVPB 750 mg     750 mg 100 mL/hr over 90 Minutes Intravenous Every 48 hours 10/19/12 0839 10/24/12 0959   10/19/12 1200  fluconazole (DIFLUCAN) IVPB 200 mg     200 mg 100 mL/hr over 60 Minutes Intravenous Every 24 hours 10/18/12 1128 10/26/12 1159   10/18/12 1200  fluconazole (DIFLUCAN) IVPB 400 mg     400 mg 100 mL/hr over 120 Minutes Intravenous  Once 10/18/12 1128 10/18/12 1526   10/17/12 1000  levofloxacin (LEVAQUIN) IVPB 750 mg  Status:  Discontinued     750 mg 100 mL/hr over 90 Minutes Intravenous Every 24 hours 10/17/12 0925 10/19/12 0839   10/12/12 0900  ceFAZolin (ANCEF) IVPB 2 g/50 mL premix     2 g 100 mL/hr over 30 Minutes Intravenous  3 times per day 10/12/12 0731 10/14/12 1639   10/08/12 1300  cefTRIAXone (ROCEPHIN) 2 g in dextrose 5 % 50 mL IVPB  Status:  Discontinued     2 g 100 mL/hr over 30 Minutes Intravenous Every 24 hours 10/08/12 1111 10/12/12 0728   10/08/12 1200  azithromycin (ZITHROMAX) 500 mg in dextrose 5 % 250 mL IVPB  Status:  Discontinued     500 mg 250 mL/hr over 60 Minutes Intravenous Every 24 hours 10/08/12 1111 10/12/12 0728      Scheduled Meds: . albuterol  2.5 mg Nebulization TID  . amLODipine  10 mg Oral Daily  . antiseptic oral rinse  15 mL Mouth Rinse BID  . aspirin  81 mg Oral Daily  . atorvastatin  40 mg Oral q1800  . darbepoetin (ARANESP) injection - NON-DIALYSIS  100 mcg Subcutaneous Q Wed-1800  . fluconazole (DIFLUCAN) IV  200 mg Intravenous Q24H  . heparin subcutaneous  5,000 Units Subcutaneous Q8H  . hydrALAZINE  25 mg Oral Q6H  . insulin aspart  0-20 Units Subcutaneous TID WC  . insulin glargine  10 Units Subcutaneous Q24H  . ipratropium  0.5 mg Nebulization TID  . [START ON 10/20/2012] levofloxacin (LEVAQUIN) IV  750 mg Intravenous Q48H  . pantoprazole  40 mg Oral QHS  . sodium chloride  10 mL Intracatheter Q12H   Continuous Infusions:  PRN Meds:.acetaminophen, albuterol, HYDROcodone-acetaminophen, ipratropium, morphine injection, ondansetron (ZOFRAN) IV, phenol, polyethylene glycol, sodium chloride  Allergies  Allergen Reactions  . Contrast Media [Iodinated Diagnostic Agents] Nausea And Vomiting  . Paroxetine Nausea And Vomiting  . Oxycodone Itching and Rash    Physical Exam  Vitals  Blood pressure 114/59, pulse 88, temperature 97.9 F (36.6 C), temperature source Oral, resp. rate 20, height 5\' 6"  (1.676 m), weight 120.793 kg (266 lb 4.8 oz), last menstrual period 09/27/2012, SpO2 97.00%.   1. General middle-aged obese Hispanic female lying in bed in NAD,    2. Normal affect and insight, Not Suicidal or Homicidal, Awake Alert, Oriented X 3.  3. No F.N deficits,  ALL C.Nerves Intact, Strength 5/5 all 4 extremities, Sensation intact all 4 extremities, Plantars down going.  4. Ears and Eyes appear Normal, Conjunctivae clear, PERRLA. Moist Oral Mucosa.  5. Supple Neck, No JVD, No cervical lymphadenopathy appriciated, No Carotid Bruits.  6. Symmetrical Chest wall movement, Good air movement bilaterally, CTAB.  7. RRR, No Gallops, Rubs or Murmurs, No Parasternal Heave.  8. Positive Bowel Sounds, Abdomen Soft but left lower quadrant is tender, No organomegaly appriciated,No rebound -guarding or rigidity.  9.  No Cyanosis, Normal Skin Turgor, No Skin Rash or Bruise.  10. Good muscle tone,  joints appear normal , no effusions, Normal ROM.  11. No Palpable Lymph Nodes in Neck or Axillae     Data Review  CBC  Recent Labs Lab 10/13/12 0415 10/16/12 0830 10/17/12 0600 10/18/12 1000 10/19/12 0650  WBC 11.3* 14.6* 14.8* 16.2* 15.8*  HGB 9.0* 10.0* 10.0* 10.2* 10.2*  HCT 27.7* 30.0* 30.4* 32.3* 33.0*  PLT 322 376 391 416* 415*  MCV 86.0 87.2 88.4 90.0 91.2  MCH 28.0 29.1 29.1 28.4 28.2  MCHC 32.5 33.3 32.9 31.6 30.9  RDW 13.7 13.8 14.0 14.9 15.2   ------------------------------------------------------------------------------------------------------------------  Chemistries   Recent Labs Lab 10/13/12 1530 10/16/12 0830 10/17/12 0600 10/18/12 0930 10/19/12 0650  NA 136 133* 131* 134* 134*  K 3.5 2.9* 4.0 4.2 4.3  CL 94* 93* 93* 97 97  CO2 24 25 21 22 21   GLUCOSE 285* 153* 213* 247* 278*  BUN 105* 92* 96* 91* 84*  CREATININE 2.08* 2.15* 2.33* 2.24* 2.37*  CALCIUM 10.0 9.6 9.0 9.6 9.3   ------------------------------------------------------------------------------------------------------------------ estimated creatinine clearance is 41 ml/min (by C-G formula based on Cr of 2.37). ------------------------------------------------------------------------------------------------------------------ No results  found for this basename:  TSH, T4TOTAL, FREET3, T3FREE, THYROIDAB,  in the last 72 hours   Coagulation profile No results found for this basename: INR, PROTIME,  in the last 168 hours ------------------------------------------------------------------------------------------------------------------- No results found for this basename: DDIMER,  in the last 72 hours -------------------------------------------------------------------------------------------------------------------  Cardiac Enzymes No results found for this basename: CK, CKMB, TROPONINI, MYOGLOBIN,  in the last 168 hours ------------------------------------------------------------------------------------------------------------------ No components found with this basename: POCBNP,    ---------------------------------------------------------------------------------------------------------------  Urinalysis    Component Value Date/Time   COLORURINE YELLOW 10/16/2012 1117   APPEARANCEUR CLEAR 10/16/2012 1117   LABSPEC 1.009 10/16/2012 1117   PHURINE 5.0 10/16/2012 1117   GLUCOSEU NEGATIVE 10/16/2012 1117   HGBUR NEGATIVE 10/16/2012 1117   BILIRUBINUR NEGATIVE 10/16/2012 1117   KETONESUR NEGATIVE 10/16/2012 1117   PROTEINUR 30* 10/16/2012 1117   UROBILINOGEN 0.2 10/16/2012 1117   NITRITE NEGATIVE 10/16/2012 1117   LEUKOCYTESUR NEGATIVE 10/16/2012 1117     Imaging results:   Dg Chest 2 View  10/17/2012   CLINICAL DATA:  Chest pain, cough, congestion  EXAM: CHEST  2 VIEW  COMPARISON:  10/13/2012  FINDINGS: Cardiomediastinal silhouette is stable. No pulmonary edema. Mild elevation of the right hemidiaphragm again noted. Central mild bronchitic changes. Bony thorax is stable. No segmental infiltrate.  IMPRESSION: No segmental infiltrate or pulmonary edema. Central mild bronchitic changes.   Electronically Signed   By: Natasha Mead   On: 10/17/2012 13:46      Echo       Assessment & Plan   1. Lower quadrant abdominal pain with nausea vomiting  constipation and leukocytosis - this could be diverticulitis versus stool impaction, we cannot rule out ovarian cyst or torsion of ovary, at this time we will obtain urine pregnancy test, if negative we will proceed with CT abdomen pelvis with oral contrast, for now she is on Levaquin I will add Flagyl which will help with diverticulitis in case that's radiology. We will make her n.p.o. with supportive care with Zofran and pain medications as needed. GI or surgery will be involved if required. If CT scan is negative bowel regimen will be initiated.   2. DM type II.- Control was poor at home with elevated A1c is below, for now we will continue present regimen and monitor closely as she is n.p.o. Will not titrate up at this time till by mouth status is confirmed. Have changed her to every 6 hour Accu-Cheks and sliding scale continue low-dose Lantus for now.  Lab Results  Component Value Date   HGBA1C 9.0* 01/18/2012    CBG (last 3)   Recent Labs  10/18/12 2118 10/19/12 0615 10/19/12 1054  GLUCAP 236* 260* 210*     3. Morbid obesity, chronic diastolic CHF with preserved EF, recent acute respiratory failure and CAP -  She clinically appears compensated from CHF and pneumonia standpoint, recent chest x-ray 2 days ago was stable, she does not have any productive cough. She does have some upper airway wheezing which could have been from vocal cord trauma/edema caused by ET tube versus some underlying reactive airway disease. She's not short of breath and not requiring oxygen, nebulizer treatments and oxygen as needed. Pulmonary and cardiology following the patient.   4. Acute renal failure on chronic kidney disease stage IV. Renal following the patient, she appears euvolemic at this time, since she is going to be n.p.o. for abdominal pain I will place her on gentle IV fluids, her baseline creatinine is 2. She is now  close to her baseline. Avoid nephrotoxins for now.   5. Hypertension stable on  present regimen.   6. Morbid obesity. Obstructive sleep apnea. Counseled on diet exercise, outpatient followup with PCP, nighttime CPAP to continue, consult on compliance.      DVT Prophylaxis Heparin    AM Labs Ordered, also please review Full Orders  Family Communication: Plan discussed with patient     Thank you for the consult, we will follow the patient with you in the Hospital.   Susa Raring K M.D on 10/19/2012 at 11:31 AM  Between 7am to 7pm - Pager - [phone]  After 7pm go to [email or web address] - password [email or web address]  And look for the night coverage person covering me after hours   Thank you for the consult, we will follow the patient with you in the [facility].   [facility] Office  [phone]

## 2012-10-19 NOTE — Progress Notes (Signed)
NUTRITION FOLLOW UP  Intervention:   No further nutrition interventions; RD sign-off. Please consult RD if nutrition needs arise.  Nutrition Dx:   Inadequate oral intake related to inability to eat as evidenced by NPO diet; discontinued- diet advanced and pt eating well  Goal:   Pt to meet >/= 90% of their estimated nutrition needs; being met  Monitor:   Diet advance; Carb Modified PO intake; 75-100% of most meals Weight; 15 lb wt loss since admission Labs; high glucose, high BUN, high creatinine, low GFR, low hemoglobin  Assessment:   Pt reports that her appetite has been good and she is eating well. Pt NPO for lunch due to planned procedure and states she is very hungry. Pt reports eating well PTA and following a carb modified and low sodium diet. Pt states she has received diet education in the past and has no further questions or concerns. Encouraged decreased intake of sugar/sweets and increased fruit and vegetable intake. Reviewed high sodium foods avoid. No further nutrition interventions needed; RD signing-off.  Height: Ht Readings from Last 1 Encounters:  10/16/12 5\' 6"  (1.676 m)    Weight Status:   Wt Readings from Last 1 Encounters:  10/19/12 266 lb 4.8 oz (120.793 kg)    Re-estimated needs:  Kcal: 2100-2300  Protein: 80-90 gm  Fluid: per MD  Skin: intact; + 1 generalized edema, non-pitting RLE and LLE edema  Diet Order: NPO   Intake/Output Summary (Last 24 hours) at 10/19/12 1416 Last data filed at 10/19/12 1300  Gross per 24 hour  Intake    520 ml  Output    375 ml  Net    145 ml    Last BM: 9/24   Labs:   Recent Labs Lab 10/13/12 0415  10/17/12 0600 10/18/12 0930 10/19/12 0650  NA 147*  < > 131* 134* 134*  K 3.4*  < > 4.0 4.2 4.3  CL 104  < > 93* 97 97  CO2 29  < > 21 22 21   BUN 109*  < > 96* 91* 84*  CREATININE 2.28*  < > 2.33* 2.24* 2.37*  CALCIUM 9.8  < > 9.0 9.6 9.3  PHOS 5.3*  --   --   --   --   GLUCOSE 163*  < > 213* 247* 278*   < > = values in this interval not displayed.  CBG (last 3)   Recent Labs  10/18/12 2118 10/19/12 0615 10/19/12 1054  GLUCAP 236* 260* 210*    Scheduled Meds: . albuterol  2.5 mg Nebulization TID  . amLODipine  10 mg Oral Daily  . antiseptic oral rinse  15 mL Mouth Rinse BID  . aspirin  81 mg Oral Daily  . atorvastatin  40 mg Oral q1800  . darbepoetin (ARANESP) injection - NON-DIALYSIS  100 mcg Subcutaneous Q Wed-1800  . fluconazole (DIFLUCAN) IV  200 mg Intravenous Q24H  . heparin subcutaneous  5,000 Units Subcutaneous Q8H  . hydrALAZINE  25 mg Oral Q6H  . insulin aspart  0-20 Units Subcutaneous Q6H  . insulin glargine  10 Units Subcutaneous Q24H  . ipratropium  0.5 mg Nebulization TID  . [START ON 10/20/2012] levofloxacin (LEVAQUIN) IV  750 mg Intravenous Q48H  . metronidazole  500 mg Intravenous Q8H  . pantoprazole  40 mg Oral QHS  . sodium chloride  10 mL Intracatheter Q12H    Continuous Infusions: . sodium chloride 50 mL/hr at 10/19/12 1147    Ian Malkin RD, LDN Inpatient  Clinical Dietitian Pager: [phone] After Hours Pager: [phone]

## 2012-10-19 NOTE — Progress Notes (Signed)
Pt told RT that she had not been wearing CPAP last few nights and probably will not wear tonight.  Pt states she will notify RT if and when she wants to wear CPAP tonight.

## 2012-10-19 NOTE — Progress Notes (Signed)
Patient ID: Jasmine [name], female   DOB: [birth year], 42 y.o.   MRN: [id number]  SUBJECTIVE:  Jasmine [name] is a 42 y.o [place] American female with history of nonobstructive CAD per cath 2010, HTN, uncontrolled DM and diastolic dysfunction. Admitted with respiratory failure due to PNA/CHF and a/c renal failure.  Extubated 9/18  Still with cough  Breathing easier. Off dopamine 10/13/12.  Renal 10/13/12 Speech evaluation completed with recommendations for  dysphagia 3.   Diuretics have been held. She was also placed levaquin for elevated WBC. Yesterday diflucan added.   Complaining of fatigue and dyspnea with productive cough. + Ab pain. Vomiting this am.  Small amount of blood noted in vomitus. No diarrhea. No f/c.     Creatinine trending up 2.1>2.3>2.37  WBC 14.6>  14.8>16.2>15.8 UA ok  Sputum 9/14 >> Moderate beta hemolytic Streptococcus  ANTIBIOTICS:  Rocephin 9/14>>> 9/18  Azithro 9/14>>> 9/18  Ancef 9/18 >>> 9/20 Levaquin 9/23 Diflucan 9/24     . albuterol  2.5 mg Nebulization TID  . amLODipine  10 mg Oral Daily  . antiseptic oral rinse  15 mL Mouth Rinse BID  . aspirin  81 mg Oral Daily  . atorvastatin  40 mg Oral q1800  . darbepoetin (ARANESP) injection - NON-DIALYSIS  100 mcg Subcutaneous Q Wed-1800  . fluconazole (DIFLUCAN) IV  200 mg Intravenous Q24H  . heparin subcutaneous  5,000 Units Subcutaneous Q8H  . hydrALAZINE  25 mg Oral Q6H  . insulin aspart  0-20 Units Subcutaneous TID WC  . insulin glargine  10 Units Subcutaneous Q24H  . ipratropium  0.5 mg Nebulization TID  . [START ON 10/20/2012] levofloxacin (LEVAQUIN) IV  750 mg Intravenous Q48H  . pantoprazole  40 mg Oral QHS  . sodium chloride  10 mL Intracatheter Q12H      Filed Vitals:   10/18/12 2341 10/19/12 0139 10/19/12 0236 10/19/12 0516  BP:  142/66  114/59  Pulse:  94 93 88  Temp:  98.5 F (36.9 C)  97.9 F (36.6 C)  TempSrc:  Oral  Oral  Resp:  20  20  Height:      Weight:    120.793 kg (266 lb  4.8 oz)  SpO2: 98% 97% 100% 97%    Intake/Output Summary (Last 24 hours) at 10/19/12 1101 Last data filed at 10/19/12 0900  Gross per 24 hour  Intake    760 ml  Output    775 ml  Net    -15 ml    LABS: Basic Metabolic Panel:  Recent Labs  41/32/44 0930 10/19/12 0650  NA 134* 134*  K 4.2 4.3  CL 97 97  CO2 22 21  GLUCOSE 247* 278*  BUN 91* 84*  CREATININE 2.24* 2.37*  CALCIUM 9.6 9.3   Liver Function Tests: No results found for this basename: AST, ALT, ALKPHOS, BILITOT, PROT, ALBUMIN,  in the last 72 hours CBC:  Recent Labs  10/18/12 1000 10/19/12 0650  WBC 16.2* 15.8*  HGB 10.2* 10.2*  HCT 32.3* 33.0*  MCV 90.0 91.2  PLT 416* 415*    RADIOLOGY: Dg Chest 2 View  10/07/2012   CLINICAL DATA:  Chest pain. Shortness of breast.  EXAM: CHEST  2 VIEW  COMPARISON:  01/17/2012.  FINDINGS: Mild cardiopericardial enlargement. Perihilar vascular fullness with fissural thickening and probable Kerley lines in the lateral projection. There may be trace pleural effusions. No pneumothorax.  IMPRESSION: Cardiomegaly and pulmonary edema.   Electronically Signed   By: Tiburcio Pea  On: 10/07/2012 22:08   Dg Chest Port 1 View  10/09/2012   CLINICAL DATA:  Respiratory failure  EXAM: PORTABLE CHEST - 1 VIEW  COMPARISON:  10/08/2012  FINDINGS: Endotracheal tube has its tip 4.5 cm above the Carina. Nasogastric tube enters the stomach. Bilateral airspace density consistent with pulmonary edema persists, but is improved since yesterday. No worsening or new findings. No effusions.  IMPRESSION: Improvement in pulmonary edema pattern.   Electronically Signed   By: Paulina Fusi M.D.   On: 10/09/2012 07:40   Dg Chest Port 1 View  10/08/2012   *RADIOLOGY REPORT*  Clinical Data: Intubation  PORTABLE CHEST - 1 VIEW  Comparison: Prior radiograph from 10/07/2012  Findings: Endotracheal tube is in place with tip located 4.1 cm above the carina.  Enteric tube courses into the abdomen. Cardiomegaly is  unchanged.  There has been interval worsening of diffuse pulmonary edema.  No pneumothorax.  No definite focal infiltrates are identified.  Osseous structures are unchanged.  IMPRESSION: 1.  Tip of endotracheal tube 4.1 cm above the carina. 2.  Interval worsening of pulmonary edema as compared to 10/07/2012.   Original Report Authenticated By: Rise Mu, M.D.    PHYSICAL EXAM General: lying in bed . uncomfortable Neck: Unable to see JVP no thyromegaly or thyroid nodule.  Lungs: Decreased breath sounds Coarse throughout CV:  Heart regular S1/S2, no S3/S4, no murmur.   No carotid bruit.  Normal pedal pulses.  Abdomen: obese Soft, nontender, no hepatosplenomegaly, no distention. LLQ tender. No rebound Extremities: No clubbing or cyanosis. No edema  TELEMETRY:  NSR  ASSESSMENT AND PLAN: 42 yo with history of severe OSA, diastolic CHF EF 55-60% 10/08/12, CKD admitted with acute on chronic diastolic CHF and intubated for pulmonary edema. Extubated 10/13/12 .   1. Acute on chronic diastolic HF: Diuretics on hold.  Weight unchanged (down 19 pounds). Creatinine trending up. 2.1>2.13>2.37.   2. AKI on CKD: Renal function at baseline Cr = 2.0 based on labs from 9/19. Creatinine trending up 2.1>2.3 >2.37  3. Elevated troponin: Peak at 3.21. Suspect demand ischemia vs HF. She does ahve apical wall motion abnormality.  She is on ASA and statin. Given renal failure would not cath at this point.   4. Anemia: hemoglobin stable   5. Pulmonary: CAP (beta hemolytic strep group B) remains afebrile, severe OSA and presumed asthma. Continue levaquin.  She continues to refuse CPAP. Will need PFTs soon after d/c when more stable and follow up with Dr Vassie Loll. F/U scheduled 11/24/12 at 10:00  6. Hypernatremia/hypokalemia. Recheck labs. Supp K+ as needed.  7. HTN. Increase hydralazine 25 mg tid. No ace due to CKD.   8. Deconditioning. PT recommending HH. HH set up. [facility] to follow    9. DM2  10. Abdominal  Pain- CT of abdomen with contrast  AMY CLEGG, NP-C 10/19/2012 11:01 AM  Patient seen and examined with Tonye Becket, NP. We discussed all aspects of the encounter. I agree with the assessment and plan as stated above.   She is worse this am. With significant LLQ pain and tenderness. + nausea. WBC remains elevated. Will get Ab/pelvis CT with oral contrast to assess for abscess or diverticulitis. Gastroparesis may also be contributing. Keep npo for now.  HF well controlled. Cr worse so may need gentle IVF while NPO.   Have consulted [facility] to help [place] and possibly assume her care.  Randolf Sansoucie,MD 11:11 AM

## 2012-10-19 NOTE — Progress Notes (Deleted)
Entered in error

## 2012-10-19 NOTE — Progress Notes (Signed)
Pt is alert and oriented x4. Pt has had no complaints of pain. Pt has been throwing up throughout the shift with complaints of nausea. RN offered the patient medication for the nausea and the patient refused saying "it only makes it worse" MD is aware of the situation. Pt is prepping for a CT of abdomen. Pt is having audible wheezing. MD is aware. Will continue to monitor

## 2012-10-20 DIAGNOSIS — E109 Type 1 diabetes mellitus without complications: Secondary | ICD-10-CM

## 2012-10-20 LAB — GLUCOSE, CAPILLARY
Glucose-Capillary: 195 mg/dL — ABNORMAL HIGH (ref 70–99)
Glucose-Capillary: 251 mg/dL — ABNORMAL HIGH (ref 70–99)
Glucose-Capillary: 271 mg/dL — ABNORMAL HIGH (ref 70–99)
Glucose-Capillary: 223 mg/dL — ABNORMAL HIGH (ref 70–99)

## 2012-10-20 LAB — BASIC METABOLIC PANEL
CO2: 21 mEq/L (ref 19–32)
Creatinine, Ser: 2.28 mg/dL — ABNORMAL HIGH (ref 0.50–1.10)
Glucose, Bld: 235 mg/dL — ABNORMAL HIGH (ref 70–99)
Potassium: 4.5 mEq/L (ref 3.5–5.1)
Sodium: 137 mEq/L (ref 135–145)

## 2012-10-20 LAB — CBC
Hemoglobin: 10.6 g/dL — ABNORMAL LOW (ref 12.0–15.0)
MCH: 28.5 pg (ref 26.0–34.0)
MCV: 91.7 fL (ref 78.0–100.0)
Platelets: 410 10*3/uL — ABNORMAL HIGH (ref 150–400)
RBC: 3.72 MIL/uL — ABNORMAL LOW (ref 3.87–5.11)

## 2012-10-20 MED ORDER — BENZONATATE 100 MG PO CAPS
100.0000 mg | ORAL_CAPSULE | Freq: Two times a day (BID) | ORAL | Status: DC | PRN
  Administered 2012-10-20: 16:00:00 100 mg via ORAL
  Filled 2012-10-20: qty 1

## 2012-10-20 MED ORDER — INSULIN GLARGINE 100 UNIT/ML ~~LOC~~ SOLN
20.0000 [IU] | SUBCUTANEOUS | Status: DC
  Administered 2012-10-20 – 2012-10-21 (×2): 20 [IU] via SUBCUTANEOUS
  Filled 2012-10-20 (×3): qty 0.2

## 2012-10-20 NOTE — Progress Notes (Signed)
Pt requesting s/thing for cough.  Dr. Catha Gosselin notified. Tessalon pearls order placed.  Will continue to monitor. Jasmine [name], Charity fundraiser.

## 2012-10-20 NOTE — Progress Notes (Signed)
[facility]                                                                                Patient Demographics  Jasmine [name], is a 42 y.o. female, DOB - [birth year], ZOX:[id number]  Admit date - 10/07/2012   Admitting Physician Jasmine Guthrie, MD  Outpatient Primary MD for the patient is Jasmine [name]., MD  LOS - 13   Chief Complaint  Patient presents with  . Chest Pain        Assessment & Plan  1.   Abdominal pain with Nausea and vomiting    -Will continue patient on Levaquin and Flagyl. Her leukocytosis has improved. We'll continue Zofran as well as pain medications. GI may be consulted for further management if patient's nausea and vomiting did not subside. CT scan of the abdomen did show an ovarian cyst with possible uterine fibroid.   may consider CT of the abdomen and pelvis with oral contrast.    2.  DIABETES MELLITUS, TYPE I with retinopathy  - Will continue Accu-Cheks along with insulin sliding scale and low dose Lantus.  3.  Hypertension  -Will continue hydralazine and amlodipine.  4.  OSA (obstructive sleep apnea) - patient does use CPAP at night. Spoke with her at length regarding the compliance with using the CPAP.  5. Acute respiratory failure with hypoxia secondary to community-acquired pneumonia, currently on Levaquin.  6. Acute on chronic diastolic heart failure  Being followed by cardiology. Diuretics are currently on hold due to renal function.   7. Acute on chronic renal failure  Nephrology has been following the patient. At this time she does appear to be euvolemic although she is n.p.o. due to her current nausea and vomiting. Will continue gentle rehydration with IV fluids.   Code Status: Full  Family Communication: None  Disposition Plan: We are consulted.  Currently admitted.    Procedures Echo   Consults Hospitalist, Nephrology, Pulmonology,  Cardiology primary   DVT Prophylaxis  Able to ambulate   Lab Results  Component Value  Date   PLT 410* 10/20/2012    Medications  Scheduled Meds: . albuterol  2.5 mg Nebulization TID  . amLODipine  10 mg Oral Daily  . antiseptic oral rinse  15 mL Mouth Rinse BID  . aspirin  81 mg Oral Daily  . atorvastatin  40 mg Oral q1800  . darbepoetin (ARANESP) injection - NON-DIALYSIS  100 mcg Subcutaneous Q Wed-1800  . fluconazole (DIFLUCAN) IV  200 mg Intravenous Q24H  . heparin subcutaneous  5,000 Units Subcutaneous Q8H  . hydrALAZINE  25 mg Oral Q6H  . insulin aspart  0-20 Units Subcutaneous Q6H  . insulin glargine  10 Units Subcutaneous Q24H  . ipratropium  0.5 mg Nebulization TID  . levofloxacin (LEVAQUIN) IV  750 mg Intravenous Q48H  . metronidazole  500 mg Intravenous Q8H  . pantoprazole  40 mg Oral QHS  . sodium chloride  10 mL Intracatheter Q12H   Continuous Infusions: . sodium chloride 50 mL/hr at 10/19/12 1147   PRN Meds:.acetaminophen, albuterol, HYDROcodone-acetaminophen, ipratropium, morphine injection, ondansetron (ZOFRAN) IV, phenol, polyethylene glycol, sodium chloride  Antibiotics    Anti-infectives   Start  Dose/Rate Route Frequency Ordered Stop   10/20/12 1000  levofloxacin (LEVAQUIN) IVPB 750 mg     750 mg 100 mL/hr over 90 Minutes Intravenous Every 48 hours 10/19/12 0839 10/24/12 0959   10/19/12 1200  fluconazole (DIFLUCAN) IVPB 200 mg     200 mg 100 mL/hr over 60 Minutes Intravenous Every 24 hours 10/18/12 1128 10/26/12 1159   10/19/12 1200  metroNIDAZOLE (FLAGYL) IVPB 500 mg     500 mg 100 mL/hr over 60 Minutes Intravenous Every 8 hours 10/19/12 1141     10/18/12 1200  fluconazole (DIFLUCAN) IVPB 400 mg     400 mg 100 mL/hr over 120 Minutes Intravenous  Once 10/18/12 1128 10/18/12 1526   10/17/12 1000  levofloxacin (LEVAQUIN) IVPB 750 mg  Status:  Discontinued     750 mg 100 mL/hr over 90 Minutes Intravenous Every 24 hours 10/17/12 0925 10/19/12 0839   10/12/12 0900  ceFAZolin (ANCEF) IVPB 2 g/50 mL premix     2 g 100 mL/hr over 30  Minutes Intravenous 3 times per day 10/12/12 0731 10/14/12 1639   10/08/12 1300  cefTRIAXone (ROCEPHIN) 2 g in dextrose 5 % 50 mL IVPB  Status:  Discontinued     2 g 100 mL/hr over 30 Minutes Intravenous Every 24 hours 10/08/12 1111 10/12/12 0728   10/08/12 1200  azithromycin (ZITHROMAX) 500 mg in dextrose 5 % 250 mL IVPB  Status:  Discontinued     500 mg 250 mL/hr over 60 Minutes Intravenous Every 24 hours 10/08/12 1111 10/12/12 0728       Time Spent in minutes   25 minutes   Jasmine [name] D.O. on 10/20/2012 at 10:38 AM  Between 7am to 7pm - Pager - [phone]  After 7pm go to [email or web address] - password [email or web address]  And look for the night coverage person covering for me after hours  [facility] Office  [phone]    Subjective:   Jasmine [name] was seen and examined today. Patient continues to complain of nausea and vomiting. However there patient was dry heaving. Patient states her her vomiting has not gone away.  Patient continues to have left lower quadrant abdominal pain and still remains constipated.  Currently denies any chest pain, palpitations, shortness of breath, dizziness or cough, or diarrhea.  Objective:   Filed Vitals:   10/19/12 2001 10/19/12 2038 10/20/12 0409 10/20/12 0509  BP:  149/64 129/56 138/61  Pulse:  104 84   Temp:  98.5 F (36.9 C) 98.6 F (37 C)   TempSrc:  Oral Oral   Resp:  20 18   Height:      Weight:   120.249 kg (265 lb 1.6 oz)   SpO2: 99% 99% 98%     Wt Readings from Last 3 Encounters:  10/20/12 120.249 kg (265 lb 1.6 oz)  07/24/12 123.832 kg (273 lb)  03/30/12 126.145 kg (278 lb 1.6 oz)     Intake/Output Summary (Last 24 hours) at 10/20/12 1038 Last data filed at 10/19/12 2300  Gross per 24 hour  Intake      0 ml  Output   1050 ml  Net  -1050 ml    Exam General: Awake Alert, Oriented X 3, Normal affect, Well developed HEENT: [place]/AT,PERRAL  Neck: Supple Neck,No JVD, No cervical lymphadenopathy appriciated.   Respiratory: Symmetrical Chest wall movement, Good air movement bilaterally, CTAB  Upper airway wheezing noted Cardiovascular: RRR, +S1/S2, No Gallops,Rubs or new Murmurs  Abdomen: Soft, obese, tender in LLQ, nondistended, positive bowel  sounds, no rebound Extremities: No edema. No Cyanosis, Clubbing.  Neuro: Grossly intact, nonfocal.    Data Review   Micro Results No results found for this or any previous visit (from the past 240 hour(s)).  Radiology Reports Ct Abdomen Pelvis Wo Contrast  10/19/2012   CLINICAL DATA:  Left lower quadrant abdominal pain, nausea and vomiting. Elevated white blood cell count.  EXAM: CT ABDOMEN AND PELVIS WITHOUT CONTRAST  TECHNIQUE: Multidetector CT imaging of the abdomen and pelvis was performed following the standard protocol without intravenous contrast.  COMPARISON:  08/05/2009  FINDINGS: Unenhanced CT was performed per clinician order. Lack of IV contrast limits sensitivity and specificity, especially for evaluation of abdominal/pelvic solid viscera.  The lung bases are clear. Heart size is normal. No pericardial or pleural effusion.  Splenic granulomas are reidentified. Cholecystectomy clips are noted. Liver, adrenal glands, and pancreas are unremarkable. Punctate left renal probable vascular calcifications are noted. No hydronephrosis or evidence for significant radiopaque renal or ureteral calculi.  Lobulated uterine contour reidentified with IUD in place. The ovaries are relatively midline with respect to each other and inhomogeneously enlarged on the right with possible underlying approximately 5 cm lesion measuring 20 Hounsfield units. This most likely represents a hemorrhagic cyst. No pelvic free fluid. The appendix and bowel are unremarkable. Moderate atheromatous aortic calcification is noted. The bladder is normal.  Representative 7 mm retroperitoneal periaortic lymph node image 47 is stable.  No acute osseous abnormality.  IMPRESSION: Lobulated uterine  contour compatible with previously seen uterine fibroid.  The ovaries are relatively medial in location with probable right hemorrhagic ovarian cyst, although endometrioma can have a similar appearance and endometriosis can account for this apparent ovarian position ( "Kissing ovaries" ).  No acute intra-abdominal or pelvic pathology.   Electronically Signed   By: Christiana Pellant M.D.   On: 10/19/2012 21:07   Dg Chest 2 View  10/17/2012   CLINICAL DATA:  Chest pain, cough, congestion  EXAM: CHEST  2 VIEW  COMPARISON:  10/13/2012  FINDINGS: Cardiomediastinal silhouette is stable. No pulmonary edema. Mild elevation of the right hemidiaphragm again noted. Central mild bronchitic changes. Bony thorax is stable. No segmental infiltrate.  IMPRESSION: No segmental infiltrate or pulmonary edema. Central mild bronchitic changes.   Electronically Signed   By: Natasha Mead   On: 10/17/2012 13:46   Dg Chest 2 View  10/07/2012   CLINICAL DATA:  Chest pain. Shortness of breast.  EXAM: CHEST  2 VIEW  COMPARISON:  01/17/2012.  FINDINGS: Mild cardiopericardial enlargement. Perihilar vascular fullness with fissural thickening and probable Kerley lines in the lateral projection. There may be trace pleural effusions. No pneumothorax.  IMPRESSION: Cardiomegaly and pulmonary edema.   Electronically Signed   By: Tiburcio Pea   On: 10/07/2012 22:08   Dg Chest Port 1 View  10/13/2012   *RADIOLOGY REPORT*  Clinical Data: Evaluate congestive heart failure.  PORTABLE CHEST - 1 VIEW  Comparison: Chest radiograph 10/12/2012  Findings: Left-sided central venous catheter tip projects over the superior vena cava, unchanged.  Interval extubation.  Stable cardiac and mediastinal contours.  Slight interval improvement in diffuse bilateral interstitial pulmonary opacities.  No definite pleural effusion or pneumothorax.  IMPRESSION: Slight interval improvement in bilateral interstitial opacities, most compatible with improving pulmonary edema.    Original Report Authenticated By: Annia Belt, M.D   Dg Chest Port 1 View  10/12/2012   CLINICAL DATA:  Pneumonia, CHF, followup  EXAM: PORTABLE CHEST - 1 VIEW  COMPARISON:  Portable chest x-ray of 10/11/2012  FINDINGS: There is no change in position of the endotracheal tube and left central venous line. There may have been some slight worsening of the degree of moderate pulmonary vascular congestion with cardiomegaly present.  IMPRESSION: 1. Apparent worsening of mild CHF. 2. Stable position of endotracheal tube and left central venous line.   Electronically Signed   By: Dwyane Dee M.D.   On: 10/12/2012 08:19   Dg Chest Port 1 View  10/11/2012   *RADIOLOGY REPORT*  Clinical Data: Pulmonary edema, follow-up  PORTABLE CHEST - 1 VIEW  Comparison: Portable chest x-ray of 10/10/2012  Findings: There is little change in poor aeration with pulmonary edema.  Cardiomegaly is stable.  The endotracheal tube and left central venous line are unchanged in position.  IMPRESSION: No change in poor aeration and moderate pulmonary edema.   Original Report Authenticated By: Dwyane Dee, M.D.   Dg Chest Port 1 View  10/11/2012   CLINICAL DATA:  Check endotracheal tube.  EXAM: PORTABLE CHEST - 1 VIEW  COMPARISON:  CHEST x-ray from earlier the same day.  FINDINGS: Endotracheal tube ends in the mid thoracic trachea. Unchanged positioning of left IJ central venous catheter, tip near the upper SVC. Gastric suction tube crosses the diaphragm.  Unchanged cardiomegaly. Modest improvement in pulmonary edema, still mildly asymmetric. No interval pneumothorax or significant effusion accumulation.  IMPRESSION: 1. Stable positioning of tubes and lines. 2. Improving pulmonary edema.   Electronically Signed   By: Tiburcio Pea   On: 10/11/2012 05:55   Dg Chest Port 1 View  10/10/2012   CLINICAL DATA:  42 year old female with diastolic heart failure.  EXAM: PORTABLE CHEST - 1 VIEW  COMPARISON:  10/09/2012 and earlier.  FINDINGS:  Portable AP semi upright view at 0609 hr. Stable endotracheal tube tip at the level the clavicles. Stable left IJ central line. Stable visible enteric tube.  Stable cardiac size and mediastinal contours. Increased retrocardiac opacity. Indistinctness of pulmonary vasculature and perihilar opacity continues. No large effusion. No pneumothorax.  IMPRESSION: 1. Stable lines and tubes.  2.  Increased left lower lobe collapse or consolidation.  3. Persistent evidence of pulmonary edema.   Electronically Signed   By: Augusto Gamble M.D.   On: 10/10/2012 08:22   Dg Chest Port 1 View  10/09/2012   *RADIOLOGY REPORT*  Clinical Data: Central line placement, ventilated  PORTABLE CHEST - 1 VIEW  Comparison: 10/09/2012  Findings: Left IJ central line tip terminates over the brachiocephalic/SVC junction.  No pneumothorax.  Endotracheal tube is appropriately positioned. Nasogastric tube terminates below the level of the diaphragms but the tip is not included on the film. Patchy pulmonary airspace opacities persist with mild cardiomegaly.  IMPRESSION: Left IJ central line tip over brachiocephalic/SVC junction.  No pneumothorax.   Original Report Authenticated By: Christiana Pellant, M.D.   Dg Chest Port 1 View  10/09/2012   CLINICAL DATA:  Respiratory failure  EXAM: PORTABLE CHEST - 1 VIEW  COMPARISON:  10/08/2012  FINDINGS: Endotracheal tube has its tip 4.5 cm above the Carina. Nasogastric tube enters the stomach. Bilateral airspace density consistent with pulmonary edema persists, but is improved since yesterday. No worsening or new findings. No effusions.  IMPRESSION: Improvement in pulmonary edema pattern.   Electronically Signed   By: Paulina Fusi M.D.   On: 10/09/2012 07:40   Dg Chest Port 1 View  10/08/2012   *RADIOLOGY REPORT*  Clinical Data: Intubation  PORTABLE CHEST - 1 VIEW  Comparison:  Prior radiograph from 10/07/2012  Findings: Endotracheal tube is in place with tip located 4.1 cm above the carina.  Enteric tube  courses into the abdomen. Cardiomegaly is unchanged.  There has been interval worsening of diffuse pulmonary edema.  No pneumothorax.  No definite focal infiltrates are identified.  Osseous structures are unchanged.  IMPRESSION: 1.  Tip of endotracheal tube 4.1 cm above the carina. 2.  Interval worsening of pulmonary edema as compared to 10/07/2012.   Original Report Authenticated By: Rise Mu, M.D.    CBC  Recent Labs Lab 10/16/12 0830 10/17/12 0600 10/18/12 1000 10/19/12 0650 10/20/12 0500  WBC 14.6* 14.8* 16.2* 15.8* 13.5*  HGB 10.0* 10.0* 10.2* 10.2* 10.6*  HCT 30.0* 30.4* 32.3* 33.0* 34.1*  PLT 376 391 416* 415* 410*  MCV 87.2 88.4 90.0 91.2 91.7  MCH 29.1 29.1 28.4 28.2 28.5  MCHC 33.3 32.9 31.6 30.9 31.1  RDW 13.8 14.0 14.9 15.2 15.9*    Chemistries   Recent Labs Lab 10/16/12 0830 10/17/12 0600 10/18/12 0930 10/19/12 0650 10/20/12 0500  NA 133* 131* 134* 134* 137  K 2.9* 4.0 4.2 4.3 4.5  CL 93* 93* 97 97 100  CO2 25 21 22 21 21   GLUCOSE 153* 213* 247* 278* 235*  BUN 92* 96* 91* 84* 73*  CREATININE 2.15* 2.33* 2.24* 2.37* 2.28*  CALCIUM 9.6 9.0 9.6 9.3 8.9   ------------------------------------------------------------------------------------------------------------------ estimated creatinine clearance is 42.5 ml/min (by C-G formula based on Cr of 2.28). ------------------------------------------------------------------------------------------------------------------ No results found for this basename: HGBA1C,  in the last 72 hours ------------------------------------------------------------------------------------------------------------------ No results found for this basename: CHOL, HDL, LDLCALC, TRIG, CHOLHDL, LDLDIRECT,  in the last 72 hours ------------------------------------------------------------------------------------------------------------------ No results found for this basename: TSH, T4TOTAL, FREET3, T3FREE, THYROIDAB,  in the last 72  hours ------------------------------------------------------------------------------------------------------------------ No results found for this basename: VITAMINB12, FOLATE, FERRITIN, TIBC, IRON, RETICCTPCT,  in the last 72 hours  Coagulation profile No results found for this basename: INR, PROTIME,  in the last 168 hours  No results found for this basename: DDIMER,  in the last 72 hours  Cardiac Enzymes No results found for this basename: CK, CKMB, TROPONINI, MYOGLOBIN,  in the last 168 hours ------------------------------------------------------------------------------------------------------------------ No components found with this basename: POCBNP,

## 2012-10-20 NOTE — Progress Notes (Addendum)
Patient ID: Jasmine [name], female   DOB: 01/28/1970, 42 y.o.   MRN: [id number]  SUBJECTIVE:  Jasmine [name] is a 42 y.o [place] American female with history of nonobstructive CAD per cath 2010, HTN, uncontrolled DM and diastolic dysfunction. Admitted with respiratory failure due to PNA/CHF and a/c renal failure.  Extubated 9/18  Still with cough  Breathing easier. Off dopamine 10/13/12.  Renal 10/13/12 Speech evaluation completed with recommendations for  dysphagia 3.   Complaining of fatigue and dyspnea with productive cough. Complaining of  on going nausea and vomiting. Ab CT with no acute pathology except for possible hemorrhagic R ovarian cyst. Diuretics remain on hold. Remains hoarse with upper airway stridor.  Creatinine now improving  2.1>2.3>2.37>2.28 WBC 14.6>  14.8>16.2>15.8>13.5 UA ok  Sputum 9/14 >> Moderate beta hemolytic Streptococcus  ANTIBIOTICS:  Rocephin 9/14>>> 9/18  Azithro 9/14>>> 9/18  Ancef 9/18 >>> 9/20 Levaquin 9/23 Diflucan 9/24 Flagyl 9/25   . albuterol  2.5 mg Nebulization TID  . amLODipine  10 mg Oral Daily  . antiseptic oral rinse  15 mL Mouth Rinse BID  . aspirin  81 mg Oral Daily  . atorvastatin  40 mg Oral q1800  . darbepoetin (ARANESP) injection - NON-DIALYSIS  100 mcg Subcutaneous Q Wed-1800  . fluconazole (DIFLUCAN) IV  200 mg Intravenous Q24H  . heparin subcutaneous  5,000 Units Subcutaneous Q8H  . hydrALAZINE  25 mg Oral Q6H  . insulin aspart  0-20 Units Subcutaneous Q6H  . insulin glargine  10 Units Subcutaneous Q24H  . ipratropium  0.5 mg Nebulization TID  . levofloxacin (LEVAQUIN) IV  750 mg Intravenous Q48H  . metronidazole  500 mg Intravenous Q8H  . pantoprazole  40 mg Oral QHS  . sodium chloride  10 mL Intracatheter Q12H      Filed Vitals:   10/19/12 2001 10/19/12 2038 10/20/12 0409 10/20/12 0509  BP:  149/64 129/56 138/61  Pulse:  104 84   Temp:  98.5 F (36.9 C) 98.6 F (37 C)   TempSrc:  Oral Oral   Resp:  20 18   Height:       Weight:   265 lb 1.6 oz (120.249 kg)   SpO2: 99% 99% 98%     Intake/Output Summary (Last 24 hours) at 10/20/12 0801 Last data filed at 10/19/12 2300  Gross per 24 hour  Intake      0 ml  Output   1150 ml  Net  -1150 ml    LABS: Basic Metabolic Panel:  Recent Labs  40/98/11 0650 10/20/12 0500  NA 134* 137  K 4.3 4.5  CL 97 100  CO2 21 21  GLUCOSE 278* 235*  BUN 84* 73*  CREATININE 2.37* 2.28*  CALCIUM 9.3 8.9   Liver Function Tests: No results found for this basename: AST, ALT, ALKPHOS, BILITOT, PROT, ALBUMIN,  in the last 72 hours CBC:  Recent Labs  10/19/12 0650 10/20/12 0500  WBC 15.8* 13.5*  HGB 10.2* 10.6*  HCT 33.0* 34.1*  MCV 91.2 91.7  PLT 415* 410*    RADIOLOGY: Dg Chest 2 View  10/07/2012   CLINICAL DATA:  Chest pain. Shortness of breast.  EXAM: CHEST  2 VIEW  COMPARISON:  01/17/2012.  FINDINGS: Mild cardiopericardial enlargement. Perihilar vascular fullness with fissural thickening and probable Kerley lines in the lateral projection. There may be trace pleural effusions. No pneumothorax.  IMPRESSION: Cardiomegaly and pulmonary edema.   Electronically Signed   By: Tiburcio Pea   On: 10/07/2012  22:08   Dg Chest Port 1 View  10/09/2012   CLINICAL DATA:  Respiratory failure  EXAM: PORTABLE CHEST - 1 VIEW  COMPARISON:  10/08/2012  FINDINGS: Endotracheal tube has its tip 4.5 cm above the Carina. Nasogastric tube enters the stomach. Bilateral airspace density consistent with pulmonary edema persists, but is improved since yesterday. No worsening or new findings. No effusions.  IMPRESSION: Improvement in pulmonary edema pattern.   Electronically Signed   By: Paulina Fusi M.D.   On: 10/09/2012 07:40   Dg Chest Port 1 View  10/08/2012   *RADIOLOGY REPORT*  Clinical Data: Intubation  PORTABLE CHEST - 1 VIEW  Comparison: Prior radiograph from 10/07/2012  Findings: Endotracheal tube is in place with tip located 4.1 cm above the carina.  Enteric tube courses into  the abdomen. Cardiomegaly is unchanged.  There has been interval worsening of diffuse pulmonary edema.  No pneumothorax.  No definite focal infiltrates are identified.  Osseous structures are unchanged.  IMPRESSION: 1.  Tip of endotracheal tube 4.1 cm above the carina. 2.  Interval worsening of pulmonary edema as compared to 10/07/2012.   Original Report Authenticated By: Rise Mu, M.D.    PHYSICAL EXAM General: sitting in chair. Looks good. + hoarse Neck: Unable to see JVP no thyromegaly or thyroid nodule.  Lungs: clear upper airway stridor CV:  Heart regular S1/S2, no S3/S4, no murmur.   No carotid bruit.  Normal pedal pulses.  Abdomen: obese Soft, nontender, no hepatosplenomegaly, no distention. LLQ tender. No rebound Extremities: No clubbing or cyanosis. No edema  TELEMETRY:  NSR  ASSESSMENT AND PLAN: 42 yo with history of severe OSA, diastolic CHF EF 55-60% 10/08/12, CKD admitted with acute on chronic diastolic CHF and intubated for pulmonary edema. Extubated 10/13/12 .   1. Acute on chronic diastolic HF: Diuretics on hold.  Weight down 1 pound (down 20 pounds). Creatinine now plateued. 2.1>2.13>2.37>2.28.   2. AKI on CKD: Renal function at baseline Cr = 2.0 based on labs from 9/19. Creatinine plateued 2.1>2.3 >2.37>2.28  3. Elevated troponin: Peak at 3.21. Suspect demand ischemia vs HF. She does ahve apical wall motion abnormality.  She is on ASA and statin. Given renal failure would not cath at this point.   4. Pulmonary: CAP (beta hemolytic strep group B) remains afebrile, severe OSA and presumed asthma. Continue levaquin.  She continues to refuse CPAP. Will need PFTs soon after d/c when more stable and follow up with Dr Vassie Loll. F/U scheduled 11/24/12 at 10:00  6. Hypernatremia/hypokalemia. Recheck labs. Supp K+ as needed.  Jasmine CLEGG, NP-C 10/20/2012 8:01 AM Advanced Heart Failure Team Pager [phone] (M-F; 7a - 4p)  Please contact [facility] for night-coverage  after hours (4p -7a ) and weekends on [email or web address]   Patient seen and examined with Tonye Becket, NP. We discussed all aspects of the encounter. I agree with the assessment and plan as stated above.   Results of CT noted.   Remains stable from HF standpoint. Creatinine coming back to baseline. Weight stable. Would avoid aggressive IV hydration if possible. Can restart diuretics when taking po better. With her hoarseness and upper airway stridor I suspect she has edema of her vocal cords related to intubation. If persists can contact ENT for laryngoscope evaluation.  I discussed case with Dr. Thedore Mins and he has graciously accepted her to [facility] as primary service. We will make the change. We will continue to follow.  Daniel [name],MD 5:22 PM

## 2012-10-20 NOTE — Progress Notes (Signed)
Physical Therapy Treatment Patient Details Name: Jasmine [name] MRN: [id number] DOB: July 29, 1970 Today's Date: 10/20/2012 Time: 2956-2130 PT Time Calculation (min): 24 min  PT Assessment / Plan / Recommendation  History of Present Illness 42 years female with diastolic heart failure, CKD, HTN, DM, OSA, admitted 9/13 with sudden onset of SOB and hypoxemia. Chest X ray consistent with acute pulmonary edema. Did not tolerate BiPAP and required endotracheal intubation.    PT Comments   Pt moving well.  Does become SOB with activity but Sp02 99% RA.   Educated pt on pacing herself & allowing for rest breaks if/when needed with activity.     Follow Up Recommendations  Home health PT     Does the patient have the potential to tolerate intense rehabilitation     Barriers to Discharge        Equipment Recommendations  None recommended by PT    Recommendations for Other Services    Frequency Min 3X/week   Progress towards PT Goals Progress towards PT goals: Progressing toward goals  Plan Current plan remains appropriate    Precautions / Restrictions Precautions Precautions: Fall Restrictions Weight Bearing Restrictions: No       Mobility  Bed Mobility Bed Mobility: Not assessed Transfers Transfers: Sit to Stand;Stand to Sit Sit to Stand: 7: Independent;With upper extremity assist;With armrests;From chair/3-in-1 Stand to Sit: 7: Independent;With upper extremity assist;With armrests;To chair/3-in-1 Ambulation/Gait Ambulation/Gait Assistance: 5: Supervision Ambulation Distance (Feet): 400 Feet Assistive device: None Ambulation/Gait Assistance Details: No seated rest break needed.  Does become SOB + wheezing but Sp02 99%.  Cues to take deep breathes.   Gait Pattern: Step-through pattern;Decreased stride length;Wide base of support Gait velocity: decreased Stairs: No     PT Goals (current goals can now be found in the care plan section) Acute Rehab PT Goals PT Goal Formulation:  With patient Time For Goal Achievement: 10/30/12 Potential to Achieve Goals: Good  Visit Information  Last PT Received On: 10/20/12 Assistance Needed: +1 History of Present Illness: 41 years female with diastolic heart failure, CKD, HTN, DM, OSA, admitted 9/13 with sudden onset of SOB and hypoxemia. Chest X ray consistent with acute pulmonary edema. Did not tolerate BiPAP and required endotracheal intubation.     Subjective Data      Cognition  Cognition Arousal/Alertness: Awake/alert Behavior During Therapy: WFL for tasks assessed/performed Overall Cognitive Status: Within Functional Limits for tasks assessed    Balance  Static Standing Balance Static Standing - Balance Support: No upper extremity supported Static Standing - Level of Assistance: 7: Independent  End of Session PT - End of Session Equipment Utilized During Treatment: Gait belt Activity Tolerance: Patient tolerated treatment well Patient left: in chair;with call bell/phone within reach Nurse Communication: Mobility status   GP     Lara Mulch 10/20/2012, 1:48 PM  Verdell Face, PTA [phone] 10/20/2012

## 2012-10-20 NOTE — Progress Notes (Signed)
Pt told RT that she would notify RT later if she decides to wear CPAP tonight.  Pt also stated she may not wear tonight

## 2012-10-20 NOTE — Progress Notes (Signed)
Amy Clegg PA informed pt had some N/V with amt and description given and also pt not due for any zofran at this time just got it earlier this am.      Pt also declined to have meds now stated want to wait a little.  Spoke with pt instructed that Amy PA been to see her and will come back with Dr. Macario Carls.  Will continue to monitor.  Amanda Pea, Charity fundraiser.

## 2012-10-20 NOTE — Progress Notes (Signed)
Pt vomited light green emesis small amt and with a speck of blood this am.  Notified R. Barrett, PA and instructed she will notify Amy, PA who is working with Dr. Shirlee Latch today.  Will continue to monitor.  Amanda Pea, Charity fundraiser.

## 2012-10-20 NOTE — Progress Notes (Signed)
Pt requesting some thing to eat.  Dr. Chesley Noon informed and instructed that she will put in a diet order for pt.  Amanda Pea, Charity fundraiser.

## 2012-10-20 NOTE — Progress Notes (Signed)
Instructed by Charge nurse that Dr. Gala Romney had called and instructed her that Dr. Nancee Liter , [facility] is covering  Pt care, which was not indicated on the chat this am and reason for notifying  Amy, PA of pt's status this am.  Dr Nancee Liter made aware pt had n/v this am.  MD instructed to continue to monitor.  Noted no further vomiting and stated feels much better.  Will continue to monitor.   Amanda Pea, Charity fundraiser.

## 2012-10-21 LAB — BASIC METABOLIC PANEL
BUN: 63 mg/dL — ABNORMAL HIGH (ref 6–23)
CO2: 18 mEq/L — ABNORMAL LOW (ref 19–32)
Calcium: 8.8 mg/dL (ref 8.4–10.5)
Chloride: 100 mEq/L (ref 96–112)
GFR calc Af Amer: 28 mL/min — ABNORMAL LOW (ref >90)
Potassium: 4.8 mEq/L (ref 3.5–5.1)

## 2012-10-21 LAB — GLUCOSE, CAPILLARY
Glucose-Capillary: 209 mg/dL — ABNORMAL HIGH (ref 70–99)
Glucose-Capillary: 158 mg/dL — ABNORMAL HIGH (ref 70–99)
Glucose-Capillary: 248 mg/dL — ABNORMAL HIGH (ref 70–99)

## 2012-10-21 LAB — CBC
HCT: 32.3 % — ABNORMAL LOW (ref 36.0–46.0)
Hemoglobin: 10.3 g/dL — ABNORMAL LOW (ref 12.0–15.0)
MCH: 29.1 pg (ref 26.0–34.0)
MCV: 91.2 fL (ref 78.0–100.0)
RDW: 15.8 % — ABNORMAL HIGH (ref 11.5–15.5)
WBC: 12.3 10*3/uL — ABNORMAL HIGH (ref 4.0–10.5)

## 2012-10-21 MED ORDER — INSULIN ASPART 100 UNIT/ML ~~LOC~~ SOLN
0.0000 [IU] | Freq: Three times a day (TID) | SUBCUTANEOUS | Status: DC
  Administered 2012-10-21 – 2012-10-22 (×2): 11 [IU] via SUBCUTANEOUS

## 2012-10-21 MED ORDER — INSULIN ASPART 100 UNIT/ML ~~LOC~~ SOLN
0.0000 [IU] | Freq: Every day | SUBCUTANEOUS | Status: DC
  Administered 2012-10-21: 2 [IU] via SUBCUTANEOUS

## 2012-10-21 MED ORDER — METOCLOPRAMIDE HCL 5 MG/ML IJ SOLN
5.0000 mg | Freq: Three times a day (TID) | INTRAMUSCULAR | Status: DC
  Administered 2012-10-21 – 2012-10-22 (×3): 5 mg via INTRAVENOUS
  Filled 2012-10-21 (×6): qty 1

## 2012-10-21 NOTE — Progress Notes (Signed)
[facility]                                                                                Patient Demographics  Jasmine [name], is a 42 y.o. female, DOB - 1970-08-11, YNW:[id number]  Admit date - 10/07/2012   Admitting Physician Yaakov Guthrie, MD  Outpatient Primary MD for the patient is Dois Davenport., MD  LOS - 14   Chief Complaint  Patient presents with  . Chest Pain        Assessment & Plan  1.   Abdominal pain with Nausea and vomiting    -Will continue patient on Levaquin and Flagyl.    -Leukocytosis improving and trending downward.   -Continue Zofran as well as pain medications.   -CT scan of the abdomen did show an ovarian cyst with possible uterine fibroid.     -Possible gastroparesis, will start Reglan 5mg  q8hours.   -GI (Dr. Loreta Ave) consulted.  -Gastric emptying study ordered.   2.  DIABETES MELLITUS, TYPE I with retinopathy  - Will continue Accu-Cheks along with insulin sliding scale.  Lantus was increased to 20 units daily.  Blood sugars are still in the 200s, however, patient remains to be on clear liquids.    3.  Hypertension  -Continue hydralazine and amlodipine.  4.  OSA (obstructive sleep apnea) - patient does use CPAP at night. Spoke with her at length regarding the compliance with using the CPAP.  5. Acute respiratory failure with hypoxia secondary to community-acquired pneumonia, currently on Levaquin.  6. Acute on chronic diastolic heart failure  Being followed by cardiology. Diuretics are currently on hold due to renal function.   7. Acute on chronic renal failure  Nephrology has been following the patient.  Cr 2.38.    Currently on clear liquid diet.   Code Status: Full  Family Communication: None  Disposition Plan: We are consulted.  Currently admitted.    Procedures Echo 10/08/2012 Study Conclusions  - Left ventricle: Technically difficult study, with suboptimal endocardial visualization. Would recommend contrast enhancement for  more accurate regional wall motion assessment if deemed necessary. The cavity size was normal. Wall thickness was increased in a pattern of mild LVH. Systolic function was normal. The estimated ejection fraction was in the range of 55% to 60%. Features are consistent with a pseudonormal left ventricular filling pattern, with concomitant abnormal relaxation and increased filling pressure (grade 2 diastolic dysfunction). Doppler parameters are consistent with elevated ventricular end-diastolic filling pressure. - Regional wall motion abnormality: Mild hypokinesis of the apical septal and apical myocardium. - Aortic valve: Mildly thickened leaflets. - Mitral valve: Calcified annulus. Mildly thickened leaflets . - Left atrium: The atrium was mildly dilated. - Inferior vena cava: The vessel was dilated; the respirophasic diameter changes were blunted (< 50%); findings are consistent with elevated central venous pressure.  Consults Nephrology, Pulmonology, Cardiology, Gastroenterology   DVT Prophylaxis  Able to ambulate   Lab Results  Component Value Date   PLT 410* 10/20/2012    Medications  Scheduled Meds: . albuterol  2.5 mg Nebulization TID  . amLODipine  10 mg Oral Daily  . antiseptic oral rinse  15 mL Mouth Rinse BID  .  aspirin  81 mg Oral Daily  . atorvastatin  40 mg Oral q1800  . darbepoetin (ARANESP) injection - NON-DIALYSIS  100 mcg Subcutaneous Q Wed-1800  . fluconazole (DIFLUCAN) IV  200 mg Intravenous Q24H  . heparin subcutaneous  5,000 Units Subcutaneous Q8H  . hydrALAZINE  25 mg Oral Q6H  . insulin aspart  0-20 Units Subcutaneous Q6H  . insulin glargine  20 Units Subcutaneous Q24H  . ipratropium  0.5 mg Nebulization TID  . levofloxacin (LEVAQUIN) IV  750 mg Intravenous Q48H  . metronidazole  500 mg Intravenous Q8H  . pantoprazole  40 mg Oral QHS  . sodium chloride  10 mL Intracatheter Q12H   Continuous Infusions:   PRN Meds:.acetaminophen, albuterol,  benzonatate, HYDROcodone-acetaminophen, ipratropium, morphine injection, ondansetron (ZOFRAN) IV, phenol, polyethylene glycol, sodium chloride  Antibiotics    Anti-infectives   Start     Dose/Rate Route Frequency Ordered Stop   10/20/12 1000  levofloxacin (LEVAQUIN) IVPB 750 mg     750 mg 100 mL/hr over 90 Minutes Intravenous Every 48 hours 10/19/12 0839 10/24/12 0959   10/19/12 1200  fluconazole (DIFLUCAN) IVPB 200 mg     200 mg 100 mL/hr over 60 Minutes Intravenous Every 24 hours 10/18/12 1128 10/26/12 1159   10/19/12 1200  metroNIDAZOLE (FLAGYL) IVPB 500 mg     500 mg 100 mL/hr over 60 Minutes Intravenous Every 8 hours 10/19/12 1141     10/18/12 1200  fluconazole (DIFLUCAN) IVPB 400 mg     400 mg 100 mL/hr over 120 Minutes Intravenous  Once 10/18/12 1128 10/18/12 1526   10/17/12 1000  levofloxacin (LEVAQUIN) IVPB 750 mg  Status:  Discontinued     750 mg 100 mL/hr over 90 Minutes Intravenous Every 24 hours 10/17/12 0925 10/19/12 0839   10/12/12 0900  ceFAZolin (ANCEF) IVPB 2 g/50 mL premix     2 g 100 mL/hr over 30 Minutes Intravenous 3 times per day 10/12/12 0731 10/14/12 1639   10/08/12 1300  cefTRIAXone (ROCEPHIN) 2 g in dextrose 5 % 50 mL IVPB  Status:  Discontinued     2 g 100 mL/hr over 30 Minutes Intravenous Every 24 hours 10/08/12 1111 10/12/12 0728   10/08/12 1200  azithromycin (ZITHROMAX) 500 mg in dextrose 5 % 250 mL IVPB  Status:  Discontinued     500 mg 250 mL/hr over 60 Minutes Intravenous Every 24 hours 10/08/12 1111 10/12/12 0728       Time Spent in minutes   35 minutes   Caeleigh Prohaska D.O. on 10/21/2012 at 7:14 AM  Between 7am to 7pm - Pager - [phone]  After 7pm go to [email or web address] - password [email or web address]  And look for the night coverage person covering for me after hours  [facility] Office  [phone]    Subjective:   Jasmine [name] was seen and examined today. Patient continues to complain of nausea and vomiting. She did have one  episode of vomiting which was witnessed today.  However, patient states her nausea does get better as the day progresses.  Patient currently denies SOB, chest pain, diarrhea, abdominal pain, dizziness.   Objective:   Filed Vitals:   10/20/12 1814 10/20/12 2101 10/20/12 2134 10/21/12 0512  BP: 152/62  142/59 134/49  Pulse:   103 95  Temp:   98.6 F (37 C) 97.3 F (36.3 C)  TempSrc:   Oral Oral  Resp:   20 18  Height:      Weight:    121.8 kg (268  lb 8.3 oz)  SpO2:  100% 99% 99%    Wt Readings from Last 3 Encounters:  10/21/12 121.8 kg (268 lb 8.3 oz)  07/24/12 123.832 kg (273 lb)  03/30/12 126.145 kg (278 lb 1.6 oz)     Intake/Output Summary (Last 24 hours) at 10/21/12 0714 Last data filed at 10/20/12 2232  Gross per 24 hour  Intake   1270 ml  Output   1100 ml  Net    170 ml    Exam General: Awake Alert, Oriented X 3, Normal affect, Well developed HEENT: [place]/AT,PERRAL  Neck: Supple Neck,No JVD, No cervical lymphadenopathy appriciated.  Respiratory: Symmetrical Chest wall movement, Good air movement bilaterally, CTAB  Upper airway wheezing noted Cardiovascular: RRR, +S1/S2, No Gallops,Rubs or new Murmurs  Abdomen: Soft, obese, tender in LLQ, nondistended, positive bowel sounds, no rebound Extremities: No edema. No Cyanosis, Clubbing.  Neuro: Grossly intact, nonfocal.    Data Review   Micro Results No results found for this or any previous visit (from the past 240 hour(s)).  Radiology Reports Ct Abdomen Pelvis Wo Contrast  10/19/2012   CLINICAL DATA:  Left lower quadrant abdominal pain, nausea and vomiting. Elevated white blood cell count.  EXAM: CT ABDOMEN AND PELVIS WITHOUT CONTRAST  TECHNIQUE: Multidetector CT imaging of the abdomen and pelvis was performed following the standard protocol without intravenous contrast.  COMPARISON:  08/05/2009  FINDINGS: Unenhanced CT was performed per clinician order. Lack of IV contrast limits sensitivity and specificity,  especially for evaluation of abdominal/pelvic solid viscera.  The lung bases are clear. Heart size is normal. No pericardial or pleural effusion.  Splenic granulomas are reidentified. Cholecystectomy clips are noted. Liver, adrenal glands, and pancreas are unremarkable. Punctate left renal probable vascular calcifications are noted. No hydronephrosis or evidence for significant radiopaque renal or ureteral calculi.  Lobulated uterine contour reidentified with IUD in place. The ovaries are relatively midline with respect to each other and inhomogeneously enlarged on the right with possible underlying approximately 5 cm lesion measuring 20 Hounsfield units. This most likely represents a hemorrhagic cyst. No pelvic free fluid. The appendix and bowel are unremarkable. Moderate atheromatous aortic calcification is noted. The bladder is normal.  Representative 7 mm retroperitoneal periaortic lymph node image 47 is stable.  No acute osseous abnormality.  IMPRESSION: Lobulated uterine contour compatible with previously seen uterine fibroid.  The ovaries are relatively medial in location with probable right hemorrhagic ovarian cyst, although endometrioma can have a similar appearance and endometriosis can account for this apparent ovarian position ( "Kissing ovaries" ).  No acute intra-abdominal or pelvic pathology.   Electronically Signed   By: Christiana Pellant M.D.   On: 10/19/2012 21:07   Dg Chest 2 View  10/17/2012   CLINICAL DATA:  Chest pain, cough, congestion  EXAM: CHEST  2 VIEW  COMPARISON:  10/13/2012  FINDINGS: Cardiomediastinal silhouette is stable. No pulmonary edema. Mild elevation of the right hemidiaphragm again noted. Central mild bronchitic changes. Bony thorax is stable. No segmental infiltrate.  IMPRESSION: No segmental infiltrate or pulmonary edema. Central mild bronchitic changes.   Electronically Signed   By: Natasha Mead   On: 10/17/2012 13:46   Dg Chest 2 View  10/07/2012   CLINICAL DATA:  Chest  pain. Shortness of breast.  EXAM: CHEST  2 VIEW  COMPARISON:  01/17/2012.  FINDINGS: Mild cardiopericardial enlargement. Perihilar vascular fullness with fissural thickening and probable Kerley lines in the lateral projection. There may be trace pleural effusions. No pneumothorax.  IMPRESSION:  Cardiomegaly and pulmonary edema.   Electronically Signed   By: Tiburcio Pea   On: 10/07/2012 22:08   Dg Chest Port 1 View  10/13/2012   *RADIOLOGY REPORT*  Clinical Data: Evaluate congestive heart failure.  PORTABLE CHEST - 1 VIEW  Comparison: Chest radiograph 10/12/2012  Findings: Left-sided central venous catheter tip projects over the superior vena cava, unchanged.  Interval extubation.  Stable cardiac and mediastinal contours.  Slight interval improvement in diffuse bilateral interstitial pulmonary opacities.  No definite pleural effusion or pneumothorax.  IMPRESSION: Slight interval improvement in bilateral interstitial opacities, most compatible with improving pulmonary edema.   Original Report Authenticated By: Annia Belt, M.D   Dg Chest Port 1 View  10/12/2012   CLINICAL DATA:  Pneumonia, CHF, followup  EXAM: PORTABLE CHEST - 1 VIEW  COMPARISON:  Portable chest x-ray of 10/11/2012  FINDINGS: There is no change in position of the endotracheal tube and left central venous line. There may have been some slight worsening of the degree of moderate pulmonary vascular congestion with cardiomegaly present.  IMPRESSION: 1. Apparent worsening of mild CHF. 2. Stable position of endotracheal tube and left central venous line.   Electronically Signed   By: Dwyane Dee M.D.   On: 10/12/2012 08:19   Dg Chest Port 1 View  10/11/2012   *RADIOLOGY REPORT*  Clinical Data: Pulmonary edema, follow-up  PORTABLE CHEST - 1 VIEW  Comparison: Portable chest x-ray of 10/10/2012  Findings: There is little change in poor aeration with pulmonary edema.  Cardiomegaly is stable.  The endotracheal tube and left central venous line are  unchanged in position.  IMPRESSION: No change in poor aeration and moderate pulmonary edema.   Original Report Authenticated By: Dwyane Dee, M.D.   Dg Chest Port 1 View  10/11/2012   CLINICAL DATA:  Check endotracheal tube.  EXAM: PORTABLE CHEST - 1 VIEW  COMPARISON:  CHEST x-ray from earlier the same day.  FINDINGS: Endotracheal tube ends in the mid thoracic trachea. Unchanged positioning of left IJ central venous catheter, tip near the upper SVC. Gastric suction tube crosses the diaphragm.  Unchanged cardiomegaly. Modest improvement in pulmonary edema, still mildly asymmetric. No interval pneumothorax or significant effusion accumulation.  IMPRESSION: 1. Stable positioning of tubes and lines. 2. Improving pulmonary edema.   Electronically Signed   By: Tiburcio Pea   On: 10/11/2012 05:55   Dg Chest Port 1 View  10/10/2012   CLINICAL DATA:  42 year old female with diastolic heart failure.  EXAM: PORTABLE CHEST - 1 VIEW  COMPARISON:  10/09/2012 and earlier.  FINDINGS: Portable AP semi upright view at 0609 hr. Stable endotracheal tube tip at the level the clavicles. Stable left IJ central line. Stable visible enteric tube.  Stable cardiac size and mediastinal contours. Increased retrocardiac opacity. Indistinctness of pulmonary vasculature and perihilar opacity continues. No large effusion. No pneumothorax.  IMPRESSION: 1. Stable lines and tubes.  2.  Increased left lower lobe collapse or consolidation.  3. Persistent evidence of pulmonary edema.   Electronically Signed   By: Augusto Gamble M.D.   On: 10/10/2012 08:22   Dg Chest Port 1 View  10/09/2012   *RADIOLOGY REPORT*  Clinical Data: Central line placement, ventilated  PORTABLE CHEST - 1 VIEW  Comparison: 10/09/2012  Findings: Left IJ central line tip terminates over the brachiocephalic/SVC junction.  No pneumothorax.  Endotracheal tube is appropriately positioned. Nasogastric tube terminates below the level of the diaphragms but the tip is not included on  the film.  Patchy pulmonary airspace opacities persist with mild cardiomegaly.  IMPRESSION: Left IJ central line tip over brachiocephalic/SVC junction.  No pneumothorax.   Original Report Authenticated By: Christiana Pellant, M.D.   Dg Chest Port 1 View  10/09/2012   CLINICAL DATA:  Respiratory failure  EXAM: PORTABLE CHEST - 1 VIEW  COMPARISON:  10/08/2012  FINDINGS: Endotracheal tube has its tip 4.5 cm above the Carina. Nasogastric tube enters the stomach. Bilateral airspace density consistent with pulmonary edema persists, but is improved since yesterday. No worsening or new findings. No effusions.  IMPRESSION: Improvement in pulmonary edema pattern.   Electronically Signed   By: Paulina Fusi M.D.   On: 10/09/2012 07:40   Dg Chest Port 1 View  10/08/2012   *RADIOLOGY REPORT*  Clinical Data: Intubation  PORTABLE CHEST - 1 VIEW  Comparison: Prior radiograph from 10/07/2012  Findings: Endotracheal tube is in place with tip located 4.1 cm above the carina.  Enteric tube courses into the abdomen. Cardiomegaly is unchanged.  There has been interval worsening of diffuse pulmonary edema.  No pneumothorax.  No definite focal infiltrates are identified.  Osseous structures are unchanged.  IMPRESSION: 1.  Tip of endotracheal tube 4.1 cm above the carina. 2.  Interval worsening of pulmonary edema as compared to 10/07/2012.   Original Report Authenticated By: Rise Mu, M.D.    CBC  Recent Labs Lab 10/16/12 0830 10/17/12 0600 10/18/12 1000 10/19/12 0650 10/20/12 0500  WBC 14.6* 14.8* 16.2* 15.8* 13.5*  HGB 10.0* 10.0* 10.2* 10.2* 10.6*  HCT 30.0* 30.4* 32.3* 33.0* 34.1*  PLT 376 391 416* 415* 410*  MCV 87.2 88.4 90.0 91.2 91.7  MCH 29.1 29.1 28.4 28.2 28.5  MCHC 33.3 32.9 31.6 30.9 31.1  RDW 13.8 14.0 14.9 15.2 15.9*    Chemistries   Recent Labs Lab 10/16/12 0830 10/17/12 0600 10/18/12 0930 10/19/12 0650 10/20/12 0500  NA 133* 131* 134* 134* 137  K 2.9* 4.0 4.2 4.3 4.5  CL 93* 93*  97 97 100  CO2 25 21 22 21 21   GLUCOSE 153* 213* 247* 278* 235*  BUN 92* 96* 91* 84* 73*  CREATININE 2.15* 2.33* 2.24* 2.37* 2.28*  CALCIUM 9.6 9.0 9.6 9.3 8.9   ------------------------------------------------------------------------------------------------------------------ estimated creatinine clearance is 42.8 ml/min (by C-G formula based on Cr of 2.28). ------------------------------------------------------------------------------------------------------------------ No results found for this basename: HGBA1C,  in the last 72 hours ------------------------------------------------------------------------------------------------------------------ No results found for this basename: CHOL, HDL, LDLCALC, TRIG, CHOLHDL, LDLDIRECT,  in the last 72 hours ------------------------------------------------------------------------------------------------------------------ No results found for this basename: TSH, T4TOTAL, FREET3, T3FREE, THYROIDAB,  in the last 72 hours ------------------------------------------------------------------------------------------------------------------ No results found for this basename: VITAMINB12, FOLATE, FERRITIN, TIBC, IRON, RETICCTPCT,  in the last 72 hours  Coagulation profile No results found for this basename: INR, PROTIME,  in the last 168 hours  No results found for this basename: DDIMER,  in the last 72 hours  Cardiac Enzymes No results found for this basename: CK, CKMB, TROPONINI, MYOGLOBIN,  in the last 168 hours ------------------------------------------------------------------------------------------------------------------ No components found with this basename: POCBNP,

## 2012-10-21 NOTE — Progress Notes (Signed)
Pt able to tolerate 2 meals today with no GI upset. States she is feeling much better.

## 2012-10-21 NOTE — Progress Notes (Signed)
SUBJECTIVE:  No chest pain.  Still with cough   PHYSICAL EXAM Filed Vitals:   10/20/12 2101 10/20/12 2134 10/21/12 0512 10/21/12 0954  BP:  142/59 134/49 148/66  Pulse:  103 95 98  Temp:  98.6 F (37 C) 97.3 F (36.3 C) 98.2 F (36.8 C)  TempSrc:  Oral Oral Oral  Resp:  20 18 20   Height:      Weight:   268 lb 8.3 oz (121.8 kg)   SpO2: 100% 99% 99% 100%   General:  No distress Lungs:  Clear Heart:  RRR Abdomen:  Positive bowel sounds, no rebound no guarding Extremities:  Trace edema  LABS: Lab Results  Component Value Date   TROPONINI 0.71* 10/09/2012   Results for orders placed during the hospital encounter of 10/07/12 (from the past 24 hour(s))  GLUCOSE, CAPILLARY     Status: Abnormal   Collection Time    10/20/12  6:02 PM      Result Value Range   Glucose-Capillary 244 (*) 70 - 99 mg/dL  GLUCOSE, CAPILLARY     Status: Abnormal   Collection Time    10/20/12  9:25 PM      Result Value Range   Glucose-Capillary 223 (*) 70 - 99 mg/dL   Comment 1 Notify RN    GLUCOSE, CAPILLARY     Status: Abnormal   Collection Time    10/21/12 12:27 AM      Result Value Range   Glucose-Capillary 191 (*) 70 - 99 mg/dL  BASIC METABOLIC PANEL     Status: Abnormal   Collection Time    10/21/12  5:00 AM      Result Value Range   Sodium 135  135 - 145 mEq/L   Potassium 4.8  3.5 - 5.1 mEq/L   Chloride 100  96 - 112 mEq/L   CO2 18 (*) 19 - 32 mEq/L   Glucose, Bld 210 (*) 70 - 99 mg/dL   BUN 63 (*) 6 - 23 mg/dL   Creatinine, Ser 2.83 (*) 0.50 - 1.10 mg/dL   Calcium 8.8  8.4 - 15.1 mg/dL   GFR calc non Af Amer 24 (*) >90 mL/min   GFR calc Af Amer 28 (*) >90 mL/min  CBC     Status: Abnormal   Collection Time    10/21/12  5:00 AM      Result Value Range   WBC 12.3 (*) 4.0 - 10.5 K/uL   RBC 3.54 (*) 3.87 - 5.11 MIL/uL   Hemoglobin 10.3 (*) 12.0 - 15.0 g/dL   HCT 76.1 (*) 60.7 - 37.1 %   MCV 91.2  78.0 - 100.0 fL   MCH 29.1  26.0 - 34.0 pg   MCHC 31.9  30.0 - 36.0 g/dL   RDW  06.2 (*) 69.4 - 15.5 %   Platelets 412 (*) 150 - 400 K/uL  GLUCOSE, CAPILLARY     Status: Abnormal   Collection Time    10/21/12  6:18 AM      Result Value Range   Glucose-Capillary 209 (*) 70 - 99 mg/dL  GLUCOSE, CAPILLARY     Status: Abnormal   Collection Time    10/21/12 11:04 AM      Result Value Range   Glucose-Capillary 158 (*) 70 - 99 mg/dL   Comment 1 Documented in Chart     Comment 2 Notify RN      Intake/Output Summary (Last 24 hours) at 10/21/12 1256 Last data filed at 10/20/12  2232  Gross per 24 hour  Intake   1120 ml  Output   1100 ml  Net     20 ml    ASSESSMENT AND PLAN:  Acute on chronic diastolic HF: Diuretics on hold. Creat is still mildly elevated.  No change in therapy today.  AKI on CKD: See above  Elevated troponin:  Given CKD there are no plans for a cardiac cath.    Rollene Rotunda 10/21/2012 12:56 PM

## 2012-10-22 ENCOUNTER — Encounter (HOSPITAL_COMMUNITY): Payer: Self-pay | Admitting: Internal Medicine

## 2012-10-22 DIAGNOSIS — K3184 Gastroparesis: Secondary | ICD-10-CM

## 2012-10-22 LAB — BASIC METABOLIC PANEL
BUN: 56 mg/dL — ABNORMAL HIGH (ref 6–23)
Calcium: 8.8 mg/dL (ref 8.4–10.5)
Creatinine, Ser: 2.32 mg/dL — ABNORMAL HIGH (ref 0.50–1.10)
GFR calc non Af Amer: 25 mL/min — ABNORMAL LOW (ref >90)
Glucose, Bld: 291 mg/dL — ABNORMAL HIGH (ref 70–99)

## 2012-10-22 LAB — GLUCOSE, CAPILLARY: Glucose-Capillary: 262 mg/dL — ABNORMAL HIGH (ref 70–99)

## 2012-10-22 MED ORDER — BENZONATATE 100 MG PO CAPS: 100.0000 mg | ORAL_CAPSULE | Freq: Two times a day (BID) | ORAL | Status: DC | PRN

## 2012-10-22 MED ORDER — ATORVASTATIN CALCIUM 40 MG PO TABS: 40.0000 mg | ORAL_TABLET | Freq: Every day | ORAL | Status: DC

## 2012-10-22 MED ORDER — PANTOPRAZOLE SODIUM 40 MG PO TBEC: 40.0000 mg | DELAYED_RELEASE_TABLET | Freq: Every day | ORAL | Status: DC

## 2012-10-22 MED ORDER — HYDRALAZINE HCL 25 MG PO TABS: 25.0000 mg | ORAL_TABLET | Freq: Four times a day (QID) | ORAL | Status: DC

## 2012-10-22 MED ORDER — LEVOFLOXACIN 750 MG PO TABS: 750.0000 mg | ORAL_TABLET | Freq: Every day | ORAL | Status: AC

## 2012-10-22 MED ORDER — BIOTENE DRY MOUTH MT LIQD: 15.0000 mL | Freq: Two times a day (BID) | OROMUCOSAL | Status: DC

## 2012-10-22 MED ORDER — FLUCONAZOLE 200 MG PO TABS: 200.0000 mg | ORAL_TABLET | Freq: Every day | ORAL | Status: DC

## 2012-10-22 MED ORDER — METOCLOPRAMIDE HCL 10 MG PO TABS: 10.0000 mg | ORAL_TABLET | Freq: Four times a day (QID) | ORAL | Status: DC

## 2012-10-22 MED ORDER — PHENOL 1.4 % MT LIQD: 1.0000 | OROMUCOSAL | Status: DC | PRN

## 2012-10-22 NOTE — Progress Notes (Signed)
   SUBJECTIVE:  No chest pain.  Still with cough but better   PHYSICAL EXAM Filed Vitals:   10/21/12 2005 10/21/12 2055 10/21/12 2347 10/22/12 0428  BP: 133/54   143/66  Pulse: 95  107 92  Temp: 98.9 F (37.2 C)   98.8 F (37.1 C)  TempSrc: Oral   Oral  Resp: 20  18 20   Height:      Weight:    266 lb 12.1 oz (121 kg)  SpO2: 100% 100% 99% 99%   General:  No distress Neck:  No JVD Lungs:  Clear, transmitted upper airway sounds. Heart:  RRR, positive S3 Abdomen:  Positive bowel sounds, no rebound no guarding Extremities:  Trace edema  LABS:  Results for orders placed during the hospital encounter of 10/07/12 (from the past 24 hour(s))  GLUCOSE, CAPILLARY     Status: Abnormal   Collection Time    10/21/12 11:04 AM      Result Value Range   Glucose-Capillary 158 (*) 70 - 99 mg/dL   Comment 1 Documented in Chart     Comment 2 Notify RN    GLUCOSE, CAPILLARY     Status: Abnormal   Collection Time    10/21/12  5:18 PM      Result Value Range   Glucose-Capillary 257 (*) 70 - 99 mg/dL   Comment 1 Documented in Chart     Comment 2 Notify RN    GLUCOSE, CAPILLARY     Status: Abnormal   Collection Time    10/21/12  8:59 PM      Result Value Range   Glucose-Capillary 248 (*) 70 - 99 mg/dL   Comment 1 Notify RN    BASIC METABOLIC PANEL     Status: Abnormal   Collection Time    10/22/12  6:35 AM      Result Value Range   Sodium 135  135 - 145 mEq/L   Potassium 5.0  3.5 - 5.1 mEq/L   Chloride 102  96 - 112 mEq/L   CO2 21  19 - 32 mEq/L   Glucose, Bld 291 (*) 70 - 99 mg/dL   BUN 56 (*) 6 - 23 mg/dL   Creatinine, Ser 1.19 (*) 0.50 - 1.10 mg/dL   Calcium 8.8  8.4 - 14.7 mg/dL   GFR calc non Af Amer 25 (*) >90 mL/min   GFR calc Af Amer 29 (*) >90 mL/min  GLUCOSE, CAPILLARY     Status: Abnormal   Collection Time    10/22/12  6:52 AM      Result Value Range   Glucose-Capillary 262 (*) 70 - 99 mg/dL   Comment 1 Notify RN     Comment 2 Documented in Chart       Intake/Output Summary (Last 24 hours) at 10/22/12 0830 Last data filed at 10/22/12 8295  Gross per 24 hour  Intake    720 ml  Output   2301 ml  Net  -1581 ml    ASSESSMENT AND PLAN:  Acute on chronic diastolic HF: Diuretics on hold. Creat is still mildly elevated but better today.  No change in therapy today.  AKI on CKD: See above  Elevated troponin:  Given CKD there are no plans for a cardiac cath.    Jasmine [name] 10/22/2012 8:30 AM

## 2012-10-22 NOTE — Discharge Summary (Signed)
Physician Discharge Summary  Jasmine [name] JXB:[id number] DOB: 1971/01/17 DOA: 10/07/2012  PCP: Dois Davenport., MD  Admit date: 10/07/2012 Discharge date: 10/22/2012  Time spent: 60 minutes  Recommendations for Outpatient Follow-up:  Please follow up with cardiology and your PCP at specified date.  An appointment will need to be made with gastroenterology ([facility]) within 2 weeks of discharge.  Follow a low sodium, heart healthy diet, with a fluid restriction of 1.2L/day.  Home health has also been set up.    Discharge Diagnoses:  Principal Problem:   Acute respiratory failure with hypoxia Active Problems:   DIABETES MELLITUS, TYPE I   DIABETIC  RETINOPATHY   Hypertension   OSA (obstructive sleep apnea)   Acute on chronic diastolic heart failure   Acute on chronic renal failure   CKD (chronic kidney disease), stage IV   Pneumonia due to unspecified Streptococcus   Abdominal pain   Gastroparesis   Discharge Condition: Stable  Diet recommendation: Cardiac, Heart healthy, 1.2L/day fluid restriction  Filed Weights   10/20/12 0409 10/21/12 0512 10/22/12 0428  Weight: 120.249 kg (265 lb 1.6 oz) 121.8 kg (268 lb 8.3 oz) 121 kg (266 lb 12.1 oz)    History of present illness:  Jasmine [name] is a 42 y.o. female with HFpEF, CKD, HTN, DM, OSA, who presents with sudden onset of SOB and chest pain this evening. She states that for the past week she has had a non-productive cough without fever, congestion, otherwise has been in her USOH with stable 2-pillow orthopnea, no increased DOE/SOB, and stable edema. She was taking a shower at ~6:30 this evening when she noted sudden onset of SOB. After her shower while sitting she experienced severe substernal chest pressure, radiated to her left arm. When EMS arrived her BP was 180s/90s per patient, and her chest pain improved significantly with SL NTG. She denies diaphoresis. Currently she is still having chest pain, but less severe and  different in quality, feels like a muscle strain and is tender to touch. Remains SOB with some difficulty holding a conversation.     Hospital Course:  This is a 42 year old female past medical history of diastolic heart failure knee of 55%, hypertension, CK-MB stage IV, diabetes mellitus type 2, diabetes, and diabetic gastroparesis that presented to the emergency room for 15 days ago for sudden onset of shortness of breath. Patient had also had substernal chest pressure at that time. She was admitted by cardiology. Patient was noted to have hypoxemia. On chest x-ray she was noted to have acute pulmonary edema. She did not tolerate BiPAP and at that time required endotracheal intubation.  Patient was then placed also on broad-spectrum antibiotics to cover for possible pneumonia.  Tracheal aspiration was conducted which showed moderate Streptococcus beta-hemolytic not group A strep.  Her blood cultures remained negative.  Patient remained intubated for approximately 5 days.  Patient remained on broad-spectrum antibiotics however in spite of this she continued to have leukocytosis. Patient was instructed on antifungal, fluconazole. She was continued on this until September 30.  Of note patient was also seen by nephrology due to 2 her CK D. stage IV. Patient was I'm overloaded however was improving. She did have good urine outputs.  After patient was on sent to the floor. Internal medicine was consult said for her abdominal pain with nausea and vomiting. Patient continued on Levaquin and Flagyl was added to her regimen. And after speaking to patient regarding diabetes, she has a history of diabetic  gastroparesis however never used Reglan.  Gastroenterology was contacted and recommended a gastric imaging study however this can be done as an outpatient. Patient was instructed on Casimiro Needle and. Her nausea and vomiting didn't subside. She will be discharged with Reglan prescription.  Unable discharge patient's  leukocytosis had improved her shortness of breath also resolved. She continued to have more upper airway congestion however this is thought to be secondary to intubation. Patient was given Tessalon Perles and Chloraseptic spray.  Patient was able to tolerate full diet again with no nausea or vomiting.    Procedures: Study Conclusions  - Left ventricle: Technically difficult study, with suboptimal endocardial visualization. Would recommend contrast enhancement for more accurate regional wall motion assessment if deemed necessary. The cavity size was normal. Wall thickness was increased in a pattern of mild LVH. Systolic function was normal. The estimated ejection fraction was in the range of 55% to 60%. Features are consistent with a pseudonormal left ventricular filling pattern, with concomitant abnormal relaxation and increased filling pressure (grade 2 diastolic dysfunction). Doppler parameters are consistent with elevated ventricular end-diastolic filling pressure. - Regional wall motion abnormality: Mild hypokinesis of the apical septal and apical myocardium. - Aortic valve: Mildly thickened leaflets. - Mitral valve: Calcified annulus. Mildly thickened leaflets . - Left atrium: The atrium was mildly dilated. - Inferior vena cava: The vessel was dilated; the respirophasic diameter changes were blunted (< 50%); findings are consistent with elevated central venous pressure.   Consultations: Cardiology, Nephrology, Pulmnology, Gastroenterology  Discharge Exam: Filed Vitals:   10/22/12 0428  BP: 143/66  Pulse: 92  Temp: 98.8 F (37.1 C)  Resp: 20   General: Awake Alert, Oriented X 3, Normal affect, Well developed  HEENT: [place]/AT,PERRAL  Neck: Supple Neck,No JVD, No cervical lymphadenopathy appriciated.  Respiratory: Symmetrical Chest wall movement, Good air movement bilaterally, CTAB  Upper airway wheezing noted  Cardiovascular: RRR, +S1/S2, No Gallops,Rubs or new Murmurs   Abdomen: Soft, obese, nontender, nondistended, positive bowel sounds, no rebound  Extremities: No edema. No Cyanosis, Clubbing.  Neuro: Grossly intact, nonfocal.  Discharge Instructions  Discharge Orders   Future Appointments Provider Department Dept Phone   10/25/2012 9:00 AM [facility] [phone]   11/24/2012 10:00 AM Oretha Milch, MD [facility] [phone]   Future Orders Complete By Expires   (HEART FAILURE PATIENTS) Call MD:  Anytime you have any of the following symptoms: 1) 3 pound weight gain in 24 hours or 5 pounds in 1 week 2) shortness of breath, with or without a dry hacking cough 3) swelling in the hands, feet or stomach 4) if you have to sleep on extra pillows at night in order to breathe.  As directed    Diet - low sodium heart healthy  As directed    Scheduling Instructions:     1.2L/day fluid restriction   Discharge instructions  As directed    Comments:     Please follow up with cardiology and your PCP at specified date.  An appointment will need to be made with gastroenterology ([facility]) within 2 weeks of discharge.  Follow a low sodium, heart healthy diet, with a fluid restriction of 1.2L/day.  Home health has also been set up.  Discuss with your cardiologist the need to continue Ramipril as well as labetalol.   Increase activity slowly  As directed        Medication List    STOP taking these medications  furosemide 80 MG tablet  Commonly known as:  LASIX      TAKE these medications       amLODipine 10 MG tablet  Commonly known as:  NORVASC  Take 1 tablet (10 mg total) by mouth daily.     antiseptic oral rinse Liqd  15 mLs by Mouth Rinse route 2 (two) times daily.     aspirin 81 MG tablet  Take 81 mg by mouth daily as needed. For pain     atorvastatin 40 MG tablet  Commonly known as:  LIPITOR  Take 1 tablet (40 mg total) by mouth daily at 6 PM.     benzonatate  100 MG capsule  Commonly known as:  TESSALON  Take 1 capsule (100 mg total) by mouth 2 (two) times daily as needed for cough.     ferrous sulfate 325 (65 FE) MG tablet  Take 325 mg by mouth 2 (two) times daily.     fluconazole 200 MG tablet  Commonly known as:  DIFLUCAN  Take 1 tablet (200 mg total) by mouth daily.     freestyle lancets     FREESTYLE LITE test strip  Generic drug:  glucose blood  Twice daily before meals.     hydrALAZINE 25 MG tablet  Commonly known as:  APRESOLINE  Take 1 tablet (25 mg total) by mouth 4 (four) times daily.     Hydrocodone-Acetaminophen 5-300 MG Tabs  Take 1 tablet by mouth 3 (three) times daily as needed. For pain     labetalol 300 MG tablet  Commonly known as:  NORMODYNE  Take 300 mg by mouth 2 (two) times daily.     levofloxacin 750 MG tablet  Commonly known as:  LEVAQUIN  Take 1 tablet (750 mg total) by mouth daily.     metoCLOPramide 10 MG tablet  Commonly known as:  REGLAN  Take 1 tablet (10 mg total) by mouth 4 (four) times daily.     NOVOLOG MIX 70/30 FLEXPEN (70-30) 100 UNIT/ML Supn  Generic drug:  Insulin Aspart Prot & Aspart  Inject 30-40 Units into the skin 2 (two) times daily with a meal. Take 40 units in the am & 30 units in the pm     pantoprazole 40 MG tablet  Commonly known as:  PROTONIX  Take 1 tablet (40 mg total) by mouth at bedtime.     phenol 1.4 % Liqd  Commonly known as:  CHLORASEPTIC  Use as directed 1 spray in the mouth or throat as needed.     ramipril 10 MG tablet  Commonly known as:  ALTACE  Take 10 mg by mouth daily.       Allergies  Allergen Reactions  . Contrast Media [Iodinated Diagnostic Agents] Nausea And Vomiting  . Paroxetine Nausea And Vomiting  . Oxycodone Itching and Rash       Follow-up Information   Follow up with Arvilla Meres, MD On 10/25/2012. (at 9:00 Garage code [id number])    Specialty:  Cardiology   Contact information:   [address] [place]  [address] [phone]       Follow up with [facility]. (Home health nurse and physical therapy)    Contact information:   [phone]      Follow up with Oretha Milch., MD On 11/24/2012. (at 10 :00)    Specialty:  Pulmonary Disease   Contact information:   [address] [place] [address] [phone]  The results of significant diagnostics from this hospitalization (including imaging, microbiology, ancillary and laboratory) are listed below for reference.    Significant Diagnostic Studies: Ct Abdomen Pelvis Wo Contrast  10/19/2012   CLINICAL DATA:  Left lower quadrant abdominal pain, nausea and vomiting. Elevated white blood cell count.  EXAM: CT ABDOMEN AND PELVIS WITHOUT CONTRAST  TECHNIQUE: Multidetector CT imaging of the abdomen and pelvis was performed following the standard protocol without intravenous contrast.  COMPARISON:  08/05/2009  FINDINGS: Unenhanced CT was performed per clinician order. Lack of IV contrast limits sensitivity and specificity, especially for evaluation of abdominal/pelvic solid viscera.  The lung bases are clear. Heart size is normal. No pericardial or pleural effusion.  Splenic granulomas are reidentified. Cholecystectomy clips are noted. Liver, adrenal glands, and pancreas are unremarkable. Punctate left renal probable vascular calcifications are noted. No hydronephrosis or evidence for significant radiopaque renal or ureteral calculi.  Lobulated uterine contour reidentified with IUD in place. The ovaries are relatively midline with respect to each other and inhomogeneously enlarged on the right with possible underlying approximately 5 cm lesion measuring 20 Hounsfield units. This most likely represents a hemorrhagic cyst. No pelvic free fluid. The appendix and bowel are unremarkable. Moderate atheromatous aortic calcification is noted. The bladder is normal.  Representative 7 mm retroperitoneal periaortic lymph node image 47 is stable.  No  acute osseous abnormality.  IMPRESSION: Lobulated uterine contour compatible with previously seen uterine fibroid.  The ovaries are relatively medial in location with probable right hemorrhagic ovarian cyst, although endometrioma can have a similar appearance and endometriosis can account for this apparent ovarian position ( "Kissing ovaries" ).  No acute intra-abdominal or pelvic pathology.   Electronically Signed   By: Christiana Pellant M.D.   On: 10/19/2012 21:07   Dg Chest 2 View  10/17/2012   CLINICAL DATA:  Chest pain, cough, congestion  EXAM: CHEST  2 VIEW  COMPARISON:  10/13/2012  FINDINGS: Cardiomediastinal silhouette is stable. No pulmonary edema. Mild elevation of the right hemidiaphragm again noted. Central mild bronchitic changes. Bony thorax is stable. No segmental infiltrate.  IMPRESSION: No segmental infiltrate or pulmonary edema. Central mild bronchitic changes.   Electronically Signed   By: Natasha Mead   On: 10/17/2012 13:46   Dg Chest 2 View  10/07/2012   CLINICAL DATA:  Chest pain. Shortness of breast.  EXAM: CHEST  2 VIEW  COMPARISON:  01/17/2012.  FINDINGS: Mild cardiopericardial enlargement. Perihilar vascular fullness with fissural thickening and probable Kerley lines in the lateral projection. There may be trace pleural effusions. No pneumothorax.  IMPRESSION: Cardiomegaly and pulmonary edema.   Electronically Signed   By: Tiburcio Pea   On: 10/07/2012 22:08   Dg Chest Port 1 View  10/13/2012   *RADIOLOGY REPORT*  Clinical Data: Evaluate congestive heart failure.  PORTABLE CHEST - 1 VIEW  Comparison: Chest radiograph 10/12/2012  Findings: Left-sided central venous catheter tip projects over the superior vena cava, unchanged.  Interval extubation.  Stable cardiac and mediastinal contours.  Slight interval improvement in diffuse bilateral interstitial pulmonary opacities.  No definite pleural effusion or pneumothorax.  IMPRESSION: Slight interval improvement in bilateral interstitial  opacities, most compatible with improving pulmonary edema.   Original Report Authenticated By: Annia Belt, M.D   Dg Chest Port 1 View  10/12/2012   CLINICAL DATA:  Pneumonia, CHF, followup  EXAM: PORTABLE CHEST - 1 VIEW  COMPARISON:  Portable chest x-ray of 10/11/2012  FINDINGS: There is no change in position of the  endotracheal tube and left central venous line. There may have been some slight worsening of the degree of moderate pulmonary vascular congestion with cardiomegaly present.  IMPRESSION: 1. Apparent worsening of mild CHF. 2. Stable position of endotracheal tube and left central venous line.   Electronically Signed   By: Dwyane Dee M.D.   On: 10/12/2012 08:19   Dg Chest Port 1 View  10/11/2012   *RADIOLOGY REPORT*  Clinical Data: Pulmonary edema, follow-up  PORTABLE CHEST - 1 VIEW  Comparison: Portable chest x-ray of 10/10/2012  Findings: There is little change in poor aeration with pulmonary edema.  Cardiomegaly is stable.  The endotracheal tube and left central venous line are unchanged in position.  IMPRESSION: No change in poor aeration and moderate pulmonary edema.   Original Report Authenticated By: Dwyane Dee, M.D.   Dg Chest Port 1 View  10/11/2012   CLINICAL DATA:  Check endotracheal tube.  EXAM: PORTABLE CHEST - 1 VIEW  COMPARISON:  CHEST x-ray from earlier the same day.  FINDINGS: Endotracheal tube ends in the mid thoracic trachea. Unchanged positioning of left IJ central venous catheter, tip near the upper SVC. Gastric suction tube crosses the diaphragm.  Unchanged cardiomegaly. Modest improvement in pulmonary edema, still mildly asymmetric. No interval pneumothorax or significant effusion accumulation.  IMPRESSION: 1. Stable positioning of tubes and lines. 2. Improving pulmonary edema.   Electronically Signed   By: Tiburcio Pea   On: 10/11/2012 05:55   Dg Chest Port 1 View  10/10/2012   CLINICAL DATA:  42 year old female with diastolic heart failure.  EXAM: PORTABLE CHEST - 1  VIEW  COMPARISON:  10/09/2012 and earlier.  FINDINGS: Portable AP semi upright view at 0609 hr. Stable endotracheal tube tip at the level the clavicles. Stable left IJ central line. Stable visible enteric tube.  Stable cardiac size and mediastinal contours. Increased retrocardiac opacity. Indistinctness of pulmonary vasculature and perihilar opacity continues. No large effusion. No pneumothorax.  IMPRESSION: 1. Stable lines and tubes.  2.  Increased left lower lobe collapse or consolidation.  3. Persistent evidence of pulmonary edema.   Electronically Signed   By: Augusto Gamble M.D.   On: 10/10/2012 08:22   Dg Chest Port 1 View  10/09/2012   *RADIOLOGY REPORT*  Clinical Data: Central line placement, ventilated  PORTABLE CHEST - 1 VIEW  Comparison: 10/09/2012  Findings: Left IJ central line tip terminates over the brachiocephalic/SVC junction.  No pneumothorax.  Endotracheal tube is appropriately positioned. Nasogastric tube terminates below the level of the diaphragms but the tip is not included on the film. Patchy pulmonary airspace opacities persist with mild cardiomegaly.  IMPRESSION: Left IJ central line tip over brachiocephalic/SVC junction.  No pneumothorax.   Original Report Authenticated By: Christiana Pellant, M.D.   Dg Chest Port 1 View  10/09/2012   CLINICAL DATA:  Respiratory failure  EXAM: PORTABLE CHEST - 1 VIEW  COMPARISON:  10/08/2012  FINDINGS: Endotracheal tube has its tip 4.5 cm above the Carina. Nasogastric tube enters the stomach. Bilateral airspace density consistent with pulmonary edema persists, but is improved since yesterday. No worsening or new findings. No effusions.  IMPRESSION: Improvement in pulmonary edema pattern.   Electronically Signed   By: Paulina Fusi M.D.   On: 10/09/2012 07:40   Dg Chest Port 1 View  10/08/2012   *RADIOLOGY REPORT*  Clinical Data: Intubation  PORTABLE CHEST - 1 VIEW  Comparison: Prior radiograph from 10/07/2012  Findings: Endotracheal tube is in place with  tip located  4.1 cm above the carina.  Enteric tube courses into the abdomen. Cardiomegaly is unchanged.  There has been interval worsening of diffuse pulmonary edema.  No pneumothorax.  No definite focal infiltrates are identified.  Osseous structures are unchanged.  IMPRESSION: 1.  Tip of endotracheal tube 4.1 cm above the carina. 2.  Interval worsening of pulmonary edema as compared to 10/07/2012.   Original Report Authenticated By: Rise Mu, M.D.    Microbiology: No results found for this or any previous visit (from the past 240 hour(s)).   Labs: Basic Metabolic Panel:  Recent Labs Lab 10/18/12 0930 10/19/12 0650 10/20/12 0500 10/21/12 0500 10/22/12 0635  NA 134* 134* 137 135 135  K 4.2 4.3 4.5 4.8 5.0  CL 97 97 100 100 102  CO2 22 21 21  18* 21  GLUCOSE 247* 278* 235* 210* 291*  BUN 91* 84* 73* 63* 56*  CREATININE 2.24* 2.37* 2.28* 2.38* 2.32*  CALCIUM 9.6 9.3 8.9 8.8 8.8   Liver Function Tests: No results found for this basename: AST, ALT, ALKPHOS, BILITOT, PROT, ALBUMIN,  in the last 168 hours No results found for this basename: LIPASE, AMYLASE,  in the last 168 hours No results found for this basename: AMMONIA,  in the last 168 hours CBC:  Recent Labs Lab 10/17/12 0600 10/18/12 1000 10/19/12 0650 10/20/12 0500 10/21/12 0500  WBC 14.8* 16.2* 15.8* 13.5* 12.3*  HGB 10.0* 10.2* 10.2* 10.6* 10.3*  HCT 30.4* 32.3* 33.0* 34.1* 32.3*  MCV 88.4 90.0 91.2 91.7 91.2  PLT 391 416* 415* 410* 412*   Cardiac Enzymes: No results found for this basename: CKTOTAL, CKMB, CKMBINDEX, TROPONINI,  in the last 168 hours BNP: BNP (last 3 results)  Recent Labs  01/17/12 1910 10/07/12 2126  PROBNP 1510.0* 1631.0*   CBG:  Recent Labs Lab 10/21/12 0618 10/21/12 1104 10/21/12 1718 10/21/12 2059 10/22/12 0652  GLUCAP 209* 158* 257* 248* 262*       Signed:  Edsel Petrin  [facility] 10/22/2012, 9:39 AM

## 2012-10-22 NOTE — Progress Notes (Signed)
10/22/2012  To Whom It May Concern:  Please excuse Jasmine [name].  She was admitted to [facility] on 10/07/2012 and discharged on 10/22/2012.   She may return to work on Oct 25, 2012.    If you have any questions or concerns, you may contact me at [phone].    Thank you for your time. Regards,     Edsel Petrin, D.O. [facility]

## 2012-10-25 ENCOUNTER — Encounter: Payer: Self-pay | Admitting: Gastroenterology

## 2012-10-25 ENCOUNTER — Ambulatory Visit (HOSPITAL_COMMUNITY)
Admit: 2012-10-25 | Discharge: 2012-10-25 | Disposition: A | Discharge status: Home / Self Care | Payer: 59 | Source: Ambulatory Visit | Attending: Internal Medicine | Admitting: Internal Medicine

## 2012-10-25 VITALS — BP 116/62 | HR 82 | Wt 272.8 lb

## 2012-10-25 DIAGNOSIS — G4733 Obstructive sleep apnea (adult) (pediatric): Secondary | ICD-10-CM

## 2012-10-25 DIAGNOSIS — N184 Chronic kidney disease, stage 4 (severe): Secondary | ICD-10-CM | POA: Insufficient documentation

## 2012-10-25 DIAGNOSIS — I5032 Chronic diastolic (congestive) heart failure: Secondary | ICD-10-CM

## 2012-10-25 DIAGNOSIS — K3184 Gastroparesis: Secondary | ICD-10-CM

## 2012-10-25 LAB — BASIC METABOLIC PANEL
BUN: 58 mg/dL — ABNORMAL HIGH (ref 6–23)
CO2: 19 mEq/L (ref 19–32)
Calcium: 8.8 mg/dL (ref 8.4–10.5)
Chloride: 101 mEq/L (ref 96–112)
Creatinine, Ser: 3.06 mg/dL — ABNORMAL HIGH (ref 0.50–1.10)
GFR calc Af Amer: 21 mL/min — ABNORMAL LOW (ref >90)
GFR calc non Af Amer: 18 mL/min — ABNORMAL LOW (ref >90)
Glucose, Bld: 153 mg/dL — ABNORMAL HIGH (ref 70–99)
Potassium: 4.8 mEq/L (ref 3.5–5.1)
Sodium: 135 mEq/L (ref 135–145)

## 2012-10-25 MED ORDER — BISOPROLOL-HYDROCHLOROTHIAZIDE 5-6.25 MG PO TABS: 1.0000 | ORAL_TABLET | Freq: Every day | ORAL | Status: DC

## 2012-10-25 MED ORDER — ALBUTEROL SULFATE HFA 108 (90 BASE) MCG/ACT IN AERS: 2.0000 | INHALATION_SPRAY | Freq: Four times a day (QID) | RESPIRATORY_TRACT | Status: AC | PRN

## 2012-10-25 MED ORDER — HYDRALAZINE HCL 25 MG PO TABS: 25.0000 mg | ORAL_TABLET | Freq: Three times a day (TID) | ORAL | Status: DC

## 2012-10-25 MED ORDER — FUROSEMIDE 80 MG PO TABS: 80.0000 mg | ORAL_TABLET | Freq: Two times a day (BID) | ORAL | Status: DC

## 2012-10-25 NOTE — Progress Notes (Signed)
Patient ID: Jasmine [name], female   DOB: Sep 11, 1970, 42 y.o.   MRN: [id number] PCP: Dr. Christy Gentles Nephrology: Dr Abel Presto Pulmonary:   HPI: Jasmine [name] is a 42 y.o African American female with history of nonobstructive CAD per cath 2010, HTN, uncontrolled DM and diastolic dysfunction. . Renal US evaluated no hydronephrosis.   - Echo on November 03, 2010, EF 55-60%, normal wall motion, without regional wall motion abnormalities. Mild LVH. Probable pseudonormal filling pressures. Trivial mitral regurgitation with mildly dilated left atrium. Echo on October 19, 2010 showed grade 1 diastolic dysfunction.  - V/Q scan on November 02, 2010, showing normal ventilation perfusion without evidence of PE.  - CT of the chest without contrast 10/2010 showing suspected multifocal alveolar edema in the bilateral upper lobes and superior left lower lobe. Multifocal infection considered less likely. Cardiomegaly with bilateral pleural effusions. No findings to suggest interstitial lung disease.  - 11/14 CPX Peak VO2: 10.53ml/kg/min % predicted peak VO2: 57.2% (corrects to 19.6 for ideal body weight), VE/VCO2 slope: 52 (to peak exercise) 43 (to RCP),           OUES: 1.08, Peak RER: 1.19, Ventilatory Threshold: 7.2 % predicted peak VO2: 37.6%, O2pulse: 12 % predicted O2pulse: 90% - Sleep study completed 10/31. Dr Vassie Loll evaluated severe sleep apnea, placed on CPAP. - Echo 01/18/12: EF 60%.  High filling pressures.  LA mildly to mod dilated.  RV mildly dilated.  RA mildly dilated  Admitted to [facility] through 10/22/12. Admitted with chest pain and VDRF due to PNA (beta hemolytic strep group B).  Abdominal discomfort due to possible hemorrhagic R ovarian cyst. Lasix stopped at discharge. She was instructed to resume ramipril and labetalol on discharge.  Discharged weight 266 pounds.  She returns for post hospital follow up. Remains SOB with very mild exertion. + Orthopnea. Weight at home trending up to 271 pounds. Not using CPAP.  Taking all medications.   ROS: All systems negative except as listed in HPI, PMH and Problem List.  Past Medical History  Diagnosis Date  . Diastolic heart failure     echo 11/03/10 EF 55-60%  . Hypertension   . Chronic kidney disease (CKD)     stage II-III  . Hyperlipidemia   . Uncontrolled diabetes mellitus   . Diabetic ketoacidosis     with seizures  . Anemia   . Diabetic gastroparesis   . Obesity   . Coronary artery disease     mild per cath 2010  . CKD (chronic kidney disease), stage IV 01/20/2012    Current Outpatient Prescriptions  Medication Sig Dispense Refill  . amLODipine (NORVASC) 10 MG tablet Take 1 tablet (10 mg total) by mouth daily.  30 tablet  6  . antiseptic oral rinse (BIOTENE) LIQD 15 mLs by Mouth Rinse route 2 (two) times daily.  1 Bottle  0  . aspirin 81 MG tablet Take 81 mg by mouth daily as needed. For pain      . atorvastatin (LIPITOR) 40 MG tablet Take 1 tablet (40 mg total) by mouth daily at 6 PM.  30 tablet  0  . ferrous sulfate 325 (65 FE) MG tablet Take 325 mg by mouth 2 (two) times daily.        Marland Kitchen FREESTYLE LITE test strip Twice daily before meals.      . hydrALAZINE (APRESOLINE) 25 MG tablet Take 1 tablet (25 mg total) by mouth 4 (four) times daily.  120 tablet  0  . Hydrocodone-Acetaminophen 5-300 MG TABS  Take 1 tablet by mouth 3 (three) times daily as needed. For pain      . Insulin Aspart Prot & Aspart (NOVOLOG MIX 70/30 FLEXPEN) (70-30) 100 UNIT/ML SUPN Inject 30-40 Units into the skin 2 (two) times daily with a meal. Take 40 units in the am & 30 units in the pm      . labetalol (NORMODYNE) 300 MG tablet Take 300 mg by mouth 2 (two) times daily.        . Lancets (FREESTYLE) lancets       . metoCLOPramide (REGLAN) 10 MG tablet Take 1 tablet (10 mg total) by mouth 4 (four) times daily.  120 tablet  0  . phenol (CHLORASEPTIC) 1.4 % LIQD Use as directed 1 spray in the mouth or throat as needed.  1 Bottle  0  . ramipril (ALTACE) 10 MG tablet Take 10  mg by mouth daily.       . benzonatate (TESSALON) 100 MG capsule Take 1 capsule (100 mg total) by mouth 2 (two) times daily as needed for cough.  20 capsule  0  . pantoprazole (PROTONIX) 40 MG tablet Take 1 tablet (40 mg total) by mouth at bedtime.  30 tablet  0   No current facility-administered medications for this encounter.     PHYSICAL EXAM: Filed Vitals:   10/25/12 0913  BP: 116/62  Pulse: 82  Weight: 272 lb 12.8 oz (123.741 kg)  SpO2: 100%    General:  Well appearing. No resp difficulty HEENT: normal Neck: supple. JVP 5-6 Carotids 2+ bilaterally; no bruits. No lymphadenopathy or thryomegaly appreciated. Cor: PMI normal. Regular rate & rhythm. No rubs, gallops or murmurs. Lungs: clear Abdomen: obese, soft, nontender, nondistended. No hepatosplenomegaly. No bruits or masses. Good bowel sounds. Extremities: no cyanosis, clubbing, rash, trace edema Neuro: alert & orientedx3, cranial nerves grossly intact. Moves all 4 extremities w/o difficulty. Affect pleasa   ASSESSMENT & PLAN:  1. Chronic diastolic Heart Failure- Discharge weight 10/22/12 was 266 pounds Volume status trending up about 5 pounds since  discharge. (not placed on diuretics at discharge)  Add lasix 80 mg twice a day until weight is < 270.  Then can switch back to 80 daily. Reinforced daily weights. Provided with weight chart.   2. DM Follow up PCP, DR Ricter   3 HTN would like to keep BP 130-140. Stop ramipril due to CKD Stop labetalol. Bisoprolol-HCT 5-6.25 mg daily Decerease  hydralazine to  25 mg tid.   4. OSA Intolerant CPAP mask. Hopefully she can change to different mask. Follow up with pulmonary next week. Will need PFTs.   5 Asthma Add albuterol. Follow up with pulmonary next week  6. CKD -  No ace inhibitor due to CKD. Followed by Dr Arrie Aran. She is scheduling follow up.   7. Gastroparesis F/U with GI 11/03/12  Follow up next week.   CLEGG,AMY  9:51 AM  Patient seen and examined  with Tonye Becket, NP. We discussed all aspects of the encounter. I agree with the assessment and plan as stated above. Doing fairly well post d/c. Volume trending back up. Will restart lasix. Reinforced need for daily weights and reviewed use of sliding scale diuretics as above. I suspect she has significant underlying lung disease and will need PFTs. Has f/u with Dr. Vassie Loll for OSA.   Truman Hayward 3:53 PM

## 2012-10-25 NOTE — Patient Instructions (Addendum)
Follow up 1 week  Take lasix 80 mg twice a day for weight 270  Or greater . Once weight is less than 270 take lasix 80 mg daily.   Stop ramipril  Stop labetalol  Take Bisoprolol-HCTZ 5-6.25 mg daily  Hydralazine 25 mg three times a day  You have been referred to Dr Sherene Sires (pulmonology) Mon 10/6 at 10:30  You have been referred to Dr Christella Hartigan (GI) on 10/10 at 2:30  Do the following things EVERYDAY: 1) Weigh yourself in the morning before breakfast. Write it down and keep it in a log. 2) Take your medicines as prescribed 3) Eat low salt foods-Limit salt (sodium) to 2000 mg per day.  4) Stay as active as you can everyday 5) Limit all fluids for the day to less than 2 liters

## 2012-10-30 ENCOUNTER — Ambulatory Visit (INDEPENDENT_AMBULATORY_CARE_PROVIDER_SITE_OTHER)
Admission: RE | Admit: 2012-10-30 | Discharge: 2012-10-30 | Disposition: A | Discharge status: Home / Self Care | Payer: 59 | Source: Ambulatory Visit | Attending: Internal Medicine | Admitting: Internal Medicine

## 2012-10-30 ENCOUNTER — Encounter: Payer: Self-pay | Admitting: Internal Medicine

## 2012-10-30 ENCOUNTER — Ambulatory Visit (INDEPENDENT_AMBULATORY_CARE_PROVIDER_SITE_OTHER): Payer: 59 | Admitting: Internal Medicine

## 2012-10-30 VITALS — BP 132/66 | HR 70 | Temp 98.7°F | Ht 66.0 in | Wt 272.0 lb

## 2012-10-30 DIAGNOSIS — R05 Cough: Secondary | ICD-10-CM

## 2012-10-30 DIAGNOSIS — G4733 Obstructive sleep apnea (adult) (pediatric): Secondary | ICD-10-CM

## 2012-10-30 MED ORDER — METHYLPREDNISOLONE ACETATE 80 MG/ML IJ SUSP
120.0000 mg | Freq: Once | INTRAMUSCULAR | Status: AC
  Administered 2012-10-30: 120 mg via INTRAMUSCULAR

## 2012-10-30 MED ORDER — BENZONATATE 100 MG PO CAPS: ORAL_CAPSULE | ORAL | Status: DC

## 2012-10-30 MED ORDER — HYDROCODONE-ACETAMINOPHEN 5-300 MG PO TABS: 1.0000 | ORAL_TABLET | Freq: Three times a day (TID) | ORAL | Status: DC | PRN

## 2012-10-30 NOTE — Progress Notes (Signed)
Subjective:     Patient ID: Jasmine [name], female   DOB: [birth year]  MRN: [id number]  HPI   45 yobf never smoker with doe attributed to diast chf then admitted / intubated with cp/ acute pulmonary edema:  Admit date: 10/07/2012  Discharge date: 10/22/2012  Time spent: 60 minutes  Recommendations for Outpatient Follow-up:  Please follow up with cardiology and your PCP at specified date. An appointment will need to be made with gastroenterology ([facility]) within 2 weeks of discharge. Follow a low sodium, heart healthy diet, with a fluid restriction of 1.2L/day. Home health has also been set up.  Discharge Diagnoses:  Principal Problem:  Acute respiratory failure with hypoxia  Active Problems:  DIABETES MELLITUS, TYPE I  DIABETIC RETINOPATHY  Hypertension  OSA (obstructive sleep apnea)  Acute on chronic diastolic heart failure  Acute on chronic renal failure  CKD (chronic kidney disease), stage IV  Pneumonia due to unspecified Streptococcus  Abdominal pain  Gastroparesis    10/30/2012 1st [facility] office visit/ Jasmine [name]/ consultation requested by Dr Jasmine [name]  cc ever since et pulled out cough incessant worse when immediately lying down > clear mucus. ACEi d/c 10/25/12 due to renal insufficiency but  Cough not better yet    No obvious day to day or daytime variabilty or assoc sob (improved) or cp or chest tightness, subjective wheeze overt sinus or hb symptoms. No unusual exp hx or h/o childhood pna/ asthma or knowledge of premature birth.  Sleeping ok without nocturnal  or early am exacerbation  of respiratory  c/o's or need for noct saba. Also denies any obvious fluctuation of symptoms with weather or environmental changes or other aggravating or alleviating factors except as outlined above   Current Medications, Allergies, Complete Past Medical History, Past Surgical History, Family History, and Social History were reviewed in Jasmine [name]  record.  ROS  The following are not active complaints unless bolded sore throat, dysphagia, dental problems, itching, sneezing,  nasal congestion or excess/ purulent secretions, ear ache,   fever, chills, sweats, unintended wt loss, pleuritic or exertional cp, hemoptysis,  orthopnea pnd or leg swelling, presyncope, palpitations, heartburn, abdominal pain, anorexia, nausea, vomiting, diarrhea  or change in bowel or urinary habits, change in stools or urine, dysuria,hematuria,  rash, arthralgias, visual complaints, headache, numbness weakness or ataxia or problems with walking or coordination,  change in mood/affect or memory.         Review of Systems     Objective:   Physical Exam    very hoarse amb bf nad  Wt Readings from Last 3 Encounters:  10/30/12 272 lb (123.378 kg)  10/25/12 272 lb 12.8 oz (123.741 kg)  10/22/12 266 lb 12.1 oz (121 kg)      HEENT: nl dentition, turbinates, and orophanx. Nl external ear canals without cough reflex   NECK :  without JVD/Nodes/TM/ nl carotid upstrokes bilaterally   LUNGS: no acc muscle use, clear to A and P bilaterally without cough on insp or exp maneuvers   CV:  RRR  no s3 or murmur or increase in P2, no edema   ABD:  soft and nontender with nl excursion in the supine position. No bruits or organomegaly, bowel sounds nl  MS:  warm without deformities, calf tenderness, cyanosis or clubbing  SKIN: warm and dry without lesions    NEURO:  alert, approp, no deficits    CXR  10/30/2012 : 1. No radiographic evidence of acute cardiopulmonary disease. 2.  Mild cardiomegaly.    Assessment:

## 2012-10-30 NOTE — Patient Instructions (Addendum)
Please see patient coordinator before you leave today  to schedule new mask through your DME   Pantoprazole (protonix) 40 mg   Take 30-60 min before first meal of the day and Pepcid 20 mg one bedtime until return to office  For cough try tessalon and if not working at the vicodin (pain pill) as needed  GERD (REFLUX)  is an extremely common cause of respiratory symptoms, many times with no significant heartburn at all.    It can be treated with medication, but also with lifestyle changes including avoidance of late meals, excessive alcohol, smoking cessation, and avoid fatty foods, chocolate, peppermint, colas, red wine, and acidic juices such as orange juice.  NO MINT OR MENTHOL PRODUCTS SO NO COUGH DROPS  USE SUGARLESS CANDY INSTEAD (jolley ranchers or Stover's)  NO OIL BASED VITAMINS - use powdered substitutes.    Please remember to go to the x-ray department downstairs for your tests - we will call you with the results when they are available.  Please schedule a follow up office visit in 4 weeks, sooner if needed to see Vassie Loll and in meantime if cough no better need ent evaluation

## 2012-10-30 NOTE — Assessment & Plan Note (Signed)
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   These are the same pts (now being labeled as having "irritable larynx syndrome" by some cough centers) who not infrequently have a history of having failed to tolerate ace inhibitors ( which may have been the case here but note stopped due to cri 5 days ago so still on board),  dry powder inhalers or biphosphonates or report having atypical reflux symptoms that don't respond to standard doses of PPI , and are easily confused as having aecopd or asthma flares by even experienced allergists/ pulmonologists.   For now rx with max gerd rx then ent eval if not improving    Each maintenance medication was reviewed in detail including most importantly the difference between maintenance and as needed and under what circumstances the prns are to be used.  Please see instructions for details which were reviewed in writing and the patient given a copy.

## 2012-10-30 NOTE — Assessment & Plan Note (Signed)
Referred back to Dr Vassie Loll, will try to provide new mask in meantime

## 2012-10-31 ENCOUNTER — Other Ambulatory Visit (HOSPITAL_COMMUNITY): Payer: Self-pay | Admitting: Cardiology

## 2012-10-31 DIAGNOSIS — I5022 Chronic systolic (congestive) heart failure: Secondary | ICD-10-CM

## 2012-10-31 MED ORDER — HYDRALAZINE HCL 25 MG PO TABS: 25.0000 mg | ORAL_TABLET | Freq: Three times a day (TID) | ORAL | Status: DC

## 2012-10-31 NOTE — Telephone Encounter (Signed)
Requested Prescriptions   Signed Prescriptions Disp Refills  . hydrALAZINE (APRESOLINE) 25 MG tablet 90 tablet 6    Sig: Take 1 tablet (25 mg total) by mouth 3 (three) times daily.    Authorizing Provider: Dolores Patty    Ordering User: Cecilie Heidel, Milagros Reap

## 2012-11-01 ENCOUNTER — Ambulatory Visit (HOSPITAL_COMMUNITY)
Admission: RE | Admit: 2012-11-01 | Discharge: 2012-11-01 | Disposition: A | Discharge status: Home / Self Care | Payer: 59 | Source: Ambulatory Visit | Attending: Cardiology | Admitting: Cardiology

## 2012-11-01 ENCOUNTER — Encounter (HOSPITAL_COMMUNITY): Payer: Self-pay | Admitting: *Deleted

## 2012-11-01 VITALS — BP 145/68 | HR 78 | Wt 271.8 lb

## 2012-11-01 DIAGNOSIS — I5032 Chronic diastolic (congestive) heart failure: Secondary | ICD-10-CM

## 2012-11-01 DIAGNOSIS — I129 Hypertensive chronic kidney disease with stage 1 through stage 4 chronic kidney disease, or unspecified chronic kidney disease: Secondary | ICD-10-CM | POA: Insufficient documentation

## 2012-11-01 DIAGNOSIS — N183 Chronic kidney disease, stage 3 unspecified: Secondary | ICD-10-CM | POA: Insufficient documentation

## 2012-11-01 DIAGNOSIS — G4733 Obstructive sleep apnea (adult) (pediatric): Secondary | ICD-10-CM

## 2012-11-01 DIAGNOSIS — R0609 Other forms of dyspnea: Secondary | ICD-10-CM

## 2012-11-01 DIAGNOSIS — R0600 Dyspnea, unspecified: Secondary | ICD-10-CM

## 2012-11-01 DIAGNOSIS — I10 Essential (primary) hypertension: Secondary | ICD-10-CM

## 2012-11-01 DIAGNOSIS — R0989 Other specified symptoms and signs involving the circulatory and respiratory systems: Secondary | ICD-10-CM

## 2012-11-01 DIAGNOSIS — I5022 Chronic systolic (congestive) heart failure: Secondary | ICD-10-CM | POA: Insufficient documentation

## 2012-11-01 LAB — BASIC METABOLIC PANEL
BUN: 71 mg/dL — ABNORMAL HIGH (ref 6–23)
CO2: 20 mEq/L (ref 19–32)
Calcium: 9.4 mg/dL (ref 8.4–10.5)
Chloride: 101 mEq/L (ref 96–112)
Creatinine, Ser: 1.81 mg/dL — ABNORMAL HIGH (ref 0.50–1.10)
GFR calc non Af Amer: 33 mL/min — ABNORMAL LOW (ref >90)
Glucose, Bld: 175 mg/dL — ABNORMAL HIGH (ref 70–99)
Sodium: 134 mEq/L — ABNORMAL LOW (ref 135–145)

## 2012-11-01 LAB — PRO B NATRIURETIC PEPTIDE: Pro B Natriuretic peptide (BNP): 2441 pg/mL — ABNORMAL HIGH (ref 0–125)

## 2012-11-01 NOTE — Patient Instructions (Signed)
Keep lasix 80 mg twice a day unless weight at home less than 268 lbs.  Will call with lab results.  F/u 4-6 weeks  Do the following things EVERYDAY: 1) Weigh yourself in the morning before breakfast. Write it down and keep it in a log. 2) Take your medicines as prescribed 3) Eat low salt foods-Limit salt (sodium) to 2000 mg per day.  4) Stay as active as you can everyday 5) Limit all fluids for the day to less than 2 liters

## 2012-11-01 NOTE — Progress Notes (Addendum)
Patient ID: Treasa L Tison, female   DOB: 09-03-1970, 42 y.o.   MRN: [id number] PCP: Dr. Christy Gentles Nephrology: Dr Abel Presto Pulmonary:   HPI: Ms. Ruvalcaba is a 42 y.o African American female with history of nonobstructive CAD per cath 2010, HTN, uncontrolled DM and diastolic dysfunction. . Renal US evaluated no hydronephrosis.   - Echo on November 03, 2010, EF 55-60%, normal wall motion, without regional wall motion abnormalities. Mild LVH. Probable pseudonormal filling pressures. Trivial mitral regurgitation with mildly dilated left atrium. Echo on October 19, 2010 showed grade 1 diastolic dysfunction.  - V/Q scan on November 02, 2010, showing normal ventilation perfusion without evidence of PE.  - CT of the chest without contrast 10/2010 showing suspected multifocal alveolar edema in the bilateral upper lobes and superior left lower lobe. Multifocal infection considered less likely. Cardiomegaly with bilateral pleural effusions. No findings to suggest interstitial lung disease.  - 11/2010 CPX Peak VO2: 10.35ml/kg/min % predicted peak VO2: 57.2% (corrects to 19.6 for ideal body weight), VE/VCO2 slope: 52 (to peak exercise) 43 (to RCP), OUES: 1.08, Peak RER: 1.19, Ventilatory Threshold: 7.2 % predicted peak VO2: 37.6%, O2pulse: 12 % predicted O2pulse: 90% - Sleep study completed 11/25/2010. Dr Vassie Loll evaluated severe sleep apnea, placed on CPAP. - Echo 01/18/12: EF 60%.  High filling pressures.  LA mildly to mod dilated.  RV mildly dilated.  RA mildly dilated  Admitted to [facility] through 10/22/12. Admitted with chest pain and VDRF due to PNA (beta hemolytic strep group B).  Abdominal discomfort due to possible hemorrhagic R ovarian cyst. Lasix stopped at discharge. She was instructed to resume ramipril and labetalol on discharge.  Discharged weight 266 pounds.  Follow up: Last visit stopped ramapril and labetalol and started bisoprolol-HCZ 5-6.25mg . Saw Dr. Sherene Sires Monday and he started protonix 40 mg daily and pepcid  20 mg daily. Feels good. Denies SOB, orthopnea (sleeps on 2 pillows for comfort), or CP. + edema and cough. Not wearing CPAP currently, but is in the process of getting another mask. Taking all medications and following low salt diet, Drinking less than 2 L day. Weight at home 269-271 lbs. DOE with walking upstairs.   ECHOs     (09/2012): EF 55-60% with grade II diastolic dysfx  Labs     10/25/12: K+ 4.8, Cr 3.06 (ramipril stopped) ROS: All systems negative except as listed in HPI, PMH and Problem List.  Past Medical History  Diagnosis Date  . Diastolic heart failure     echo 11/03/10 EF 55-60%  . Hypertension   . Chronic kidney disease (CKD)     stage II-III  . Hyperlipidemia   . Uncontrolled diabetes mellitus   . Diabetic ketoacidosis     with seizures  . Anemia   . Diabetic gastroparesis   . Obesity   . Coronary artery disease     mild per cath 2010  . CKD (chronic kidney disease), stage IV 01/20/2012    Current Outpatient Prescriptions  Medication Sig Dispense Refill  . albuterol (PROVENTIL HFA;VENTOLIN HFA) 108 (90 BASE) MCG/ACT inhaler Inhale 2 puffs into the lungs every 6 (six) hours as needed for wheezing.  1 Inhaler  2  . amLODipine (NORVASC) 10 MG tablet Take 1 tablet (10 mg total) by mouth daily.  30 tablet  6  . aspirin 81 MG tablet Take 81 mg by mouth daily as needed. For pain      . atorvastatin (LIPITOR) 40 MG tablet Take 1 tablet (40 mg total) by mouth daily  at 6 PM.  30 tablet  0  . benzonatate (TESSALON) 100 MG capsule Take one every 4 hours as needed for cough  40 capsule  2  . bisoprolol-hydrochlorothiazide (ZIAC) 5-6.25 MG per tablet Take 1 tablet by mouth daily.  30 tablet  3  . famotidine (PEPCID) 20 MG tablet Take 20 mg by mouth at bedtime.      . ferrous sulfate 325 (65 FE) MG tablet Take 325 mg by mouth 2 (two) times daily.        Marland Kitchen FREESTYLE LITE test strip Twice daily before meals.      . furosemide (LASIX) 80 MG tablet Take 1 tablet (80 mg total) by  mouth 2 (two) times daily.  60 tablet  6  . hydrALAZINE (APRESOLINE) 25 MG tablet Take 1 tablet (25 mg total) by mouth 3 (three) times daily.  90 tablet  6  . Hydrocodone-Acetaminophen 5-300 MG TABS Take 1 tablet by mouth 3 (three) times daily as needed. For pain  112 each  0  . Insulin Aspart Prot & Aspart (NOVOLOG MIX 70/30 FLEXPEN) (70-30) 100 UNIT/ML SUPN Inject 30-40 Units into the skin 2 (two) times daily with a meal. Take 40 units in the am & 30 units in the pm      . Lancets (FREESTYLE) lancets       . metoCLOPramide (REGLAN) 10 MG tablet Take 1 tablet (10 mg total) by mouth 4 (four) times daily.  120 tablet  0  . pantoprazole (PROTONIX) 40 MG tablet Take 1 tablet (40 mg total) by mouth at bedtime.  30 tablet  0   No current facility-administered medications for this encounter.     PHYSICAL EXAM: Filed Vitals:   11/01/12 1357  BP: 145/68  Pulse: 78  Weight: 271 lb 12 oz (123.265 kg)  SpO2: 100%   General:  Well appearing. No resp difficulty HEENT: normal Neck: supple. JVP 7-8 Carotids 2+ bilaterally; no bruits. No lymphadenopathy or thryomegaly appreciated. Cor: PMI normal. Regular rate & rhythm. No rubs, gallops or murmurs. Lungs: clear Abdomen: obese, soft, nontender, nondistended. No hepatosplenomegaly. No bruits or masses. Good bowel sounds. Extremities: no cyanosis, clubbing, rash, trace edema Neuro: alert & orientedx3, cranial nerves grossly intact. Moves all 4 extremities w/o difficulty. Affect pleasa   ASSESSMENT & PLAN:  1. Chronic diastolic Heart Failure, EF 55-60% grade II diastolic dysfx (09/2012) - Her symptoms are much improved since last week and has NYHA II symptoms. Volume status is stable and does not appear to be volume overloaded. Instructed patient to continue to take lasix 80 mg BID. She does continue to have DOE will get pro-BNP today. - BP controlled will continue norvasc, bisoprolol-HCTZ and hydralazine for afterload reduction. - Reinforced the need  and importance of daily weights, a low sodium diet, and fluid restriction (less than 2 L a day). Instructed to call the HF clinic if weight increases more than 3 lbs overnight or 5 lbs in a week.  2. HTN  - Stable continue medications as above.  3. OSA - Continue to follow up with Pulmonology. Once new mask fitted recommended to the patient to wear CPAP nightly and discussed the benefits. 4. CKD, stage III - Followed by Dr Arrie Aran. Will get BMET today.  F/U 4-6 weeks  Aundria Rud 11:12 PM

## 2012-11-01 NOTE — Progress Notes (Signed)
Quick Note:  lmomtcb for pt ______ 

## 2012-11-03 ENCOUNTER — Ambulatory Visit: Payer: 59 | Admitting: Gastroenterology

## 2012-11-09 ENCOUNTER — Telehealth: Payer: Self-pay | Admitting: Internal Medicine

## 2012-11-09 NOTE — Telephone Encounter (Signed)
Notes Recorded by Christen Butter, CMA on 11/07/2012 at 3:46 PM lmtcb ------  Notes Recorded by Raford Pitcher, RN on 11/01/2012 at 3:41 PM lmomtcb for pt ------  Notes Recorded by Christen Butter, CMA on 10/30/2012 at 3:50 PM LMTCB ------  Notes Recorded by Nyoka Cowden, MD on 10/30/2012 at 2:43 PM Call pt: Reviewed cxr and no acute change so no change in recommendations made at ov  Pt advised. Carron Curie, CMA

## 2012-11-15 ENCOUNTER — Other Ambulatory Visit (HOSPITAL_COMMUNITY): Payer: Self-pay | Admitting: *Deleted

## 2012-11-15 MED ORDER — BISOPROLOL-HYDROCHLOROTHIAZIDE 5-6.25 MG PO TABS: 1.0000 | ORAL_TABLET | Freq: Every day | ORAL | Status: DC

## 2012-11-15 MED ORDER — FUROSEMIDE 80 MG PO TABS: 80.0000 mg | ORAL_TABLET | Freq: Two times a day (BID) | ORAL | Status: DC

## 2012-11-20 ENCOUNTER — Other Ambulatory Visit (HOSPITAL_COMMUNITY): Payer: Self-pay | Admitting: *Deleted

## 2012-11-20 DIAGNOSIS — I5022 Chronic systolic (congestive) heart failure: Secondary | ICD-10-CM

## 2012-11-20 MED ORDER — ATORVASTATIN CALCIUM 40 MG PO TABS: 40.0000 mg | ORAL_TABLET | Freq: Every day | ORAL | Status: DC

## 2012-11-20 MED ORDER — AMLODIPINE BESYLATE 10 MG PO TABS: 10.0000 mg | ORAL_TABLET | Freq: Every day | ORAL | Status: DC

## 2012-11-20 MED ORDER — HYDRALAZINE HCL 25 MG PO TABS: 25.0000 mg | ORAL_TABLET | Freq: Three times a day (TID) | ORAL | Status: DC

## 2012-11-24 ENCOUNTER — Inpatient Hospital Stay: Payer: 59 | Admitting: Pulmonary Disease

## 2012-11-29 ENCOUNTER — Ambulatory Visit: Payer: 59 | Admitting: Adult Health

## 2012-12-04 ENCOUNTER — Encounter: Payer: Self-pay | Admitting: Adult Health

## 2012-12-06 ENCOUNTER — Ambulatory Visit (HOSPITAL_COMMUNITY)
Admission: RE | Admit: 2012-12-06 | Discharge: 2012-12-06 | Disposition: A | Discharge status: Home / Self Care | Payer: 59 | Source: Ambulatory Visit | Attending: Internal Medicine | Admitting: Internal Medicine

## 2012-12-06 VITALS — BP 138/74 | HR 65 | Wt 262.0 lb

## 2012-12-06 DIAGNOSIS — E119 Type 2 diabetes mellitus without complications: Secondary | ICD-10-CM | POA: Insufficient documentation

## 2012-12-06 DIAGNOSIS — I5032 Chronic diastolic (congestive) heart failure: Secondary | ICD-10-CM

## 2012-12-06 DIAGNOSIS — I129 Hypertensive chronic kidney disease with stage 1 through stage 4 chronic kidney disease, or unspecified chronic kidney disease: Secondary | ICD-10-CM | POA: Insufficient documentation

## 2012-12-06 DIAGNOSIS — I5030 Unspecified diastolic (congestive) heart failure: Secondary | ICD-10-CM | POA: Insufficient documentation

## 2012-12-06 DIAGNOSIS — N183 Chronic kidney disease, stage 3 unspecified: Secondary | ICD-10-CM | POA: Insufficient documentation

## 2012-12-06 DIAGNOSIS — I2510 Atherosclerotic heart disease of native coronary artery without angina pectoris: Secondary | ICD-10-CM | POA: Insufficient documentation

## 2012-12-06 DIAGNOSIS — E785 Hyperlipidemia, unspecified: Secondary | ICD-10-CM | POA: Insufficient documentation

## 2012-12-06 DIAGNOSIS — I10 Essential (primary) hypertension: Secondary | ICD-10-CM

## 2012-12-06 LAB — BASIC METABOLIC PANEL
CO2: 22 mEq/L (ref 19–32)
Calcium: 9.1 mg/dL (ref 8.4–10.5)
Chloride: 100 mEq/L (ref 96–112)
Creatinine, Ser: 1.75 mg/dL — ABNORMAL HIGH (ref 0.50–1.10)
Glucose, Bld: 262 mg/dL — ABNORMAL HIGH (ref 70–99)
Sodium: 135 mEq/L (ref 135–145)

## 2012-12-06 NOTE — Addendum Note (Signed)
Encounter addended by: Noralee Space, RN on: 12/06/2012 11:19 AM<BR>     Documentation filed: Patient Instructions Section, Orders

## 2012-12-06 NOTE — Patient Instructions (Signed)
Labs today  We will contact you in 3 months to schedule your next appointment.  

## 2012-12-06 NOTE — Progress Notes (Signed)
Patient ID: Jasmine [name], female   DOB: 05-07-1970, 42 y.o.   MRN: [id number] PCP: Dr. Christy Gentles Nephrology: Dr Abel Presto Pulmonary:   HPI: Jasmine [name] is a 42 y.o African American female with history of morbid obesity, onobstructive CAD per cath 2010, HTN, uncontrolled DM, class III CKD and diastolic HF.     - Echo on November 03, 2010, EF 55-60%, normal wall motion, without regional wall motion abnormalities. Mild LVH. Probable pseudonormal filling pressures. Trivial mitral regurgitation with mildly dilated left atrium. Echo on October 19, 2010 showed grade 1 diastolic dysfunction.  - V/Q scan on November 02, 2010, showing normal ventilation perfusion without evidence of PE.  - CT of the chest without contrast 10/2010 showing suspected multifocal alveolar edema in the bilateral upper lobes and superior left lower lobe. Multifocal infection considered less likely. Cardiomegaly with bilateral pleural effusions. No findings to suggest interstitial lung disease.  - 11/2010 CPX Peak VO2: 10.41ml/kg/min % predicted peak VO2: 57.2% (corrects to 19.6 for ideal body weight), VE/VCO2 slope: 52 (to peak exercise) 43 (to RCP), OUES: 1.08, Peak RER: 1.19, Ventilatory Threshold: 7.2 % predicted peak VO2: 37.6%, O2pulse: 12 % predicted O2pulse: 90% - Sleep study completed 11/25/2010. Dr Vassie Loll evaluated severe sleep apnea, placed on CPAP. ECHO (09/2012): EF 55-60% with grade II diastolic dysfx  Admitted to [facility] in 42/14 with VDRF due to PNA (beta hemolytic strep group B) and renal failure.  Abdominal discomfort due to possible hemorrhagic R ovarian cyst.Discharged weight 266 pounds.  Follow-up: At last visit still had URI symptoms. Was treated and referred to Pulmonary. Saw Dr. Sherene Sires. Started on pepcid and protonix. Taking lasix 80 bid. Doing well with that. Weighing daily. Weight typically 260 range but going down. If cuts lasix back to once a day - weight goes back up. No orthopnea, PND or edema. No wheezing. Feeling much  better. Back to work at [employer].   Labs     10/25/12: K+ 4.8, Cr 3.06 (ramipril stopped)     11/01/12: K+ 5.0  Cr 1.8 (Baseline 2.3)  ROS: All systems negative except as listed in HPI, PMH and Problem List.  Past Medical History  Diagnosis Date  . Diastolic heart failure     echo 11/03/10 EF 55-60%  . Hypertension   . Chronic kidney disease (CKD)     stage II-III  . Hyperlipidemia   . Uncontrolled diabetes mellitus   . Diabetic ketoacidosis     with seizures  . Anemia   . Diabetic gastroparesis   . Obesity   . Coronary artery disease     mild per cath 2010  . CKD (chronic kidney disease), stage IV 01/20/2012    Current Outpatient Prescriptions  Medication Sig Dispense Refill  . albuterol (PROVENTIL HFA;VENTOLIN HFA) 108 (90 BASE) MCG/ACT inhaler Inhale 2 puffs into the lungs every 6 (six) hours as needed for wheezing.  1 Inhaler  2  . amLODipine (NORVASC) 10 MG tablet Take 1 tablet (10 mg total) by mouth daily.  90 tablet  3  . aspirin 81 MG tablet Take 81 mg by mouth daily as needed. For pain      . atorvastatin (LIPITOR) 40 MG tablet Take 1 tablet (40 mg total) by mouth daily at 6 PM.  90 tablet  3  . bisoprolol-hydrochlorothiazide (ZIAC) 5-6.25 MG per tablet Take 1 tablet by mouth daily.  90 tablet  3  . famotidine (PEPCID) 20 MG tablet Take 20 mg by mouth at bedtime.      Marland Kitchen  ferrous sulfate 325 (65 FE) MG tablet Take 325 mg by mouth 2 (two) times daily.        Marland Kitchen FREESTYLE LITE test strip Twice daily before meals.      . furosemide (LASIX) 80 MG tablet Take 1 tablet (80 mg total) by mouth 2 (two) times daily.  180 tablet  3  . hydrALAZINE (APRESOLINE) 25 MG tablet Take 1 tablet (25 mg total) by mouth 3 (three) times daily.  270 tablet  3  . Hydrocodone-Acetaminophen 5-300 MG TABS Take 1 tablet by mouth 3 (three) times daily as needed. For pain  112 each  0  . Insulin Aspart Prot & Aspart (NOVOLOG MIX 70/30 FLEXPEN) (70-30) 100 UNIT/ML SUPN Inject 30-40 Units into the skin 2 (two)  times daily with a meal. Take 40 units in the am & 30 units in the pm      . Lancets (FREESTYLE) lancets       . metoCLOPramide (REGLAN) 10 MG tablet Take 1 tablet (10 mg total) by mouth 4 (four) times daily.  120 tablet  0  . pantoprazole (PROTONIX) 40 MG tablet Take 1 tablet (40 mg total) by mouth at bedtime.  30 tablet  0   No current facility-administered medications for this encounter.     PHYSICAL EXAM: Filed Vitals:   12/06/12 1039  BP: 138/74  Pulse: 65  Weight: 262 lb (118.842 kg)  SpO2: 100%   General:  Well appearing. No resp difficulty HEENT: normal Neck: supple. JVP 5-6 Carotids 2+ bilaterally; no bruits. No lymphadenopathy or thryomegaly appreciated. Cor: PMI normal. Regular rate & rhythm. No rubs, gallops or murmurs. Lungs: clear Abdomen: obese, soft, nontender, nondistended. No hepatosplenomegaly. No bruits or masses. Good bowel sounds. Extremities: no cyanosis, clubbing, rash, no edema Neuro: alert & orientedx3, cranial nerves grossly intact. Moves all 4 extremities w/o difficulty. Affect pleasa   ASSESSMENT & PLAN:  1. Chronic diastolic Heart Failure, EF 55-60% grade II diastolic dysfx (09/2012) - Her symptoms are much improved NYHA II . Volume status is stable and does not appear to be volume overloaded. IContinue to take lasix 80 mg BID. - BP controlled will continue norvasc, bisoprolol-HCTZ and hydralazine for afterload reduction. Not candidate for ACE-I due to CKD - Reinforced the need and importance of daily weights, a low sodium diet, and fluid restriction (less than 2 L a day). Instructed to call the HF clinic if weight increases more than 3 lbs overnight or 5 lbs in a week.  2. HTN  - Stable continue medications as above. Can increase hydralazine as needed to keep SBP < 140 3. OSA - Continue to follow up with Pulmonology. Once new mask fitted recommended to the patient to wear CPAP nightly and discussed the benefits. 4. CKD, stage III - Followed by Dr  Arrie Aran. Will get BMET today.  F/U 3 months  Yurem Viner 11:04 AM

## 2013-01-01 ENCOUNTER — Telehealth (HOSPITAL_COMMUNITY): Payer: Self-pay | Admitting: Cardiology

## 2013-01-01 MED ORDER — METOLAZONE 2.5 MG PO TABS: 2.5000 mg | ORAL_TABLET | ORAL | Status: DC

## 2013-01-01 NOTE — Telephone Encounter (Signed)
Spoke w/pt, wt up about 6 lbs since the weekend, she reports feet are very swollen and painful to walk on, she has been taking lasix 80 BID and denies excess fluid or salt intake, per Tonye Becket, NP give metolazone 2.5 mg today and tomorrow pt aware, rx sent in, if pt unable to afford she will call us back, if wt not improving she will let us know

## 2013-01-01 NOTE — Telephone Encounter (Signed)
Pt left voicemail with concerns of 6 lb weight gain (268 lb) x 2 days and LE edema DENIES: SoB, chest pains, dizziness, or cough  Please advise

## 2013-01-25 HISTORY — PX: INCISION AND DRAINAGE ABSCESS ANAL: SUR669

## 2013-02-13 ENCOUNTER — Telehealth (HOSPITAL_COMMUNITY): Payer: Self-pay | Admitting: *Deleted

## 2013-02-13 MED ORDER — METOLAZONE 2.5 MG PO TABS: 2.5000 mg | ORAL_TABLET | ORAL | Status: DC

## 2013-02-13 NOTE — Telephone Encounter (Signed)
Pt called c/o increased SOB and edema, she states she has not been able to weigh herself for about a week because the battery in her scale died.  Pt states she took an extra 80 mg of torsemide last night and it helped a little, she states she was able to lay down and sleep, she has had metolazone in the past but is out of it now, new rx sent in, if does not help or feels worse she will call back

## 2013-03-02 IMAGING — CT CT HEAD W/O CM
1 of 2 series · 13 of 30 positions shown, 17 images · non-contrast
Comparison: 10/08/2008

CLINICAL DATA: Possible seizure

CT HEAD WITHOUT CONTRAST
TECHNIQUE: Contiguous axial images were obtained from the base of
the skull through the vertex without contrast.

[Series 2: brain · axial · 0.47mm/px · z∈[+140,+273]mm · 13 of 32 slices shown, 17 images]
[im 3/32  brain]
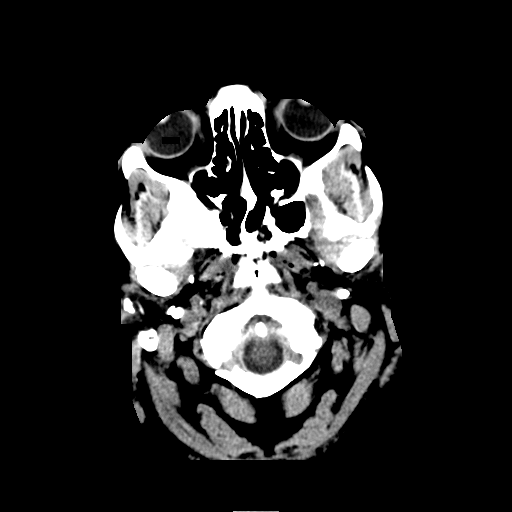
[im 3/32  bone]
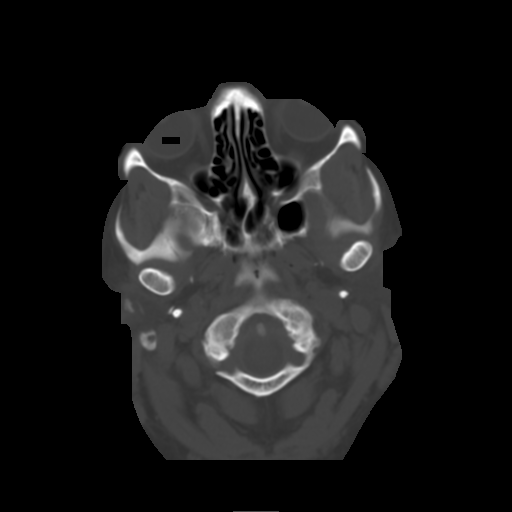
[im 5/32  brain]
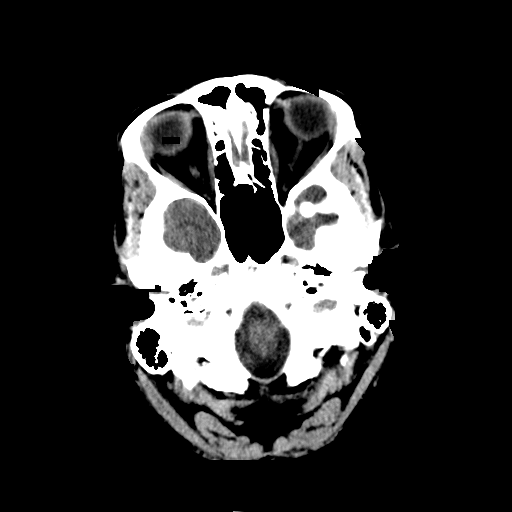
[im 7/32  brain]
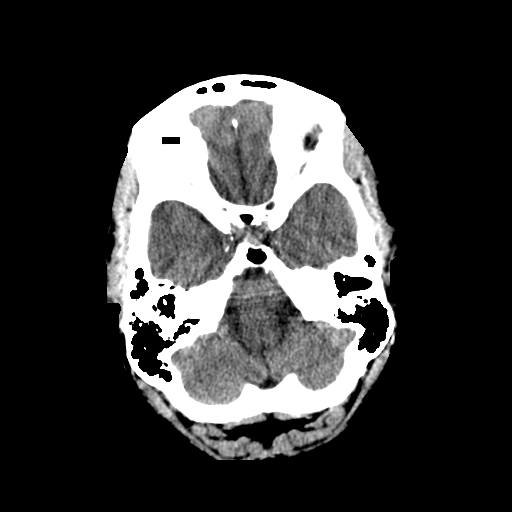
[im 9/32  brain]
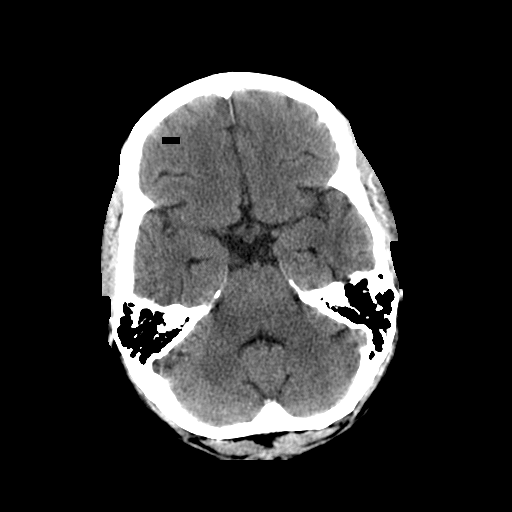
[im 12/32  brain]
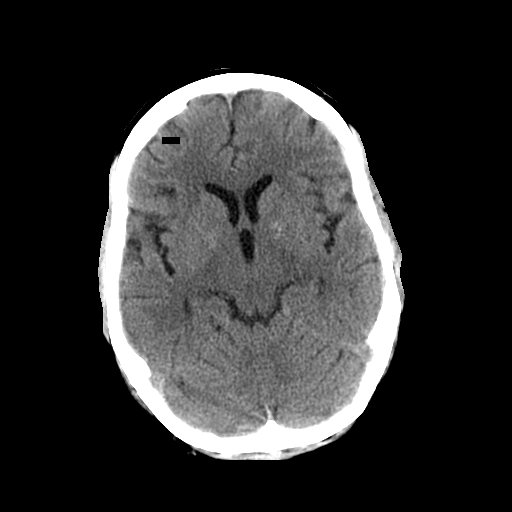
[im 12/32  bone]
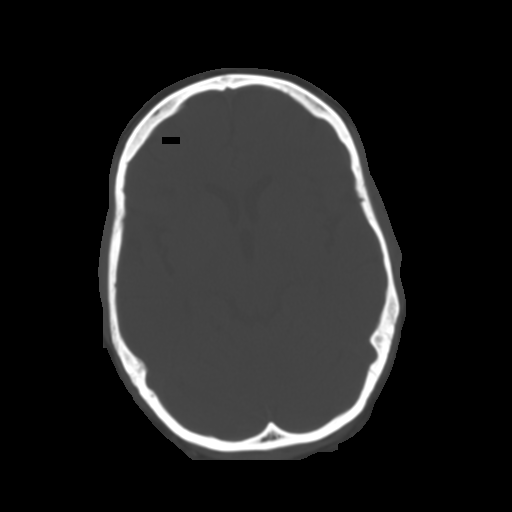
[im 14/32  brain]
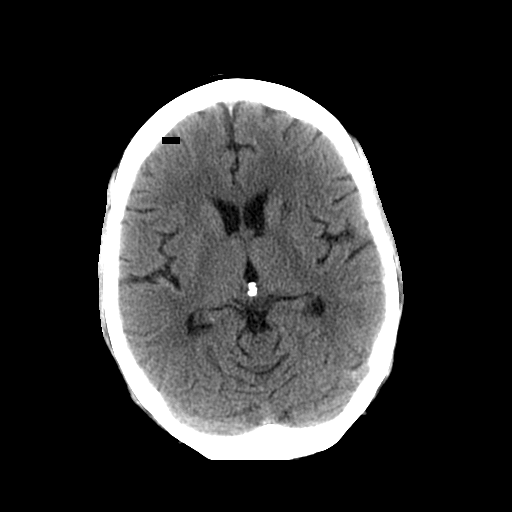
[im 16/32  brain]
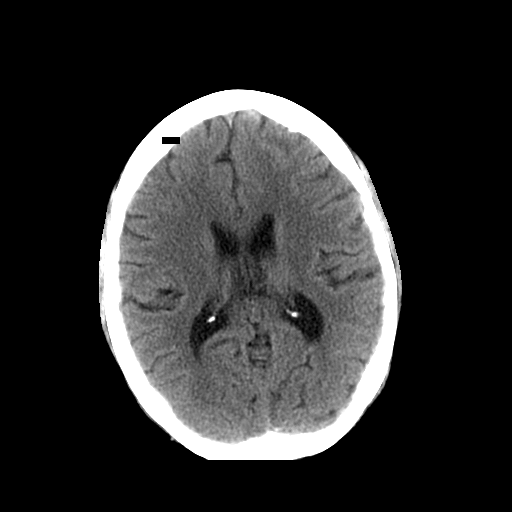
[im 18/32  brain]
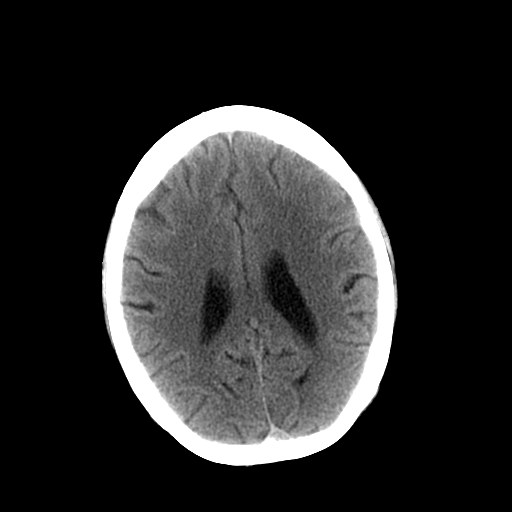
[im 20/32  brain]
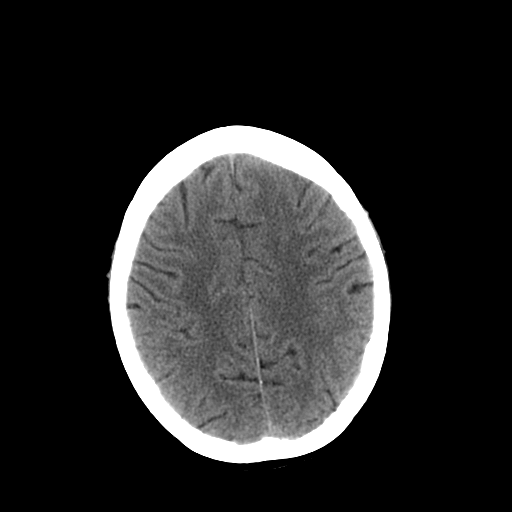
[im 20/32  bone]
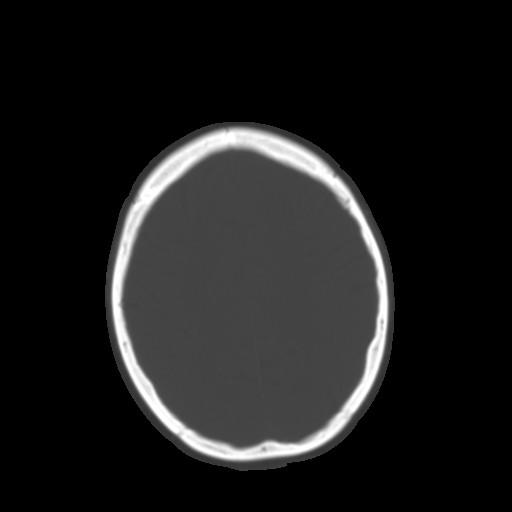
[im 23/32  brain]
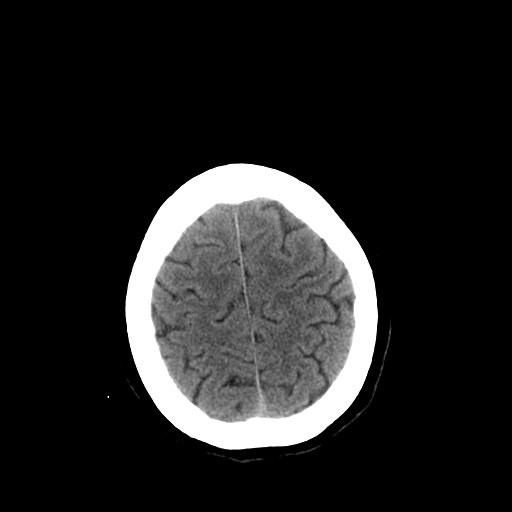
[im 25/32  brain]
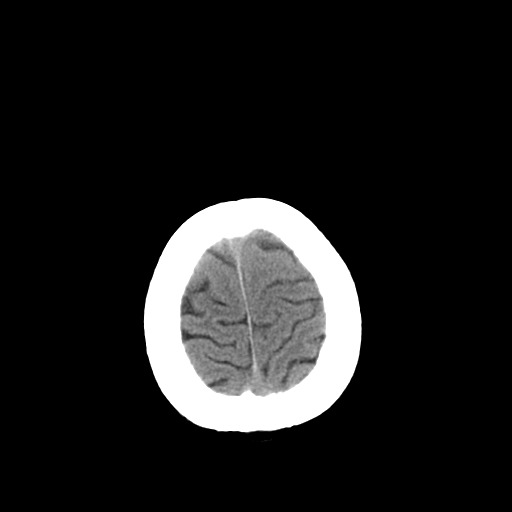
[im 27/32  brain]
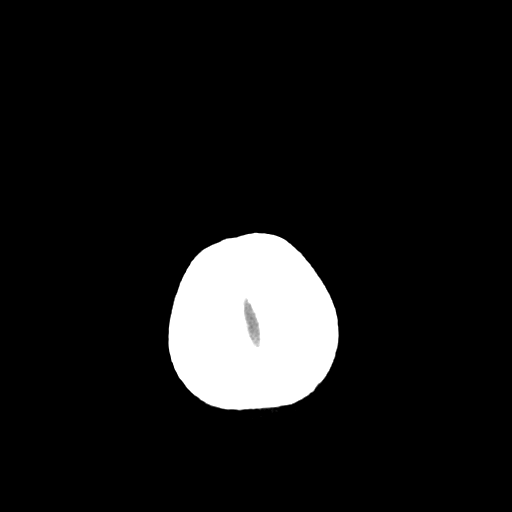
[im 29/32  brain]
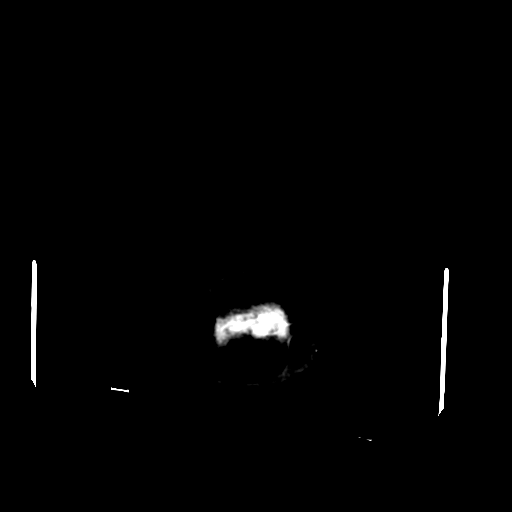
[im 29/32  bone]
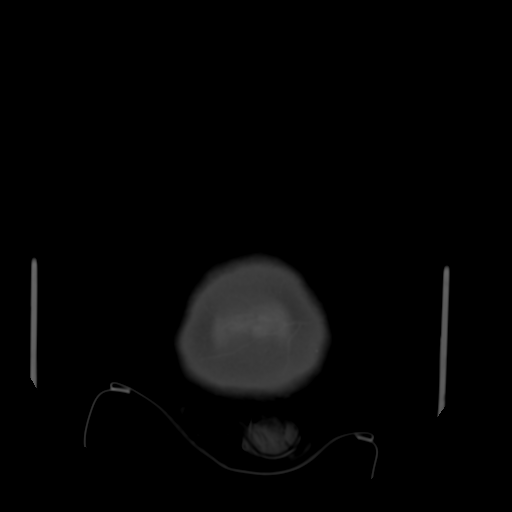

[13 of 30 positions shown; findings below may reference images not displayed]

FINDINGS: Early basal ganglia mineralization.  Ill-defined sub
centimeter low attenuation focus in the anterior limb left internal
capsule.  Negative for acute intracranial hemorrhage, midline
shift, mass, or mass effect.  Ventricles and sulci are symmetric.
There is mild atrophy.  Bone windows show no no calvarial lesion.
Acute infarct may be inapparent on noncontrast CT.
IMPRESSION: 1.  Negative for bleed or other acute intracranial process.
2.  Possible small lacunar infarct in the anterior limb left
internal capsule, which was not evident on the prior study.

## 2013-03-23 ENCOUNTER — Encounter (HOSPITAL_COMMUNITY): Payer: Self-pay | Admitting: Emergency Medicine

## 2013-03-23 ENCOUNTER — Inpatient Hospital Stay (HOSPITAL_COMMUNITY)
Admission: EM | Admit: 2013-03-23 | Discharge: 2013-04-13 | DRG: 853 | Disposition: A | Discharge status: Home / Self Care | Payer: 59 | Attending: Family Medicine | Admitting: Family Medicine

## 2013-03-23 DIAGNOSIS — E1139 Type 2 diabetes mellitus with other diabetic ophthalmic complication: Secondary | ICD-10-CM

## 2013-03-23 DIAGNOSIS — I5033 Acute on chronic diastolic (congestive) heart failure: Secondary | ICD-10-CM | POA: Diagnosis present

## 2013-03-23 DIAGNOSIS — F329 Major depressive disorder, single episode, unspecified: Secondary | ICD-10-CM | POA: Diagnosis present

## 2013-03-23 DIAGNOSIS — N184 Chronic kidney disease, stage 4 (severe): Secondary | ICD-10-CM

## 2013-03-23 DIAGNOSIS — E860 Dehydration: Secondary | ICD-10-CM | POA: Diagnosis present

## 2013-03-23 DIAGNOSIS — R000 Tachycardia, unspecified: Secondary | ICD-10-CM | POA: Diagnosis not present

## 2013-03-23 DIAGNOSIS — J189 Pneumonia, unspecified organism: Secondary | ICD-10-CM

## 2013-03-23 DIAGNOSIS — N179 Acute kidney failure, unspecified: Secondary | ICD-10-CM | POA: Diagnosis present

## 2013-03-23 DIAGNOSIS — R059 Cough, unspecified: Secondary | ICD-10-CM

## 2013-03-23 DIAGNOSIS — F3289 Other specified depressive episodes: Secondary | ICD-10-CM

## 2013-03-23 DIAGNOSIS — L0231 Cutaneous abscess of buttock: Secondary | ICD-10-CM

## 2013-03-23 DIAGNOSIS — R197 Diarrhea, unspecified: Secondary | ICD-10-CM | POA: Diagnosis not present

## 2013-03-23 DIAGNOSIS — A419 Sepsis, unspecified organism: Principal | ICD-10-CM

## 2013-03-23 DIAGNOSIS — I2510 Atherosclerotic heart disease of native coronary artery without angina pectoris: Secondary | ICD-10-CM | POA: Diagnosis present

## 2013-03-23 DIAGNOSIS — IMO0001 Reserved for inherently not codable concepts without codable children: Secondary | ICD-10-CM | POA: Diagnosis present

## 2013-03-23 DIAGNOSIS — I120 Hypertensive chronic kidney disease with stage 5 chronic kidney disease or end stage renal disease: Secondary | ICD-10-CM | POA: Diagnosis present

## 2013-03-23 DIAGNOSIS — G4700 Insomnia, unspecified: Secondary | ICD-10-CM | POA: Diagnosis present

## 2013-03-23 DIAGNOSIS — I509 Heart failure, unspecified: Secondary | ICD-10-CM | POA: Diagnosis present

## 2013-03-23 DIAGNOSIS — E1110 Type 2 diabetes mellitus with ketoacidosis without coma: Secondary | ICD-10-CM | POA: Diagnosis present

## 2013-03-23 DIAGNOSIS — L0390 Cellulitis, unspecified: Secondary | ICD-10-CM

## 2013-03-23 DIAGNOSIS — E1065 Type 1 diabetes mellitus with hyperglycemia: Secondary | ICD-10-CM | POA: Diagnosis present

## 2013-03-23 DIAGNOSIS — Z992 Dependence on renal dialysis: Secondary | ICD-10-CM

## 2013-03-23 DIAGNOSIS — Z7982 Long term (current) use of aspirin: Secondary | ICD-10-CM

## 2013-03-23 DIAGNOSIS — D72829 Elevated white blood cell count, unspecified: Secondary | ICD-10-CM

## 2013-03-23 DIAGNOSIS — K611 Rectal abscess: Secondary | ICD-10-CM

## 2013-03-23 DIAGNOSIS — J9601 Acute respiratory failure with hypoxia: Secondary | ICD-10-CM

## 2013-03-23 DIAGNOSIS — J309 Allergic rhinitis, unspecified: Secondary | ICD-10-CM

## 2013-03-23 DIAGNOSIS — N189 Chronic kidney disease, unspecified: Secondary | ICD-10-CM

## 2013-03-23 DIAGNOSIS — E1010 Type 1 diabetes mellitus with ketoacidosis without coma: Secondary | ICD-10-CM | POA: Diagnosis present

## 2013-03-23 DIAGNOSIS — F32A Depression, unspecified: Secondary | ICD-10-CM | POA: Diagnosis present

## 2013-03-23 DIAGNOSIS — L8993 Pressure ulcer of unspecified site, stage 3: Secondary | ICD-10-CM | POA: Diagnosis present

## 2013-03-23 DIAGNOSIS — N186 End stage renal disease: Secondary | ICD-10-CM | POA: Diagnosis present

## 2013-03-23 DIAGNOSIS — K612 Anorectal abscess: Secondary | ICD-10-CM | POA: Diagnosis present

## 2013-03-23 DIAGNOSIS — I10 Essential (primary) hypertension: Secondary | ICD-10-CM

## 2013-03-23 DIAGNOSIS — L89309 Pressure ulcer of unspecified buttock, unspecified stage: Secondary | ICD-10-CM | POA: Diagnosis present

## 2013-03-23 DIAGNOSIS — K3184 Gastroparesis: Secondary | ICD-10-CM

## 2013-03-23 DIAGNOSIS — R05 Cough: Secondary | ICD-10-CM

## 2013-03-23 DIAGNOSIS — R109 Unspecified abdominal pain: Secondary | ICD-10-CM

## 2013-03-23 DIAGNOSIS — L03317 Cellulitis of buttock: Secondary | ICD-10-CM

## 2013-03-23 DIAGNOSIS — I5032 Chronic diastolic (congestive) heart failure: Secondary | ICD-10-CM

## 2013-03-23 DIAGNOSIS — E669 Obesity, unspecified: Secondary | ICD-10-CM | POA: Diagnosis present

## 2013-03-23 DIAGNOSIS — R064 Hyperventilation: Secondary | ICD-10-CM | POA: Diagnosis not present

## 2013-03-23 DIAGNOSIS — D638 Anemia in other chronic diseases classified elsewhere: Secondary | ICD-10-CM

## 2013-03-23 DIAGNOSIS — E876 Hypokalemia: Secondary | ICD-10-CM | POA: Diagnosis not present

## 2013-03-23 DIAGNOSIS — E1049 Type 1 diabetes mellitus with other diabetic neurological complication: Secondary | ICD-10-CM | POA: Diagnosis present

## 2013-03-23 DIAGNOSIS — G5600 Carpal tunnel syndrome, unspecified upper limb: Secondary | ICD-10-CM

## 2013-03-23 DIAGNOSIS — F4310 Post-traumatic stress disorder, unspecified: Secondary | ICD-10-CM | POA: Diagnosis present

## 2013-03-23 DIAGNOSIS — Z79899 Other long term (current) drug therapy: Secondary | ICD-10-CM

## 2013-03-23 DIAGNOSIS — G4733 Obstructive sleep apnea (adult) (pediatric): Secondary | ICD-10-CM

## 2013-03-23 DIAGNOSIS — Z6841 Body Mass Index (BMI) 40.0 and over, adult: Secondary | ICD-10-CM

## 2013-03-23 DIAGNOSIS — E109 Type 1 diabetes mellitus without complications: Secondary | ICD-10-CM

## 2013-03-23 DIAGNOSIS — L0291 Cutaneous abscess, unspecified: Secondary | ICD-10-CM

## 2013-03-23 DIAGNOSIS — R061 Stridor: Secondary | ICD-10-CM | POA: Diagnosis not present

## 2013-03-23 DIAGNOSIS — M79604 Pain in right leg: Secondary | ICD-10-CM

## 2013-03-23 DIAGNOSIS — E785 Hyperlipidemia, unspecified: Secondary | ICD-10-CM | POA: Diagnosis present

## 2013-03-23 DIAGNOSIS — M79605 Pain in left leg: Secondary | ICD-10-CM

## 2013-03-23 DIAGNOSIS — J9600 Acute respiratory failure, unspecified whether with hypoxia or hypercapnia: Secondary | ICD-10-CM

## 2013-03-23 DIAGNOSIS — Z794 Long term (current) use of insulin: Secondary | ICD-10-CM

## 2013-03-23 DIAGNOSIS — J154 Pneumonia due to other streptococci: Secondary | ICD-10-CM | POA: Diagnosis present

## 2013-03-23 DIAGNOSIS — E1165 Type 2 diabetes mellitus with hyperglycemia: Secondary | ICD-10-CM

## 2013-03-23 DIAGNOSIS — R8271 Bacteriuria: Secondary | ICD-10-CM

## 2013-03-23 LAB — BASIC METABOLIC PANEL
BUN: 85 mg/dL — AB (ref 6–23)
BUN: 82 mg/dL — ABNORMAL HIGH (ref 6–23)
BUN: 77 mg/dL — ABNORMAL HIGH (ref 6–23)
CALCIUM: 8.7 mg/dL (ref 8.4–10.5)
CALCIUM: 8.7 mg/dL (ref 8.4–10.5)
CO2: 18 mEq/L — ABNORMAL LOW (ref 19–32)
CO2: 18 meq/L — AB (ref 19–32)
CO2: 18 mEq/L — ABNORMAL LOW (ref 19–32)
CREATININE: 2.35 mg/dL — AB (ref 0.50–1.10)
Calcium: 8.7 mg/dL (ref 8.4–10.5)
Chloride: 93 mEq/L — ABNORMAL LOW (ref 96–112)
Chloride: 99 mEq/L (ref 96–112)
Chloride: 101 mEq/L (ref 96–112)
Creatinine, Ser: 2.26 mg/dL — ABNORMAL HIGH (ref 0.50–1.10)
Creatinine, Ser: 2.35 mg/dL — ABNORMAL HIGH (ref 0.50–1.10)
GFR calc Af Amer: 30 mL/min — ABNORMAL LOW (ref >90)
GFR calc Af Amer: 28 mL/min — ABNORMAL LOW (ref >90)
GFR calc non Af Amer: 24 mL/min — ABNORMAL LOW (ref >90)
GFR, EST AFRICAN AMERICAN: 28 mL/min — AB (ref >90)
GFR, EST NON AFRICAN AMERICAN: 24 mL/min — AB (ref >90)
GFR, EST NON AFRICAN AMERICAN: 26 mL/min — AB (ref >90)
GLUCOSE: 127 mg/dL — AB (ref 70–99)
Glucose, Bld: 446 mg/dL — ABNORMAL HIGH (ref 70–99)
Glucose, Bld: 219 mg/dL — ABNORMAL HIGH (ref 70–99)
Potassium: 4 mEq/L (ref 3.7–5.3)
Potassium: 3.6 mEq/L — ABNORMAL LOW (ref 3.7–5.3)
Potassium: 4.1 mEq/L (ref 3.7–5.3)
SODIUM: 135 meq/L — AB (ref 137–147)
Sodium: 130 mEq/L — ABNORMAL LOW (ref 137–147)
Sodium: 134 mEq/L — ABNORMAL LOW (ref 137–147)

## 2013-03-23 LAB — URINALYSIS, ROUTINE W REFLEX MICROSCOPIC
BILIRUBIN URINE: NEGATIVE
Glucose, UA: NEGATIVE mg/dL
HGB URINE DIPSTICK: NEGATIVE
KETONES UR: NEGATIVE mg/dL
Nitrite: NEGATIVE
PH: 7 (ref 5.0–8.0)
Protein, ur: NEGATIVE mg/dL
SPECIFIC GRAVITY, URINE: 1.008 (ref 1.005–1.030)
Urobilinogen, UA: 0.2 mg/dL (ref 0.0–1.0)

## 2013-03-23 LAB — CBC WITH DIFFERENTIAL/PLATELET
BASOS PCT: 0 % (ref 0–1)
Basophils Absolute: 0 10*3/uL (ref 0.0–0.1)
EOS ABS: 0 10*3/uL (ref 0.0–0.7)
EOS PCT: 0 % (ref 0–5)
HEMATOCRIT: 25.3 % — AB (ref 36.0–46.0)
HEMOGLOBIN: 8.5 g/dL — AB (ref 12.0–15.0)
LYMPHS ABS: 1.1 10*3/uL (ref 0.7–4.0)
Lymphocytes Relative: 5 % — ABNORMAL LOW (ref 12–46)
MCH: 29.3 pg (ref 26.0–34.0)
MCHC: 33.6 g/dL (ref 30.0–36.0)
MCV: 87.2 fL (ref 78.0–100.0)
MONO ABS: 1.4 10*3/uL — AB (ref 0.1–1.0)
MONOS PCT: 6 % (ref 3–12)
Neutro Abs: 19.3 10*3/uL — ABNORMAL HIGH (ref 1.7–7.7)
Neutrophils Relative %: 89 % — ABNORMAL HIGH (ref 43–77)
Platelets: 241 10*3/uL (ref 150–400)
RBC: 2.9 MIL/uL — AB (ref 3.87–5.11)
RDW: 13.5 % (ref 11.5–15.5)
WBC: 21.8 10*3/uL — ABNORMAL HIGH (ref 4.0–10.5)

## 2013-03-23 LAB — BLOOD GAS, VENOUS
Acid-base deficit: 6.1 mmol/L — ABNORMAL HIGH (ref 0.0–2.0)
Bicarbonate: 17.6 mEq/L — ABNORMAL LOW (ref 20.0–24.0)
O2 SAT: 90.9 %
PATIENT TEMPERATURE: 98.6
PO2 VEN: 58.9 mmHg — AB (ref 30.0–45.0)
TCO2: 16.7 mmol/L (ref 0–100)
pCO2, Ven: 29.8 mmHg — ABNORMAL LOW (ref 45.0–50.0)
pH, Ven: 7.389 — ABNORMAL HIGH (ref 7.250–7.300)

## 2013-03-23 LAB — CBG MONITORING, ED
GLUCOSE-CAPILLARY: 286 mg/dL — AB (ref 70–99)
Glucose-Capillary: 446 mg/dL — ABNORMAL HIGH (ref 70–99)
Glucose-Capillary: 313 mg/dL — ABNORMAL HIGH (ref 70–99)
Glucose-Capillary: 251 mg/dL — ABNORMAL HIGH (ref 70–99)

## 2013-03-23 LAB — URINE MICROSCOPIC-ADD ON

## 2013-03-23 LAB — CBC
HCT: 25.2 % — ABNORMAL LOW (ref 36.0–46.0)
Hemoglobin: 8.4 g/dL — ABNORMAL LOW (ref 12.0–15.0)
MCH: 28.9 pg (ref 26.0–34.0)
MCHC: 33.3 g/dL (ref 30.0–36.0)
MCV: 86.6 fL (ref 78.0–100.0)
PLATELETS: 223 10*3/uL (ref 150–400)
RBC: 2.91 MIL/uL — ABNORMAL LOW (ref 3.87–5.11)
RDW: 13.5 % (ref 11.5–15.5)
WBC: 20 10*3/uL — ABNORMAL HIGH (ref 4.0–10.5)

## 2013-03-23 LAB — GLUCOSE, CAPILLARY
GLUCOSE-CAPILLARY: 224 mg/dL — AB (ref 70–99)
Glucose-Capillary: 186 mg/dL — ABNORMAL HIGH (ref 70–99)
Glucose-Capillary: 155 mg/dL — ABNORMAL HIGH (ref 70–99)
Glucose-Capillary: 121 mg/dL — ABNORMAL HIGH (ref 70–99)

## 2013-03-23 LAB — MRSA PCR SCREENING: MRSA BY PCR: NEGATIVE

## 2013-03-23 LAB — PROTIME-INR
INR: 1.22 (ref 0.00–1.49)
PROTHROMBIN TIME: 15.1 s (ref 11.6–15.2)

## 2013-03-23 LAB — I-STAT CG4 LACTIC ACID, ED: Lactic Acid, Venous: 2.53 mmol/L — ABNORMAL HIGH (ref 0.5–2.2)

## 2013-03-23 LAB — POC URINE PREG, ED: PREG TEST UR: NEGATIVE

## 2013-03-23 MED ORDER — MORPHINE SULFATE 4 MG/ML IJ SOLN
4.0000 mg | Freq: Once | INTRAMUSCULAR | Status: AC
  Administered 2013-03-23: 4 mg via INTRAVENOUS
  Filled 2013-03-23: qty 1

## 2013-03-23 MED ORDER — PIPERACILLIN-TAZOBACTAM 3.375 G IVPB
3.3750 g | Freq: Three times a day (TID) | INTRAVENOUS | Status: DC
  Administered 2013-03-23 – 2013-03-28 (×14): 3.375 g via INTRAVENOUS
  Filled 2013-03-23 (×17): qty 50

## 2013-03-23 MED ORDER — SODIUM CHLORIDE 0.9 % IV SOLN
1000.0000 mL | INTRAVENOUS | Status: DC
  Administered 2013-03-23: 1000 mL via INTRAVENOUS

## 2013-03-23 MED ORDER — HEPARIN SODIUM (PORCINE) 5000 UNIT/ML IJ SOLN
5000.0000 [IU] | Freq: Three times a day (TID) | INTRAMUSCULAR | Status: DC
  Administered 2013-03-23 – 2013-04-01 (×26): 5000 [IU] via SUBCUTANEOUS
  Filled 2013-03-23 (×30): qty 1

## 2013-03-23 MED ORDER — MORPHINE SULFATE 2 MG/ML IJ SOLN: 2.0000 mg | INTRAMUSCULAR | Status: DC | PRN

## 2013-03-23 MED ORDER — DEXTROSE-NACL 5-0.45 % IV SOLN
INTRAVENOUS | Status: DC
  Administered 2013-03-24: 03:00:00 via INTRAVENOUS
  Administered 2013-03-24: 100 mL/h via INTRAVENOUS

## 2013-03-23 MED ORDER — SODIUM CHLORIDE 0.9 % IV SOLN
1750.0000 mg | INTRAVENOUS | Status: DC
  Administered 2013-03-24 – 2013-03-25 (×2): 1750 mg via INTRAVENOUS
  Filled 2013-03-23 (×2): qty 1750

## 2013-03-23 MED ORDER — SODIUM CHLORIDE 0.9 % IV SOLN
Freq: Once | INTRAVENOUS | Status: AC
  Administered 2013-03-23: 14:00:00 via INTRAVENOUS

## 2013-03-23 MED ORDER — SODIUM CHLORIDE 0.9 % IV BOLUS (SEPSIS)
1000.0000 mL | Freq: Once | INTRAVENOUS | Status: AC
  Administered 2013-03-23: 1000 mL via INTRAVENOUS

## 2013-03-23 MED ORDER — DEXTROSE 50 % IV SOLN: 25.0000 mL | INTRAVENOUS | Status: DC | PRN

## 2013-03-23 MED ORDER — AMLODIPINE BESYLATE 10 MG PO TABS
10.0000 mg | ORAL_TABLET | Freq: Every day | ORAL | Status: DC
  Administered 2013-03-24 – 2013-03-25 (×2): 10 mg via ORAL
  Filled 2013-03-23 (×2): qty 1

## 2013-03-23 MED ORDER — ONDANSETRON HCL 4 MG/2ML IJ SOLN
4.0000 mg | Freq: Once | INTRAMUSCULAR | Status: AC
  Administered 2013-03-23: 4 mg via INTRAVENOUS
  Filled 2013-03-23: qty 2

## 2013-03-23 MED ORDER — ACETAMINOPHEN 500 MG PO TABS
500.0000 mg | ORAL_TABLET | Freq: Four times a day (QID) | ORAL | Status: DC | PRN
  Administered 2013-03-24 – 2013-04-10 (×7): 500 mg via ORAL
  Filled 2013-03-23 (×8): qty 1

## 2013-03-23 MED ORDER — ALBUTEROL SULFATE (2.5 MG/3ML) 0.083% IN NEBU: 3.0000 mL | INHALATION_SOLUTION | Freq: Four times a day (QID) | RESPIRATORY_TRACT | Status: DC | PRN

## 2013-03-23 MED ORDER — DEXTROSE-NACL 5-0.45 % IV SOLN
INTRAVENOUS | Status: DC
  Administered 2013-03-23: 16:00:00 via INTRAVENOUS

## 2013-03-23 MED ORDER — POTASSIUM CHLORIDE 10 MEQ/100ML IV SOLN
10.0000 meq | INTRAVENOUS | Status: AC
  Administered 2013-03-23 (×2): 10 meq via INTRAVENOUS
  Filled 2013-03-23 (×3): qty 100

## 2013-03-23 MED ORDER — HYDRALAZINE HCL 25 MG PO TABS
25.0000 mg | ORAL_TABLET | Freq: Three times a day (TID) | ORAL | Status: DC
  Administered 2013-03-23 – 2013-03-27 (×12): 25 mg via ORAL
  Filled 2013-03-23 (×17): qty 1

## 2013-03-23 MED ORDER — HYDROCODONE-ACETAMINOPHEN 5-325 MG PO TABS
1.0000 | ORAL_TABLET | Freq: Four times a day (QID) | ORAL | Status: DC | PRN
  Administered 2013-03-23 – 2013-03-26 (×5): 1 via ORAL
  Filled 2013-03-23 (×6): qty 1

## 2013-03-23 MED ORDER — SODIUM CHLORIDE 0.9 % IV SOLN: INTRAVENOUS | Status: DC

## 2013-03-23 MED ORDER — VANCOMYCIN HCL 10 G IV SOLR
2000.0000 mg | Freq: Once | INTRAVENOUS | Status: AC
  Administered 2013-03-23: 2000 mg via INTRAVENOUS
  Filled 2013-03-23: qty 2000

## 2013-03-23 MED ORDER — SODIUM CHLORIDE 0.9 % IV SOLN
INTRAVENOUS | Status: DC
  Administered 2013-03-24: 2.5 [IU]/h via INTRAVENOUS
  Administered 2013-03-26: 1.4 [IU]/h via INTRAVENOUS
  Filled 2013-03-23 (×3): qty 1

## 2013-03-23 MED ORDER — SODIUM CHLORIDE 0.9 % IV SOLN
INTRAVENOUS | Status: DC
  Administered 2013-03-23: 2.3 [IU]/h via INTRAVENOUS
  Filled 2013-03-23: qty 1

## 2013-03-23 MED ORDER — ATORVASTATIN CALCIUM 40 MG PO TABS
40.0000 mg | ORAL_TABLET | Freq: Every day | ORAL | Status: DC
  Administered 2013-03-24 – 2013-04-13 (×18): 40 mg via ORAL
  Filled 2013-03-23 (×22): qty 1

## 2013-03-23 MED ORDER — LIDOCAINE-EPINEPHRINE 2 %-1:100000 IJ SOLN
20.0000 mL | Freq: Once | INTRAMUSCULAR | Status: AC
  Administered 2013-03-23: 20 mL via INTRADERMAL
  Filled 2013-03-23: qty 1

## 2013-03-23 MED ORDER — CLINDAMYCIN PHOSPHATE 600 MG/50ML IV SOLN
600.0000 mg | Freq: Once | INTRAVENOUS | Status: AC
  Administered 2013-03-23: 600 mg via INTRAVENOUS
  Filled 2013-03-23: qty 50

## 2013-03-23 NOTE — Progress Notes (Signed)
ANTIBIOTIC CONSULT NOTE - INITIAL  Pharmacy Consult for Vancomycin and Zosyn Indication: L buttock cellulitis and abscess  Allergies  Allergen Reactions  . Contrast Media [Iodinated Diagnostic Agents] Nausea And Vomiting  . Paroxetine Nausea And Vomiting  . Oxycodone Itching and Rash    Patient Measurements: Height: 5\' 6"  (167.6 cm) Weight: 279 lb 1.6 oz (126.6 kg) IBW/kg (Calculated) : 59.3 Adjusted Body Weight: 86  Vital Signs: Temp: 99.2 F (37.3 C) (02/27 1544) Temp src: Oral (02/27 1544) BP: 122/104 mmHg (02/27 1800) Pulse Rate: 91 (02/27 1800) Intake/Output from previous day:   Intake/Output from this shift:    Labs:  Recent Labs  03/23/13 1227  WBC 21.8*  HGB 8.5*  PLT 241  CREATININE 2.35*   Estimated Creatinine Clearance: 42.4 ml/min (by C-G formula based on Cr of 2.35). No results found for this basename: VANCOTROUGH, VANCOPEAK, VANCORANDOM, GENTTROUGH, GENTPEAK, GENTRANDOM, TOBRATROUGH, TOBRAPEAK, TOBRARND, AMIKACINPEAK, AMIKACINTROU, AMIKACIN,  in the last 72 hours   Microbiology: No results found for this or any previous visit (from the past 720 hour(s)).  Medical History: Past Medical History  Diagnosis Date  . Diastolic heart failure     echo 11/03/10 EF 55-60%  . Hypertension   . Chronic kidney disease (CKD)     stage II-III  . Hyperlipidemia   . Uncontrolled diabetes mellitus   . Diabetic ketoacidosis     with seizures  . Anemia   . Diabetic gastroparesis   . Obesity   . Coronary artery disease     mild per cath 2010  . CKD (chronic kidney disease), stage IV 01/20/2012    Assessment: 42yo obese F with hyperglycemia and left buttock pain and swelling. Found to have a left buttock cellulitis and abscess. In the ED underwent I&D and received clindamycin. Pharmacy is asked to dose Zosyn and Vanc.  2/27 >> Zosyn >> 2/27 >> Vanc >>    Tmax: WBCs: Renal: SCr 2.4, 42CG, 35N  Goal of Therapy:  Vancomycin trough level 15-20  mcg/ml Dosing appropriate for renal function  Plan:   Vancomycin 2g x 1 then 1750 IV q24h  Zosyn 3.375g IV Q8H infused over 4hrs.  Measure Vanc trough at steady state.  Follow up renal fxn and culture results.  Charolotte Ekeom Cassie Shedlock, PharmD, pager [phone]. 03/23/2013,7:07 PM.

## 2013-03-23 NOTE — ED Notes (Signed)
Attempted to get urine sample from pt when she first got to her room, pt stated that she didn't have to use the restroom at the time, will try back in 15 min

## 2013-03-23 NOTE — ED Notes (Signed)
Attempted to get a urine sample from pt, she stated that she still didn't have to use the restroom

## 2013-03-23 NOTE — H&P (Signed)
[facility] History and Physical  Beth L Breach AVW:[id number] DOB: 03-May-1970 DOA: 03/23/2013  Referring physician:  PCP: Dois DavenportICHTER,KAREN L., MD   Chief Complaint: Elevated blood sugars  HPI: Jasmine [name] is a 43 y.o. female with a past medical history of poorly controlled insulin-dependent diabetes mellitus, nonobstructive coronary artery disease per cardiac catheterization in 2010, hypertension, stage III chronic kidney disease, presenting to the emergency department with complaints of elevated blood sugars. She reports developing polydipsia, nausea, vomiting, headache, malaise, weakness over the past 24 hours. Patient reporting glucometer broke her home could not check blood sugars for which she presented to the urgent care Center today. There she was told that she had elevated blood sugars and instructed to come to the emergency room. She has also had pain and swelling to her left buttock region over the past 3 days. this was assessed in the emergency room, found to have a left buttock abscess undergoing incision and drainage, procedure performed by Dr. Elesa MassedWard of emergency medicine. Wound was packed with gauze. Lab work in the emergency room revealing a blood sugar of 446 with a bicarbonate of 18 and anion gap of 19. CBC showed a white count of 21,800. She was given a dose of clindamycin and started on IV insulin in the emergency room.                                                                                                                                                                                                                                                                                     Review of Systems:  Constitutional:  No weight loss, night sweats, Positive for Fevers, chills, fatigue.  HEENT:  No headaches, Difficulty swallowing,Tooth/dental problems,Sore throat,  No sneezing, itching, ear ache, nasal congestion, post nasal drip,  Cardio-vascular:  No chest pain,  Orthopnea, PND, swelling in lower extremities, anasarca, dizziness, palpitations  GI:  No heartburn, indigestion, abdominal pain, positive for nausea, vomiting Resp:  No shortness of breath with exertion or at rest. No excess mucus, no productive cough, No non-productive cough, No coughing up of blood.No change in color of mucus.No wheezing.No chest wall deformity  Skin:  No  rashes GU:  no dysuria, change in color of urine, no urgency or frequency. No flank pain.  Musculoskeletal:  No joint pain or swelling. No decreased range of motion. No back pain.  Psych:  No change in mood or affect. No depression or anxiety. No memory loss.   Past Medical History  Diagnosis Date  . Diastolic heart failure     echo 11/03/10 EF 55-60%  . Hypertension   . Chronic kidney disease (CKD)     stage II-III  . Hyperlipidemia   . Uncontrolled diabetes mellitus   . Diabetic ketoacidosis     with seizures  . Anemia   . Diabetic gastroparesis   . Obesity   . Coronary artery disease     mild per cath 2010  . CKD (chronic kidney disease), stage IV 01/20/2012   Past Surgical History  Procedure Laterality Date  . Cholecystectomy    . Carpal tunnel release  2003   Social History:  reports that she has never smoked. She has never used smokeless tobacco. She reports that she drinks alcohol. She reports that she does not use illicit drugs.  Allergies  Allergen Reactions  . Contrast Media [Iodinated Diagnostic Agents] Nausea And Vomiting  . Paroxetine Nausea And Vomiting  . Oxycodone Itching and Rash    Family History  Problem Relation Age of Onset  . Heart attack Mother   . Dementia Father      Prior to Admission medications   Medication Sig Start Date End Date Taking? Authorizing Provider  acetaminophen (TYLENOL) 500 MG tablet Take 500 mg by mouth every 6 (six) hours as needed for mild pain.   Yes Historical Provider, MD  albuterol (PROVENTIL HFA;VENTOLIN HFA) 108 (90 BASE) MCG/ACT inhaler  Inhale 2 puffs into the lungs every 6 (six) hours as needed for wheezing. 10/25/12  Yes Amy D Clegg, NP  amLODipine (NORVASC) 10 MG tablet Take 1 tablet (10 mg total) by mouth daily. 11/20/12  Yes Dolores Patty, MD  aspirin 81 MG tablet Take 81 mg by mouth daily as needed. For pain   Yes Historical Provider, MD  atorvastatin (LIPITOR) 40 MG tablet Take 1 tablet (40 mg total) by mouth daily at 6 PM. 11/20/12  Yes Dolores Patty, MD  bisoprolol-hydrochlorothiazide [facility]) 5-6.25 MG per tablet Take 1 tablet by mouth daily. 11/15/12  Yes Dolores Patty, MD  ferrous sulfate 325 (65 FE) MG tablet Take 325 mg by mouth 2 (two) times daily.     Yes Historical Provider, MD  FREESTYLE LITE test strip Twice daily before meals. 11/11/10  Yes Historical Provider, MD  hydrALAZINE (APRESOLINE) 25 MG tablet Take 1 tablet (25 mg total) by mouth 3 (three) times daily. 11/20/12  Yes Dolores Patty, MD  Hydrocodone-Acetaminophen 5-300 MG TABS Take 1 tablet by mouth 3 (three) times daily as needed. For pain 10/30/12  Yes Nyoka Cowden, MD  Insulin Aspart Prot & Aspart (NOVOLOG MIX 70/30 FLEXPEN) (70-30) 100 UNIT/ML SUPN Inject 30-40 Units into the skin 2 (two) times daily with a meal. Take 40 units in the am & 30 units in the pm   Yes Historical Provider, MD  Lancets (FREESTYLE) lancets  11/11/10  Yes Historical Provider, MD  metolazone (ZAROXOLYN) 2.5 MG tablet Take 1 tablet (2.5 mg total) by mouth as directed. 02/13/13  Yes Dolores Patty, MD   Physical Exam: Filed Vitals:   03/23/13 1211  BP: 140/51  Pulse:   Temp:   Resp:  BP 140/51  Pulse 75  Temp(Src) 98.5 F (36.9 C) (Oral)  Resp 18  SpO2 100%  LMP 02/26/2013  General:  Appears calm and comfortable, no acute distress. Eyes: PERRL, normal lids, irises & conjunctiva, dry oral mucosa ENT: grossly normal hearing, lips & tongue Neck: no LAD, masses or thyromegaly Cardiovascular: RRR, tachycardic, no m/r/g. No LE edema. Telemetry:  SR, no arrhythmias  Respiratory: CTA bilaterally, no w/r/r. Normal respiratory effort. Abdomen: Obese, soft, ntnd, no rebound tenderness or guarding Skin: Left but up abscess was incised and drained in the emergency room, presently having packing in place. Do not no purulence or active bleed from site Musculoskeletal: grossly normal tone BUE/BLE Psychiatric: grossly normal mood and affect, speech fluent and appropriate Neurologic: grossly non-focal.          Labs on Admission:  Basic Metabolic Panel:  Recent Labs Lab 03/23/13 1227  NA 130*  K 4.0  CL 93*  CO2 18*  GLUCOSE 446*  BUN 85*  CREATININE 2.35*  CALCIUM 8.7   Liver Function Tests: No results found for this basename: AST, ALT, ALKPHOS, BILITOT, PROT, ALBUMIN,  in the last 168 hours No results found for this basename: LIPASE, AMYLASE,  in the last 168 hours No results found for this basename: AMMONIA,  in the last 168 hours CBC:  Recent Labs Lab 03/23/13 1227  WBC 21.8*  NEUTROABS 19.3*  HGB 8.5*  HCT 25.3*  MCV 87.2  PLT 241   Cardiac Enzymes: No results found for this basename: CKTOTAL, CKMB, CKMBINDEX, TROPONINI,  in the last 168 hours  BNP (last 3 results)  Recent Labs  10/07/12 2126 11/01/12 1446  PROBNP 1631.0* 2441.0*   CBG:  Recent Labs Lab 03/23/13 1205 03/23/13 1449  GLUCAP 446* 313*    Radiological Exams on Admission: No results found.  EKG: Independently reviewed.   Assessment/Plan Active Problems:   DKA (diabetic ketoacidoses)   Left buttock abscess   Acute-on-chronic kidney injury   Sepsis   DIABETES MELLITUS, TYPE I   OSA (obstructive sleep apnea)   Anemia, chronic disease   Leukocytosis   1. Diabetic ketoacidosis. I suspect left buttock abscesses may have precipitated DKA. She presents with a bicarbonate of 18 having an anion gap of 19. Will place her in the step down unit, start IV insulin per DKA protocol, will follow serial BMPs, provide IV fluid resuscitation,  check a hemoglobin A1c. Followup protocol, transition to basal insulin once Has been closed and she is tolerating by mouth intake. 2. Sepsis, present on admission, evidenced by a white count of 21,800, presence of DKA, acute on chronic renal failure with source of infection likely to be left buttock abscess. Patient undergoing incision and drainage in the emergency room. Obtain blood cultures followup on wound cultures. Start her on broad-spectrum IV antibiotic therapy with vancomycin and Zosyn. 3. Left buttock abscess. As I mentioned above, patient undergoing incision and drainage in the ER, wound currently packed. Followup on wound and blood cultures, continue IV antibiotic therapy with Vancomycin and Zosyn for now. 4.  History of diastolic congestive heart failure. Last transthoracic echocardiogram performed on 10/08/2012, showing an ejection fraction of 55-60% with grade 2 diastolic dysfunction. Given her current state with diabetic ketoacidosis, starting IV fluid resuscitation however will need to follow closely for fluid overload. Diuretics are being discontinued on admission. 5. Acute on chronic renal failure. Initial lab work showed a creatinine of 2.35, elevated to 1.75 on 12/06/2012, likely secondary to prerenal azotemia, providing IV  fluid resuscitation. 6. Hypertension. Continue amlodipine and hydralazine 7. Obstructive sleep apnea. Provide nightly CPAP 8. DVT prophylaxis. Heparin subcutaneous   Code Status: Full code Family Communication: Family members not present in room Disposition Plan: I anticipate she will require greater than 2 nights hospitalization  Time spent: 70 min  Jeralyn Bennett [facility] Pager [phone]

## 2013-03-23 NOTE — ED Notes (Signed)
Per pt, was sent here from PCP for elevated WBC and high sugar-states she has an abscess on her buttocks but did not tell PCP

## 2013-03-23 NOTE — ED Notes (Signed)
Sent from md Cloward [phone]

## 2013-03-23 NOTE — ED Notes (Addendum)
Pt reports hyperglycemia, has been taking DM meds but states she has an abscess on her buttock x4 days. Pt also c/o HA and nausea. Pt is A&O and in NAD

## 2013-03-23 NOTE — ED Provider Notes (Addendum)
TIME SEEN: 12:14 PM  CHIEF COMPLAINT: Hyperglycemia, abscess  HPI: 43 y.o [place] American female with history of morbid obesity, nonobstructive CAD per cath 2010, HTN, uncontrolled DM, class III CKD and diastolic HF (Echo on November 03, 2010, EF 55-60%) who presents emergency department with an abscess to her left buttock that she noticed 3 days ago without drainage. She also reports she has had a diffuse, throbbing headache and went to see her primary care physician today as her pain was not getting better with Vicodin or ibuprofen. Her primary care physician ordered lab work and noted the patient had a leukocytosis and was hyperglycemic. She was sent to the emergency room for further evaluation. Patient is on insulin for her diabetes. She states she has had DKA in the past. Patient complains of vomiting, chills. No chest pain, shortness of breath, abdominal pain, diarrhea.   ROS: See HPI Constitutional: no fever  Eyes: no drainage  ENT: no runny nose   Cardiovascular:  no chest pain  Resp: no SOB  GI: vomiting GU: no dysuria Integumentary: no rash  Allergy: no hives  Musculoskeletal: no leg swelling  Neurological: no slurred speech ROS otherwise negative  PAST MEDICAL HISTORY/PAST SURGICAL HISTORY:  Past Medical History  Diagnosis Date  . Diastolic heart failure     echo 11/03/10 EF 55-60%  . Hypertension   . Chronic kidney disease (CKD)     stage II-III  . Hyperlipidemia   . Uncontrolled diabetes mellitus   . Diabetic ketoacidosis     with seizures  . Anemia   . Diabetic gastroparesis   . Obesity   . Coronary artery disease     mild per cath 2010  . CKD (chronic kidney disease), stage IV 01/20/2012    MEDICATIONS:  Prior to Admission medications   Medication Sig Start Date End Date Taking? Authorizing Provider  albuterol (PROVENTIL HFA;VENTOLIN HFA) 108 (90 BASE) MCG/ACT inhaler Inhale 2 puffs into the lungs every 6 (six) hours as needed for wheezing. 10/25/12   Amy D  Clegg, NP  amLODipine (NORVASC) 10 MG tablet Take 1 tablet (10 mg total) by mouth daily. 11/20/12   Dolores Patty, MD  aspirin 81 MG tablet Take 81 mg by mouth daily as needed. For pain    Historical Provider, MD  atorvastatin (LIPITOR) 40 MG tablet Take 1 tablet (40 mg total) by mouth daily at 6 PM. 11/20/12   Dolores Patty, MD  bisoprolol-hydrochlorothiazide [facility]) 5-6.25 MG per tablet Take 1 tablet by mouth daily. 11/15/12   Dolores Patty, MD  famotidine (PEPCID) 20 MG tablet Take 20 mg by mouth at bedtime.    Historical Provider, MD  ferrous sulfate 325 (65 FE) MG tablet Take 325 mg by mouth 2 (two) times daily.      Historical Provider, MD  FREESTYLE LITE test strip Twice daily before meals. 11/11/10   Historical Provider, MD  furosemide (LASIX) 80 MG tablet Take 1 tablet (80 mg total) by mouth 2 (two) times daily. 11/15/12   Dolores Patty, MD  hydrALAZINE (APRESOLINE) 25 MG tablet Take 1 tablet (25 mg total) by mouth 3 (three) times daily. 11/20/12   Dolores Patty, MD  Hydrocodone-Acetaminophen 5-300 MG TABS Take 1 tablet by mouth 3 (three) times daily as needed. For pain 10/30/12   Nyoka Cowden, MD  Insulin Aspart Prot & Aspart (NOVOLOG MIX 70/30 FLEXPEN) (70-30) 100 UNIT/ML SUPN Inject 30-40 Units into the skin 2 (two) times daily with a meal.  Take 40 units in the am & 30 units in the pm    Historical Provider, MD  Lancets (FREESTYLE) lancets  11/11/10   Historical Provider, MD  metoCLOPramide (REGLAN) 10 MG tablet Take 1 tablet (10 mg total) by mouth 4 (four) times daily. 10/22/12   Maryann Mikhail, DO  metolazone (ZAROXOLYN) 2.5 MG tablet Take 1 tablet (2.5 mg total) by mouth as directed. 02/13/13   Dolores Pattyaniel R [name], MD  pantoprazole (PROTONIX) 40 MG tablet Take 1 tablet (40 mg total) by mouth at bedtime. 10/22/12   Maryann Mikhail, DO    ALLERGIES:  Allergies  Allergen Reactions  . Contrast Media [Iodinated Diagnostic Agents] Nausea And Vomiting  . Paroxetine  Nausea And Vomiting  . Oxycodone Itching and Rash    SOCIAL HISTORY:  History  Substance Use Topics  . Smoking status: Never Smoker   . Smokeless tobacco: Never Used  . Alcohol Use: Yes     Comment: occasionally    FAMILY HISTORY: Family History  Problem Relation Age of Onset  . Heart attack Mother   . Dementia Father     EXAM: BP 140/51  Pulse 75  Temp(Src) 98.5 F (36.9 C) (Oral)  Resp 18  SpO2 100%  LMP 02/26/2013 CONSTITUTIONAL: Alert and oriented x 3 and responds appropriately to questions. Well-appearing; well-nourished, appears uncomfortable HEAD: Normocephalic EYES: Conjunctivae clear, PERRL ENT: normal nose; no rhinorrhea; dry mucous membranes; pharynx without lesions noted NECK: Supple, no meningismus, no LAD  CARD: Regular and tachycardic; S1 and S2 appreciated; no murmurs, no clicks, no rubs, no gallops RESP: Normal chest excursion without splinting or tachypnea; breath sounds clear and equal bilaterally; no wheezes, no rhonchi, no rales,  ABD/GI: Normal bowel sounds; non-distended; soft, non-tender, no rebound, no guarding RECTAL:  Normal rectal tone, no gross blood, No rectal abscess, patient has a 6 x 4 cm indurated area to left buttock near the gluteal cleft with no drainage with associated erythema and warmth and induration BACK:  The back appears normal and is non-tender to palpation, there is no CVA tenderness EXT: Normal ROM in all joints; non-tender to palpation; no edema; normal capillary refill; no cyanosis    SKIN: Normal color for age and race; warm NEURO: Moves all extremities equally PSYCH: The patient's mood and manner are appropriate. Grooming and personal hygiene are appropriate.  MEDICAL DECISION MAKING: Patient here with buttock abscess, hyperglycemia. Concern for possible DKA in the setting of infection. We'll give IV fluids, obtain labs. Will I&D abscess.  Patient will likely need admission.  ED PROGRESS: Patient's labs show leukocytosis  of 21.8 with left shift. Her lactate is also slightly elevated at 2.53. We'll continue IV fluids and give IV clindamycin.   2:15 PM  Pt's labs show anion gap of 19, pH of 7.389. Patient and DKA. We'll start insulin drip and continue IV fluids. We'll discuss with hospitalist for admission.  2:49 PM  Repeat glucose is 313.  D/w hospitalist for admission.    INCISION AND DRAINAGE Performed by: Raelyn NumberWARD, Myrel Rappleye N Consent: Verbal consent obtained. Risks and benefits: risks, benefits and alternatives were discussed Type: abscess  Body area: left buttock  Anesthesia: local infiltration  Incision was made with a scalpel.  Local anesthetic: lidocaine 2% with epinephrine  Anesthetic total: 10 ml  Complexity: complex Blunt dissection to break up loculations  Drainage: purulent  Drainage amount: 10mL  Packing material: 1/4 in iodoform gauze  Patient tolerance: Patient tolerated the procedure well with no immediate complications.  CRITICAL CARE Performed by: Raelyn Number   Total critical care time: 45 minutes  Critical care time was exclusive of separately billable procedures and treating other patients.  Critical care was necessary to treat or prevent imminent or life-threatening deterioration.  Critical care was time spent personally by me on the following activities: development of treatment plan with patient and/or surrogate as well as nursing, discussions with consultants, evaluation of patient's response to treatment, examination of patient, obtaining history from patient or surrogate, ordering and performing treatments and interventions, ordering and review of laboratory studies, ordering and review of radiographic studies, pulse oximetry and re-evaluation of patient's condition.   Layla Maw Cali Cuartas, DO 03/23/13 1452  Layla Maw Shondrea Steinert, DO 03/23/13 1453

## 2013-03-23 NOTE — ED Notes (Signed)
Pt had 1 episode of emesis right after Tylenol admin, did not keep it down

## 2013-03-23 NOTE — Progress Notes (Signed)
Utilization Review completed.  Destin Vinsant RN CM  

## 2013-03-24 ENCOUNTER — Inpatient Hospital Stay (HOSPITAL_COMMUNITY): Payer: 59

## 2013-03-24 DIAGNOSIS — G4733 Obstructive sleep apnea (adult) (pediatric): Secondary | ICD-10-CM

## 2013-03-24 LAB — BASIC METABOLIC PANEL
BUN: 77 mg/dL — AB (ref 6–23)
BUN: 78 mg/dL — AB (ref 6–23)
BUN: 80 mg/dL — AB (ref 6–23)
BUN: 79 mg/dL — ABNORMAL HIGH (ref 6–23)
BUN: 78 mg/dL — AB (ref 6–23)
BUN: 73 mg/dL — ABNORMAL HIGH (ref 6–23)
CALCIUM: 8.2 mg/dL — AB (ref 8.4–10.5)
CALCIUM: 8.5 mg/dL (ref 8.4–10.5)
CALCIUM: 8.4 mg/dL (ref 8.4–10.5)
CHLORIDE: 101 meq/L (ref 96–112)
CO2: 16 mEq/L — ABNORMAL LOW (ref 19–32)
CO2: 16 mEq/L — ABNORMAL LOW (ref 19–32)
CO2: 16 mEq/L — ABNORMAL LOW (ref 19–32)
CO2: 17 mEq/L — ABNORMAL LOW (ref 19–32)
CO2: 17 mEq/L — ABNORMAL LOW (ref 19–32)
CO2: 17 meq/L — AB (ref 19–32)
CREATININE: 2.63 mg/dL — AB (ref 0.50–1.10)
CREATININE: 2.69 mg/dL — AB (ref 0.50–1.10)
CREATININE: 2.59 mg/dL — AB (ref 0.50–1.10)
CREATININE: 2.66 mg/dL — AB (ref 0.50–1.10)
Calcium: 8.3 mg/dL — ABNORMAL LOW (ref 8.4–10.5)
Calcium: 8.5 mg/dL (ref 8.4–10.5)
Calcium: 8.7 mg/dL (ref 8.4–10.5)
Chloride: 100 mEq/L (ref 96–112)
Chloride: 100 mEq/L (ref 96–112)
Chloride: 102 mEq/L (ref 96–112)
Chloride: 102 mEq/L (ref 96–112)
Chloride: 99 mEq/L (ref 96–112)
Creatinine, Ser: 2.48 mg/dL — ABNORMAL HIGH (ref 0.50–1.10)
Creatinine, Ser: 2.67 mg/dL — ABNORMAL HIGH (ref 0.50–1.10)
GFR calc Af Amer: 24 mL/min — ABNORMAL LOW (ref >90)
GFR calc Af Amer: 25 mL/min — ABNORMAL LOW (ref >90)
GFR calc Af Amer: 26 mL/min — ABNORMAL LOW (ref >90)
GFR calc non Af Amer: 21 mL/min — ABNORMAL LOW (ref >90)
GFR calc non Af Amer: 22 mL/min — ABNORMAL LOW (ref >90)
GFR calc non Af Amer: 23 mL/min — ABNORMAL LOW (ref >90)
GFR, EST AFRICAN AMERICAN: 24 mL/min — AB (ref >90)
GFR, EST AFRICAN AMERICAN: 25 mL/min — AB (ref >90)
GFR, EST AFRICAN AMERICAN: 24 mL/min — AB (ref >90)
GFR, EST NON AFRICAN AMERICAN: 21 mL/min — AB (ref >90)
GFR, EST NON AFRICAN AMERICAN: 21 mL/min — AB (ref >90)
GFR, EST NON AFRICAN AMERICAN: 21 mL/min — AB (ref >90)
GLUCOSE: 166 mg/dL — AB (ref 70–99)
GLUCOSE: 176 mg/dL — AB (ref 70–99)
GLUCOSE: 211 mg/dL — AB (ref 70–99)
Glucose, Bld: 133 mg/dL — ABNORMAL HIGH (ref 70–99)
Glucose, Bld: 162 mg/dL — ABNORMAL HIGH (ref 70–99)
Glucose, Bld: 230 mg/dL — ABNORMAL HIGH (ref 70–99)
POTASSIUM: 3.8 meq/L (ref 3.7–5.3)
Potassium: 3.7 mEq/L (ref 3.7–5.3)
Potassium: 3.9 mEq/L (ref 3.7–5.3)
Potassium: 3.9 mEq/L (ref 3.7–5.3)
Potassium: 3.7 mEq/L (ref 3.7–5.3)
Potassium: 3.4 mEq/L — ABNORMAL LOW (ref 3.7–5.3)
SODIUM: 135 meq/L — AB (ref 137–147)
Sodium: 133 mEq/L — ABNORMAL LOW (ref 137–147)
Sodium: 134 mEq/L — ABNORMAL LOW (ref 137–147)
Sodium: 135 mEq/L — ABNORMAL LOW (ref 137–147)
Sodium: 135 mEq/L — ABNORMAL LOW (ref 137–147)
Sodium: 135 mEq/L — ABNORMAL LOW (ref 137–147)

## 2013-03-24 LAB — GLUCOSE, CAPILLARY
GLUCOSE-CAPILLARY: 145 mg/dL — AB (ref 70–99)
GLUCOSE-CAPILLARY: 180 mg/dL — AB (ref 70–99)
GLUCOSE-CAPILLARY: 199 mg/dL — AB (ref 70–99)
GLUCOSE-CAPILLARY: 134 mg/dL — AB (ref 70–99)
GLUCOSE-CAPILLARY: 159 mg/dL — AB (ref 70–99)
Glucose-Capillary: 108 mg/dL — ABNORMAL HIGH (ref 70–99)
Glucose-Capillary: 135 mg/dL — ABNORMAL HIGH (ref 70–99)
Glucose-Capillary: 163 mg/dL — ABNORMAL HIGH (ref 70–99)
Glucose-Capillary: 165 mg/dL — ABNORMAL HIGH (ref 70–99)
Glucose-Capillary: 172 mg/dL — ABNORMAL HIGH (ref 70–99)
Glucose-Capillary: 175 mg/dL — ABNORMAL HIGH (ref 70–99)
Glucose-Capillary: 176 mg/dL — ABNORMAL HIGH (ref 70–99)
Glucose-Capillary: 214 mg/dL — ABNORMAL HIGH (ref 70–99)
Glucose-Capillary: 164 mg/dL — ABNORMAL HIGH (ref 70–99)
Glucose-Capillary: 172 mg/dL — ABNORMAL HIGH (ref 70–99)
Glucose-Capillary: 185 mg/dL — ABNORMAL HIGH (ref 70–99)
Glucose-Capillary: 143 mg/dL — ABNORMAL HIGH (ref 70–99)
Glucose-Capillary: 170 mg/dL — ABNORMAL HIGH (ref 70–99)
Glucose-Capillary: 166 mg/dL — ABNORMAL HIGH (ref 70–99)
Glucose-Capillary: 157 mg/dL — ABNORMAL HIGH (ref 70–99)

## 2013-03-24 LAB — PRO B NATRIURETIC PEPTIDE: Pro B Natriuretic peptide (BNP): 6479 pg/mL — ABNORMAL HIGH (ref 0–125)

## 2013-03-24 LAB — TROPONIN I
Troponin I: <0.3 ng/mL (ref <0.30)
Troponin I: <0.3 ng/mL (ref <0.30)
Troponin I: <0.3 ng/mL (ref <0.30)

## 2013-03-24 LAB — HEMOGLOBIN A1C
HEMOGLOBIN A1C: 9.6 % — AB (ref <5.7)
MEAN PLASMA GLUCOSE: 229 mg/dL — AB (ref <117)

## 2013-03-24 MED ORDER — NITROGLYCERIN 2 % TD OINT
0.5000 [in_us] | TOPICAL_OINTMENT | Freq: Four times a day (QID) | TRANSDERMAL | Status: DC
  Administered 2013-03-24 – 2013-03-27 (×12): 0.5 [in_us] via TOPICAL
  Filled 2013-03-24: qty 30

## 2013-03-24 MED ORDER — FUROSEMIDE 10 MG/ML IJ SOLN: 40.0000 mg | Freq: Once | INTRAMUSCULAR | Status: DC

## 2013-03-24 MED ORDER — SODIUM CHLORIDE 0.9 % IV SOLN
INTRAVENOUS | Status: DC
  Administered 2013-03-24: 20 mL/h via INTRAVENOUS
  Administered 2013-03-28: 250 mL via INTRAVENOUS

## 2013-03-24 MED ORDER — FENTANYL CITRATE 0.05 MG/ML IJ SOLN
25.0000 ug | INTRAMUSCULAR | Status: DC | PRN
  Administered 2013-03-24 – 2013-03-26 (×7): 50 ug via INTRAVENOUS
  Filled 2013-03-24 (×7): qty 2

## 2013-03-24 MED ORDER — DEXTROSE-NACL 5-0.45 % IV SOLN
INTRAVENOUS | Status: DC
  Administered 2013-03-25: 04:00:00 via INTRAVENOUS
  Filled 2013-03-24 (×3): qty 1000

## 2013-03-24 MED ORDER — FUROSEMIDE 10 MG/ML IJ SOLN
INTRAMUSCULAR | Status: AC
  Administered 2013-03-24: 40 mg via INTRAVENOUS
  Filled 2013-03-24: qty 4

## 2013-03-24 MED ORDER — FUROSEMIDE 10 MG/ML IJ SOLN
INTRAMUSCULAR | Status: AC
  Administered 2013-03-24: 20 mg via INTRAVENOUS
  Filled 2013-03-24: qty 2

## 2013-03-24 MED ORDER — NITROGLYCERIN 0.4 MG SL SUBL
0.4000 mg | SUBLINGUAL_TABLET | SUBLINGUAL | Status: DC | PRN
  Administered 2013-03-24 (×2): 0.4 mg via SUBLINGUAL

## 2013-03-24 MED ORDER — FUROSEMIDE 10 MG/ML IJ SOLN: 20.0000 mg | Freq: Once | INTRAMUSCULAR | Status: AC

## 2013-03-24 MED ORDER — FUROSEMIDE 10 MG/ML IJ SOLN: 40.0000 mg | Freq: Two times a day (BID) | INTRAMUSCULAR | Status: DC

## 2013-03-24 MED ORDER — NITROGLYCERIN 0.4 MG SL SUBL
SUBLINGUAL_TABLET | SUBLINGUAL | Status: AC
  Filled 2013-03-24: qty 25

## 2013-03-24 MED ORDER — FUROSEMIDE 10 MG/ML IJ SOLN
40.0000 mg | Freq: Two times a day (BID) | INTRAMUSCULAR | Status: DC
  Administered 2013-03-24 – 2013-03-25 (×2): 40 mg via INTRAVENOUS
  Filled 2013-03-24 (×3): qty 4

## 2013-03-24 MED ORDER — ASPIRIN EC 81 MG PO TBEC
81.0000 mg | DELAYED_RELEASE_TABLET | Freq: Every day | ORAL | Status: DC
  Administered 2013-03-24 – 2013-03-26 (×3): 81 mg via ORAL
  Filled 2013-03-24 (×4): qty 1

## 2013-03-24 NOTE — Progress Notes (Signed)
Patient reassessed, notified by RN that her oxygen requirement has increased to 5L via [place]. During my encounter she appears mildly dyspneic, alert and oriented x 3. Labs were reviewed, has an elevated BNP of 6,479. On exam has developed bilateral crackles. She was administered IV fluids as part of the DKA protocol. D5 1/2 NS rate brought down to 5175ml/hour, as her last BMP showed a bicarb of 17. I have ordered a total of 60mg  of IV lasix to be administered now, and continuing lasix at 40mg  IV BID. CXR pending. Will follow.

## 2013-03-24 NOTE — Progress Notes (Signed)
Pt up w/ 1 person asst to BR.  Steady gait noted.  Voided w/o difficulty.  On getting back in bed, pt c/o left sided chest.   Pain.  Vitals- 134/51 86 19 92% 2l/min [place].  Elray McgregorMary Lynch, NP notified. Order for nitroglycerin rec'd.

## 2013-03-24 NOTE — Progress Notes (Signed)
[facility] PROGRESS NOTE  Jasmine [name] JWJ:[id number] DOB: 14-Aug-1970 DOA: 03/23/2013 PCP: Dois Davenport., MD  Assessment/Plan: 1. Diabetic ketoacidosis. Is possible that underlying infectious process may have precipitated DKA. Most recent bicarbonate level at 16, will continue IV insulin per DKA protocol, follow serial BMPs. She is receiving IV fluid resuscitation based on protocol and swallow monitor closely for evidence of fluid overload given her history of CHF. She remains n.p.o. 2. Chest pain. Overnight patient complaining of chest discomfort, currently symptom-free. First troponin negative. Per medical records she has a history of nonobstructive coronary artery disease based on a cardiac catheterization in 2010. Has been followed by cardiology at the heart failure clinic. Will followup on serial troponins. Aspirin restarted. 3. Chronic diastolic congestive heart failure. Transthoracic echocardiogram performed on 10/08/2012 showing ejection fraction of 55-60% with grade 2 diastolic dysfunction. She is receiving IV fluid resuscitation given present state of diabetic ketoacidosis, following closely for fluid overload. At the present time she does not appear to have clinical evidence of acute decompensated congestive heart failure. Will obtain a BNP of her overnight complaints of chest pain.  4. Acute on chronic renal failure. Patient's creatinine remaining at 2.69 on 03/24/2013. Looking back at her records it appears that her baseline creatinine fluctuates between 1.7 to 2.0. She is receiving IV fluid resuscitation. 5. Left buttock abscess, patient undergoing incision and drainage in the emergency room. Cultures are pending at the time of this dictation, continue vancomycin and Zosyn. Lab work showing white count of 20,000. Wound care consultation placed for packing changes and recommendations  Code Status: Full code Family Communication: Family not present Disposition Plan: Continue  monitoring and treatment in the step down unit   Procedures:  Incision and drainage of left buttock abscess on 03/23/2013  Antibiotics:  Vancomycin (started on 03/23/2013)  Zosyn (started on 03/23/2013)  HPI/Subjective: Patient is a pleasant 43 year old female with a past medical history of poorly controlled insulin-dependent diabetes mellitus, nonobstructive coronary artery disease based on cardiac catheterization in 2010, chronic diastolic congestive heart failure, hypertension, stage III chronic kidney disease who was admitted to the medicine service on 03/23/2013 presenting with complaints of elevated blood sugars. She had complained of polydipsia, nausea, vomiting, headache, malaise, weakness over the 24 hours prior to admission. In the emergency room she was found to have a left buttock abscess undergoing incision and drainage. Labs indicating diabetic ketoacidosis as she was started on IV insulin place on the DKA protocol and admitted to the step down unit. She was started on empiric IV antibiotic therapy with Zosyn and vancomycin. On the evening of 03/23/2013 she complained of chest pain as troponin level was negative. Patient reported feeling quite anxious at the time symptoms occurred. On the morning of 03/24/2013 she remains on IV insulin and DKA protocol as her bicarbonate level at 16 based on the most recent BMP.   Objective: Filed Vitals:   03/24/13 0800  BP: 126/45  Pulse: 81  Temp:   Resp: 24    Intake/Output Summary (Last 24 hours) at 03/24/13 0818 Last data filed at 03/24/13 0800  Gross per 24 hour  Intake 1308.46 ml  Output      0 ml  Net 1308.46 ml   Filed Weights   03/23/13 1211 03/24/13 0000  Weight: 126.6 kg (279 lb 1.6 oz) 128.4 kg (283 lb 1.1 oz)    Exam:   General:  Patient appears mildly anxious this morning however in no significant distress. Currently denies chest pain.  Cardiovascular:  Regular rate and rhythm normal S1-S2 no murmurs rubs or  gallops  Respiratory: Clear to auscultation bilaterally no wheezing rhonchi or rales  Abdomen: Soft nontender nondistended  Musculoskeletal: No edema  Skin: Status post incision and drainage of left buttock abscess, area inspected, wound packed. Diminished erythema to area compared to yesterday's exam. There is induration noted   Data Reviewed: Basic Metabolic Panel:  Recent Labs Lab 03/23/13 2215 03/24/13 0015 03/24/13 0222 03/24/13 0405 03/24/13 0700  NA 135* 133* 134* 135* 135*  K 4.1 3.8 3.7 3.9 3.9  CL 101 99 101 102 102  CO2 18* 16* 16* 17* 16*  GLUCOSE 127* 133* 166* 176* 211*  BUN 77* 78* 80* 78* 77*  CREATININE 2.35* 2.48* 2.63* 2.67* 2.69*  CALCIUM 8.7 8.3* 8.2* 8.5 8.5   Liver Function Tests: No results found for this basename: AST, ALT, ALKPHOS, BILITOT, PROT, ALBUMIN,  in the last 168 hours No results found for this basename: LIPASE, AMYLASE,  in the last 168 hours No results found for this basename: AMMONIA,  in the last 168 hours CBC:  Recent Labs Lab 03/23/13 1227 03/23/13 1919  WBC 21.8* 20.0*  NEUTROABS 19.3*  --   HGB 8.5* 8.4*  HCT 25.3* 25.2*  MCV 87.2 86.6  PLT 241 223   Cardiac Enzymes:  Recent Labs Lab 03/24/13 0700  TROPONINI <0.30   BNP (last 3 results)  Recent Labs  10/07/12 2126 11/01/12 1446  PROBNP 1631.0* 2441.0*   CBG:  Recent Labs Lab 03/23/13 1707 03/23/13 1832 03/23/13 1943 03/23/13 2047 03/23/13 2202  GLUCAP 251* 224* 186* 155* 121*    Recent Results (from the past 240 hour(s))  WOUND CULTURE     Status: None   Collection Time    03/23/13  1:48 PM      Result Value Ref Range Status   Specimen Description BUTTOCKS RIGHT   Final   Special Requests NONE   Final   Gram Stain PENDING   Incomplete   Culture     Final   Value: FEW GRAM NEGATIVE RODS     [facility]formed at [facility]   Report Status PENDING   Incomplete  MRSA PCR SCREENING     Status: None   Collection Time    03/23/13  6:06 PM       Result Value Ref Range Status   MRSA by PCR NEGATIVE  NEGATIVE Final   Comment:            The GeneXpert MRSA Assay (FDA     approved for NASAL specimens     only), is one component of a     comprehensive MRSA colonization     surveillance program. It is not     intended to diagnose MRSA     infection nor to guide or     monitor treatment for     MRSA infections.     Studies: No results found.  Scheduled Meds: . amLODipine  10 mg Oral Daily  . aspirin EC  81 mg Oral Daily  . atorvastatin  40 mg Oral q1800  . heparin  5,000 Units Subcutaneous 3 times per day  . hydrALAZINE  25 mg Oral TID  . nitroGLYCERIN      . piperacillin-tazobactam (ZOSYN)  IV  3.375 g Intravenous 3 times per day  . vancomycin  1,750 mg Intravenous Q24H   Continuous Infusions: . sodium chloride    . sodium chloride 20 mL/hr (03/24/13 0811)  . dextrose  5 % and 0.45% NaCl 100 mL/hr at 03/24/13 0259  . insulin (NOVOLIN-R) infusion 1.2 Units/hr (03/24/13 0800)    Active Problems:   DKA (diabetic ketoacidoses)   Left buttock abscess   Acute-on-chronic kidney injury   Sepsis   DIABETES MELLITUS, TYPE I   OSA (obstructive sleep apnea)   Anemia, chronic disease   Leukocytosis    Time spent: 35 minutes    Jeralyn Bennett  [facility] Pager [phone]. If 7PM-7AM, please contact night-coverage at [email or web address], password [facility] 03/24/2013, 8:18 AM  LOS: 1 day

## 2013-03-24 NOTE — Progress Notes (Signed)
1st nitroglycerin given at 0628.  Pt's pain level was 5 on a scale of 1 to 10.  W/in 2 minutes, pt stated her pain level was a 3- breathing deeply and evenly but  pt moaning and saying she is scared.  2nd nitro tablet given at [facility]0636

## 2013-03-24 NOTE — Consult Note (Signed)
WOC wound consult note Reason for Consult: Left buttock abscess. I&D in Ed yesterday with packing strip inserted.  Today there is brown, malodorous exudate on old topper dressing. Patient with DM and other comorbid conditions warranting ICU stay. Wound type:Infectious Pressure Ulcer POA: No Measurement:Stab wound measuring .8cm x .5cm with 1cm drop in depth.  5cm depth (tracking) at 10 o'clock.   Wound bed: Unable to visualize.   Drainage (amount, consistency, odor) Light brown malodorous exudate on old dressing. Periwound:Inact with erythema and mild ecchymosis at the 10 o'clock position.  No induration or heat/warmth. Dressing procedure/placement/frequency: I will implement orders for twice daily cleansing (irrigation) of the wound with subsequent filling of the defect using 1/4 inch plain gauze packing strip.  Nursing staff is provided guidance that tracking of wound is located at 10 o'clock so that they can fill defect in that direction.  If induration reappears or if drainage continues to be in large amounts, suggest consultation with [facility] for enlargement of opening. WOC nursing team will not follow, but will remain available to this patient, the nursing and medical teams.  Please re-consult if needed. Thanks, Ladona MowLaurie Kedron Uno, MSN, RN, GNP, [place], CWON-AP [phone])

## 2013-03-24 NOTE — Progress Notes (Signed)
Subjective/Objective Reports chest pain, non-radiating 5 on 1-10 pain scale.  Scheduled Meds: . amLODipine  10 mg Oral Daily  . aspirin EC  81 mg Oral Daily  . atorvastatin  40 mg Oral q1800  . heparin  5,000 Units Subcutaneous 3 times per day  . hydrALAZINE  25 mg Oral TID  . nitroGLYCERIN      . piperacillin-tazobactam (ZOSYN)  IV  3.375 g Intravenous 3 times per day  . vancomycin  1,750 mg Intravenous Q24H   Continuous Infusions: . sodium chloride    . dextrose 5 % and 0.45% NaCl 100 mL/hr at 03/24/13 0259  . insulin (NOVOLIN-R) infusion     PRN Meds:acetaminophen, albuterol, dextrose, HYDROcodone-acetaminophen, morphine injection, nitroGLYCERIN  Vital signs in last 24 hours: Temp:  [98.4 F (36.9 C)-99.7 F (37.6 C)] 98.5 F (36.9 C) (02/28 0400) Pulse Rate:  [74-94] 86 (02/28 0637) Resp:  [16-33] 24 (02/28 0637) BP: (105-151)/(37-104) 114/49 mmHg (02/28 0637) SpO2:  [89 %-100 %] 92 % (02/28 0637) Weight:  [126.6 kg (279 lb 1.6 oz)-128.4 kg (283 lb 1.1 oz)] 128.4 kg (283 lb 1.1 oz) (02/28 0000)  Intake/Output last 3 shifts: I/O last 3 completed shifts: In: 183.5 [I.V.:83.5; IV Piggyback:100] Out: -  Intake/Output this shift: Total I/O In: 700 [I.V.:700] Out: -   Physical Examination: General appearance - alert, oriented appears in mild discomfort Chest - clear to auscultation bilaterally Heart - regular rate and rhythm  EKG:  Vent. rate 85 BPM PR interval 160 ms QRS duration 92 ms QT/QTc 398/473 ms P-R-T axes 75 22 88  Normal sinus rhythm Cannot rule out Anterior infarct , age undetermined Abnormal ECG  Problem Assessment/Plan  Chest pain: EKG, troponin obtained - Ntg given with improvement in pain level to 3. 2nd NTG given.

## 2013-03-25 ENCOUNTER — Inpatient Hospital Stay (HOSPITAL_COMMUNITY): Payer: 59

## 2013-03-25 ENCOUNTER — Encounter (HOSPITAL_COMMUNITY): Payer: Self-pay | Admitting: Surgery

## 2013-03-25 DIAGNOSIS — K612 Anorectal abscess: Secondary | ICD-10-CM

## 2013-03-25 DIAGNOSIS — R8271 Bacteriuria: Secondary | ICD-10-CM | POA: Insufficient documentation

## 2013-03-25 DIAGNOSIS — E1110 Type 2 diabetes mellitus with ketoacidosis without coma: Secondary | ICD-10-CM

## 2013-03-25 DIAGNOSIS — K611 Rectal abscess: Secondary | ICD-10-CM | POA: Diagnosis present

## 2013-03-25 DIAGNOSIS — J9600 Acute respiratory failure, unspecified whether with hypoxia or hypercapnia: Secondary | ICD-10-CM | POA: Diagnosis present

## 2013-03-25 LAB — BASIC METABOLIC PANEL
BUN: 68 mg/dL — ABNORMAL HIGH (ref 6–23)
BUN: 71 mg/dL — ABNORMAL HIGH (ref 6–23)
BUN: 72 mg/dL — ABNORMAL HIGH (ref 6–23)
BUN: 74 mg/dL — AB (ref 6–23)
BUN: 74 mg/dL — ABNORMAL HIGH (ref 6–23)
BUN: 76 mg/dL — ABNORMAL HIGH (ref 6–23)
CALCIUM: 8.6 mg/dL (ref 8.4–10.5)
CHLORIDE: 102 meq/L (ref 96–112)
CO2: 13 mEq/L — ABNORMAL LOW (ref 19–32)
CO2: 14 mEq/L — ABNORMAL LOW (ref 19–32)
CO2: 14 mEq/L — ABNORMAL LOW (ref 19–32)
CO2: 15 mEq/L — ABNORMAL LOW (ref 19–32)
CO2: 16 mEq/L — ABNORMAL LOW (ref 19–32)
CO2: 16 meq/L — AB (ref 19–32)
CREATININE: 2.93 mg/dL — AB (ref 0.50–1.10)
CREATININE: 2.8 mg/dL — AB (ref 0.50–1.10)
Calcium: 8.5 mg/dL (ref 8.4–10.5)
Calcium: 8.7 mg/dL (ref 8.4–10.5)
Calcium: 8.9 mg/dL (ref 8.4–10.5)
Calcium: 8.9 mg/dL (ref 8.4–10.5)
Calcium: 9 mg/dL (ref 8.4–10.5)
Chloride: 102 mEq/L (ref 96–112)
Chloride: 102 mEq/L (ref 96–112)
Chloride: 102 mEq/L (ref 96–112)
Chloride: 103 mEq/L (ref 96–112)
Chloride: 104 mEq/L (ref 96–112)
Creatinine, Ser: 2.73 mg/dL — ABNORMAL HIGH (ref 0.50–1.10)
Creatinine, Ser: 2.85 mg/dL — ABNORMAL HIGH (ref 0.50–1.10)
Creatinine, Ser: 2.9 mg/dL — ABNORMAL HIGH (ref 0.50–1.10)
Creatinine, Ser: 2.97 mg/dL — ABNORMAL HIGH (ref 0.50–1.10)
GFR calc Af Amer: 22 mL/min — ABNORMAL LOW (ref >90)
GFR calc Af Amer: 24 mL/min — ABNORMAL LOW (ref >90)
GFR calc non Af Amer: 18 mL/min — ABNORMAL LOW (ref >90)
GFR calc non Af Amer: 19 mL/min — ABNORMAL LOW (ref >90)
GFR calc non Af Amer: 20 mL/min — ABNORMAL LOW (ref >90)
GFR, EST AFRICAN AMERICAN: 22 mL/min — AB (ref >90)
GFR, EST AFRICAN AMERICAN: 22 mL/min — AB (ref >90)
GFR, EST AFRICAN AMERICAN: 21 mL/min — AB (ref >90)
GFR, EST AFRICAN AMERICAN: 23 mL/min — AB (ref >90)
GFR, EST NON AFRICAN AMERICAN: 19 mL/min — AB (ref >90)
GFR, EST NON AFRICAN AMERICAN: 20 mL/min — AB (ref >90)
GFR, EST NON AFRICAN AMERICAN: 19 mL/min — AB (ref >90)
Glucose, Bld: 157 mg/dL — ABNORMAL HIGH (ref 70–99)
Glucose, Bld: 157 mg/dL — ABNORMAL HIGH (ref 70–99)
Glucose, Bld: 168 mg/dL — ABNORMAL HIGH (ref 70–99)
Glucose, Bld: 182 mg/dL — ABNORMAL HIGH (ref 70–99)
Glucose, Bld: 184 mg/dL — ABNORMAL HIGH (ref 70–99)
Glucose, Bld: 187 mg/dL — ABNORMAL HIGH (ref 70–99)
POTASSIUM: 4 meq/L (ref 3.7–5.3)
Potassium: 3.5 mEq/L — ABNORMAL LOW (ref 3.7–5.3)
Potassium: 3.5 mEq/L — ABNORMAL LOW (ref 3.7–5.3)
Potassium: 3.6 mEq/L — ABNORMAL LOW (ref 3.7–5.3)
Potassium: 3.7 mEq/L (ref 3.7–5.3)
Potassium: 3.9 mEq/L (ref 3.7–5.3)
SODIUM: 138 meq/L (ref 137–147)
SODIUM: 136 meq/L — AB (ref 137–147)
SODIUM: 136 meq/L — AB (ref 137–147)
Sodium: 137 mEq/L (ref 137–147)
Sodium: 139 mEq/L (ref 137–147)
Sodium: 136 mEq/L — ABNORMAL LOW (ref 137–147)

## 2013-03-25 LAB — GLUCOSE, CAPILLARY
GLUCOSE-CAPILLARY: 164 mg/dL — AB (ref 70–99)
GLUCOSE-CAPILLARY: 158 mg/dL — AB (ref 70–99)
GLUCOSE-CAPILLARY: 155 mg/dL — AB (ref 70–99)
GLUCOSE-CAPILLARY: 180 mg/dL — AB (ref 70–99)
GLUCOSE-CAPILLARY: 185 mg/dL — AB (ref 70–99)
GLUCOSE-CAPILLARY: 181 mg/dL — AB (ref 70–99)
GLUCOSE-CAPILLARY: 163 mg/dL — AB (ref 70–99)
GLUCOSE-CAPILLARY: 162 mg/dL — AB (ref 70–99)
Glucose-Capillary: 128 mg/dL — ABNORMAL HIGH (ref 70–99)
Glucose-Capillary: 139 mg/dL — ABNORMAL HIGH (ref 70–99)
Glucose-Capillary: 141 mg/dL — ABNORMAL HIGH (ref 70–99)
Glucose-Capillary: 143 mg/dL — ABNORMAL HIGH (ref 70–99)
Glucose-Capillary: 146 mg/dL — ABNORMAL HIGH (ref 70–99)
Glucose-Capillary: 155 mg/dL — ABNORMAL HIGH (ref 70–99)
Glucose-Capillary: 155 mg/dL — ABNORMAL HIGH (ref 70–99)
Glucose-Capillary: 157 mg/dL — ABNORMAL HIGH (ref 70–99)
Glucose-Capillary: 159 mg/dL — ABNORMAL HIGH (ref 70–99)
Glucose-Capillary: 159 mg/dL — ABNORMAL HIGH (ref 70–99)
Glucose-Capillary: 160 mg/dL — ABNORMAL HIGH (ref 70–99)
Glucose-Capillary: 162 mg/dL — ABNORMAL HIGH (ref 70–99)
Glucose-Capillary: 169 mg/dL — ABNORMAL HIGH (ref 70–99)
Glucose-Capillary: 171 mg/dL — ABNORMAL HIGH (ref 70–99)
Glucose-Capillary: 174 mg/dL — ABNORMAL HIGH (ref 70–99)
Glucose-Capillary: 174 mg/dL — ABNORMAL HIGH (ref 70–99)
Glucose-Capillary: 174 mg/dL — ABNORMAL HIGH (ref 70–99)
Glucose-Capillary: 179 mg/dL — ABNORMAL HIGH (ref 70–99)

## 2013-03-25 LAB — BLOOD GAS, ARTERIAL
Acid-base deficit: 8 mmol/L — ABNORMAL HIGH (ref 0.0–2.0)
Acid-base deficit: 8.4 mmol/L — ABNORMAL HIGH (ref 0.0–2.0)
Bicarbonate: 15.7 mEq/L — ABNORMAL LOW (ref 20.0–24.0)
Bicarbonate: 14.6 mEq/L — ABNORMAL LOW (ref 20.0–24.0)
Drawn by: 31814
FIO2: 1 %
FIO2: 1 %
O2 SAT: 96 %
O2 Saturation: 90.3 %
PATIENT TEMPERATURE: 98.6
PCO2 ART: 26.4 mmHg — AB (ref 35.0–45.0)
PH ART: 7.425 (ref 7.350–7.450)
PO2 ART: 73.5 mmHg — AB (ref 80.0–100.0)
Patient temperature: 98.6
TCO2: 15.1 mmol/L (ref 0–100)
TCO2: 13.9 mmol/L (ref 0–100)
pCO2 arterial: 22.8 mmHg — ABNORMAL LOW (ref 35.0–45.0)
pH, Arterial: 7.392 (ref 7.350–7.450)
pO2, Arterial: 55.6 mmHg — ABNORMAL LOW (ref 80.0–100.0)

## 2013-03-25 LAB — CBC WITH DIFFERENTIAL/PLATELET
Basophils Absolute: 0 10*3/uL (ref 0.0–0.1)
Basophils Relative: 0 % (ref 0–1)
EOS PCT: 1 % (ref 0–5)
Eosinophils Absolute: 0.2 10*3/uL (ref 0.0–0.7)
HCT: 21.8 % — ABNORMAL LOW (ref 36.0–46.0)
Hemoglobin: 7.4 g/dL — ABNORMAL LOW (ref 12.0–15.0)
LYMPHS PCT: 4 % — AB (ref 12–46)
Lymphs Abs: 0.7 10*3/uL (ref 0.7–4.0)
MCH: 29.4 pg (ref 26.0–34.0)
MCHC: 33.9 g/dL (ref 30.0–36.0)
MCV: 86.5 fL (ref 78.0–100.0)
MONO ABS: 0.7 10*3/uL (ref 0.1–1.0)
Monocytes Relative: 4 % (ref 3–12)
NEUTROS PCT: 91 % — AB (ref 43–77)
Neutro Abs: 16.8 10*3/uL — ABNORMAL HIGH (ref 1.7–7.7)
PLATELETS: 211 10*3/uL (ref 150–400)
RBC: 2.52 MIL/uL — AB (ref 3.87–5.11)
RDW: 13.7 % (ref 11.5–15.5)
WBC: 18.4 10*3/uL — AB (ref 4.0–10.5)

## 2013-03-25 LAB — WOUND CULTURE

## 2013-03-25 LAB — KETONES, QUALITATIVE: Acetone, Bld: NEGATIVE

## 2013-03-25 LAB — LACTIC ACID, PLASMA: LACTIC ACID, VENOUS: 1 mmol/L (ref 0.5–2.2)

## 2013-03-25 LAB — PROCALCITONIN: Procalcitonin: 3.25 ng/mL

## 2013-03-25 LAB — KETONES, URINE: KETONES UR: NEGATIVE mg/dL

## 2013-03-25 MED ORDER — LORAZEPAM 2 MG/ML IJ SOLN
1.0000 mg | Freq: Once | INTRAMUSCULAR | Status: AC
  Administered 2013-03-25: 1 mg via INTRAVENOUS

## 2013-03-25 MED ORDER — FUROSEMIDE 10 MG/ML IJ SOLN
120.0000 mg | Freq: Three times a day (TID) | INTRAVENOUS | Status: DC
  Administered 2013-03-25 – 2013-03-26 (×3): 120 mg via INTRAVENOUS
  Filled 2013-03-25 (×4): qty 12

## 2013-03-25 MED ORDER — LEVOFLOXACIN IN D5W 750 MG/150ML IV SOLN
750.0000 mg | INTRAVENOUS | Status: DC
  Administered 2013-03-25 – 2013-03-27 (×2): 750 mg via INTRAVENOUS
  Filled 2013-03-25 (×3): qty 150

## 2013-03-25 MED ORDER — DEXTROSE-NACL 5-0.45 % IV SOLN
INTRAVENOUS | Status: DC
  Filled 2013-03-25: qty 1000

## 2013-03-25 MED ORDER — LORAZEPAM 2 MG/ML IJ SOLN
INTRAMUSCULAR | Status: AC
  Administered 2013-03-25: 1 mg via INTRAVENOUS
  Filled 2013-03-25: qty 1

## 2013-03-25 MED ORDER — FUROSEMIDE 10 MG/ML IJ SOLN
120.0000 mg | Freq: Once | INTRAMUSCULAR | Status: AC
  Administered 2013-03-25: 120 mg via INTRAVENOUS
  Filled 2013-03-25: qty 12

## 2013-03-25 MED ORDER — FUROSEMIDE 10 MG/ML IJ SOLN: 80.0000 mg | Freq: Once | INTRAMUSCULAR | Status: DC

## 2013-03-25 MED ORDER — LORAZEPAM 2 MG/ML IJ SOLN
INTRAMUSCULAR | Status: AC
  Filled 2013-03-25: qty 1

## 2013-03-25 MED ORDER — AMLODIPINE BESYLATE 5 MG PO TABS
5.0000 mg | ORAL_TABLET | Freq: Two times a day (BID) | ORAL | Status: DC
  Administered 2013-03-26 – 2013-03-27 (×4): 5 mg via ORAL
  Filled 2013-03-25 (×8): qty 1

## 2013-03-25 MED ORDER — LORAZEPAM 2 MG/ML IJ SOLN
1.0000 mg | INTRAMUSCULAR | Status: DC | PRN
  Administered 2013-03-25 – 2013-03-26 (×3): 1 mg via INTRAVENOUS
  Filled 2013-03-25 (×3): qty 1

## 2013-03-25 MED ORDER — DEXTROSE 10 % IV SOLN
INTRAVENOUS | Status: DC
  Administered 2013-03-25: 17:00:00 via INTRAVENOUS

## 2013-03-25 NOTE — Progress Notes (Signed)
CXR appears to show interstitial edema Lasix 120 mg given Although RR remains high, she is able to talk in full sentences through bipap & able to slow down RR , s/o anxiety Will give ativan 1 mg IV   Jasmine Reffner V.

## 2013-03-25 NOTE — Consult Note (Signed)
Renal Service Consult Note [facility]  Jasmine [name] 03/25/2013 Jasmine [name] Requesting Physician: Dr Vanessa Barbara  Reason for Consult:  CKD patient with DKA, perirectal abcess and A/C renal failure  HPI: The patient is a 43 y.o. year-old with long hx of DM2, psych illness, HTN, CKD and diastolic CHF.  Last admit here was in Sept 2014 for resp failure, strep PNA, CKD IV and DM.  Patient presented to ED on 2/27 (2 days ago) reporting abcess on her buttock and sent from PCP's office for high BS and high WBC.  Also N/V, malaise, HA prior to presenting.  I&D of abcess was done in ED. Labs showed BS 446, bicarb 18, AG 19 , WBC 21k.  Pt was admitted and started on IV abx and IV insulin drip.  She had CP yest Rx with SL NTG. She was up walking in the room.  Troponins were negative.  She cont'd IV insulin and IVF's.  Creat noted to be up at 2.69, baseline 1.7-2.0.  IV vanc and zosyn were continued.   Yesterday evening pt developed some dyspnea, O2 was icnreased.  BNP was up 6.579, rales were noted and IV lasix was given. UOP yesterday was 1700cc. She received more IV lasix today and UOP so far today is 1240cc.  She was put on bipap just a little while ago and reportedly is looking a lot better. CXR this evening shows pulm vasc congestion, mild pulm edema and bilateral basilar infiltrates.  CT abdomen done today is showing airspace consolidation R > L with air bronchograms suggesting multifocal PNA. Also small bilat effusions and low lung volumes.    Pt feels "much better" after bipap placed.  Denies CP or prod cough. In 2014 the baseline creat was aroudn 1.8 - 2.5   Chart Review 2002 > psych admit major depression 2006 > admit for suicidal ideation 2006 > admit psych, 3 BP meds, Lantus and 6 psych medications 2007 > UTI, DM, proteinuria 2007 > psych admit, bipolarn disease 2008 > drug overdose 2008 > uuncontrolled DM, gastroparesis, UTI/pyelo 2009 > lap chole 2010 > new onset CHF,  HTN,  2011 > DGP, hyperkalemia, AKI, dCHF 2012 > acute on chronic dCHF 2012 > seizures, hyperglycemia 12/2011 > A/C dCHF, CKD IV 09/2012 > resp failure, DM, CKD IV, strep PNA  ROS  none  Past Medical History  Past Medical History  Diagnosis Date  . Diastolic heart failure     echo 11/03/10 EF 55-60%  . Hypertension   . Chronic kidney disease (CKD)     stage II-III  . Hyperlipidemia   . Uncontrolled diabetes mellitus   . Diabetic ketoacidosis     with seizures  . Anemia   . Diabetic gastroparesis   . Obesity   . Coronary artery disease     mild per cath 2010  . 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 UTI (lower urinary tract infection) 01/19/2012  . Pneumonia due to unspecified Streptococcus 10/16/2012  . Acute on chronic diastolic heart failure 01/18/2012  . Acute on chronic renal failure 01/18/2012  . Acute respiratory failure with hypoxia 10/08/2012   Past Surgical History  Past Surgical History  Procedure Laterality Date  . Cholecystectomy    . Carpal tunnel release  2003   Family History  Family History  Problem Relation Age of Onset  . Heart attack Mother   . Dementia Father    Social History  reports that she has never smoked. She has  never used smokeless tobacco. She reports that she drinks alcohol. She reports that she does not use illicit drugs. Allergies  Allergies  Allergen Reactions  . Contrast Media [Iodinated Diagnostic Agents] Nausea And Vomiting  . Paroxetine Nausea And Vomiting  . Oxycodone Itching and Rash   Home medications Prior to Admission medications   Medication Sig Start Date End Date Taking? Authorizing Provider  acetaminophen (TYLENOL) 500 MG tablet Take 500 mg by mouth every 6 (six) hours as needed for mild pain.   Yes Historical Provider, MD  albuterol (PROVENTIL HFA;VENTOLIN HFA) 108 (90 BASE) MCG/ACT inhaler Inhale 2 puffs into the lungs every 6 (six) hours as needed for wheezing. 10/25/12  Yes Amy D Clegg, NP   amLODipine (NORVASC) 10 MG tablet Take 1 tablet (10 mg total) by mouth daily. 11/20/12  Yes Dolores Pattyaniel R [name], MD  aspirin 81 MG tablet Take 81 mg by mouth daily as needed. For pain   Yes Historical Provider, MD  atorvastatin (LIPITOR) 40 MG tablet Take 1 tablet (40 mg total) by mouth daily at 6 PM. 11/20/12  Yes Dolores Pattyaniel R [name], MD  bisoprolol-hydrochlorothiazide [facility](ZIAC) 5-6.25 MG per tablet Take 1 tablet by mouth daily. 11/15/12  Yes Dolores Pattyaniel R [name], MD  ferrous sulfate 325 (65 FE) MG tablet Take 325 mg by mouth 2 (two) times daily.     Yes Historical Provider, MD  FREESTYLE LITE test strip Twice daily before meals. 11/11/10  Yes Historical Provider, MD  hydrALAZINE (APRESOLINE) 25 MG tablet Take 1 tablet (25 mg total) by mouth 3 (three) times daily. 11/20/12  Yes Dolores Pattyaniel R [name], MD  Hydrocodone-Acetaminophen 5-300 MG TABS Take 1 tablet by mouth 3 (three) times daily as needed. For pain 10/30/12  Yes Nyoka CowdenMichael B Wert, MD  Insulin Aspart Prot & Aspart (NOVOLOG MIX 70/30 FLEXPEN) (70-30) 100 UNIT/ML SUPN Inject 30-40 Units into the skin 2 (two) times daily with a meal. Take 40 units in the am & 30 units in the pm   Yes Historical Provider, MD  Lancets (FREESTYLE) lancets  11/11/10  Yes Historical Provider, MD  metolazone (ZAROXOLYN) 2.5 MG tablet Take 1 tablet (2.5 mg total) by mouth as directed. 02/13/13  Yes Dolores Pattyaniel R [name], MD   Liver Function Tests No results found for this basename: AST, ALT, ALKPHOS, BILITOT, PROT, ALBUMIN,  in the last 168 hours No results found for this basename: LIPASE, AMYLASE,  in the last 168 hours CBC  Recent Labs Lab 03/23/13 1227 03/23/13 1919 03/25/13 1005  WBC 21.8* 20.0* 18.4*  NEUTROABS 19.3*  --  16.8*  HGB 8.5* 8.4* 7.4*  HCT 25.3* 25.2* 21.8*  MCV 87.2 86.6 86.5  PLT 241 223 211   Basic Metabolic Panel  Recent Labs Lab 03/24/13 0943 03/24/13 2232 03/25/13 0017 03/25/13 0213 03/25/13 0651 03/25/13 1005 03/25/13 1442  NA 135*  135* 136* 136* 137 139 136*  K 3.7 3.4* 3.6* 4.0 3.9 3.5* 3.7  CL 100 100 102 102 102 104 102  CO2 17* 17* 16* 14* 14* 16* 13*  GLUCOSE 230* 162* 157* 168* 184* 187* 157*  BUN 79* 73* 71* 74* 76* 72* 74*  CREATININE 2.59* 2.66* 2.73* 2.85* 2.93* 2.97* 2.80*  CALCIUM 8.4 8.7 8.5 8.9 8.7 8.6 9.0    Filed Vitals:   03/25/13 1626 03/25/13 1638 03/25/13 1700 03/25/13 1800  BP: 149/60   131/58  Pulse:  107 102 102  Temp:      TempSrc:      Resp:  35  39 31  Height:      Weight:      SpO2:  99% 98% 96%   Exam Obese young adult female , on bipap, in good spirits No rash, cyanosis or gangrene Sclera anicteric, throat clear No jvd Occ bibasilar coarse insp crackles, no wheezing or bronchophony RRR no MRG Abd obese, soft, nt, nd, no ascites GU - foley draining small amts clear urine Minimal if any LE edema, mild R hand edema, lots of adipose tissue bilat LE's and UE's Neuro is nf, ox3  UA > neg protein, 11-20 wbc, many bact, no rbc, neg Hb Serum acetone today - negative Admit wt 126 kg > 131 kg today   Date             pH       pCO2      Serum CO2      Anion Gap  03/23/13        7.39        29           18       19    03/25/13 am     7.39        26           14                     21   03/25/13 pm     7.42         22           13                    21             Assessment: 1 CKD stage IV- creat not far from baseline 2 Diabetic ketoacidosis- persistent AG 3 Bibasilar PNA 4 Pulm edema from vol excess / CKD, hx dCHF 5 Acute resp failure- combination of bilat PNA, mild pulm edema and small lungs from obesity; improving w lasix and bipap  6 Buttock abcess- growing Proteus mirabilis   Plan- continue IV lasix 120 mg q 8 hrs, limit fluids, abx for PNA, bipap per pulm, check beta-hydroxybutyrate (resume IV insulin if +). Stop vanc IV if possible.   [name] MD (pgr) [phone]    [phone] 03/25/2013, 6:42 PM

## 2013-03-25 NOTE — Progress Notes (Signed)
eLink Physician-Brief Progress Note Patient Name: Jasmine [name] DOB: Dec 05, 1970 MRN: [id number]  Date of Service  03/25/2013   HPI/Events of Note   Metabolic acidosis Acute respiratory failure Pulmonary edema +/- pneumonia   eICU Interventions   ABG Lactate, serum ketones PCT Bedside MD to see Lasix 80 x 1 Levaquin added for atypical coverage Continue DKA protocol for now    Intervention Category Major Interventions: Respiratory failure - evaluation and management  Shyasia Funches 03/25/2013, 4:22 PM

## 2013-03-25 NOTE — Progress Notes (Addendum)
[facility] PROGRESS NOTE  Jasmine [name] WUJ:[id number] DOB: [birth year] DOA: 03/23/2013 PCP: Dois DavenportICHTER,KAREN L., MD  Assessment/Plan: 1. Diabetic ketoacidosis. Is possible that underlying infectious process may have precipitated DKA. Most recent bicarbonate level at 16, will continue IV insulin per DKA protocol, follow serial BMPs. She remains in diabetic ketoacidosis which has been present now for at least 48 hours. I am concerned about the possibility of deep tissue abscess/infection. Will continue DKA protocol. 2. Acute hypoxemic respiratory failure, evidence by an oxygen requirement of 5 L. A repeat chest x-ray performed on 03/24/2013 showing the development of pulmonary edema. She was started on IV Lasix 40 mg twice a day. She has a history of congestive heart failure to which she has required intubation in the past. Continue diuretic therapy, monitor ins and outs, close monitoring in the intensive care unit. 3. Left buttock abscess, patient undergoing incision and drainage in the emergency room. Cultures are pending at the time of this dictation, continue vancomycin and Zosyn. Lab work showing white count of 20,000. Given persistent diabetic ketoacidosis, I am concerned with the possibility of a deeper infection for which general surgery was consulted. I appreciate very much the recommendations. Pending CT scan of abdomen and pelvis at the time of this dictation. Continue current antibiotic therapy. 4. Chronic diastolic congestive heart failure. Transthoracic echocardiogram performed on 10/08/2012 showing ejection fraction of 55-60% with grade 2 diastolic dysfunction. She is receiving IV fluid resuscitation given present state of diabetic ketoacidosis. IV fluids administered for volume expansion and dehydration in setting of DKA which likely has been complicated by the development of pulmonary edema. BNP, back elevated at 6479. Starting Lasix 40 mg IV twice a day. Because of complaints of chest  discomfort her troponins were cycled and remained negative x3 sets. 5. Acute on chronic renal failure. Patient's creatinine remaining elevated at at 2.9 on 03/25/2013. Looking back at her records it appears that her baseline creatinine fluctuates between 1.7 to 2.0. She was started on diuretic therapy given the development of acute hypoxemic respiratory failure in setting of pulmonary edema. We'll continue monitoring closely her kidney function. 6. Metabolic acidosis. Patient's bicarbonate at 16. I discussed case with Dr. Arlean HoppingSchertz of nephrology inquired as to the possibility of underlying kidney failure causing metabolic acidosis versus ongoing diabetic ketoacidosis. It's likely that kidney failure is driving metabolic acidosis. Appreciate nephrology's recommendations.  Code Status: Full code Family Communication: Family not present Disposition Plan: Continue monitoring and treatment in the step down unit   Procedures:  Incision and drainage of left buttock abscess on 03/23/2013  Antibiotics:  Vancomycin (started on 03/23/2013)  Zosyn (started on 03/23/2013)  HPI/Subjective: Patient is a pleasant 43 year old female with a past medical history of poorly controlled insulin-dependent diabetes mellitus, nonobstructive coronary artery disease based on cardiac catheterization in 2010, chronic diastolic congestive heart failure, hypertension, stage III chronic kidney disease who was admitted to the medicine service on 03/23/2013 presenting with complaints of elevated blood sugars. She had complained of polydipsia, nausea, vomiting, headache, malaise, weakness over the 24 hours prior to admission. In the emergency room she was found to have a left buttock abscess undergoing incision and drainage. Labs indicating diabetic ketoacidosis as she was started on IV insulin place on the DKA protocol and admitted to the step down unit. She was started on empiric IV antibiotic therapy with Zosyn and vancomycin. On  the evening of 03/23/2013 she complained of chest pain as troponin level was negative. Patient reported feeling quite anxious at the  time symptoms occurred. Her troponin was cycled and remained negative x3 sets. On 03/24/2013 she gradually became dyspneic, having an increased oxygen requirement and by late afternoon wasn't needing 5 L supplemental oxygen to maintain sats in the low 90s. Lab work revealed an elevated BNP of 6470 as chest x-ray showed the development of pulmonary edema. She was given 60 mg of IV Lasix x1 and continued on 40 mg IV twice. Patient having a history of respiratory failure in setting of acute CHF that required intubation. With the administration of Lasix however her creatinine has slowly trended up. Patient's bicarbonate fluctuate between 16 and 17, continued to have an anion gap of 19 on 03/25/2013. Patient's wound from I&D of left buttock abscess assess on 03/25/2013 which appeared worse with increased erythema no purulence was not seen. Given the concern for possible deep infection, but a CT scan of abdomen and pelvis was ordered. I also requested a surgical consultation to assess wound and the possibility of requiring further incision and drainage. Lab work revealing an upward trend in her creatinine as well as persistent metabolic acidosis. I discussed case with Dr. Arlean Hopping of nephrology as it is probable that underlying kidney failure is the driving her metabolic acidosis as he recommended advancing her diet.  Objective: Filed Vitals:   03/25/13 1100  BP: 164/59  Pulse:   Temp:   Resp:     Intake/Output Summary (Last 24 hours) at 03/25/13 1119 Last data filed at 03/25/13 1100  Gross per 24 hour  Intake   2354 ml  Output   1700 ml  Net    654 ml   Filed Weights   03/23/13 1211 03/24/13 0000 03/25/13 0400  Weight: 126.6 kg (279 lb 1.6 oz) 128.4 kg (283 lb 1.1 oz) 131.4 kg (289 lb 11 oz)    Exam:   General:  Patient appears mildly anxious this morning however in  no significant distress. Currently denies chest pain.  Cardiovascular: Regular rate and rhythm normal S1-S2 no murmurs rubs or gallops  Respiratory: Clear to auscultation bilaterally no wheezing rhonchi or rales  Abdomen: Soft nontender nondistended  Musculoskeletal: No edema  Skin: Status post incision and drainage of left buttock abscess, area inspected, wound packed. There appears to be increased erythema and induration over buttock abscess, with localized warmth, change from yesterday's examination. I cannot express purulence from I&D site.  Data Reviewed: Basic Metabolic Panel:  Recent Labs Lab 03/24/13 2232 03/25/13 0017 03/25/13 0213 03/25/13 0651 03/25/13 1005  NA 135* 136* 136* 137 139  K 3.4* 3.6* 4.0 3.9 3.5*  CL 100 102 102 102 104  CO2 17* 16* 14* 14* 16*  GLUCOSE 162* 157* 168* 184* 187*  BUN 73* 71* 74* 76* 72*  CREATININE 2.66* 2.73* 2.85* 2.93* 2.97*  CALCIUM 8.7 8.5 8.9 8.7 8.6   Liver Function Tests: No results found for this basename: AST, ALT, ALKPHOS, BILITOT, PROT, ALBUMIN,  in the last 168 hours No results found for this basename: LIPASE, AMYLASE,  in the last 168 hours No results found for this basename: AMMONIA,  in the last 168 hours CBC:  Recent Labs Lab 03/23/13 1227 03/23/13 1919 03/25/13 1005  WBC 21.8* 20.0* 18.4*  NEUTROABS 19.3*  --  16.8*  HGB 8.5* 8.4* 7.4*  HCT 25.3* 25.2* 21.8*  MCV 87.2 86.6 86.5  PLT 241 223 211   Cardiac Enzymes:  Recent Labs Lab 03/24/13 0700 03/24/13 1200 03/24/13 1826  TROPONINI <0.30 <0.30 <0.30   BNP (last 3  results)  Recent Labs  10/07/12 2126 11/01/12 1446 03/24/13 1200  PROBNP 1631.0* 2441.0* 6479.0*   CBG:  Recent Labs Lab 03/25/13 0601 03/25/13 0653 03/25/13 0749 03/25/13 0853 03/25/13 1053  GLUCAP 169* 174* 180* 185* 163*    Recent Results (from the past 240 hour(s))  CULTURE, BLOOD (ROUTINE X 2)     Status: None   Collection Time    03/23/13 12:55 PM      Result  Value Ref Range Status   Specimen Description BLOOD RIGHT ANTECUBITAL   Final   Special Requests BOTTLES DRAWN AEROBIC AND ANAEROBIC   Final   Culture  Setup Time     Final   Value: 03/23/2013 16:40     Performed at [facility]   Culture     Final   Value:        BLOOD CULTURE RECEIVED NO GROWTH TO DATE CULTURE WILL BE HELD FOR 5 DAYS BEFORE ISSUING A FINAL NEGATIVE REPORT     Performed at [facility]   Report Status PENDING   Incomplete  CULTURE, BLOOD (ROUTINE X 2)     Status: None   Collection Time    03/23/13  1:05 PM      Result Value Ref Range Status   Specimen Description BLOOD RIGHT ARM   Final   Special Requests BOTTLES DRAWN AEROBIC AND ANAEROBIC   Final   Culture  Setup Time     Final   Value: 03/23/2013 16:40     Performed at [facility]   Culture     Final   Value:        BLOOD CULTURE RECEIVED NO GROWTH TO DATE CULTURE WILL BE HELD FOR 5 DAYS BEFORE ISSUING A FINAL NEGATIVE REPORT     Performed at [facility]   Report Status PENDING   Incomplete  WOUND CULTURE     Status: None   Collection Time    03/23/13  1:48 PM      Result Value Ref Range Status   Specimen Description BUTTOCKS RIGHT   Final   Special Requests NONE   Final   Gram Stain     Final   Value: RARE WBC PRESENT,BOTH PMN AND MONONUCLEAR     NO SQUAMOUS EPITHELIAL CELLS SEEN     FEW GRAM POSITIVE COCCI IN PAIRS     FEW GRAM NEGATIVE RODS     FEW GRAM POSITIVE RODS   Culture     Final   Value: FEW PROTEUS MIRABILIS     Performed at [facility]   Report Status 03/25/2013 FINAL   Final   Organism ID, Bacteria PROTEUS MIRABILIS   Final  MRSA PCR SCREENING     Status: None   Collection Time    03/23/13  6:06 PM      Result Value Ref Range Status   MRSA by PCR NEGATIVE  NEGATIVE Final   Comment:            The GeneXpert MRSA Assay (FDA     approved for NASAL specimens     only), is one component of a     comprehensive MRSA colonization      surveillance program. It is not     intended to diagnose MRSA     infection nor to guide or     monitor treatment for     MRSA infections.     Studies: Dg Chest Port 1 View  03/24/2013  CLINICAL DATA:  Acute respiratory distress.  EXAM: PORTABLE CHEST - 1 VIEW  COMPARISON:  10/30/2012  FINDINGS: There are irregularly thickened interstitial markings which predominate centrally. This is consistent with interstitial edema. Hazy basilar opacity may reflect small effusions.  Cardiac silhouette is normal in size. Normal mediastinal and hilar contours.  IMPRESSION: Findings consistent with interstitial pulmonary edema.   Electronically Signed   By: Amie Portland M.D.   On: 03/24/2013 16:37    Scheduled Meds: . amLODipine  10 mg Oral Daily  . aspirin EC  81 mg Oral Daily  . atorvastatin  40 mg Oral q1800  . furosemide  40 mg Intravenous BID  . heparin  5,000 Units Subcutaneous 3 times per day  . hydrALAZINE  25 mg Oral TID  . nitroGLYCERIN  0.5 inch Topical 4 times per day  . piperacillin-tazobactam (ZOSYN)  IV  3.375 g Intravenous 3 times per day  . vancomycin  1,750 mg Intravenous Q24H   Continuous Infusions: . sodium chloride 20 mL/hr (03/24/13 0811)  . dextrose 5 % and 0.45% NaCl 1,000 mL infusion 75 mL/hr at 03/25/13 0410  . insulin (NOVOLIN-R) infusion 4.8 Units/hr (03/25/13 0949)    Principal Problem:   DKA (diabetic ketoacidoses) Active Problems:   Left buttock abscess   Acute-on-chronic kidney injury   Sepsis   DIABETES MELLITUS, TYPE I   DEPRESSION   ALLERGIC RHINITIS   Heart failure, diastolic, chronic   Hypertension   OSA (obstructive sleep apnea)   Anemia, chronic disease   CKD (chronic kidney disease), stage IV   Gastroparesis   Cough   Leukocytosis   Bacteria in urine, possible UTI   Abscess, perirectal s/p I&D 03/23/2013    Time spent: 35 minutes    Jeralyn Bennett  [facility] Pager [phone]. If 7PM-7AM, please contact night-coverage at  [email or web address], password [email or web address] 03/25/2013, 11:19 AM  LOS: 2 days

## 2013-03-25 NOTE — Consult Note (Signed)
Name: Jasmine [name] MRN: [id number] DOB: 1970-08-25    ADMISSION DATE:  03/23/2013 CONSULTATION DATE:  03/25/2013  REFERRING MD :  [facility] PRIMARY SERVICE: triad  CHIEF COMPLAINT:  sob  BRIEF PATIENT DESCRIPTION: 43 y.o with poorly controlled insulin-dependent diabetes mellitus adm 2/27 with CBG 446 , AG 19 , left buttock abscess requiring I& D & WC 21,800. Treated as DKA & sepsis with vanmc/ zosyn. Developed increasing hypoxia on 2/28 progressing to NRB by 3/1, hence PCCM consulted   PMH - non obs CAD cath 2010, Htn , stg 3 CKD  SIGNIFICANT EVENTS / STUDIES:  2/28 venti mask 3/1 NRB 3/1 Ct abdomen >> ASD BLL, small BL effusions, IUD 2/27 Incision and drainage of left buttock abscess   LINES / TUBES:   CULTURES: 2/27 bld >> ng 2/27 wound >> proteus, pan Sens  ANTIBIOTICS: 2/27 vanc >> 2/27 zosyn >>  HISTORY OF PRESENT ILLNESS:  43 y.o with poorly controlled insulin-dependent diabetes mellitus adm 2/27 with CBG 446 , AG 19 , left buttock abscess requiring I& D & WC 21,800. Treated as DKA & sepsis with vanmc/ zosyn. Developed increasing hypoxia on 2/28 progressing to NRB by 3/1, hence PCCM consulted Placed on insulin gtt, creat remained elevated at 2.9  Currently reports dyspnea & severe anxiety, episode of resp failure in 09/2012 with some degree of PTSD  PAST MEDICAL HISTORY :  Past Medical History  Diagnosis Date  . Diastolic heart failure     echo 11/03/10 EF 55-60%  . Hypertension   . Chronic kidney disease (CKD)     stage II-III  . Hyperlipidemia   . Uncontrolled diabetes mellitus   . Diabetic ketoacidosis     with seizures  . Anemia   . Diabetic gastroparesis   . Obesity   . Coronary artery disease     mild per cath 2010  . 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 UTI (lower urinary tract infection) 01/19/2012  . Pneumonia due to unspecified Streptococcus 10/16/2012  . Acute on chronic diastolic heart failure 01/18/2012  . Acute on chronic  renal failure 01/18/2012  . Acute respiratory failure with hypoxia 10/08/2012   Past Surgical History  Procedure Laterality Date  . Cholecystectomy    . Carpal tunnel release  2003   Prior to Admission medications   Medication Sig Start Date End Date Taking? Authorizing Provider  acetaminophen (TYLENOL) 500 MG tablet Take 500 mg by mouth every 6 (six) hours as needed for mild pain.   Yes Historical Provider, MD  albuterol (PROVENTIL HFA;VENTOLIN HFA) 108 (90 BASE) MCG/ACT inhaler Inhale 2 puffs into the lungs every 6 (six) hours as needed for wheezing. 10/25/12  Yes Amy D Clegg, NP  amLODipine (NORVASC) 10 MG tablet Take 1 tablet (10 mg total) by mouth daily. 11/20/12  Yes Dolores Pattyaniel R [name], MD  aspirin 81 MG tablet Take 81 mg by mouth daily as needed. For pain   Yes Historical Provider, MD  atorvastatin (LIPITOR) 40 MG tablet Take 1 tablet (40 mg total) by mouth daily at 6 PM. 11/20/12  Yes Dolores Pattyaniel R [name], MD  bisoprolol-hydrochlorothiazide [facility](ZIAC) 5-6.25 MG per tablet Take 1 tablet by mouth daily. 11/15/12  Yes Dolores Pattyaniel R [name], MD  ferrous sulfate 325 (65 FE) MG tablet Take 325 mg by mouth 2 (two) times daily.     Yes Historical Provider, MD  FREESTYLE LITE test strip Twice daily before meals. 11/11/10  Yes Historical Provider, MD  hydrALAZINE (APRESOLINE) 25  MG tablet Take 1 tablet (25 mg total) by mouth 3 (three) times daily. 11/20/12  Yes Dolores Patty, MD  Hydrocodone-Acetaminophen 5-300 MG TABS Take 1 tablet by mouth 3 (three) times daily as needed. For pain 10/30/12  Yes Nyoka Cowden, MD  Insulin Aspart Prot & Aspart (NOVOLOG MIX 70/30 FLEXPEN) (70-30) 100 UNIT/ML SUPN Inject 30-40 Units into the skin 2 (two) times daily with a meal. Take 40 units in the am & 30 units in the pm   Yes Historical Provider, MD  Lancets (FREESTYLE) lancets  11/11/10  Yes Historical Provider, MD  metolazone (ZAROXOLYN) 2.5 MG tablet Take 1 tablet (2.5 mg total) by mouth as directed. 02/13/13  Yes  Dolores Patty, MD   Allergies  Allergen Reactions  . Contrast Media [Iodinated Diagnostic Agents] Nausea And Vomiting  . Paroxetine Nausea And Vomiting  . Oxycodone Itching and Rash    FAMILY HISTORY:  Family History  Problem Relation Age of Onset  . Heart attack Mother   . Dementia Father    SOCIAL HISTORY:  reports that she has never smoked. She has never used smokeless tobacco. She reports that she drinks alcohol. She reports that she does not use illicit drugs.  REVIEW OF SYSTEMS:  No chest pain C/o dyspnea, no cough Low gr fever noted POs 2L, last 24 h good UO with lasix Uses cpap at home No abd pain, diarhea    SUBJECTIVE:   VITAL SIGNS: Temp:  [98.8 F (37.1 C)-100.6 F (38.1 C)] 98.8 F (37.1 C) (03/01 1400) Pulse Rate:  [74-107] 107 (03/01 1638) Resp:  [15-41] 35 (03/01 1638) BP: (111-164)/(34-75) 149/60 mmHg (03/01 1626) SpO2:  [89 %-100 %] 99 % (03/01 1638) FiO2 (%):  [50 %-60 %] 60 % (03/01 0800) Weight:  [131.4 kg (289 lb 11 oz)] 131.4 kg (289 lb 11 oz) (03/01 0400) HEMODYNAMICS:   VENTILATOR SETTINGS: Vent Mode:  [-]  FiO2 (%):  [50 %-60 %] 60 % INTAKE / OUTPUT: Intake/Output     02/28 0701 - 03/01 0700 03/01 0701 - 03/02 0700   P.O. 190    I.V. (mL/kg) 1272.5 (9.7) 450 (3.4)   IV Piggyback 687.5 75   Total Intake(mL/kg) 2150 (16.4) 525 (4)   Urine (mL/kg/hr) 1700 (0.5) 990 (0.7)   Total Output 1700 990   Net +450 -465          PHYSICAL EXAMINATION: Gen. Pleasant, well-nourished, in mod distress, anxious affect, tachypneic  ENT - no lesions, no post nasal drip Neck: No JVD, no thyromegaly, no carotid bruits Lungs: no use of accessory muscles, no dullness to percussion, decreased BL  without rales or rhonchi  Cardiovascular: Rhythm regular, heart sounds  normal, no murmurs, no peripheral edema Abdomen: soft and non-tender, no hepatosplenomegaly, BS normal. Musculoskeletal: No deformities, no cyanosis or clubbing Neuro:  alert, non  focal Skin:  Warm, no lesions/ rash   LABS:  CBC  Recent Labs Lab 03/23/13 1227 03/23/13 1919 03/25/13 1005  WBC 21.8* 20.0* 18.4*  HGB 8.5* 8.4* 7.4*  HCT 25.3* 25.2* 21.8*  PLT 241 223 211   Coag's  Recent Labs Lab 03/23/13 1919  INR 1.22   BMET  Recent Labs Lab 03/25/13 0651 03/25/13 1005 03/25/13 1442  NA 137 139 136*  K 3.9 3.5* 3.7  CL 102 104 102  CO2 14* 16* 13*  BUN 76* 72* 74*  CREATININE 2.93* 2.97* 2.80*  GLUCOSE 184* 187* 157*   Electrolytes  Recent Labs Lab 03/25/13  1610 03/25/13 1005 03/25/13 1442  CALCIUM 8.7 8.6 9.0   Sepsis Markers  Recent Labs Lab 03/23/13 1317 03/25/13 1610  LATICACIDVEN 2.53* 1.0   ABG  Recent Labs Lab 03/25/13 0548 03/25/13 1605  PHART 7.392 7.425  PCO2ART 26.4* 22.8*  PO2ART 73.5* 55.6*   Liver Enzymes No results found for this basename: AST, ALT, ALKPHOS, BILITOT, ALBUMIN,  in the last 168 hours Cardiac Enzymes  Recent Labs Lab 03/24/13 0700 03/24/13 1200 03/24/13 1826  TROPONINI <0.30 <0.30 <0.30  PROBNP  --  6479.0*  --    Glucose  Recent Labs Lab 03/25/13 0749 03/25/13 0853 03/25/13 0947 03/25/13 1053 03/25/13 1148 03/25/13 1620  GLUCAP 180* 185* 181* 163* 155* 159*    Imaging Ct Abdomen Pelvis Wo Contrast  03/25/2013   CLINICAL DATA:  DKA. Question intra-abdominal infection/pelvic abscess. Leukocytosis. Question of IV contrast allergy.  EXAM: CT ABDOMEN AND PELVIS WITHOUT CONTRAST  TECHNIQUE: Multidetector CT imaging of the abdomen and pelvis was performed following the standard protocol without intravenous contrast.  COMPARISON:  10/19/2012 and 08/05/2009  FINDINGS: Lung bases demonstrate moderate consolidation with air bronchograms over the posterior lower lobes right worse than left likely a pneumonia. Small bilateral pleural effusions are present. Heart is normal in size.  Abdominal images demonstrate evidence of a prior cholecystectomy. There are a few small calcified splenic  granulomas. Liver is within normal. The pancreas and adrenal glands are normal. Kidneys are normal in size without evidence of hydronephrosis or nephrolithiasis. Ureters are within normal. Appendix is normal. There is mild calcified plaque over the abdominal aorta and iliac vessels.  Pelvic images demonstrate a Foley catheter present within a decompressed bladder. An IUD is in adequate position over the uterine fundus. There is evidence of several uterine fibroids unchanged. There are bilateral simple cystic appearing ovaries. No free fluid in the pelvis. No evidence of adenopathy. There is mild degenerative change of the hips.  IMPRESSION: Airspace consolidation over the posterior lower lobes right worse than left with air bronchograms suggesting multifocal pneumonia. Small bilateral pleural effusions.  No acute findings in the abdomen/pelvis.  Fibroid uterus unchanged. Simple cystic bilateral ovaries. IUD in adequate position.   Electronically Signed   By: Elberta Fortis M.D.   On: 03/25/2013 14:37   Dg Chest Port 1 View  03/24/2013   CLINICAL DATA:  Acute respiratory distress.  EXAM: PORTABLE CHEST - 1 VIEW  COMPARISON:  10/30/2012  FINDINGS: There are irregularly thickened interstitial markings which predominate centrally. This is consistent with interstitial edema. Hazy basilar opacity may reflect small effusions.  Cardiac silhouette is normal in size. Normal mediastinal and hilar contours.  IMPRESSION: Findings consistent with interstitial pulmonary edema.   Electronically Signed   By: Amie Portland M.D.   On: 03/24/2013 16:37     CXR: pulm edema pattern  ASSESSMENT / PLAN:  PULMONARY A:Acte resp failure, hypoxic P:   Bipap May need intubation if wob does not decrease  CARDIOVASCULAR A: Diastolic heart failure P:  Lasix 120 IV Ct hydralazine & nitrates  RENAL A:  AKI on CKD , baseline cr 2.0 range AG metab acidosis P:   Lasix 120 IV x 1  GASTROINTESTINAL A:  No issues P:   Npo  for now  HEMATOLOGIC A:  No issues P:    INFECTIOUS A:  Left buttock abscess P:   Ct zosyn/ vanc  ENDOCRINE A:  Presumed DKA, high AG may be due to renal failure   P:   Ct insulin  gtt for now, chk ketones in urine Add D10 while npo  NEUROLOGIC A:  Anxiety  P:   Ativan x 1  TODAY'S SUMMARY: Favor pulm edema with bibasal pneumonia, buttock abscess Unclear if Ag is due to DKA or AKI -ct insulin gtt  I have personally obtained a history, examined the patient, evaluated laboratory and imaging results, formulated the assessment and plan and placed orders. CRITICAL CARE: The patient is critically ill with multiple organ systems failure and requires high complexity decision making for assessment and support, frequent evaluation and titration of therapies, application of advanced monitoring technologies and extensive interpretation of multiple databases. Critical Care Time devoted to patient care services described in this note is 60  minutes.   Oretha Milch  Pulmonary and Critical Care Medicine [facility] Pager: [phone]  03/25/2013, 5:18 PM

## 2013-03-25 NOTE — Consult Note (Signed)
[place], MD, [address] [place], [place] [address] Phone: [phone] FAX: [phone]     Jasmine [name]  09-30-1970 [id number]  CARE TEAM:  PCP: Hayden Rasmussen., MD  Outpatient Care Team: Patient Care Team: Hayden Rasmussen, MD as PCP - General (Family Medicine) Tanda Rockers, MD as Consulting Physician (Pulmonary Disease) Jolaine Artist, MD as Consulting Physician (Cardiology) Donetta Potts, MD as Consulting Physician (Nephrology)  Inpatient Treatment Team: Treatment Team: Attending Provider: Kelvin Cellar, MD; Registered Nurse: Carlyon Prows, RN; Rounding Team: Ian Bushman, MD; Registered Nurse: Dorene Grebe, RN; Registered Nurse: Richardson Landry, RN; Consulting Physician: Nolon Nations, MD  This patient is a 43 y.o.female who presents today for surgical evaluation at the request of Dr Coralyn Pear.   Reason for evaluation: Peristent DKA s/p I&D recent perirectal abscess  Pleasant obese female with poorly controlled diabetes and other health issues.  Began to get polydipsia and polyuria.  Fevers.  Pain on gluteus.  Can emergency room.  Found perirectal abscess.  [facility] emergency room.  Found to be in diabetic ketoacidosis.  Admitted to ICU.  IV insulin drip.  Aggressive IV antibiotics.  Diabetes still not under good control.  Pain is less and draining well from incision, however, primary medicine team concerned of this infection.  Some bacteria in urine but otherwise infectious workup underwhelming.  Requested surgical evaluation.  Patient feels pain is less but is persistent.  Draining.  Struggling with difficulty breathing.  History of heart failure and obstructive sleep apnea.  Chronic kidney disease being managed.  Does not feel any new lesions.  No horrible constipation or diarrhea.  Past Medical History  Diagnosis Date  . Diastolic heart failure     echo 11/03/10 EF 55-60%  .  Hypertension   . Chronic kidney disease (CKD)     stage II-III  . Hyperlipidemia   . Uncontrolled diabetes mellitus   . Diabetic ketoacidosis     with seizures  . Anemia   . Diabetic gastroparesis   . Obesity   . Coronary artery disease     mild per cath 2010  . 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 UTI (lower urinary tract infection) 01/19/2012  . Pneumonia due to unspecified Streptococcus 10/16/2012  . Acute on chronic diastolic heart failure 95/28/4132  . Acute on chronic renal failure 01/18/2012  . Acute respiratory failure with hypoxia 10/08/2012    Past Surgical History  Procedure Laterality Date  . Cholecystectomy    . Carpal tunnel release  2003    History   Social History  . Marital Status: Single    Spouse Name: N/A    Number of Children: N/A  . Years of Education: N/A   Occupational History  . Not on file.   Social History Main Topics  . Smoking status: Never Smoker   . Smokeless tobacco: Never Used  . Alcohol Use: Yes     Comment: occasionally  . Drug Use: No  . Sexual Activity: Yes    Birth Control/ Protection: IUD   Other Topics Concern  . Not on file   Social History Narrative   She lives with her boyfriend in [place].      Family History  Problem Relation Age of Onset  . Heart attack Mother   . Dementia Father     Current Facility-Administered Medications  Medication Dose Route Frequency Provider Last  Rate Last Dose  . 0.9 %  sodium chloride infusion   Intravenous Continuous Kelvin Cellar, MD 20 mL/hr at 03/24/13 0811 20 mL/hr at 03/24/13 0811  . acetaminophen (TYLENOL) tablet 500 mg  500 mg Oral Q6H PRN Kelvin Cellar, MD   500 mg at 03/24/13 2057  . albuterol (PROVENTIL) (2.5 MG/3ML) 0.083% nebulizer solution 3 mL  3 mL Inhalation Q6H PRN Kelvin Cellar, MD      . amLODipine (NORVASC) tablet 10 mg  10 mg Oral Daily Kelvin Cellar, MD   10 mg at 03/24/13 1101  . aspirin EC tablet 81 mg  81 mg Oral Daily Ritta Slot, NP   81 mg at 03/24/13 0810  . atorvastatin (LIPITOR) tablet 40 mg  40 mg Oral q1800 Kelvin Cellar, MD   40 mg at 03/24/13 1709  . dextrose 5 % and 0.45% NaCl 1,000 mL infusion   Intravenous Continuous Kelvin Cellar, MD 75 mL/hr at 03/25/13 0410    . dextrose 50 % solution 25 mL  25 mL Intravenous PRN Kelvin Cellar, MD      . fentaNYL (SUBLIMAZE) injection 25-50 mcg  25-50 mcg Intravenous Q4H PRN Ritta Slot, NP   50 mcg at 03/24/13 2314  . furosemide (LASIX) injection 40 mg  40 mg Intravenous BID Kelvin Cellar, MD   40 mg at 03/24/13 1600  . heparin injection 5,000 Units  5,000 Units Subcutaneous 3 times per day Kelvin Cellar, MD   5,000 Units at 03/25/13 0606  . hydrALAZINE (APRESOLINE) tablet 25 mg  25 mg Oral TID Kelvin Cellar, MD   25 mg at 03/24/13 2239  . HYDROcodone-acetaminophen (NORCO/VICODIN) 5-325 MG per tablet 1 tablet  1 tablet Oral Q6H PRN Kelvin Cellar, MD   1 tablet at 03/24/13 1739  . insulin regular (NOVOLIN R,HUMULIN R) 1 Units/mL in sodium chloride 0.9 % 100 mL infusion   Intravenous Continuous Kelvin Cellar, MD 2.4 mL/hr at 03/25/13 0755 2.4 Units/hr at 03/25/13 0755  . nitroGLYCERIN (NITROGLYN) 2 % ointment 0.5 inch  0.5 inch Topical 4 times per day Kelvin Cellar, MD   0.5 inch at 03/25/13 0606  . nitroGLYCERIN (NITROSTAT) SL tablet 0.4 mg  0.4 mg Sublingual Q5 min PRN Ritta Slot, NP   0.4 mg at 03/24/13 0636  . piperacillin-tazobactam (ZOSYN) IVPB 3.375 g  3.375 g Intravenous 3 times per day Kelvin Cellar, MD   3.375 g at 03/25/13 0606  . vancomycin (VANCOCIN) 1,750 mg in sodium chloride 0.9 % 500 mL IVPB  1,750 mg Intravenous Q24H Kelvin Cellar, MD   1,750 mg at 03/24/13 1951     Allergies  Allergen Reactions  . Contrast Media [Iodinated Diagnostic Agents] Nausea And Vomiting  . Paroxetine Nausea And Vomiting  . Oxycodone Itching and Rash    ROS: Constitutional:  No fevers, chills, sweats.  Weight stable Eyes:  No vision changes, No  discharge HENT:  No sore throats, nasal drainage Lymph: No neck swelling, No bruising easily Pulmonary:  +cough with productive sputum.  Wheezing CV: No orthopnea, PND  Patient walks 10 minutes for about 1/4 miles without difficulty.  No exertional chest/neck/shoulder/arm pain. GI: No personal nor family history of GI/colon cancer, inflammatory bowel disease, irritable bowel syndrome, allergy such as Celiac Sprue, dietary/dairy problems, colitis, ulcers nor gastritis.  No recent sick contacts/gastroenteritis.  No travel outside the country.  No changes in diet. Renal: No UTIs, No hematuria Genital:  No drainage, bleeding, masses Musculoskeletal: No severe joint pain.  Good  ROM major joints Skin:  No sores or lesions.  No rashes Heme/Lymph:  No easy bleeding.  No swollen lymph nodes Neuro: No focal weakness/numbness.  No seizures Psych: No suicidal ideation.  No hallucinations  BP 126/52  Pulse 86  Temp(Src) 100.4 F (38 C) (Axillary)  Resp 15  Ht _0  (1.676 m)  Wt 289 lb 11 oz (131.4 kg)  BMI 46.78 kg/m2  SpO2 98%  LMP 02/26/2013  Physical Exam: General: Pt awake/alert/oriented x4 in mild acute distress Eyes: PERRL, normal EOM. Sclera nonicteric Neuro: CN II-XII intact w/o focal sensory/motor deficits. Lymph: No head/neck/groin lymphadenopathy Psych:  No delerium/psychosis/paranoia HENT: Normocephalic, Mucus membranes moist.  No thrush Neck: Supple, No tracheal deviation Chest: No pain.  Good respiratory excursion. CV:  Pulses intact.  Regular rhythm Abdomen: Soft, Nondistended.  Nontender.  No incarcerated hernias.  GU:  Normal external genitalia.  Left intergluteal cheek with 1 cm incision.  Packed with Nu Gauze.  Thin purulent foul drainage.  No fluctuance.  Mild tenderness.  No induration.  I feel no new lesions in the perineal/perianal region.  Rectal deferred per patient request.  Ext:  SCDs BLE.  No significant edema.  No cyanosis Skin: No petechiae / purpurea.  No  major sores Musculoskeletal: No severe joint pain.  Good ROM major joints   Results:   Labs: Results for orders placed during the hospital encounter of 03/23/13 (from the past 48 hour(s))  CBG MONITORING, ED     Status: Abnormal   Collection Time    03/23/13 12:05 PM      Result Value Ref Range   Glucose-Capillary 446 (*) 70 - 99 mg/dL  CBC WITH DIFFERENTIAL     Status: Abnormal   Collection Time    03/23/13 12:27 PM      Result Value Ref Range   WBC 21.8 (*) 4.0 - 10.5 K/uL   RBC 2.90 (*) 3.87 - 5.11 MIL/uL   Hemoglobin 8.5 (*) 12.0 - 15.0 g/dL   HCT 25.3 (*) 36.0 - 46.0 %   MCV 87.2  78.0 - 100.0 fL   MCH 29.3  26.0 - 34.0 pg   MCHC 33.6  30.0 - 36.0 g/dL   RDW 13.5  11.5 - 15.5 %   Platelets 241  150 - 400 K/uL   Neutrophils Relative % 89 (*) 43 - 77 %   Neutro Abs 19.3 (*) 1.7 - 7.7 K/uL   Lymphocytes Relative 5 (*) 12 - 46 %   Lymphs Abs 1.1  0.7 - 4.0 K/uL   Monocytes Relative 6  3 - 12 %   Monocytes Absolute 1.4 (*) 0.1 - 1.0 K/uL   Eosinophils Relative 0  0 - 5 %   Eosinophils Absolute 0.0  0.0 - 0.7 K/uL   Basophils Relative 0  0 - 1 %   Basophils Absolute 0.0  0.0 - 0.1 K/uL  BASIC METABOLIC PANEL     Status: Abnormal   Collection Time    03/23/13 12:27 PM      Result Value Ref Range   Sodium 130 (*) 137 - 147 mEq/L   Potassium 4.0  3.7 - 5.3 mEq/L   Chloride 93 (*) 96 - 112 mEq/L   CO2 18 (*) 19 - 32 mEq/L   Glucose, Bld 446 (*) 70 - 99 mg/dL   BUN 85 (*) 6 - 23 mg/dL   Creatinine, Ser 2.35 (*) 0.50 - 1.10 mg/dL   Calcium 8.7  8.4 - 10.5 mg/dL  GFR calc non Af Amer 24 (*) >90 mL/min   GFR calc Af Amer 28 (*) >90 mL/min   Comment: (NOTE)     The eGFR has been calculated using the CKD EPI equation.     This calculation has not been validated in all clinical situations.     eGFR's persistently <90 mL/min signify possible Chronic Kidney     Disease.  CULTURE, BLOOD (ROUTINE X 2)     Status: None   Collection Time    03/23/13 12:55 PM      Result Value  Ref Range   Specimen Description BLOOD RIGHT ANTECUBITAL     Special Requests BOTTLES DRAWN AEROBIC AND ANAEROBIC 4ML     Culture  Setup Time       Value: 03/23/2013 16:40     Performed at [facility]   Culture       Value:        BLOOD CULTURE RECEIVED NO GROWTH TO DATE CULTURE WILL BE HELD FOR 5 DAYS BEFORE ISSUING A FINAL NEGATIVE REPORT     Performed at [facility]   Report Status PENDING    BLOOD GAS, VENOUS     Status: Abnormal   Collection Time    03/23/13  1:00 PM      Result Value Ref Range   pH, Ven 7.389 (*) 7.250 - 7.300   pCO2, Ven 29.8 (*) 45.0 - 50.0 mmHg   pO2, Ven 58.9 (*) 30.0 - 45.0 mmHg   Bicarbonate 17.6 (*) 20.0 - 24.0 mEq/L   TCO2 16.7  0 - 100 mmol/L   Acid-base deficit 6.1 (*) 0.0 - 2.0 mmol/L   O2 Saturation 90.9     Patient temperature 98.6     Collection site VENOUS     Drawn by COLLECTED BY LABORATORY     Sample type VENOUS    CULTURE, BLOOD (ROUTINE X 2)     Status: None   Collection Time    03/23/13  1:05 PM      Result Value Ref Range   Specimen Description BLOOD RIGHT ARM     Special Requests BOTTLES DRAWN AEROBIC AND ANAEROBIC 4ML     Culture  Setup Time       Value: 03/23/2013 16:40     Performed at [facility]   Culture       Value:        BLOOD CULTURE RECEIVED NO GROWTH TO DATE CULTURE WILL BE HELD FOR 5 DAYS BEFORE ISSUING A FINAL NEGATIVE REPORT     Performed at [facility]   Report Status PENDING    I-STAT CG4 LACTIC ACID, ED     Status: Abnormal   Collection Time    03/23/13  1:17 PM      Result Value Ref Range   Lactic Acid, Venous 2.53 (*) 0.5 - 2.2 mmol/L  WOUND CULTURE     Status: None   Collection Time    03/23/13  1:48 PM      Result Value Ref Range   Specimen Description BUTTOCKS RIGHT     Special Requests NONE     Gram Stain       Value: RARE WBC PRESENT,BOTH PMN AND MONONUCLEAR     NO SQUAMOUS EPITHELIAL CELLS SEEN     FEW GRAM POSITIVE COCCI IN PAIRS     FEW GRAM NEGATIVE RODS      FEW GRAM POSITIVE RODS   Culture  Value: FEW GRAM NEGATIVE RODS     Performed at [facility]   Report Status PENDING    CBG MONITORING, ED     Status: Abnormal   Collection Time    03/23/13  2:49 PM      Result Value Ref Range   Glucose-Capillary 313 (*) 70 - 99 mg/dL  CBG MONITORING, ED     Status: Abnormal   Collection Time    03/23/13  3:59 PM      Result Value Ref Range   Glucose-Capillary 286 (*) 70 - 99 mg/dL  URINALYSIS, ROUTINE W REFLEX MICROSCOPIC     Status: Abnormal   Collection Time    03/23/13  4:23 PM      Result Value Ref Range   Color, Urine YELLOW  YELLOW   APPearance CLOUDY (*) CLEAR   Specific Gravity, Urine 1.008  1.005 - 1.030   pH 7.0  5.0 - 8.0   Glucose, UA NEGATIVE  NEGATIVE mg/dL   Hgb urine dipstick NEGATIVE  NEGATIVE   Bilirubin Urine NEGATIVE  NEGATIVE   Ketones, ur NEGATIVE  NEGATIVE mg/dL   Protein, ur NEGATIVE  NEGATIVE mg/dL   Urobilinogen, UA 0.2  0.0 - 1.0 mg/dL   Nitrite NEGATIVE  NEGATIVE   Leukocytes, UA LARGE (*) NEGATIVE  URINE MICROSCOPIC-ADD ON     Status: Abnormal   Collection Time    03/23/13  4:23 PM      Result Value Ref Range   Squamous Epithelial / LPF RARE  RARE   WBC, UA 11-20  <3 WBC/hpf   Bacteria, UA MANY (*) RARE  POC URINE PREG, ED     Status: None   Collection Time    03/23/13  4:30 PM      Result Value Ref Range   Preg Test, Ur NEGATIVE  NEGATIVE   Comment:            THE SENSITIVITY OF THIS     METHODOLOGY IS >24 mIU/mL  CBG MONITORING, ED     Status: Abnormal   Collection Time    03/23/13  5:07 PM      Result Value Ref Range   Glucose-Capillary 251 (*) 70 - 99 mg/dL  MRSA PCR SCREENING     Status: None   Collection Time    03/23/13  6:06 PM      Result Value Ref Range   MRSA by PCR NEGATIVE  NEGATIVE   Comment:            The GeneXpert MRSA Assay (FDA     approved for NASAL specimens     only), is one component of a     comprehensive MRSA colonization     surveillance program. It  is not     intended to diagnose MRSA     infection nor to guide or     monitor treatment for     MRSA infections.  GLUCOSE, CAPILLARY     Status: Abnormal   Collection Time    03/23/13  6:32 PM      Result Value Ref Range   Glucose-Capillary 224 (*) 70 - 99 mg/dL  BASIC METABOLIC PANEL     Status: Abnormal   Collection Time    03/23/13  7:19 PM      Result Value Ref Range   Sodium 134 (*) 137 - 147 mEq/L   Potassium 3.6 (*) 3.7 - 5.3 mEq/L   Chloride 99  96 - 112 mEq/L  CO2 18 (*) 19 - 32 mEq/L   Glucose, Bld 219 (*) 70 - 99 mg/dL   BUN 82 (*) 6 - 23 mg/dL   Creatinine, Ser 2.26 (*) 0.50 - 1.10 mg/dL   Calcium 8.7  8.4 - 10.5 mg/dL   GFR calc non Af Amer 26 (*) >90 mL/min   GFR calc Af Amer 30 (*) >90 mL/min   Comment: (NOTE)     The eGFR has been calculated using the CKD EPI equation.     This calculation has not been validated in all clinical situations.     eGFR's persistently <90 mL/min signify possible Chronic Kidney     Disease.  CBC     Status: Abnormal   Collection Time    03/23/13  7:19 PM      Result Value Ref Range   WBC 20.0 (*) 4.0 - 10.5 K/uL   RBC 2.91 (*) 3.87 - 5.11 MIL/uL   Hemoglobin 8.4 (*) 12.0 - 15.0 g/dL   HCT 25.2 (*) 36.0 - 46.0 %   MCV 86.6  78.0 - 100.0 fL   MCH 28.9  26.0 - 34.0 pg   MCHC 33.3  30.0 - 36.0 g/dL   RDW 13.5  11.5 - 15.5 %   Platelets 223  150 - 400 K/uL  HEMOGLOBIN A1C     Status: Abnormal   Collection Time    03/23/13  7:19 PM      Result Value Ref Range   Hemoglobin A1C 9.6 (*) <5.7 %   Comment: (NOTE)                                                                               According to the ADA Clinical Practice Recommendations for 2011, when     HbA1c is used as a screening test:      >=6.5%   Diagnostic of Diabetes Mellitus               (if abnormal result is confirmed)     5.7-6.4%   Increased risk of developing Diabetes Mellitus     References:Diagnosis and Classification of Diabetes Mellitus,Diabetes      NIOE,7035,00(XFGHW 1):S62-S69 and Standards of Medical Care in             Diabetes - 2011,Diabetes Care,2011,34 (Suppl 1):S11-S61.   Mean Plasma Glucose 229 (*) <117 mg/dL   Comment: Performed at [facility]     Status: None   Collection Time    03/23/13  7:19 PM      Result Value Ref Range   Prothrombin Time 15.1  11.6 - 15.2 seconds   INR 1.22  0.00 - 1.49  GLUCOSE, CAPILLARY     Status: Abnormal   Collection Time    03/23/13  7:43 PM      Result Value Ref Range   Glucose-Capillary 186 (*) 70 - 99 mg/dL  GLUCOSE, CAPILLARY     Status: Abnormal   Collection Time    03/23/13  8:47 PM      Result Value Ref Range   Glucose-Capillary 155 (*) 70 - 99 mg/dL  GLUCOSE, CAPILLARY     Status: Abnormal   Collection  Time    03/23/13 10:02 PM      Result Value Ref Range   Glucose-Capillary 121 (*) 70 - 99 mg/dL  BASIC METABOLIC PANEL     Status: Abnormal   Collection Time    03/23/13 10:15 PM      Result Value Ref Range   Sodium 135 (*) 137 - 147 mEq/L   Potassium 4.1  3.7 - 5.3 mEq/L   Chloride 101  96 - 112 mEq/L   CO2 18 (*) 19 - 32 mEq/L   Glucose, Bld 127 (*) 70 - 99 mg/dL   BUN 77 (*) 6 - 23 mg/dL   Creatinine, Ser 2.35 (*) 0.50 - 1.10 mg/dL   Calcium 8.7  8.4 - 10.5 mg/dL   GFR calc non Af Amer 24 (*) >90 mL/min   GFR calc Af Amer 28 (*) >90 mL/min   Comment: (NOTE)     The eGFR has been calculated using the CKD EPI equation.     This calculation has not been validated in all clinical situations.     eGFR's persistently <90 mL/min signify possible Chronic Kidney     Disease.  GLUCOSE, CAPILLARY     Status: Abnormal   Collection Time    03/23/13 11:22 PM      Result Value Ref Range   Glucose-Capillary 108 (*) 70 - 99 mg/dL  BASIC METABOLIC PANEL     Status: Abnormal   Collection Time    03/24/13 12:15 AM      Result Value Ref Range   Sodium 133 (*) 137 - 147 mEq/L   Potassium 3.8  3.7 - 5.3 mEq/L   Chloride 99  96 - 112 mEq/L   CO2 16 (*) 19 -  32 mEq/L   Glucose, Bld 133 (*) 70 - 99 mg/dL   BUN 78 (*) 6 - 23 mg/dL   Creatinine, Ser 2.48 (*) 0.50 - 1.10 mg/dL   Calcium 8.3 (*) 8.4 - 10.5 mg/dL   GFR calc non Af Amer 23 (*) >90 mL/min   GFR calc Af Amer 26 (*) >90 mL/min   Comment: (NOTE)     The eGFR has been calculated using the CKD EPI equation.     This calculation has not been validated in all clinical situations.     eGFR's persistently <90 mL/min signify possible Chronic Kidney     Disease.  GLUCOSE, CAPILLARY     Status: Abnormal   Collection Time    03/24/13 12:28 AM      Result Value Ref Range   Glucose-Capillary 135 (*) 70 - 99 mg/dL  GLUCOSE, CAPILLARY     Status: Abnormal   Collection Time    03/24/13  1:36 AM      Result Value Ref Range   Glucose-Capillary 145 (*) 70 - 99 mg/dL  BASIC METABOLIC PANEL     Status: Abnormal   Collection Time    03/24/13  2:22 AM      Result Value Ref Range   Sodium 134 (*) 137 - 147 mEq/L   Potassium 3.7  3.7 - 5.3 mEq/L   Chloride 101  96 - 112 mEq/L   CO2 16 (*) 19 - 32 mEq/L   Glucose, Bld 166 (*) 70 - 99 mg/dL   BUN 80 (*) 6 - 23 mg/dL   Creatinine, Ser 2.63 (*) 0.50 - 1.10 mg/dL   Calcium 8.2 (*) 8.4 - 10.5 mg/dL   GFR calc non Af Amer 21 (*) >90 mL/min  GFR calc Af Amer 25 (*) >90 mL/min   Comment: (NOTE)     The eGFR has been calculated using the CKD EPI equation.     This calculation has not been validated in all clinical situations.     eGFR's persistently <90 mL/min signify possible Chronic Kidney     Disease.  GLUCOSE, CAPILLARY     Status: Abnormal   Collection Time    03/24/13  2:42 AM      Result Value Ref Range   Glucose-Capillary 163 (*) 70 - 99 mg/dL  GLUCOSE, CAPILLARY     Status: Abnormal   Collection Time    03/24/13  3:47 AM      Result Value Ref Range   Glucose-Capillary 172 (*) 70 - 99 mg/dL  BASIC METABOLIC PANEL     Status: Abnormal   Collection Time    03/24/13  4:05 AM      Result Value Ref Range   Sodium 135 (*) 137 - 147 mEq/L    Potassium 3.9  3.7 - 5.3 mEq/L   Chloride 102  96 - 112 mEq/L   CO2 17 (*) 19 - 32 mEq/L   Glucose, Bld 176 (*) 70 - 99 mg/dL   BUN 78 (*) 6 - 23 mg/dL   Creatinine, Ser 2.67 (*) 0.50 - 1.10 mg/dL   Calcium 8.5  8.4 - 10.5 mg/dL   GFR calc non Af Amer 21 (*) >90 mL/min   GFR calc Af Amer 24 (*) >90 mL/min   Comment: (NOTE)     The eGFR has been calculated using the CKD EPI equation.     This calculation has not been validated in all clinical situations.     eGFR's persistently <90 mL/min signify possible Chronic Kidney     Disease.  GLUCOSE, CAPILLARY     Status: Abnormal   Collection Time    03/24/13  4:51 AM      Result Value Ref Range   Glucose-Capillary 165 (*) 70 - 99 mg/dL  GLUCOSE, CAPILLARY     Status: Abnormal   Collection Time    03/24/13  5:54 AM      Result Value Ref Range   Glucose-Capillary 175 (*) 70 - 99 mg/dL  BASIC METABOLIC PANEL     Status: Abnormal   Collection Time    03/24/13  7:00 AM      Result Value Ref Range   Sodium 135 (*) 137 - 147 mEq/L   Potassium 3.9  3.7 - 5.3 mEq/L   Chloride 102  96 - 112 mEq/L   CO2 16 (*) 19 - 32 mEq/L   Glucose, Bld 211 (*) 70 - 99 mg/dL   BUN 77 (*) 6 - 23 mg/dL   Creatinine, Ser 2.69 (*) 0.50 - 1.10 mg/dL   Calcium 8.5  8.4 - 10.5 mg/dL   GFR calc non Af Amer 21 (*) >90 mL/min   GFR calc Af Amer 24 (*) >90 mL/min   Comment: (NOTE)     The eGFR has been calculated using the CKD EPI equation.     This calculation has not been validated in all clinical situations.     eGFR's persistently <90 mL/min signify possible Chronic Kidney     Disease.  TROPONIN I     Status: None   Collection Time    03/24/13  7:00 AM      Result Value Ref Range   Troponin I <0.30  <0.30 ng/mL   Comment:  Due to the release kinetics of cTnI,     a negative result within the first hours     of the onset of symptoms does not rule out     myocardial infarction with certainty.     If myocardial infarction is still suspected,      repeat the test at appropriate intervals.  GLUCOSE, CAPILLARY     Status: Abnormal   Collection Time    03/24/13  7:00 AM      Result Value Ref Range   Glucose-Capillary 180 (*) 70 - 99 mg/dL  GLUCOSE, CAPILLARY     Status: Abnormal   Collection Time    03/24/13  8:00 AM      Result Value Ref Range   Glucose-Capillary 176 (*) 70 - 99 mg/dL  GLUCOSE, CAPILLARY     Status: Abnormal   Collection Time    03/24/13  9:15 AM      Result Value Ref Range   Glucose-Capillary 214 (*) 70 - 99 mg/dL   Comment 1 Documented in Chart     Comment 2 Notify RN    BASIC METABOLIC PANEL     Status: Abnormal   Collection Time    03/24/13  9:43 AM      Result Value Ref Range   Sodium 135 (*) 137 - 147 mEq/L   Potassium 3.7  3.7 - 5.3 mEq/L   Chloride 100  96 - 112 mEq/L   CO2 17 (*) 19 - 32 mEq/L   Glucose, Bld 230 (*) 70 - 99 mg/dL   BUN 79 (*) 6 - 23 mg/dL   Creatinine, Ser 2.59 (*) 0.50 - 1.10 mg/dL   Calcium 8.4  8.4 - 10.5 mg/dL   GFR calc non Af Amer 22 (*) >90 mL/min   GFR calc Af Amer 25 (*) >90 mL/min   Comment: (NOTE)     The eGFR has been calculated using the CKD EPI equation.     This calculation has not been validated in all clinical situations.     eGFR's persistently <90 mL/min signify possible Chronic Kidney     Disease.  GLUCOSE, CAPILLARY     Status: Abnormal   Collection Time    03/24/13 10:08 AM      Result Value Ref Range   Glucose-Capillary 199 (*) 70 - 99 mg/dL  GLUCOSE, CAPILLARY     Status: Abnormal   Collection Time    03/24/13 11:19 AM      Result Value Ref Range   Glucose-Capillary 185 (*) 70 - 99 mg/dL   Comment 1 Glucose Stabilizer    TROPONIN I     Status: None   Collection Time    03/24/13 12:00 PM      Result Value Ref Range   Troponin I <0.30  <0.30 ng/mL   Comment:            Due to the release kinetics of cTnI,     a negative result within the first hours     of the onset of symptoms does not rule out     myocardial infarction with certainty.      If myocardial infarction is still suspected,     repeat the test at appropriate intervals.  PRO B NATRIURETIC PEPTIDE     Status: Abnormal   Collection Time    03/24/13 12:00 PM      Result Value Ref Range   Pro B Natriuretic peptide (BNP) 6479.0 (*) 0 - 125 pg/mL  GLUCOSE, CAPILLARY     Status: Abnormal   Collection Time    03/24/13 12:15 PM      Result Value Ref Range   Glucose-Capillary 172 (*) 70 - 99 mg/dL   Comment 1 Glucose Stabilizer    GLUCOSE, CAPILLARY     Status: Abnormal   Collection Time    03/24/13  1:21 PM      Result Value Ref Range   Glucose-Capillary 164 (*) 70 - 99 mg/dL   Comment 1 Documented in Chart     Comment 2 Notify RN     Comment 3 Glucose Stabilizer    GLUCOSE, CAPILLARY     Status: Abnormal   Collection Time    03/24/13  2:20 PM      Result Value Ref Range   Glucose-Capillary 134 (*) 70 - 99 mg/dL   Comment 1 Glucose Stabilizer    GLUCOSE, CAPILLARY     Status: Abnormal   Collection Time    03/24/13  3:24 PM      Result Value Ref Range   Glucose-Capillary 143 (*) 70 - 99 mg/dL   Comment 1 Glucose Stabilizer    GLUCOSE, CAPILLARY     Status: Abnormal   Collection Time    03/24/13  4:32 PM      Result Value Ref Range   Glucose-Capillary 170 (*) 70 - 99 mg/dL   Comment 1 Glucose Stabilizer    GLUCOSE, CAPILLARY     Status: Abnormal   Collection Time    03/24/13  5:35 PM      Result Value Ref Range   Glucose-Capillary 166 (*) 70 - 99 mg/dL   Comment 1 Glucose Stabilizer    TROPONIN I     Status: None   Collection Time    03/24/13  6:26 PM      Result Value Ref Range   Troponin I <0.30  <0.30 ng/mL   Comment:            Due to the release kinetics of cTnI,     a negative result within the first hours     of the onset of symptoms does not rule out     myocardial infarction with certainty.     If myocardial infarction is still suspected,     repeat the test at appropriate intervals.  GLUCOSE, CAPILLARY     Status: Abnormal   Collection  Time    03/24/13  6:40 PM      Result Value Ref Range   Glucose-Capillary 159 (*) 70 - 99 mg/dL   Comment 1 Documented in Chart     Comment 2 Notify RN     Comment 3 Glucose Stabilizer    GLUCOSE, CAPILLARY     Status: Abnormal   Collection Time    03/24/13  7:38 PM      Result Value Ref Range   Glucose-Capillary 164 (*) 70 - 99 mg/dL  GLUCOSE, CAPILLARY     Status: Abnormal   Collection Time    03/24/13  8:38 PM      Result Value Ref Range   Glucose-Capillary 157 (*) 70 - 99 mg/dL  GLUCOSE, CAPILLARY     Status: Abnormal   Collection Time    03/24/13  9:41 PM      Result Value Ref Range   Glucose-Capillary 146 (*) 70 - 99 mg/dL  BASIC METABOLIC PANEL     Status: Abnormal   Collection Time    03/24/13 10:32 PM  Result Value Ref Range   Sodium 135 (*) 137 - 147 mEq/L   Potassium 3.4 (*) 3.7 - 5.3 mEq/L   Chloride 100  96 - 112 mEq/L   CO2 17 (*) 19 - 32 mEq/L   Glucose, Bld 162 (*) 70 - 99 mg/dL   BUN 73 (*) 6 - 23 mg/dL   Creatinine, Ser 2.66 (*) 0.50 - 1.10 mg/dL   Calcium 8.7  8.4 - 10.5 mg/dL   GFR calc non Af Amer 21 (*) >90 mL/min   GFR calc Af Amer 24 (*) >90 mL/min   Comment: (NOTE)     The eGFR has been calculated using the CKD EPI equation.     This calculation has not been validated in all clinical situations.     eGFR's persistently <90 mL/min signify possible Chronic Kidney     Disease.  GLUCOSE, CAPILLARY     Status: Abnormal   Collection Time    03/24/13 10:37 PM      Result Value Ref Range   Glucose-Capillary 141 (*) 70 - 99 mg/dL  BASIC METABOLIC PANEL     Status: Abnormal   Collection Time    03/25/13 12:17 AM      Result Value Ref Range   Sodium 136 (*) 137 - 147 mEq/L   Potassium 3.6 (*) 3.7 - 5.3 mEq/L   Chloride 102  96 - 112 mEq/L   CO2 16 (*) 19 - 32 mEq/L   Glucose, Bld 157 (*) 70 - 99 mg/dL   BUN 71 (*) 6 - 23 mg/dL   Creatinine, Ser 2.73 (*) 0.50 - 1.10 mg/dL   Calcium 8.5  8.4 - 10.5 mg/dL   GFR calc non Af Amer 20 (*) >90  mL/min   GFR calc Af Amer 24 (*) >90 mL/min   Comment: (NOTE)     The eGFR has been calculated using the CKD EPI equation.     This calculation has not been validated in all clinical situations.     eGFR's persistently <90 mL/min signify possible Chronic Kidney     Disease.  GLUCOSE, CAPILLARY     Status: Abnormal   Collection Time    03/25/13 12:45 AM      Result Value Ref Range   Glucose-Capillary 159 (*) 70 - 99 mg/dL  BASIC METABOLIC PANEL     Status: Abnormal   Collection Time    03/25/13  2:13 AM      Result Value Ref Range   Sodium 136 (*) 137 - 147 mEq/L   Potassium 4.0  3.7 - 5.3 mEq/L   Chloride 102  96 - 112 mEq/L   CO2 14 (*) 19 - 32 mEq/L   Glucose, Bld 168 (*) 70 - 99 mg/dL   BUN 74 (*) 6 - 23 mg/dL   Creatinine, Ser 2.85 (*) 0.50 - 1.10 mg/dL   Calcium 8.9  8.4 - 10.5 mg/dL   GFR calc non Af Amer 19 (*) >90 mL/min   GFR calc Af Amer 22 (*) >90 mL/min   Comment: (NOTE)     The eGFR has been calculated using the CKD EPI equation.     This calculation has not been validated in all clinical situations.     eGFR's persistently <90 mL/min signify possible Chronic Kidney     Disease.  GLUCOSE, CAPILLARY     Status: Abnormal   Collection Time    03/25/13  3:50 AM      Result Value Ref Range  Glucose-Capillary 171 (*) 70 - 99 mg/dL  GLUCOSE, CAPILLARY     Status: Abnormal   Collection Time    03/25/13  4:56 AM      Result Value Ref Range   Glucose-Capillary 174 (*) 70 - 99 mg/dL  BLOOD GAS, ARTERIAL     Status: Abnormal   Collection Time    03/25/13  5:48 AM      Result Value Ref Range   FIO2 1.00     Delivery systems NON-REBREATHER OXYGEN MASK     pH, Arterial 7.392  7.350 - 7.450   pCO2 arterial 26.4 (*) 35.0 - 45.0 mmHg   pO2, Arterial 73.5 (*) 80.0 - 100.0 mmHg   Bicarbonate 15.7 (*) 20.0 - 24.0 mEq/L   TCO2 15.1  0 - 100 mmol/L   Acid-base deficit 8.0 (*) 0.0 - 2.0 mmol/L   O2 Saturation 96.0     Patient temperature 98.6     Collection site LEFT  RADIAL     Drawn by [id number]     Sample type ARTERIAL     Allens test (pass/fail) PASS  PASS    Imaging / Studies: Dg Chest Port 1 View  03/24/2013   CLINICAL DATA:  Acute respiratory distress.  EXAM: PORTABLE CHEST - 1 VIEW  COMPARISON:  10/30/2012  FINDINGS: There are irregularly thickened interstitial markings which predominate centrally. This is consistent with interstitial edema. Hazy basilar opacity may reflect small effusions.  Cardiac silhouette is normal in size. Normal mediastinal and hilar contours.  IMPRESSION: Findings consistent with interstitial pulmonary edema.   Electronically Signed   By: Lajean Manes M.D.   On: 03/24/2013 16:37    Medications / Allergies: per chart  Antibiotics: Anti-infectives   Start     Dose/Rate Route Frequency Ordered Stop   03/24/13 2000  vancomycin (VANCOCIN) 1,750 mg in sodium chloride 0.9 % 500 mL IVPB     1,750 mg 250 mL/hr over 120 Minutes Intravenous Every 24 hours 03/23/13 1903     03/23/13 2000  piperacillin-tazobactam (ZOSYN) IVPB 3.375 g     3.375 g 12.5 mL/hr over 240 Minutes Intravenous 3 times per day 03/23/13 1903     03/23/13 2000  vancomycin (VANCOCIN) 2,000 mg in sodium chloride 0.9 % 500 mL IVPB     2,000 mg 250 mL/hr over 120 Minutes Intravenous  Once 03/23/13 1903 03/24/13 0005   03/23/13 1345  clindamycin (CLEOCIN) IVPB 600 mg     600 mg 100 mL/hr over 30 Minutes Intravenous  Once 03/23/13 1338 03/23/13 1514      Assessment  Paraskevi L Seymour  43 y.o. female       Problem List:  Principal Problem:   DKA (diabetic ketoacidoses) Active Problems:   DIABETES MELLITUS, TYPE I   DEPRESSION   ALLERGIC RHINITIS   Heart failure, diastolic, chronic   Hypertension   OSA (obstructive sleep apnea)   Anemia, chronic disease   CKD (chronic kidney disease), stage IV   Gastroparesis   Cough   Left buttock abscess   Acute-on-chronic kidney injury   Leukocytosis   Sepsis   Perirectal abscess status post incision and  drainage with persistent DKA and medically complicated/fragile patient  Plan:  Agree with IV vancomycin/Zosyn.  Agree with wound care.  Agree with CT scan of pelvis to rule out deeper missed infection.  NPO for now.  If there is more possible or inflammation, offer examination under anesthesia with operative drainage.  The instinct says it will not come  to that.  However, I think we need to be diligent given her obesity and complexity to hide a deeper space infection cannot be seen on physical exam.  I discussed with that went in detail with the patient should it come to that:  The anatomy and physiology of skin abscesses was discussed. Pathophysiology of SQ abscess, possible progression to fasciitis & sepsis, etc discussed . I stressed good hygiene & wound care.  Possible redebridement was discussed as well.  Possibility of recurrence was discussed. Risks, benefits, alternatives were discussed. I noted a good likelihood this will help address the problem.   Risks of anesthesia and other risks discussed. Questions answered.  The patient is open to surgery if needed.  Continue aggressive diabetic control.  Pulmonary management, nephrology management/renal, other health issues per primary service.  -VTE prophylaxis- SCDs, etc  -mobilize as tolerated to help recovery    Adin Hector, M.D., F.A.C.S. Gastrointestinal and Minimally Invasive Surgery [facility] [address] [place], [place] [address] [phone] Main / Paging   03/25/2013  Note: This dictation was prepared with Dragon/digital dictation along with [facility] technology. Any transcriptional errors that result from this process are unintentional.

## 2013-03-26 DIAGNOSIS — I5032 Chronic diastolic (congestive) heart failure: Secondary | ICD-10-CM

## 2013-03-26 DIAGNOSIS — J189 Pneumonia, unspecified organism: Secondary | ICD-10-CM

## 2013-03-26 LAB — BASIC METABOLIC PANEL
BUN: 69 mg/dL — AB (ref 6–23)
BUN: 71 mg/dL — ABNORMAL HIGH (ref 6–23)
BUN: 76 mg/dL — AB (ref 6–23)
BUN: 82 mg/dL — AB (ref 6–23)
BUN: 82 mg/dL — ABNORMAL HIGH (ref 6–23)
CALCIUM: 9.1 mg/dL (ref 8.4–10.5)
CHLORIDE: 99 meq/L (ref 96–112)
CO2: 16 mEq/L — ABNORMAL LOW (ref 19–32)
CO2: 14 mEq/L — ABNORMAL LOW (ref 19–32)
CO2: 13 mEq/L — ABNORMAL LOW (ref 19–32)
CO2: 13 meq/L — AB (ref 19–32)
CO2: 12 mEq/L — ABNORMAL LOW (ref 19–32)
CREATININE: 3.36 mg/dL — AB (ref 0.50–1.10)
CREATININE: 3.43 mg/dL — AB (ref 0.50–1.10)
Calcium: 8.8 mg/dL (ref 8.4–10.5)
Calcium: 8.7 mg/dL (ref 8.4–10.5)
Calcium: 8.9 mg/dL (ref 8.4–10.5)
Calcium: 9 mg/dL (ref 8.4–10.5)
Chloride: 105 mEq/L (ref 96–112)
Chloride: 101 mEq/L (ref 96–112)
Chloride: 100 mEq/L (ref 96–112)
Chloride: 98 mEq/L (ref 96–112)
Creatinine, Ser: 3.02 mg/dL — ABNORMAL HIGH (ref 0.50–1.10)
Creatinine, Ser: 3.1 mg/dL — ABNORMAL HIGH (ref 0.50–1.10)
Creatinine, Ser: 3.42 mg/dL — ABNORMAL HIGH (ref 0.50–1.10)
GFR calc Af Amer: 20 mL/min — ABNORMAL LOW (ref >90)
GFR calc Af Amer: 18 mL/min — ABNORMAL LOW (ref >90)
GFR calc Af Amer: 18 mL/min — ABNORMAL LOW (ref >90)
GFR calc non Af Amer: 16 mL/min — ABNORMAL LOW (ref >90)
GFR calc non Af Amer: 15 mL/min — ABNORMAL LOW (ref >90)
GFR calc non Af Amer: 15 mL/min — ABNORMAL LOW (ref >90)
GFR, EST AFRICAN AMERICAN: 21 mL/min — AB (ref >90)
GFR, EST AFRICAN AMERICAN: 18 mL/min — AB (ref >90)
GFR, EST NON AFRICAN AMERICAN: 18 mL/min — AB (ref >90)
GFR, EST NON AFRICAN AMERICAN: 17 mL/min — AB (ref >90)
GLUCOSE: 153 mg/dL — AB (ref 70–99)
GLUCOSE: 235 mg/dL — AB (ref 70–99)
GLUCOSE: 264 mg/dL — AB (ref 70–99)
Glucose, Bld: 177 mg/dL — ABNORMAL HIGH (ref 70–99)
Glucose, Bld: 259 mg/dL — ABNORMAL HIGH (ref 70–99)
POTASSIUM: 3.5 meq/L — AB (ref 3.7–5.3)
POTASSIUM: 3.8 meq/L (ref 3.7–5.3)
POTASSIUM: 4.1 meq/L (ref 3.7–5.3)
Potassium: 3.5 mEq/L — ABNORMAL LOW (ref 3.7–5.3)
Potassium: 4 mEq/L (ref 3.7–5.3)
Sodium: 140 mEq/L (ref 137–147)
Sodium: 136 mEq/L — ABNORMAL LOW (ref 137–147)
Sodium: 136 mEq/L — ABNORMAL LOW (ref 137–147)
Sodium: 136 mEq/L — ABNORMAL LOW (ref 137–147)
Sodium: 134 mEq/L — ABNORMAL LOW (ref 137–147)

## 2013-03-26 LAB — CBC
HCT: 19.6 % — ABNORMAL LOW (ref 36.0–46.0)
Hemoglobin: 6.6 g/dL — CL (ref 12.0–15.0)
MCH: 28.9 pg (ref 26.0–34.0)
MCHC: 33.7 g/dL (ref 30.0–36.0)
MCV: 86 fL (ref 78.0–100.0)
PLATELETS: 224 10*3/uL (ref 150–400)
RBC: 2.28 MIL/uL — ABNORMAL LOW (ref 3.87–5.11)
RDW: 14.1 % (ref 11.5–15.5)
WBC: 15.9 10*3/uL — ABNORMAL HIGH (ref 4.0–10.5)

## 2013-03-26 LAB — PROCALCITONIN: PROCALCITONIN: 4.04 ng/mL

## 2013-03-26 LAB — GLUCOSE, CAPILLARY
GLUCOSE-CAPILLARY: 119 mg/dL — AB (ref 70–99)
GLUCOSE-CAPILLARY: 129 mg/dL — AB (ref 70–99)
GLUCOSE-CAPILLARY: 150 mg/dL — AB (ref 70–99)
GLUCOSE-CAPILLARY: 168 mg/dL — AB (ref 70–99)
GLUCOSE-CAPILLARY: 185 mg/dL — AB (ref 70–99)
GLUCOSE-CAPILLARY: 230 mg/dL — AB (ref 70–99)
GLUCOSE-CAPILLARY: 251 mg/dL — AB (ref 70–99)
Glucose-Capillary: 142 mg/dL — ABNORMAL HIGH (ref 70–99)
Glucose-Capillary: 134 mg/dL — ABNORMAL HIGH (ref 70–99)
Glucose-Capillary: 145 mg/dL — ABNORMAL HIGH (ref 70–99)
Glucose-Capillary: 151 mg/dL — ABNORMAL HIGH (ref 70–99)
Glucose-Capillary: 167 mg/dL — ABNORMAL HIGH (ref 70–99)
Glucose-Capillary: 178 mg/dL — ABNORMAL HIGH (ref 70–99)
Glucose-Capillary: 182 mg/dL — ABNORMAL HIGH (ref 70–99)

## 2013-03-26 LAB — PREPARE RBC (CROSSMATCH)

## 2013-03-26 MED ORDER — INSULIN ASPART 100 UNIT/ML ~~LOC~~ SOLN: 0.0000 [IU] | Freq: Three times a day (TID) | SUBCUTANEOUS | Status: DC

## 2013-03-26 MED ORDER — INSULIN ASPART 100 UNIT/ML ~~LOC~~ SOLN
0.0000 [IU] | SUBCUTANEOUS | Status: DC
  Administered 2013-03-26: 5 [IU] via SUBCUTANEOUS
  Administered 2013-03-26: 3 [IU] via SUBCUTANEOUS
  Administered 2013-03-26: 2 [IU] via SUBCUTANEOUS

## 2013-03-26 MED ORDER — FUROSEMIDE 10 MG/ML IJ SOLN
160.0000 mg | Freq: Four times a day (QID) | INTRAVENOUS | Status: DC
  Administered 2013-03-26 – 2013-03-27 (×3): 160 mg via INTRAVENOUS
  Filled 2013-03-26 (×7): qty 16

## 2013-03-26 MED ORDER — POTASSIUM CHLORIDE 10 MEQ/100ML IV SOLN
10.0000 meq | INTRAVENOUS | Status: AC
  Administered 2013-03-26 (×2): 10 meq via INTRAVENOUS
  Filled 2013-03-26 (×2): qty 100

## 2013-03-26 MED ORDER — VANCOMYCIN HCL 10 G IV SOLR
1750.0000 mg | INTRAVENOUS | Status: DC
  Administered 2013-03-27: 1750 mg via INTRAVENOUS
  Filled 2013-03-26 (×2): qty 1750

## 2013-03-26 MED ORDER — MIDAZOLAM HCL 2 MG/2ML IJ SOLN
1.0000 mg | Freq: Once | INTRAMUSCULAR | Status: AC
  Administered 2013-03-26: 1 mg via INTRAVENOUS
  Administered 2013-03-27: 2 mg via INTRAVENOUS
  Filled 2013-03-26: qty 2

## 2013-03-26 MED ORDER — DEXTROSE-NACL 5-0.45 % IV SOLN
INTRAVENOUS | Status: DC
  Administered 2013-03-26: 12:00:00 via INTRAVENOUS

## 2013-03-26 MED ORDER — INSULIN GLARGINE 100 UNIT/ML ~~LOC~~ SOLN
10.0000 [IU] | Freq: Two times a day (BID) | SUBCUTANEOUS | Status: DC
  Administered 2013-03-26 – 2013-03-30 (×7): 10 [IU] via SUBCUTANEOUS
  Filled 2013-03-26 (×11): qty 0.1

## 2013-03-26 NOTE — Progress Notes (Signed)
eLink Physician-Brief Progress Note Patient Name: Yazmen L Weisheit DOB: 1970/12/09 MRN: [id number]  Date of Service  03/26/2013   HPI/Events of Note   Needs further anti anxiety for NIMV  eICU Interventions  Versed x 1   Intervention Category Major Interventions: Respiratory failure - evaluation and management  Nelda BucksFEINSTEIN,Syaire Saber J. 03/26/2013, 10:38 PM

## 2013-03-26 NOTE — Progress Notes (Signed)
Inpatient Diabetes Program Recommendations  AACE/ADA: New Consensus Statement on Inpatient Glycemic Control (2013)  Target Ranges:  Prepandial:   less than 140 mg/dL      Peak postprandial:   less than 180 mg/dL (1-2 hours)      Critically ill patients:  140 - 180 mg/dL   Reason for Visit: Presumed DKA  Diabetes history: Type 1 diabetes per problem list Outpatient Diabetes medications: 70/30 40 units every am and 30 units every pm Current orders for Inpatient glycemic control: GlucoStabilizer insulin drip-- stable for the last 6 hours  Note:  High Anion Gap, but could be due to renal status. Beta-hydroxybutyric acid level ordered.  If patient's acidosis is due to renal status (not DKA) and patient is ready to transition off insulin drip, would suggest the following:  Patient receive either NPH 12 units BID or Lantus 23 units daily (while patient is critically ill)  Continue insulin drip either 1 hour after receiving NPH, or 2 hours after receiving Lantus  Novolog Sensitive Correction scale every 4 hours while NPO.  Once eating, change correction to tid with meals and check CBG's ac & hs  Once eating is established, order Novolog 3 units tid with meals to receive if she eats at least 50% and CBG is at least 80 mg/dl  Transition back to 16/1070/30 prior to discharge.  May need less than home dose depending upon renal status.   Thank you.  Kynedi Profitt S. Elsie [place]outh, RN, CNS, CDE Inpatient Diabetes Program, team pager [phone]

## 2013-03-26 NOTE — Progress Notes (Signed)
Subjective:  No new complaints....says breathing is OK as long as she doesn't lie down Objective Vital signs in last 24 hours: Filed Vitals:   03/26/13 1130 03/26/13 1200 03/26/13 1300 03/26/13 1315  BP:  133/60 141/67 141/75  Pulse:  86 92 93  Temp: 98.5 F (36.9 C)  98.6 F (37 C) 98.6 F (37 C)  TempSrc:   Oral Oral  Resp:  31 31 35  Height:      Weight:      SpO2:  93% 91% 90%   Weight change: -0.8 kg (-1 lb 12.2 oz)  Intake/Output Summary (Last 24 hours) at 03/26/13 1356 Last data filed at 03/26/13 1030  Gross per 24 hour  Intake 1623.69 ml  Output   1365 ml  Net 258.69 ml    Assessment/ Plan: Pt is a 43 y.o. yo female who was admitted on 03/23/2013 with  DKA/buttock abscess and some A on CRF  Assessment/Plan: 1. Renal- A on CRF- nonoliguric- no absolute indications for dialysis but numbers worsening slightly every day 2. HTN/volume- appears overloaded.  Agree with lasix but will increase dosage some in order to assist with the pulmonary edema.  Also please limit ins as able.  3. Anemia- significant- probably not helping her SOB/hypoxia- s/p one unit PRBC  4. DKA- gap is not closed but feel that is due to her renal failure- would stop treating aggressively her DKA and just treat her sugar, trying to minimize IVFs when can    Kaycie Pegues A    Labs: Basic Metabolic Panel:  Recent Labs Lab 03/25/13 1950 03/26/13 0122 03/26/13 0635  NA 138 140 136*  K 3.5* 3.5* 3.5*  CL 103 105 101  CO2 15* 16* 14*  GLUCOSE 182* 177* 153*  BUN 68* 69* 71*  CREATININE 2.90* 3.02* 3.10*  CALCIUM 8.9 8.8 8.7   Liver Function Tests: No results found for this basename: AST, ALT, ALKPHOS, BILITOT, PROT, ALBUMIN,  in the last 168 hours No results found for this basename: LIPASE, AMYLASE,  in the last 168 hours No results found for this basename: AMMONIA,  in the last 168 hours CBC:  Recent Labs Lab 03/23/13 1227 03/23/13 1919 03/25/13 1005 03/26/13 0635  WBC 21.8*  20.0* 18.4* 15.9*  NEUTROABS 19.3*  --  16.8*  --   HGB 8.5* 8.4* 7.4* 6.6*  HCT 25.3* 25.2* 21.8* 19.6*  MCV 87.2 86.6 86.5 86.0  PLT 241 223 211 224   Cardiac Enzymes:  Recent Labs Lab 03/24/13 0700 03/24/13 1200 03/24/13 1826  TROPONINI <0.30 <0.30 <0.30   CBG:  Recent Labs Lab 03/26/13 0740 03/26/13 0849 03/26/13 0954 03/26/13 1105 03/26/13 1238  GLUCAP 151* 150* 167* 178* 182*    Iron Studies: No results found for this basename: IRON, TIBC, TRANSFERRIN, FERRITIN,  in the last 72 hours Studies/Results: Ct Abdomen Pelvis Wo Contrast  03/25/2013   CLINICAL DATA:  DKA. Question intra-abdominal infection/pelvic abscess. Leukocytosis. Question of IV contrast allergy.  EXAM: CT ABDOMEN AND PELVIS WITHOUT CONTRAST  TECHNIQUE: Multidetector CT imaging of the abdomen and pelvis was performed following the standard protocol without intravenous contrast.  COMPARISON:  10/19/2012 and 08/05/2009  FINDINGS: Lung bases demonstrate moderate consolidation with air bronchograms over the posterior lower lobes right worse than left likely a pneumonia. Small bilateral pleural effusions are present. Heart is normal in size.  Abdominal images demonstrate evidence of a prior cholecystectomy. There are a few small calcified splenic granulomas. Liver is within normal. The pancreas and adrenal glands  are normal. Kidneys are normal in size without evidence of hydronephrosis or nephrolithiasis. Ureters are within normal. Appendix is normal. There is mild calcified plaque over the abdominal aorta and iliac vessels.  Pelvic images demonstrate a Foley catheter present within a decompressed bladder. An IUD is in adequate position over the uterine fundus. There is evidence of several uterine fibroids unchanged. There are bilateral simple cystic appearing ovaries. No free fluid in the pelvis. No evidence of adenopathy. There is mild degenerative change of the hips.  IMPRESSION: Airspace consolidation over the  posterior lower lobes right worse than left with air bronchograms suggesting multifocal pneumonia. Small bilateral pleural effusions.  No acute findings in the abdomen/pelvis.  Fibroid uterus unchanged. Simple cystic bilateral ovaries. IUD in adequate position.   Electronically Signed   By: Elberta Fortis M.D.   On: 03/25/2013 14:37   Dg Chest Port 1 View  03/25/2013   CLINICAL DATA:  Shortness breath.  EXAM: PORTABLE CHEST - 1 VIEW  COMPARISON:  03/24/2013 chest x-ray. 03/25/2013 CT abdomen and pelvis.  FINDINGS: The CT detected basilar consolidation (more notable on the right) and small pleural effusions better appreciated on recent abdominal CT.  Pulmonary vascular congestion/mild pulmonary edema is similar to prior exam.  Cardiomegaly.  No gross pneumothorax.  IMPRESSION: The CT detected basilar consolidation (more notable on the right) and small pleural effusions better appreciated on recent abdominal CT.  Pulmonary vascular congestion/mild pulmonary edema is similar to prior exam.  Cardiomegaly.   Electronically Signed   By: Bridgett Larsson M.D.   On: 03/25/2013 17:38   Dg Chest Port 1 View  03/24/2013   CLINICAL DATA:  Acute respiratory distress.  EXAM: PORTABLE CHEST - 1 VIEW  COMPARISON:  10/30/2012  FINDINGS: There are irregularly thickened interstitial markings which predominate centrally. This is consistent with interstitial edema. Hazy basilar opacity may reflect small effusions.  Cardiac silhouette is normal in size. Normal mediastinal and hilar contours.  IMPRESSION: Findings consistent with interstitial pulmonary edema.   Electronically Signed   By: Amie Portland M.D.   On: 03/24/2013 16:37   Medications: Infusions: . sodium chloride 20 mL/hr (03/24/13 0811)  . dextrose 5 % and 0.45% NaCl 50 mL/hr at 03/26/13 1207    Scheduled Medications: . amLODipine  5 mg Oral BID  . aspirin EC  81 mg Oral Daily  . atorvastatin  40 mg Oral q1800  . furosemide  120 mg Intravenous 3 times per day  .  heparin  5,000 Units Subcutaneous 3 times per day  . hydrALAZINE  25 mg Oral TID  . insulin aspart  0-9 Units Subcutaneous 6 times per day  . insulin glargine  10 Units Subcutaneous Q12H  . levofloxacin (LEVAQUIN) IV  750 mg Intravenous Q48H  . nitroGLYCERIN  0.5 inch Topical 4 times per day  . piperacillin-tazobactam (ZOSYN)  IV  3.375 g Intravenous 3 times per day  . potassium chloride  10 mEq Intravenous Q1 Hr x 2  . [START ON 03/27/2013] vancomycin  1,750 mg Intravenous Q48H    have reviewed scheduled and prn medications.  Physical Exam: General: alert, no complaints Heart: RRR Lungs: CBS bilat Abdomen: soft, non tender Extremities: pitting edema    03/26/2013,1:56 PM  LOS: 3 days

## 2013-03-26 NOTE — Progress Notes (Addendum)
Name: Jasmine [name] MRN: [id number] DOB: January 23, 1971    ADMISSION DATE:  03/23/2013 CONSULTATION DATE:  03/26/2013  REFERRING MD :  Triad PRIMARY SERVICE: triad  CHIEF COMPLAINT:  sob  BRIEF PATIENT DESCRIPTION: 43 y.o with poorly controlled insulin-dependent diabetes mellitus adm 2/27 with CBG 446 , AG 19 , left buttock abscess requiring I& D & WC 21,800. Treated as DKA & sepsis with vanmc/ zosyn. Developed increasing hypoxia on 2/28 progressing to NRB by 3/1, hence PCCM consulted   PMH - non obs CAD cath 2010, Htn , stg 3 CKD  SIGNIFICANT EVENTS / STUDIES:  2/28 venti mask 3/1 NRB 3/1 Ct abdomen >> ASD BLL, small BL effusions, IUD 2/27 Incision and drainage of left buttock abscess   LINES / TUBES:   CULTURES: 2/27 bld >> ng 2/27 wound >> proteus, pan Sens  ANTIBIOTICS: 2/27 vanc >>  2/27 zosyn >>  HISTORY OF PRESENT ILLNESS:  43 y.o with poorly controlled insulin-dependent diabetes mellitus adm 2/27 with CBG 446 , AG 19 , left buttock abscess requiring I& D & WC 21,800. Treated as DKA & sepsis with vanmc/ zosyn. Developed increasing hypoxia on 2/28 progressing to NRB by 3/1, hence PCCM consulted Placed on insulin gtt, creat remained elevated at 2.9  Currently reports dyspnea & severe anxiety, episode of resp failure in 09/2012 with some degree of PTSD   SUBJECTIVE:  On 70% NRB sats 92%. Alert and orientated   VITAL SIGNS: Temp:  [98.4 F (36.9 C)-101.6 F (38.7 C)] 98.5 F (36.9 C) (03/02 1030) Pulse Rate:  [85-107] 85 (03/02 1030) Resp:  [23-41] 29 (03/02 1030) BP: (110-164)/(45-76) 110/48 mmHg (03/02 1030) SpO2:  [89 %-99 %] 93 % (03/02 1030) FiO2 (%):  [100 %] 100 % (03/02 1000) Weight:  [130.6 kg (287 lb 14.7 oz)] 130.6 kg (287 lb 14.7 oz) (03/02 0000) HEMODYNAMICS:   VENTILATOR SETTINGS: Vent Mode:  [-]  FiO2 (%):  [100 %] 100 % INTAKE / OUTPUT: Intake/Output     03/01 0701 - 03/02 0700 03/02 0701 - 03/03 0700   P.O.     I.V. (mL/kg) 765.3 (5.9)  20.9 (0.2)   Blood  262.5   IV Piggyback 1050 0   Total Intake(mL/kg) 1815.3 (13.9) 283.4 (2.2)   Urine (mL/kg/hr) 1915 (0.6) 200 (0.4)   Total Output 1915 200   Net -99.7 +83.4          PHYSICAL EXAMINATION: Gen. Pleasant, well-nourished, in mod distress, tachypneic  ENT - no lesions, On nrb Neck: No JVD, no thyromegaly, no carotid bruits Lungs: decreased bs bases Cardiovascular: Rhythm regular, heart sounds  normal, no murmurs, no peripheral edema Abdomen: soft and non-tender,  BS normal. Musculoskeletal: No deformities, no cyanosis or clubbing Neuro:  alert, non focal Skin:  Warm, no lesions/ rash   LABS:  CBC  Recent Labs Lab 03/23/13 1919 03/25/13 1005 03/26/13 0635  WBC 20.0* 18.4* 15.9*  HGB 8.4* 7.4* 6.6*  HCT 25.2* 21.8* 19.6*  PLT 223 211 224   Coag's  Recent Labs Lab 03/23/13 1919  INR 1.22   BMET  Recent Labs Lab 03/25/13 1950 03/26/13 0122 03/26/13 0635  NA 138 140 136*  K 3.5* 3.5* 3.5*  CL 103 105 101  CO2 15* 16* 14*  BUN 68* 69* 71*  CREATININE 2.90* 3.02* 3.10*  GLUCOSE 182* 177* 153*   Electrolytes  Recent Labs Lab 03/25/13 1950 03/26/13 0122 03/26/13 0635  CALCIUM 8.9 8.8 8.7   Sepsis Markers  Recent  Labs Lab 03/23/13 1317 03/25/13 1610 03/25/13 1611 03/26/13 0635  LATICACIDVEN 2.53* 1.0  --   --   PROCALCITON  --   --  3.25 4.04   ABG  Recent Labs Lab 03/25/13 0548 03/25/13 1605  PHART 7.392 7.425  PCO2ART 26.4* 22.8*  PO2ART 73.5* 55.6*   Liver Enzymes No results found for this basename: AST, ALT, ALKPHOS, BILITOT, ALBUMIN,  in the last 168 hours Cardiac Enzymes  Recent Labs Lab 03/24/13 0700 03/24/13 1200 03/24/13 1826  TROPONINI <0.30 <0.30 <0.30  PROBNP  --  6479.0*  --    Glucose  Recent Labs Lab 03/26/13 0419 03/26/13 0522 03/26/13 0634 03/26/13 0740 03/26/13 0849 03/26/13 0954  GLUCAP 129* 134* 145* 151* 150* 167*    Imaging Ct Abdomen Pelvis Wo Contrast  03/25/2013   CLINICAL  DATA:  DKA. Question intra-abdominal infection/pelvic abscess. Leukocytosis. Question of IV contrast allergy.  EXAM: CT ABDOMEN AND PELVIS WITHOUT CONTRAST  TECHNIQUE: Multidetector CT imaging of the abdomen and pelvis was performed following the standard protocol without intravenous contrast.  COMPARISON:  10/19/2012 and 08/05/2009  FINDINGS: Lung bases demonstrate moderate consolidation with air bronchograms over the posterior lower lobes right worse than left likely a pneumonia. Small bilateral pleural effusions are present. Heart is normal in size.  Abdominal images demonstrate evidence of a prior cholecystectomy. There are a few small calcified splenic granulomas. Liver is within normal. The pancreas and adrenal glands are normal. Kidneys are normal in size without evidence of hydronephrosis or nephrolithiasis. Ureters are within normal. Appendix is normal. There is mild calcified plaque over the abdominal aorta and iliac vessels.  Pelvic images demonstrate a Foley catheter present within a decompressed bladder. An IUD is in adequate position over the uterine fundus. There is evidence of several uterine fibroids unchanged. There are bilateral simple cystic appearing ovaries. No free fluid in the pelvis. No evidence of adenopathy. There is mild degenerative change of the hips.  IMPRESSION: Airspace consolidation over the posterior lower lobes right worse than left with air bronchograms suggesting multifocal pneumonia. Small bilateral pleural effusions.  No acute findings in the abdomen/pelvis.  Fibroid uterus unchanged. Simple cystic bilateral ovaries. IUD in adequate position.   Electronically Signed   By: Elberta Fortis M.D.   On: 03/25/2013 14:37   Dg Chest Port 1 View  03/25/2013   CLINICAL DATA:  Shortness breath.  EXAM: PORTABLE CHEST - 1 VIEW  COMPARISON:  03/24/2013 chest x-ray. 03/25/2013 CT abdomen and pelvis.  FINDINGS: The CT detected basilar consolidation (more notable on the right) and small  pleural effusions better appreciated on recent abdominal CT.  Pulmonary vascular congestion/mild pulmonary edema is similar to prior exam.  Cardiomegaly.  No gross pneumothorax.  IMPRESSION: The CT detected basilar consolidation (more notable on the right) and small pleural effusions better appreciated on recent abdominal CT.  Pulmonary vascular congestion/mild pulmonary edema is similar to prior exam.  Cardiomegaly.   Electronically Signed   By: Bridgett Larsson M.D.   On: 03/25/2013 17:38   Dg Chest Port 1 View  03/24/2013   CLINICAL DATA:  Acute respiratory distress.  EXAM: PORTABLE CHEST - 1 VIEW  COMPARISON:  10/30/2012  FINDINGS: There are irregularly thickened interstitial markings which predominate centrally. This is consistent with interstitial edema. Hazy basilar opacity may reflect small effusions.  Cardiac silhouette is normal in size. Normal mediastinal and hilar contours.  IMPRESSION: Findings consistent with interstitial pulmonary edema.   Electronically Signed   By: Renard Hamper.D.  On: 03/24/2013 16:37     CXR: pulm edema pattern  ASSESSMENT / PLAN:  PULMONARY A:Acute resp failure, hypoxic and due to metabolic acidosis P:   Bipap prn Follow CXR Diuresis as she can tolerate May need intubation if wob does not decrease Follow ABG  CARDIOVASCULAR A: Diastolic heart failure P:  Lasix 120 IV. Per renal plans Ct hydralazine & nitrates  RENAL A:  AKI on CKD , baseline cr 2.0 range AG and non-AG metab acidosis, initially may have been due to DKA but suspect that this is now largely due to renal failure P:   Lasix 120 IV q 8h per renal Could consider bicarb for non-AG component acidosis  GASTROINTESTINAL A:  No issues P:   Start diet  HEMATOLOGIC  Recent Labs  03/25/13 1005 03/26/13 0635  HGB 7.4* 6.6*    A:  Anemia P:  Transfuse for Hgb < 7.0  INFECTIOUS A:  Left buttock abscess. Proteus pan ss. BC pending 3/2 P:   Ct zosyn/ vanc for now, d/c vanco if  blood cx remain negative on 3/3  ENDOCRINE A:   DM Presumed DKA (no ketones ), high AG may be due to renal failure  (most likely) P:   D/c insulin gtt and D10. lantus diet  NEUROLOGIC A:  Anxiety  P:   Ativan x 1  35 min CC time  Brett Canales Minor ACNP [facility] Pager [phone] till 3 pm If no answer page [phone] 03/26/2013, 10:48 AM  Levy Pupa, MD, PhD 03/26/2013, 2:43 PM [facility] [phone] or if no answer [phone]

## 2013-03-26 NOTE — Progress Notes (Addendum)
[place], [place], [address] [place], [place] [address] Phone: [phone] FAX: [phone]    Doranne L Gleed [id number] 10-Apr-1970  CARE TEAM:  PCP: Hayden Rasmussen., MD  Outpatient Care Team: Patient Care Team: Hayden Rasmussen, MD as PCP - General (Family Medicine) Tanda Rockers, MD as Consulting Physician (Pulmonary Disease) Jolaine Artist, MD as Consulting Physician (Cardiology) Donetta Potts, MD as Consulting Physician (Nephrology) Thurman Coyer, MD as Referring Physician (Internal Medicine)  Inpatient Treatment Team: Treatment Team: Attending Provider: Kelvin Cellar, MD; Rounding Team: Ian Bushman, MD; Registered Nurse: Dorene Grebe, RN; Consulting Physician: Nolon Nations, MD; Technician: Elenor Legato, NT; Consulting Physician: Sol Blazing, MD; Rounding Team: Md Pccm, MD; Registered Nurse: Heriberto Antigua, RN   Subjective:  Rectal pain minimal.  Draining wound - decreasing  Still struggling with pulmonary breathing issues.  CT scan pelvis negative for undrained fluid collection/abscess  Objective:  Vital signs:  Filed Vitals:   03/26/13 0349 03/26/13 0400 03/26/13 0518 03/26/13 0600  BP:  140/69  137/60  Pulse: 95 94 95 86  Temp:  101.6 F (38.7 C)    TempSrc:  Axillary    Resp: 39 34 35 34  Height:      Weight:      SpO2: 93% 94% 93% 99%    Last BM Date: 03/22/13  Intake/Output   Yesterday:  03/01 0701 - 03/02 0700 In: 1792.8 [I.V.:755.3; IV Piggyback:1037.5] Out: 1915 [Urine:1915] This shift:     Bowel function:  Flatus: y  BM: y  Drain: n/a  Physical Exam:  General: Pt awake/alert/oriented x4 in mild acute distress Eyes: PERRL, normal EOM.  Sclera clear.  No icterus Neuro: CN II-XII intact w/o focal sensory/motor deficits. Lymph: No head/neck/groin lymphadenopathy Psych:  No delerium/psychosis/paranoia HENT: Normocephalic,  Mucus membranes moist.  No thrush.  Wearing CPAP Neck: Supple, No tracheal deviation Chest: No chest wall pain w good excursion.   Some conversational dyspnea CV:  Pulses intact.  Regular rhythm MS: Normal AROM mjr joints.  No obvious deformity Abdomen: Soft.  Nondistended.  No evidence of peritonitis.  No incarcerated hernias. GU: Normal external genetalia.  Rectal wounds stable.  Minimal purulent.  No cellulitis or induration. Ext:  SCDs BLE.  No mjr edema.  No cyanosis Skin: No petechiae / purpura   Problem List:   Principal Problem:   DKA (diabetic ketoacidoses) Active Problems:   DIABETES MELLITUS, TYPE I   DEPRESSION   ALLERGIC RHINITIS   Heart failure, diastolic, chronic   Hypertension   OSA (obstructive sleep apnea)   Anemia, chronic disease   CKD (chronic kidney disease), stage IV   Gastroparesis   Cough   Left buttock abscess   Acute-on-chronic kidney injury   Leukocytosis   Sepsis   Bacteria in urine, possible UTI   Abscess, perirectal s/p I&D 03/23/2013   Acute respiratory failure   Assessment  Jasmine [name]  43 y.o. female       Perirectal abscess improved after drainage and antibiotics.  Still struggling but most likely due to pulmonary issues.  Plan:  Continued dressing care with Nu Gauze wick dressing changes once a day.  Continue IV antibiotics.  Recommend at least 7 day course total.  Bowel regimen.  Okay to advance to solid diet from surgery standpoint  Pulmonary, urinary, other health issues per primary service.  We will followup later in the week  if the patient is still here.  The patient is stable.  There is no evidence of peritonitis, acute abdomen, nor shock.  There is no strong evidence of failure of improvement nor decline with current management.  There is no need for surgery at the present moment.    -VTE prophylaxis- SCDs, etc  -mobilize as tolerated to help recovery  Adin Hector, M.D., F.A.C.S. Gastrointestinal and  Minimally Invasive Surgery [facility] [address] [place], [place] [address] [phone] Main / Paging   03/26/2013   Results:   Labs: Results for orders placed during the hospital encounter of 03/23/13 (from the past 48 hour(s))  GLUCOSE, CAPILLARY     Status: Abnormal   Collection Time    03/24/13  8:00 AM      Result Value Ref Range   Glucose-Capillary 176 (*) 70 - 99 mg/dL  GLUCOSE, CAPILLARY     Status: Abnormal   Collection Time    03/24/13  9:15 AM      Result Value Ref Range   Glucose-Capillary 214 (*) 70 - 99 mg/dL   Comment 1 Documented in Chart     Comment 2 Notify RN    BASIC METABOLIC PANEL     Status: Abnormal   Collection Time    03/24/13  9:43 AM      Result Value Ref Range   Sodium 135 (*) 137 - 147 mEq/L   Potassium 3.7  3.7 - 5.3 mEq/L   Chloride 100  96 - 112 mEq/L   CO2 17 (*) 19 - 32 mEq/L   Glucose, Bld 230 (*) 70 - 99 mg/dL   BUN 79 (*) 6 - 23 mg/dL   Creatinine, Ser 2.59 (*) 0.50 - 1.10 mg/dL   Calcium 8.4  8.4 - 10.5 mg/dL   GFR calc non Af Amer 22 (*) >90 mL/min   GFR calc Af Amer 25 (*) >90 mL/min   Comment: (NOTE)     The eGFR has been calculated using the CKD EPI equation.     This calculation has not been validated in all clinical situations.     eGFR's persistently <90 mL/min signify possible Chronic Kidney     Disease.  GLUCOSE, CAPILLARY     Status: Abnormal   Collection Time    03/24/13 10:08 AM      Result Value Ref Range   Glucose-Capillary 199 (*) 70 - 99 mg/dL  GLUCOSE, CAPILLARY     Status: Abnormal   Collection Time    03/24/13 11:19 AM      Result Value Ref Range   Glucose-Capillary 185 (*) 70 - 99 mg/dL   Comment 1 Glucose Stabilizer    TROPONIN I     Status: None   Collection Time    03/24/13 12:00 PM      Result Value Ref Range   Troponin I <0.30  <0.30 ng/mL   Comment:            Due to the release kinetics of cTnI,     a negative result within the first hours     of the  onset of symptoms does not rule out     myocardial infarction with certainty.     If myocardial infarction is still suspected,     repeat the test at appropriate intervals.  PRO B NATRIURETIC PEPTIDE     Status: Abnormal   Collection Time    03/24/13 12:00 PM  Result Value Ref Range   Pro B Natriuretic peptide (BNP) 6479.0 (*) 0 - 125 pg/mL  GLUCOSE, CAPILLARY     Status: Abnormal   Collection Time    03/24/13 12:15 PM      Result Value Ref Range   Glucose-Capillary 172 (*) 70 - 99 mg/dL   Comment 1 Glucose Stabilizer    GLUCOSE, CAPILLARY     Status: Abnormal   Collection Time    03/24/13  1:21 PM      Result Value Ref Range   Glucose-Capillary 164 (*) 70 - 99 mg/dL   Comment 1 Documented in Chart     Comment 2 Notify RN     Comment 3 Glucose Stabilizer    GLUCOSE, CAPILLARY     Status: Abnormal   Collection Time    03/24/13  2:20 PM      Result Value Ref Range   Glucose-Capillary 134 (*) 70 - 99 mg/dL   Comment 1 Glucose Stabilizer    GLUCOSE, CAPILLARY     Status: Abnormal   Collection Time    03/24/13  3:24 PM      Result Value Ref Range   Glucose-Capillary 143 (*) 70 - 99 mg/dL   Comment 1 Glucose Stabilizer    GLUCOSE, CAPILLARY     Status: Abnormal   Collection Time    03/24/13  4:32 PM      Result Value Ref Range   Glucose-Capillary 170 (*) 70 - 99 mg/dL   Comment 1 Glucose Stabilizer    GLUCOSE, CAPILLARY     Status: Abnormal   Collection Time    03/24/13  5:35 PM      Result Value Ref Range   Glucose-Capillary 166 (*) 70 - 99 mg/dL   Comment 1 Glucose Stabilizer    TROPONIN I     Status: None   Collection Time    03/24/13  6:26 PM      Result Value Ref Range   Troponin I <0.30  <0.30 ng/mL   Comment:            Due to the release kinetics of cTnI,     a negative result within the first hours     of the onset of symptoms does not rule out     myocardial infarction with certainty.     If myocardial infarction is still suspected,     repeat the  test at appropriate intervals.  GLUCOSE, CAPILLARY     Status: Abnormal   Collection Time    03/24/13  6:40 PM      Result Value Ref Range   Glucose-Capillary 159 (*) 70 - 99 mg/dL   Comment 1 Documented in Chart     Comment 2 Notify RN     Comment 3 Glucose Stabilizer    GLUCOSE, CAPILLARY     Status: Abnormal   Collection Time    03/24/13  7:38 PM      Result Value Ref Range   Glucose-Capillary 164 (*) 70 - 99 mg/dL  GLUCOSE, CAPILLARY     Status: Abnormal   Collection Time    03/24/13  8:38 PM      Result Value Ref Range   Glucose-Capillary 157 (*) 70 - 99 mg/dL  GLUCOSE, CAPILLARY     Status: Abnormal   Collection Time    03/24/13  9:41 PM      Result Value Ref Range   Glucose-Capillary 146 (*) 70 - 99 mg/dL  BASIC METABOLIC  PANEL     Status: Abnormal   Collection Time    03/24/13 10:32 PM      Result Value Ref Range   Sodium 135 (*) 137 - 147 mEq/L   Potassium 3.4 (*) 3.7 - 5.3 mEq/L   Chloride 100  96 - 112 mEq/L   CO2 17 (*) 19 - 32 mEq/L   Glucose, Bld 162 (*) 70 - 99 mg/dL   BUN 73 (*) 6 - 23 mg/dL   Creatinine, Ser 8.67 (*) 0.50 - 1.10 mg/dL   Calcium 8.7  8.4 - 29.2 mg/dL   GFR calc non Af Amer 21 (*) >90 mL/min   GFR calc Af Amer 24 (*) >90 mL/min   Comment: (NOTE)     The eGFR has been calculated using the CKD EPI equation.     This calculation has not been validated in all clinical situations.     eGFR's persistently <90 mL/min signify possible Chronic Kidney     Disease.  GLUCOSE, CAPILLARY     Status: Abnormal   Collection Time    03/24/13 10:37 PM      Result Value Ref Range   Glucose-Capillary 141 (*) 70 - 99 mg/dL  GLUCOSE, CAPILLARY     Status: Abnormal   Collection Time    03/24/13 11:42 PM      Result Value Ref Range   Glucose-Capillary 139 (*) 70 - 99 mg/dL  BASIC METABOLIC PANEL     Status: Abnormal   Collection Time    03/25/13 12:17 AM      Result Value Ref Range   Sodium 136 (*) 137 - 147 mEq/L   Potassium 3.6 (*) 3.7 - 5.3 mEq/L    Chloride 102  96 - 112 mEq/L   CO2 16 (*) 19 - 32 mEq/L   Glucose, Bld 157 (*) 70 - 99 mg/dL   BUN 71 (*) 6 - 23 mg/dL   Creatinine, Ser 6.72 (*) 0.50 - 1.10 mg/dL   Calcium 8.5  8.4 - 82.0 mg/dL   GFR calc non Af Amer 20 (*) >90 mL/min   GFR calc Af Amer 24 (*) >90 mL/min   Comment: (NOTE)     The eGFR has been calculated using the CKD EPI equation.     This calculation has not been validated in all clinical situations.     eGFR's persistently <90 mL/min signify possible Chronic Kidney     Disease.  GLUCOSE, CAPILLARY     Status: Abnormal   Collection Time    03/25/13 12:45 AM      Result Value Ref Range   Glucose-Capillary 159 (*) 70 - 99 mg/dL  GLUCOSE, CAPILLARY     Status: Abnormal   Collection Time    03/25/13  1:45 AM      Result Value Ref Range   Glucose-Capillary 158 (*) 70 - 99 mg/dL  BASIC METABOLIC PANEL     Status: Abnormal   Collection Time    03/25/13  2:13 AM      Result Value Ref Range   Sodium 136 (*) 137 - 147 mEq/L   Potassium 4.0  3.7 - 5.3 mEq/L   Chloride 102  96 - 112 mEq/L   CO2 14 (*) 19 - 32 mEq/L   Glucose, Bld 168 (*) 70 - 99 mg/dL   BUN 74 (*) 6 - 23 mg/dL   Creatinine, Ser 5.41 (*) 0.50 - 1.10 mg/dL   Calcium 8.9  8.4 - 09.2 mg/dL   GFR  calc non Af Amer 19 (*) >90 mL/min   GFR calc Af Amer 22 (*) >90 mL/min   Comment: (NOTE)     The eGFR has been calculated using the CKD EPI equation.     This calculation has not been validated in all clinical situations.     eGFR's persistently <90 mL/min signify possible Chronic Kidney     Disease.  GLUCOSE, CAPILLARY     Status: Abnormal   Collection Time    03/25/13  2:47 AM      Result Value Ref Range   Glucose-Capillary 155 (*) 70 - 99 mg/dL  GLUCOSE, CAPILLARY     Status: Abnormal   Collection Time    03/25/13  3:50 AM      Result Value Ref Range   Glucose-Capillary 171 (*) 70 - 99 mg/dL  GLUCOSE, CAPILLARY     Status: Abnormal   Collection Time    03/25/13  4:56 AM      Result Value Ref  Range   Glucose-Capillary 174 (*) 70 - 99 mg/dL  BLOOD GAS, ARTERIAL     Status: Abnormal   Collection Time    03/25/13  5:48 AM      Result Value Ref Range   FIO2 1.00     Delivery systems NON-REBREATHER OXYGEN MASK     pH, Arterial 7.392  7.350 - 7.450   pCO2 arterial 26.4 (*) 35.0 - 45.0 mmHg   pO2, Arterial 73.5 (*) 80.0 - 100.0 mmHg   Bicarbonate 15.7 (*) 20.0 - 24.0 mEq/L   TCO2 15.1  0 - 100 mmol/L   Acid-base deficit 8.0 (*) 0.0 - 2.0 mmol/L   O2 Saturation 96.0     Patient temperature 98.6     Collection site LEFT RADIAL     Drawn by [phone]     Sample type ARTERIAL     Allens test (pass/fail) PASS  PASS  GLUCOSE, CAPILLARY     Status: Abnormal   Collection Time    03/25/13  6:01 AM      Result Value Ref Range   Glucose-Capillary 169 (*) 70 - 99 mg/dL  BASIC METABOLIC PANEL     Status: Abnormal   Collection Time    03/25/13  6:51 AM      Result Value Ref Range   Sodium 137  137 - 147 mEq/L   Potassium 3.9  3.7 - 5.3 mEq/L   Chloride 102  96 - 112 mEq/L   CO2 14 (*) 19 - 32 mEq/L   Glucose, Bld 184 (*) 70 - 99 mg/dL   BUN 76 (*) 6 - 23 mg/dL   Creatinine, Ser 2.93 (*) 0.50 - 1.10 mg/dL   Calcium 8.7  8.4 - 10.5 mg/dL   GFR calc non Af Amer 19 (*) >90 mL/min   GFR calc Af Amer 22 (*) >90 mL/min   Comment: (NOTE)     The eGFR has been calculated using the CKD EPI equation.     This calculation has not been validated in all clinical situations.     eGFR's persistently <90 mL/min signify possible Chronic Kidney     Disease.  GLUCOSE, CAPILLARY     Status: Abnormal   Collection Time    03/25/13  6:53 AM      Result Value Ref Range   Glucose-Capillary 174 (*) 70 - 99 mg/dL  GLUCOSE, CAPILLARY     Status: Abnormal   Collection Time    03/25/13  7:49 AM  Result Value Ref Range   Glucose-Capillary 180 (*) 70 - 99 mg/dL   Comment 1 Documented in Chart     Comment 2 Notify RN    GLUCOSE, CAPILLARY     Status: Abnormal   Collection Time    03/25/13  8:53 AM       Result Value Ref Range   Glucose-Capillary 185 (*) 70 - 99 mg/dL   Comment 1 Documented in Chart     Comment 2 Notify RN    GLUCOSE, CAPILLARY     Status: Abnormal   Collection Time    03/25/13  9:47 AM      Result Value Ref Range   Glucose-Capillary 181 (*) 70 - 99 mg/dL  BASIC METABOLIC PANEL     Status: Abnormal   Collection Time    03/25/13 10:05 AM      Result Value Ref Range   Sodium 139  137 - 147 mEq/L   Potassium 3.5 (*) 3.7 - 5.3 mEq/L   Chloride 104  96 - 112 mEq/L   CO2 16 (*) 19 - 32 mEq/L   Glucose, Bld 187 (*) 70 - 99 mg/dL   BUN 72 (*) 6 - 23 mg/dL   Creatinine, Ser 2.97 (*) 0.50 - 1.10 mg/dL   Calcium 8.6  8.4 - 10.5 mg/dL   GFR calc non Af Amer 18 (*) >90 mL/min   GFR calc Af Amer 21 (*) >90 mL/min   Comment: (NOTE)     The eGFR has been calculated using the CKD EPI equation.     This calculation has not been validated in all clinical situations.     eGFR's persistently <90 mL/min signify possible Chronic Kidney     Disease.  CBC WITH DIFFERENTIAL     Status: Abnormal   Collection Time    03/25/13 10:05 AM      Result Value Ref Range   WBC 18.4 (*) 4.0 - 10.5 K/uL   RBC 2.52 (*) 3.87 - 5.11 MIL/uL   Hemoglobin 7.4 (*) 12.0 - 15.0 g/dL   HCT 21.8 (*) 36.0 - 46.0 %   MCV 86.5  78.0 - 100.0 fL   MCH 29.4  26.0 - 34.0 pg   MCHC 33.9  30.0 - 36.0 g/dL   RDW 13.7  11.5 - 15.5 %   Platelets 211  150 - 400 K/uL   Neutrophils Relative % 91 (*) 43 - 77 %   Lymphocytes Relative 4 (*) 12 - 46 %   Monocytes Relative 4  3 - 12 %   Eosinophils Relative 1  0 - 5 %   Basophils Relative 0  0 - 1 %   Neutro Abs 16.8 (*) 1.7 - 7.7 K/uL   Lymphs Abs 0.7  0.7 - 4.0 K/uL   Monocytes Absolute 0.7  0.1 - 1.0 K/uL   Eosinophils Absolute 0.2  0.0 - 0.7 K/uL   Basophils Absolute 0.0  0.0 - 0.1 K/uL   Smear Review MORPHOLOGY UNREMARKABLE    GLUCOSE, CAPILLARY     Status: Abnormal   Collection Time    03/25/13 10:53 AM      Result Value Ref Range   Glucose-Capillary 163  (*) 70 - 99 mg/dL   Comment 1 Documented in Chart     Comment 2 Notify RN    GLUCOSE, CAPILLARY     Status: Abnormal   Collection Time    03/25/13 11:48 AM      Result Value Ref Range  Glucose-Capillary 155 (*) 70 - 99 mg/dL   Comment 1 Documented in Chart     Comment 2 Notify RN    GLUCOSE, CAPILLARY     Status: Abnormal   Collection Time    03/25/13 12:53 PM      Result Value Ref Range   Glucose-Capillary 128 (*) 70 - 99 mg/dL  GLUCOSE, CAPILLARY     Status: Abnormal   Collection Time    03/25/13  1:40 PM      Result Value Ref Range   Glucose-Capillary 143 (*) 70 - 99 mg/dL  BASIC METABOLIC PANEL     Status: Abnormal   Collection Time    03/25/13  2:42 PM      Result Value Ref Range   Sodium 136 (*) 137 - 147 mEq/L   Potassium 3.7  3.7 - 5.3 mEq/L   Chloride 102  96 - 112 mEq/L   CO2 13 (*) 19 - 32 mEq/L   Glucose, Bld 157 (*) 70 - 99 mg/dL   BUN 74 (*) 6 - 23 mg/dL   Creatinine, Ser 2.80 (*) 0.50 - 1.10 mg/dL   Calcium 9.0  8.4 - 10.5 mg/dL   GFR calc non Af Amer 20 (*) >90 mL/min   GFR calc Af Amer 23 (*) >90 mL/min   Comment: (NOTE)     The eGFR has been calculated using the CKD EPI equation.     This calculation has not been validated in all clinical situations.     eGFR's persistently <90 mL/min signify possible Chronic Kidney     Disease.  GLUCOSE, CAPILLARY     Status: Abnormal   Collection Time    03/25/13  3:16 PM      Result Value Ref Range   Glucose-Capillary 155 (*) 70 - 99 mg/dL  BLOOD GAS, ARTERIAL     Status: Abnormal   Collection Time    03/25/13  4:05 PM      Result Value Ref Range   FIO2 1.00     pH, Arterial 7.425  7.350 - 7.450   pCO2 arterial 22.8 (*) 35.0 - 45.0 mmHg   pO2, Arterial 55.6 (*) 80.0 - 100.0 mmHg   Bicarbonate 14.6 (*) 20.0 - 24.0 mEq/L   TCO2 13.9  0 - 100 mmol/L   Acid-base deficit 8.4 (*) 0.0 - 2.0 mmol/L   O2 Saturation 90.3     Patient temperature 98.6     Collection site RIGHT RADIAL     Drawn by COLLECTED BY RT      Sample type ARTERIAL DRAW     Allens test (pass/fail) PASS  PASS  KETONES, QUALITATIVE     Status: None   Collection Time    03/25/13  4:10 PM      Result Value Ref Range   Acetone, Bld NEGATIVE  NEGATIVE  LACTIC ACID, PLASMA     Status: None   Collection Time    03/25/13  4:10 PM      Result Value Ref Range   Lactic Acid, Venous 1.0  0.5 - 2.2 mmol/L  PROCALCITONIN     Status: None   Collection Time    03/25/13  4:11 PM      Result Value Ref Range   Procalcitonin 3.25     Comment:            Interpretation:     PCT > 2 ng/mL:     Systemic infection (sepsis) is likely,     unless  other causes are known.     (NOTE)             ICU PCT Algorithm               Non ICU PCT Algorithm        ----------------------------     ------------------------------             PCT < 0.25 ng/mL                 PCT < 0.1 ng/mL         Stopping of antibiotics            Stopping of antibiotics           strongly encouraged.               strongly encouraged.        ----------------------------     ------------------------------           PCT level decrease by               PCT < 0.25 ng/mL           >= 80% from peak PCT           OR PCT 0.25 - 0.5 ng/mL          Stopping of antibiotics                                                 encouraged.         Stopping of antibiotics               encouraged.        ----------------------------     ------------------------------           PCT level decrease by              PCT >= 0.25 ng/mL           < 80% from peak PCT            AND PCT >= 0.5 ng/mL            Continuing antibiotics                                                  encouraged.           Continuing antibiotics                encouraged.        ----------------------------     ------------------------------         PCT level increase compared          PCT > 0.5 ng/mL             with peak PCT AND              PCT >= 0.5 ng/mL             Escalation of antibiotics  strongly encouraged.          Escalation of antibiotics            strongly encouraged.  GLUCOSE, CAPILLARY     Status: Abnormal   Collection Time    03/25/13  4:20 PM      Result Value Ref Range   Glucose-Capillary 159 (*) 70 - 99 mg/dL  GLUCOSE, CAPILLARY     Status: Abnormal   Collection Time    03/25/13  5:35 PM      Result Value Ref Range   Glucose-Capillary 157 (*) 70 - 99 mg/dL   Comment 1 Documented in Chart     Comment 2 Notify RN    GLUCOSE, CAPILLARY     Status: Abnormal   Collection Time    03/25/13  6:41 PM      Result Value Ref Range   Glucose-Capillary 174 (*) 70 - 99 mg/dL   Comment 1 Documented in Chart     Comment 2 Notify RN    GLUCOSE, CAPILLARY     Status: Abnormal   Collection Time    03/25/13  7:47 PM      Result Value Ref Range   Glucose-Capillary 162 (*) 70 - 99 mg/dL  BASIC METABOLIC PANEL     Status: Abnormal   Collection Time    03/25/13  7:50 PM      Result Value Ref Range   Sodium 138  137 - 147 mEq/L   Potassium 3.5 (*) 3.7 - 5.3 mEq/L   Chloride 103  96 - 112 mEq/L   CO2 15 (*) 19 - 32 mEq/L   Glucose, Bld 182 (*) 70 - 99 mg/dL   BUN 68 (*) 6 - 23 mg/dL   Creatinine, Ser 1.10 (*) 0.50 - 1.10 mg/dL   Calcium 8.9  8.4 - 47.5 mg/dL   GFR calc non Af Amer 19 (*) >90 mL/min   GFR calc Af Amer 22 (*) >90 mL/min   Comment: (NOTE)     The eGFR has been calculated using the CKD EPI equation.     This calculation has not been validated in all clinical situations.     eGFR's persistently <90 mL/min signify possible Chronic Kidney     Disease.  KETONES, URINE     Status: None   Collection Time    03/25/13  8:52 PM      Result Value Ref Range   Ketones, ur NEGATIVE  NEGATIVE mg/dL  GLUCOSE, CAPILLARY     Status: Abnormal   Collection Time    03/25/13  8:52 PM      Result Value Ref Range   Glucose-Capillary 160 (*) 70 - 99 mg/dL  GLUCOSE, CAPILLARY     Status: Abnormal   Collection Time    03/25/13  9:56 PM       Result Value Ref Range   Glucose-Capillary 162 (*) 70 - 99 mg/dL  GLUCOSE, CAPILLARY     Status: Abnormal   Collection Time    03/25/13 11:00 PM      Result Value Ref Range   Glucose-Capillary 179 (*) 70 - 99 mg/dL  BASIC METABOLIC PANEL     Status: Abnormal   Collection Time    03/26/13  1:22 AM      Result Value Ref Range   Sodium 140  137 - 147 mEq/L   Potassium 3.5 (*) 3.7 - 5.3 mEq/L   Chloride 105  96 - 112 mEq/L   CO2 16 (*)  19 - 32 mEq/L   Glucose, Bld 177 (*) 70 - 99 mg/dL   BUN 69 (*) 6 - 23 mg/dL   Creatinine, Ser 3.02 (*) 0.50 - 1.10 mg/dL   Calcium 8.8  8.4 - 10.5 mg/dL   GFR calc non Af Amer 18 (*) >90 mL/min   GFR calc Af Amer 21 (*) >90 mL/min   Comment: (NOTE)     The eGFR has been calculated using the CKD EPI equation.     This calculation has not been validated in all clinical situations.     eGFR's persistently <90 mL/min signify possible Chronic Kidney     Disease.  CBC     Status: Abnormal   Collection Time    03/26/13  6:35 AM      Result Value Ref Range   WBC 15.9 (*) 4.0 - 10.5 K/uL   RBC 2.28 (*) 3.87 - 5.11 MIL/uL   Hemoglobin 6.6 (*) 12.0 - 15.0 g/dL   Comment: REPEATED TO VERIFY     CRITICAL RESULT CALLED TO, READ BACK BY AND VERIFIED WITH:     C. AYERS RN AT 0645 ON 03.02.15 BY SHUEA   HCT 19.6 (*) 36.0 - 46.0 %   MCV 86.0  78.0 - 100.0 fL   MCH 28.9  26.0 - 34.0 pg   MCHC 33.7  30.0 - 36.0 g/dL   RDW 14.1  11.5 - 15.5 %   Platelets 224  150 - 400 K/uL  PROCALCITONIN     Status: None   Collection Time    03/26/13  6:35 AM      Result Value Ref Range   Procalcitonin 4.04     Comment:            Interpretation:     PCT > 2 ng/mL:     Systemic infection (sepsis) is likely,     unless other causes are known.     (NOTE)             ICU PCT Algorithm               Non ICU PCT Algorithm        ----------------------------     ------------------------------             PCT < 0.25 ng/mL                 PCT < 0.1 ng/mL         Stopping of  antibiotics            Stopping of antibiotics           strongly encouraged.               strongly encouraged.        ----------------------------     ------------------------------           PCT level decrease by               PCT < 0.25 ng/mL           >= 80% from peak PCT           OR PCT 0.25 - 0.5 ng/mL          Stopping of antibiotics  encouraged.         Stopping of antibiotics               encouraged.        ----------------------------     ------------------------------           PCT level decrease by              PCT >= 0.25 ng/mL           < 80% from peak PCT            AND PCT >= 0.5 ng/mL            Continuing antibiotics                                                  encouraged.           Continuing antibiotics                encouraged.        ----------------------------     ------------------------------         PCT level increase compared          PCT > 0.5 ng/mL             with peak PCT AND              PCT >= 0.5 ng/mL             Escalation of antibiotics                                              strongly encouraged.          Escalation of antibiotics            strongly encouraged.  BASIC METABOLIC PANEL     Status: Abnormal (Preliminary result)   Collection Time    03/26/13  6:35 AM      Result Value Ref Range   Sodium PENDING  137 - 147 mEq/L   Potassium PENDING  3.7 - 5.3 mEq/L   Chloride PENDING  96 - 112 mEq/L   CO2 PENDING  19 - 32 mEq/L   Glucose, Bld 153 (*) 70 - 99 mg/dL   BUN 71 (*) 6 - 23 mg/dL   Creatinine, Ser 3.10 (*) 0.50 - 1.10 mg/dL   Calcium 8.7  8.4 - 10.5 mg/dL   GFR calc non Af Amer 17 (*) >90 mL/min   GFR calc Af Amer 20 (*) >90 mL/min   Comment: (NOTE)     The eGFR has been calculated using the CKD EPI equation.     This calculation has not been validated in all clinical situations.     eGFR's persistently <90 mL/min signify possible Chronic Kidney     Disease.    Imaging /  Studies: Ct Abdomen Pelvis Wo Contrast  03/25/2013   CLINICAL DATA:  DKA. Question intra-abdominal infection/pelvic abscess. Leukocytosis. Question of IV contrast allergy.  EXAM: CT ABDOMEN AND PELVIS WITHOUT CONTRAST  TECHNIQUE: Multidetector CT imaging of the abdomen and pelvis was performed following the standard protocol without intravenous contrast.  COMPARISON:  10/19/2012 and 08/05/2009  FINDINGS: Lung bases demonstrate moderate consolidation with air bronchograms  over the posterior lower lobes right worse than left likely a pneumonia. Small bilateral pleural effusions are present. Heart is normal in size.  Abdominal images demonstrate evidence of a prior cholecystectomy. There are a few small calcified splenic granulomas. Liver is within normal. The pancreas and adrenal glands are normal. Kidneys are normal in size without evidence of hydronephrosis or nephrolithiasis. Ureters are within normal. Appendix is normal. There is mild calcified plaque over the abdominal aorta and iliac vessels.  Pelvic images demonstrate a Foley catheter present within a decompressed bladder. An IUD is in adequate position over the uterine fundus. There is evidence of several uterine fibroids unchanged. There are bilateral simple cystic appearing ovaries. No free fluid in the pelvis. No evidence of adenopathy. There is mild degenerative change of the hips.  IMPRESSION: Airspace consolidation over the posterior lower lobes right worse than left with air bronchograms suggesting multifocal pneumonia. Small bilateral pleural effusions.  No acute findings in the abdomen/pelvis.  Fibroid uterus unchanged. Simple cystic bilateral ovaries. IUD in adequate position.   Electronically Signed   By: Marin Olp M.D.   On: 03/25/2013 14:37   Dg Chest Port 1 View  03/25/2013   CLINICAL DATA:  Shortness breath.  EXAM: PORTABLE CHEST - 1 VIEW  COMPARISON:  03/24/2013 chest x-ray. 03/25/2013 CT abdomen and pelvis.  FINDINGS: The CT detected  basilar consolidation (more notable on the right) and small pleural effusions better appreciated on recent abdominal CT.  Pulmonary vascular congestion/mild pulmonary edema is similar to prior exam.  Cardiomegaly.  No Demetrious Rainford pneumothorax.  IMPRESSION: The CT detected basilar consolidation (more notable on the right) and small pleural effusions better appreciated on recent abdominal CT.  Pulmonary vascular congestion/mild pulmonary edema is similar to prior exam.  Cardiomegaly.   Electronically Signed   By: Chauncey Cruel M.D.   On: 03/25/2013 17:38   Dg Chest Port 1 View  03/24/2013   CLINICAL DATA:  Acute respiratory distress.  EXAM: PORTABLE CHEST - 1 VIEW  COMPARISON:  10/30/2012  FINDINGS: There are irregularly thickened interstitial markings which predominate centrally. This is consistent with interstitial edema. Hazy basilar opacity may reflect small effusions.  Cardiac silhouette is normal in size. Normal mediastinal and hilar contours.  IMPRESSION: Findings consistent with interstitial pulmonary edema.   Electronically Signed   By: Lajean Manes M.D.   On: 03/24/2013 16:37    Medications / Allergies: per chart  Antibiotics: Anti-infectives   Start     Dose/Rate Route Frequency Ordered Stop   03/25/13 1800  levofloxacin (LEVAQUIN) IVPB 750 mg     750 mg 100 mL/hr over 90 Minutes Intravenous Every 48 hours 03/25/13 1625     03/24/13 2000  vancomycin (VANCOCIN) 1,750 mg in sodium chloride 0.9 % 500 mL IVPB     1,750 mg 250 mL/hr over 120 Minutes Intravenous Every 24 hours 03/23/13 1903     03/23/13 2000  piperacillin-tazobactam (ZOSYN) IVPB 3.375 g     3.375 g 12.5 mL/hr over 240 Minutes Intravenous 3 times per day 03/23/13 1903     03/23/13 2000  vancomycin (VANCOCIN) 2,000 mg in sodium chloride 0.9 % 500 mL IVPB     2,000 mg 250 mL/hr over 120 Minutes Intravenous  Once 03/23/13 1903 03/24/13 0005   03/23/13 1345  clindamycin (CLEOCIN) IVPB 600 mg     600 mg 100 mL/hr over 30 Minutes  Intravenous  Once 03/23/13 1338 03/23/13 1514       Note: This dictation was prepared  with Dragon/digital dictation along with [employer]. Any transcriptional errors that result from this process are unintentional.

## 2013-03-26 NOTE — Progress Notes (Signed)
[facility] PROGRESS NOTE  Jasmine [name] ZOX:[id number] DOB: February 10, 1970 DOA: 03/23/2013 PCP: Dois Davenport., MD  Assessment/Plan: 1. Diabetic ketoacidosis. Ketones coming back negative, though continues to have an anion gap. She was transitioned to basal insulin on 03/26/13 with sliding scale coverage.  2. Acute hypoxemic respiratory failure, evidence by an oxygen requirement of 5 L. likely secondary to combination of pulmonary edema and the development of multifocal pneumonia. A CT scan performed on 03/25/2013 showing airspace consolidation suggesting multifocal pneumonia. Pulmonary critical care medicine consulted as she has a history of hypoxemic respiratory failure requiring intubation. 3. Left buttock abscess, CT scan of abdomen and pelvis not reveal evidence of deep infection. Appreciate surgery's recommendations.  4. Community acquire pneumonia versus hospital acquired pneumonia. Patient does not appear to have clinical signs or symptoms suggesting pneumonia, however CT scan of abdomen and pelvis did demonstrate airspace consolidation over the posterior lower lobes consistent with multifocal pneumonia. Will continue broad-spectrum IV antibiotic therapy with IV Zosyn, vancomycin and Levaquin. 5. Chronic diastolic congestive heart failure. Transthoracic echocardiogram performed on 10/08/2012 showing ejection fraction of 55-60% with grade 2 diastolic dysfunction. She is receiving IV fluid resuscitation given present state of diabetic ketoacidosis. IV fluids administered for volume expansion and dehydration in setting of DKA which likely has been complicated by the development of pulmonary edema. BNP, back elevated at 6479. She remains on diuretic therapy. 6. Acute on chronic renal failure. Patient's creatinine remaining elevated at at 2.9 on 03/25/2013. Looking back at her records it appears that her baseline creatinine fluctuates between 1.7 to 2.0. She was started on diuretic therapy given the  development of acute hypoxemic respiratory failure in setting of pulmonary edema. Her creatinine trending upwards, presently at 3.1 for which nephrology has been consulted. 7. Metabolic acidosis. Patient's bicarbonate at 16. I discussed case with Dr. Arlean Hopping of nephrology inquired as to the possibility of underlying kidney failure causing metabolic acidosis versus ongoing diabetic ketoacidosis. It's likely that kidney failure is driving metabolic acidosis. Appreciate nephrology's recommendations.  Code Status: Full code Family Communication: Family not present Disposition Plan: Continue monitoring and treatment in the step down unit   Procedures:  Incision and drainage of left buttock abscess on 03/23/2013  Antibiotics:  Vancomycin (started on 03/23/2013)  Zosyn (started on 03/23/2013)  HPI/Subjective: Patient is a pleasant 43 year old female with a past medical history of poorly controlled insulin-dependent diabetes mellitus, nonobstructive coronary artery disease based on cardiac catheterization in 2010, chronic diastolic congestive heart failure, hypertension, stage III chronic kidney disease who was admitted to the medicine service on 03/23/2013 presenting with complaints of elevated blood sugars. She had complained of polydipsia, nausea, vomiting, headache, malaise, weakness over the 24 hours prior to admission. In the emergency room she was found to have a left buttock abscess undergoing incision and drainage. Labs indicating diabetic ketoacidosis as she was started on IV insulin place on the DKA protocol and admitted to the step down unit. She was started on empiric IV antibiotic therapy with Zosyn and vancomycin. On the evening of 03/23/2013 she complained of chest pain as troponin level was negative. Patient reported feeling quite anxious at the time symptoms occurred. Her troponin was cycled and remained negative x3 sets. On 03/24/2013 she gradually became dyspneic, having an increased  oxygen requirement and by late afternoon wasn't needing 5 L supplemental oxygen to maintain sats in the low 90s. Lab work revealed an elevated BNP of 6470 as chest x-ray showed the development of pulmonary edema. She was given 60  mg of IV Lasix x1 and continued on 40 mg IV twice. With the administration of Lasix however her creatinine has slowly trended up. Patient's bicarbonate fluctuate between 16 and 17, continued to have an anion gap of 19 on 03/25/2013. Patient's wound from I&D of left buttock abscess assess on 03/25/2013 which appeared worse with increased erythema no purulence was not seen. General surgery was consulted to assess wound.  With the possible deep infection,  a CT scan of abdomen and pelvis was ordered. CT did not show evidence of a deep wound infection however did reveal the presence of multifocal pneumonia. Up to that point she had been on IV vancomycin and Zosyn. With regard to her kidney function lab work revealing an upward trend in her creatinine as well as persistent metabolic acidosis. I discussed case with Dr. Arlean Hopping of nephrology on 03/25/2013 as it is probable that underlying kidney failure is the driving her metabolic acidosis. Ketones coming back negative as it was felt that renal failure be the main driver of metabolic acidosis. Her insulin drip was discontinued on 03/26/2013, started on basal insulin with sliding scale coverage. Given worsening respiratory status pulmonary critical care medicine was also consulted on 03/25/2013. There was concern given her history of going into acute hypoxemic respiratory failure requiring intubation on a previous hospitalization. It was felt that respiratory failure likely secondary to combination of pulmonary edema and pneumonia.  antibiotic coverage was expanded with the addition of Levaquin.    Objective: Filed Vitals:   03/26/13 1130  BP:   Pulse:   Temp: 98.5 F (36.9 C)  Resp:     Intake/Output Summary (Last 24 hours) at  03/26/13 1209 Last data filed at 03/26/13 1030  Gross per 24 hour  Intake 1698.69 ml  Output   1465 ml  Net 233.69 ml   Filed Weights   03/24/13 0000 03/25/13 0400 03/26/13 0000  Weight: 128.4 kg (283 lb 1.1 oz) 131.4 kg (289 lb 11 oz) 130.6 kg (287 lb 14.7 oz)    Exam:   General:  Patient appears mildly anxious this morning however in no significant distress. Currently denies chest pain.  Cardiovascular: Regular rate and rhythm normal S1-S2 no murmurs rubs or gallops  Respiratory: Clear to auscultation bilaterally no wheezing rhonchi or rales  Abdomen: Soft nontender nondistended  Musculoskeletal: No edema  Skin: Status post incision and drainage of left buttock abscess, area inspected, wound packed. There appears to be increased erythema and induration over buttock abscess, with localized warmth, change from yesterday's examination. I cannot express purulence from I&D site.  Data Reviewed: Basic Metabolic Panel:  Recent Labs Lab 03/25/13 1005 03/25/13 1442 03/25/13 1950 03/26/13 0122 03/26/13 0635  NA 139 136* 138 140 136*  K 3.5* 3.7 3.5* 3.5* 3.5*  CL 104 102 103 105 101  CO2 16* 13* 15* 16* 14*  GLUCOSE 187* 157* 182* 177* 153*  BUN 72* 74* 68* 69* 71*  CREATININE 2.97* 2.80* 2.90* 3.02* 3.10*  CALCIUM 8.6 9.0 8.9 8.8 8.7   Liver Function Tests: No results found for this basename: AST, ALT, ALKPHOS, BILITOT, PROT, ALBUMIN,  in the last 168 hours No results found for this basename: LIPASE, AMYLASE,  in the last 168 hours No results found for this basename: AMMONIA,  in the last 168 hours CBC:  Recent Labs Lab 03/23/13 1227 03/23/13 1919 03/25/13 1005 03/26/13 0635  WBC 21.8* 20.0* 18.4* 15.9*  NEUTROABS 19.3*  --  16.8*  --   HGB 8.5* 8.4*  7.4* 6.6*  HCT 25.3* 25.2* 21.8* 19.6*  MCV 87.2 86.6 86.5 86.0  PLT 241 223 211 224   Cardiac Enzymes:  Recent Labs Lab 03/24/13 0700 03/24/13 1200 03/24/13 1826  TROPONINI <0.30 <0.30 <0.30   BNP (last  3 results)  Recent Labs  10/07/12 2126 11/01/12 1446 03/24/13 1200  PROBNP 1631.0* 2441.0* 6479.0*   CBG:  Recent Labs Lab 03/26/13 0634 03/26/13 0740 03/26/13 0849 03/26/13 0954 03/26/13 1105  GLUCAP 145* 151* 150* 167* 178*    Recent Results (from the past 240 hour(s))  CULTURE, BLOOD (ROUTINE X 2)     Status: None   Collection Time    03/23/13 12:55 PM      Result Value Ref Range Status   Specimen Description BLOOD RIGHT ANTECUBITAL   Final   Special Requests BOTTLES DRAWN AEROBIC AND ANAEROBIC 4ML   Final   Culture  Setup Time     Final   Value: 03/23/2013 16:40     Performed at [facility]   Culture     Final   Value:        BLOOD CULTURE RECEIVED NO GROWTH TO DATE CULTURE WILL BE HELD FOR 5 DAYS BEFORE ISSUING A FINAL NEGATIVE REPORT     Performed at [facility]   Report Status PENDING   Incomplete  CULTURE, BLOOD (ROUTINE X 2)     Status: None   Collection Time    03/23/13  1:05 PM      Result Value Ref Range Status   Specimen Description BLOOD RIGHT ARM   Final   Special Requests BOTTLES DRAWN AEROBIC AND ANAEROBIC 4ML   Final   Culture  Setup Time     Final   Value: 03/23/2013 16:40     Performed at [facility]   Culture     Final   Value:        BLOOD CULTURE RECEIVED NO GROWTH TO DATE CULTURE WILL BE HELD FOR 5 DAYS BEFORE ISSUING A FINAL NEGATIVE REPORT     Performed at [facility]   Report Status PENDING   Incomplete  WOUND CULTURE     Status: None   Collection Time    03/23/13  1:48 PM      Result Value Ref Range Status   Specimen Description BUTTOCKS RIGHT   Final   Special Requests NONE   Final   Gram Stain     Final   Value: RARE WBC PRESENT,BOTH PMN AND MONONUCLEAR     NO SQUAMOUS EPITHELIAL CELLS SEEN     FEW GRAM POSITIVE COCCI IN PAIRS     FEW GRAM NEGATIVE RODS     FEW GRAM POSITIVE RODS   Culture     Final   Value: FEW PROTEUS MIRABILIS     Performed at [facility]   Report Status  03/25/2013 FINAL   Final   Organism ID, Bacteria PROTEUS MIRABILIS   Final  MRSA PCR SCREENING     Status: None   Collection Time    03/23/13  6:06 PM      Result Value Ref Range Status   MRSA by PCR NEGATIVE  NEGATIVE Final   Comment:            The GeneXpert MRSA Assay (FDA     approved for NASAL specimens     only), is one component of a     comprehensive MRSA colonization     surveillance program. It  is not     intended to diagnose MRSA     infection nor to guide or     monitor treatment for     MRSA infections.     Studies: Ct Abdomen Pelvis Wo Contrast  03/25/2013   CLINICAL DATA:  DKA. Question intra-abdominal infection/pelvic abscess. Leukocytosis. Question of IV contrast allergy.  EXAM: CT ABDOMEN AND PELVIS WITHOUT CONTRAST  TECHNIQUE: Multidetector CT imaging of the abdomen and pelvis was performed following the standard protocol without intravenous contrast.  COMPARISON:  10/19/2012 and 08/05/2009  FINDINGS: Lung bases demonstrate moderate consolidation with air bronchograms over the posterior lower lobes right worse than left likely a pneumonia. Small bilateral pleural effusions are present. Heart is normal in size.  Abdominal images demonstrate evidence of a prior cholecystectomy. There are a few small calcified splenic granulomas. Liver is within normal. The pancreas and adrenal glands are normal. Kidneys are normal in size without evidence of hydronephrosis or nephrolithiasis. Ureters are within normal. Appendix is normal. There is mild calcified plaque over the abdominal aorta and iliac vessels.  Pelvic images demonstrate a Foley catheter present within a decompressed bladder. An IUD is in adequate position over the uterine fundus. There is evidence of several uterine fibroids unchanged. There are bilateral simple cystic appearing ovaries. No free fluid in the pelvis. No evidence of adenopathy. There is mild degenerative change of the hips.  IMPRESSION: Airspace consolidation  over the posterior lower lobes right worse than left with air bronchograms suggesting multifocal pneumonia. Small bilateral pleural effusions.  No acute findings in the abdomen/pelvis.  Fibroid uterus unchanged. Simple cystic bilateral ovaries. IUD in adequate position.   Electronically Signed   By: Elberta Fortis M.D.   On: 03/25/2013 14:37   Dg Chest Port 1 View  03/25/2013   CLINICAL DATA:  Shortness breath.  EXAM: PORTABLE CHEST - 1 VIEW  COMPARISON:  03/24/2013 chest x-ray. 03/25/2013 CT abdomen and pelvis.  FINDINGS: The CT detected basilar consolidation (more notable on the right) and small pleural effusions better appreciated on recent abdominal CT.  Pulmonary vascular congestion/mild pulmonary edema is similar to prior exam.  Cardiomegaly.  No gross pneumothorax.  IMPRESSION: The CT detected basilar consolidation (more notable on the right) and small pleural effusions better appreciated on recent abdominal CT.  Pulmonary vascular congestion/mild pulmonary edema is similar to prior exam.  Cardiomegaly.   Electronically Signed   By: Bridgett Larsson M.D.   On: 03/25/2013 17:38   Dg Chest Port 1 View  03/24/2013   CLINICAL DATA:  Acute respiratory distress.  EXAM: PORTABLE CHEST - 1 VIEW  COMPARISON:  10/30/2012  FINDINGS: There are irregularly thickened interstitial markings which predominate centrally. This is consistent with interstitial edema. Hazy basilar opacity may reflect small effusions.  Cardiac silhouette is normal in size. Normal mediastinal and hilar contours.  IMPRESSION: Findings consistent with interstitial pulmonary edema.   Electronically Signed   By: Amie Portland M.D.   On: 03/24/2013 16:37    Scheduled Meds: . amLODipine  5 mg Oral BID  . aspirin EC  81 mg Oral Daily  . atorvastatin  40 mg Oral q1800  . furosemide  120 mg Intravenous 3 times per day  . heparin  5,000 Units Subcutaneous 3 times per day  . hydrALAZINE  25 mg Oral TID  . insulin aspart  0-9 Units Subcutaneous 6  times per day  . insulin glargine  10 Units Subcutaneous Q12H  . levofloxacin (LEVAQUIN) IV  750 mg Intravenous  Q48H  . nitroGLYCERIN  0.5 inch Topical 4 times per day  . piperacillin-tazobactam (ZOSYN)  IV  3.375 g Intravenous 3 times per day  . potassium chloride  10 mEq Intravenous Q1 Hr x 2  . [START ON 03/27/2013] vancomycin  1,750 mg Intravenous Q48H   Continuous Infusions: . sodium chloride 20 mL/hr (03/24/13 0811)  . dextrose 5 % and 0.45% NaCl 50 mL/hr at 03/26/13 1207    Principal Problem:   DKA (diabetic ketoacidoses) Active Problems:   Left buttock abscess   Acute-on-chronic kidney injury   Sepsis   Acute respiratory failure   DIABETES MELLITUS, TYPE I   DEPRESSION   ALLERGIC RHINITIS   Heart failure, diastolic, chronic   Hypertension   OSA (obstructive sleep apnea)   Anemia, chronic disease   CKD (chronic kidney disease), stage IV   Gastroparesis   Cough   Leukocytosis   Bacteria in urine, possible UTI   Abscess, perirectal s/p I&D 03/23/2013    Time spent: 35 minutes    Jeralyn Bennett  [facility] Pager [phone]. If 7PM-7AM, please contact night-coverage at [email or web address], password [facility] 03/26/2013, 12:09 PM  LOS: 3 days

## 2013-03-26 NOTE — Plan of Care (Signed)
Problem: Phase I Progression Outcomes Goal: CBGs steadily decreasing on IV insulin drip Outcome: Completed/Met Date Met:  03/26/13 Pt off DKA protocol and insulin drip.Cbgs q 4hrswith sliding scale coverage.

## 2013-03-26 NOTE — Progress Notes (Signed)
CARE MANAGEMENT NOTE 03/26/2013  Patient:  Jasmine [name],Jasmine [name]   Account Number:  [id number]  Date Initiated:  03/26/2013  Documentation initiated by:  DAVIS,RHONDA  Subjective/Objective Assessment:   diabetic with wound to buttock with purulent drainage requiring i&d, iv insulin ongoing for control.     Action/Plan:   home when stable   Anticipated DC Date:  03/29/2013   Anticipated DC Plan:  HOME/SELF CARE  In-house referral  NA      DC Planning Services  NA      [employer] [facility] [facility]  NA   Choice offered to / List presented to:  NA   DME arranged  NA      DME agency  NA     HH arranged  NA      HH agency  NA   Status of service:  In process, will continue to follow Medicare Important Message given?  NA - LOS <3 / Initial given by admissions (If response is "NO", the following Medicare IM given date fields will be blank) Date Medicare IM given:   Date Additional Medicare IM given:    Discharge Disposition:    Per UR Regulation:  Reviewed for med. necessity/level of care/duration of stay  If discussed at Long Length of Stay Meetings, dates discussed:    Comments:  [id number]/Rhonda Stark JockDavis, RN, BSN, [place] [phone] Chart Reviewed for discharge and hospital needs. Discharge needs at time of review:  None present will follow for needs. Review of patient progress due on [id number].

## 2013-03-26 NOTE — Progress Notes (Signed)
ANTIBIOTIC CONSULT NOTE - FOLLOW UP  Pharmacy Consult for Vancomycin, Zosyn Indication: L buttock cellulitis and abscess  Allergies  Allergen Reactions  . Contrast Media [Iodinated Diagnostic Agents] Nausea And Vomiting  . Paroxetine Nausea And Vomiting  . Oxycodone Itching and Rash    Patient Measurements: Height: 5\' 6"  (167.6 cm) Weight: 287 lb 14.7 oz (130.6 kg) IBW/kg (Calculated) : 59.3  Vital Signs: Temp: 99.8 F (37.7 C) (03/02 0800) Temp src: Axillary (03/02 0800) BP: 138/71 mmHg (03/02 0803) Pulse Rate: 85 (03/02 0803) Intake/Output from previous day: 03/01 0701 - 03/02 0700 In: 1815.3 [I.V.:765.3; IV Piggyback:1050] Out: 1915 [Urine:1915] Intake/Output from this shift: Total I/O In: 10 [I.V.:10] Out: -   Labs:  Recent Labs  03/23/13 1919  03/25/13 1005  03/25/13 1950 03/26/13 0122 03/26/13 0635  WBC 20.0*  --  18.4*  --   --   --  15.9*  HGB 8.4*  --  7.4*  --   --   --  6.6*  PLT 223  --  211  --   --   --  224  CREATININE 2.26*  < > 2.97*  < > 2.90* 3.02* 3.10*  < > = values in this interval not displayed. Estimated Creatinine Clearance: 32.8 ml/min (by C-G formula based on Cr of 3.1). No results found for this basename: VANCOTROUGH, VANCOPEAK, VANCORANDOM, GENTTROUGH, GENTPEAK, GENTRANDOM, TOBRATROUGH, TOBRAPEAK, TOBRARND, AMIKACINPEAK, AMIKACINTROU, AMIKACIN,  in the last 72 hours   Microbiology: Recent Results (from the past 720 hour(s))  CULTURE, BLOOD (ROUTINE X 2)     Status: None   Collection Time    03/23/13 12:55 PM      Result Value Ref Range Status   Specimen Description BLOOD RIGHT ANTECUBITAL   Final   Special Requests BOTTLES DRAWN AEROBIC AND ANAEROBIC 4ML   Final   Culture  Setup Time     Final   Value: 03/23/2013 16:40     Performed at [facility]   Culture     Final   Value:        BLOOD CULTURE RECEIVED NO GROWTH TO DATE CULTURE WILL BE HELD FOR 5 DAYS BEFORE ISSUING A FINAL NEGATIVE REPORT     Performed at [facility]   Report Status PENDING   Incomplete  CULTURE, BLOOD (ROUTINE X 2)     Status: None   Collection Time    03/23/13  1:05 PM      Result Value Ref Range Status   Specimen Description BLOOD RIGHT ARM   Final   Special Requests BOTTLES DRAWN AEROBIC AND ANAEROBIC 4ML   Final   Culture  Setup Time     Final   Value: 03/23/2013 16:40     Performed at [facility]   Culture     Final   Value:        BLOOD CULTURE RECEIVED NO GROWTH TO DATE CULTURE WILL BE HELD FOR 5 DAYS BEFORE ISSUING A FINAL NEGATIVE REPORT     Performed at [facility]   Report Status PENDING   Incomplete  WOUND CULTURE     Status: None   Collection Time    03/23/13  1:48 PM      Result Value Ref Range Status   Specimen Description BUTTOCKS RIGHT   Final   Special Requests NONE   Final   Gram Stain     Final   Value: RARE WBC PRESENT,BOTH PMN AND MONONUCLEAR  NO SQUAMOUS EPITHELIAL CELLS SEEN     FEW GRAM POSITIVE COCCI IN PAIRS     FEW GRAM NEGATIVE RODS     FEW GRAM POSITIVE RODS   Culture     Final   Value: FEW PROTEUS MIRABILIS     Performed at [facility]   Report Status 03/25/2013 FINAL   Final   Organism ID, Bacteria PROTEUS MIRABILIS   Final  MRSA PCR SCREENING     Status: None   Collection Time    03/23/13  6:06 PM      Result Value Ref Range Status   MRSA by PCR NEGATIVE  NEGATIVE Final   Comment:            The GeneXpert MRSA Assay (FDA     approved for NASAL specimens     only), is one component of a     comprehensive MRSA colonization     surveillance program. It is not     intended to diagnose MRSA     infection nor to guide or     monitor treatment for     MRSA infections.    Anti-infectives   Start     Dose/Rate Route Frequency Ordered Stop   03/25/13 1800  levofloxacin (LEVAQUIN) IVPB 750 mg     750 mg 100 mL/hr over 90 Minutes Intravenous Every 48 hours 03/25/13 1625     03/24/13 2000  vancomycin (VANCOCIN) 1,750 mg in sodium chloride 0.9 %  500 mL IVPB     1,750 mg 250 mL/hr over 120 Minutes Intravenous Every 24 hours 03/23/13 1903     03/23/13 2000  piperacillin-tazobactam (ZOSYN) IVPB 3.375 g     3.375 g 12.5 mL/hr over 240 Minutes Intravenous 3 times per day 03/23/13 1903     03/23/13 2000  vancomycin (VANCOCIN) 2,000 mg in sodium chloride 0.9 % 500 mL IVPB     2,000 mg 250 mL/hr over 120 Minutes Intravenous  Once 03/23/13 1903 03/24/13 0005   03/23/13 1345  clindamycin (CLEOCIN) IVPB 600 mg     600 mg 100 mL/hr over 30 Minutes Intravenous  Once 03/23/13 1338 03/23/13 1514      Assessment: 42 yoF admitted with left buttock cellulitis/abscess and DKA.  Perirectal abscess drained in ED.  CT abdomen performed 3/1 showed multifocal pneumonia, no acute findings in the abdomen/pelvis.  Patient remains febrile, remains on insulin gtt for anion gap, and is in acute on chronic renal failure.  Day #4 Vancomycin 2g loading dose followed by 1750 mg IV q24h (obese dosing) and Zosyn 3.375g IV q8h (4 hour infusion time) for buttock cellulitis/abscess and pneumonia.  D2 Levaquin 750 mg IV q48h, added for atypical coverage of pneumonia.  Tmax24h: 101.6  WBC: 15.9  SCr 3.1 (increasing).  Baseline ~2.3.  CrCl~26 ml/min (normalized), ~46 ml/min (CG)  Wound culture grew pan-sensitive P.mirabilis (finalized)   Goal of Therapy:  Vancomycin trough level 15-20 mcg/ml  Plan:  1.  Adjust vancomycin to 1750 mg IV q48h for worsening SCr.  Consider narrowing therapy soon.  Nephrology consult suggesting discontinuing vancomycin is possible. 2.  No adjustments needed to Zosyn or Levaquin at this time.  Clance Boll 03/26/2013,9:20 AM

## 2013-03-26 NOTE — Progress Notes (Signed)
eLink Physician-Brief Progress Note Patient Name: Jasmine [name] DOB: [birth year] MRN: [id number]  Date of Service  03/26/2013   HPI/Events of Note   Hg 6.6 from 7.4.  No signs of active bleeding.  eICU Interventions  Transfused x1 unit.   Intervention Category Major Interventions: Other:  Desarae Placide 03/26/2013, 6:50 AM

## 2013-03-27 ENCOUNTER — Inpatient Hospital Stay (HOSPITAL_COMMUNITY): Payer: 59

## 2013-03-27 DIAGNOSIS — N179 Acute kidney failure, unspecified: Secondary | ICD-10-CM

## 2013-03-27 DIAGNOSIS — J9600 Acute respiratory failure, unspecified whether with hypoxia or hypercapnia: Secondary | ICD-10-CM

## 2013-03-27 DIAGNOSIS — J9601 Acute respiratory failure with hypoxia: Secondary | ICD-10-CM | POA: Diagnosis present

## 2013-03-27 LAB — BLOOD GAS, ARTERIAL
ACID-BASE DEFICIT: 8.4 mmol/L — AB (ref 0.0–2.0)
Acid-base deficit: 10.6 mmol/L — ABNORMAL HIGH (ref 0.0–2.0)
Acid-base deficit: 11.4 mmol/L — ABNORMAL HIGH (ref 0.0–2.0)
Acid-base deficit: 11.5 mmol/L — ABNORMAL HIGH (ref 0.0–2.0)
BICARBONATE: 13.8 meq/L — AB (ref 20.0–24.0)
BICARBONATE: 14.3 meq/L — AB (ref 20.0–24.0)
Bicarbonate: 13.5 mEq/L — ABNORMAL LOW (ref 20.0–24.0)
Bicarbonate: 16 mEq/L — ABNORMAL LOW (ref 20.0–24.0)
Delivery systems: POSITIVE
Drawn by: 307971
Drawn by: 317871
Drawn by: 317871
Drawn by: 317871
Expiratory PAP: 5
FIO2: 0.6 %
FIO2: 0.6 %
FIO2: 1 %
FIO2: 1 %
Inspiratory PAP: 10
LHR: 30 {breaths}/min
MECHVT: 0.47 mL
O2 Saturation: 92.1 %
O2 Saturation: 94.9 %
O2 Saturation: 96.6 %
O2 Saturation: 98.6 %
PATIENT TEMPERATURE: 98.6
PATIENT TEMPERATURE: 37
PCO2 ART: 27 mmHg — AB (ref 35.0–45.0)
PCO2 ART: 34.6 mmHg — AB (ref 35.0–45.0)
PEEP/CPAP: 8 cmH2O
PEEP/CPAP: 8 cmH2O
PEEP: 5 cmH2O
PEEP: 8 cmH2O
PH ART: 7.244 — AB (ref 7.350–7.450)
PH ART: 7.332 — AB (ref 7.350–7.450)
PO2 ART: 83.5 mmHg (ref 80.0–100.0)
PO2 ART: 88.5 mmHg (ref 80.0–100.0)
Patient temperature: 99.7
Patient temperature: 99.7
RATE: 15 resp/min
RATE: 20 resp/min
RATE: 30 resp/min
TCO2: 13.2 mmol/L (ref 0–100)
TCO2: 13.3 mmol/L (ref 0–100)
TCO2: 14 mmol/L (ref 0–100)
TCO2: 15.5 mmol/L (ref 0–100)
VT: 550 mL
VT: 480 mL
pCO2 arterial: 28.1 mmHg — ABNORMAL LOW (ref 35.0–45.0)
pCO2 arterial: 30.2 mmHg — ABNORMAL LOW (ref 35.0–45.0)
pH, Arterial: 7.304 — ABNORMAL LOW (ref 7.350–7.450)
pH, Arterial: 7.345 — ABNORMAL LOW (ref 7.350–7.450)
pO2, Arterial: 125 mmHg — ABNORMAL HIGH (ref 80.0–100.0)
pO2, Arterial: 65.7 mmHg — ABNORMAL LOW (ref 80.0–100.0)

## 2013-03-27 LAB — CBC WITH DIFFERENTIAL/PLATELET
BASOS PCT: 0 % (ref 0–1)
Basophils Absolute: 0 10*3/uL (ref 0.0–0.1)
EOS ABS: 0.1 10*3/uL (ref 0.0–0.7)
Eosinophils Relative: 0 % (ref 0–5)
HCT: 20.6 % — ABNORMAL LOW (ref 36.0–46.0)
Hemoglobin: 7 g/dL — ABNORMAL LOW (ref 12.0–15.0)
LYMPHS ABS: 0.5 10*3/uL — AB (ref 0.7–4.0)
Lymphocytes Relative: 3 % — ABNORMAL LOW (ref 12–46)
MCH: 29 pg (ref 26.0–34.0)
MCHC: 34 g/dL (ref 30.0–36.0)
MCV: 85.5 fL (ref 78.0–100.0)
Monocytes Absolute: 1 10*3/uL (ref 0.1–1.0)
Monocytes Relative: 7 % (ref 3–12)
NEUTROS ABS: 12.7 10*3/uL — AB (ref 1.7–7.7)
Neutrophils Relative %: 89 % — ABNORMAL HIGH (ref 43–77)
PLATELETS: 238 10*3/uL (ref 150–400)
RBC: 2.41 MIL/uL — ABNORMAL LOW (ref 3.87–5.11)
RDW: 14.4 % (ref 11.5–15.5)
WBC: 14.2 10*3/uL — ABNORMAL HIGH (ref 4.0–10.5)

## 2013-03-27 LAB — BASIC METABOLIC PANEL
BUN: 79 mg/dL — ABNORMAL HIGH (ref 6–23)
BUN: 83 mg/dL — ABNORMAL HIGH (ref 6–23)
BUN: 87 mg/dL — ABNORMAL HIGH (ref 6–23)
CALCIUM: 8.7 mg/dL (ref 8.4–10.5)
CHLORIDE: 97 meq/L (ref 96–112)
CO2: 13 mEq/L — ABNORMAL LOW (ref 19–32)
CO2: 14 meq/L — AB (ref 19–32)
CO2: 17 meq/L — AB (ref 19–32)
CREATININE: 3.42 mg/dL — AB (ref 0.50–1.10)
CREATININE: 3.65 mg/dL — AB (ref 0.50–1.10)
CREATININE: 4.07 mg/dL — AB (ref 0.50–1.10)
Calcium: 9 mg/dL (ref 8.4–10.5)
Calcium: 8.7 mg/dL (ref 8.4–10.5)
Chloride: 98 mEq/L (ref 96–112)
Chloride: 101 mEq/L (ref 96–112)
GFR calc non Af Amer: 15 mL/min — ABNORMAL LOW (ref >90)
GFR calc non Af Amer: 14 mL/min — ABNORMAL LOW (ref >90)
GFR, EST AFRICAN AMERICAN: 15 mL/min — AB (ref >90)
GFR, EST AFRICAN AMERICAN: 17 mL/min — AB (ref >90)
GFR, EST AFRICAN AMERICAN: 18 mL/min — AB (ref >90)
GFR, EST NON AFRICAN AMERICAN: 13 mL/min — AB (ref >90)
Glucose, Bld: 248 mg/dL — ABNORMAL HIGH (ref 70–99)
Glucose, Bld: 296 mg/dL — ABNORMAL HIGH (ref 70–99)
Glucose, Bld: 199 mg/dL — ABNORMAL HIGH (ref 70–99)
POTASSIUM: 3.6 meq/L — AB (ref 3.7–5.3)
POTASSIUM: 3.8 meq/L (ref 3.7–5.3)
Potassium: 3.6 mEq/L — ABNORMAL LOW (ref 3.7–5.3)
SODIUM: 137 meq/L (ref 137–147)
Sodium: 134 mEq/L — ABNORMAL LOW (ref 137–147)
Sodium: 135 mEq/L — ABNORMAL LOW (ref 137–147)

## 2013-03-27 LAB — POCT I-STAT 3, ART BLOOD GAS (G3+)
ACID-BASE DEFICIT: 8 mmol/L — AB (ref 0.0–2.0)
BICARBONATE: 16.6 meq/L — AB (ref 20.0–24.0)
O2 SAT: 96 %
PO2 ART: 81 mmHg (ref 80.0–100.0)
Patient temperature: 98
TCO2: 17 mmol/L (ref 0–100)
pCO2 arterial: 30.4 mmHg — ABNORMAL LOW (ref 35.0–45.0)
pH, Arterial: 7.343 — ABNORMAL LOW (ref 7.350–7.450)

## 2013-03-27 LAB — TYPE AND SCREEN
ABO/RH(D): A POS
ANTIBODY SCREEN: NEGATIVE
Unit division: 0

## 2013-03-27 LAB — TROPONIN I
Troponin I: <0.3 ng/mL (ref <0.30)
Troponin I: 0.31 ng/mL (ref <0.30)

## 2013-03-27 LAB — POCT ACTIVATED CLOTTING TIME
ACTIVATED CLOTTING TIME: 149 s
Activated Clotting Time: 143 seconds
Activated Clotting Time: 149 seconds
Activated Clotting Time: 160 seconds
Activated Clotting Time: 154 seconds
Activated Clotting Time: 154 seconds
Activated Clotting Time: 155 seconds

## 2013-03-27 LAB — POCT I-STAT 3, VENOUS BLOOD GAS (G3P V)
Acid-base deficit: 5 mmol/L — ABNORMAL HIGH (ref 0.0–2.0)
Bicarbonate: 20.2 mEq/L (ref 20.0–24.0)
O2 SAT: 73 %
PO2 VEN: 39 mmHg (ref 30.0–45.0)
TCO2: 21 mmol/L (ref 0–100)
pCO2, Ven: 37.6 mmHg — ABNORMAL LOW (ref 45.0–50.0)
pH, Ven: 7.336 — ABNORMAL HIGH (ref 7.250–7.300)

## 2013-03-27 LAB — RENAL FUNCTION PANEL
Albumin: 2.4 g/dL — ABNORMAL LOW (ref 3.5–5.2)
BUN: 86 mg/dL — AB (ref 6–23)
CALCIUM: 8.8 mg/dL (ref 8.4–10.5)
CO2: 18 meq/L — AB (ref 19–32)
CREATININE: 3.79 mg/dL — AB (ref 0.50–1.10)
Chloride: 103 mEq/L (ref 96–112)
GFR calc Af Amer: 16 mL/min — ABNORMAL LOW (ref >90)
GFR calc non Af Amer: 14 mL/min — ABNORMAL LOW (ref >90)
Glucose, Bld: 160 mg/dL — ABNORMAL HIGH (ref 70–99)
PHOSPHORUS: 5.2 mg/dL — AB (ref 2.3–4.6)
Potassium: 3.7 mEq/L (ref 3.7–5.3)
SODIUM: 139 meq/L (ref 137–147)

## 2013-03-27 LAB — GLUCOSE, CAPILLARY
GLUCOSE-CAPILLARY: 149 mg/dL — AB (ref 70–99)
GLUCOSE-CAPILLARY: 224 mg/dL — AB (ref 70–99)
Glucose-Capillary: 259 mg/dL — ABNORMAL HIGH (ref 70–99)
Glucose-Capillary: 278 mg/dL — ABNORMAL HIGH (ref 70–99)
Glucose-Capillary: 188 mg/dL — ABNORMAL HIGH (ref 70–99)
Glucose-Capillary: 177 mg/dL — ABNORMAL HIGH (ref 70–99)
Glucose-Capillary: 167 mg/dL — ABNORMAL HIGH (ref 70–99)
Glucose-Capillary: 154 mg/dL — ABNORMAL HIGH (ref 70–99)

## 2013-03-27 LAB — MAGNESIUM: Magnesium: 2.1 mg/dL (ref 1.5–2.5)

## 2013-03-27 LAB — LACTIC ACID, PLASMA: Lactic Acid, Venous: 1.1 mmol/L (ref 0.5–2.2)

## 2013-03-27 LAB — PROCALCITONIN: Procalcitonin: 3.61 ng/mL

## 2013-03-27 LAB — PHOSPHORUS: PHOSPHORUS: 5.8 mg/dL — AB (ref 2.3–4.6)

## 2013-03-27 MED ORDER — HEPARIN SODIUM (PORCINE) 1000 UNIT/ML DIALYSIS
1000.0000 [IU] | INTRAMUSCULAR | Status: DC | PRN
  Filled 2013-03-27: qty 6

## 2013-03-27 MED ORDER — SODIUM CHLORIDE 0.9 % IV SOLN
0.0000 ug/h | INTRAVENOUS | Status: DC
  Administered 2013-03-27: 300 ug/h via INTRAVENOUS
  Administered 2013-03-27: 200 ug/h via INTRAVENOUS
  Administered 2013-03-27: 75 ug/h via INTRAVENOUS
  Administered 2013-03-27: 50 ug/h via INTRAVENOUS
  Administered 2013-03-28: 200 ug/h via INTRAVENOUS
  Filled 2013-03-27 (×3): qty 50

## 2013-03-27 MED ORDER — MIDAZOLAM HCL 2 MG/2ML IJ SOLN
2.0000 mg | INTRAMUSCULAR | Status: DC | PRN
  Administered 2013-03-27 (×2): 2 mg via INTRAVENOUS
  Filled 2013-03-27 (×2): qty 2

## 2013-03-27 MED ORDER — MIDAZOLAM HCL 2 MG/2ML IJ SOLN
2.0000 mg | INTRAMUSCULAR | Status: DC | PRN
  Administered 2013-03-27: 2 mg via INTRAVENOUS
  Filled 2013-03-27: qty 2

## 2013-03-27 MED ORDER — LORAZEPAM 2 MG/ML IJ SOLN
2.0000 mg | Freq: Once | INTRAMUSCULAR | Status: AC
  Administered 2013-03-27: 2 mg via INTRAVENOUS

## 2013-03-27 MED ORDER — SODIUM BICARBONATE 8.4 % IV SOLN
INTRAVENOUS | Status: AC
  Administered 2013-03-27: 50 meq
  Filled 2013-03-27: qty 50

## 2013-03-27 MED ORDER — CHLORHEXIDINE GLUCONATE 0.12 % MT SOLN: 15.0000 mL | Freq: Two times a day (BID) | OROMUCOSAL | Status: DC

## 2013-03-27 MED ORDER — ROCURONIUM BROMIDE 50 MG/5ML IV SOLN
INTRAVENOUS | Status: AC
Start: 2013-03-27 — End: 2013-03-27
  Administered 2013-03-27: 10 mg
  Filled 2013-03-27: qty 2

## 2013-03-27 MED ORDER — LIDOCAINE HCL (CARDIAC) 20 MG/ML IV SOLN
INTRAVENOUS | Status: AC
  Filled 2013-03-27: qty 5

## 2013-03-27 MED ORDER — BIOTENE DRY MOUTH MT LIQD: 15.0000 mL | Freq: Four times a day (QID) | OROMUCOSAL | Status: DC

## 2013-03-27 MED ORDER — PANTOPRAZOLE SODIUM 40 MG IV SOLR
40.0000 mg | Freq: Every day | INTRAVENOUS | Status: DC
  Administered 2013-03-27 – 2013-03-28 (×2): 40 mg via INTRAVENOUS
  Filled 2013-03-27 (×2): qty 40

## 2013-03-27 MED ORDER — PRISMASOL BGK 4/2.5 32-4-2.5 MEQ/L IV SOLN
INTRAVENOUS | Status: DC
  Administered 2013-03-27 – 2013-03-29 (×4): via INTRAVENOUS_CENTRAL
  Filled 2013-03-27 (×6): qty 5000

## 2013-03-27 MED ORDER — FENTANYL CITRATE 0.05 MG/ML IJ SOLN
INTRAMUSCULAR | Status: AC
  Administered 2013-03-27: 200 ug
  Filled 2013-03-27: qty 4

## 2013-03-27 MED ORDER — HEPARIN (PORCINE) 2000 UNITS/L FOR CRRT
INTRAVENOUS_CENTRAL | Status: DC | PRN
  Filled 2013-03-27: qty 1000

## 2013-03-27 MED ORDER — FENTANYL CITRATE 0.05 MG/ML IJ SOLN: 100.0000 ug | INTRAMUSCULAR | Status: DC | PRN

## 2013-03-27 MED ORDER — ETOMIDATE 2 MG/ML IV SOLN
0.3000 mg/kg | Freq: Once | INTRAVENOUS | Status: AC
  Administered 2013-03-27: 20 mg via INTRAVENOUS

## 2013-03-27 MED ORDER — CHLORHEXIDINE GLUCONATE 0.12 % MT SOLN
OROMUCOSAL | Status: AC
  Filled 2013-03-27: qty 15

## 2013-03-27 MED ORDER — CHLORHEXIDINE GLUCONATE 0.12 % MT SOLN
15.0000 mL | Freq: Two times a day (BID) | OROMUCOSAL | Status: DC
  Administered 2013-03-27 – 2013-04-02 (×15): 15 mL via OROMUCOSAL
  Filled 2013-03-27 (×13): qty 15

## 2013-03-27 MED ORDER — MIDAZOLAM HCL 2 MG/2ML IJ SOLN
INTRAMUSCULAR | Status: AC
  Administered 2013-03-27: 2 mg via INTRAVENOUS
  Filled 2013-03-27: qty 4

## 2013-03-27 MED ORDER — PRISMASOL BGK 4/2.5 32-4-2.5 MEQ/L IV SOLN
INTRAVENOUS | Status: DC
  Administered 2013-03-27 – 2013-03-29 (×3): via INTRAVENOUS_CENTRAL
  Filled 2013-03-27 (×6): qty 5000

## 2013-03-27 MED ORDER — INSULIN ASPART 100 UNIT/ML ~~LOC~~ SOLN
0.0000 [IU] | SUBCUTANEOUS | Status: DC
  Administered 2013-03-27 (×2): 4 [IU] via SUBCUTANEOUS
  Administered 2013-03-27: 11 [IU] via SUBCUTANEOUS
  Administered 2013-03-27: 4 [IU] via SUBCUTANEOUS
  Administered 2013-03-27: 11 [IU] via SUBCUTANEOUS
  Administered 2013-03-27 – 2013-03-28 (×3): 3 [IU] via SUBCUTANEOUS
  Administered 2013-03-28: 4 [IU] via SUBCUTANEOUS
  Administered 2013-03-28 – 2013-03-29 (×2): 3 [IU] via SUBCUTANEOUS
  Administered 2013-03-29 (×2): 2 [IU] via SUBCUTANEOUS
  Administered 2013-03-29 (×2): 3 [IU] via SUBCUTANEOUS
  Administered 2013-03-30 (×2): 4 [IU] via SUBCUTANEOUS
  Administered 2013-03-30: 3 [IU] via SUBCUTANEOUS
  Administered 2013-03-30: 4 [IU] via SUBCUTANEOUS
  Administered 2013-03-30: 7 [IU] via SUBCUTANEOUS
  Administered 2013-03-30: 4 [IU] via SUBCUTANEOUS
  Administered 2013-03-31 (×3): 3 [IU] via SUBCUTANEOUS
  Administered 2013-03-31: 4 [IU] via SUBCUTANEOUS
  Administered 2013-03-31 – 2013-04-01 (×2): 3 [IU] via SUBCUTANEOUS
  Administered 2013-04-01 (×3): 4 [IU] via SUBCUTANEOUS
  Administered 2013-04-01: 3 [IU] via SUBCUTANEOUS
  Administered 2013-04-01 – 2013-04-02 (×5): 4 [IU] via SUBCUTANEOUS
  Administered 2013-04-02: 3 [IU] via SUBCUTANEOUS
  Administered 2013-04-03: 7 [IU] via SUBCUTANEOUS
  Administered 2013-04-03: 3 [IU] via SUBCUTANEOUS
  Administered 2013-04-03: 4 [IU] via SUBCUTANEOUS

## 2013-03-27 MED ORDER — HEPARIN BOLUS VIA INFUSION (CRRT)
1000.0000 [IU] | INTRAVENOUS | Status: DC | PRN
  Administered 2013-03-27 – 2013-03-28 (×3): 1000 [IU] via INTRAVENOUS_CENTRAL
  Filled 2013-03-27: qty 1000

## 2013-03-27 MED ORDER — SODIUM CHLORIDE 0.9 % IJ SOLN
250.0000 [IU]/h | INTRAMUSCULAR | Status: DC
  Administered 2013-03-27: 1250 [IU]/h via INTRAVENOUS_CENTRAL
  Administered 2013-03-27: 250 [IU]/h via INTRAVENOUS_CENTRAL
  Administered 2013-03-28: 2500 [IU]/h via INTRAVENOUS_CENTRAL
  Administered 2013-03-28: 2450 [IU]/h via INTRAVENOUS_CENTRAL
  Administered 2013-03-28: 2250 [IU]/h via INTRAVENOUS_CENTRAL
  Administered 2013-03-28: 2200 [IU]/h via INTRAVENOUS_CENTRAL
  Administered 2013-03-28: 1850 [IU]/h via INTRAVENOUS_CENTRAL
  Administered 2013-03-29: 2400 [IU]/h via INTRAVENOUS_CENTRAL
  Administered 2013-03-29: 2500 [IU]/h via INTRAVENOUS_CENTRAL
  Administered 2013-03-29 (×3): 2400 [IU]/h via INTRAVENOUS_CENTRAL
  Administered 2013-03-29: 2500 [IU]/h via INTRAVENOUS_CENTRAL
  Administered 2013-03-30: 2350 [IU]/h via INTRAVENOUS_CENTRAL
  Administered 2013-03-30 (×2): 2400 [IU]/h via INTRAVENOUS_CENTRAL
  Filled 2013-03-27 (×18): qty 2

## 2013-03-27 MED ORDER — ASPIRIN 81 MG PO CHEW
81.0000 mg | CHEWABLE_TABLET | Freq: Every day | ORAL | Status: DC
  Administered 2013-03-27 – 2013-04-13 (×17): 81 mg via ORAL
  Filled 2013-03-27 (×16): qty 1

## 2013-03-27 MED ORDER — PRISMASOL BGK 4/2.5 32-4-2.5 MEQ/L IV SOLN
INTRAVENOUS | Status: DC
  Administered 2013-03-27 – 2013-03-30 (×20): via INTRAVENOUS_CENTRAL
  Filled 2013-03-27 (×26): qty 5000

## 2013-03-27 MED ORDER — FENTANYL CITRATE 0.05 MG/ML IJ SOLN
50.0000 ug | Freq: Once | INTRAMUSCULAR | Status: AC
  Administered 2013-03-27: 50 ug via INTRAVENOUS

## 2013-03-27 MED ORDER — SUCCINYLCHOLINE CHLORIDE 20 MG/ML IJ SOLN
INTRAMUSCULAR | Status: AC
  Filled 2013-03-27: qty 1

## 2013-03-27 MED ORDER — BIOTENE DRY MOUTH MT LIQD
15.0000 mL | Freq: Four times a day (QID) | OROMUCOSAL | Status: DC
  Administered 2013-03-27 – 2013-03-28 (×4): 15 mL via OROMUCOSAL

## 2013-03-27 MED ORDER — FENTANYL BOLUS VIA INFUSION
50.0000 ug | INTRAVENOUS | Status: DC | PRN
  Filled 2013-03-27: qty 100

## 2013-03-27 MED ORDER — ETOMIDATE 2 MG/ML IV SOLN
INTRAVENOUS | Status: AC
  Administered 2013-03-27: 20 mg
  Filled 2013-03-27: qty 20

## 2013-03-27 MED ORDER — MIDAZOLAM HCL 2 MG/2ML IJ SOLN
2.0000 mg | INTRAMUSCULAR | Status: DC | PRN
  Administered 2013-03-27 (×3): 2 mg via INTRAVENOUS
  Filled 2013-03-27: qty 2

## 2013-03-27 MED ORDER — DARBEPOETIN ALFA-POLYSORBATE 100 MCG/0.5ML IJ SOLN
100.0000 ug | INTRAMUSCULAR | Status: DC
  Administered 2013-03-27 – 2013-04-10 (×3): 100 ug via INTRAVENOUS
  Filled 2013-03-27 (×4): qty 0.5

## 2013-03-27 MED ORDER — MIDAZOLAM HCL 2 MG/2ML IJ SOLN
2.0000 mg | INTRAMUSCULAR | Status: DC | PRN
  Filled 2013-03-27: qty 2

## 2013-03-27 NOTE — Procedures (Signed)
Supervised procedure at bedside - done by NP. Easy procedure. First attempt with glidesscope. No aspiration  Dr. Kalman ShanMurali Alyanna Stoermer, M.D., [facility] Pulmonary and Critical Care Medicine Staff Physician [facility] [place] [facility] Pager: [phone], If no answer or between  15:00h - 7:00h: call [phone]  03/27/2013 1:37 AM

## 2013-03-27 NOTE — Progress Notes (Signed)
eLink Physician-Brief Progress Note Patient Name: Carianne L Meras DOB: 01-Aug-1970 MRN: [id number]  Date of Service  03/27/2013   HPI/Events of Note  Call from nurse requesting assistance with patient who is anxious on BiPAP.  Camera check shows patient on BiPAP with sats in the mid to high 90s  With RR of 40 to 50s.  She is tachycardiac and hypertensive.  She appears uncomfortable.  ABG obtained at 1605 was 7.42/22/55/14 at which time BiPAP initiated.  Patient also with CRI with an AG related to that.  Had DKA complicating this presentation earlier but now ketones negative.  Currently on dextrose containing fluid at 50 cc/hr with goal to diurese.   eICU Interventions  Plan: Ativan 2 mg IV for anxiety Continue to monitor closely - may require intubation D/C dextrose fluids - patient is no longer in DKA but has AG [facility] with goal net negative diuresis   Intervention Category Intermediate Interventions: Respiratory distress - evaluation and management  Keitha Kolk 03/27/2013, 12:07 AM

## 2013-03-27 NOTE — Procedures (Signed)
Intubation Procedure Note Jasmine [name] [id number] 30-Sep-1970  Procedure: Intubation Indications: Respiratory insufficiency  Procedure Details Consent: Unable to obtain consent because of emergent medical necessity. Time Out: Verified patient identification, verified procedure, site/side was marked, verified correct patient position, special equipment/implants available, medications/allergies/relevent history reviewed, required imaging and test results available.  Performed  Maximum sterile technique was used including antiseptics, cap, gloves, hand hygiene and mask.  MAC 3 glide scope     Evaluation Hemodynamic Status: BP stable throughout; O2 sats: stable throughout Patient's Current Condition: stable Complications: No apparent complications Patient did tolerate procedure well. Chest X-ray ordered to verify placement.  CXR: pending.   Daved Mcfann,PETE 03/27/2013

## 2013-03-27 NOTE — Progress Notes (Signed)
Name: Jasmine [name] MRN: [id number] DOB: [birth year]    ADMISSION DATE:  03/23/2013 CONSULTATION DATE:  03/27/2013  REFERRING MD :  [facility] PRIMARY SERVICE: [facility]  CHIEF COMPLAINT:  sob  BRIEF PATIENT DESCRIPTION: 43 y.o with poorly controlled insulin-dependent diabetes mellitus adm 2/27 with CBG 446 , AG 19 , left buttock abscess requiring I& D & WC 21,800. Treated as DKA & sepsis with vanmc/ zosyn. Developed increasing hypoxia on 2/28 progressing to NRB by 3/1, hence PCCM consulted PMH - non obs CAD cath 2010, Htn , stg 3 CKD  SIGNIFICANT EVENTS / STUDIES:  2/28 venti mask 3/1 NRB 3/1 Ct abdomen >> ASD BLL, small BL effusions, IUD 2/27 Incision and drainage of left buttock abscess 3/3 required intubation 3/3 tx to [facility] for possible crrt/hd  LINES / TUBES: 3/3 ott>> 3/3 rt i j hd cath>>  CULTURES: 2/27 bld >> ng 2/27 wound >> proteus, pan Sens  ANTIBIOTICS: 2/27 vanc >>  2/27 zosyn >> 3/1 levaquin>>  HISTORY OF PRESENT ILLNESS:  43 y.o with poorly controlled insulin-dependent diabetes mellitus adm 2/27 with CBG 446 , AG 19 , left buttock abscess requiring I& D & WC 21,800. Treated as DKA & sepsis with vanmc/ zosyn. Developed increasing hypoxia on 2/28 progressing to NRB by 3/1, hence PCCM consulted Placed on insulin gtt, creat remained elevated at 2.9  Currently reports dyspnea & severe anxiety, episode of resp failure in 09/2012 with some degree of PTSD  SUBJECTIVE:  Intubated during the night. Worsening acidosis, non responsive to high dose diuretics.   VITAL SIGNS: Temp:  [97.9 F (36.6 C)-99.7 F (37.6 C)] 97.9 F (36.6 C) (03/03 0800) Pulse Rate:  [30-128] 79 (03/03 0930) Resp:  [21-50] 30 (03/03 0930) BP: (80-196)/(48-159) 119/57 mmHg (03/03 0930) SpO2:  [85 %-100 %] 98 % (03/03 0930) FiO2 (%):  [60 %-100 %] 70 % (03/03 0839) Weight:  [131.1 kg (289 lb 0.4 oz)] 131.1 kg (289 lb 0.4 oz) (03/03 0400) HEMODYNAMICS:   VENTILATOR SETTINGS: Vent Mode:  [-]  PRVC FiO2 (%):  [60 %-100 %] 70 % Set Rate:  [30 bmp] 30 bmp Vt Set:  [470 mL-550 mL] 550 mL PEEP:  [8 cmH20] 8 cmH20 Plateau Pressure:  [26 cmH20-28 cmH20] 26 cmH20 INTAKE / OUTPUT: Intake/Output     03/02 0701 - 03/03 0700 03/03 0701 - 03/04 0700   P.O. 340    I.V. (mL/kg) 713.3 (5.4) 36.7 (0.3)   Blood 262.5    IV Piggyback 407.5    Total Intake(mL/kg) 1723.3 (13.1) 36.7 (0.3)   Urine (mL/kg/hr) 1190 (0.4) 10 (0)   Total Output 1190 10   Net +533.3 +26.7          PHYSICAL EXAMINATION: Gen. Sedated on vent ENT - OTT-> vent  Neck: No JVD, no thyromegaly, no carotid bruits Lungs: decreased bs bases, mild rhonchi Cardiovascular: Rhythm regular, heart sounds  normal, no murmurs, no peripheral edema Abdomen: soft and non-tender,  BS decreased. Musculoskeletal: No deformities, no cyanosis or clubbing Neuro:  Sedated on vent Skin:  Warm, no lesions/ rash   LABS:  CBC  Recent Labs Lab 03/25/13 1005 03/26/13 0635 03/27/13 0330  WBC 18.4* 15.9* 14.2*  HGB 7.4* 6.6* 7.0*  HCT 21.8* 19.6* 20.6*  PLT 211 224 238   Coag's  Recent Labs Lab 03/23/13 1919  INR 1.22   BMET  Recent Labs Lab 03/26/13 2008 03/27/13 0016 03/27/13 0330  NA 134* 134* 135*  K 4.0 3.8 3.6*  CL  98 97 98  CO2 12* 13* 14*  BUN 82* 79* 83*  CREATININE 3.43* 3.42* 3.65*  GLUCOSE 264* 248* 296*   Electrolytes  Recent Labs Lab 03/26/13 2008 03/27/13 0016 03/27/13 0330  CALCIUM 9.1 9.0 8.7  MG  --   --  2.1  PHOS  --   --  5.8*   Sepsis Markers  Recent Labs Lab 03/23/13 1317 03/25/13 1610 03/25/13 1611 03/26/13 0635 03/27/13 0330  LATICACIDVEN 2.53* 1.0  --   --   --   PROCALCITON  --   --  3.25 4.04 3.61   ABG  Recent Labs Lab 03/27/13 0051 03/27/13 0216 03/27/13 0351  PHART 7.332* 7.244* 7.304*  PCO2ART 27.0* 34.6* 28.1*  PO2ART 88.5 83.5 125.0*   Liver Enzymes No results found for this basename: AST, ALT, ALKPHOS, BILITOT, ALBUMIN,  in the last 168  hours Cardiac Enzymes  Recent Labs Lab 03/24/13 1200 03/24/13 1826 03/27/13 0330 03/27/13 0825  TROPONINI <0.30 <0.30 <0.30 0.31*  PROBNP 6479.0*  --   --   --    Glucose  Recent Labs Lab 03/26/13 1620 03/26/13 1959 03/27/13 0026 03/27/13 0219 03/27/13 0333 03/27/13 0800  GLUCAP 230* 251* 224* 259* 278* 188*    Imaging Ct Abdomen Pelvis Wo Contrast  03/25/2013   CLINICAL DATA:  DKA. Question intra-abdominal infection/pelvic abscess. Leukocytosis. Question of IV contrast allergy.  EXAM: CT ABDOMEN AND PELVIS WITHOUT CONTRAST  TECHNIQUE: Multidetector CT imaging of the abdomen and pelvis was performed following the standard protocol without intravenous contrast.  COMPARISON:  10/19/2012 and 08/05/2009  FINDINGS: Lung bases demonstrate moderate consolidation with air bronchograms over the posterior lower lobes right worse than left likely a pneumonia. Small bilateral pleural effusions are present. Heart is normal in size.  Abdominal images demonstrate evidence of a prior cholecystectomy. There are a few small calcified splenic granulomas. Liver is within normal. The pancreas and adrenal glands are normal. Kidneys are normal in size without evidence of hydronephrosis or nephrolithiasis. Ureters are within normal. Appendix is normal. There is mild calcified plaque over the abdominal aorta and iliac vessels.  Pelvic images demonstrate a Foley catheter present within a decompressed bladder. An IUD is in adequate position over the uterine fundus. There is evidence of several uterine fibroids unchanged. There are bilateral simple cystic appearing ovaries. No free fluid in the pelvis. No evidence of adenopathy. There is mild degenerative change of the hips.  IMPRESSION: Airspace consolidation over the posterior lower lobes right worse than left with air bronchograms suggesting multifocal pneumonia. Small bilateral pleural effusions.  No acute findings in the abdomen/pelvis.  Fibroid uterus unchanged.  Simple cystic bilateral ovaries. IUD in adequate position.   Electronically Signed   By: Elberta Fortisaniel  Boyle M.D.   On: 03/25/2013 14:37   Dg Chest Port 1 View  03/27/2013   CLINICAL DATA:  Central line placed  EXAM: PORTABLE CHEST - 1 VIEW  COMPARISON:  Chest x-ray from the same day at 1:50 a.m.  FINDINGS: Endotracheal tube has been retracted, now in the mid thoracic trachea. Orogastric tube at least reaches the diaphragm. New right IJ dual-lumen catheter, tip at the level of the SVC. No evidence of pneumothorax or increased pleural fluid. Unchanged diffuse interstitial and airspace disease. There may be an layering effusions.  IMPRESSION: 1. New right IJ catheter.  No adverse features. 2. Endotracheal and orogastric tubes in good position. 3. Unchanged diffuse airspace disease.   Electronically Signed   By: Audry RilesJonathan  Watts M.D.  On: 03/27/2013 05:07   Dg Chest Port 1 View  03/27/2013   CLINICAL DATA:  Endotracheal tube placement.  EXAM: PORTABLE CHEST - 1 VIEW  COMPARISON:  03/25/2013.  FINDINGS: Endotracheal tube tip just above the carina. Recommend retracted by 2.5 cm.  Nasogastric tube courses below the diaphragm. Tip is not included on the present exam.  Worsening of diffuse airspace disease approaching ARDS pattern.  Given the diffuse pulmonary disease limited evaluation of the mediastinal and cardiac silhouette.  No gross pneumothorax.  IMPRESSION: Endotracheal tube tip just above the carina. Recommend retracted by 2.5 cm.  Nasogastric tube courses below the diaphragm. Tip is not included on the present exam.  Worsening of diffuse airspace disease approaching ARDS pattern.  These results were called by telephone at the time of interpretation on 03/27/2013 at 2:20 AM to [facility], patient's nurse , who verbally acknowledged these results.   Electronically Signed   By: Bridgett Larsson M.D.   On: 03/27/2013 02:21   Dg Chest Port 1 View  03/25/2013   CLINICAL DATA:  Shortness breath.  EXAM: PORTABLE CHEST - 1 VIEW   COMPARISON:  03/24/2013 chest x-ray. 03/25/2013 CT abdomen and pelvis.  FINDINGS: The CT detected basilar consolidation (more notable on the right) and small pleural effusions better appreciated on recent abdominal CT.  Pulmonary vascular congestion/mild pulmonary edema is similar to prior exam.  Cardiomegaly.  No gross pneumothorax.  IMPRESSION: The CT detected basilar consolidation (more notable on the right) and small pleural effusions better appreciated on recent abdominal CT.  Pulmonary vascular congestion/mild pulmonary edema is similar to prior exam.  Cardiomegaly.   Electronically Signed   By: Bridgett Larsson M.D.   On: 03/25/2013 17:38    ASSESSMENT / PLAN:  PULMONARY A:Acute resp failure, hypoxic and due to metabolic acidosis B infiltrates, ARDS pattern P:   Change now to ARDS protocol, tolerate some relative hypercapnia (she has hyperventilated to compensate for metabolic acidosis) Follow CXR Diuresis as she can tolerate, she will require HD for volume removal Follow ABG Tx to [facility] in case HFO needed before pul status worsens.  CARDIOVASCULAR A: Diastolic heart failure Diffuse B infiltrates, may reflect cardiogenic edema (vs ALI) P:  Lasix 120 IV. Per renal plans Ct hydralazine & nitrates  RENAL Lab Results  Component Value Date   CREATININE 3.65* 03/27/2013   CREATININE 3.42* 03/27/2013   CREATININE 3.43* 03/26/2013   A:  AKI on CKD , baseline cr 2.0 range AG and non-AG metab acidosis, initially may have been due to DKA but suspect that this is now largely due to renal failure P:   Lasix 120 IV q 8h per renal > ineffective, 35cc since 0700 Could consider bicarb for non-AG component acidosis Tx to [facility] for HD  GASTROINTESTINAL A:  No issues P:   Start TF next 24-48h  HEMATOLOGIC  Recent Labs  03/26/13 0635 03/27/13 0330  HGB 6.6* 7.0*    A:  Anemia P:  Transfuse for Hgb < 7.0  INFECTIOUS A:  Left buttock abscess. Proteus pan ss. BC pending 3/2 Consider HCAP,  although unlikely P:   Ct zosyn/ vanc/levaquin for now, covering abscess as well as possible HCAP (doubt). Plan to narrow soon  ENDOCRINE CBG (last 3)   Recent Labs  03/27/13 0219 03/27/13 0333 03/27/13 0800  GLUCAP 259* 278* 188*     A:   DM Presumed DKA (no ketones ) on presentation, now high AG due to renal failure  (most likely) P:  D/c insulin gtt and D10 on 3/2 lantus Follow CBG   NEUROLOGIC A:  Anxiety  Sedation P:   Sedation per protocol   Global: Tx to [facility] before her pulmonary status worsens and unable to transport for HD/CRRT.  50 minutes CC time  [facility] Minor ACNP [facility] PCCM Pager [phone] till 3 pm If no answer page [phone] 03/27/2013, 10:02 AM  Levy Pupa, MD, PhD 03/27/2013, 11:06 AM [place] [facility] [phone] or if no answer [phone]

## 2013-03-27 NOTE — Progress Notes (Signed)
[facility] due to increasing complications and decreasing stablization.

## 2013-03-27 NOTE — Progress Notes (Signed)
CRITICAL VALUE ALERT  Critical value received:  Troponin 0.31  Date of notification:  03/27/13  Time of notification:  0900  Critical value read back:yes  Nurse who received alert:  Lorelle FormosaM Ervin Hensley  MD notified (1st page):  S. Minor NP  Time of first page:  0930  MD notified (2nd page):  Time of second page:  Responding MD:    Time MD responded:

## 2013-03-27 NOTE — Procedures (Signed)
Central Venous Catheter Insertion Procedure Note Jasmine [name] [id number] 02-22-1970  Procedure: Insertion of Central Venous hemodialysis  Catheter Indications: Assessment of intravascular volume, Drug and/or fluid administration and Frequent blood sampling  Procedure Details Consent: Unable to obtain consent because of emergent medical necessity. Time Out: Verified patient identification, verified procedure, site/side was marked, verified correct patient position, special equipment/implants available, medications/allergies/relevent history reviewed, required imaging and test results available.  Performed Real time US used to ID and cannulate the vessel  Maximum sterile technique was used including antiseptics, cap, gloves, gown, hand hygiene, mask and sheet. Skin prep: Chlorhexidine; local anesthetic administered A antimicrobial bonded/coated triple lumen catheter was placed in the right internal jugular vein using the Seldinger technique.  Evaluation Blood flow good Complications: No apparent complications Patient did tolerate procedure well. Chest X-ray ordered to verify placement.  CXR: pending.  Jasmine [name],PETE 03/27/2013, 3:14 AM

## 2013-03-27 NOTE — Progress Notes (Signed)
Name: Jasmine [name] MRN: [id number] DOB: November 20, 1970    ADMISSION DATE:  03/23/2013 CONSULTATION DATE:  03/27/2013  REFERRING MD :  [facility] PRIMARY SERVICE: triad  CHIEF COMPLAINT:  sob  BRIEF PATIENT DESCRIPTION: 43 y.o with poorly controlled insulin-dependent diabetes mellitus adm 2/27 with CBG 446 , AG 19 , left buttock abscess requiring I& D & WC 21,800. Treated as DKA & sepsis with vanmc/ zosyn. Developed increasing hypoxia on 2/28 progressing to NRB by 3/1, hence PCCM consulted   PMH - non obs CAD cath 2010, Htn , stg 3 CKD  SIGNIFICANT EVENTS / STUDIES:  2/28 venti mask 3/1 NRB 3/1 Ct abdomen >> ASD BLL, small BL effusions, IUD 2/27 Incision and drainage of left buttock abscess 3/3: worsening work of breathing. Intubated   LINES / TUBES: OETT 3/3>>>  CULTURES: 2/27 bld >> ng 2/27 wound >> proteus, pan Sens  ANTIBIOTICS: 2/27 vanc >>  2/27 zosyn >>  HISTORY OF PRESENT ILLNESS:  44 y.o with poorly controlled insulin-dependent diabetes mellitus adm 2/27 with CBG 446 , AG 19 , left buttock abscess requiring I& D & WC 21,800. Treated as DKA & sepsis with vanmc/ zosyn. Developed increasing hypoxia on 2/28 progressing to NRB by 3/1, hence PCCM consulted Placed on insulin gtt, creat remained elevated at 2.9  Currently reports dyspnea & severe anxiety, episode of resp failure in 09/2012 with some degree of PTSD   SUBJECTIVE:  On 70% NRB sats 92%. Alert and orientated   VITAL SIGNS: Temp:  [98.4 F (36.9 C)-101.6 F (38.7 C)] 99.7 F (37.6 C) (03/02 2304) Pulse Rate:  [81-110] 110 (03/03 0000) Resp:  [25-50] 49 (03/03 0000) BP: (110-173)/(48-80) 173/64 mmHg (03/03 0000) SpO2:  [88 %-99 %] 97 % (03/03 0000) FiO2 (%):  [60 %-100 %] 60 % (03/03 0000) HEMODYNAMICS:   VENTILATOR SETTINGS: Vent Mode:  [-]  FiO2 (%):  [60 %-100 %] 60 % INTAKE / OUTPUT: Intake/Output     03/02 0701 - 03/03 0700   P.O. 340   I.V. (mL/kg) 580.9 (4.4)   Blood 262.5   IV Piggyback 320    Total Intake(mL/kg) 1503.4 (11.5)   Urine (mL/kg/hr) 920 (0.3)   Total Output 920   Net +583.4         PHYSICAL EXAMINATION: Gen: Currently in acute distress, tachypneic  ENT - no lesions, On NIPPV Neck: No JVD, no thyromegaly, no carotid bruits Lungs: decreased bs bases Cardiovascular: Rhythm regular, heart sounds  normal, no murmurs, no peripheral edema Abdomen: soft and non-tender,  BS normal. Musculoskeletal: No deformities, no cyanosis or clubbing Neuro:  alert, non focal Skin:  Warm, no lesions/ rash   LABS:  CBC  Recent Labs Lab 03/23/13 1919 03/25/13 1005 03/26/13 0635  WBC 20.0* 18.4* 15.9*  HGB 8.4* 7.4* 6.6*  HCT 25.2* 21.8* 19.6*  PLT 223 211 224   Coag's  Recent Labs Lab 03/23/13 1919  INR 1.22   BMET  Recent Labs Lab 03/26/13 1512 03/26/13 1741 03/26/13 2008  NA 136* 136* 134*  K 3.8 4.1 4.0  CL 99 100 98  CO2 13* 13* 12*  BUN 76* 82* 82*  CREATININE 3.36* 3.42* 3.43*  GLUCOSE 235* 259* 264*   Electrolytes  Recent Labs Lab 03/26/13 1512 03/26/13 1741 03/26/13 2008  CALCIUM 8.9 9.0 9.1   Sepsis Markers  Recent Labs Lab 03/23/13 1317 03/25/13 1610 03/25/13 1611 03/26/13 0635  LATICACIDVEN 2.53* 1.0  --   --   PROCALCITON  --   --  3.25 4.04   ABG  Recent Labs Lab 03/25/13 0548 03/25/13 1605  PHART 7.392 7.425  PCO2ART 26.4* 22.8*  PO2ART 73.5* 55.6*   Liver Enzymes No results found for this basename: AST, ALT, ALKPHOS, BILITOT, ALBUMIN,  in the last 168 hours Cardiac Enzymes  Recent Labs Lab 03/24/13 0700 03/24/13 1200 03/24/13 1826  TROPONINI <0.30 <0.30 <0.30  PROBNP  --  6479.0*  --    Glucose  Recent Labs Lab 03/26/13 0954 03/26/13 1105 03/26/13 1238 03/26/13 1620 03/26/13 1959 03/27/13 0026  GLUCAP 167* 178* 182* 230* 251* 224*    Imaging Ct Abdomen Pelvis Wo Contrast  03/25/2013   CLINICAL DATA:  DKA. Question intra-abdominal infection/pelvic abscess. Leukocytosis. Question of IV  contrast allergy.  EXAM: CT ABDOMEN AND PELVIS WITHOUT CONTRAST  TECHNIQUE: Multidetector CT imaging of the abdomen and pelvis was performed following the standard protocol without intravenous contrast.  COMPARISON:  10/19/2012 and 08/05/2009  FINDINGS: Lung bases demonstrate moderate consolidation with air bronchograms over the posterior lower lobes right worse than left likely a pneumonia. Small bilateral pleural effusions are present. Heart is normal in size.  Abdominal images demonstrate evidence of a prior cholecystectomy. There are a few small calcified splenic granulomas. Liver is within normal. The pancreas and adrenal glands are normal. Kidneys are normal in size without evidence of hydronephrosis or nephrolithiasis. Ureters are within normal. Appendix is normal. There is mild calcified plaque over the abdominal aorta and iliac vessels.  Pelvic images demonstrate a Foley catheter present within a decompressed bladder. An IUD is in adequate position over the uterine fundus. There is evidence of several uterine fibroids unchanged. There are bilateral simple cystic appearing ovaries. No free fluid in the pelvis. No evidence of adenopathy. There is mild degenerative change of the hips.  IMPRESSION: Airspace consolidation over the posterior lower lobes right worse than left with air bronchograms suggesting multifocal pneumonia. Small bilateral pleural effusions.  No acute findings in the abdomen/pelvis.  Fibroid uterus unchanged. Simple cystic bilateral ovaries. IUD in adequate position.   Electronically Signed   By: Elberta Fortisaniel  Boyle M.D.   On: 03/25/2013 14:37   Dg Chest Port 1 View  03/25/2013   CLINICAL DATA:  Shortness breath.  EXAM: PORTABLE CHEST - 1 VIEW  COMPARISON:  03/24/2013 chest x-ray. 03/25/2013 CT abdomen and pelvis.  FINDINGS: The CT detected basilar consolidation (more notable on the right) and small pleural effusions better appreciated on recent abdominal CT.  Pulmonary vascular congestion/mild  pulmonary edema is similar to prior exam.  Cardiomegaly.  No gross pneumothorax.  IMPRESSION: The CT detected basilar consolidation (more notable on the right) and small pleural effusions better appreciated on recent abdominal CT.  Pulmonary vascular congestion/mild pulmonary edema is similar to prior exam.  Cardiomegaly.   Electronically Signed   By: Bridgett LarssonSteve  Olson M.D.   On: 03/25/2013 17:38     CXR: pulm edema pattern  ASSESSMENT / PLAN:  PULMONARY A:Acute resp failure, in setting of progressive pulmonary edema/ volume overload and metabolic acidosis  >needs intubation given high work of breathing  P:   Intubate  Sedation protocol  F/u abg and CXR  CARDIOVASCULAR A: Diastolic heart failure P:  Cont Lasix 120 IV. Per renal plans Ct hydralazine & nitrates  RENAL A:  AKI on CKD , baseline cr 2.0 range AG and non-AG metab acidosis, initially may have been due to DKA but suspect that this is now largely due to renal failure P:   Lasix 120 IV q 8h  per renal Could consider bicarb for non-AG component acidosis Will consider placing trialysis cath (staff MD comment)  GASTROINTESTINAL A:  No issues P:   NPO PPI for SUP  HEMATOLOGIC A:  Anemia P:  Transfuse for Hgb < 7.0  INFECTIOUS A:  Left buttock abscess. Proteus pan ss. BC pending 3/2 P:   Ct zosyn/ vanc for now, d/c vanco if blood cx remain negative on 3/3  ENDOCRINE A:    DM >not currently in DKA  P:   ssi protocol   NEUROLOGIC A:  Anxiety  P:   PAD protocol    STAFF NOTE: I, Dr Lavinia Sharps have personally reviewed patient's available data, including medical history, events of note, physical examination and test results as part of my evaluation. I have discussed with resident/NP and other care providers such as pharmacist, RN and RRT.  In addition,  I personally evaluated patient and elicited key findings of acute resp failure due to renal failure and acute pulmonary edema with severe metabolic (non lactic)  acidosis with hypoxemia. Will intubate and place trialysis cath. Depending on course might need to move to [place].  Rest per NP/medical resident whose note is outlined above and that I agree with  The patient is critically ill with multiple organ systems failure and requires high complexity decision making for assessment and support, frequent evaluation and titration of therapies, application of advanced monitoring technologies and extensive interpretation of multiple databases.   Critical Care Time devoted to patient care services described in this note is  35  Minutes independent of NP and procedure time  Dr. Kalman Shan, M.D., [facility].C.P Pulmonary and Critical Care Medicine Staff Physician [facility]monary and Critical Care Pager: [phone], If no answer or between  15:00h - 7:00h: call [phone]  03/27/2013 1:39 AM

## 2013-03-27 NOTE — Progress Notes (Signed)
Subjective:  Events noted.  She required intubation- is not making much urine despite big doses of lasix- is being moved to [facility] and dialysis catheter has been placed in order for her to get CRRT Objective Vital signs in last 24 hours: Filed Vitals:   03/27/13 1030 03/27/13 1100 03/27/13 1200 03/27/13 1230  BP: 118/63 108/56 122/61 120/54  Pulse: 74 78 77 78  Temp:      TempSrc:      Resp: 30 22 24 19   Height:      Weight:      SpO2: 97% 94% 94% 97%   Weight change: 0.5 kg (1 lb 1.6 oz)  Intake/Output Summary (Last 24 hours) at 03/27/13 1249 Last data filed at 03/27/13 1200  Gross per 24 hour  Intake 1512.59 ml  Output   1132 ml  Net 380.59 ml    Assessment/ Plan: Pt is a 43 y.o. yo female who was admitted on 03/23/2013 with  DKA/buttock abscess and some A on CRF  Assessment/Plan: 1. Renal- A on CRF- nonoliguric- but not keeping up with ins and now with worsening respiratory status - I agree that she needs renal replacement today, most efficient would be CRRT as I see issues with BP for intermittent HD with a lot of volume removal.  Will use heparin, attempt 100 per hour of volume removal and all dialysate fluid.  I left a message with her significant other Cedric to tell him that this was happening today.  2. HTN/volume- overloaded, but hypotensive.  Will plan for CRRT for volume removal.  3. Anemia- significant- probably not helping her SOB/hypoxia- s/p one unit PRBC - will start an ESA as well 4. Acidosis- gap is not closed but feel that is due to her renal failure- would stop treating aggressively her DKA and just treat her sugar. CRRT will assist with her acidosis    Maicee Ullman A    Labs: Basic Metabolic Panel:  Recent Labs Lab 03/27/13 0016 03/27/13 0330 03/27/13 1120  NA 134* 135* 137  K 3.8 3.6* 3.6*  CL 97 98 101  CO2 13* 14* 17*  GLUCOSE 248* 296* 199*  BUN 79* 83* 87*  CREATININE 3.42* 3.65* 4.07*  CALCIUM 9.0 8.7 8.7  PHOS  --  5.8*  --     Liver Function Tests: No results found for this basename: AST, ALT, ALKPHOS, BILITOT, PROT, ALBUMIN,  in the last 168 hours No results found for this basename: LIPASE, AMYLASE,  in the last 168 hours No results found for this basename: AMMONIA,  in the last 168 hours CBC:  Recent Labs Lab 03/23/13 1227 03/23/13 1919 03/25/13 1005 03/26/13 0635 03/27/13 0330  WBC 21.8* 20.0* 18.4* 15.9* 14.2*  NEUTROABS 19.3*  --  16.8*  --  12.7*  HGB 8.5* 8.4* 7.4* 6.6* 7.0*  HCT 25.3* 25.2* 21.8* 19.6* 20.6*  MCV 87.2 86.6 86.5 86.0 85.5  PLT 241 223 211 224 238   Cardiac Enzymes:  Recent Labs Lab 03/24/13 0700 03/24/13 1200 03/24/13 1826 03/27/13 0330 03/27/13 0825  TROPONINI <0.30 <0.30 <0.30 <0.30 0.31*   CBG:  Recent Labs Lab 03/26/13 1959 03/27/13 0026 03/27/13 0219 03/27/13 0333 03/27/13 0800  GLUCAP 251* 224* 259* 278* 188*    Iron Studies: No results found for this basename: IRON, TIBC, TRANSFERRIN, FERRITIN,  in the last 72 hours Studies/Results: Ct Abdomen Pelvis Wo Contrast  03/25/2013   CLINICAL DATA:  DKA. Question intra-abdominal infection/pelvic abscess. Leukocytosis. Question of IV contrast allergy.  EXAM:  CT ABDOMEN AND PELVIS WITHOUT CONTRAST  TECHNIQUE: Multidetector CT imaging of the abdomen and pelvis was performed following the standard protocol without intravenous contrast.  COMPARISON:  10/19/2012 and 08/05/2009  FINDINGS: Lung bases demonstrate moderate consolidation with air bronchograms over the posterior lower lobes right worse than left likely a pneumonia. Small bilateral pleural effusions are present. Heart is normal in size.  Abdominal images demonstrate evidence of a prior cholecystectomy. There are a few small calcified splenic granulomas. Liver is within normal. The pancreas and adrenal glands are normal. Kidneys are normal in size without evidence of hydronephrosis or nephrolithiasis. Ureters are within normal. Appendix is normal. There is mild  calcified plaque over the abdominal aorta and iliac vessels.  Pelvic images demonstrate a Foley catheter present within a decompressed bladder. An IUD is in adequate position over the uterine fundus. There is evidence of several uterine fibroids unchanged. There are bilateral simple cystic appearing ovaries. No free fluid in the pelvis. No evidence of adenopathy. There is mild degenerative change of the hips.  IMPRESSION: Airspace consolidation over the posterior lower lobes right worse than left with air bronchograms suggesting multifocal pneumonia. Small bilateral pleural effusions.  No acute findings in the abdomen/pelvis.  Fibroid uterus unchanged. Simple cystic bilateral ovaries. IUD in adequate position.   Electronically Signed   By: Elberta Fortisaniel  Boyle M.D.   On: 03/25/2013 14:37   Dg Chest Port 1 View  03/27/2013   CLINICAL DATA:  Central line placed  EXAM: PORTABLE CHEST - 1 VIEW  COMPARISON:  Chest x-ray from the same day at 1:50 a.m.  FINDINGS: Endotracheal tube has been retracted, now in the mid thoracic trachea. Orogastric tube at least reaches the diaphragm. New right IJ dual-lumen catheter, tip at the level of the SVC. No evidence of pneumothorax or increased pleural fluid. Unchanged diffuse interstitial and airspace disease. There may be an layering effusions.  IMPRESSION: 1. New right IJ catheter.  No adverse features. 2. Endotracheal and orogastric tubes in good position. 3. Unchanged diffuse airspace disease.   Electronically Signed   By: Tiburcio PeaJonathan  Watts M.D.   On: 03/27/2013 05:07   Dg Chest Port 1 View  03/27/2013   CLINICAL DATA:  Endotracheal tube placement.  EXAM: PORTABLE CHEST - 1 VIEW  COMPARISON:  03/25/2013.  FINDINGS: Endotracheal tube tip just above the carina. Recommend retracted by 2.5 cm.  Nasogastric tube courses below the diaphragm. Tip is not included on the present exam.  Worsening of diffuse airspace disease approaching ARDS pattern.  Given the diffuse pulmonary disease limited  evaluation of the mediastinal and cardiac silhouette.  No gross pneumothorax.  IMPRESSION: Endotracheal tube tip just above the carina. Recommend retracted by 2.5 cm.  Nasogastric tube courses below the diaphragm. Tip is not included on the present exam.  Worsening of diffuse airspace disease approaching ARDS pattern.  These results were called by telephone at the time of interpretation on 03/27/2013 at 2:20 AM to [facility]Katherine, patient's nurse , who verbally acknowledged these results.   Electronically Signed   By: Bridgett LarssonSteve  Olson M.D.   On: 03/27/2013 02:21   Dg Chest Port 1 View  03/25/2013   CLINICAL DATA:  Shortness breath.  EXAM: PORTABLE CHEST - 1 VIEW  COMPARISON:  03/24/2013 chest x-ray. 03/25/2013 CT abdomen and pelvis.  FINDINGS: The CT detected basilar consolidation (more notable on the right) and small pleural effusions better appreciated on recent abdominal CT.  Pulmonary vascular congestion/mild pulmonary edema is similar to prior exam.  Cardiomegaly.  No gross pneumothorax.  IMPRESSION: The CT detected basilar consolidation (more notable on the right) and small pleural effusions better appreciated on recent abdominal CT.  Pulmonary vascular congestion/mild pulmonary edema is similar to prior exam.  Cardiomegaly.   Electronically Signed   By: Bridgett Larsson M.D.   On: 03/25/2013 17:38   Medications: Infusions: . sodium chloride 20 mL/hr (03/24/13 0811)  . fentaNYL infusion INTRAVENOUS 100 mcg/hr (03/27/13 0800)    Scheduled Medications: . amLODipine  5 mg Oral BID  . antiseptic oral rinse  15 mL Mouth Rinse QID  . aspirin  81 mg Oral Daily  . atorvastatin  40 mg Oral q1800  . chlorhexidine  15 mL Mouth Rinse BID  . furosemide  160 mg Intravenous Q6H  . heparin  5,000 Units Subcutaneous 3 times per day  . hydrALAZINE  25 mg Oral TID  . insulin aspart  0-20 Units Subcutaneous Q4H  . insulin glargine  10 Units Subcutaneous Q12H  . levofloxacin (LEVAQUIN) IV  750 mg Intravenous Q48H  .  lidocaine (cardiac) 100 mg/84ml      . nitroGLYCERIN  0.5 inch Topical 4 times per day  . pantoprazole (PROTONIX) IV  40 mg Intravenous Daily  . piperacillin-tazobactam (ZOSYN)  IV  3.375 g Intravenous 3 times per day  . succinylcholine      . vancomycin  1,750 mg Intravenous Q48H    have reviewed scheduled and prn medications.  Physical Exam: General: alert on vent Heart: RRR Lungs: CBS bilat Abdomen: soft, non tender Extremities: pitting edema    03/27/2013,12:49 PM  LOS: 4 days

## 2013-03-27 NOTE — Progress Notes (Addendum)
Inpatient Diabetes Program Recommendations  AACE/ADA: New Consensus Statement on Inpatient Glycemic Control (2013)  Target Ranges:  Prepandial:   less than 140 mg/dL      Peak postprandial:   less than 180 mg/dL (1-2 hours)      Critically ill patients:  140 - 180 mg/dL   Reason for Visit: Hyperglycemia   Results for Jasmine GentileMONROE, Kristeen L (MRN [id number]) as of 03/27/2013 06:44  Ref. Range 03/26/2013 12:38 03/26/2013 16:20 03/26/2013 19:59 03/27/2013 00:26 03/27/2013 02:19 03/27/2013 03:33  Glucose-Capillary Latest Range: 70-99 mg/dL 244182 (H) 010230 (H) 272251 (H) 224 (H) 259 (H) 278 (H)   Note:  Transitioned off insulin drip with Lantus 10 units q 12 hours.  May benefit from increasing Lantus to 12 units q 12 hours.  Thank you.  Wateen Varon S. Elsie Lincolnouth, RN, CNS, CDE Inpatient Diabetes Program, team pager [phone]  Addendum:  Aware now that CBG at 8 am was 188.  Do not recommend an increase in Lantus after all.  Thank you.  Keigo Whalley S. Elsie [place]outh, RN, CNS, CDE Inpatient Diabetes Program, team pager [phone]

## 2013-03-27 NOTE — Procedures (Signed)
Not present at bedside during procedure. Done independently by NP  Dr. Kalman ShanMurali Menachem Urbanek, M.D., [facility].C.C.P Pulmonary and Critical Care Medicine Staff Physician [facility]monary and Critical Care Pager: [phone], If no answer or between  15:00h - 7:00h: call [phone]  03/27/2013 8:32 AM

## 2013-03-28 ENCOUNTER — Inpatient Hospital Stay (HOSPITAL_COMMUNITY): Payer: 59

## 2013-03-28 LAB — RENAL FUNCTION PANEL
Albumin: 2.2 g/dL — ABNORMAL LOW (ref 3.5–5.2)
Albumin: 2.3 g/dL — ABNORMAL LOW (ref 3.5–5.2)
BUN: 59 mg/dL — ABNORMAL HIGH (ref 6–23)
BUN: 44 mg/dL — ABNORMAL HIGH (ref 6–23)
CALCIUM: 8.7 mg/dL (ref 8.4–10.5)
CHLORIDE: 101 meq/L (ref 96–112)
CO2: 21 mEq/L (ref 19–32)
CO2: 20 meq/L (ref 19–32)
CREATININE: 2.5 mg/dL — AB (ref 0.50–1.10)
Calcium: 8.6 mg/dL (ref 8.4–10.5)
Chloride: 99 mEq/L (ref 96–112)
Creatinine, Ser: 2.1 mg/dL — ABNORMAL HIGH (ref 0.50–1.10)
GFR calc Af Amer: 32 mL/min — ABNORMAL LOW (ref >90)
GFR, EST AFRICAN AMERICAN: 26 mL/min — AB (ref >90)
GFR, EST NON AFRICAN AMERICAN: 23 mL/min — AB (ref >90)
GFR, EST NON AFRICAN AMERICAN: 28 mL/min — AB (ref >90)
GLUCOSE: 160 mg/dL — AB (ref 70–99)
Glucose, Bld: 113 mg/dL — ABNORMAL HIGH (ref 70–99)
Phosphorus: 3.5 mg/dL (ref 2.3–4.6)
Phosphorus: 3.5 mg/dL (ref 2.3–4.6)
Potassium: 3.4 mEq/L — ABNORMAL LOW (ref 3.7–5.3)
Potassium: 4 mEq/L (ref 3.7–5.3)
SODIUM: 139 meq/L (ref 137–147)
Sodium: 139 mEq/L (ref 137–147)

## 2013-03-28 LAB — GLUCOSE, CAPILLARY
GLUCOSE-CAPILLARY: 110 mg/dL — AB (ref 70–99)
GLUCOSE-CAPILLARY: 121 mg/dL — AB (ref 70–99)
GLUCOSE-CAPILLARY: 128 mg/dL — AB (ref 70–99)
GLUCOSE-CAPILLARY: 140 mg/dL — AB (ref 70–99)
Glucose-Capillary: 108 mg/dL — ABNORMAL HIGH (ref 70–99)
Glucose-Capillary: 134 mg/dL — ABNORMAL HIGH (ref 70–99)
Glucose-Capillary: 152 mg/dL — ABNORMAL HIGH (ref 70–99)

## 2013-03-28 LAB — POCT ACTIVATED CLOTTING TIME
ACTIVATED CLOTTING TIME: 149 s
ACTIVATED CLOTTING TIME: 154 s
ACTIVATED CLOTTING TIME: 171 s
ACTIVATED CLOTTING TIME: 182 s
ACTIVATED CLOTTING TIME: 188 s
Activated Clotting Time: 155 seconds
Activated Clotting Time: 160 seconds
Activated Clotting Time: 160 seconds
Activated Clotting Time: 160 seconds
Activated Clotting Time: 171 seconds
Activated Clotting Time: 177 seconds
Activated Clotting Time: 177 seconds
Activated Clotting Time: 182 seconds
Activated Clotting Time: 199 seconds

## 2013-03-28 LAB — CBC
HCT: 21.5 % — ABNORMAL LOW (ref 36.0–46.0)
Hemoglobin: 7.3 g/dL — ABNORMAL LOW (ref 12.0–15.0)
MCH: 28.5 pg (ref 26.0–34.0)
MCHC: 34 g/dL (ref 30.0–36.0)
MCV: 84 fL (ref 78.0–100.0)
PLATELETS: 276 10*3/uL (ref 150–400)
RBC: 2.56 MIL/uL — AB (ref 3.87–5.11)
RDW: 14.3 % (ref 11.5–15.5)
WBC: 10.8 10*3/uL — AB (ref 4.0–10.5)

## 2013-03-28 LAB — POCT I-STAT 3, ART BLOOD GAS (G3+)
Acid-base deficit: 4 mmol/L — ABNORMAL HIGH (ref 0.0–2.0)
BICARBONATE: 20.4 meq/L (ref 20.0–24.0)
O2 Saturation: 98 %
PH ART: 7.378 (ref 7.350–7.450)
PO2 ART: 101 mmHg — AB (ref 80.0–100.0)
TCO2: 21 mmol/L (ref 0–100)
pCO2 arterial: 34.6 mmHg — ABNORMAL LOW (ref 35.0–45.0)

## 2013-03-28 LAB — MAGNESIUM: Magnesium: 2.3 mg/dL (ref 1.5–2.5)

## 2013-03-28 LAB — APTT: aPTT: 54 seconds — ABNORMAL HIGH (ref 24–37)

## 2013-03-28 MED ORDER — VANCOMYCIN HCL 10 G IV SOLR
1250.0000 mg | INTRAVENOUS | Status: DC
  Filled 2013-03-28: qty 1250

## 2013-03-28 MED ORDER — SODIUM CHLORIDE 0.9 % IV SOLN
50.0000 ug/h | INTRAVENOUS | Status: DC
  Administered 2013-03-28: 100 ug/h via INTRAVENOUS
  Administered 2013-03-29: 200 ug/h via INTRAVENOUS
  Filled 2013-03-28 (×3): qty 50

## 2013-03-28 MED ORDER — LEVOFLOXACIN IN D5W 250 MG/50ML IV SOLN
250.0000 mg | INTRAVENOUS | Status: DC
  Filled 2013-03-28: qty 50

## 2013-03-28 MED ORDER — FENTANYL CITRATE 0.05 MG/ML IJ SOLN
INTRAMUSCULAR | Status: AC
  Administered 2013-03-28: 100 ug
  Filled 2013-03-28: qty 2

## 2013-03-28 MED ORDER — ETOMIDATE 2 MG/ML IV SOLN
20.0000 mg | Freq: Once | INTRAVENOUS | Status: AC
  Administered 2013-03-28: 20 mg via INTRAVENOUS

## 2013-03-28 MED ORDER — FENTANYL CITRATE 0.05 MG/ML IJ SOLN: 100.0000 ug | Freq: Once | INTRAMUSCULAR | Status: DC

## 2013-03-28 MED ORDER — POTASSIUM CHLORIDE 10 MEQ/50ML IV SOLN
10.0000 meq | INTRAVENOUS | Status: AC
  Administered 2013-03-28 (×2): 10 meq via INTRAVENOUS
  Filled 2013-03-28: qty 50

## 2013-03-28 MED ORDER — CHLORHEXIDINE GLUCONATE 0.12 % MT SOLN: 15.0000 mL | Freq: Two times a day (BID) | OROMUCOSAL | Status: DC

## 2013-03-28 MED ORDER — LORAZEPAM 2 MG/ML IJ SOLN: 2.0000 mg | Freq: Once | INTRAMUSCULAR | Status: AC

## 2013-03-28 MED ORDER — LORAZEPAM 2 MG/ML IJ SOLN
INTRAMUSCULAR | Status: AC
  Administered 2013-03-28: 2 mg
  Filled 2013-03-28: qty 1

## 2013-03-28 MED ORDER — BIOTENE DRY MOUTH MT LIQD
15.0000 mL | Freq: Four times a day (QID) | OROMUCOSAL | Status: DC
  Administered 2013-03-29 – 2013-04-02 (×19): 15 mL via OROMUCOSAL

## 2013-03-28 MED ORDER — MIDAZOLAM HCL 2 MG/2ML IJ SOLN
2.0000 mg | Freq: Once | INTRAMUSCULAR | Status: AC
  Administered 2013-03-28: 2 mg via INTRAVENOUS

## 2013-03-28 MED ORDER — MIDAZOLAM HCL 2 MG/2ML IJ SOLN
INTRAMUSCULAR | Status: AC
  Administered 2013-03-28: 2 mg
  Filled 2013-03-28: qty 4

## 2013-03-28 NOTE — Progress Notes (Signed)
Recent Labs Lab 03/27/13 0051 03/27/13 0216 03/27/13 0351 03/27/13 1213 03/27/13 1423 03/27/13 2209  PHART 7.332* 7.244* 7.304* 7.345* 7.343*  --   PCO2ART 27.0* 34.6* 28.1* 30.2* 30.4*  --   PO2ART 88.5 83.5 125.0* 65.7* 81.0  --   HCO3 13.8* 14.3* 13.5* 16.0* 16.6* 20.2  TCO2 13.2 14.0 13.3 15.5 17 21   O2SAT 96.6 94.9 98.6 92.1 96.0 73.0  PULMONARY / CRITICAL CARE MEDICINE  Name: Jasmine [name] MRN: [id number] DOB: Apr 13, 1970    ADMISSION DATE:  03/23/2013 CONSULTATION DATE:  03/28/2013  REFERRING MD :  [facility] PRIMARY SERVICE: PCCM  CHIEF COMPLAINT:  Dsypnea  BRIEF PATIENT DESCRIPTION: 43 yo with poorly controlled insulin-dependent diabetes mellitus admitted 2/27 for L buttock abscess, sepsis and DKA. Intubated for hypoxemic respiratory failure.  SIGNIFICANT EVENTS / STUDIES:  2/27  Incision and drainage of left buttock abscess 3/1    CT abdomen >>> ASD BLL, small BL effusions, IUD 3/3    Intubated 3/4    Transferred to [facility] , CVVHD started  LINES / TUBES: OETT 3/3 >>> 3/4 OGT  3/3 >>> 3/4 R IJ HD cath 3/3 >>> [place] ??? >>>  CULTURES: 2/27  MRCA PCR >>> neg 2/27  Blood >>> neg 2/27  Wound >>>  PROTEUS  ANTIBIOTICS: Vancomycin  2/27 >>> 3/4 Zosyn  2/27 >>>  Levaquin 3/1 >>> 3/4  INTERVAL HISTORY:  Tolerating SBT great this am.  VITAL SIGNS: Temp:  [97.5 F (36.4 C)-97.9 F (36.6 C)] 97.8 F (36.6 C) (03/04 0844) Pulse Rate:  [72-108] 108 (03/04 1047) Resp:  [17-32] 21 (03/04 1047) BP: (103-132)/(39-76) 118/55 mmHg (03/04 1030) SpO2:  [90 %-100 %] 90 % (03/04 1047) FiO2 (%):  [40 %-70 %] 40 % (03/04 0930) Weight:  [128.8 kg (283 lb 15.2 oz)-131.1 kg (289 lb 0.4 oz)] 128.8 kg (283 lb 15.2 oz) (03/04 0400)  HEMODYNAMICS: CVP:  [12 mmHg-15 mmHg] 14 mmHg  VENTILATOR SETTINGS: Vent Mode:  [-] PSV;CPAP FiO2 (%):  [40 %-70 %] 40 % Set Rate:  [20 bmp-28 bmp] 28 bmp Vt Set:  [400 mL-480 mL] 430 mL PEEP:  [5 cmH20-10 cmH20] 5 cmH20 Pressure Support:  [5  cmH20] 5 cmH20 Plateau Pressure:  [11 cmH20-26 cmH20] 15 cmH20  INTAKE / OUTPUT: Intake/Output     03/03 0701 - 03/04 0700 03/04 0701 - 03/05 0700   P.O. 0    I.V. (mL/kg) 526.7 (4.1) 35.8 (0.3)   Blood     NG/GT 60    IV Piggyback 841 135   Total Intake(mL/kg) 1427.7 (11.1) 170.8 (1.3)   Urine (mL/kg/hr) 538 (0.2) 65 (0.1)   Other 3071 (1) 573 (1.1)   Total Output 3609 638   Net -2181.3 -467.2          PHYSICAL EXAMINATION: Gen: Comfortable on SBT, no increased work of breathing ENT: ETT / OGT Neck: No JVD Lungs: Bilateral diminished air entry Cardiovascular: Regular, no murmurs Abdomen: Obese, soft Musculoskeletal: Moves all extremities Neuro:  Awake, following commands Skin:  Warm, no lesions / rash  LABS:  CBC  Recent Labs Lab 03/26/13 0635 03/27/13 0330 03/28/13 0400  WBC 15.9* 14.2* 10.8*  HGB 6.6* 7.0* 7.3*  HCT 19.6* 20.6* 21.5*  PLT 224 238 276   Coag's  Recent Labs Lab 03/23/13 1919 03/28/13 0400  APTT  --  54*  INR 1.22  --    BMET  Recent Labs Lab 03/27/13 1120 03/27/13 1600 03/28/13 0400  NA 137 139 139  K 3.6*  3.7 3.4*  CL 101 103 101  CO2 17* 18* 21  BUN 87* 86* 59*  CREATININE 4.07* 3.79* 2.50*  GLUCOSE 199* 160* 113*   Electrolytes  Recent Labs Lab 03/27/13 0330 03/27/13 1120 03/27/13 1600 03/28/13 0400  CALCIUM 8.7 8.7 8.8 8.6  MG 2.1  --   --  2.3  PHOS 5.8*  --  5.2* 3.5   Sepsis Markers  Recent Labs Lab 03/23/13 1317 03/25/13 1610 03/25/13 1611 03/26/13 0635 03/27/13 0330 03/27/13 1120  LATICACIDVEN 2.53* 1.0  --   --   --  1.1  PROCALCITON  --   --  3.25 4.04 3.61  --    ABG  Recent Labs Lab 03/27/13 0351 03/27/13 1213 03/27/13 1423  PHART 7.304* 7.345* 7.343*  PCO2ART 28.1* 30.2* 30.4*  PO2ART 125.0* 65.7* 81.0   Liver Enzymes  Recent Labs Lab 03/27/13 1600 03/28/13 0400  ALBUMIN 2.4* 2.2*   Cardiac Enzymes  Recent Labs Lab 03/24/13 1200 03/24/13 1826 03/27/13 0330  03/27/13 0825  TROPONINI <0.30 <0.30 <0.30 0.31*  PROBNP 6479.0*  --   --   --    Glucose  Recent Labs Lab 03/27/13 1543 03/27/13 1855 03/28/13 0003 03/28/13 0409 03/28/13 0412 03/28/13 0754  GLUCAP 154* 149* 121* 108* 140* 110*   IMAGING:   Dg Chest Port 1 View  03/28/2013   CLINICAL DATA:  Hypoxia  EXAM: PORTABLE CHEST - 1 VIEW  COMPARISON:  March 27, 2013  FINDINGS: Endotracheal tube tip is 5.6 cm above the carinal. Nasogastric tube tip and side port are in the stomach. Central catheter tip is in the superior vena cava. No pneumothorax.  Compared to 1 day prior, there has been significant clearing of alveolar edema. Mild interstitial edema remains. There is no airspace consolidation. Heart is prominent with normal pulmonary vascularity, stable.  IMPRESSION: Significant interval clearing of alveolar edema. Patchy interstitial edema remains bilaterally, primarily on the right. Tube and catheter positions as described without pneumothorax. Cardiac prominence remains without change.   Electronically Signed   By: Bretta Bang M.D.   On: 03/28/2013 07:09   Dg Chest Port 1 View  03/27/2013   CLINICAL DATA:  Central line placed  EXAM: PORTABLE CHEST - 1 VIEW  COMPARISON:  Chest x-ray from the same day at 1:50 a.m.  FINDINGS: Endotracheal tube has been retracted, now in the mid thoracic trachea. Orogastric tube at least reaches the diaphragm. New right IJ dual-lumen catheter, tip at the level of the SVC. No evidence of pneumothorax or increased pleural fluid. Unchanged diffuse interstitial and airspace disease. There may be an layering effusions.  IMPRESSION: 1. New right IJ catheter.  No adverse features. 2. Endotracheal and orogastric tubes in good position. 3. Unchanged diffuse airspace disease.   Electronically Signed   By: Tiburcio Pea M.D.   On: 03/27/2013 05:07   Dg Chest Port 1 View  03/27/2013   CLINICAL DATA:  Endotracheal tube placement.  EXAM: PORTABLE CHEST - 1 VIEW  COMPARISON:   03/25/2013.  FINDINGS: Endotracheal tube tip just above the carina. Recommend retracted by 2.5 cm.  Nasogastric tube courses below the diaphragm. Tip is not included on the present exam.  Worsening of diffuse airspace disease approaching ARDS pattern.  Given the diffuse pulmonary disease limited evaluation of the mediastinal and cardiac silhouette.  No gross pneumothorax.  IMPRESSION: Endotracheal tube tip just above the carina. Recommend retracted by 2.5 cm.  Nasogastric tube courses below the diaphragm. Tip is not included on the  present exam.  Worsening of diffuse airspace disease approaching ARDS pattern.  These results were called by telephone at the time of interpretation on 03/27/2013 at 2:20 AM to [facility], patient's nurse , who verbally acknowledged these results.   Electronically Signed   By: Bridgett LarssonSteve  Olson M.D.   On: 03/27/2013 02:21   ASSESSMENT / PLAN:  PULMONARY A: Acute respiratory failure in setting in setting of pulmonary edema / pneumonia / metabolic acidosis Acute pulmonary edema Possible pneumonia P:   Extubate as acidosis resolved, fluid status improved Supplemental oxygen to goal SpO2>92 Incentive spirometry Albuterol PRN  CARDIOVASCULAR A: Acute on chronic diastolic heart failure P:  Lipitor, ASA  RENAL A:   Acute on chronic renal failure AG and non-AG metab acidosis, improving Mild hypokalemia P:   Renal Following Trend BMP K 10 x 2  GASTROINTESTINAL A:   GI Px not indicated Nutrition P:   Diet when able D/c Protonix  HEMATOLOGIC A:   Anemia P:  Trend CBC Transfuse for Hgb < 7.0 Aranesp  INFECTIOUS A:   Left buttock abscess with PROTEUS P:   Abx as above D/c Vancomycin / Levaquin  ENDOCRINE A:    DM DKA resolved P:   SSI Lantus 10  NEUROLOGIC A:   No active issues P:   D/c Fentanyl / Versed / Hydrocodone  I have personally obtained history, examined patient, evaluated and interpreted laboratory and imaging results, reviewed  medical records, formulated assessment / plan and placed orders.  CRITICAL CARE:  The patient is critically ill with multiple organ systems failure and requires high complexity decision making for assessment and support, frequent evaluation and titration of therapies, application of advanced monitoring technologies and extensive interpretation of multiple databases. Critical Care Time devoted to patient care services described in this note is 40 minutes.   Lonia FarberZUBELEVITSKIY, Eliane Hammersmith, MD Pulmonary and Critical Care Medicine [facility] Pager: [phone]  03/28/2013, 11:20 AM

## 2013-03-28 NOTE — Progress Notes (Signed)
Wasted 150 cc of Fentanyl in sink with Cyndie ChimeMeghan Shelton, RN.

## 2013-03-28 NOTE — Progress Notes (Signed)
Patient ID: Jasmine [name], female   DOB: 28-Nov-1970, 43 y.o.   MRN: [id number]    Subjective: Patient is very alert and cooperative on ventilator. She denies by Dr. Perineal pain.  Objective: Vital signs in last 24 hours: Temp:  [97.5 F (36.4 C)-97.9 F (36.6 C)] 97.9 F (36.6 C) (03/04 0426) Pulse Rate:  [72-97] 92 (03/04 0700) Resp:  [17-32] 20 (03/04 0700) BP: (103-138)/(50-71) 116/50 mmHg (03/04 0700) SpO2:  [94 %-100 %] 100 % (03/04 0700) FiO2 (%):  [50 %-70 %] 50 % (03/04 0700) Weight:  [283 lb 15.2 oz (128.8 kg)-289 lb 0.4 oz (131.1 kg)] 283 lb 15.2 oz (128.8 kg) (03/04 0400) Last BM Date:  (unknown)  Intake/Output from previous day: 03/03 0701 - 03/04 0700 In: 1452.7 [I.V.:526.7; NG/GT:60; IV Piggyback:866] Out: 3609 [Urine:538] Intake/Output this shift:    General appearance: alert and responsive, answers questions and follows commands on vent Incision/Wound: small I&D wound, less than 1 cm of left buttock is extremely clean without any induration or erythema or drainage on repacking  Lab Results:   Recent Labs  03/27/13 0330 03/28/13 0400  WBC 14.2* 10.8*  HGB 7.0* 7.3*  HCT 20.6* 21.5*  PLT 238 276   BMET  Recent Labs  03/27/13 1600 03/28/13 0400  NA 139 139  K 3.7 3.4*  CL 103 101  CO2 18* 21  GLUCOSE 160* 113*  BUN 86* 59*  CREATININE 3.79* 2.50*  CALCIUM 8.8 8.6     Studies/Results: Dg Chest Port 1 View  03/28/2013   CLINICAL DATA:  Hypoxia  EXAM: PORTABLE CHEST - 1 VIEW  COMPARISON:  March 27, 2013  FINDINGS: Endotracheal tube tip is 5.6 cm above the carinal. Nasogastric tube tip and side port are in the stomach. Central catheter tip is in the superior vena cava. No pneumothorax.  Compared to 1 day prior, there has been significant clearing of alveolar edema. Mild interstitial edema remains. There is no airspace consolidation. Heart is prominent with normal pulmonary vascularity, stable.  IMPRESSION: Significant interval clearing of alveolar  edema. Patchy interstitial edema remains bilaterally, primarily on the right. Tube and catheter positions as described without pneumothorax. Cardiac prominence remains without change.   Electronically Signed   By: Bretta BangWilliam  Woodruff M.D.   On: 03/28/2013 07:09   Dg Chest Port 1 View  03/27/2013   CLINICAL DATA:  Central line placed  EXAM: PORTABLE CHEST - 1 VIEW  COMPARISON:  Chest x-ray from the same day at 1:50 a.m.  FINDINGS: Endotracheal tube has been retracted, now in the mid thoracic trachea. Orogastric tube at least reaches the diaphragm. New right IJ dual-lumen catheter, tip at the level of the SVC. No evidence of pneumothorax or increased pleural fluid. Unchanged diffuse interstitial and airspace disease. There may be an layering effusions.  IMPRESSION: 1. New right IJ catheter.  No adverse features. 2. Endotracheal and orogastric tubes in good position. 3. Unchanged diffuse airspace disease.   Electronically Signed   By: Tiburcio PeaJonathan  Watts M.D.   On: 03/27/2013 05:07   Dg Chest Port 1 View  03/27/2013   CLINICAL DATA:  Endotracheal tube placement.  EXAM: PORTABLE CHEST - 1 VIEW  COMPARISON:  03/25/2013.  FINDINGS: Endotracheal tube tip just above the carina. Recommend retracted by 2.5 cm.  Nasogastric tube courses below the diaphragm. Tip is not included on the present exam.  Worsening of diffuse airspace disease approaching ARDS pattern.  Given the diffuse pulmonary disease limited evaluation of the mediastinal and cardiac  silhouette.  No gross pneumothorax.  IMPRESSION: Endotracheal tube tip just above the carina. Recommend retracted by 2.5 cm.  Nasogastric tube courses below the diaphragm. Tip is not included on the present exam.  Worsening of diffuse airspace disease approaching ARDS pattern.  These results were called by telephone at the time of interpretation on 03/27/2013 at 2:20 AM to [facility], patient's nurse , who verbally acknowledged these results.   Electronically Signed   By: Bridgett Larsson M.D.    On: 03/27/2013 02:21    Anti-infectives: Anti-infectives   Start     Dose/Rate Route Frequency Ordered Stop   03/27/13 2000  vancomycin (VANCOCIN) 1,750 mg in sodium chloride 0.9 % 500 mL IVPB     1,750 mg 250 mL/hr over 120 Minutes Intravenous Every 48 hours 03/26/13 0931     03/25/13 1800  levofloxacin (LEVAQUIN) IVPB 750 mg     750 mg 100 mL/hr over 90 Minutes Intravenous Every 48 hours 03/25/13 1625     03/24/13 2000  vancomycin (VANCOCIN) 1,750 mg in sodium chloride 0.9 % 500 mL IVPB  Status:  Discontinued     1,750 mg 250 mL/hr over 120 Minutes Intravenous Every 24 hours 03/23/13 1903 03/26/13 0931   03/23/13 2000  piperacillin-tazobactam (ZOSYN) IVPB 3.375 g     3.375 g 12.5 mL/hr over 240 Minutes Intravenous 3 times per day 03/23/13 1903     03/23/13 2000  vancomycin (VANCOCIN) 2,000 mg in sodium chloride 0.9 % 500 mL IVPB     2,000 mg 250 mL/hr over 120 Minutes Intravenous  Once 03/23/13 1903 03/24/13 0005   03/23/13 1345  clindamycin (CLEOCIN) IVPB 600 mg     600 mg 100 mL/hr over 30 Minutes Intravenous  Once 03/23/13 1338 03/23/13 1514      Assessment/Plan: Status post I&D of bilateral abscess. The wound is extremely clean and there is no evidence of cellulitis or undrained abscess. CT scan showed no residual abscess or inflammation. Would continue current wound care with daily packing with iodoform gauze.  We will check intermittently.    LOS: 5 days    Flay Ghosh T 03/28/2013

## 2013-03-28 NOTE — Progress Notes (Signed)
ANTIBIOTIC CONSULT NOTE - FOLLOW UP  Pharmacy Consult for Vancomycin, Zosyn and Levaquin Indication: L buttock cellulitis and abscess  Allergies  Allergen Reactions  . Contrast Media [Iodinated Diagnostic Agents] Nausea And Vomiting  . Paroxetine Nausea And Vomiting  . Oxycodone Itching and Rash   Patient Measurements: Height: 5\' 7"  (170.2 cm) Weight: 283 lb 15.2 oz (128.8 kg) IBW/kg (Calculated) : 61.6  Vital Signs: Temp: 97.8 F (36.6 C) (03/04 0844) Temp src: Oral (03/04 0844) BP: 119/49 mmHg (03/04 1100) Pulse Rate: 110 (03/04 1100) Intake/Output from previous day: 03/03 0701 - 03/04 0700 In: 1427.7 [I.V.:526.7; NG/GT:60; IV Piggyback:841] Out: 3609 [Urine:538] Intake/Output from this shift: Total I/O In: 230.8 [I.V.:45.8; Other:10; NG/GT:40; IV Piggyback:135] Out: 923 [Urine:90; Other:833]  Labs:  Recent Labs  03/26/13 0635  03/27/13 0330 03/27/13 1120 03/27/13 1600 03/28/13 0400  WBC 15.9*  --  14.2*  --   --  10.8*  HGB 6.6*  --  7.0*  --   --  7.3*  PLT 224  --  238  --   --  276  CREATININE 3.10*  < > 3.65* 4.07* 3.79* 2.50*  < > = values in this interval not displayed. Estimated Creatinine Clearance: 41 ml/min (by C-G formula based on Cr of 2.5).  Microbiology: Recent Results (from the past 720 hour(s))  CULTURE, BLOOD (ROUTINE X 2)     Status: None   Collection Time    03/23/13 12:55 PM      Result Value Ref Range Status   Specimen Description BLOOD RIGHT ANTECUBITAL   Final   Special Requests BOTTLES DRAWN AEROBIC AND ANAEROBIC   Final   Culture  Setup Time     Final   Value: 03/23/2013 16:40     Performed at [facility]   Culture     Final   Value:        BLOOD CULTURE RECEIVED NO GROWTH TO DATE CULTURE WILL BE HELD FOR 5 DAYS BEFORE ISSUING A FINAL NEGATIVE REPORT     Performed at [facility]   Report Status PENDING   Incomplete  CULTURE, BLOOD (ROUTINE X 2)     Status: None   Collection Time    03/23/13  1:05 PM       Result Value Ref Range Status   Specimen Description BLOOD RIGHT ARM   Final   Special Requests BOTTLES DRAWN AEROBIC AND ANAEROBIC   Final   Culture  Setup Time     Final   Value: 03/23/2013 16:40     Performed at [facility]   Culture     Final   Value:        BLOOD CULTURE RECEIVED NO GROWTH TO DATE CULTURE WILL BE HELD FOR 5 DAYS BEFORE ISSUING A FINAL NEGATIVE REPORT     Performed at [facility]   Report Status PENDING   Incomplete  WOUND CULTURE     Status: None   Collection Time    03/23/13  1:48 PM      Result Value Ref Range Status   Specimen Description BUTTOCKS RIGHT   Final   Special Requests NONE   Final   Gram Stain     Final   Value: RARE WBC PRESENT,BOTH PMN AND MONONUCLEAR     NO SQUAMOUS EPITHELIAL CELLS SEEN     FEW GRAM POSITIVE COCCI IN PAIRS     FEW GRAM NEGATIVE RODS     FEW GRAM POSITIVE RODS  Culture     Final   Value: FEW PROTEUS MIRABILIS     [facility]   Report Status 03/25/2013 FINAL   Final   Organism ID, Bacteria PROTEUS MIRABILIS   Final  MRSA PCR SCREENING     Status: None   Collection Time    03/23/13  6:06 PM      Result Value Ref Range Status   MRSA by PCR NEGATIVE  NEGATIVE Final   Comment:            The GeneXpert MRSA Assay (FDA     approved for NASAL specimens     only), is one component of a     comprehensive MRSA colonization     surveillance program. It is not     intended to diagnose MRSA     infection nor to guide or     monitor treatment for     MRSA infections.    Anti-infectives   Start     Dose/Rate Route Frequency Ordered Stop   03/28/13 2100  vancomycin (VANCOCIN) 1,250 mg in sodium chloride 0.9 % 250 mL IVPB  Status:  Discontinued     1,250 mg 166.7 mL/hr over 90 Minutes Intravenous Every 24 hours 03/28/13 1120 03/28/13 1125   03/28/13 2000  Levofloxacin (LEVAQUIN) IVPB 250 mg  Status:  Discontinued     250 mg 50 mL/hr over 60 Minutes Intravenous Every 24 hours  03/28/13 1118 03/28/13 1125   03/27/13 2000  vancomycin (VANCOCIN) 1,750 mg in sodium chloride 0.9 % 500 mL IVPB  Status:  Discontinued     1,750 mg 250 mL/hr over 120 Minutes Intravenous Every 48 hours 03/26/13 0931 03/28/13 1120   03/25/13 1800  levofloxacin (LEVAQUIN) IVPB 750 mg  Status:  Discontinued     750 mg 100 mL/hr over 90 Minutes Intravenous Every 48 hours 03/25/13 1625 03/28/13 1116   03/24/13 2000  vancomycin (VANCOCIN) 1,750 mg in sodium chloride 0.9 % 500 mL IVPB  Status:  Discontinued     1,750 mg 250 mL/hr over 120 Minutes Intravenous Every 24 hours 03/23/13 1903 03/26/13 0931   03/23/13 2000  piperacillin-tazobactam (ZOSYN) IVPB 3.375 g  Status:  Discontinued     3.375 g 12.5 mL/hr over 240 Minutes Intravenous 3 times per day 03/23/13 1903 03/28/13 1123   03/23/13 2000  vancomycin (VANCOCIN) 2,000 mg in sodium chloride 0.9 % 500 mL IVPB     2,000 mg 250 mL/hr over 120 Minutes Intravenous  Once 03/23/13 1903 03/24/13 0005   03/23/13 1345  clindamycin (CLEOCIN) IVPB 600 mg     600 mg 100 mL/hr over 30 Minutes Intravenous  Once 03/23/13 1338 03/23/13 1514     Assessment: 42 yoF admitted with left buttock cellulitis/abscess and DKA.  Perirectal abscess drained in ED.  CT abdomen performed 3/1 showed multifocal pneumonia, no acute findings in the abdomen/pelvis.  Patient remains on multiple broad spectrum antibiotic coverage.  She has acute on chronic renal failure, oliguric and now on CVVHD for volume control.  Will adjust antibiotic regimen for CVVH.  Vancomycin 1250 mg IV daily  Levaquin 250 mg IV daily  Zosyn 3.375 gm IV every 8 hours  Goal of Therapy:  Vancomycin trough level 15-20 mcg/ml  Plan:  1.  See above for antibiotic dose adjustments with CVVH 2.  Will monitor response and ongoing needs for antibiotic therapy  Nadara MustardNita Benno Brensinger, PharmD., MS Clinical Pharmacist Pager:  [phone] Thank you for allowing pharmacy to be  part of this patients care  team. 03/28/2013,11:25 AM

## 2013-03-28 NOTE — Procedures (Signed)
Name: [name] MRN: [id number] DOB: 12-11-1970   PROCEDURE NOTE  Procedure:  Endotracheal intubation.  Indication:  Acute respiratory failure  Consent:  Consent was implied due to the emergency nature of the procedure.  Anesthesia:  A total of 10 mg of Etomidate was given intravenously.  Procedure summary:  Appropriate equipment was assembled. The patient was identified as Jasmine [name] and safety timeout was performed. The patient was placed supine, with head in sniffing position. After adequate level of anesthesia was achieved, a GS#3 blade was inserted into the oropharynx and the vocal cords were visualized. A 7.5 endotracheal tube was inserted without difficulty and visualized going through the vocal cords. The stylette was removed and cuff inflated. Colorimetric change was noted on the CO2 meter. Breath sounds were heard over both lung fields equally. ETT was secured at 24 lip line.  Post procedure chest xray was ordered.  Complications:  No immediate complications were noted.  Hemodynamic parameters and oxygenation remained stable throughout the procedure.    Orlean BradfordK. Terri Malerba, M.D. Pulmonary and Critical Care Medicine [facility] Pager: [phone]  03/28/2013, 1:40 PM

## 2013-03-28 NOTE — Procedures (Signed)
Extubation Procedure Note  Patient Details:   Name: Jasmine [name] DOB: 1970-02-11 MRN: [id number]   Airway Documentation:     Evaluation  O2 sats: stable throughout Complications: No apparent complications Patient did tolerate procedure well. Bilateral Breath Sounds: Clear;Diminished Suctioning: Airway Yes  Order received for extubation.  Cuff leak negative prior to extubation; MD notified.  Order received to proceed with extubation.  Placed on 4l [place].  Patient able to vocalize well after extubation.  No stridor noted; no complications noted.  Will continue to monitor.  Lysbeth PennerSmith, Deanndra Kirley [facility] 03/28/2013, 10:48 AM

## 2013-03-28 NOTE — Progress Notes (Signed)
Subjective:  Events noted.  Moved to [facility] for CRRT- has been running well. Pt still alert on vent- getting suctioned- UOP only 15-30 cc per hour Objective Vital signs in last 24 hours: Filed Vitals:   03/28/13 0426 03/28/13 0500 03/28/13 0600 03/28/13 0700  BP: 121/62 125/63 131/58 116/50  Pulse: 90 88 94 92  Temp: 97.9 F (36.6 C)     TempSrc: Oral     Resp: 32 17 23 20   Height:      Weight:      SpO2: 100% 100% 100% 100%   Weight change: 0 kg (0 lb)  Intake/Output Summary (Last 24 hours) at 03/28/13 0759 Last data filed at 03/28/13 0700  Gross per 24 hour  Intake 1452.67 ml  Output   3609 ml  Net -2156.33 ml    Assessment/ Plan: Pt is a 43 y.o. yo female who was admitted on 03/23/2013 with  DKA/buttock abscess and  A on CRF  Assessment/Plan: 1. Renal- A on CRF- has worsened and is now oliguric-  requiring renal replacement with CRRT mostly for volume.   Using heparin, increase uf to 150-200 per hour and all dialysate fluid.  I am hopeful that she will eventually recover her renal function- baseline creatinine in the low 2's.  I want to leave her on CRRT for now so we can make a dent in her volume status and get her off the vent sooner hopefully 2. HTN/volume- overloaded, BP marginal- will stop her antihypertensives so we can be more successful with volume removal, will increase rate of removal to 150-200 cc per hour.  3. Anemia- significant- probably not helping her SOB/hypoxia- s/p one unit PRBC -is on ESA as well 4. Acidosis- improved 5. Elytes- on all 4 K bath- will give a couple runs today as well to stay ahead of it. 6. Pulm- VDRF- per CCM- trying volume removal, also on zosyn , levaquin and vanc for ?PNA 7. Buttock abscess- antibiotics- WBC coming down   Audi Wettstein A    Labs: Basic Metabolic Panel:  Recent Labs Lab 03/27/13 0330 03/27/13 1120 03/27/13 1600 03/28/13 0400  NA 135* 137 139 139  K 3.6* 3.6* 3.7 3.4*  CL 98 101 103 101  CO2 14* 17* 18*  21  GLUCOSE 296* 199* 160* 113*  BUN 83* 87* 86* 59*  CREATININE 3.65* 4.07* 3.79* 2.50*  CALCIUM 8.7 8.7 8.8 8.6  PHOS 5.8*  --  5.2* 3.5   Liver Function Tests:  Recent Labs Lab 03/27/13 1600 03/28/13 0400  ALBUMIN 2.4* 2.2*   No results found for this basename: LIPASE, AMYLASE,  in the last 168 hours No results found for this basename: AMMONIA,  in the last 168 hours CBC:  Recent Labs Lab 03/23/13 1227 03/23/13 1919 03/25/13 1005 03/26/13 0635 03/27/13 0330 03/28/13 0400  WBC 21.8* 20.0* 18.4* 15.9* 14.2* 10.8*  NEUTROABS 19.3*  --  16.8*  --  12.7*  --   HGB 8.5* 8.4* 7.4* 6.6* 7.0* 7.3*  HCT 25.3* 25.2* 21.8* 19.6* 20.6* 21.5*  MCV 87.2 86.6 86.5 86.0 85.5 84.0  PLT 241 223 211 224 238 276   Cardiac Enzymes:  Recent Labs Lab 03/24/13 0700 03/24/13 1200 03/24/13 1826 03/27/13 0330 03/27/13 0825  TROPONINI <0.30 <0.30 <0.30 <0.30 0.31*   CBG:  Recent Labs Lab 03/27/13 1543 03/27/13 1855 03/28/13 0003 03/28/13 0409 03/28/13 0412  GLUCAP 154* 149* 121* 108* 140*    Iron Studies: No results found for this basename: IRON, TIBC, TRANSFERRIN,  FERRITIN,  in the last 72 hours Studies/Results: Dg Chest Port 1 View  03/28/2013   CLINICAL DATA:  Hypoxia  EXAM: PORTABLE CHEST - 1 VIEW  COMPARISON:  March 27, 2013  FINDINGS: Endotracheal tube tip is 5.6 cm above the carinal. Nasogastric tube tip and side port are in the stomach. Central catheter tip is in the superior vena cava. No pneumothorax.  Compared to 1 day prior, there has been significant clearing of alveolar edema. Mild interstitial edema remains. There is no airspace consolidation. Heart is prominent with normal pulmonary vascularity, stable.  IMPRESSION: Significant interval clearing of alveolar edema. Patchy interstitial edema remains bilaterally, primarily on the right. Tube and catheter positions as described without pneumothorax. Cardiac prominence remains without change.   Electronically Signed   By:  Bretta BangWilliam  Woodruff M.D.   On: 03/28/2013 07:09   Dg Chest Port 1 View  03/27/2013   CLINICAL DATA:  Central line placed  EXAM: PORTABLE CHEST - 1 VIEW  COMPARISON:  Chest x-ray from the same day at 1:50 a.m.  FINDINGS: Endotracheal tube has been retracted, now in the mid thoracic trachea. Orogastric tube at least reaches the diaphragm. New right IJ dual-lumen catheter, tip at the level of the SVC. No evidence of pneumothorax or increased pleural fluid. Unchanged diffuse interstitial and airspace disease. There may be an layering effusions.  IMPRESSION: 1. New right IJ catheter.  No adverse features. 2. Endotracheal and orogastric tubes in good position. 3. Unchanged diffuse airspace disease.   Electronically Signed   By: Tiburcio PeaJonathan  Watts M.D.   On: 03/27/2013 05:07   Dg Chest Port 1 View  03/27/2013   CLINICAL DATA:  Endotracheal tube placement.  EXAM: PORTABLE CHEST - 1 VIEW  COMPARISON:  03/25/2013.  FINDINGS: Endotracheal tube tip just above the carina. Recommend retracted by 2.5 cm.  Nasogastric tube courses below the diaphragm. Tip is not included on the present exam.  Worsening of diffuse airspace disease approaching ARDS pattern.  Given the diffuse pulmonary disease limited evaluation of the mediastinal and cardiac silhouette.  No gross pneumothorax.  IMPRESSION: Endotracheal tube tip just above the carina. Recommend retracted by 2.5 cm.  Nasogastric tube courses below the diaphragm. Tip is not included on the present exam.  Worsening of diffuse airspace disease approaching ARDS pattern.  These results were called by telephone at the time of interpretation on 03/27/2013 at 2:20 AM to [facility]'s nurse , who verbally acknowledged these results.   Electronically Signed   By: Bridgett LarssonSteve  Olson M.D.   On: 03/27/2013 02:21   Medications: Infusions: . sodium chloride 20 mL/hr (03/24/13 0811)  . fentaNYL infusion INTRAVENOUS 100 mcg/hr (03/28/13 0735)  . heparin 10,000 units/ 20 mL infusion syringe 2,200  Units/hr (03/28/13 0759)  . dialysis replacement fluid (prismasate) 250 mL/hr at 03/27/13 1455  . dialysis replacement fluid (prismasate) 250 mL/hr at 03/27/13 1454  . dialysate (PRISMASATE) 1,500 mL/hr at 03/28/13 0442    Scheduled Medications: . amLODipine  5 mg Oral BID  . antiseptic oral rinse  15 mL Mouth Rinse QID  . aspirin  81 mg Oral Daily  . atorvastatin  40 mg Oral q1800  . chlorhexidine  15 mL Mouth Rinse BID  . darbepoetin (ARANESP) injection - DIALYSIS  100 mcg Intravenous Q Tue-HD  . heparin  5,000 Units Subcutaneous 3 times per day  . hydrALAZINE  25 mg Oral TID  . insulin aspart  0-20 Units Subcutaneous Q4H  . insulin glargine  10 Units Subcutaneous Q12H  .  levofloxacin (LEVAQUIN) IV  750 mg Intravenous Q48H  . pantoprazole (PROTONIX) IV  40 mg Intravenous Daily  . piperacillin-tazobactam (ZOSYN)  IV  3.375 g Intravenous 3 times per day  . vancomycin  1,750 mg Intravenous Q48H    have reviewed scheduled and prn medications.  Physical Exam: General: alert on vent Heart: RRR Lungs: CBS bilat Abdomen: soft, non tender Extremities: pitting edema    03/28/2013,7:59 AM  LOS: 5 days

## 2013-03-29 ENCOUNTER — Inpatient Hospital Stay (HOSPITAL_COMMUNITY): Payer: 59

## 2013-03-29 DIAGNOSIS — L03317 Cellulitis of buttock: Secondary | ICD-10-CM

## 2013-03-29 DIAGNOSIS — L0231 Cutaneous abscess of buttock: Secondary | ICD-10-CM

## 2013-03-29 LAB — GLUCOSE, CAPILLARY
GLUCOSE-CAPILLARY: 113 mg/dL — AB (ref 70–99)
GLUCOSE-CAPILLARY: 131 mg/dL — AB (ref 70–99)
Glucose-Capillary: 140 mg/dL — ABNORMAL HIGH (ref 70–99)
Glucose-Capillary: 122 mg/dL — ABNORMAL HIGH (ref 70–99)
Glucose-Capillary: 128 mg/dL — ABNORMAL HIGH (ref 70–99)
Glucose-Capillary: 132 mg/dL — ABNORMAL HIGH (ref 70–99)

## 2013-03-29 LAB — CULTURE, BLOOD (ROUTINE X 2)
CULTURE: NO GROWTH
Culture: NO GROWTH

## 2013-03-29 LAB — POCT ACTIVATED CLOTTING TIME
ACTIVATED CLOTTING TIME: 188 s
ACTIVATED CLOTTING TIME: 193 s
ACTIVATED CLOTTING TIME: 188 s
ACTIVATED CLOTTING TIME: 204 s
ACTIVATED CLOTTING TIME: 227 s
ACTIVATED CLOTTING TIME: 227 s
Activated Clotting Time: 193 seconds
Activated Clotting Time: 193 seconds
Activated Clotting Time: 199 seconds
Activated Clotting Time: 199 seconds
Activated Clotting Time: 199 seconds
Activated Clotting Time: 204 seconds
Activated Clotting Time: 204 seconds
Activated Clotting Time: 204 seconds
Activated Clotting Time: 210 seconds
Activated Clotting Time: 210 seconds
Activated Clotting Time: 215 seconds
Activated Clotting Time: 221 seconds

## 2013-03-29 LAB — CBC
HCT: 23.5 % — ABNORMAL LOW (ref 36.0–46.0)
HEMOGLOBIN: 7.7 g/dL — AB (ref 12.0–15.0)
MCH: 28.2 pg (ref 26.0–34.0)
MCHC: 32.8 g/dL (ref 30.0–36.0)
MCV: 86.1 fL (ref 78.0–100.0)
Platelets: 324 10*3/uL (ref 150–400)
RBC: 2.73 MIL/uL — AB (ref 3.87–5.11)
RDW: 14.9 % (ref 11.5–15.5)
WBC: 14.1 10*3/uL — AB (ref 4.0–10.5)

## 2013-03-29 LAB — RENAL FUNCTION PANEL
ALBUMIN: 2.4 g/dL — AB (ref 3.5–5.2)
ALBUMIN: 2.5 g/dL — AB (ref 3.5–5.2)
BUN: 35 mg/dL — ABNORMAL HIGH (ref 6–23)
BUN: 31 mg/dL — ABNORMAL HIGH (ref 6–23)
CALCIUM: 9.2 mg/dL (ref 8.4–10.5)
CO2: 22 mEq/L (ref 19–32)
CO2: 23 mEq/L (ref 19–32)
CREATININE: 2.06 mg/dL — AB (ref 0.50–1.10)
Calcium: 9 mg/dL (ref 8.4–10.5)
Chloride: 99 mEq/L (ref 96–112)
Chloride: 98 mEq/L (ref 96–112)
Creatinine, Ser: 2.05 mg/dL — ABNORMAL HIGH (ref 0.50–1.10)
GFR, EST AFRICAN AMERICAN: 33 mL/min — AB (ref >90)
GFR, EST AFRICAN AMERICAN: 33 mL/min — AB (ref >90)
GFR, EST NON AFRICAN AMERICAN: 29 mL/min — AB (ref >90)
GFR, EST NON AFRICAN AMERICAN: 29 mL/min — AB (ref >90)
GLUCOSE: 137 mg/dL — AB (ref 70–99)
Glucose, Bld: 135 mg/dL — ABNORMAL HIGH (ref 70–99)
PHOSPHORUS: 2.9 mg/dL (ref 2.3–4.6)
PHOSPHORUS: 3.1 mg/dL (ref 2.3–4.6)
POTASSIUM: 3.9 meq/L (ref 3.7–5.3)
Potassium: 3.8 mEq/L (ref 3.7–5.3)
SODIUM: 139 meq/L (ref 137–147)
SODIUM: 137 meq/L (ref 137–147)

## 2013-03-29 LAB — APTT: aPTT: 185 seconds — ABNORMAL HIGH (ref 24–37)

## 2013-03-29 LAB — MAGNESIUM: MAGNESIUM: 2.7 mg/dL — AB (ref 1.5–2.5)

## 2013-03-29 MED ORDER — PANTOPRAZOLE SODIUM 40 MG IV SOLR
40.0000 mg | INTRAVENOUS | Status: DC
  Administered 2013-03-29: 40 mg via INTRAVENOUS
  Filled 2013-03-29 (×2): qty 40

## 2013-03-29 MED ORDER — LORAZEPAM 2 MG/ML IJ SOLN
2.0000 mg | INTRAMUSCULAR | Status: DC | PRN
  Administered 2013-03-29: 2 mg via INTRAVENOUS
  Filled 2013-03-29: qty 1

## 2013-03-29 MED ORDER — FENTANYL BOLUS VIA INFUSION
50.0000 ug | INTRAVENOUS | Status: DC | PRN
  Filled 2013-03-29: qty 100

## 2013-03-29 MED ORDER — METOPROLOL TARTRATE 25 MG/10 ML ORAL SUSPENSION
12.5000 mg | Freq: Two times a day (BID) | ORAL | Status: DC
  Administered 2013-03-29 – 2013-03-31 (×2): 12.5 mg via ORAL
  Filled 2013-03-29 (×6): qty 5

## 2013-03-29 MED ORDER — DEXMEDETOMIDINE HCL IN NACL 400 MCG/100ML IV SOLN
0.4000 ug/kg/h | INTRAVENOUS | Status: DC
  Administered 2013-03-29: 1.1 ug/kg/h via INTRAVENOUS
  Administered 2013-03-29: 0.4 ug/kg/h via INTRAVENOUS
  Filled 2013-03-29 (×2): qty 100

## 2013-03-29 MED ORDER — FENTANYL CITRATE 0.05 MG/ML IJ SOLN: 50.0000 ug | Freq: Once | INTRAMUSCULAR | Status: DC

## 2013-03-29 MED ORDER — MIDAZOLAM HCL 2 MG/2ML IJ SOLN: 2.0000 mg | Freq: Once | INTRAMUSCULAR | Status: AC

## 2013-03-29 MED ORDER — MIDAZOLAM HCL 2 MG/2ML IJ SOLN
2.0000 mg | INTRAMUSCULAR | Status: DC | PRN
  Administered 2013-03-30: 2 mg via INTRAVENOUS
  Filled 2013-03-29: qty 2

## 2013-03-29 MED ORDER — SODIUM CHLORIDE 0.9 % IV SOLN
0.0000 ug/h | INTRAVENOUS | Status: DC
  Administered 2013-03-29: 275 ug/h via INTRAVENOUS
  Administered 2013-03-30: 350 ug/h via INTRAVENOUS
  Filled 2013-03-29 (×4): qty 50

## 2013-03-29 MED ORDER — DEXTROSE 5 % IV SOLN
2.0000 g | INTRAVENOUS | Status: DC
  Administered 2013-03-29 – 2013-04-02 (×5): 2 g via INTRAVENOUS
  Filled 2013-03-29 (×6): qty 2

## 2013-03-29 MED ORDER — DEXMEDETOMIDINE HCL IN NACL 400 MCG/100ML IV SOLN
0.0000 ug/kg/h | INTRAVENOUS | Status: AC
Start: 2013-03-29 — End: 2013-04-01
  Administered 2013-03-29: 0.5 ug/kg/h via INTRAVENOUS
  Administered 2013-03-29: 0.7 ug/kg/h via INTRAVENOUS
  Administered 2013-03-30 (×3): 1 ug/kg/h via INTRAVENOUS
  Administered 2013-03-30: 0.6 ug/kg/h via INTRAVENOUS
  Administered 2013-03-30: 0.8 ug/kg/h via INTRAVENOUS
  Administered 2013-03-30: 1 ug/kg/h via INTRAVENOUS
  Administered 2013-03-30: 0.6 ug/kg/h via INTRAVENOUS
  Administered 2013-03-31: 0.901 ug/kg/h via INTRAVENOUS
  Administered 2013-03-31: 0.8 ug/kg/h via INTRAVENOUS
  Administered 2013-03-31: 1 ug/kg/h via INTRAVENOUS
  Administered 2013-03-31 (×2): 0.9 ug/kg/h via INTRAVENOUS
  Administered 2013-03-31: 1 ug/kg/h via INTRAVENOUS
  Administered 2013-03-31 (×2): 0.9 ug/kg/h via INTRAVENOUS
  Administered 2013-04-01: 0.2 ug/kg/h via INTRAVENOUS
  Administered 2013-04-01: 0.6 ug/kg/h via INTRAVENOUS
  Filled 2013-03-29: qty 100
  Filled 2013-03-29: qty 50
  Filled 2013-03-29 (×9): qty 100
  Filled 2013-03-29: qty 50
  Filled 2013-03-29: qty 100
  Filled 2013-03-29 (×2): qty 50
  Filled 2013-03-29: qty 100
  Filled 2013-03-29: qty 50
  Filled 2013-03-29 (×2): qty 100

## 2013-03-29 MED ORDER — MIDAZOLAM HCL 2 MG/2ML IJ SOLN
1.0000 mg | INTRAMUSCULAR | Status: DC | PRN
  Administered 2013-03-29: 2 mg via INTRAVENOUS

## 2013-03-29 MED ORDER — DEXMEDETOMIDINE HCL IN NACL 200 MCG/50ML IV SOLN: 0.4000 ug/kg/h | INTRAVENOUS | Status: DC

## 2013-03-29 MED ORDER — VITAL HIGH PROTEIN PO LIQD
1000.0000 mL | ORAL | Status: DC
  Administered 2013-03-29: 1000 mL
  Administered 2013-03-30 (×2)
  Administered 2013-03-30 – 2013-03-31 (×2): 1000 mL
  Filled 2013-03-29 (×5): qty 1000

## 2013-03-29 MED ORDER — PRO-STAT SUGAR FREE PO LIQD
30.0000 mL | Freq: Three times a day (TID) | ORAL | Status: DC
  Administered 2013-03-29 – 2013-03-31 (×8): 30 mL
  Filled 2013-03-29 (×11): qty 30

## 2013-03-29 MED ORDER — MIDAZOLAM HCL 2 MG/2ML IJ SOLN: 2.0000 mg | INTRAMUSCULAR | Status: DC | PRN

## 2013-03-29 MED ORDER — MIDAZOLAM HCL 2 MG/2ML IJ SOLN
INTRAMUSCULAR | Status: AC
  Filled 2013-03-29: qty 2

## 2013-03-29 NOTE — Progress Notes (Signed)
Subjective:  Events noted.  Extubated briefly but failed, now back on vent. CRRT running very well, nearly 5 liters negative- but CXR does not look that much better by my read ? Objective Vital signs in last 24 hours: Filed Vitals:   03/29/13 0418 03/29/13 0500 03/29/13 0600 03/29/13 0700  BP:  131/64 145/67   Pulse:  87 88 87  Temp: 98.8 F (37.1 C)     TempSrc: Oral     Resp:  28 23 25   Height:      Weight:  123.4 kg (272 lb 0.8 oz)    SpO2:  100% 100% 100%   Weight change: -7.7 kg (-16 lb 15.6 oz)  Intake/Output Summary (Last 24 hours) at 03/29/13 0726 Last data filed at 03/29/13 0700  Gross per 24 hour  Intake    709 ml  Output   5443 ml  Net  -4734 ml    Assessment/ Plan: Pt is [name] 42 y.o. yo female who was admitted on 03/23/2013 with  DKA/buttock abscess and  [name] on CRF  Assessment/Plan: 1. Renal- [name] on CRF- has worsened and is now oliguric-  requiring renal replacement with CRRT mostly for volume.   Using heparin, uf to 150-200 per hour and all dialysate fluid.  I am hopeful that she will eventually recover her renal function- baseline creatinine in the low 2's.  I want to leave her on CRRT for now so we can get her off the vent sooner hopefully.  If evidence of hypotension or tachycardia will stop and watch 2. HTN/volume- overloaded, BP marginal- have stopped her antihypertensives so we can be more successful with volume removal,  rate of removal to 150-200 cc per hour- no hypotension or tachycardia yet 3. Anemia- significant- probably not helping her SOB/hypoxia- s/p one unit PRBC -is on ESA as well 4. Acidosis- improved 5. Elytes- on all 4 K bath-  6. Pulm- VDRF- per CCM- also  trying volume removal, now on no abx 7. Buttock abscess- antibiotics stopped-    Jasmine [name]    Labs: Basic Metabolic Panel:  Recent Labs Lab 03/28/13 0400 03/28/13 1600 03/29/13 0500  NA 139 139 139  K 3.4* 4.0 3.9  CL 101 99 99  CO2 21 20 22   GLUCOSE 113* 160* 137*  BUN 59*  44* 35*  CREATININE 2.50* 2.10* 2.05*  CALCIUM 8.6 8.7 9.2  PHOS 3.5 3.5 2.9   Liver Function Tests:  Recent Labs Lab 03/28/13 0400 03/28/13 1600 03/29/13 0500  ALBUMIN 2.2* 2.3* 2.4*   No results found for this basename: LIPASE, AMYLASE,  in the last 168 hours No results found for this basename: AMMONIA,  in the last 168 hours CBC:  Recent Labs Lab 03/23/13 1227  03/25/13 1005 03/26/13 0635 03/27/13 0330 03/28/13 0400 03/29/13 0500  WBC 21.8*  < > 18.4* 15.9* 14.2* 10.8* 14.1*  NEUTROABS 19.3*  --  16.8*  --  12.7*  --   --   HGB 8.5*  < > 7.4* 6.6* 7.0* 7.3* 7.7*  HCT 25.3*  < > 21.8* 19.6* 20.6* 21.5* 23.5*  MCV 87.2  < > 86.5 86.0 85.5 84.0 86.1  PLT 241  < > 211 224 238 276 324  < > = values in this interval not displayed. Cardiac Enzymes:  Recent Labs Lab 03/24/13 0700 03/24/13 1200 03/24/13 1826 03/27/13 0330 03/27/13 0825  TROPONINI <0.30 <0.30 <0.30 <0.30 0.31*   CBG:  Recent Labs Lab 03/28/13 1208 03/28/13 1525 03/28/13 1907 03/29/13 0002  03/29/13 0417  GLUCAP 134* 152* 128* 113* 131*    Iron Studies: No results found for this basename: IRON, TIBC, TRANSFERRIN, FERRITIN,  in the last 72 hours Studies/Results: Dg Chest Port 1 View  03/28/2013   CLINICAL DATA:  Hypoxia  EXAM: PORTABLE CHEST - 1 VIEW  COMPARISON:  Study obtained earlier in the day  FINDINGS: Endotracheal tube tip is 5.1 cm above the carina. Nasogastric tube tip and side port are in the stomach. Central catheter tip is in the superior vena cava. No pneumothorax.  There is increased and alveolar edema bilaterally compared to earlier in the day. Heart is mildly prominent with normal pulmonary vascularity.  IMPRESSION: Increase in alveolar edema compared earlier in the day. Question degree of ARDS. Aspiration could present similarly and must be [name] differential consideration.  Tube and catheter positions as described without pneumothorax.   Electronically Signed   By: Bretta BangWilliam  Woodruff M.D.    On: 03/28/2013 14:08   Dg Chest Port 1 View  03/28/2013   CLINICAL DATA:  Hypoxia  EXAM: PORTABLE CHEST - 1 VIEW  COMPARISON:  March 27, 2013  FINDINGS: Endotracheal tube tip is 5.6 cm above the carinal. Nasogastric tube tip and side port are in the stomach. Central catheter tip is in the superior vena cava. No pneumothorax.  Compared to 1 day prior, there has been significant clearing of alveolar edema. Mild interstitial edema remains. There is no airspace consolidation. Heart is prominent with normal pulmonary vascularity, stable.  IMPRESSION: Significant interval clearing of alveolar edema. Patchy interstitial edema remains bilaterally, primarily on the right. Tube and catheter positions as described without pneumothorax. Cardiac prominence remains without change.   Electronically Signed   By: Bretta BangWilliam  Woodruff M.D.   On: 03/28/2013 07:09   Medications: Infusions: . sodium chloride 250 mL (03/28/13 1100)  . fentaNYL infusion INTRAVENOUS 200 mcg/hr (03/29/13 0114)  . heparin 10,000 units/ 20 mL infusion syringe 2,500 Units/hr (03/29/13 0700)  . dialysis replacement fluid (prismasate) 250 mL/hr at 03/28/13 1056  . dialysis replacement fluid (prismasate) 250 mL/hr at 03/28/13 1111  . dialysate (PRISMASATE) 1,500 mL/hr at 03/29/13 0437    Scheduled Medications: . antiseptic oral rinse  15 mL Mouth Rinse QID  . aspirin  81 mg Oral Daily  . atorvastatin  40 mg Oral q1800  . chlorhexidine  15 mL Mouth Rinse BID  . darbepoetin (ARANESP) injection - DIALYSIS  100 mcg Intravenous Q Tue-HD  . heparin  5,000 Units Subcutaneous 3 times per day  . insulin aspart  0-20 Units Subcutaneous Q4H  . insulin glargine  10 Units Subcutaneous Q12H    have reviewed scheduled and prn medications.  Physical Exam: General: alert on vent Heart: RRR Lungs: CBS bilat Abdomen: soft, non tender Extremities: pitting edema- although much better    03/29/2013,7:26 AM  LOS: 6 days

## 2013-03-29 NOTE — Progress Notes (Signed)
INITIAL NUTRITION ASSESSMENT  DOCUMENTATION CODES Per approved criteria  -Morbid Obesity   INTERVENTION:  Utilize 57M PEPuP Protocol: initiate TF via OGT with Vital High Protein at 25 ml/h and Prostat 30 ml TID on day 1; on day 2, increase to goal rate of 50 ml/h (1200 ml per day) to provide 1500 kcals (69% of estimated needs and 24 kcals/kg ideal weight), 150 gm protein, 1003 ml free water daily.  NUTRITION DIAGNOSIS: Inadequate oral intake related to inability to eat as evidenced by NPO status.   Goal: Enteral nutrition to provide 60-70% of estimated calorie needs (22-25 kcals/kg ideal body weight) and 100% of estimated protein needs, based on ASPEN guidelines for permissive underfeeding in critically ill obese individuals.  Monitor:  TF tolerance/adequacy, weight trend, labs, vent status.  Reason for Assessment: MD Consult for TF initiation and management.  43 y.o. female  Admitting Dx: DKA (diabetic ketoacidoses)  ASSESSMENT: Patient is a 43 y.o. female with a past medical history of poorly controlled insulin-dependent diabetes mellitus, nonobstructive coronary artery disease, hypertension, stage III chronic kidney disease, presenting to the emergency department with complaints of elevated blood sugars. In the ED, found to have left buttock abscess, S/P I&D.  Patient was extubated briefly yesterday, but required re-intubation. Currently weaning.  Receiving CRRT. Volume overloaded.   Patient is currently intubated on ventilator support.  MV: 9.2 L/min Temp (24hrs), Avg:98.5 F (36.9 C), Min:97.9 F (36.6 C), Max:98.9 F (37.2 C)   Height: Ht Readings from Last 1 Encounters:  03/27/13 5\' 7"  (1.702 m)    Weight: Wt Readings from Last 1 Encounters:  03/29/13 272 lb 0.8 oz (123.4 kg)    Ideal Body Weight: 61.4 kg  % Ideal Body Weight: 201%  Wt Readings from Last 10 Encounters:  03/29/13 272 lb 0.8 oz (123.4 kg)  12/06/12 262 lb (118.842 kg)  11/01/12 271 lb 12 oz  (123.265 kg)  10/30/12 272 lb (123.378 kg)  10/25/12 272 lb 12.8 oz (123.741 kg)  10/22/12 266 lb 12.1 oz (121 kg)  07/24/12 273 lb (123.832 kg)  03/30/12 278 lb 1.6 oz (126.145 kg)  02/23/12 276 lb (125.193 kg)  01/27/12 276 lb 6.4 oz (125.374 kg)    Usual Body Weight: 262-272 lb  % Usual Body Weight: 100%  BMI:  Body mass index is 42.6 kg/(m^2). class 3, extreme/morbid obesity  Estimated Nutritional Needs: Kcal: 2137 Protein: 123-153 gm Fluid: 2.1-2.2 L  Skin: left buttock abscess S/P I&D  Diet Order:  NPO  EDUCATION NEEDS: -Education not appropriate at this time   Intake/Output Summary (Last 24 hours) at 03/29/13 1351 Last data filed at 03/29/13 1300  Gross per 24 hour  Intake 722.21 ml  Output   5403 ml  Net -4680.79 ml    Last BM: 3/4   Labs:   Recent Labs Lab 03/27/13 0330  03/28/13 0400 03/28/13 1600 03/29/13 0500  NA 135*  < > 139 139 139  K 3.6*  < > 3.4* 4.0 3.9  CL 98  < > 101 99 99  CO2 14*  < > 21 20 22   BUN 83*  < > 59* 44* 35*  CREATININE 3.65*  < > 2.50* 2.10* 2.05*  CALCIUM 8.7  < > 8.6 8.7 9.2  MG 2.1  --  2.3  --  2.7*  PHOS 5.8*  < > 3.5 3.5 2.9  GLUCOSE 296*  < > 113* 160* 137*  < > = values in this interval not displayed.  CBG (last  3)   Recent Labs  03/29/13 0417 03/29/13 0755 03/29/13 1143  GLUCAP 131* 140* 122*    Scheduled Meds: . antiseptic oral rinse  15 mL Mouth Rinse QID  . aspirin  81 mg Oral Daily  . atorvastatin  40 mg Oral q1800  . chlorhexidine  15 mL Mouth Rinse BID  . darbepoetin (ARANESP) injection - DIALYSIS  100 mcg Intravenous Q Tue-HD  . heparin  5,000 Units Subcutaneous 3 times per day  . insulin aspart  0-20 Units Subcutaneous Q4H  . insulin glargine  10 Units Subcutaneous Q12H  . metoprolol tartrate  12.5 mg Oral BID  . pantoprazole (PROTONIX) IV  40 mg Intravenous Q24H    Continuous Infusions: . sodium chloride 250 mL (03/28/13 1100)  . dexmedetomidine 0.4 mcg/kg/hr (03/29/13 1117)  .  heparin 10,000 units/ 20 mL infusion syringe 2,400 Units/hr (03/29/13 1322)  . dialysis replacement fluid (prismasate) 250 mL/hr at 03/29/13 0728  . dialysis replacement fluid (prismasate) 250 mL/hr at 03/29/13 0740  . dialysate (PRISMASATE) 1,500 mL/hr at 03/29/13 1200    Past Medical History  Diagnosis Date  . Diastolic heart failure     echo 11/03/10 EF 55-60%  . Hypertension   . Chronic kidney disease (CKD)     stage II-III  . Hyperlipidemia   . Uncontrolled diabetes mellitus   . Diabetic ketoacidosis     with seizures  . Anemia   . Diabetic gastroparesis   . Obesity   . Coronary artery disease     mild per cath 2010  . 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 UTI (lower urinary tract infection) 01/19/2012  . Pneumonia due to unspecified Streptococcus 10/16/2012  . Acute on chronic diastolic heart failure 01/18/2012  . Acute on chronic renal failure 01/18/2012  . Acute respiratory failure with hypoxia 10/08/2012    Past Surgical History  Procedure Laterality Date  . Cholecystectomy    . Carpal tunnel release  2003    Joaquin CourtsKimberly Kai Calico, RD, LDN, CNSC Pager [phone] After Hours Pager [phone]\

## 2013-03-29 NOTE — Progress Notes (Signed)
PULMONARY / CRITICAL CARE MEDICINE  Name: Jasmine [name] MRN: [id number] DOB: [birth year]    ADMISSION DATE:  03/23/2013 CONSULTATION DATE:  03/29/2013  REFERRING MD :  [facility] PRIMARY SERVICE: PCCM  CHIEF COMPLAINT:  Dsypnea  BRIEF PATIENT DESCRIPTION: 43 yo with poorly controlled insulin-dependent diabetes mellitus admitted 2/27 for L buttock abscess, sepsis and DKA. Intubated for hypoxemic respiratory failure.  SIGNIFICANT EVENTS / STUDIES:  2/27  Incision and drainage of left buttock abscess 3/1    CT abdomen >>> ASD BLL, small BL effusions, IUD 3/3    Intubated 3/4    Transferred to [facility] , CVVHD started 3/4     Failed extubation >>> anxiety, tachycardia, pulmonary edema >>> reintubated  LINES / TUBES: OETT 3/3 >>> 3/4; 3/4 >>> OGT  3/3 >>> 3/4; 3/4 >>> R IJ HD cath 3/3 >>> Foley ??? >>>  CULTURES: 2/27  MRCA PCR >>> neg 2/27  Blood >>> neg 2/27  Wound >>>  PROTEUS  ANTIBIOTICS: Vancomycin  2/27 >>> 3/4 Zosyn  2/27 >>> 3/5 Levaquin 3/1 >>> 3/4 Ceftriaxone 3/5 >>>  INTERVAL HISTORY:  Tolerating SBT great this am.  VITAL SIGNS: Temp:  [97.9 F (36.6 C)-98.8 F (37.1 C)] 98.7 F (37.1 C) (03/05 0842) Pulse Rate:  [87-116] 92 (03/05 1000) Resp:  [16-41] 22 (03/05 1000) BP: (101-146)/(46-75) 142/65 mmHg (03/05 1000) SpO2:  [87 %-100 %] 99 % (03/05 1000) FiO2 (%):  [40 %-100 %] 40 % (03/05 1000) Weight:  [123.4 kg (272 lb 0.8 oz)] 123.4 kg (272 lb 0.8 oz) (03/05 0500)  HEMODYNAMICS:   VENTILATOR SETTINGS: Vent Mode:  [-] PSV;CPAP FiO2 (%):  [40 %-100 %] 40 % Set Rate:  [28 bmp] 28 bmp Vt Set:  [430 mL] 430 mL PEEP:  [5 cmH20] 5 cmH20 Pressure Support:  [5 cmH20] 5 cmH20 Plateau Pressure:  [20 cmH20-27 cmH20] 27 cmH20  INTAKE / OUTPUT: Intake/Output     03/04 0701 - 03/05 0700 03/05 0701 - 03/06 0700   I.V. (mL/kg) 484 (3.9) 129.9 (1.1)   Other 10    NG/GT 80    IV Piggyback 135    Total Intake(mL/kg) 709 (5.7) 129.9 (1.1)   Urine (mL/kg/hr) 240 (0.1) 15 (0)    Other 5203 (1.8) 792 (1.6)   Total Output 5443 807   Net -4734 -677.1        Stool Occurrence 1 x      PHYSICAL EXAMINATION: Gen: No distress ENT: ETT / OGT Neck: No JVD Lungs: Bilateral air entry, few rales Cardiovascular: Regular, no murmurs Abdomen: Obese, soft Musculoskeletal: Moves all extremities Neuro:  Awake, cooperative Skin:  Warm, no lesions / rash  LABS:  CBC  Recent Labs Lab 03/27/13 0330 03/28/13 0400 03/29/13 0500  WBC 14.2* 10.8* 14.1*  HGB 7.0* 7.3* 7.7*  HCT 20.6* 21.5* 23.5*  PLT 238 276 324   Coag's  Recent Labs Lab 03/23/13 1919 03/28/13 0400 03/29/13 0500  APTT  --  54* 185*  INR 1.22  --   --    BMET  Recent Labs Lab 03/28/13 0400 03/28/13 1600 03/29/13 0500  NA 139 139 139  K 3.4* 4.0 3.9  CL 101 99 99  CO2 21 20 22   BUN 59* 44* 35*  CREATININE 2.50* 2.10* 2.05*  GLUCOSE 113* 160* 137*   Electrolytes  Recent Labs Lab 03/27/13 0330  03/28/13 0400 03/28/13 1600 03/29/13 0500  CALCIUM 8.7  < > 8.6 8.7 9.2  MG 2.1  --  2.3  --  2.7*  PHOS 5.8*  < > 3.5 3.5 2.9  < > = values in this interval not displayed. Sepsis Markers  Recent Labs Lab 03/23/13 1317 03/25/13 1610 03/25/13 1611 03/26/13 0635 03/27/13 0330 03/27/13 1120  LATICACIDVEN 2.53* 1.0  --   --   --  1.1  PROCALCITON  --   --  3.25 4.04 3.61  --    ABG  Recent Labs Lab 03/27/13 1213 03/27/13 1423 03/28/13 1505  PHART 7.345* 7.343* 7.378  PCO2ART 30.2* 30.4* 34.6*  PO2ART 65.7* 81.0 101.0*   Liver Enzymes  Recent Labs Lab 03/28/13 0400 03/28/13 1600 03/29/13 0500  ALBUMIN 2.2* 2.3* 2.4*   Cardiac Enzymes  Recent Labs Lab 03/24/13 1200 03/24/13 1826 03/27/13 0330 03/27/13 0825  TROPONINI <0.30 <0.30 <0.30 0.31*  PROBNP 6479.0*  --   --   --    Glucose  Recent Labs Lab 03/28/13 1208 03/28/13 1525 03/28/13 1907 03/29/13 0002 03/29/13 0417 03/29/13 0755  GLUCAP 134* 152* 128* 113* 131* 140*   IMAGING:   Dg Chest Port  1 View  03/29/2013   CLINICAL DATA:  Pneumonia and respiratory failure.  EXAM: PORTABLE CHEST - 1 VIEW  COMPARISON:  DG CHEST 1V PORT dated 03/28/2013; DG CHEST 1V PORT dated 03/28/2013; DG CHEST 1V PORT dated 03/27/2013  FINDINGS: Endotracheal tube remains present with the tip approximately 3 cm above the carina. Temporary dialysis catheter shows stable positioning. Nasogastric tube extends below the diaphragm. Lungs show some improvement in aeration bilaterally with residual edema/ airspace disease present. No significant pleural fluid is identified. The heart size is stable.  IMPRESSION: Improved aeration of both lungs.   Electronically Signed   By: Irish Lack M.D.   On: 03/29/2013 08:09   Dg Chest Port 1 View  03/28/2013   CLINICAL DATA:  Hypoxia  EXAM: PORTABLE CHEST - 1 VIEW  COMPARISON:  Study obtained earlier in the day  FINDINGS: Endotracheal tube tip is 5.1 cm above the carina. Nasogastric tube tip and side port are in the stomach. Central catheter tip is in the superior vena cava. No pneumothorax.  There is increased and alveolar edema bilaterally compared to earlier in the day. Heart is mildly prominent with normal pulmonary vascularity.  IMPRESSION: Increase in alveolar edema compared earlier in the day. Question degree of ARDS. Aspiration could present similarly and must be a differential consideration.  Tube and catheter positions as described without pneumothorax.   Electronically Signed   By: Bretta Bang M.D.   On: 03/28/2013 14:08   Dg Chest Port 1 View  03/28/2013   CLINICAL DATA:  Hypoxia  EXAM: PORTABLE CHEST - 1 VIEW  COMPARISON:  March 27, 2013  FINDINGS: Endotracheal tube tip is 5.6 cm above the carinal. Nasogastric tube tip and side port are in the stomach. Central catheter tip is in the superior vena cava. No pneumothorax.  Compared to 1 day prior, there has been significant clearing of alveolar edema. Mild interstitial edema remains. There is no airspace consolidation. Heart is  prominent with normal pulmonary vascularity, stable.  IMPRESSION: Significant interval clearing of alveolar edema. Patchy interstitial edema remains bilaterally, primarily on the right. Tube and catheter positions as described without pneumothorax. Cardiac prominence remains without change.   Electronically Signed   By: Bretta Bang M.D.   On: 03/28/2013 07:09   ASSESSMENT / PLAN:  PULMONARY A: Acute respiratory failure in setting in setting of pulmonary edema / pneumonia / metabolic acidosis Acute pulmonary edema Possible pneumonia P:  Goal pH>7.30, SpO2>92 Continuous mechanical support VAP bundle Daily SBT on T-piece (!) Trend ABG/CXR Albuterol PRN  CARDIOVASCULAR A: Acute on chronic diastolic heart failure Tachycardia, likely contributing to flash edema in setting of diastolic failure P:  Lipitor, ASA Add Metoprolol 12.5 bid  RENAL A:   Acute on chronic renal failure Stay balance neg 6 L P:   Renal Following Trend BMP CVVHD  GASTROINTESTINAL A:   GI Px Nutrition P:   TF Protonix  HEMATOLOGIC A:   Anemia P:  Trend CBC Transfuse for Hgb < 7.0 Aranesp  INFECTIOUS A:   Left buttock abscess with PROTEUS P:   Simplify abx to Ceftriaxone ( total of 10 days )  ENDOCRINE A:    DM DKA resolved P:   SSI Lantus 10  NEUROLOGIC A:   Severe anxiety contributing to tachycardia / pulmonary edema / respiratory distress P:   D/c Fentanyl Start Precedex Ativan PRN  I have personally obtained history, examined patient, evaluated and interpreted laboratory and imaging results, reviewed medical records, formulated assessment / plan and placed orders.  CRITICAL CARE:  The patient is critically ill with multiple organ systems failure and requires high complexity decision making for assessment and support, frequent evaluation and titration of therapies, application of advanced monitoring technologies and extensive interpretation of multiple databases. Critical  Care Time devoted to patient care services described in this note is 35 minutes.   Lonia FarberZUBELEVITSKIY, Soua Caltagirone, MD Pulmonary and Critical Care Medicine [facility] Pager: [phone]  03/29/2013, 11:05 AM

## 2013-03-29 NOTE — Progress Notes (Signed)
Patient placed on 40% T-bar per MD request. Patient tolerating well at this time. RT will continue to monitor.

## 2013-03-30 ENCOUNTER — Inpatient Hospital Stay (HOSPITAL_COMMUNITY): Payer: 59

## 2013-03-30 LAB — GLUCOSE, CAPILLARY
GLUCOSE-CAPILLARY: 194 mg/dL — AB (ref 70–99)
GLUCOSE-CAPILLARY: 165 mg/dL — AB (ref 70–99)
Glucose-Capillary: 135 mg/dL — ABNORMAL HIGH (ref 70–99)
Glucose-Capillary: 180 mg/dL — ABNORMAL HIGH (ref 70–99)
Glucose-Capillary: 192 mg/dL — ABNORMAL HIGH (ref 70–99)
Glucose-Capillary: 200 mg/dL — ABNORMAL HIGH (ref 70–99)

## 2013-03-30 LAB — RENAL FUNCTION PANEL
ALBUMIN: 2.7 g/dL — AB (ref 3.5–5.2)
BUN: 37 mg/dL — AB (ref 6–23)
CALCIUM: 9.7 mg/dL (ref 8.4–10.5)
CO2: 26 mEq/L (ref 19–32)
Chloride: 96 mEq/L (ref 96–112)
Creatinine, Ser: 2.35 mg/dL — ABNORMAL HIGH (ref 0.50–1.10)
GFR calc Af Amer: 28 mL/min — ABNORMAL LOW (ref >90)
GFR calc non Af Amer: 24 mL/min — ABNORMAL LOW (ref >90)
Glucose, Bld: 179 mg/dL — ABNORMAL HIGH (ref 70–99)
PHOSPHORUS: 4.6 mg/dL (ref 2.3–4.6)
POTASSIUM: 3.9 meq/L (ref 3.7–5.3)
Sodium: 135 mEq/L — ABNORMAL LOW (ref 137–147)

## 2013-03-30 LAB — BETA-HYDROXYBUTYRIC ACID: Beta-Hydroxybutyric Acid: 0.11 mmol/L

## 2013-03-30 LAB — POCT ACTIVATED CLOTTING TIME
Activated Clotting Time: 215 seconds
Activated Clotting Time: 221 seconds
Activated Clotting Time: 232 seconds

## 2013-03-30 LAB — CBC
HCT: 26.5 % — ABNORMAL LOW (ref 36.0–46.0)
Hemoglobin: 8.7 g/dL — ABNORMAL LOW (ref 12.0–15.0)
MCH: 29 pg (ref 26.0–34.0)
MCHC: 32.8 g/dL (ref 30.0–36.0)
MCV: 88.3 fL (ref 78.0–100.0)
PLATELETS: 353 10*3/uL (ref 150–400)
RBC: 3 MIL/uL — ABNORMAL LOW (ref 3.87–5.11)
RDW: 14.9 % (ref 11.5–15.5)
WBC: 16.4 10*3/uL — ABNORMAL HIGH (ref 4.0–10.5)

## 2013-03-30 LAB — BASIC METABOLIC PANEL
BUN: 31 mg/dL — ABNORMAL HIGH (ref 6–23)
CO2: 23 mEq/L (ref 19–32)
CREATININE: 2.01 mg/dL — AB (ref 0.50–1.10)
Calcium: 9.5 mg/dL (ref 8.4–10.5)
Chloride: 96 mEq/L (ref 96–112)
GFR, EST AFRICAN AMERICAN: 34 mL/min — AB (ref >90)
GFR, EST NON AFRICAN AMERICAN: 29 mL/min — AB (ref >90)
Glucose, Bld: 205 mg/dL — ABNORMAL HIGH (ref 70–99)
Potassium: 3.8 mEq/L (ref 3.7–5.3)
Sodium: 136 mEq/L — ABNORMAL LOW (ref 137–147)

## 2013-03-30 LAB — MAGNESIUM: Magnesium: 2.7 mg/dL — ABNORMAL HIGH (ref 1.5–2.5)

## 2013-03-30 LAB — APTT

## 2013-03-30 MED ORDER — PANTOPRAZOLE SODIUM 40 MG PO PACK
40.0000 mg | PACK | Freq: Every day | ORAL | Status: DC
  Administered 2013-03-30 – 2013-03-31 (×2): 40 mg
  Filled 2013-03-30 (×3): qty 20

## 2013-03-30 MED ORDER — FUROSEMIDE 10 MG/ML IJ SOLN
160.0000 mg | Freq: Once | INTRAVENOUS | Status: AC
  Administered 2013-03-30: 160 mg via INTRAVENOUS
  Filled 2013-03-30: qty 16

## 2013-03-30 MED ORDER — INSULIN GLARGINE 100 UNIT/ML ~~LOC~~ SOLN
25.0000 [IU] | Freq: Two times a day (BID) | SUBCUTANEOUS | Status: DC
  Administered 2013-03-31: 25 [IU] via SUBCUTANEOUS
  Filled 2013-03-30 (×2): qty 0.25

## 2013-03-30 NOTE — Progress Notes (Signed)
CRITICAL VALUE ALERT  Critical value received:  ptt >200  Date of notification:  03/30/13  Time of notification:  0650  Critical value read back:yes  Nurse who received alert:  A.Canary BrimSperry RN  MD notified (1st page):  Consistent with previous  Time of first page:    MD notified (2nd page):  Time of second page:  Responding MD:   Time MD responded:

## 2013-03-30 NOTE — Progress Notes (Signed)
Patient ID: Jasmine [name], female   DOB: 1970/07/02, 43 y.o.   MRN: [id number]    Subjective: Pt still intubated.  Minimal response   Objective: Vital signs in last 24 hours: Temp:  [96.4 F (35.8 C)-98.9 F (37.2 C)] 96.4 F (35.8 C) (03/06 0418) Pulse Rate:  [51-107] 53 (03/06 0830) Resp:  [15-31] 25 (03/06 0830) BP: (104-148)/(47-103) 133/62 mmHg (03/06 0830) SpO2:  [92 %-100 %] 100 % (03/06 0830) FiO2 (%):  [40 %-50 %] 40 % (03/06 0800) Weight:  [264 lb 8.8 oz (120 kg)] 264 lb 8.8 oz (120 kg) (03/06 0500) Last BM Date: 03/28/13  Intake/Output from previous day: 03/05 0701 - 03/06 0700 In: 1611.3 [I.V.:980.4; NG/GT:580.8; IV Piggyback:50] Out: 6340 [Urine:55] Intake/Output this shift: Total I/O In: 84 [I.V.:62.9; NG/GT:21.1] Out: 305 [Other:305]  PE: Skin: buttock wound is clean and packed; however, there is some skin breakdown right next to this likely from pressure.  Lab Results:   Recent Labs  03/29/13 0500 03/30/13 0500  WBC 14.1* 16.4*  HGB 7.7* 8.7*  HCT 23.5* 26.5*  PLT 324 353   BMET  Recent Labs  03/29/13 1615 03/30/13 0500  NA 137 136*  K 3.8 3.8  CL 98 96  CO2 23 23  GLUCOSE 135* 205*  BUN 31* 31*  CREATININE 2.06* 2.01*  CALCIUM 9.0 9.5   PT/INR No results found for this basename: LABPROT, INR,  in the last 72 hours CMP     Component Value Date/Time   NA 136* 03/30/2013 0500   K 3.8 03/30/2013 0500   CL 96 03/30/2013 0500   CO2 23 03/30/2013 0500   GLUCOSE 205* 03/30/2013 0500   BUN 31* 03/30/2013 0500   CREATININE 2.01* 03/30/2013 0500   CALCIUM 9.5 03/30/2013 0500   PROT 6.9 10/11/2012 0400   ALBUMIN 2.5* 03/29/2013 1615   AST 12 10/11/2012 0400   ALT 11 10/11/2012 0400   ALKPHOS 117 10/11/2012 0400   BILITOT 0.2* 10/11/2012 0400   GFRNONAA 29* 03/30/2013 0500   GFRAA 34* 03/30/2013 0500   Lipase     Component Value Date/Time   LIPASE 23 08/05/2009 1323       Studies/Results: Dg Chest Port 1 View  03/30/2013   CLINICAL DATA:  43 year old  female intubated. Heart failure. Sepsis. Initial encounter.  EXAM: PORTABLE CHEST - 1 VIEW  COMPARISON:  03/29/2013 and earlier.  FINDINGS: Portable AP semi upright view at 0434 hrs. Stable endotracheal tube tip at the level the clavicles. Stable enteric tube, tip not included. Stable right IJ central line.  Decreased pulmonary interstitial opacity and improved definition of the pulmonary vasculature. No pneumothorax. No large effusion. Mildly improved retrocardiac ventilation with residual opacity.  IMPRESSION: 1.  Stable lines and tubes. 2. Regressed pulmonary edema. Improved basilar ventilation with residual retrocardiac atelectasis or consolidation.   Electronically Signed   By: Augusto Gamble M.D.   On: 03/30/2013 07:52   Dg Chest Port 1 View  03/29/2013   CLINICAL DATA:  Pneumonia and respiratory failure.  EXAM: PORTABLE CHEST - 1 VIEW  COMPARISON:  DG CHEST 1V PORT dated 03/28/2013; DG CHEST 1V PORT dated 03/28/2013; DG CHEST 1V PORT dated 03/27/2013  FINDINGS: Endotracheal tube remains present with the tip approximately 3 cm above the carina. Temporary dialysis catheter shows stable positioning. Nasogastric tube extends below the diaphragm. Lungs show some improvement in aeration bilaterally with residual edema/ airspace disease present. No significant pleural fluid is identified. The heart size is stable.  IMPRESSION:  Improved aeration of both lungs.   Electronically Signed   By: Irish LackGlenn  Yamagata M.D.   On: 03/29/2013 08:09   Dg Chest Port 1 View  03/28/2013   CLINICAL DATA:  Hypoxia  EXAM: PORTABLE CHEST - 1 VIEW  COMPARISON:  Study obtained earlier in the day  FINDINGS: Endotracheal tube tip is 5.1 cm above the carina. Nasogastric tube tip and side port are in the stomach. Central catheter tip is in the superior vena cava. No pneumothorax.  There is increased and alveolar edema bilaterally compared to earlier in the day. Heart is mildly prominent with normal pulmonary vascularity.  IMPRESSION: Increase in  alveolar edema compared earlier in the day. Question degree of ARDS. Aspiration could present similarly and must be a differential consideration.  Tube and catheter positions as described without pneumothorax.   Electronically Signed   By: Bretta BangWilliam  Woodruff M.D.   On: 03/28/2013 14:08    Anti-infectives: Anti-infectives   Start     Dose/Rate Route Frequency Ordered Stop   03/29/13 1500  cefTRIAXone (ROCEPHIN) 2 g in dextrose 5 % 50 mL IVPB     2 g 100 mL/hr over 30 Minutes Intravenous Every 24 hours 03/29/13 1439     03/28/13 2100  vancomycin (VANCOCIN) 1,250 mg in sodium chloride 0.9 % 250 mL IVPB  Status:  Discontinued     1,250 mg 166.7 mL/hr over 90 Minutes Intravenous Every 24 hours 03/28/13 1120 03/28/13 1125   03/28/13 2000  Levofloxacin (LEVAQUIN) IVPB 250 mg  Status:  Discontinued     250 mg 50 mL/hr over 60 Minutes Intravenous Every 24 hours 03/28/13 1118 03/28/13 1125   03/27/13 2000  vancomycin (VANCOCIN) 1,750 mg in sodium chloride 0.9 % 500 mL IVPB  Status:  Discontinued     1,750 mg 250 mL/hr over 120 Minutes Intravenous Every 48 hours 03/26/13 0931 03/28/13 1120   03/25/13 1800  levofloxacin (LEVAQUIN) IVPB 750 mg  Status:  Discontinued     750 mg 100 mL/hr over 90 Minutes Intravenous Every 48 hours 03/25/13 1625 03/28/13 1116   03/24/13 2000  vancomycin (VANCOCIN) 1,750 mg in sodium chloride 0.9 % 500 mL IVPB  Status:  Discontinued     1,750 mg 250 mL/hr over 120 Minutes Intravenous Every 24 hours 03/23/13 1903 03/26/13 0931   03/23/13 2000  piperacillin-tazobactam (ZOSYN) IVPB 3.375 g  Status:  Discontinued     3.375 g 12.5 mL/hr over 240 Minutes Intravenous 3 times per day 03/23/13 1903 03/28/13 1123   03/23/13 2000  vancomycin (VANCOCIN) 2,000 mg in sodium chloride 0.9 % 500 mL IVPB     2,000 mg 250 mL/hr over 120 Minutes Intravenous  Once 03/23/13 1903 03/24/13 0005   03/23/13 1345  clindamycin (CLEOCIN) IVPB 600 mg     600 mg 100 mL/hr over 30 Minutes Intravenous   Once 03/23/13 1338 03/23/13 1514       Assessment/Plan  1. S/p I&D of buttock abscess now with some skin breakdown  Plan: 1. Cont BID dressing changes but cover with mepilex boarder instead of gauze and tape to help prevent further breakdown.   LOS: 7 days    Daviona Herbert E 03/30/2013, 8:57 AM Pager: [phone]

## 2013-03-30 NOTE — Progress Notes (Signed)
PULMONARY / CRITICAL CARE MEDICINE  Name: Jasmine [name] MRN: [id number] DOB: 1970/11/06    ADMISSION DATE:  03/23/2013 CONSULTATION DATE:  03/30/2013  REFERRING MD :  [facility] PRIMARY SERVICE: PCCM  CHIEF COMPLAINT:  Dsypnea  BRIEF PATIENT DESCRIPTION: 43 yo with poorly controlled insulin-dependent diabetes mellitus admitted 2/27 for L buttock abscess, sepsis and DKA. Intubated for hypoxemic respiratory failure.  SIGNIFICANT EVENTS / STUDIES:  2/27  Incision and drainage of left buttock abscess 3/1    CT abdomen >>> ASD BLL, small BL effusions, IUD 3/3    Intubated 3/4    Transferred to [facility] , CVVHD started 3/4     Failed extubation >>> anxiety, tachycardia, pulmonary edema >>> reintubated  LINES / TUBES: OETT 3/3 >>> 3/4; 3/4 >>> OGT  3/3 >>> 3/4; 3/4 >>> R IJ HD cath 3/3 >>> Foley ??? >>>  CULTURES: 2/27  MRCA PCR >>> neg 2/27  Blood >>> neg 2/27  Wound >>>  PROTEUS  ANTIBIOTICS: Vancomycin  2/27 >>> 3/4 Zosyn  2/27 >>> 3/5 Levaquin 3/1 >>> 3/4 Ceftriaxone 3/5 >>>  INTERVAL HISTORY:  Failed SBT due to tachypnea / agitation.  VITAL SIGNS: Temp:  [94.9 F (34.9 C)-98.8 F (37.1 C)] 94.9 F (34.9 C) (03/06 1239) Pulse Rate:  [50-107] 53 (03/06 1247) Resp:  [16-31] 21 (03/06 1247) BP: (104-148)/(47-71) 144/67 mmHg (03/06 1235) SpO2:  [92 %-100 %] 99 % (03/06 1247) FiO2 (%):  [40 %-50 %] 40 % (03/06 1247) Weight:  [120 kg (264 lb 8.8 oz)] 120 kg (264 lb 8.8 oz) (03/06 0500)  HEMODYNAMICS:   VENTILATOR SETTINGS: Vent Mode:  [-] PSV;CPAP FiO2 (%):  [40 %-50 %] 40 % Set Rate:  [28 bmp] 28 bmp Vt Set:  [430 mL] 430 mL PEEP:  [5 cmH20] 5 cmH20 Pressure Support:  [10 cmH20] 10 cmH20 Plateau Pressure:  [16 cmH20-23 cmH20] 22 cmH20  INTAKE / OUTPUT: Intake/Output     03/05 0701 - 03/06 0700 03/06 0701 - 03/07 0700   I.V. (mL/kg) 980.4 (8.2) 271.5 (2.3)   Other     NG/GT 580.8 159.1   IV Piggyback 50 66   Total Intake(mL/kg) 1611.3 (13.4) 496.6 (4.1)   Urine  (mL/kg/hr) 55 (0) 2 (0)   Other 6285 (2.2) 1217 (1.7)   Total Output 6340 1219   Net -4728.7 -722.4          PHYSICAL EXAMINATION: Gen: Resting comfortable ENT: ETT / OGT Neck: No JVD Lungs: CTAB Cardiovascular: Regular, no murmurs Abdomen: Obese, soft Musculoskeletal: Moves all extremities Neuro:  Sleepy but arouses to voice Skin:  Warm, no lesions / rash  LABS:  CBC  Recent Labs Lab 03/28/13 0400 03/29/13 0500 03/30/13 0500  WBC 10.8* 14.1* 16.4*  HGB 7.3* 7.7* 8.7*  HCT 21.5* 23.5* 26.5*  PLT 276 324 353   Coag's  Recent Labs Lab 03/23/13 1919 03/28/13 0400 03/29/13 0500 03/30/13 0500  APTT  --  54* 185* >200*  INR 1.22  --   --   --    BMET  Recent Labs Lab 03/29/13 0500 03/29/13 1615 03/30/13 0500  NA 139 137 136*  K 3.9 3.8 3.8  CL 99 98 96  CO2 22 23 23   BUN 35* 31* 31*  CREATININE 2.05* 2.06* 2.01*  GLUCOSE 137* 135* 205*   Electrolytes  Recent Labs Lab 03/28/13 0400 03/28/13 1600 03/29/13 0500 03/29/13 1615 03/30/13 0500  CALCIUM 8.6 8.7 9.2 9.0 9.5  MG 2.3  --  2.7*  --  2.7*  PHOS 3.5 3.5 2.9 3.1  --    Sepsis Markers  Recent Labs Lab 03/23/13 1317 03/25/13 1610 03/25/13 1611 03/26/13 0635 03/27/13 0330 03/27/13 1120  LATICACIDVEN 2.53* 1.0  --   --   --  1.1  PROCALCITON  --   --  3.25 4.04 3.61  --    ABG  Recent Labs Lab 03/27/13 1213 03/27/13 1423 03/28/13 1505  PHART 7.345* 7.343* 7.378  PCO2ART 30.2* 30.4* 34.6*  PO2ART 65.7* 81.0 101.0*   Liver Enzymes  Recent Labs Lab 03/28/13 1600 03/29/13 0500 03/29/13 1615  ALBUMIN 2.3* 2.4* 2.5*   Cardiac Enzymes  Recent Labs Lab 03/24/13 1200 03/24/13 1826 03/27/13 0330 03/27/13 0825  TROPONINI <0.30 <0.30 <0.30 0.31*  PROBNP 6479.0*  --   --   --    Glucose  Recent Labs Lab 03/29/13 1544 03/29/13 1947 03/30/13 0012 03/30/13 0402 03/30/13 0823 03/30/13 1145  GLUCAP 128* 132* 135* 194* 200* 192*   IMAGING:   Dg Chest Port 1  View  03/30/2013   CLINICAL DATA:  43 year old female intubated. Heart failure. Sepsis. Initial encounter.  EXAM: PORTABLE CHEST - 1 VIEW  COMPARISON:  03/29/2013 and earlier.  FINDINGS: Portable AP semi upright view at 0434 hrs. Stable endotracheal tube tip at the level the clavicles. Stable enteric tube, tip not included. Stable right IJ central line.  Decreased pulmonary interstitial opacity and improved definition of the pulmonary vasculature. No pneumothorax. No large effusion. Mildly improved retrocardiac ventilation with residual opacity.  IMPRESSION: 1.  Stable lines and tubes. 2. Regressed pulmonary edema. Improved basilar ventilation with residual retrocardiac atelectasis or consolidation.   Electronically Signed   By: Augusto GambleLee  Hall M.D.   On: 03/30/2013 07:52   Dg Chest Port 1 View  03/29/2013   CLINICAL DATA:  Pneumonia and respiratory failure.  EXAM: PORTABLE CHEST - 1 VIEW  COMPARISON:  DG CHEST 1V PORT dated 03/28/2013; DG CHEST 1V PORT dated 03/28/2013; DG CHEST 1V PORT dated 03/27/2013  FINDINGS: Endotracheal tube remains present with the tip approximately 3 cm above the carina. Temporary dialysis catheter shows stable positioning. Nasogastric tube extends below the diaphragm. Lungs show some improvement in aeration bilaterally with residual edema/ airspace disease present. No significant pleural fluid is identified. The heart size is stable.  IMPRESSION: Improved aeration of both lungs.   Electronically Signed   By: Irish LackGlenn  Yamagata M.D.   On: 03/29/2013 08:09   Dg Chest Port 1 View  03/28/2013   CLINICAL DATA:  Hypoxia  EXAM: PORTABLE CHEST - 1 VIEW  COMPARISON:  Study obtained earlier in the day  FINDINGS: Endotracheal tube tip is 5.1 cm above the carina. Nasogastric tube tip and side port are in the stomach. Central catheter tip is in the superior vena cava. No pneumothorax.  There is increased and alveolar edema bilaterally compared to earlier in the day. Heart is mildly prominent with normal  pulmonary vascularity.  IMPRESSION: Increase in alveolar edema compared earlier in the day. Question degree of ARDS. Aspiration could present similarly and must be a differential consideration.  Tube and catheter positions as described without pneumothorax.   Electronically Signed   By: Bretta BangWilliam  Woodruff M.D.   On: 03/28/2013 14:08   ASSESSMENT / PLAN:  PULMONARY A: Acute respiratory failure in setting in setting of pulmonary edema / pneumonia / metabolic acidosis Acute pulmonary edema Possible pneumonia P:   Goal pH>7.30, SpO2>92 Continuous mechanical support VAP bundle Daily SBT on T-piece (!) Trend ABG/CXR Albuterol PRN  CARDIOVASCULAR A: Acute on chronic diastolic heart failure Tachycardia, likely contributing to flash edema in setting of diastolic failure P:  Lipitor, ASA Metoprolol 12.5 bid  RENAL A:   Acute on chronic renal failure Stay balance neg 10 L P:   Renal Following Trend BMP CVVHD on hold  Lasix gtt  GASTROINTESTINAL A:   GI Px Nutrition P:   TF Protonix  HEMATOLOGIC A:   Anemia P:  Trend CBC Transfuse for Hgb < 7.0 Aranesp  INFECTIOUS A:   Left buttock abscess with PROTEUS P:   Surgery following Simplify abx to Ceftriaxone ( total of 10 days )  ENDOCRINE A:    DM Hyperglycemia DKA resolved P:   SSI Increase Lantus 25  NEUROLOGIC A:   Severe anxiety contributing to tachycardia / pulmonary edema / respiratory distress P:   Precedex gtt Fentanyl gtt Ativan PRN  I have personally obtained history, examined patient, evaluated and interpreted laboratory and imaging results, reviewed medical records, formulated assessment / plan and placed orders.  CRITICAL CARE:  The patient is critically ill with multiple organ systems failure and requires high complexity decision making for assessment and support, frequent evaluation and titration of therapies, application of advanced monitoring technologies and extensive interpretation of  multiple databases. Critical Care Time devoted to patient care services described in this note is 35 minutes.   Lonia Farber, MD Pulmonary and Critical Care Medicine [facility] Pager: [phone]  03/30/2013, 12:52 PM

## 2013-03-30 NOTE — Progress Notes (Signed)
Inpatient Diabetes Program Recommendations  AACE/ADA: New Consensus Statement on Inpatient Glycemic Control (2013)  Target Ranges:  Prepandial:   less than 140 mg/dL      Peak postprandial:   less than 180 mg/dL (1-2 hours)      Critically ill patients:  140 - 180 mg/dL   Reason for Visit: Results for Celesta GentileMONROE, Leaner L (MRN [id number]) as of 03/30/2013 09:52  Ref. Range 03/29/2013 19:47 03/30/2013 00:12 03/30/2013 04:02 03/30/2013 08:23  Glucose-Capillary Latest Range: 70-99 mg/dL 409132 (H) 811135 (H) 914194 (H) 200 (H)   Diabetes history: Type 2 diabetes  Current orders for Inpatient glycemic control: Lantus 10 units bid, Novolog resistant q 4 hours  Please consider adding Novolog tube feed coverage 3 units q 4 hours.   Thanks, Beryl MeagerJenny Labrea Eccleston, RN, BC-ADM Inpatient Diabetes Coordinator Pager [phone]

## 2013-03-30 NOTE — Progress Notes (Signed)
Patient was coughing. RT placed back on vent temporarily to suction through the inline suction. Very minimal tan thick secretions obtained. Patient placed back on T-bar at 40%. Patient claims that she feels better post suctioning. RT will continue to monitor.  Ancil Boozerarrie Salem Lembke, RRT, RCP

## 2013-03-30 NOTE — Progress Notes (Signed)
Subjective:  Events noted.   CRRT running very well, nearly 5 liters negative again- 10 liters negative since here- unfortunately really no UOP   Objective Vital signs in last 24 hours: Filed Vitals:   03/30/13 0418 03/30/13 0500 03/30/13 0600 03/30/13 0700  BP:  132/61 138/64 139/59  Pulse:  51 51 51  Temp: 96.4 F (35.8 C)     TempSrc: Axillary     Resp:  31 29 28   Height:      Weight:  120 kg (264 lb 8.8 oz)    SpO2:  99% 99% 98%   Weight change: -3.4 kg (-7 lb 7.9 oz)  Intake/Output Summary (Last 24 hours) at 03/30/13 0817 Last data filed at 03/30/13 0800  Gross per 24 hour  Intake 1680.27 ml  Output   6427 ml  Net -4746.73 ml    Assessment/ Plan: Pt is a 43 y.o. yo female who was admitted on 03/23/2013 with  DKA/buttock abscess and  A on CRF  Assessment/Plan: 1. Renal- A on CRF- has worsened and is now oliguric/anuric-  requiring renal replacement with CRRT mostly for volume. She is stable enough to change over to IHD   I am hopeful that she will eventually recover her renal function- baseline creatinine in the low 2's.  I am going to stop CRRT today, challenge with high dose lasix but likely she will need IHD treatment tomorrow. Neck VC now in place for 3 days.  2. HTN/volume- overloaded, BP good- have stopped her antihypertensives so we can be more successful with volume removal, no hypotension or tachycardia yet 3. Anemia- significant- probably not helping her SOB/hypoxia- s/p one unit PRBC -is on ESA as well 4. Acidosis- improved 5. Elytes- on all 4 K bath-  6. Pulm- VDRF- per CCM- also  trying volume removal, now on no abx 7. Buttock abscess- antibiotics stopped-    Jaelan Rasheed A    Labs: Basic Metabolic Panel:  Recent Labs Lab 03/28/13 1600 03/29/13 0500 03/29/13 1615 03/30/13 0500  NA 139 139 137 136*  K 4.0 3.9 3.8 3.8  CL 99 99 98 96  CO2 20 22 23 23   GLUCOSE 160* 137* 135* 205*  BUN 44* 35* 31* 31*  CREATININE 2.10* 2.05* 2.06* 2.01*   CALCIUM 8.7 9.2 9.0 9.5  PHOS 3.5 2.9 3.1  --    Liver Function Tests:  Recent Labs Lab 03/28/13 1600 03/29/13 0500 03/29/13 1615  ALBUMIN 2.3* 2.4* 2.5*   No results found for this basename: LIPASE, AMYLASE,  in the last 168 hours No results found for this basename: AMMONIA,  in the last 168 hours CBC:  Recent Labs Lab 03/23/13 1227  03/25/13 1005 03/26/13 0635 03/27/13 0330 03/28/13 0400 03/29/13 0500 03/30/13 0500  WBC 21.8*  < > 18.4* 15.9* 14.2* 10.8* 14.1* 16.4*  NEUTROABS 19.3*  --  16.8*  --  12.7*  --   --   --   HGB 8.5*  < > 7.4* 6.6* 7.0* 7.3* 7.7* 8.7*  HCT 25.3*  < > 21.8* 19.6* 20.6* 21.5* 23.5* 26.5*  MCV 87.2  < > 86.5 86.0 85.5 84.0 86.1 88.3  PLT 241  < > 211 224 238 276 324 353  < > = values in this interval not displayed. Cardiac Enzymes:  Recent Labs Lab 03/24/13 0700 03/24/13 1200 03/24/13 1826 03/27/13 0330 03/27/13 0825  TROPONINI <0.30 <0.30 <0.30 <0.30 0.31*   CBG:  Recent Labs Lab 03/29/13 1143 03/29/13 1544 03/29/13 1947 03/30/13 0012 03/30/13  0402  GLUCAP 122* 128* 132* 135* 194*    Iron Studies: No results found for this basename: IRON, TIBC, TRANSFERRIN, FERRITIN,  in the last 72 hours Studies/Results: Dg Chest Port 1 View  03/30/2013   CLINICAL DATA:  43 year old female intubated. Heart failure. Sepsis. Initial encounter.  EXAM: PORTABLE CHEST - 1 VIEW  COMPARISON:  03/29/2013 and earlier.  FINDINGS: Portable AP semi upright view at 0434 hrs. Stable endotracheal tube tip at the level the clavicles. Stable enteric tube, tip not included. Stable right IJ central line.  Decreased pulmonary interstitial opacity and improved definition of the pulmonary vasculature. No pneumothorax. No large effusion. Mildly improved retrocardiac ventilation with residual opacity.  IMPRESSION: 1.  Stable lines and tubes. 2. Regressed pulmonary edema. Improved basilar ventilation with residual retrocardiac atelectasis or consolidation.    Electronically Signed   By: Augusto GambleLee  Hall M.D.   On: 03/30/2013 07:52   Dg Chest Port 1 View  03/29/2013   CLINICAL DATA:  Pneumonia and respiratory failure.  EXAM: PORTABLE CHEST - 1 VIEW  COMPARISON:  DG CHEST 1V PORT dated 03/28/2013; DG CHEST 1V PORT dated 03/28/2013; DG CHEST 1V PORT dated 03/27/2013  FINDINGS: Endotracheal tube remains present with the tip approximately 3 cm above the carina. Temporary dialysis catheter shows stable positioning. Nasogastric tube extends below the diaphragm. Lungs show some improvement in aeration bilaterally with residual edema/ airspace disease present. No significant pleural fluid is identified. The heart size is stable.  IMPRESSION: Improved aeration of both lungs.   Electronically Signed   By: Irish LackGlenn  Yamagata M.D.   On: 03/29/2013 08:09   Dg Chest Port 1 View  03/28/2013   CLINICAL DATA:  Hypoxia  EXAM: PORTABLE CHEST - 1 VIEW  COMPARISON:  Study obtained earlier in the day  FINDINGS: Endotracheal tube tip is 5.1 cm above the carina. Nasogastric tube tip and side port are in the stomach. Central catheter tip is in the superior vena cava. No pneumothorax.  There is increased and alveolar edema bilaterally compared to earlier in the day. Heart is mildly prominent with normal pulmonary vascularity.  IMPRESSION: Increase in alveolar edema compared earlier in the day. Question degree of ARDS. Aspiration could present similarly and must be a differential consideration.  Tube and catheter positions as described without pneumothorax.   Electronically Signed   By: Bretta BangWilliam  Woodruff M.D.   On: 03/28/2013 14:08   Medications: Infusions: . sodium chloride 250 mL (03/28/13 1100)  . dexmedetomidine 0.9 mcg/kg/hr (03/30/13 19140812)  . fentaNYL infusion INTRAVENOUS 250 mcg/hr (03/30/13 0742)  . heparin 10,000 units/ 20 mL infusion syringe 2,350 Units/hr (03/30/13 0815)  . dialysis replacement fluid (prismasate) 250 mL/hr at 03/29/13 1853  . dialysis replacement fluid (prismasate) 250  mL/hr at 03/29/13 0740  . dialysate (PRISMASATE) 1,500 mL/hr at 03/30/13 0426    Scheduled Medications: . antiseptic oral rinse  15 mL Mouth Rinse QID  . aspirin  81 mg Oral Daily  . atorvastatin  40 mg Oral q1800  . cefTRIAXone (ROCEPHIN)  IV  2 g Intravenous Q24H  . chlorhexidine  15 mL Mouth Rinse BID  . darbepoetin (ARANESP) injection - DIALYSIS  100 mcg Intravenous Q Tue-HD  . feeding supplement (PRO-STAT SUGAR FREE 64)  30 mL Per Tube TID  . feeding supplement (VITAL HIGH PROTEIN)  1,000 mL Per Tube Q24H  . fentaNYL  50 mcg Intravenous Once  . heparin  5,000 Units Subcutaneous 3 times per day  . insulin aspart  0-20 Units Subcutaneous  Q4H  . insulin glargine  10 Units Subcutaneous Q12H  . metoprolol tartrate  12.5 mg Oral BID  . pantoprazole (PROTONIX) IV  40 mg Intravenous Q24H    have reviewed scheduled and prn medications.  Physical Exam: General: alert on vent- right neck VC (placed 3/3) Heart: RRR Lungs: CBS bilat Abdomen: soft, non tender Extremities: pitting edema- although much better    03/30/2013,8:17 AM  LOS: 7 days

## 2013-03-31 ENCOUNTER — Inpatient Hospital Stay (HOSPITAL_COMMUNITY): Payer: 59

## 2013-03-31 LAB — HEPATITIS B SURFACE ANTIBODY,QUALITATIVE: Hep B S Ab: NEGATIVE

## 2013-03-31 LAB — CBC
HCT: 27.5 % — ABNORMAL LOW (ref 36.0–46.0)
HEMOGLOBIN: 9 g/dL — AB (ref 12.0–15.0)
MCH: 28.8 pg (ref 26.0–34.0)
MCHC: 32.7 g/dL (ref 30.0–36.0)
MCV: 88.1 fL (ref 78.0–100.0)
Platelets: 378 10*3/uL (ref 150–400)
RBC: 3.12 MIL/uL — AB (ref 3.87–5.11)
RDW: 14.6 % (ref 11.5–15.5)
WBC: 16.9 10*3/uL — ABNORMAL HIGH (ref 4.0–10.5)

## 2013-03-31 LAB — GLUCOSE, CAPILLARY
GLUCOSE-CAPILLARY: 132 mg/dL — AB (ref 70–99)
Glucose-Capillary: 143 mg/dL — ABNORMAL HIGH (ref 70–99)
Glucose-Capillary: 112 mg/dL — ABNORMAL HIGH (ref 70–99)
Glucose-Capillary: 183 mg/dL — ABNORMAL HIGH (ref 70–99)
Glucose-Capillary: 128 mg/dL — ABNORMAL HIGH (ref 70–99)
Glucose-Capillary: 149 mg/dL — ABNORMAL HIGH (ref 70–99)

## 2013-03-31 LAB — BASIC METABOLIC PANEL
BUN: 51 mg/dL — ABNORMAL HIGH (ref 6–23)
CO2: 23 meq/L (ref 19–32)
Calcium: 9.9 mg/dL (ref 8.4–10.5)
Chloride: 97 mEq/L (ref 96–112)
Creatinine, Ser: 3.3 mg/dL — ABNORMAL HIGH (ref 0.50–1.10)
GFR calc Af Amer: 19 mL/min — ABNORMAL LOW (ref >90)
GFR, EST NON AFRICAN AMERICAN: 16 mL/min — AB (ref >90)
Glucose, Bld: 164 mg/dL — ABNORMAL HIGH (ref 70–99)
POTASSIUM: 3.8 meq/L (ref 3.7–5.3)
SODIUM: 136 meq/L — AB (ref 137–147)

## 2013-03-31 LAB — HEPATITIS B CORE ANTIBODY, TOTAL: HEP B C TOTAL AB: NONREACTIVE

## 2013-03-31 LAB — MAGNESIUM: MAGNESIUM: 3.1 mg/dL — AB (ref 1.5–2.5)

## 2013-03-31 LAB — HEPATITIS B SURFACE ANTIGEN: HEP B S AG: NEGATIVE

## 2013-03-31 MED ORDER — ALTEPLASE 2 MG IJ SOLR
2.0000 mg | Freq: Once | INTRAMUSCULAR | Status: AC | PRN
  Filled 2013-03-31: qty 2

## 2013-03-31 MED ORDER — INSULIN GLARGINE 100 UNIT/ML ~~LOC~~ SOLN
25.0000 [IU] | Freq: Every day | SUBCUTANEOUS | Status: DC
  Administered 2013-04-01 – 2013-04-04 (×4): 25 [IU] via SUBCUTANEOUS
  Filled 2013-03-31 (×4): qty 0.25

## 2013-03-31 MED ORDER — FENTANYL BOLUS VIA INFUSION
50.0000 ug | INTRAVENOUS | Status: DC | PRN
  Filled 2013-03-31: qty 50

## 2013-03-31 MED ORDER — SODIUM CHLORIDE 0.9 % IV SOLN: 100.0000 mL | INTRAVENOUS | Status: DC | PRN

## 2013-03-31 MED ORDER — HEPARIN SODIUM (PORCINE) 1000 UNIT/ML DIALYSIS
20.0000 [IU]/kg | INTRAMUSCULAR | Status: DC | PRN
  Administered 2013-03-31: 2400 [IU] via INTRAVENOUS_CENTRAL
  Filled 2013-03-31: qty 3

## 2013-03-31 MED ORDER — LIDOCAINE HCL (PF) 1 % IJ SOLN: 5.0000 mL | INTRAMUSCULAR | Status: DC | PRN

## 2013-03-31 MED ORDER — PENTAFLUOROPROP-TETRAFLUOROETH EX AERO: 1.0000 "application " | INHALATION_SPRAY | CUTANEOUS | Status: DC | PRN

## 2013-03-31 MED ORDER — LIDOCAINE-PRILOCAINE 2.5-2.5 % EX CREA
1.0000 "application " | TOPICAL_CREAM | CUTANEOUS | Status: DC | PRN
  Filled 2013-03-31: qty 5

## 2013-03-31 MED ORDER — HEPARIN SODIUM (PORCINE) 1000 UNIT/ML DIALYSIS: 1000.0000 [IU] | INTRAMUSCULAR | Status: DC | PRN

## 2013-03-31 MED ORDER — NEPRO/CARBSTEADY PO LIQD
237.0000 mL | ORAL | Status: DC | PRN
  Filled 2013-03-31: qty 237

## 2013-03-31 MED ORDER — METOCLOPRAMIDE HCL 5 MG/ML IJ SOLN
10.0000 mg | Freq: Three times a day (TID) | INTRAMUSCULAR | Status: DC
  Administered 2013-03-31 (×2): 10 mg via INTRAVENOUS
  Filled 2013-03-31 (×5): qty 2

## 2013-03-31 MED ORDER — MIDAZOLAM HCL 2 MG/2ML IJ SOLN: 1.0000 mg | INTRAMUSCULAR | Status: DC | PRN

## 2013-03-31 NOTE — Progress Notes (Signed)
Subjective:  No significant issues overnight.  Unfortunately minimal UOP  Objective Vital signs in last 24 hours: Filed Vitals:   03/31/13 0400 03/31/13 0403 03/31/13 0500 03/31/13 0600  BP: 150/65  147/117 145/67  Pulse: 55  54 53  Temp:  98 F (36.7 C)    TempSrc:  Oral    Resp:      Height:      Weight:   117.2 kg (258 lb 6.1 oz)   SpO2: 100%  100% 100%   Weight change: -2.8 kg (-6 lb 2.8 oz)  Intake/Output Summary (Last 24 hours) at 03/31/13 0654 Last data filed at 03/31/13 0600  Gross per 24 hour  Intake 1550.99 ml  Output   1579 ml  Net -28.01 ml    Assessment/ Plan: Pt is a 43 y.o. yo female who was admitted on 03/23/2013 with  DKA/buttock abscess and  A on CRF  Assessment/Plan: 1. Renal- A on CRF- has worsened and is now oliguric/anuric-  requiring renal replacement with CRRT mostly for volume 3/4--3/6. She is now stable enough to change over to IHD which will be done today.   I am hopeful that she will eventually recover her renal function- baseline creatinine in the low 2's. Neck VC now in place for 4 days.  2. HTN/volume- overloaded, BP good- have decreased her antihypertensives - only on very low dose lopressor so we can be more successful with volume removal, no hypotension or tachycardia yet 3. Anemia- was significant- probably not helping her SOB/hypoxia- s/p one unit PRBC -is on ESA as well 4. Acidosis- improved 5. Elytes- stable, will run on 4 K bath 6. Pulm- VDRF- per CCM- also  trying volume removal, now on no abx- rocephin 7. Buttock abscess- rocephin   Tamey Wanek A    Labs: Basic Metabolic Panel:  Recent Labs Lab 03/29/13 0500 03/29/13 1615 03/30/13 0500 03/30/13 1600 03/31/13 0430  NA 139 137 136* 135* 136*  K 3.9 3.8 3.8 3.9 3.8  CL 99 98 96 96 97  CO2 22 23 23 26 23   GLUCOSE 137* 135* 205* 179* 164*  BUN 35* 31* 31* 37* 51*  CREATININE 2.05* 2.06* 2.01* 2.35* 3.30*  CALCIUM 9.2 9.0 9.5 9.7 9.9  PHOS 2.9 3.1  --  4.6  --     Liver Function Tests:  Recent Labs Lab 03/29/13 0500 03/29/13 1615 03/30/13 1600  ALBUMIN 2.4* 2.5* 2.7*   No results found for this basename: LIPASE, AMYLASE,  in the last 168 hours No results found for this basename: AMMONIA,  in the last 168 hours CBC:  Recent Labs Lab 03/25/13 1005  03/27/13 0330 03/28/13 0400 03/29/13 0500 03/30/13 0500 03/31/13 0430  WBC 18.4*  < > 14.2* 10.8* 14.1* 16.4* 16.9*  NEUTROABS 16.8*  --  12.7*  --   --   --   --   HGB 7.4*  < > 7.0* 7.3* 7.7* 8.7* 9.0*  HCT 21.8*  < > 20.6* 21.5* 23.5* 26.5* 27.5*  MCV 86.5  < > 85.5 84.0 86.1 88.3 88.1  PLT 211  < > 238 276 324 353 378  < > = values in this interval not displayed. Cardiac Enzymes:  Recent Labs Lab 03/24/13 0700 03/24/13 1200 03/24/13 1826 03/27/13 0330 03/27/13 0825  TROPONINI <0.30 <0.30 <0.30 <0.30 0.31*   CBG:  Recent Labs Lab 03/30/13 1145 03/30/13 1544 03/30/13 1848 03/31/13 0029 03/31/13 0402  GLUCAP 192* 165* 180* 132* 143*    Iron Studies: No results found  for this basename: IRON, TIBC, TRANSFERRIN, FERRITIN,  in the last 72 hours Studies/Results: Dg Chest Port 1 View  03/30/2013   CLINICAL DATA:  43 year old female intubated. Heart failure. Sepsis. Initial encounter.  EXAM: PORTABLE CHEST - 1 VIEW  COMPARISON:  03/29/2013 and earlier.  FINDINGS: Portable AP semi upright view at 0434 hrs. Stable endotracheal tube tip at the level the clavicles. Stable enteric tube, tip not included. Stable right IJ central line.  Decreased pulmonary interstitial opacity and improved definition of the pulmonary vasculature. No pneumothorax. No large effusion. Mildly improved retrocardiac ventilation with residual opacity.  IMPRESSION: 1.  Stable lines and tubes. 2. Regressed pulmonary edema. Improved basilar ventilation with residual retrocardiac atelectasis or consolidation.   Electronically Signed   By: Augusto Gamble M.D.   On: 03/30/2013 07:52   Medications: Infusions: . sodium  chloride 250 mL (03/28/13 1100)  . dexmedetomidine 0.9 mcg/kg/hr (03/31/13 0616)  . fentaNYL infusion INTRAVENOUS 50 mcg/hr (03/30/13 2000)    Scheduled Medications: . antiseptic oral rinse  15 mL Mouth Rinse QID  . aspirin  81 mg Oral Daily  . atorvastatin  40 mg Oral q1800  . cefTRIAXone (ROCEPHIN)  IV  2 g Intravenous Q24H  . chlorhexidine  15 mL Mouth Rinse BID  . darbepoetin (ARANESP) injection - DIALYSIS  100 mcg Intravenous Q Tue-HD  . feeding supplement (PRO-STAT SUGAR FREE 64)  30 mL Per Tube TID  . feeding supplement (VITAL HIGH PROTEIN)  1,000 mL Per Tube Q24H  . fentaNYL  50 mcg Intravenous Once  . heparin  5,000 Units Subcutaneous 3 times per day  . insulin aspart  0-20 Units Subcutaneous Q4H  . insulin glargine  25 Units Subcutaneous Q12H  . metoCLOPramide (REGLAN) injection  10 mg Intravenous 3 times per day  . metoprolol tartrate  12.5 mg Oral BID  . pantoprazole sodium  40 mg Per Tube Daily    have reviewed scheduled and prn medications.  Physical Exam: General: alert on vent- right neck VC (placed 3/3) Heart: RRR Lungs: CBS bilat Abdomen: soft, non tender Extremities: pitting edema- although much better    03/31/2013,6:54 AM  LOS: 8 days

## 2013-03-31 NOTE — Progress Notes (Signed)
eLink Physician-Brief Progress Note Patient Name: Jasmine [name] DOB: 03-03-1970 MRN: [id number]  Date of Service  03/31/2013   HPI/Events of Note  High TF residuals   eICU Interventions  Plan: Start Reglan 10 mg IV q8H      Thatiana Renbarger 03/31/2013, 12:37 AM

## 2013-03-31 NOTE — Progress Notes (Signed)
PULMONARY / CRITICAL CARE MEDICINE  Name: Jasmine [name] MRN: [id number] DOB: Nov 14, 1970    ADMISSION DATE:  03/23/2013 CONSULTATION DATE:  03/31/2013  REFERRING MD :  [facility] PRIMARY SERVICE: PCCM  CHIEF COMPLAINT:  Dsypnea  BRIEF PATIENT DESCRIPTION: 43 yo with poorly controlled insulin-dependent diabetes mellitus admitted 2/27 for L buttock abscess, sepsis and DKA. Intubated for hypoxemic respiratory failure.  SIGNIFICANT EVENTS / STUDIES:  2/27  Incision and drainage of left buttock abscess 3/1    CT abdomen >>> ASD BLL, small BL effusions, IUD 3/3    Intubated 3/4    Transferred to [facility] , CVVHD started 3/4     Failed extubation >>> anxiety, tachycardia, pulmonary edema >>> reintubated  LINES / TUBES: ETT 3/3 >>> 3/4; 3/4 >>> R IJ HD cath 3/3 >>>  CULTURES: 2/27  MRCA PCR >>> neg 2/27  Blood >>> neg 2/27  Wound >>>  PROTEUS  ANTIBIOTICS: Vancomycin  2/27 >>> 3/4 Zosyn  2/27 >>> 3/5 Levaquin 3/1 >>> 3/4 Ceftriaxone 3/5 >>>  INTERVAL HISTORY:   No distress on PS of 5 cm H2O. RASS 0. + F/C. Planned HD today  VITAL SIGNS: Temp:  [97.6 F (36.4 C)-99 F (37.2 C)] 97.9 F (36.6 C) (03/07 1200) Pulse Rate:  [53-59] 54 (03/07 1415) Resp:  [17-28] 20 (03/07 1415) BP: (85-156)/(50-117) 85/50 mmHg (03/07 1415) SpO2:  [96 %-100 %] 100 % (03/07 1415) FiO2 (%):  [40 %] 40 % (03/07 1400) Weight:  [117.2 kg (258 lb 6.1 oz)] 117.2 kg (258 lb 6.1 oz) (03/07 0500)  HEMODYNAMICS:   VENTILATOR SETTINGS: Vent Mode:  [-] PSV FiO2 (%):  [40 %] 40 % Set Rate:  [28 bmp] 28 bmp Vt Set:  [430 mL] 430 mL PEEP:  [5 cmH20] 5 cmH20 Pressure Support:  [5 cmH20-10 cmH20] 10 cmH20 Plateau Pressure:  [22 cmH20-23 cmH20] 23 cmH20  INTAKE / OUTPUT: Intake/Output     03/06 0701 - 03/07 0700 03/07 0701 - 03/08 0700   I.V. (mL/kg) 890.3 (7.6) 201.8 (1.7)   NG/GT 499.4 84   IV Piggyback 116    Total Intake(mL/kg) 1505.7 (12.8) 285.8 (2.4)   Urine (mL/kg/hr) 82 (0) 100 (0.1)   Other 1217 (0.4)     Total Output 1299 100   Net +206.7 +185.8          PHYSICAL EXAMINATION: Gen: Resting comfortable ENT: ETT / OGT Neck: No JVD Lungs: CTAB Cardiovascular: Regular, no murmurs Abdomen: Obese, soft Musculoskeletal: Moves all extremities Neuro:  Sleepy but arouses to voice Skin:  Warm, no lesions / rash  LABS: I have reviewed all of today's lab results. Relevant abnormalities are discussed in the A/P section CXR: CM, edema pattern  ASSESSMENT / PLAN:  PULMONARY A: Acute respiratory failure  Acute pulmonary edema P:   Cont vent support today Anticipate ext 3/08  CARDIOVASCULAR A: Acute on chronic diastolic heart failure Tachycardia, resolved P:  Lipitor, ASA Metoprolol 12.5 bid  RENAL A:   Acute on chronic renal failure Hypervolemia P:   Renal Following HD today  GASTROINTESTINAL A:   GI Px Nutrition P:   TF Protonix  HEMATOLOGIC A:   Anemia P:  Trend CBC Transfuse for Hgb < 7.0 Aranesp  INFECTIOUS A:   Left buttock abscess with PROTEUS P:   Surgery following Simplify abx to Ceftriaxone ( total of 10 days )  ENDOCRINE A:    DM Hyperglycemia DKA resolved P:   SSI Cont Lantus  NEUROLOGIC A:   Severe anxiety,  controlled P:   Precedex gtt Fentanyl gtt Ativan PRN  I have personally obtained history, examined patient, evaluated and interpreted laboratory and imaging results, reviewed medical records, formulated assessment / plan and placed orders.  CRITICAL CARE:  The patient is critically ill with multiple organ systems failure and requires high complexity decision making for assessment and support, frequent evaluation and titration of therapies, application of advanced monitoring technologies and extensive interpretation of multiple databases. Critical Care Time devoted to patient care services described in this note is 35 minutes.   Billy Fischeravid Simonds, MD Pulmonary and Critical Care Medicine [facility] Pager: [phone]  [phone]  03/31/2013, 2:26 PM

## 2013-04-01 ENCOUNTER — Inpatient Hospital Stay (HOSPITAL_COMMUNITY): Payer: 59

## 2013-04-01 LAB — BASIC METABOLIC PANEL
BUN: 43 mg/dL — AB (ref 6–23)
CALCIUM: 9.4 mg/dL (ref 8.4–10.5)
CHLORIDE: 94 meq/L — AB (ref 96–112)
CO2: 24 meq/L (ref 19–32)
Creatinine, Ser: 3.41 mg/dL — ABNORMAL HIGH (ref 0.50–1.10)
GFR calc Af Amer: 18 mL/min — ABNORMAL LOW (ref >90)
GFR calc non Af Amer: 16 mL/min — ABNORMAL LOW (ref >90)
Glucose, Bld: 204 mg/dL — ABNORMAL HIGH (ref 70–99)
Potassium: 4.1 mEq/L (ref 3.7–5.3)
Sodium: 136 mEq/L — ABNORMAL LOW (ref 137–147)

## 2013-04-01 LAB — GLUCOSE, CAPILLARY
GLUCOSE-CAPILLARY: 189 mg/dL — AB (ref 70–99)
GLUCOSE-CAPILLARY: 142 mg/dL — AB (ref 70–99)
Glucose-Capillary: 137 mg/dL — ABNORMAL HIGH (ref 70–99)
Glucose-Capillary: 159 mg/dL — ABNORMAL HIGH (ref 70–99)
Glucose-Capillary: 168 mg/dL — ABNORMAL HIGH (ref 70–99)
Glucose-Capillary: 184 mg/dL — ABNORMAL HIGH (ref 70–99)

## 2013-04-01 LAB — CBC
HEMATOCRIT: 29.1 % — AB (ref 36.0–46.0)
Hemoglobin: 9.5 g/dL — ABNORMAL LOW (ref 12.0–15.0)
MCH: 29.2 pg (ref 26.0–34.0)
MCHC: 32.6 g/dL (ref 30.0–36.0)
MCV: 89.5 fL (ref 78.0–100.0)
Platelets: 363 10*3/uL (ref 150–400)
RBC: 3.25 MIL/uL — AB (ref 3.87–5.11)
RDW: 14.5 % (ref 11.5–15.5)
WBC: 17 10*3/uL — AB (ref 4.0–10.5)

## 2013-04-01 MED ORDER — METOPROLOL TARTRATE 1 MG/ML IV SOLN
2.5000 mg | INTRAVENOUS | Status: DC | PRN
  Administered 2013-04-04: 2.5 mg via INTRAVENOUS
  Filled 2013-04-01: qty 5

## 2013-04-01 MED ORDER — FENTANYL CITRATE 0.05 MG/ML IJ SOLN
12.5000 ug | INTRAMUSCULAR | Status: DC | PRN
  Administered 2013-04-01 – 2013-04-02 (×2): 25 ug via INTRAVENOUS
  Administered 2013-04-02: 12.5 ug via INTRAVENOUS
  Filled 2013-04-01 (×3): qty 2

## 2013-04-01 MED ORDER — ENOXAPARIN SODIUM 30 MG/0.3ML ~~LOC~~ SOLN
30.0000 mg | SUBCUTANEOUS | Status: DC
  Administered 2013-04-01 – 2013-04-04 (×4): 30 mg via SUBCUTANEOUS
  Filled 2013-04-01 (×5): qty 0.3

## 2013-04-01 NOTE — Progress Notes (Signed)
Subjective:  Did HD yesterday- removed 2400 did have transient BP drop with that.  UOP remains poor- still on the vent  Objective Vital signs in last 24 hours: Filed Vitals:   04/01/13 0435 04/01/13 0500 04/01/13 0600 04/01/13 0725  BP:  140/68 140/66   Pulse:  59 59   Temp: 98.4 F (36.9 C)   98.3 F (36.8 C)  TempSrc: Oral   Oral  Resp:  17 18   Height:      Weight:  117.2 kg (258 lb 6.1 oz)    SpO2:  100% 100%    Weight change: -2.1 kg (-4 lb 10.1 oz)  Intake/Output Summary (Last 24 hours) at 04/01/13 16100808 Last data filed at 04/01/13 0600  Gross per 24 hour  Intake  922.6 ml  Output   2530 ml  Net -1607.4 ml    Assessment/ Plan: Pt is a 43 y.o. yo female who was admitted on 03/23/2013 with  DKA/buttock abscess and  A on CRF  Assessment/Plan: 1. Renal- A on CRF- has worsened and is now oliguric/anuric-  requiring renal replacement with CRRT mostly for volume 3/4--3/6. Converted to IHD, done yesterday with mild BP drop.  Unfortunately really no UOP, failed challenge with lasix.  No indications for dialysis today- will likely need tomorrow but will be assessed first.  I am still hopeful that she will eventually recover some renal function- baseline creatinine in the low 2's. Neck VC now in place for 5 days.  2. HTN/volume- overloaded, but much better than previously-  BP good- have stopped her antihypertensives so we can be more successful with volume removal, finally had hypotension  3. Anemia- was significant- better s/p one unit PRBC -is on ESA as well 4. Acidosis- improved 5. Elytes- stable, will run on 4 K bath 6. Pulm- VDRF- per CCM-  trying volume removal, on rocephin- anxiety limiting weaning 7. Buttock abscess- rocephin   Isabel Freese A    Labs: Basic Metabolic Panel:  Recent Labs Lab 03/29/13 0500 03/29/13 1615  03/30/13 1600 03/31/13 0430 04/01/13 0500  NA 139 137  < > 135* 136* 136*  K 3.9 3.8  < > 3.9 3.8 4.1  CL 99 98  < > 96 97 94*  CO2 22  23  < > 26 23 24   GLUCOSE 137* 135*  < > 179* 164* 204*  BUN 35* 31*  < > 37* 51* 43*  CREATININE 2.05* 2.06*  < > 2.35* 3.30* 3.41*  CALCIUM 9.2 9.0  < > 9.7 9.9 9.4  PHOS 2.9 3.1  --  4.6  --   --   < > = values in this interval not displayed. Liver Function Tests:  Recent Labs Lab 03/29/13 0500 03/29/13 1615 03/30/13 1600  ALBUMIN 2.4* 2.5* 2.7*   No results found for this basename: LIPASE, AMYLASE,  in the last 168 hours No results found for this basename: AMMONIA,  in the last 168 hours CBC:  Recent Labs Lab 03/25/13 1005  03/27/13 0330 03/28/13 0400 03/29/13 0500 03/30/13 0500 03/31/13 0430 04/01/13 0500  WBC 18.4*  < > 14.2* 10.8* 14.1* 16.4* 16.9* 17.0*  NEUTROABS 16.8*  --  12.7*  --   --   --   --   --   HGB 7.4*  < > 7.0* 7.3* 7.7* 8.7* 9.0* 9.5*  HCT 21.8*  < > 20.6* 21.5* 23.5* 26.5* 27.5* 29.1*  MCV 86.5  < > 85.5 84.0 86.1 88.3 88.1 89.5  PLT 211  < >  238 276 324 353 378 363  < > = values in this interval not displayed. Cardiac Enzymes:  Recent Labs Lab 03/27/13 0330 03/27/13 0825  TROPONINI <0.30 0.31*   CBG:  Recent Labs Lab 03/31/13 1518 03/31/13 1943 04/01/13 0005 04/01/13 0305 04/01/13 0717  GLUCAP 128* 149* 159* 184* 189*    Iron Studies: No results found for this basename: IRON, TIBC, TRANSFERRIN, FERRITIN,  in the last 72 hours Studies/Results: Dg Chest Port 1 View  03/31/2013   CLINICAL DATA:  Status post intubation  EXAM: PORTABLE CHEST - 1 VIEW  COMPARISON:  03/30/2013  FINDINGS: There is a a right IJ catheter with tip in the projection of the SVC. ET tube tip is above the carina. There is a nasogastric tube within the stomach. Normal heart size. Bilateral pleural effusions and interstitial edema appear increased from previous exam.  IMPRESSION: 1. Worsening CHF pattern.   Electronically Signed   By: Signa Kell M.D.   On: 03/31/2013 09:17   Medications: Infusions: . sodium chloride 250 mL (03/28/13 1100)  . dexmedetomidine  0.5 mcg/kg/hr (04/01/13 0600)  . fentaNYL infusion INTRAVENOUS 50 mcg/hr (03/30/13 2000)    Scheduled Medications: . antiseptic oral rinse  15 mL Mouth Rinse QID  . aspirin  81 mg Oral Daily  . atorvastatin  40 mg Oral q1800  . cefTRIAXone (ROCEPHIN)  IV  2 g Intravenous Q24H  . chlorhexidine  15 mL Mouth Rinse BID  . darbepoetin (ARANESP) injection - DIALYSIS  100 mcg Intravenous Q Tue-HD  . feeding supplement (PRO-STAT SUGAR FREE 64)  30 mL Per Tube TID  . feeding supplement (VITAL HIGH PROTEIN)  1,000 mL Per Tube Q24H  . heparin  5,000 Units Subcutaneous 3 times per day  . insulin aspart  0-20 Units Subcutaneous Q4H  . insulin glargine  25 Units Subcutaneous Daily  . pantoprazole sodium  40 mg Per Tube Daily    have reviewed scheduled and prn medications.  Physical Exam: General: alert on vent- right neck VC (placed 3/3) Heart: RRR Lungs: CBS bilat Abdomen: soft, non tender Extremities: pitting edema- although much better    04/01/2013,8:08 AM  LOS: 9 days

## 2013-04-01 NOTE — Progress Notes (Signed)
Approximately 40 mL of Fentanyl drip wasted 2nd RN verified by Elisha HeadlandBrian Mack RN and Corliss SkainsJuan Mamoudou Mulvehill RN.  Corliss SkainsJuan Dorthula Bier RN

## 2013-04-01 NOTE — Progress Notes (Addendum)
PULMONARY / CRITICAL CARE MEDICINE  Name: Jasmine [name] MRN: [id number] DOB: 1970-09-03    ADMISSION DATE:  03/23/2013 CONSULTATION DATE:  04/01/2013  [facility] [place] [facility] SERVICE: PCCM  CHIEF COMPLAINT:  Dsypnea  BRIEF PATIENT DESCRIPTION: 43 yo with poorly controlled insulin-dependent diabetes mellitus admitted 2/27 for L buttock abscess, sepsis and DKA. Intubated for hypoxemic respiratory failure.  SIGNIFICANT EVENTS / STUDIES:  2/27  Incision and drainage of left buttock abscess 3/1    CT abdomen >>> ASD BLL, small BL effusions, IUD 3/3    Intubated 3/4    [facility] started 3/4     Failed extubation >>> anxiety, tachycardia, pulmonary edema >>> reintubated  LINES / TUBES: ETT 3/3 >>> 3/4; 3/4 >> 3/08 R IJ HD cath 3/3 >>>  CULTURES: 2/27  MRCA PCR >>> neg 2/27  Blood >>> neg 2/27  Wound >>>  PROTEUS  ANTIBIOTICS: Vancomycin  2/27 >>> 3/4 Zosyn  2/27 >>> 3/5 Levaquin 3/1 >>> 3/4 Ceftriaxone 3/5 >>>  INTERVAL HISTORY:   RASS 0. + F/C. Has passed SBT on 5/5  VITAL SIGNS: Temp:  [97.9 F (36.6 C)-98.4 F (36.9 C)] 98.3 F (36.8 C) (03/08 0725) Pulse Rate:  [54-61] 61 (03/08 0812) Resp:  [16-22] 18 (03/08 0812) BP: (81-156)/(46-74) 137/66 mmHg (03/08 0812) SpO2:  [99 %-100 %] 100 % (03/08 0812) FiO2 (%):  [30 %-40 %] 40 % (03/08 0812) Weight:  [115.1 kg (253 lb 12 oz)-117.2 kg (258 lb 6.1 oz)] 117.2 kg (258 lb 6.1 oz) (03/08 0500)  HEMODYNAMICS:   VENTILATOR SETTINGS: Vent Mode:  [-] PSV FiO2 (%):  [30 %-40 %] 40 % Set Rate:  [16 bmp] 16 bmp Vt Set:  [430 mL] 430 mL PEEP:  [5 cmH20] 5 cmH20 Pressure Support:  [5 cmH20-12 cmH20] 5 cmH20 Plateau Pressure:  [17 cmH20] 17 cmH20  INTAKE / OUTPUT: Intake/Output     03/07 0701 - 03/08 0700 03/08 0701 - 03/09 0700   I.V. (mL/kg) 646 (5.5) 20.4 (0.2)   NG/GT 276 0   IV Piggyback 50    Total Intake(mL/kg) 972 (8.3) 20.4 (0.2)   Urine (mL/kg/hr) 150 (0.1) 10 (0)   Other 2400 (0.9)    Total Output 2550 10   Net -1578 +10.4          PHYSICAL EXAMINATION: Gen: Resting comfortable ENT: WNL Neck: No JVD Lungs: Clear Cardiovascular: Regular, no murmurs Abdomen: Obese, soft Musculoskeletal: Moves all extremities Neuro:  Sleepy but arouses to voice Skin:  Warm, no lesions / rash  LABS: I have reviewed all of today's lab results. Relevant abnormalities are discussed in the A/P section  CXR: CM, improved edema pattern  ASSESSMENT / PLAN:  PULMONARY A: Acute respiratory failure  Acute pulmonary edema P:   Extubate to [place] O2 Monitor in ICU post extubation  CARDIOVASCULAR A: Acute on chronic diastolic heart failure Tachycardia, resolved P:  Cont Lipitor, ASA PRN metoprolol to maintain HR < 110/min  RENAL A:   Acute on chronic renal failure Hypervolemia P:   HD per Renal Monitor BMET intermittently Monitor I/Os Correct electrolytes as indicated  GASTROINTESTINAL A:   No issues P:   SUP: N/I post extubation NPO post extubation Consider diet if tolerating extubation  HEMATOLOGIC A:   Anemia P:  Trend CBC Transfuse for Hgb < 7.0 Cont Aranesp  INFECTIOUS A:   Left buttock abscess with PROTEUS P:   Surgery following Complete 10-14 days abx  ENDOCRINE A:  DM Hyperglycemia DKA resolved P:   Cont SSI - change to ACHS when diet started Cont Lantus  NEUROLOGIC A:   Severe anxiety, controlled ICU associated discomfort P:   DC fentanyl gtt Wean dex to off Low dose PRN fentanyl   I have personally obtained history, examined patient, evaluated and interpreted laboratory and imaging results, reviewed medical records, formulated assessment / plan and placed orders.  CRITICAL CARE:  The patient is critically ill with multiple organ systems failure and requires high complexity decision making for assessment and support, frequent evaluation and titration of therapies, application of advanced monitoring technologies and extensive  interpretation of multiple databases. Critical Care Time devoted to patient care services described in this note is 35 minutes.   Billy Fischer, MD Pulmonary and Critical Care Medicine [facility] Pager: [phone]  04/01/2013, 9:06 AM

## 2013-04-01 NOTE — Procedures (Signed)
Extubation Procedure Note  Patient Details:   Name: Jasmine [name] DOB: 03-08-1970 MRN: [id number]   Airway Documentation:     Evaluation  O2 sats: stable throughout Complications: No apparent complications Patient did tolerate procedure well. Bilateral Breath Sounds: Clear;Diminished Suctioning: Oral Yes  Pt tolerated wean, order to extubate, positive for cuff leak. Pt extubated to 4lpm [place]. No dyspnea or stridor noted after extubation. All vitals are within normal limits at this time. RT will continue to monitor.   Armando GangMike, Jacklin Zwick C 04/01/2013, 9:23 AM

## 2013-04-02 DIAGNOSIS — F3289 Other specified depressive episodes: Secondary | ICD-10-CM

## 2013-04-02 DIAGNOSIS — F329 Major depressive disorder, single episode, unspecified: Secondary | ICD-10-CM

## 2013-04-02 LAB — RENAL FUNCTION PANEL
Albumin: 2.8 g/dL — ABNORMAL LOW (ref 3.5–5.2)
BUN: 64 mg/dL — ABNORMAL HIGH (ref 6–23)
CALCIUM: 9.6 mg/dL (ref 8.4–10.5)
CHLORIDE: 97 meq/L (ref 96–112)
CO2: 22 mEq/L (ref 19–32)
Creatinine, Ser: 4.72 mg/dL — ABNORMAL HIGH (ref 0.50–1.10)
GFR calc Af Amer: 12 mL/min — ABNORMAL LOW (ref >90)
GFR calc non Af Amer: 10 mL/min — ABNORMAL LOW (ref >90)
GLUCOSE: 169 mg/dL — AB (ref 70–99)
Phosphorus: 7.1 mg/dL — ABNORMAL HIGH (ref 2.3–4.6)
Potassium: 3.8 mEq/L (ref 3.7–5.3)
SODIUM: 139 meq/L (ref 137–147)

## 2013-04-02 LAB — GLUCOSE, CAPILLARY
GLUCOSE-CAPILLARY: 139 mg/dL — AB (ref 70–99)
GLUCOSE-CAPILLARY: 153 mg/dL — AB (ref 70–99)
GLUCOSE-CAPILLARY: 174 mg/dL — AB (ref 70–99)
Glucose-Capillary: 171 mg/dL — ABNORMAL HIGH (ref 70–99)
Glucose-Capillary: 164 mg/dL — ABNORMAL HIGH (ref 70–99)
Glucose-Capillary: 147 mg/dL — ABNORMAL HIGH (ref 70–99)

## 2013-04-02 LAB — CBC
HCT: 30.2 % — ABNORMAL LOW (ref 36.0–46.0)
Hemoglobin: 9.8 g/dL — ABNORMAL LOW (ref 12.0–15.0)
MCH: 28.9 pg (ref 26.0–34.0)
MCHC: 32.5 g/dL (ref 30.0–36.0)
MCV: 89.1 fL (ref 78.0–100.0)
Platelets: 436 10*3/uL — ABNORMAL HIGH (ref 150–400)
RBC: 3.39 MIL/uL — AB (ref 3.87–5.11)
RDW: 14.4 % (ref 11.5–15.5)
WBC: 21.3 10*3/uL — AB (ref 4.0–10.5)

## 2013-04-02 MED ORDER — MORPHINE SULFATE 2 MG/ML IJ SOLN
INTRAMUSCULAR | Status: AC
  Administered 2013-04-02: 2 mg via INTRAVENOUS
  Filled 2013-04-02: qty 1

## 2013-04-02 MED ORDER — NEPRO/CARBSTEADY PO LIQD
237.0000 mL | Freq: Two times a day (BID) | ORAL | Status: DC
  Administered 2013-04-04 – 2013-04-09 (×2): 237 mL via ORAL
  Filled 2013-04-02 (×8): qty 237

## 2013-04-02 MED ORDER — LORAZEPAM 2 MG/ML IJ SOLN: 2.0000 mg | Freq: Once | INTRAMUSCULAR | Status: DC

## 2013-04-02 MED ORDER — LORAZEPAM 2 MG/ML IJ SOLN
INTRAMUSCULAR | Status: AC
  Administered 2013-04-02: 2 mg
  Filled 2013-04-02: qty 1

## 2013-04-02 MED ORDER — LORAZEPAM 2 MG/ML IJ SOLN
0.5000 mg | INTRAMUSCULAR | Status: DC | PRN
  Administered 2013-04-03: 1 mg via INTRAVENOUS
  Administered 2013-04-03 – 2013-04-05 (×3): 2 mg via INTRAVENOUS
  Filled 2013-04-02 (×3): qty 1

## 2013-04-02 MED ORDER — DEXMEDETOMIDINE HCL IN NACL 200 MCG/50ML IV SOLN
0.4000 ug/kg/h | INTRAVENOUS | Status: DC
  Administered 2013-04-02 (×2): 0.4 ug/kg/h via INTRAVENOUS
  Administered 2013-04-02: 0.2 ug/kg/h via INTRAVENOUS
  Administered 2013-04-02: 0.4 ug/kg/h via INTRAVENOUS
  Administered 2013-04-03: 0.5 ug/kg/h via INTRAVENOUS
  Filled 2013-04-02 (×6): qty 50

## 2013-04-02 MED ORDER — ZOLPIDEM TARTRATE 5 MG PO TABS
5.0000 mg | ORAL_TABLET | Freq: Every day | ORAL | Status: AC
  Administered 2013-04-02: 5 mg via ORAL
  Filled 2013-04-02: qty 1

## 2013-04-02 MED ORDER — MORPHINE SULFATE 2 MG/ML IJ SOLN
1.0000 mg | INTRAMUSCULAR | Status: DC | PRN
  Administered 2013-04-03 – 2013-04-06 (×3): 2 mg via INTRAVENOUS
  Administered 2013-04-07 – 2013-04-13 (×7): 4 mg via INTRAVENOUS
  Filled 2013-04-02: qty 2
  Filled 2013-04-02: qty 1
  Filled 2013-04-02: qty 2
  Filled 2013-04-02 (×5): qty 1
  Filled 2013-04-02 (×3): qty 2
  Filled 2013-04-02: qty 1

## 2013-04-02 MED ORDER — ONDANSETRON HCL 4 MG/2ML IJ SOLN
4.0000 mg | Freq: Three times a day (TID) | INTRAMUSCULAR | Status: DC | PRN
  Administered 2013-04-02 – 2013-04-05 (×4): 4 mg via INTRAVENOUS
  Filled 2013-04-02 (×4): qty 2

## 2013-04-02 NOTE — Progress Notes (Signed)
PULMONARY / CRITICAL CARE MEDICINE  Name: Jasmine [name] MRN: [id number] DOB: [birth year]    ADMISSION DATE:  03/23/2013 CONSULTATION DATE:  04/02/2013  REFERRING MD :  [facility] PRIMARY SERVICE: PCCM  CHIEF COMPLAINT:  Dsypnea  BRIEF PATIENT DESCRIPTION: 43 yo with poorly controlled insulin-dependent diabetes mellitus admitted 2/27 for L buttock abscess, sepsis and DKA. Intubated for hypoxemic respiratory failure.  LINES / TUBES: ETT 3/3 >>> 3/4; 3/4 >> 3/08 R IJ HD cath 3/3 >>>  CULTURES: 2/27  MRCA PCR >>> neg 2/27  Blood >>> neg 2/27  Wound >>>  PROTEUS  ANTIBIOTICS: Vancomycin  2/27 >>> 3/4 Zosyn  2/27 >>> 3/5 Levaquin 3/1 >>> 3/4 Ceftriaxone 3/5 >>>   SIGNIFICANT EVENTS / STUDIES:  2/27  Incision and drainage of left buttock abscess 3/1    CT abdomen >>> ASD BLL, small BL effusions, IUD 3/3    Intubated 3/4    Transferred to [facility] , CVVHD started 3/4     Failed extubation >>> anxiety, tachycardia, pulmonary edema >>> reintubated 3/8 - extubated  INTERVAL HISTORY:   04/02/13: very anxious with hyperventilation. Needed to breathe in closed bag circuit, ativan and restarted precedex -> only some improvement with this  VITAL SIGNS: Temp:  [98 F (36.7 C)-99.2 F (37.3 C)] 98.1 F (36.7 C) (03/09 1200) Pulse Rate:  [63-120] 90 (03/09 1200) Resp:  [0-50] 17 (03/09 1200) BP: (120-175)/(50-127) 124/77 mmHg (03/09 1200) SpO2:  [93 %-100 %] 96 % (03/09 1200)  HEMODYNAMICS:   VENTILATOR SETTINGS:    INTAKE / OUTPUT: Intake/Output     03/08 0701 - 03/09 0700 03/09 0701 - 03/10 0700   P.O. 25 50   I.V. (mL/kg) 76.2 (0.7) 77.6 (0.7)   NG/GT 0    IV Piggyback 50    Total Intake(mL/kg) 151.2 (1.3) 127.6 (1.1)   Urine (mL/kg/hr) 215 (0.1) 100 (0.1)   Other     Total Output 215 100   Net -63.8 +27.6        Stool Occurrence  1 x     PHYSICAL EXAMINATION: Gen: Very anxious. Husband at bedside trying to comfort ENT: WNL Neck: No JVD Lungs: Clear Cardiovascular: Regular,  no murmurs Abdomen: Obese, soft Musculoskeletal: Moves all extremities Neuro:  Crying, anxiouys. AXOx3. Speech normal. Skin:  Warm, no lesions / rash   PULMONARY  Recent Labs Lab 03/27/13 0216 03/27/13 0351 03/27/13 1213 03/27/13 1423 03/27/13 2209 03/28/13 1505  PHART 7.244* 7.304* 7.345* 7.343*  --  7.378  PCO2ART 34.6* 28.1* 30.2* 30.4*  --  34.6*  PO2ART 83.5 125.0* 65.7* 81.0  --  101.0*  HCO3 14.3* 13.5* 16.0* 16.6* 20.2 20.4  TCO2 14.0 13.3 15.5 17 21 21   O2SAT 94.9 98.6 92.1 96.0 73.0 98.0    CBC  Recent Labs Lab 03/31/13 0430 04/01/13 0500 04/02/13 0500  HGB 9.0* 9.5* 9.8*  HCT 27.5* 29.1* 30.2*  WBC 16.9* 17.0* 21.3*  PLT 378 363 436*    COAGULATION No results found for this basename: INR,  in the last 168 hours  CARDIAC   Recent Labs Lab 03/27/13 0330 03/27/13 0825  TROPONINI <0.30 0.31*   No results found for this basename: PROBNP,  in the last 168 hours   CHEMISTRY  Recent Labs Lab 03/27/13 0330  03/28/13 0400 03/28/13 1600 03/29/13 0500 03/29/13 1615 03/30/13 0500 03/30/13 1600 03/31/13 0430 04/01/13 0500 04/02/13 0500  NA 135*  < > 139 139 139 137 136* 135* 136* 136* 139  K 3.6*  < >  3.4* 4.0 3.9 3.8 3.8 3.9 3.8 4.1 3.8  CL 98  < > 101 99 99 98 96 96 97 94* 97  CO2 14*  < > 21 20 22 23 23 26 23 24 22   GLUCOSE 296*  < > 113* 160* 137* 135* 205* 179* 164* 204* 169*  BUN 83*  < > 59* 44* 35* 31* 31* 37* 51* 43* 64*  CREATININE 3.65*  < > 2.50* 2.10* 2.05* 2.06* 2.01* 2.35* 3.30* 3.41* 4.72*  CALCIUM 8.7  < > 8.6 8.7 9.2 9.0 9.5 9.7 9.9 9.4 9.6  MG 2.1  --  2.3  --  2.7*  --  2.7*  --  3.1*  --   --   PHOS 5.8*  < > 3.5 3.5 2.9 3.1  --  4.6  --   --  7.1*  < > = values in this interval not displayed. Estimated Creatinine Clearance: 20.5 ml/min (by C-G formula based on Cr of 4.72).   LIVER  Recent Labs Lab 03/28/13 1600 03/29/13 0500 03/29/13 1615 03/30/13 1600 04/02/13 0500  ALBUMIN 2.3* 2.4* 2.5* 2.7* 2.8*      INFECTIOUS  Recent Labs Lab 03/27/13 0330 03/27/13 1120  LATICACIDVEN  --  1.1  PROCALCITON 3.61  --      ENDOCRINE CBG (last 3)   Recent Labs  04/02/13 0419 04/02/13 0819 04/02/13 1149  GLUCAP 153* 171* 164*         IMAGING x48h  Dg Chest Port 1 View  04/01/2013   CLINICAL DATA:  Respiratory failure.  EXAM: PORTABLE CHEST - 1 VIEW  COMPARISON:  DG CHEST 1V PORT dated 03/31/2013  FINDINGS: Endotracheal tube is roughly 3.2 cm above the carina. Right central line tip in the upper SVC region. Slightly improved aeration in the lungs suggests decreased edema. However, there may be increased atelectasis or pleural fluid along the right minor fissure. Heart size is normal.  IMPRESSION: Decreased interstitial edema but there is increased fluid or atelectasis along the right minor fissure.  Support apparatuses as described.   Electronically Signed   By: Richarda OverlieAdam  Henn M.D.   On: 04/01/2013 08:16      ASSESSMENT / PLAN:  PULMONARY A: Acute respiratory failure  Acute pulmonary edema    - hyperventilating with anxiety and this is causing stridor despite precedex and ativan x 1 P:   Ativan prn; repeat Morphine prn for dyspnea;    CARDIOVASCULAR A: Acute on chronic diastolic heart failure Tachycardia, resolved P:  Cont Lipitor, ASA PRN metoprolol to maintain HR < 110/min  RENAL A:   Acute on chronic renal failure. Sp CRRT ending 03/30/13. Last HD 03/31/13   - creat worsening.  P:   HD per Renal Monitor BMET intermittently Monitor I/Os Correct electrolytes as indicated  GASTROINTESTINAL A:   No issues P:   SUP: N/I post extubation Start renal dm diet; with nutrition consult  HEMATOLOGIC A:   Anemia P:  Trend CBC Transfuse for Hgb < 7.0 Cont Aranesp  INFECTIOUS A:   Left buttock abscess with PROTEUS. No indication for exploration 04/02/13 P:   Surgery following Complete 10-14 days abx  ENDOCRINE A:    DM Hyperglycemia DKA resolved P:   Cont  SSI - change to ACHS when diet started Cont Lantus  NEUROLOGIC A:   Severe anxiety, uncontrolled. Husband says insomnia is cause P:   Prn ativan Ambien for sleep tonight Psych consult  I have personally obtained history, examined patient, evaluated and interpreted laboratory  and imaging results, reviewed medical records, formulated assessment / plan and placed orders.  CRITICAL CARE:  The patient is critically ill with multiple organ systems failure and requires high complexity decision making for assessment and support, frequent evaluation and titration of therapies, application of advanced monitoring technologies and extensive interpretation of multiple databases. Critical Care Time devoted to patient care services described in this note is 35 minutes.    Dr. Kalman Shan, M.D., [facility].C.P Pulmonary and Critical Care Medicine Staff Physician [facility] [place] Pulmonary and Critical Care Pager: [phone], If no answer or between  15:00h - 7:00h: call [phone]  04/02/2013 1:42 PM

## 2013-04-02 NOTE — Progress Notes (Signed)
Subjective: No new complaints Minimal discomfort from buttock  Objective: Vital signs in last 24 hours: Temp:  [97.8 F (36.6 C)-99.2 F (37.3 C)] 98.7 F (37.1 C) (03/09 0400) Pulse Rate:  [59-91] 91 (03/09 0500) Resp:  [12-27] 18 (03/09 0500) BP: (133-158)/(52-127) 147/52 mmHg (03/09 0500) SpO2:  [100 %] 100 % (03/09 0500) Last BM Date: 04/01/13  Intake/Output from previous day: 03/08 0701 - 03/09 0700 In: 126.2 [I.V.:76.2; IV Piggyback:50] Out: 215 [Urine:215] Intake/Output this shift:    I removed her packing.  Probed wound.  Now purulence found.  Pressed on wound significantly with no drainage.  No induration and minimal erythema  Lab Results:   Recent Labs  04/01/13 0500 04/02/13 0500  WBC 17.0* 21.3*  HGB 9.5* 9.8*  HCT 29.1* 30.2*  PLT 363 436*   BMET  Recent Labs  04/01/13 0500 04/02/13 0500  NA 136* 139  K 4.1 3.8  CL 94* 97  CO2 24 22  GLUCOSE 204* 169*  BUN 43* 64*  CREATININE 3.41* 4.72*  CALCIUM 9.4 9.6   PT/INR No results found for this basename: LABPROT, INR,  in the last 72 hours ABG No results found for this basename: PHART, PCO2, PO2, HCO3,  in the last 72 hours  Studies/Results: Dg Chest Port 1 View  04/01/2013   CLINICAL DATA:  Respiratory failure.  EXAM: PORTABLE CHEST - 1 VIEW  COMPARISON:  DG CHEST 1V PORT dated 03/31/2013  FINDINGS: Endotracheal tube is roughly 3.2 cm above the carina. Right central line tip in the upper SVC region. Slightly improved aeration in the lungs suggests decreased edema. However, there may be increased atelectasis or pleural fluid along the right minor fissure. Heart size is normal.  IMPRESSION: Decreased interstitial edema but there is increased fluid or atelectasis along the right minor fissure.  Support apparatuses as described.   Electronically Signed   By: Richarda Overlie M.D.   On: 04/01/2013 08:16    Anti-infectives: Anti-infectives   Start     Dose/Rate Route Frequency Ordered Stop   03/29/13 1500   cefTRIAXone (ROCEPHIN) 2 g in dextrose 5 % 50 mL IVPB     2 g 100 mL/hr over 30 Minutes Intravenous Every 24 hours 03/29/13 1439     03/28/13 2100  vancomycin (VANCOCIN) 1,250 mg in sodium chloride 0.9 % 250 mL IVPB  Status:  Discontinued     1,250 mg 166.7 mL/hr over 90 Minutes Intravenous Every 24 hours 03/28/13 1120 03/28/13 1125   03/28/13 2000  Levofloxacin (LEVAQUIN) IVPB 250 mg  Status:  Discontinued     250 mg 50 mL/hr over 60 Minutes Intravenous Every 24 hours 03/28/13 1118 03/28/13 1125   03/27/13 2000  vancomycin (VANCOCIN) 1,750 mg in sodium chloride 0.9 % 500 mL IVPB  Status:  Discontinued     1,750 mg 250 mL/hr over 120 Minutes Intravenous Every 48 hours 03/26/13 0931 03/28/13 1120   03/25/13 1800  levofloxacin (LEVAQUIN) IVPB 750 mg  Status:  Discontinued     750 mg 100 mL/hr over 90 Minutes Intravenous Every 48 hours 03/25/13 1625 03/28/13 1116   03/24/13 2000  vancomycin (VANCOCIN) 1,750 mg in sodium chloride 0.9 % 500 mL IVPB  Status:  Discontinued     1,750 mg 250 mL/hr over 120 Minutes Intravenous Every 24 hours 03/23/13 1903 03/26/13 0931   03/23/13 2000  piperacillin-tazobactam (ZOSYN) IVPB 3.375 g  Status:  Discontinued     3.375 g 12.5 mL/hr over 240 Minutes Intravenous 3 times  per day 03/23/13 1903 03/28/13 1123   03/23/13 2000  vancomycin (VANCOCIN) 2,000 mg in sodium chloride 0.9 % 500 mL IVPB     2,000 mg 250 mL/hr over 120 Minutes Intravenous  Once 03/23/13 1903 03/24/13 0005   03/23/13 1345  clindamycin (CLEOCIN) IVPB 600 mg     600 mg 100 mL/hr over 30 Minutes Intravenous  Once 03/23/13 1338 03/23/13 1514      Assessment/Plan: s/p * No surgery found *  Left buttock abscess  WBC up, but I don't think there is a deeper buttock abscess given exam Will continue current wound care  LOS: 10 days    Di Jasmer A 04/02/2013

## 2013-04-02 NOTE — Progress Notes (Signed)
NUTRITION CONSULT / FOLLOW UP  Intervention:    Diet advancement to Renal, CHO-modified.  Nepro Shake PO BID, each supplement provides 425 kcal and 19 grams protein  Nutrition Dx:   Inadequate oral intake related to inability to eat as evidenced by NPO status. Ongoing.  New Goal:   Intake to meet >90% of estimated nutrition needs. Unmet.  Monitor:   PO intake, labs, weight trend.  Assessment:   Patient is a 43 y.o. female with a past medical history of poorly controlled insulin-dependent diabetes mellitus, nonobstructive coronary artery disease, hypertension, stage III chronic kidney disease, presenting to the emergency department with complaints of elevated blood sugars. In the ED, found to have left buttock abscess, S/P I&D.  Discussed patient in ICU rounds today. Patient was extubated on 3/8. Received consult to order appropriate diet for patient with diabetes and ESRD on HD. Patient was receiving TF while intubated, has been off since extubation yesterday. S/P CRRT from 3/4-3/6. Now receiving HD, last HD on 3/7 with no sign of renal recovery per Nephrology note. Plans for HD again tomorrow. Patient with increased protein needs to support wound healing.   Height: Ht Readings from Last 1 Encounters:  03/27/13 5\' 7"  (1.702 m)    Weight Status:   Wt Readings from Last 1 Encounters:  04/01/13 258 lb 6.1 oz (117.2 kg)  03/29/13  272 lb 0.8 oz (123.4 kg)   Body mass index is 40.46 kg/(m^2).   Re-estimated needs:  Kcal: 1900-2100 Protein: 110-120 gm Fluid: 1.2 L  Skin: left buttock abscess S/P I&D  Diet Order: NPO   Intake/Output Summary (Last 24 hours) at 04/02/13 1534 Last data filed at 04/02/13 1500  Gross per 24 hour  Intake  283.3 ml  Output    275 ml  Net    8.3 ml    Last BM: 3/9   Labs:   Recent Labs Lab 03/29/13 0500 03/29/13 1615 03/30/13 0500 03/30/13 1600 03/31/13 0430 04/01/13 0500 04/02/13 0500  NA 139 137 136* 135* 136* 136* 139  K 3.9  3.8 3.8 3.9 3.8 4.1 3.8  CL 99 98 96 96 97 94* 97  CO2 22 23 23 26 23 24 22   BUN 35* 31* 31* 37* 51* 43* 64*  CREATININE 2.05* 2.06* 2.01* 2.35* 3.30* 3.41* 4.72*  CALCIUM 9.2 9.0 9.5 9.7 9.9 9.4 9.6  MG 2.7*  --  2.7*  --  3.1*  --   --   PHOS 2.9 3.1  --  4.6  --   --  7.1*  GLUCOSE 137* 135* 205* 179* 164* 204* 169*    CBG (last 3)   Recent Labs  04/02/13 0419 04/02/13 0819 04/02/13 1149  GLUCAP 153* 171* 164*    Scheduled Meds: . antiseptic oral rinse  15 mL Mouth Rinse QID  . aspirin  81 mg Oral Daily  . atorvastatin  40 mg Oral q1800  . cefTRIAXone (ROCEPHIN)  IV  2 g Intravenous Q24H  . chlorhexidine  15 mL Mouth Rinse BID  . darbepoetin (ARANESP) injection - DIALYSIS  100 mcg Intravenous Q Tue-HD  . enoxaparin (LOVENOX) injection  30 mg Subcutaneous Q24H  . insulin aspart  0-20 Units Subcutaneous Q4H  . insulin glargine  25 Units Subcutaneous Daily  . LORazepam  2 mg Intravenous Once  . LORazepam  2 mg Intravenous Once  . zolpidem  5 mg Oral QHS    Continuous Infusions: . sodium chloride 250 mL (03/28/13 1100)  . dexmedetomidine 0.4  mcg/kg/hr (04/02/13 1250)    Joaquin CourtsKimberly Harris, RD, LDN, CNSC Pager [phone] After Hours Pager [phone]

## 2013-04-02 NOTE — Progress Notes (Signed)
[facility] Progress Note   Subjective: extubated, and agitated per RN, but on precedex now  Filed Vitals:   04/02/13 1000 04/02/13 1100 04/02/13 1200 04/02/13 1242  BP: 156/62  124/77   Pulse: 102 99 90   Temp:   98.1 F (36.7 C)   TempSrc:    Oral  Resp: 23 18 17    Height:      Weight:      SpO2: 100% 100% 96%    Exam: Up in chair, sleeping, arouses briefly, no distress No JVD Chest clear bilat RRR no MRG Abd soft, obese, nt, nd No LE edema  R neck temp HD cath Neuro is nf   Summary: Pt is a 43 y.o. yo female who was admitted on 03/23/2013 with DKA/buttock abscess and Acute on CRF. Had CRRT 3/4 - 3/6.    Assessment: 1 Acute on CRF- s/p CRRT, last HD on 3/7, no sign of recovery yet 2 CKD baseline creat 1.8-2.5 3 DM w multiple complications 4 Resp failure- off vent now, pulm edema +/- pna 5 L buttock abcess s/p I&D, on IV abx 6 Anemia on darbe 100/wk, Hb 9.2 7 HTN was on 3 BP meds + 2 diuretics at home > none here, vol well controlled   Plan- HD tomorrow    Jasmine Moselle MD  pager [phone]    cell [phone]  04/02/2013, 2:17 PM     Recent Labs Lab 03/29/13 1615  03/30/13 1600 03/31/13 0430 04/01/13 0500 04/02/13 0500  NA 137  < > 135* 136* 136* 139  K 3.8  < > 3.9 3.8 4.1 3.8  CL 98  < > 96 97 94* 97  CO2 23  < > 26 23 24 22   GLUCOSE 135*  < > 179* 164* 204* 169*  BUN 31*  < > 37* 51* 43* 64*  CREATININE 2.06*  < > 2.35* 3.30* 3.41* 4.72*  CALCIUM 9.0  < > 9.7 9.9 9.4 9.6  PHOS 3.1  --  4.6  --   --  7.1*  < > = values in this interval not displayed.  Recent Labs Lab 03/29/13 1615 03/30/13 1600 04/02/13 0500  ALBUMIN 2.5* 2.7* 2.8*    Recent Labs Lab 03/27/13 0330  03/31/13 0430 04/01/13 0500 04/02/13 0500  WBC 14.2*  < > 16.9* 17.0* 21.3*  NEUTROABS 12.7*  --   --   --   --   HGB 7.0*  < > 9.0* 9.5* 9.8*  HCT 20.6*  < > 27.5* 29.1* 30.2*  MCV 85.5  < > 88.1 89.5 89.1  PLT 238  < > 378 363 436*  < > = values in this  interval not displayed. Marland Kitchen antiseptic oral rinse  15 mL Mouth Rinse QID  . aspirin  81 mg Oral Daily  . atorvastatin  40 mg Oral q1800  . cefTRIAXone (ROCEPHIN)  IV  2 g Intravenous Q24H  . chlorhexidine  15 mL Mouth Rinse BID  . darbepoetin (ARANESP) injection - DIALYSIS  100 mcg Intravenous Q Tue-HD  . enoxaparin (LOVENOX) injection  30 mg Subcutaneous Q24H  . insulin aspart  0-20 Units Subcutaneous Q4H  . insulin glargine  25 Units Subcutaneous Daily  . LORazepam  2 mg Intravenous Once  . LORazepam  2 mg Intravenous Once  . zolpidem  5 mg Oral QHS   . sodium chloride 250 mL (03/28/13 1100)  . dexmedetomidine 0.4 mcg/kg/hr (04/02/13 1250)   sodium chloride, sodium chloride, acetaminophen, albuterol, fentaNYL, heparin,  heparin, lidocaine (PF), lidocaine-prilocaine, LORazepam, metoprolol, morphine injection, nitroGLYCERIN, ondansetron (ZOFRAN) IV, pentafluoroprop-tetrafluoroeth

## 2013-04-03 DIAGNOSIS — F4323 Adjustment disorder with mixed anxiety and depressed mood: Secondary | ICD-10-CM

## 2013-04-03 DIAGNOSIS — K3184 Gastroparesis: Secondary | ICD-10-CM

## 2013-04-03 LAB — GLUCOSE, CAPILLARY
GLUCOSE-CAPILLARY: 181 mg/dL — AB (ref 70–99)
GLUCOSE-CAPILLARY: 198 mg/dL — AB (ref 70–99)
Glucose-Capillary: 102 mg/dL — ABNORMAL HIGH (ref 70–99)
Glucose-Capillary: 137 mg/dL — ABNORMAL HIGH (ref 70–99)
Glucose-Capillary: 170 mg/dL — ABNORMAL HIGH (ref 70–99)
Glucose-Capillary: 209 mg/dL — ABNORMAL HIGH (ref 70–99)

## 2013-04-03 LAB — RENAL FUNCTION PANEL
Albumin: 2.7 g/dL — ABNORMAL LOW (ref 3.5–5.2)
BUN: 76 mg/dL — ABNORMAL HIGH (ref 6–23)
CHLORIDE: 94 meq/L — AB (ref 96–112)
CO2: 22 mEq/L (ref 19–32)
CREATININE: 4.93 mg/dL — AB (ref 0.50–1.10)
Calcium: 9.2 mg/dL (ref 8.4–10.5)
GFR calc Af Amer: 12 mL/min — ABNORMAL LOW (ref >90)
GFR, EST NON AFRICAN AMERICAN: 10 mL/min — AB (ref >90)
Glucose, Bld: 216 mg/dL — ABNORMAL HIGH (ref 70–99)
Phosphorus: 6.6 mg/dL — ABNORMAL HIGH (ref 2.3–4.6)
Potassium: 3.7 mEq/L (ref 3.7–5.3)
Sodium: 136 mEq/L — ABNORMAL LOW (ref 137–147)

## 2013-04-03 LAB — CBC
HCT: 28 % — ABNORMAL LOW (ref 36.0–46.0)
HEMOGLOBIN: 9.2 g/dL — AB (ref 12.0–15.0)
MCH: 28.5 pg (ref 26.0–34.0)
MCHC: 32.9 g/dL (ref 30.0–36.0)
MCV: 86.7 fL (ref 78.0–100.0)
PLATELETS: 362 10*3/uL (ref 150–400)
RBC: 3.23 MIL/uL — AB (ref 3.87–5.11)
RDW: 13.9 % (ref 11.5–15.5)
WBC: 15.5 10*3/uL — AB (ref 4.0–10.5)

## 2013-04-03 MED ORDER — HEPARIN SODIUM (PORCINE) 1000 UNIT/ML DIALYSIS
2000.0000 [IU] | INTRAMUSCULAR | Status: DC | PRN
  Filled 2013-04-03: qty 2

## 2013-04-03 MED ORDER — SODIUM CHLORIDE 0.9 % IV SOLN: 100.0000 mL | INTRAVENOUS | Status: DC | PRN

## 2013-04-03 MED ORDER — HEPARIN SODIUM (PORCINE) 1000 UNIT/ML DIALYSIS: 1000.0000 [IU] | INTRAMUSCULAR | Status: DC | PRN

## 2013-04-03 MED ORDER — PENTAFLUOROPROP-TETRAFLUOROETH EX AERO: 1.0000 "application " | INHALATION_SPRAY | CUTANEOUS | Status: DC | PRN

## 2013-04-03 MED ORDER — ZOLPIDEM TARTRATE 5 MG PO TABS
5.0000 mg | ORAL_TABLET | Freq: Every evening | ORAL | Status: DC | PRN
  Administered 2013-04-04: 5 mg via ORAL
  Filled 2013-04-03: qty 1

## 2013-04-03 MED ORDER — NEPRO/CARBSTEADY PO LIQD
237.0000 mL | ORAL | Status: DC | PRN
  Administered 2013-04-04: 237 mL via ORAL
  Filled 2013-04-03: qty 237

## 2013-04-03 MED ORDER — DEXTROSE 5 % IV SOLN
2.0000 g | INTRAVENOUS | Status: AC
  Administered 2013-04-03 – 2013-04-05 (×3): 2 g via INTRAVENOUS
  Filled 2013-04-03 (×4): qty 2

## 2013-04-03 MED ORDER — LIDOCAINE HCL (PF) 1 % IJ SOLN: 5.0000 mL | INTRAMUSCULAR | Status: DC | PRN

## 2013-04-03 MED ORDER — INSULIN ASPART 100 UNIT/ML ~~LOC~~ SOLN
0.0000 [IU] | Freq: Three times a day (TID) | SUBCUTANEOUS | Status: DC
Start: 2013-04-03 — End: 2013-04-14
  Administered 2013-04-03: 4 [IU] via SUBCUTANEOUS
  Administered 2013-04-04 (×2): 7 [IU] via SUBCUTANEOUS
  Administered 2013-04-04: 4 [IU] via SUBCUTANEOUS
  Administered 2013-04-04: 7 [IU] via SUBCUTANEOUS
  Administered 2013-04-05: 11 [IU] via SUBCUTANEOUS
  Administered 2013-04-05: 7 [IU] via SUBCUTANEOUS
  Administered 2013-04-05 – 2013-04-06 (×3): 11 [IU] via SUBCUTANEOUS
  Administered 2013-04-06: 7 [IU] via SUBCUTANEOUS
  Administered 2013-04-07: 15 [IU] via SUBCUTANEOUS
  Administered 2013-04-07: 7 [IU] via SUBCUTANEOUS
  Administered 2013-04-07: 4 [IU] via SUBCUTANEOUS
  Administered 2013-04-08: 15 [IU] via SUBCUTANEOUS
  Administered 2013-04-08: 4 [IU] via SUBCUTANEOUS
  Administered 2013-04-08: 7 [IU] via SUBCUTANEOUS
  Administered 2013-04-08: 4 [IU] via SUBCUTANEOUS
  Administered 2013-04-09 (×2): 7 [IU] via SUBCUTANEOUS
  Administered 2013-04-09: 11 [IU] via SUBCUTANEOUS
  Administered 2013-04-09 – 2013-04-10 (×2): 7 [IU] via SUBCUTANEOUS
  Administered 2013-04-10: 4 [IU] via SUBCUTANEOUS
  Administered 2013-04-10: 11 [IU] via SUBCUTANEOUS
  Administered 2013-04-10: 20 [IU] via SUBCUTANEOUS
  Administered 2013-04-11: 4 [IU] via SUBCUTANEOUS
  Administered 2013-04-11: 3 [IU] via SUBCUTANEOUS
  Administered 2013-04-12: 4 [IU] via SUBCUTANEOUS
  Administered 2013-04-13: 3 [IU] via SUBCUTANEOUS

## 2013-04-03 MED ORDER — DARBEPOETIN ALFA-POLYSORBATE 100 MCG/0.5ML IJ SOLN
INTRAMUSCULAR | Status: AC
  Filled 2013-04-03: qty 0.5

## 2013-04-03 MED ORDER — CLONAZEPAM 0.5 MG PO TABS
0.5000 mg | ORAL_TABLET | Freq: Two times a day (BID) | ORAL | Status: DC
  Administered 2013-04-03 – 2013-04-13 (×19): 0.5 mg via ORAL
  Filled 2013-04-03 (×19): qty 1

## 2013-04-03 MED ORDER — LIDOCAINE-PRILOCAINE 2.5-2.5 % EX CREA
1.0000 "application " | TOPICAL_CREAM | CUTANEOUS | Status: DC | PRN
  Filled 2013-04-03: qty 5

## 2013-04-03 MED ORDER — ALTEPLASE 2 MG IJ SOLR
2.0000 mg | Freq: Once | INTRAMUSCULAR | Status: AC | PRN
  Filled 2013-04-03: qty 2

## 2013-04-03 MED ORDER — TRAZODONE HCL 50 MG PO TABS
50.0000 mg | ORAL_TABLET | Freq: Every day | ORAL | Status: DC
  Administered 2013-04-03 – 2013-04-12 (×10): 50 mg via ORAL
  Filled 2013-04-03 (×11): qty 1

## 2013-04-03 NOTE — Procedures (Signed)
I was present at this dialysis session, have reviewed the session itself and made  appropriate changes  Vinson Moselleob Havana Baldwin MD (pgr) [phone]    [phone] 04/03/2013, 12:18 PM

## 2013-04-03 NOTE — Consult Note (Signed)
Reason for Consult: depression and anxiety Referring Physician: Dr. Chase Caller  Jasmine [name] is an 43 y.o. female.  HPI: Patient was seen and chart reviewed. Patient stated that she has been struggling with increased symptoms of anxiety including SOB and heavy chest since been hospitlaizaed. She is unable to sleep due to anxiety. Patient has history of anxiety disorder over twenty years ago and does not required medication management. She lives with her husband and works at [employer]. She has denied symptoms of depression, mania, PTSD and psychosis. She has no evidence of suicidal or homicidal ideations, intention or plans. She is willing to get medication to control her anxiety and insomnia.   Medical history: Jasmine [name] is a 43 y.o. female with a past medical history of poorly controlled insulin-dependent diabetes mellitus, nonobstructive coronary artery disease per cardiac catheterization in 2010, hypertension, stage III chronic kidney disease, presenting to the emergency department with complaints of elevated blood sugars.   Review of Systems: Constitutional: No weight loss, night sweats, Positive for Fevers, chills, fatigue. HEENT: No headaches, Difficulty swallowing,Tooth/dental problems,Sore throat, No sneezing, itching, ear ache, nasal congestion, post nasal drip, Cardio-vascular: No chest pain, Orthopnea, PND, swelling in lower extremities, anasarca, dizziness, palpitations GI: No heartburn, indigestion, abdominal pain, positive for nausea, vomiting Resp: No shortness of breath with exertion or at rest. No excess mucus, no productive cough, No non-productive cough, No coughing up of blood.No change in color of mucus.No wheezing.No chest wall deformity Skin: No rashes GU: no dysuria, change in color of urine, no urgency or frequency. No flank pain. Musculoskeletal: No joint pain or swelling. No decreased range of motion. No back pain. Psych: No change in mood or affect. No depression or anxiety. No  memory loss   Mental Status Examination: Patient appeared as per his stated age and fairly groomed, and maintaining good eye contact. Patient has anxious mood and affect.  She has normal rate, rhythm, and volume of speech. Her thought process is linear and goal directed. Patient has denied suicidal, homicidal ideations, intentions or plans. Patient has no evidence of auditory or visual hallucinations, delusions, and paranoia. Patient has fair insight judgment and impulse control.  Past Medical History  Diagnosis Date  . Diastolic heart failure     echo 11/03/10 EF 55-60%  . Hypertension   . Chronic kidney disease (CKD)     stage II-III  . Hyperlipidemia   . Uncontrolled diabetes mellitus   . Diabetic ketoacidosis     with seizures  . Anemia   . Diabetic gastroparesis   . Obesity   . Coronary artery disease     mild per cath 2010  . 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 UTI (lower urinary tract infection) 01/19/2012  . Pneumonia due to unspecified Streptococcus 10/16/2012  . Acute on chronic diastolic heart failure 62/69/4854  . Acute on chronic renal failure 01/18/2012  . Acute respiratory failure with hypoxia 10/08/2012    Past Surgical History  Procedure Laterality Date  . Cholecystectomy    . Carpal tunnel release  2003    Family History  Problem Relation Age of Onset  . Heart attack Mother   . Dementia Father     Social History:  reports that she has never smoked. She has never used smokeless tobacco. She reports that she drinks alcohol. She reports that she does not use illicit drugs.  Allergies:  Allergies  Allergen Reactions  . Contrast Media [Iodinated Diagnostic Agents] Nausea And Vomiting  .  Paroxetine Nausea And Vomiting  . Oxycodone Itching and Rash    Medications: I have reviewed the patient's current medications.  Results for orders placed during the hospital encounter of 03/23/13 (from the past 48 hour(s))  GLUCOSE, CAPILLARY      Status: Abnormal   Collection Time    04/01/13  3:19 PM      Result Value Ref Range   Glucose-Capillary 142 (*) 70 - 99 mg/dL  GLUCOSE, CAPILLARY     Status: Abnormal   Collection Time    04/01/13  7:35 PM      Result Value Ref Range   Glucose-Capillary 137 (*) 70 - 99 mg/dL   Comment 1 Notify RN    GLUCOSE, CAPILLARY     Status: Abnormal   Collection Time    04/01/13 11:59 PM      Result Value Ref Range   Glucose-Capillary 174 (*) 70 - 99 mg/dL   Comment 1 Notify RN    GLUCOSE, CAPILLARY     Status: Abnormal   Collection Time    04/02/13  4:19 AM      Result Value Ref Range   Glucose-Capillary 153 (*) 70 - 99 mg/dL   Comment 1 Notify RN    RENAL FUNCTION PANEL     Status: Abnormal   Collection Time    04/02/13  5:00 AM      Result Value Ref Range   Sodium 139  137 - 147 mEq/L   Potassium 3.8  3.7 - 5.3 mEq/L   Chloride 97  96 - 112 mEq/L   CO2 22  19 - 32 mEq/L   Glucose, Bld 169 (*) 70 - 99 mg/dL   BUN 64 (*) 6 - 23 mg/dL   Creatinine, Ser 4.72 (*) 0.50 - 1.10 mg/dL   Calcium 9.6  8.4 - 10.5 mg/dL   Phosphorus 7.1 (*) 2.3 - 4.6 mg/dL   Albumin 2.8 (*) 3.5 - 5.2 g/dL   GFR calc non Af Amer 10 (*) >90 mL/min   GFR calc Af Amer 12 (*) >90 mL/min   Comment: (NOTE)     The eGFR has been calculated using the CKD EPI equation.     This calculation has not been validated in all clinical situations.     eGFR's persistently <90 mL/min signify possible Chronic Kidney     Disease.  CBC     Status: Abnormal   Collection Time    04/02/13  5:00 AM      Result Value Ref Range   WBC 21.3 (*) 4.0 - 10.5 K/uL   RBC 3.39 (*) 3.87 - 5.11 MIL/uL   Hemoglobin 9.8 (*) 12.0 - 15.0 g/dL   HCT 30.2 (*) 36.0 - 46.0 %   MCV 89.1  78.0 - 100.0 fL   MCH 28.9  26.0 - 34.0 pg   MCHC 32.5  30.0 - 36.0 g/dL   RDW 14.4  11.5 - 15.5 %   Platelets 436 (*) 150 - 400 K/uL  GLUCOSE, CAPILLARY     Status: Abnormal   Collection Time    04/02/13  8:19 AM      Result Value Ref Range    Glucose-Capillary 171 (*) 70 - 99 mg/dL  GLUCOSE, CAPILLARY     Status: Abnormal   Collection Time    04/02/13 11:49 AM      Result Value Ref Range   Glucose-Capillary 164 (*) 70 - 99 mg/dL  GLUCOSE, CAPILLARY     Status: Abnormal  Collection Time    04/02/13  3:45 PM      Result Value Ref Range   Glucose-Capillary 139 (*) 70 - 99 mg/dL  GLUCOSE, CAPILLARY     Status: Abnormal   Collection Time    04/02/13  6:58 PM      Result Value Ref Range   Glucose-Capillary 147 (*) 70 - 99 mg/dL  GLUCOSE, CAPILLARY     Status: Abnormal   Collection Time    04/03/13 12:16 AM      Result Value Ref Range   Glucose-Capillary 209 (*) 70 - 99 mg/dL   Comment 1 Documented in Chart     Comment 2 Notify RN    GLUCOSE, CAPILLARY     Status: Abnormal   Collection Time    04/03/13  4:13 AM      Result Value Ref Range   Glucose-Capillary 137 (*) 70 - 99 mg/dL   Comment 1 Documented in Chart     Comment 2 Notify RN    GLUCOSE, CAPILLARY     Status: Abnormal   Collection Time    04/03/13  8:14 AM      Result Value Ref Range   Glucose-Capillary 181 (*) 70 - 99 mg/dL  CBC     Status: Abnormal   Collection Time    04/03/13 12:02 PM      Result Value Ref Range   WBC 15.5 (*) 4.0 - 10.5 K/uL   RBC 3.23 (*) 3.87 - 5.11 MIL/uL   Hemoglobin 9.2 (*) 12.0 - 15.0 g/dL   HCT 28.0 (*) 36.0 - 46.0 %   MCV 86.7  78.0 - 100.0 fL   MCH 28.5  26.0 - 34.0 pg   MCHC 32.9  30.0 - 36.0 g/dL   RDW 13.9  11.5 - 15.5 %   Platelets 362  150 - 400 K/uL  RENAL FUNCTION PANEL     Status: Abnormal   Collection Time    04/03/13 12:02 PM      Result Value Ref Range   Sodium 136 (*) 137 - 147 mEq/L   Potassium 3.7  3.7 - 5.3 mEq/L   Chloride 94 (*) 96 - 112 mEq/L   CO2 22  19 - 32 mEq/L   Glucose, Bld 216 (*) 70 - 99 mg/dL   BUN 76 (*) 6 - 23 mg/dL   Creatinine, Ser 4.93 (*) 0.50 - 1.10 mg/dL   Calcium 9.2  8.4 - 10.5 mg/dL   Phosphorus 6.6 (*) 2.3 - 4.6 mg/dL   Albumin 2.7 (*) 3.5 - 5.2 g/dL   GFR calc non Af  Amer 10 (*) >90 mL/min   GFR calc Af Amer 12 (*) >90 mL/min   Comment: (NOTE)     The eGFR has been calculated using the CKD EPI equation.     This calculation has not been validated in all clinical situations.     eGFR's persistently <90 mL/min signify possible Chronic Kidney     Disease.    No results found.  Positive for anxiety and sleep disturbance Blood pressure 114/64, pulse 82, temperature 97.9 F (36.6 C), temperature source Oral, resp. rate 22, height _0  (1.702 m), weight 117.4 kg (258 lb 13.1 oz), last menstrual period 02/26/2013, SpO2 96.00%.   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 04/03/2013, 1:57 PM

## 2013-04-03 NOTE — Progress Notes (Signed)
PULMONARY / CRITICAL CARE MEDICINE  Name: Jasmine [name] MRN: [id number] DOB: Feb 17, 1970    ADMISSION DATE:  03/23/2013 CONSULTATION DATE:  04/03/2013  REFERRING MD :  [facility] PRIMARY SERVICE: PCCM  CHIEF COMPLAINT:  Dsypnea  BRIEF PATIENT DESCRIPTION: 43 yo with poorly controlled insulin-dependent diabetes mellitus admitted 2/27 for L buttock abscess, sepsis and DKA. Intubated for hypoxemic respiratory failure.  LINES / TUBES: ETT 3/3 >>> 3/4; 3/4 >> 3/08 R IJ HD cath 3/3 >>>  CULTURES: 2/27  MRCA PCR >>> neg 2/27  Blood >>> neg 2/27  Wound >>>  PROTEUS  ANTIBIOTICS: Vancomycin  2/27 >>> 3/4 Zosyn  2/27 >>> 3/5 Levaquin 3/1 >>> 3/4 Ceftriaxone 3/5 >>>   SIGNIFICANT EVENTS / STUDIES:  2/27  Incision and drainage of left buttock abscess 3/1    CT abdomen >>> ASD BLL, small BL effusions, IUD 3/3    Intubated 3/4    Transferred to [facility] , CVVHD started 3/4     Failed extubation >>> anxiety, tachycardia, pulmonary edema >>> reintubated 3/8 - extubated 04/02/13: very anxious with hyperventilation. Needed to breathe in closed bag circuit, ativan and restarted precedex -> only some improvement with this   SUBJECTIVE/OVERNIGHT/INTERVAL HX 04/03/13: Anzious and spent night sitting and sleeping. Cried when told 50% chance of permanent HD apparently. HD pending today. But now, precedex off and holding ground and calm. Did get some morphine and ativan overnight   VITAL SIGNS: Temp:  [98.1 F (36.7 C)-99.3 F (37.4 C)] 98.1 F (36.7 C) (03/10 0835) Pulse Rate:  [49-99] 61 (03/10 0800) Resp:  [17-34] 20 (03/10 0000) BP: (99-149)/(47-117) 142/92 mmHg (03/10 0800) SpO2:  [92 %-100 %] 97 % (03/10 0800)  HEMODYNAMICS:   VENTILATOR SETTINGS:    INTAKE / OUTPUT: Intake/Output     03/09 0701 - 03/10 0700 03/10 0701 - 03/11 0700   P.O. 290    I.V. (mL/kg) 486.7 (4.2) 23.7 (0.2)   IV Piggyback 50    Total Intake(mL/kg) 826.7 (7.1) 23.7 (0.2)   Urine (mL/kg/hr) 315 (0.1) 85 (0.2)    Total Output 315 85   Net +511.7 -61.3        Stool Occurrence 1 x      PHYSICAL EXAMINATION: Gen: Sleeping in chair. Woke up with a smile seeing her husband ENT: WNL Neck: No JVD Lungs: Clear. Somehoarse breathing; no stridor Cardiovascular: Regular, no murmurs Abdomen: Obese, soft Musculoskeletal: Moves all extremities Neuro:  Less. AXOx3.  Skin:  Warm, no lesions / rash   PULMONARY  Recent Labs Lab 03/27/13 1213 03/27/13 1423 03/27/13 2209 03/28/13 1505  PHART 7.345* 7.343*  --  7.378  PCO2ART 30.2* 30.4*  --  34.6*  PO2ART 65.7* 81.0  --  101.0*  HCO3 16.0* 16.6* 20.2 20.4  TCO2 15.5 17 21 21   O2SAT 92.1 96.0 73.0 98.0    CBC  Recent Labs Lab 03/31/13 0430 04/01/13 0500 04/02/13 0500  HGB 9.0* 9.5* 9.8*  HCT 27.5* 29.1* 30.2*  WBC 16.9* 17.0* 21.3*  PLT 378 363 436*    COAGULATION No results found for this basename: INR,  in the last 168 hours  CARDIAC  No results found for this basename: TROPONINI,  in the last 168 hours No results found for this basename: PROBNP,  in the last 168 hours   CHEMISTRY  Recent Labs Lab 03/28/13 0400 03/28/13 1600 03/29/13 0500 03/29/13 1615 03/30/13 0500 03/30/13 1600 03/31/13 0430 04/01/13 0500 04/02/13 0500  NA 139 139 139 137 136* 135* 136* 136*  139  K 3.4* 4.0 3.9 3.8 3.8 3.9 3.8 4.1 3.8  CL 101 99 99 98 96 96 97 94* 97  CO2 21 20 22 23 23 26 23 24 22   GLUCOSE 113* 160* 137* 135* 205* 179* 164* 204* 169*  BUN 59* 44* 35* 31* 31* 37* 51* 43* 64*  CREATININE 2.50* 2.10* 2.05* 2.06* 2.01* 2.35* 3.30* 3.41* 4.72*  CALCIUM 8.6 8.7 9.2 9.0 9.5 9.7 9.9 9.4 9.6  MG 2.3  --  2.7*  --  2.7*  --  3.1*  --   --   PHOS 3.5 3.5 2.9 3.1  --  4.6  --   --  7.1*   Estimated Creatinine Clearance: 20.5 ml/min (by C-G formula based on Cr of 4.72).   LIVER  Recent Labs Lab 03/28/13 1600 03/29/13 0500 03/29/13 1615 03/30/13 1600 04/02/13 0500  ALBUMIN 2.3* 2.4* 2.5* 2.7* 2.8*     INFECTIOUS  Recent  Labs Lab 03/27/13 1120  LATICACIDVEN 1.1     ENDOCRINE CBG (last 3)   Recent Labs  04/03/13 0016 04/03/13 0413 04/03/13 0814  GLUCAP 209* 137* 181*         IMAGING x48h  No results found.    ASSESSMENT / PLAN:  PULMONARY A: Acute respiratory failure  Acute pulmonary edema    - 04/12/13:  hyperventilating with anxiety and this is causing stridor despite precedex and ativan x 1  - 04/03/13: improved anxiety and hyperventilation; precedex off P:   Ativan prn; repeat Morphine prn for dyspnea;    CARDIOVASCULAR A: Acute on chronic diastolic heart failure Tachycardia, resolved P:  Cont Lipitor, ASA PRN metoprolol to maintain HR < 110/min  RENAL A:   Acute on chronic renal failure. Sp CRRT ending 03/30/13. Last HD 03/31/13   P:   HD per Renal; repeat pending 04/03/13 Monitor BMET intermittently Monitor I/Os Correct electrolytes as indicated  GASTROINTESTINAL A:   No issues P:   SUP: N/I post extubation Start renal dm diet; with nutrition consult  HEMATOLOGIC A:   Anemia P:  Trend CBC Transfuse for Hgb < 7.0 Cont Aranesp  INFECTIOUS A:   Left buttock abscess with PROTEUS. No indication for exploration 04/02/13 P:   Surgery following Complete 14 days abx (stop date in place)  ENDOCRINE A:    DM Hyperglycemia DKA resolved P:   Cont SSI - change to ACHS when diet started Cont Lantus  NEUROLOGIC A:   Severe anxiety, uncontrolled on 04/02/13. Husband says insomnia is cause    - 04/03/13: Off precedex. Improved anxiety. Not suicidal P:   Prn ativan and prn morphine Ambien for sleep QHS prn Psych consult pending (called 04/02/13)   GLOBAL 04/03/13: husband updated. Move to med-surg on [facility] for pick up 04/04/13    Dr. Kalman ShanMurali Insiya Oshea, M.D., [facility] CenterF.C.C.P Pulmonary and Critical Care Medicine Staff Physician [facility] [place] [facility] Pager: [phone], If no answer or between  15:00h - 7:00h: call [phone]  04/03/2013 10:15 AM

## 2013-04-03 NOTE — Progress Notes (Signed)
  [facility] Progress Note   Subjective: MS better today, weaning precedex. UOP remains poor  Filed Vitals:   04/03/13 0600 04/03/13 0700 04/03/13 0800 04/03/13 0835  BP: 136/70 149/117 142/92   Pulse: 62 62 61   Temp:    98.1 F (36.7 C)  TempSrc:    Oral  Resp:      Height:      Weight:      SpO2: 95% 92% 97%    Exam: Calm, ox3, alert No JVD Chest faint rales R base RRR no MRG Abd soft, obese, nt, nd No LE edema  R neck temp HD cath Neuro is nf  CXR still wet 3/8 film at 117kg  Summary: Pt is a 43 y.o. yo female who was admitted on 03/23/2013 with DKA/buttock abscess and Acute on CRF. Had CRRT 3/4 - 3/6.    Assessment: 1 Acute on CRF- s/p CRRT, last HD on 3/7, no sign of recovery yet 2 CKD stage 4, baseline creat 1.8-2.5 3 DM w multiple complications 4 Pulm edema- still vol excess 5 L buttock abcess s/p I&D, on IV abx 6 Anemia on darbe 100/wk, Hb 9.2 7 HTN was on 3 BP meds + 2 diuretics at home > none here, BP normal, still vol overloaded   Plan- HD today, max UF; explained to patient that with baseline CKD stage 4 that chances of recovery are low to midrange; will need tunneled cath soon if not recovering    Vinson Moselleob Mikaella Escalona MD  pager [phone]    cell [phone]  04/03/2013, 10:17 AM     Recent Labs Lab 03/29/13 1615  03/30/13 1600 03/31/13 0430 04/01/13 0500 04/02/13 0500  NA 137  < > 135* 136* 136* 139  K 3.8  < > 3.9 3.8 4.1 3.8  CL 98  < > 96 97 94* 97  CO2 23  < > 26 23 24 22   GLUCOSE 135*  < > 179* 164* 204* 169*  BUN 31*  < > 37* 51* 43* 64*  CREATININE 2.06*  < > 2.35* 3.30* 3.41* 4.72*  CALCIUM 9.0  < > 9.7 9.9 9.4 9.6  PHOS 3.1  --  4.6  --   --  7.1*  < > = values in this interval not displayed.  Recent Labs Lab 03/29/13 1615 03/30/13 1600 04/02/13 0500  ALBUMIN 2.5* 2.7* 2.8*    Recent Labs Lab 03/31/13 0430 04/01/13 0500 04/02/13 0500  WBC 16.9* 17.0* 21.3*  HGB 9.0* 9.5* 9.8*  HCT 27.5* 29.1* 30.2*  MCV  88.1 89.5 89.1  PLT 378 363 436*   . aspirin  81 mg Oral Daily  . atorvastatin  40 mg Oral q1800  . cefTRIAXone (ROCEPHIN)  IV  2 g Intravenous Q24H  . darbepoetin (ARANESP) injection - DIALYSIS  100 mcg Intravenous Q Tue-HD  . enoxaparin (LOVENOX) injection  30 mg Subcutaneous Q24H  . feeding supplement (NEPRO CARB STEADY)  237 mL Oral BID BM  . insulin aspart  0-20 Units Subcutaneous Q4H  . insulin glargine  25 Units Subcutaneous Daily  . LORazepam  2 mg Intravenous Once  . LORazepam  2 mg Intravenous Once   . sodium chloride 250 mL (03/28/13 1100)  . dexmedetomidine Stopped (04/03/13 [id number])   sodium chloride, sodium chloride, acetaminophen, albuterol, fentaNYL, heparin, heparin, lidocaine (PF), lidocaine-prilocaine, LORazepam, metoprolol, morphine injection, nitroGLYCERIN, ondansetron (ZOFRAN) IV, pentafluoroprop-tetrafluoroeth

## 2013-04-03 NOTE — Care Management Note (Signed)
    Page 1 of 2   04/03/2013     2:47:59 PM   CARE MANAGEMENT NOTE 04/03/2013  Patient:  Jasmine [name],Jasmine [name]   Account Number:  [id number]  Date Initiated:  03/26/2013  Documentation initiated by:  DAVIS,RHONDA  Subjective/Objective Assessment:   diabetic with wound to buttock with purulent drainage requiring i&d, iv insulin ongoing for control.     Action/Plan:   home when stable   Anticipated DC Date:  03/29/2013   Anticipated DC Plan:  HOME/SELF CARE  In-house referral  NA      DC Planning Services  NA      [facility]   Choice offered to / List presented to:  NA   DME arranged  NA      DME agency  NA     HH arranged  NA      HH agency  NA   Status of service:  In process, will continue to follow Medicare Important Message given?  NA - LOS <3 / Initial given by admissions (If response is "NO", the following Medicare IM given date fields will be blank) Date Medicare IM given:   Date Additional Medicare IM given:    Discharge Disposition:    Per UR Regulation:  Reviewed for med. necessity/level of care/duration of stay  If discussed at Long Length of Stay Meetings, dates discussed:   03/29/2013  04/03/2013    Comments:  Contact:  Dennis,Cedric Significant other   [phone]  04-03-13 2:35pm Avie ArenasSarah Cullan Launer, RNBSN- 336 [phone] Tolerating [place] without precedex. In HD now - Plan for tx to M/S today.  Has [facility] benefits - Discussed case with Dr. Marchelle Gearingamaswamy - Placed [facility] consult, but wants psych to see first with recommendations - no psych in [facility]'s.   Order placed for [facility] referral. Referral made.  [id number]/Rhonda Stark JockDavis, RN, BSN, [place]CCM [phone] Chart Reviewed for discharge and hospital needs. Discharge needs at time of review:  None present will follow for needs. Review of patient progress due on [id number].

## 2013-04-04 ENCOUNTER — Inpatient Hospital Stay (HOSPITAL_COMMUNITY): Payer: 59

## 2013-04-04 LAB — GLUCOSE, CAPILLARY
GLUCOSE-CAPILLARY: 238 mg/dL — AB (ref 70–99)
GLUCOSE-CAPILLARY: 243 mg/dL — AB (ref 70–99)
GLUCOSE-CAPILLARY: 221 mg/dL — AB (ref 70–99)
Glucose-Capillary: 186 mg/dL — ABNORMAL HIGH (ref 70–99)

## 2013-04-04 LAB — CBC
HCT: 31.4 % — ABNORMAL LOW (ref 36.0–46.0)
HEMOGLOBIN: 10.1 g/dL — AB (ref 12.0–15.0)
MCH: 28.3 pg (ref 26.0–34.0)
MCHC: 32.2 g/dL (ref 30.0–36.0)
MCV: 88 fL (ref 78.0–100.0)
Platelets: 454 10*3/uL — ABNORMAL HIGH (ref 150–400)
RBC: 3.57 MIL/uL — ABNORMAL LOW (ref 3.87–5.11)
RDW: 14 % (ref 11.5–15.5)
WBC: 19.5 10*3/uL — AB (ref 4.0–10.5)

## 2013-04-04 LAB — CLOSTRIDIUM DIFFICILE BY PCR: CDIFFPCR: NEGATIVE

## 2013-04-04 LAB — BASIC METABOLIC PANEL
BUN: 36 mg/dL — ABNORMAL HIGH (ref 6–23)
CO2: 22 mEq/L (ref 19–32)
Calcium: 8.9 mg/dL (ref 8.4–10.5)
Chloride: 90 mEq/L — ABNORMAL LOW (ref 96–112)
Creatinine, Ser: 4.13 mg/dL — ABNORMAL HIGH (ref 0.50–1.10)
GFR, EST AFRICAN AMERICAN: 14 mL/min — AB (ref >90)
GFR, EST NON AFRICAN AMERICAN: 12 mL/min — AB (ref >90)
Glucose, Bld: 214 mg/dL — ABNORMAL HIGH (ref 70–99)
Potassium: 3.9 mEq/L (ref 3.7–5.3)
Sodium: 135 mEq/L — ABNORMAL LOW (ref 137–147)

## 2013-04-04 MED ORDER — METOCLOPRAMIDE HCL 5 MG/ML IJ SOLN
5.0000 mg | Freq: Four times a day (QID) | INTRAMUSCULAR | Status: DC
  Filled 2013-04-04 (×3): qty 1

## 2013-04-04 MED ORDER — INSULIN GLARGINE 100 UNIT/ML ~~LOC~~ SOLN
30.0000 [IU] | Freq: Every day | SUBCUTANEOUS | Status: DC
Start: 2013-04-05 — End: 2013-04-06
  Administered 2013-04-05: 30 [IU] via SUBCUTANEOUS
  Filled 2013-04-04 (×2): qty 0.3

## 2013-04-04 MED ORDER — METOCLOPRAMIDE HCL 5 MG/ML IJ SOLN
5.0000 mg | Freq: Two times a day (BID) | INTRAMUSCULAR | Status: DC
  Administered 2013-04-04 – 2013-04-08 (×8): 5 mg via INTRAVENOUS
  Filled 2013-04-04 (×3): qty 1
  Filled 2013-04-04 (×2): qty 2
  Filled 2013-04-04 (×7): qty 1

## 2013-04-04 MED ORDER — ONDANSETRON HCL 4 MG/2ML IJ SOLN
4.0000 mg | Freq: Four times a day (QID) | INTRAMUSCULAR | Status: DC
  Administered 2013-04-04 – 2013-04-05 (×5): 4 mg via INTRAVENOUS
  Filled 2013-04-04 (×7): qty 2

## 2013-04-04 NOTE — Progress Notes (Signed)
  [facility] Progress Note   Subjective: awaiting floor bed, no complaints, upset about kidney problems.  +N/V. Sharp BP drop in HD yesterday, about 3.5 kg off net   Filed Vitals:   04/04/13 0400 04/04/13 0432 04/04/13 0500 04/04/13 0600  BP:   122/67 142/87  Pulse: 96  115 101  Temp:  100.2 F (37.9 C)    TempSrc:  Oral    Resp:      Height:      Weight:      SpO2: 100%  100% 100%   Exam: Calm, ox3, alert No JVD Chest R clear, dec'd L base RRR no MRG Abd soft, obese, nt, nd No LE edema  R neck temp HD cath Neuro is nf  CXR still wet 3/8 film at 117kg  Summary: Pt is a 43 y.o. yo female who was admitted on 03/23/2013 with DKA/buttock abscess and Acute on CRF. Had CRRT 3/4 - 3/6.    Assessment: 1 Acute on CRF- HD dependent, no sign of recovery yet 2 CKD stage 3/4, baseline creat 1.8-2.1 3 DM w multiple complications 4 Pulm edema- resolved clinically 5 L buttock abcess s/p I&D, on IV abx 6 Anemia on darbe 100/wk, Hb 9.2 7 HTN was on 3 BP meds + 2 diuretics at home > none here   Plan- HD tomorrow, d/c foley, repeat cxr, await possible renal recovery    Vinson Moselleob Blayton Huttner MD  pager [phone]    cell [phone]  04/04/2013, 9:20 AM     Recent Labs Lab 03/30/13 1600  04/02/13 0500 04/03/13 1202 04/04/13 0530  NA 135*  < > 139 136* 135*  K 3.9  < > 3.8 3.7 3.9  CL 96  < > 97 94* 90*  CO2 26  < > 22 22 22   GLUCOSE 179*  < > 169* 216* 214*  BUN 37*  < > 64* 76* 36*  CREATININE 2.35*  < > 4.72* 4.93* 4.13*  CALCIUM 9.7  < > 9.6 9.2 8.9  PHOS 4.6  --  7.1* 6.6*  --   < > = values in this interval not displayed.  Recent Labs Lab 03/30/13 1600 04/02/13 0500 04/03/13 1202  ALBUMIN 2.7* 2.8* 2.7*    Recent Labs Lab 04/02/13 0500 04/03/13 1202 04/04/13 0008  WBC 21.3* 15.5* 19.5*  HGB 9.8* 9.2* 10.1*  HCT 30.2* 28.0* 31.4*  MCV 89.1 86.7 88.0  PLT 436* 362 454*   . aspirin  81 mg Oral Daily  . atorvastatin  40 mg Oral q1800  .  cefTRIAXone (ROCEPHIN)  IV  2 g Intravenous Q24H  . clonazePAM  0.5 mg Oral BID  . darbepoetin (ARANESP) injection - DIALYSIS  100 mcg Intravenous Q Tue-HD  . enoxaparin (LOVENOX) injection  30 mg Subcutaneous Q24H  . feeding supplement (NEPRO CARB STEADY)  237 mL Oral BID BM  . insulin aspart  0-20 Units Subcutaneous TID WC & HS  . insulin glargine  25 Units Subcutaneous Daily  . LORazepam  2 mg Intravenous Once  . traZODone  50 mg Oral QHS     sodium chloride, sodium chloride, sodium chloride, sodium chloride, acetaminophen, albuterol, feeding supplement (NEPRO CARB STEADY), heparin, heparin, heparin, heparin, lidocaine (PF), lidocaine-prilocaine, LORazepam, metoprolol, morphine injection, nitroGLYCERIN, ondansetron (ZOFRAN) IV, pentafluoroprop-tetrafluoroeth, zolpidem

## 2013-04-04 NOTE — Consult Note (Addendum)
WOC wound consult note Reason for Consult: Pt received I&D in ER for abscess. WOC consult requested for left buttock full thickness wound prior to surgical team involvement.  This team has assessed site and ordered plan of care and topical treatment. Bedside nurse concerned with wound appearance and requested WOC assess.  Outer wound is 100% soft eschar, 3X2cm, small opening approx .2X.2cm is draining large amt thick tan pus. Increased tenderness and induration surrounding wound. Swab inserted to 4 cm. Pt on Sport low air loss bed to reduce pressure. CCS team plans to follow according to progress notes. Pt could benefit from debridement of nonviable tissue to outer wound and allow enhanced drainage from site. This is beyond [employer] [facility] [employer] IncWOC scope of practice. Defer to surgical team for further plan of care.   Please re-consult if further assistance is needed.  Thank-you,  Cammie Mcgeeawn Morrie Daywalt MSN, RN, CWOCN, [place] Point[place]CN-AP, CNS [phone]

## 2013-04-04 NOTE — Progress Notes (Signed)
PT Cancellation Note  Patient Details Name: Jasmine [name] MRN: [id number] DOB: 1970/05/28   Cancelled Treatment:    Reason Eval Not Completed: Medical issues which prohibited therapy. Pt has been vomiting and feels as if she will vomit right now. RN aware.    Alyx Mcguirk 04/04/2013, 11:45 AM Pager [phone]

## 2013-04-04 NOTE — Progress Notes (Signed)
[facility]  Jasmine [name] ZOX:[id number] DOB: 02/09/1970 DOA: 03/23/2013 PCP: Dois Davenport., MD  Brief narrative: 43 yo with poorly controlled insulin-dependent diabetes mellitus admitted 2/27 for L buttock abscess, sepsis and DKA. Intubated for hypoxemic respiratory failure.Buttock abscess was I/D per surgery. CT abdomen revealed airspace consolidation R > L with air bronchograms suggesting multifocal PNA. Also small bilat effusions and low lung volumes. Txd with Vanco and Zosyn. Cx found positive for strep. Sx's of volume overload persisted so Nephro consulted- initially used high dose IV Lasix but limited response and progressive renal failure. Was transferred to [facility] for CRRT. Stabilized and was able to be extubated. Remains on HD and possibly this will become chronic but still awaiting renal recovery. Pt depressed over current health status so psychiatry consulted  Assessment/Plan:    DKA (diabetic ketoacidoses)/DIABETES MELLITUS, TYPE I -resolved but CBGs > 200 -increased Lantus and adjust as need- cont SSI    Acute-on-chronic kidney injury/ CKD (chronic kidney disease), stage IV -per Nephro -cont HD for now -hopeful for renal recovery -limited UOP    Sepsis -initially due to PNA and buttock abscess -sepsis physiology has resolved -cont anbx's -still with low grade fevers (see below)    Acute respiratory failure with hypoxia:   A) Volume Overload   B) Strep PNA   C) Heart failure, diastolic, chronic   D) OSA (obstructive sleep apnea) -PNA initially then volume overload from volume resuscitation in setting of ARF- now negative 13,000 cc after HD -unclear if was compliant with CPAP at home -primary tx at this point is HD   Diarrhea -has leukocytosis and fevers -check fecal lactoferrin and C Diff -check UA/cx since has foley    Hypertension -controlled with HD    Gastroparesis -significant nausea explained by gastroparesis and  uremia -also could be 2/2 cause of diarrhea -add IV Reglan- dose adjusted by pharmacy for CrCl -add scheduled Zofran    Left buttock abscess/ Abscess, perirectal s/p I&D 03/23/2013 -per [facility] -cont current wound care -Wound culture positive for Proteus    DEPRESSION -Psych following -cont Desyrel    Anemia, chronic disease -cont Aranesp -hgb stable -has received 1 unit PRBCs this admit  DVT prophylaxis: Lovenox Code Status: Full code Family Communication: No family at bedside Disposition Plan/Expected LOS: Transfer to floor   Consultants: PCCM Nephrology Psychiatry General surgery  Procedures: 2/27 incision and drainage left buttock abscess  3/3 right IJ hemodialysis catheter  CULTURES:  2/27 MRCA PCR >>> neg  2/27 Blood >>> neg  2/27 Wound >>> PROTEUS  Antibiotics: Vancomycin 2/27 >>> 3/4  Zosyn 2/27 >>> 3/5  Levaquin 3/1 >>> 3/4  Ceftriaxone 3/5 >>>   HPI/Subjective: Patient awake. Complaining of underwent a nausea and occasional emesis. Also endorsing diarrhea.  Objective: Blood pressure 133/61, pulse 103, temperature 99.2 F (37.3 C), temperature source Oral, resp. rate 21, height 5\' 7"  (1.702 m), weight 250 lb 7.1 oz (113.6 kg), last menstrual period 02/26/2013, SpO2 100.00%.  Intake/Output Summary (Last 24 hours) at 04/04/13 1355 Last data filed at 04/04/13 0341  Gross per 24 hour  Intake    170 ml  Output   3429 ml  Net  -3259 ml     Exam: General: No acute respiratory distress-flat affect Lungs: Clear to auscultation bilaterally without wheezes or crackles, 2 L Cardiovascular: Regular rate and rhythm without murmur gallop or rub normal S1 and S2, no peripheral edema or JVD Abdomen: Tender palpation right lower  quadrant, nondistended, soft, bowel sounds positive, no rebound, no ascites, no appreciable mass Musculoskeletal: No significant cyanosis, clubbing of bilateral lower extremities Neurological: Alert and oriented x 3, moves all  extremities x 4 without focal neurological deficits, CN 2-12 intact  Scheduled Meds:  Scheduled Meds: . aspirin  81 mg Oral Daily  . atorvastatin  40 mg Oral q1800  . cefTRIAXone (ROCEPHIN)  IV  2 g Intravenous Q24H  . clonazePAM  0.5 mg Oral BID  . darbepoetin (ARANESP) injection - DIALYSIS  100 mcg Intravenous Q Tue-HD  . enoxaparin (LOVENOX) injection  30 mg Subcutaneous Q24H  . feeding supplement (NEPRO CARB STEADY)  237 mL Oral BID BM  . insulin aspart  0-20 Units Subcutaneous TID WC & HS  . insulin glargine  25 Units Subcutaneous Daily  . traZODone  50 mg Oral QHS   Continuous Infusions:   Data Reviewed: Basic Metabolic Panel:  Recent Labs Lab 03/29/13 0500 03/29/13 1615 03/30/13 0500 03/30/13 1600 03/31/13 0430 04/01/13 0500 04/02/13 0500 04/03/13 1202 04/04/13 0530  NA 139 137 136* 135* 136* 136* 139 136* 135*  K 3.9 3.8 3.8 3.9 3.8 4.1 3.8 3.7 3.9  CL 99 98 96 96 97 94* 97 94* 90*  CO2 22 23 23 26 23 24 22 22 22   GLUCOSE 137* 135* 205* 179* 164* 204* 169* 216* 214*  BUN 35* 31* 31* 37* 51* 43* 64* 76* 36*  CREATININE 2.05* 2.06* 2.01* 2.35* 3.30* 3.41* 4.72* 4.93* 4.13*  CALCIUM 9.2 9.0 9.5 9.7 9.9 9.4 9.6 9.2 8.9  MG 2.7*  --  2.7*  --  3.1*  --   --   --   --   PHOS 2.9 3.1  --  4.6  --   --  7.1* 6.6*  --    Liver Function Tests:  Recent Labs Lab 03/29/13 0500 03/29/13 1615 03/30/13 1600 04/02/13 0500 04/03/13 1202  ALBUMIN 2.4* 2.5* 2.7* 2.8* 2.7*   No results found for this basename: LIPASE, AMYLASE,  in the last 168 hours No results found for this basename: AMMONIA,  in the last 168 hours CBC:  Recent Labs Lab 03/31/13 0430 04/01/13 0500 04/02/13 0500 04/03/13 1202 04/04/13 0008  WBC 16.9* 17.0* 21.3* 15.5* 19.5*  HGB 9.0* 9.5* 9.8* 9.2* 10.1*  HCT 27.5* 29.1* 30.2* 28.0* 31.4*  MCV 88.1 89.5 89.1 86.7 88.0  PLT 378 363 436* 362 454*   Cardiac Enzymes: No results found for this basename: CKTOTAL, CKMB, CKMBINDEX, TROPONINI,  in the  last 168 hours BNP (last 3 results)  Recent Labs  10/07/12 2126 11/01/12 1446 03/24/13 1200  PROBNP 1631.0* 2441.0* 6479.0*   CBG:  Recent Labs Lab 04/03/13 1635 04/03/13 1858 04/03/13 2140 04/04/13 0820 04/04/13 1322  GLUCAP 102* 170* 198* 238* 243*    No results found for this or any previous visit (from the past 240 hour(s)).   Studies:  Recent x-ray studies have been reviewed in detail by the Attending Physician  Time spent :     Junious Silkllison Ellis, ANP [facility] Office  [phone] Pager [phone]  **If unable to reach the above provider after paging please contact the Flow Manager @ [phone]  On-Call/Text Page:      [email or web address]      password [email or web address]  If 7PM-7AM, please contact night-coverage [email or web address] Password [email or web address] 04/04/2013, 1:55 PM   LOS: 12 days   I have examined the patient, reviewed the chart and modified the above note which I agree with.  Deondray Ospina,MD Pager # on [email or web address] 04/04/2013, 4:01 PM

## 2013-04-04 NOTE — Progress Notes (Signed)
Pt alert oriented x 4.  Denies pain.  No s/s of any acute distress.  Transferred to 6E via chair.  Report given to GaffneyKami, [place]

## 2013-04-04 NOTE — Progress Notes (Signed)
Pt transferred to unit via chair. Pt is alert and oriented. Oriented pt to room, call bell, and bed in lowest position. Full assessment to epic. Dondra SpryMoore, Lenzie Sandler Islee, RN

## 2013-04-04 NOTE — Progress Notes (Signed)
Inpatient Diabetes Program Recommendations  AACE/ADA: New Consensus Statement on Inpatient Glycemic Control (2013)  Target Ranges:  Prepandial:   less than 140 mg/dL      Peak postprandial:   less than 180 mg/dL (1-2 hours)      Critically ill patients:  140 - 180 mg/dL   Reason for Visit: Results for Jasmine GentileMONROE, Jasmine L (MRN [id number]) as of 04/04/2013 15:52  Ref. Range 04/03/2013 21:40 04/04/2013 08:20 04/04/2013 13:22  Glucose-Capillary Latest Range: 70-99 mg/dL 409198 (H) 811238 (H) 914243 (H)  CBG's trending up.   Diabetes history: Type 2 Diabetes Outpatient Diabetes medications: Novolog 70/30 30-40 units bid Current orders for Inpatient glycemic control: Lantus 25 units daily and Novolog resistant tid with meals  Consider increasing Lantus to 30 units daily and add Novolog meal coverage 3 units tid with meals.  If Novolog meal coverage added, please decrease Novolog correction to sensitive.    Thanks, Beryl MeagerJenny Curren Mohrmann, RN, BC-ADM Inpatient Diabetes Coordinator Pager [phone]

## 2013-04-05 LAB — IRON AND TIBC
Iron: 51 ug/dL (ref 42–135)
Saturation Ratios: 28 % (ref 20–55)
TIBC: 182 ug/dL — ABNORMAL LOW (ref 250–470)
UIBC: 131 ug/dL (ref 125–400)

## 2013-04-05 LAB — GLUCOSE, CAPILLARY
GLUCOSE-CAPILLARY: 258 mg/dL — AB (ref 70–99)
GLUCOSE-CAPILLARY: 214 mg/dL — AB (ref 70–99)
Glucose-Capillary: 256 mg/dL — ABNORMAL HIGH (ref 70–99)

## 2013-04-05 LAB — FECAL LACTOFERRIN, QUANT: Fecal Lactoferrin: NEGATIVE

## 2013-04-05 LAB — FERRITIN: Ferritin: 1271 ng/mL — ABNORMAL HIGH (ref 10–291)

## 2013-04-05 MED ORDER — SODIUM CHLORIDE 0.9 % IV SOLN: 100.0000 mL | INTRAVENOUS | Status: DC | PRN

## 2013-04-05 MED ORDER — ONDANSETRON HCL 4 MG/2ML IJ SOLN
INTRAMUSCULAR | Status: AC
  Filled 2013-04-05: qty 2

## 2013-04-05 MED ORDER — HEPARIN SODIUM (PORCINE) 1000 UNIT/ML DIALYSIS: 2000.0000 [IU] | INTRAMUSCULAR | Status: DC | PRN

## 2013-04-05 MED ORDER — HEPARIN SODIUM (PORCINE) 1000 UNIT/ML DIALYSIS: 1000.0000 [IU] | INTRAMUSCULAR | Status: DC | PRN

## 2013-04-05 MED ORDER — LIDOCAINE HCL (PF) 1 % IJ SOLN: 5.0000 mL | INTRAMUSCULAR | Status: DC | PRN

## 2013-04-05 MED ORDER — RENA-VITE PO TABS
1.0000 | ORAL_TABLET | Freq: Every day | ORAL | Status: DC
  Administered 2013-04-05 – 2013-04-12 (×8): 1 via ORAL
  Filled 2013-04-05 (×9): qty 1

## 2013-04-05 MED ORDER — LORAZEPAM 2 MG/ML IJ SOLN
INTRAMUSCULAR | Status: AC
  Filled 2013-04-05: qty 1

## 2013-04-05 MED ORDER — CALCIUM ACETATE 667 MG PO CAPS
667.0000 mg | ORAL_CAPSULE | Freq: Three times a day (TID) | ORAL | Status: DC
  Administered 2013-04-05 – 2013-04-13 (×25): 667 mg via ORAL
  Filled 2013-04-05 (×27): qty 1

## 2013-04-05 MED ORDER — ALUM & MAG HYDROXIDE-SIMETH 200-200-20 MG/5ML PO SUSP
30.0000 mL | Freq: Four times a day (QID) | ORAL | Status: DC | PRN
  Administered 2013-04-05 – 2013-04-07 (×5): 30 mL via ORAL
  Filled 2013-04-05 (×5): qty 30

## 2013-04-05 MED ORDER — PENTAFLUOROPROP-TETRAFLUOROETH EX AERO
1.0000 | INHALATION_SPRAY | CUTANEOUS | Status: DC | PRN
Start: 2013-04-05 — End: 2013-04-05

## 2013-04-05 MED ORDER — NEPRO/CARBSTEADY PO LIQD: 237.0000 mL | ORAL | Status: DC | PRN

## 2013-04-05 MED ORDER — ALTEPLASE 2 MG IJ SOLR: 2.0000 mg | Freq: Once | INTRAMUSCULAR | Status: DC | PRN

## 2013-04-05 MED ORDER — DEXTROSE 5 % IV SOLN
2.0000 g | INTRAVENOUS | Status: AC
  Administered 2013-04-06 – 2013-04-07 (×2): 2 g via INTRAVENOUS
  Filled 2013-04-05 (×2): qty 2

## 2013-04-05 MED ORDER — LIDOCAINE-PRILOCAINE 2.5-2.5 % EX CREA: 1.0000 "application " | TOPICAL_CREAM | CUTANEOUS | Status: DC | PRN

## 2013-04-05 MED ORDER — ENOXAPARIN SODIUM 30 MG/0.3ML ~~LOC~~ SOLN
30.0000 mg | SUBCUTANEOUS | Status: DC
  Administered 2013-04-07 – 2013-04-13 (×7): 30 mg via SUBCUTANEOUS
  Filled 2013-04-05 (×7): qty 0.3

## 2013-04-05 NOTE — Progress Notes (Signed)
Inpatient Diabetes Program Recommendations  AACE/ADA: New Consensus Statement on Inpatient Glycemic Control (2013)  Target Ranges:  Prepandial:   less than 140 mg/dL      Peak postprandial:   less than 180 mg/dL (1-2 hours)      Critically ill patients:  140 - 180 mg/dL   Pt takes 45/4070/30 at home, 40 units in the am and 30 units ac supper. Total NPH dose per day calculates to 46 units and Novolog 24 units. May need adjust regimen here closer to home regimen, i.e., lantus again to 40 units and 3-4 units novolog meal coverage tidwc (continue resistant correction as ordered or decrease to sensitive and order meal coverage of 6-8 units tidwc.)  Please note that the HS correction ordered is resistant rather than the HS scale.  However, the cbg's are sustained in the 200's.  Thank you, Lenor CoffinAnn Atha Mcbain, RN, CNS, Diabetes Coordinator [phone])

## 2013-04-05 NOTE — Progress Notes (Addendum)
Progress Note  Jasmine [name] WUJ:[id number] DOB: 23-Apr-1970 DOA: 03/23/2013 PCP: Dois Davenport., MD  Brief narrative: 43 yo with poorly controlled insulin-dependent diabetes mellitus admitted 2/27 for L buttock abscess, sepsis and DKA. Intubated for hypoxemic respiratory failure.Buttock abscess was I/D per surgery. CT abdomen revealed airspace consolidation R > L with air bronchograms suggesting multifocal PNA. Also small bilat effusions and low lung volumes. Txd with Vanco and Zosyn. Cx found positive for strep. Sx's of volume overload persisted so Nephro consulted- initially used high dose IV Lasix but limited response and progressive renal failure. Was transferred to [facility] for CRRT. Stabilized and was able to be extubated. Remains on HD and possibly this will become chronic but still awaiting renal recovery. Pt depressed over current health status so psychiatry consulted  Assessment/Plan: DKA (diabetic ketoacidoses)/DIABETES MELLITUS, TYPE I -resolved  -CBG's stable now -continue lantus and SSI  Acute-on-chronic kidney injury/ CKD (chronic kidney disease), stage IV -per Nephrology -cont HD for now -hopping for renal recovery -patient anuric/oliguric currently  Sepsis -due to PNA and buttock abscess -sepsis physiology has resolved -cont anbx's to complete therapy -continue wound care -still with some low grade fevers; no etiology appreciated  Acute respiratory failure with hypoxia: A) Volume Overload B) Strep PNA C) Heart failure, diastolic, chronic D) OSA (obstructive sleep apnea) -PNA initially then volume overload from volume resuscitation in setting of ARF- now negative 13,000 cc after HD -continue CPAP -primary tx at this point is HD  Diarrhea -has leukocytosis and low grade fevers -C. Diff neg -continue rocephin -associated to side effects of abx's; will start probiotic   Hypertension and volume -controlled with HD  Gastroparesis -significant nausea  explained by gastroparesis and uremia -also could be 2/2 cause of diarrhea -continue IV Reglan- dose adjusted by pharmacy for CrCl -continue scheduled Zofran  Left buttock abscess/ Abscess, perirectal s/p I&D 03/23/2013 -per [facility] -cont current wound care -Wound culture positive for Proteus -will complete a total of 7 days of ceftriaxone given slight elevation on CBC's and low grade temp  DEPRESSION and anxiety -Psych has seen patient and recommended klonopin 0.5 mg BID and trazodone 50mg  QHS -no SI  Anemia, chronic disease -cont Aranesp -hgb stable -has received 1 unit PRBCs during this admission   DVT prophylaxis: Lovenox Code Status: Full code Family Communication: No family at bedside Disposition Plan/Expected LOS: Transfer to floor  Consultants: PCCM Nephrology Psychiatry General surgery IR  Procedures: 2/27 incision and drainage left buttock abscess  3/3 right IJ hemodialysis catheter  CULTURES:  2/27 MRCA PCR >>> neg  2/27 Blood >>> neg  2/27 Wound >>> PROTEUS  Antibiotics: Vancomycin 2/27 >>> 3/4  Zosyn 2/27 >>> 3/5  Levaquin 3/1 >>> 3/4  Ceftriaxone 3/5 >>>3/14   HPI/Subjective: Patient awake. Reports some nausea and occasional emesis (due to her gastroparesis); no SOB or CP.  Objective: Blood pressure 156/76, pulse 95, temperature 99.8 F (37.7 C), temperature source Oral, resp. rate 19, height 5\' 7"  (1.702 m), weight 112 kg (246 lb 14.6 oz), last menstrual period 02/26/2013, SpO2 100.00%.  Intake/Output Summary (Last 24 hours) at 04/05/13 1732 Last data filed at 04/05/13 1521  Gross per 24 hour  Intake    120 ml  Output      0 ml  Net    120 ml     Exam: General: No acute respiratory distress-flat affect and crying spells Lungs: Clear to auscultation bilaterally without wheezes or crackles, 2 L Cardiovascular: Regular rate and rhythm without murmur  gallop or rub normal S1 and S2, no peripheral edema or JVD Abdomen: no tender, soft, positive  BS Musculoskeletal: No significant cyanosis, clubbing of bilateral lower extremities Neurological: Alert and oriented x 3, moves all extremities x 4 without focal neurological deficits, CN 2-12 intact  Scheduled Meds:  Scheduled Meds: . aspirin  81 mg Oral Daily  . atorvastatin  40 mg Oral q1800  . calcium acetate  667 mg Oral TID WC  . [START ON 04/06/2013] cefTRIAXone (ROCEPHIN)  IV  2 g Intravenous Q24H  . clonazePAM  0.5 mg Oral BID  . darbepoetin (ARANESP) injection - DIALYSIS  100 mcg Intravenous Q Tue-HD  . [START ON 04/07/2013] enoxaparin (LOVENOX) injection  30 mg Subcutaneous Q24H  . feeding supplement (NEPRO CARB STEADY)  237 mL Oral BID BM  . insulin aspart  0-20 Units Subcutaneous TID WC & HS  . insulin glargine  30 Units Subcutaneous Daily  . LORazepam      . metoCLOPramide (REGLAN) injection  5 mg Intravenous Q12H  . multivitamin  1 tablet Oral QHS  . ondansetron      . ondansetron (ZOFRAN) IV  4 mg Intravenous 4 times per day  . traZODone  50 mg Oral QHS   Continuous Infusions:   Data Reviewed: Basic Metabolic Panel:  Recent Labs Lab 03/30/13 0500 03/30/13 1600 03/31/13 0430 04/01/13 0500 04/02/13 0500 04/03/13 1202 04/04/13 0530  NA 136* 135* 136* 136* 139 136* 135*  K 3.8 3.9 3.8 4.1 3.8 3.7 3.9  CL 96 96 97 94* 97 94* 90*  CO2 23 26 23 24 22 22 22   GLUCOSE 205* 179* 164* 204* 169* 216* 214*  BUN 31* 37* 51* 43* 64* 76* 36*  CREATININE 2.01* 2.35* 3.30* 3.41* 4.72* 4.93* 4.13*  CALCIUM 9.5 9.7 9.9 9.4 9.6 9.2 8.9  MG 2.7*  --  3.1*  --   --   --   --   PHOS  --  4.6  --   --  7.1* 6.6*  --    Liver Function Tests:  Recent Labs Lab 03/30/13 1600 04/02/13 0500 04/03/13 1202  ALBUMIN 2.7* 2.8* 2.7*   CBC:  Recent Labs Lab 03/31/13 0430 04/01/13 0500 04/02/13 0500 04/03/13 1202 04/04/13 0008  WBC 16.9* 17.0* 21.3* 15.5* 19.5*  HGB 9.0* 9.5* 9.8* 9.2* 10.1*  HCT 27.5* 29.1* 30.2* 28.0* 31.4*  MCV 88.1 89.5 89.1 86.7 88.0  PLT 378 363  436* 362 454*   BNP (last 3 results)  Recent Labs  10/07/12 2126 11/01/12 1446 03/24/13 1200  PROBNP 1631.0* 2441.0* 6479.0*   CBG:  Recent Labs Lab 04/04/13 1322 04/04/13 1759 04/04/13 2005 04/05/13 1250 04/05/13 1657  GLUCAP 243* 186* 221* 256* 258*    Recent Results (from the past 240 hour(s))  CLOSTRIDIUM DIFFICILE BY PCR     Status: None   Collection Time    04/04/13  6:03 PM      Result Value Ref Range Status   C difficile by pcr NEGATIVE  NEGATIVE Final    Armen PickupManzueta, Jasmine [name] [phone]  04/05/2013, 5:32 PM   LOS: 13 days

## 2013-04-05 NOTE — Progress Notes (Signed)
IR PA aware of request for exchange from temporary HD catheter to tunneled HD catheter. Patient with wbc trending up and low grade temp, IR will proceed once patient afebrile 24 hours and wbc trending down.   Pattricia BossKoreen Oasis Goehring PA-C Interventional Radiology  04/05/13  2:07 PM

## 2013-04-05 NOTE — Progress Notes (Signed)
[facility] Progress Note  Subjective:   Talking about mother dying at age 43 from DM and the fact that she hasn't been taking care of herself.  No SOB. No urine output  Objective Filed Vitals:   04/05/13 0741 04/05/13 0744 04/05/13 0800 04/05/13 0830  BP: 136/72  182/73 162/70  Pulse: 112 115 111 113  Temp: 99.6 F (37.6 C)     TempSrc: Oral     Resp: 15     Height:      Weight: 111.9 kg (246 lb 11.1 oz)     SpO2: 100%      Physical Exam goal 500 General: Calm NAD on HD Heart: tachy regular Lungs: no wheezes or rales Abdomen: obese soft Extremities: Tr LE edema Dialysis Access: right I-J Qb 400  Assessment/Plan: 1 Acute on CRF- HD dependent, no sign of recovery yet; Oliguria Hd again today - labs pending; avoid low BP 2 CKD stage 3/4, baseline creat 1.8-2.1 - 4 hour tmt today - check am labs - decided on HD Friday or defer until Sat. 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 Restart BB given tachycardia 9. Nutrition - add multivitamin; on renal carb mod 10. Anxiety/depression - started klonopin and trazodone - needs counseling too after d/c  Sheffield Slider, PA-C [facility] Beeper [phone] 04/05/2013,8:54 AM  LOS: 13 days   Pt seen, examined and agree w A/P as above. Stable, no signs of kidney recovery yet.  Will ask IR to place tunneled cath.  Keep volume on high side while awaiting recovery. [name] MD pager [phone]    cell [phone] 04/05/2013, 11:32 AM    Additional Objective Labs: Basic Metabolic  Panel:  Recent Labs Lab 03/30/13 1600  04/02/13 0500 04/03/13 1202 04/04/13 0530  NA 135*  < > 139 136* 135*  K 3.9  < > 3.8 3.7 3.9  CL 96  < > 97 94* 90*  CO2 26  < > 22 22 22   GLUCOSE 179*  < > 169* 216* 214*  BUN 37*  < > 64* 76* 36*  CREATININE 2.35*  < > 4.72* 4.93* 4.13*  CALCIUM 9.7  < > 9.6 9.2 8.9  PHOS 4.6  --  7.1* 6.6*  --   < > = values in this interval not displayed. Liver Function Tests:  Recent Labs Lab 03/30/13 1600 04/02/13 0500 04/03/13 1202  ALBUMIN 2.7* 2.8* 2.7*   CBC:  Recent Labs Lab 03/31/13 0430 04/01/13 0500 04/02/13 0500 04/03/13 1202 04/04/13 0008  WBC 16.9* 17.0* 21.3* 15.5* 19.5*  HGB 9.0* 9.5* 9.8* 9.2* 10.1*  HCT 27.5* 29.1* 30.2* 28.0* 31.4*  MCV 88.1 89.5 89.1 86.7 88.0  PLT 378 363 436* 362 454*   Blood Culture    Component Value Date/Time   SDES STOOL 04/04/2013 1803   SPECREQUEST NONE 04/04/2013 1803   CULT  Value: FEW PROTEUS MIRABILIS Performed at [facility] 03/23/2013 1348   REPTSTATUS 04/05/2013 FINAL 04/04/2013 1803  CBG:  Recent Labs Lab 04/03/13 2140 04/04/13 0820 04/04/13 1322 04/04/13 1759 04/04/13 2005  GLUCAP 198* 238* 243* 186* 221*  Studies/Results: Dg Chest Port 1 View  04/04/2013   CLINICAL DATA Respiratory failure  EXAM PORTABLE CHEST - 1 VIEW  COMPARISON 04/01/2013  FINDINGS Endotracheal tube removed. NG tube removed. Right jugular catheter tip in the SVC unchanged. No pneumothorax.  Improved aeration in the lung bases. The lungs are clear. Negative for pneumonia or heart failure.  IMPRESSION Improved aeration of the lungs. Lungs are now clear following extubation  SIGNATURE  Electronically Signed   By: Marlan Palauharles  Clark M.D.   On: 04/04/2013 07:07   Medications:   . aspirin  81 mg Oral Daily  . atorvastatin  40 mg Oral q1800  . cefTRIAXone (ROCEPHIN)  IV  2 g Intravenous Q24H  . clonazePAM  0.5 mg Oral BID  . darbepoetin (ARANESP) injection - DIALYSIS  100 mcg Intravenous Q Tue-HD  .  enoxaparin (LOVENOX) injection  30 mg Subcutaneous Q24H  . feeding supplement (NEPRO CARB STEADY)  237 mL Oral BID BM  . insulin aspart  0-20 Units Subcutaneous TID WC & HS  . insulin glargine  30 Units Subcutaneous Daily  . metoCLOPramide (REGLAN) injection  5 mg Intravenous Q12H  . ondansetron (ZOFRAN) IV  4 mg Intravenous 4 times per day  . traZODone  50 mg Oral QHS

## 2013-04-05 NOTE — Evaluation (Signed)
Physical Therapy Evaluation Patient Details Name: Jasmine [name] MRN: [id number] DOB: 09/29/1970 Today's Date: 04/05/2013 Time: 1436-1500 PT Time Calculation (min): 24 min  PT Assessment / Plan / Recommendation History of Present Illness  Jasmine [name] is a 43 y.o. female with a past medical history of poorly controlled insulin-dependent diabetes mellitus, nonobstructive coronary artery disease per cardiac catheterization in 2010, hypertension, stage III chronic kidney disease, presented with elevated blood sugars and buttock abcess with elevated white count and abnormal bicarbonate levels.  Was intubated on 3/3 and [facility], extubated 3/8.  Clinical Impression  Patient presents with decreased independence with mobility due to deficits listed below (PT problem list).  She will benefit from skilled PT in the acute setting to allow return home independent at d/c.  Will need to have assist for safety on stairs for home entry (feels spouse can assist).    PT Assessment  Patient needs continued PT services    Follow Up Recommendations  Home health PT;Supervision/Assistance - 24 hour    Does the patient have the potential to tolerate intense rehabilitation    N/A  Barriers to Discharge Decreased caregiver support      Equipment Recommendations  Rolling walker with 5" wheels    Recommendations for Other Services OT consult   Frequency Min 3X/week    Precautions / Restrictions Precautions Precautions: Fall   Pertinent Vitals/Pain Min c/o buttock and LE pain       Mobility  Bed Mobility General bed mobility comments: NT patient up in chair Transfers Overall transfer level: Needs assistance Equipment used: Rolling walker (2 wheeled) Transfers: Sit to/from Stand Sit to Stand: Min guard General transfer comment: cues to use armrests Ambulation/Gait Ambulation/Gait assistance: Min guard;Supervision Ambulation Distance (Feet): 150 Feet Assistive device:  Rolling walker (2 wheeled) Gait Pattern/deviations: Step-through pattern;Drifts right/left;Wide base of support General Gait Details: min c/o leg pain with ambulation    Exercises     PT Diagnosis: Difficulty walking;Abnormality of gait;Generalized weakness;Acute pain  PT Problem List: Decreased strength;Decreased mobility;Decreased activity tolerance;Decreased balance;Decreased knowledge of use of DME;Decreased safety awareness;Pain PT Treatment Interventions: DME instruction;Therapeutic exercise;Gait training     PT Goals(Current goals can be found in the care plan section) Acute Rehab PT Goals Patient Stated Goal: To return to independent PT Goal Formulation: With patient Time For Goal Achievement: 04/19/13 Potential to Achieve Goals: Good  Visit Information  Last PT Received On: 04/05/13 Assistance Needed: +1 History of Present Illness: Jasmine [name] is a 43 y.o. female with a past medical history of poorly controlled insulin-dependent diabetes mellitus, nonobstructive coronary artery disease per cardiac catheterization in 2010, hypertension, stage III chronic kidney disease, presented with elevated blood sugars and buttock abcess with elevated white count and abnormal bicarbonate levels.  Was intubated on 3/3 and [facility], extubated 3/8.       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 Communication Communication: No difficulties Dominant Hand: Right    Cognition  Cognition Arousal/Alertness: Awake/alert Behavior During Therapy: WFL for tasks assessed/performed Overall Cognitive Status: Within Functional Limits for tasks assessed    Extremity/Trunk Assessment Upper Extremity Assessment Upper Extremity Assessment: Generalized weakness Lower Extremity Assessment Lower Extremity Assessment: Generalized weakness   Balance Balance Overall balance assessment: Needs assistance Standing balance support: Bilateral upper extremity supported Standing balance-Leahy Scale: Fair Standing balance comment: able to stand unsupported, but UE assist needed for ambulation  End of Session PT - End of Session Equipment Utilized During Treatment: Gait belt Activity Tolerance: Patient tolerated treatment well Patient left: in chair;with call bell/phone within reach  GP     [facility] 04/05/2013, 3:35 PM [place], [place] [phone] 04/05/2013

## 2013-04-06 LAB — URINALYSIS, ROUTINE W REFLEX MICROSCOPIC
GLUCOSE, UA: 100 mg/dL — AB
KETONES UR: 15 mg/dL — AB
Nitrite: POSITIVE — AB
PH: 5 (ref 5.0–8.0)
Protein, ur: 100 mg/dL — AB
SPECIFIC GRAVITY, URINE: 1.028 (ref 1.005–1.030)
Urobilinogen, UA: 0.2 mg/dL (ref 0.0–1.0)

## 2013-04-06 LAB — URINE MICROSCOPIC-ADD ON

## 2013-04-06 LAB — RENAL FUNCTION PANEL
Albumin: 2.9 g/dL — ABNORMAL LOW (ref 3.5–5.2)
BUN: 23 mg/dL (ref 6–23)
CO2: 23 mEq/L (ref 19–32)
Calcium: 9.4 mg/dL (ref 8.4–10.5)
Chloride: 94 mEq/L — ABNORMAL LOW (ref 96–112)
Creatinine, Ser: 4.49 mg/dL — ABNORMAL HIGH (ref 0.50–1.10)
GFR calc Af Amer: 13 mL/min — ABNORMAL LOW (ref >90)
GFR calc non Af Amer: 11 mL/min — ABNORMAL LOW (ref >90)
Glucose, Bld: 267 mg/dL — ABNORMAL HIGH (ref 70–99)
Phosphorus: 2.8 mg/dL (ref 2.3–4.6)
Potassium: 4.1 mEq/L (ref 3.7–5.3)
Sodium: 138 mEq/L (ref 137–147)

## 2013-04-06 LAB — PARATHYROID HORMONE, INTACT (NO CA): PTH: 222.1 pg/mL — ABNORMAL HIGH (ref 14.0–72.0)

## 2013-04-06 LAB — GLUCOSE, CAPILLARY
Glucose-Capillary: 212 mg/dL — ABNORMAL HIGH (ref 70–99)
Glucose-Capillary: 221 mg/dL — ABNORMAL HIGH (ref 70–99)
Glucose-Capillary: 270 mg/dL — ABNORMAL HIGH (ref 70–99)
Glucose-Capillary: 285 mg/dL — ABNORMAL HIGH (ref 70–99)

## 2013-04-06 MED ORDER — SACCHAROMYCES BOULARDII 250 MG PO CAPS
250.0000 mg | ORAL_CAPSULE | Freq: Two times a day (BID) | ORAL | Status: DC
  Administered 2013-04-06 – 2013-04-13 (×15): 250 mg via ORAL
  Filled 2013-04-06 (×17): qty 1

## 2013-04-06 MED ORDER — COLLAGENASE 250 UNIT/GM EX OINT
TOPICAL_OINTMENT | Freq: Every day | CUTANEOUS | Status: DC
  Administered 2013-04-06 – 2013-04-13 (×8): via TOPICAL
  Filled 2013-04-06: qty 30

## 2013-04-06 MED ORDER — INSULIN GLARGINE 100 UNIT/ML ~~LOC~~ SOLN
35.0000 [IU] | Freq: Every day | SUBCUTANEOUS | Status: DC
  Administered 2013-04-06 – 2013-04-07 (×2): 35 [IU] via SUBCUTANEOUS
  Filled 2013-04-06 (×2): qty 0.35

## 2013-04-06 NOTE — Progress Notes (Signed)
  [facility] Progress Note   Subjective: "I peed today", normal amounts per pt.  Has been just producing minimal amounts with each void until today.  No new complaints.  Filed Vitals:   04/05/13 2137 04/06/13 0454 04/06/13 1000 04/06/13 1354  BP: 145/70 151/61 140/59 146/62  Pulse: 107 101 102 101  Temp: 100.3 F (37.9 C) 99.7 F (37.6 C) 98.8 F (37.1 C) 98 F (36.7 C)  TempSrc: Oral Oral Oral Oral  Resp: 17 18 18 18   Height:      Weight: 249 lb 8 oz (113.172 kg)     SpO2: 99% 100% 100% 100%   Exam: Gen: calm NAD on HD Heart: tachy regular Lungs: no wheezes or rales Abdomen: obese soft Extremities: Tr LE edema Dialysis Access: right I-J Qb 400  Assessment/Plan: 1 Acute on CRF- making urine now first time today > I have cancelled tunneled cath appt and will follow creat over weekend without dialysis, hopefully recovering now 2 CKD stage 3/4, baseline creat 1.8-2.1 3 DM w multiple complications; BS 186 - 200s 4 Pulm edema- resolved 5 L buttock abcess s/p I&D 6 Anemia on darbe 100/wk 8  HTN was on 3 BP meds + 2 diuretics at home > none here  Vinson Moselleob Destyne Goodreau MD (pgr) [phone]    (c) [phone] 04/06/2013, 3:11 PM     Recent Labs Lab 04/02/13 0500 04/03/13 1202 04/04/13 0530 04/06/13 0900  NA 139 136* 135* 138  K 3.8 3.7 3.9 4.1  CL 97 94* 90* 94*  CO2 22 22 22 23   GLUCOSE 169* 216* 214* 267*  BUN 64* 76* 36* 23  CREATININE 4.72* 4.93* 4.13* 4.49*  CALCIUM 9.6 9.2 8.9 9.4  PHOS 7.1* 6.6*  --  2.8    Recent Labs Lab 04/02/13 0500 04/03/13 1202 04/06/13 0900  ALBUMIN 2.8* 2.7* 2.9*    Recent Labs Lab 04/02/13 0500 04/03/13 1202 04/04/13 0008  WBC 21.3* 15.5* 19.5*  HGB 9.8* 9.2* 10.1*  HCT 30.2* 28.0* 31.4*  MCV 89.1 86.7 88.0  PLT 436* 362 454*   . aspirin  81 mg Oral Daily  . atorvastatin  40 mg Oral q1800  . calcium acetate  667 mg Oral TID WC  . cefTRIAXone (ROCEPHIN)  IV  2 g Intravenous Q24H  . clonazePAM  0.5 mg Oral BID   . collagenase   Topical Daily  . darbepoetin (ARANESP) injection - DIALYSIS  100 mcg Intravenous Q Tue-HD  . [START ON 04/07/2013] enoxaparin (LOVENOX) injection  30 mg Subcutaneous Q24H  . feeding supplement (NEPRO CARB STEADY)  237 mL Oral BID BM  . insulin aspart  0-20 Units Subcutaneous TID WC & HS  . insulin glargine  35 Units Subcutaneous Daily  . metoCLOPramide (REGLAN) injection  5 mg Intravenous Q12H  . multivitamin  1 tablet Oral QHS  . ondansetron (ZOFRAN) IV  4 mg Intravenous 4 times per day  . saccharomyces boulardii  250 mg Oral BID  . traZODone  50 mg Oral QHS     acetaminophen, albuterol, alum & mag hydroxide-simeth, feeding supplement (NEPRO CARB STEADY), heparin, heparin, LORazepam, metoprolol, morphine injection, nitroGLYCERIN, ondansetron (ZOFRAN) IV, zolpidem

## 2013-04-06 NOTE — Progress Notes (Signed)
I have seen and examined the patient and agree with the assessment and plans.  Savior Himebaugh A. Zavian Slowey  MD, FACS  

## 2013-04-06 NOTE — Progress Notes (Signed)
Patient ID: Jasmine [name], female   DOB: [birth year], 43 y.o.   MRN: [id number]    Subjective: Pt feels ok.  Some pain in her backside  Objective: Vital signs in last 24 hours: Temp:  [98.8 F (37.1 C)-100.3 F (37.9 C)] 98.8 F (37.1 C) (03/13 1000) Pulse Rate:  [95-110] 102 (03/13 1000) Resp:  [17-20] 18 (03/13 1000) BP: (140-169)/(59-86) 140/59 mmHg (03/13 1000) SpO2:  [98 %-100 %] 100 % (03/13 1000) Weight:  [246 lb 14.6 oz (112 kg)-249 lb 8 oz (113.172 kg)] 249 lb 8 oz (113.172 kg) (03/12 2137) Last BM Date: 04/05/13  Intake/Output from previous day: 03/12 0701 - 03/13 0700 In: 240 [P.O.:240] Out: 0  Intake/Output this shift:    PE: Skin: purulent drainage from buttock wound.  Area of frank necrosis surrounding this wound.  Lab Results:   Recent Labs  04/03/13 1202 04/04/13 0008  WBC 15.5* 19.5*  HGB 9.2* 10.1*  HCT 28.0* 31.4*  PLT 362 454*   BMET  Recent Labs  04/04/13 0530 04/06/13 0900  NA 135* 138  K 3.9 4.1  CL 90* 94*  CO2 22 23  GLUCOSE 214* 267*  BUN 36* 23  CREATININE 4.13* 4.49*  CALCIUM 8.9 9.4   PT/INR No results found for this basename: LABPROT, INR,  in the last 72 hours CMP     Component Value Date/Time   NA 138 04/06/2013 0900   K 4.1 04/06/2013 0900   CL 94* 04/06/2013 0900   CO2 23 04/06/2013 0900   GLUCOSE 267* 04/06/2013 0900   BUN 23 04/06/2013 0900   CREATININE 4.49* 04/06/2013 0900   CALCIUM 9.4 04/06/2013 0900   PROT 6.9 10/11/2012 0400   ALBUMIN 2.9* 04/06/2013 0900   AST 12 10/11/2012 0400   ALT 11 10/11/2012 0400   ALKPHOS 117 10/11/2012 0400   BILITOT 0.2* 10/11/2012 0400   GFRNONAA 11* 04/06/2013 0900   GFRAA 13* 04/06/2013 0900   Lipase     Component Value Date/Time   LIPASE 23 08/05/2009 1323       Studies/Results: No results found.  Anti-infectives: Anti-infectives   Start     Dose/Rate Route Frequency Ordered Stop   04/06/13 1600  cefTRIAXone (ROCEPHIN) 2 g in dextrose 5 % 50 mL IVPB     2 g 100 mL/hr  over 30 Minutes Intravenous Every 24 hours 04/05/13 1730 04/08/13 1559   04/03/13 1500  cefTRIAXone (ROCEPHIN) 2 g in dextrose 5 % 50 mL IVPB     2 g 100 mL/hr over 30 Minutes Intravenous Every 24 hours 04/03/13 1023 04/05/13 1641   03/29/13 1500  cefTRIAXone (ROCEPHIN) 2 g in dextrose 5 % 50 mL IVPB  Status:  Discontinued     2 g 100 mL/hr over 30 Minutes Intravenous Every 24 hours 03/29/13 1439 04/03/13 1023   03/28/13 2100  vancomycin (VANCOCIN) 1,250 mg in sodium chloride 0.9 % 250 mL IVPB  Status:  Discontinued     1,250 mg 166.7 mL/hr over 90 Minutes Intravenous Every 24 hours 03/28/13 1120 03/28/13 1125   03/28/13 2000  Levofloxacin (LEVAQUIN) IVPB 250 mg  Status:  Discontinued     250 mg 50 mL/hr over 60 Minutes Intravenous Every 24 hours 03/28/13 1118 03/28/13 1125   03/27/13 2000  vancomycin (VANCOCIN) 1,750 mg in sodium chloride 0.9 % 500 mL IVPB  Status:  Discontinued     1,750 mg 250 mL/hr over 120 Minutes Intravenous Every 48 hours 03/26/13 0931 03/28/13 1120  03/25/13 1800  levofloxacin (LEVAQUIN) IVPB 750 mg  Status:  Discontinued     750 mg 100 mL/hr over 90 Minutes Intravenous Every 48 hours 03/25/13 1625 03/28/13 1116   03/24/13 2000  vancomycin (VANCOCIN) 1,750 mg in sodium chloride 0.9 % 500 mL IVPB  Status:  Discontinued     1,750 mg 250 mL/hr over 120 Minutes Intravenous Every 24 hours 03/23/13 1903 03/26/13 0931   03/23/13 2000  piperacillin-tazobactam (ZOSYN) IVPB 3.375 g  Status:  Discontinued     3.375 g 12.5 mL/hr over 240 Minutes Intravenous 3 times per day 03/23/13 1903 03/28/13 1123   03/23/13 2000  vancomycin (VANCOCIN) 2,000 mg in sodium chloride 0.9 % 500 mL IVPB     2,000 mg 250 mL/hr over 120 Minutes Intravenous  Once 03/23/13 1903 03/24/13 0005   03/23/13 1345  clindamycin (CLEOCIN) IVPB 600 mg     600 mg 100 mL/hr over 30 Minutes Intravenous  Once 03/23/13 1338 03/23/13 1514       Assessment/Plan  1. Buttock abscess, s/p I&D by EDP now with  surrounding pressure wound  Plan: 1. Cont to pack wound and start NS WD dressing changes to necrotic pressure wound BID.  Will add santyl and PT hydrotherapy to start debriding this wound and cleaning everything up.  Prn for this weekend.  We will resee on Monday.   LOS: 14 days    Mckade Gurka E 04/06/2013, 10:04 AM Pager: [phone]

## 2013-04-06 NOTE — Progress Notes (Signed)
Progress Note  Jasmine [name] ZOX:[id number] DOB: [birth year] DOA: 03/23/2013 PCP: Dois Davenport., MD  Brief narrative: 43 yo with poorly controlled insulin-dependent diabetes mellitus admitted 2/27 for L buttock abscess, sepsis and DKA. Intubated for hypoxemic respiratory failure.Buttock abscess was I/D per surgery. CT abdomen revealed airspace consolidation R > L with air bronchograms suggesting multifocal PNA. Also small bilat effusions and low lung volumes. Txd with Vanco and Zosyn. Cx found positive for strep. Sx's of volume overload persisted so Nephro consulted- initially used high dose IV Lasix but limited response and progressive renal failure. Was transferred to [facility] for CRRT. Stabilized and was able to be extubated. Remains on HD and possibly this will become chronic but still awaiting renal recovery. Pt depressed over current health status so psychiatry consulted  Assessment/Plan: DKA (diabetic ketoacidoses)/DIABETES MELLITUS, TYPE I -resolved  -CBG's has remained in the 200's-220 range -continue lantus (but will increase dose to 35 units) and SSI  Acute-on-chronic kidney injury/ CKD (chronic kidney disease), stage IV -per Nephrology -cont HD for now (next treatment on 3/14) -hopping for renal recovery -patient anuric/oliguric currently  Sepsis -due to PNA and buttock abscess -sepsis physiology has resolved -cont anbx's to complete therapy -continue wound care as indicated by surgery service -still with some low grade fevers; no focal etiology appreciated at this moment -will monitor  Acute respiratory failure with hypoxia: A) Volume Overload B) Strep PNA C) Heart failure, diastolic, chronic D) OSA (obstructive sleep apnea) -PNA initially then volume overload from volume resuscitation in setting of ARF -patient approx 13-14 L off since HD started; plan is to maintain current volume while waiting for recovery -continue CPAP -primary tx at this point is  HD  Diarrhea -has leukocytosis and low grade fevers -C. Diff neg -continue rocephin -associated to side effects of abx's; will continue probiotic   Hypertension and volume -controlled with HD  Gastroparesis -significant nausea explained by gastroparesis and uremia -also could be 2/2 cause of diarrhea -continue IV Reglan- dose adjusted by pharmacy for CrCl -continue scheduled Zofran  Left buttock abscess/ Abscess, perirectal s/p I&D 03/23/2013 -per CCS -cont current wound care -Wound culture positive for Proteus -will complete a total of 7 days of ceftriaxone given slight elevation on CBC's and low grade temp -CBC in am  DEPRESSION and anxiety -Psych has seen patient and recommended klonopin 0.5 mg BID and trazodone 50mg  QHS -no SI  Anemia, chronic disease -cont Aranesp -hgb stable -has received 1 unit PRBCs during this admission  -CBC in am  DVT prophylaxis: Lovenox Code Status: Full code Family Communication: No family at bedside Disposition Plan/Expected LOS: remained inpatient  Consultants: PCCM Nephrology Psychiatry General surgery IR  Procedures: 2/27 incision and drainage left buttock abscess  3/3 right IJ hemodialysis catheter  CULTURES:  2/27 MRCA PCR >>> neg  2/27 Blood >>> neg  2/27 Wound >>> PROTEUS  Antibiotics: Vancomycin 2/27 >>> 3/4  Zosyn 2/27 >>> 3/5  Levaquin 3/1 >>> 3/4  Ceftriaxone 3/5 >>>3/14   HPI/Subjective: Patient denies CP or SOB. Reports still with soft BM's and intermittent episodes of nausea/vomiting. Low grade fever overnight.  Objective: Blood pressure 151/61, pulse 101, temperature 99.7 F (37.6 C), temperature source Oral, resp. rate 18, height 5\' 7"  (1.702 m), weight 113.172 kg (249 lb 8 oz), last menstrual period 02/26/2013, SpO2 100.00%.  Intake/Output Summary (Last 24 hours) at 04/06/13 0811 Last data filed at 04/05/13 2300  Gross per 24 hour  Intake    240 ml  Output  0 ml  Net    240 ml      Exam: General: No acute respiratory distress-flat affect; low grade fever Lungs: Clear to auscultation bilaterally without wheezes or crackles, good O2 sat on RA Cardiovascular: Regular rate and rhythm without murmur gallop or rub normal S1 and S2, no peripheral edema or JVD Abdomen: no tender, soft, positive BS Musculoskeletal: No significant cyanosis, clubbing of bilateral lower extremities Neurological: Alert and oriented x 3, moves all extremities x 4 without focal neurological deficits, CN 2-12 intact  Scheduled Meds:  Scheduled Meds: . aspirin  81 mg Oral Daily  . atorvastatin  40 mg Oral q1800  . calcium acetate  667 mg Oral TID WC  . cefTRIAXone (ROCEPHIN)  IV  2 g Intravenous Q24H  . clonazePAM  0.5 mg Oral BID  . darbepoetin (ARANESP) injection - DIALYSIS  100 mcg Intravenous Q Tue-HD  . [START ON 04/07/2013] enoxaparin (LOVENOX) injection  30 mg Subcutaneous Q24H  . feeding supplement (NEPRO CARB STEADY)  237 mL Oral BID BM  . insulin aspart  0-20 Units Subcutaneous TID WC & HS  . insulin glargine  30 Units Subcutaneous Daily  . metoCLOPramide (REGLAN) injection  5 mg Intravenous Q12H  . multivitamin  1 tablet Oral QHS  . ondansetron (ZOFRAN) IV  4 mg Intravenous 4 times per day  . saccharomyces boulardii  250 mg Oral BID  . traZODone  50 mg Oral QHS   Continuous Infusions:   Data Reviewed: Basic Metabolic Panel:  Recent Labs Lab 03/30/13 1600 03/31/13 0430 04/01/13 0500 04/02/13 0500 04/03/13 1202 04/04/13 0530  NA 135* 136* 136* 139 136* 135*  K 3.9 3.8 4.1 3.8 3.7 3.9  CL 96 97 94* 97 94* 90*  CO2 26 23 24 22 22 22   GLUCOSE 179* 164* 204* 169* 216* 214*  BUN 37* 51* 43* 64* 76* 36*  CREATININE 2.35* 3.30* 3.41* 4.72* 4.93* 4.13*  CALCIUM 9.7 9.9 9.4 9.6 9.2 8.9  MG  --  3.1*  --   --   --   --   PHOS 4.6  --   --  7.1* 6.6*  --    Liver Function Tests:  Recent Labs Lab 03/30/13 1600 04/02/13 0500 04/03/13 1202  ALBUMIN 2.7* 2.8* 2.7*    CBC:  Recent Labs Lab 03/31/13 0430 04/01/13 0500 04/02/13 0500 04/03/13 1202 04/04/13 0008  WBC 16.9* 17.0* 21.3* 15.5* 19.5*  HGB 9.0* 9.5* 9.8* 9.2* 10.1*  HCT 27.5* 29.1* 30.2* 28.0* 31.4*  MCV 88.1 89.5 89.1 86.7 88.0  PLT 378 363 436* 362 454*   BNP (last 3 results)  Recent Labs  10/07/12 2126 11/01/12 1446 03/24/13 1200  PROBNP 1631.0* 2441.0* 6479.0*   CBG:  Recent Labs Lab 04/04/13 1759 04/04/13 2005 04/05/13 1250 04/05/13 1657 04/05/13 2132  GLUCAP 186* 221* 256* 258* 214*    Recent Results (from the past 240 hour(s))  CLOSTRIDIUM DIFFICILE BY PCR     Status: None   Collection Time    04/04/13  6:03 PM      Result Value Ref Range Status   C difficile by pcr NEGATIVE  NEGATIVE Final    Armen PickupManzueta, Abrar Koone E Azim Gillingham [phone]  04/06/2013, 8:11 AM   LOS: 14 days

## 2013-04-06 NOTE — Progress Notes (Signed)
Physical Therapy Wound Evaluation and Treatment Patient Details  Name: Jasmine [name] MRN: [id number] Date of Birth: [birth year]  Today's Date: 04/06/2013 Time: 7893-8101 Time Calculation (min): 28 min  Subjective  Subjective: reports she has problems sitting or lying comfortably Patient and Family Stated Goals: heal wound Date of Onset: 03/19/13 Prior Treatments: bedside I&D by surgery; NS wet to dry  Pain Score: Pain Score: 3/10 pre- and post-hydrotherapy; 6/10 during session; RN provided medication to assist with pain control 45 min prior to session (pt would like this repeated for next session)  Wound Assessment   Clinical Statement: Pt adm with Lt buttock abscess, sepsis, required intubation. Had I&D in OR and wound initially felt better (per pt), however over the past week has been gradually feeling worse. Wound bed 85% necrotic with tract/undermining of 2.6 cm and draining purulent drainage. Feel pt would benefit from surgical debridement to open up area to better drain and promote healing. Hydrotherapy will assist with decreasing necrotic tissue,however at a slower rate.  Wound / Incision (Open or Dehisced) 03/23/13 Other (Comment) Buttocks Left abscess, I&D performed today (Active)  Dressing Type ABD;Barrier Film (skin prep);Gauze (Comment);Moist to dry;Tape dressing;Other (Comment) Santyl 04/06/2013  3:18 PM  Dressing Changed Changed 04/06/2013  3:18 PM  Dressing Status Clean;Intact 04/06/2013  3:18 PM  Dressing Change Frequency Twice a day 04/06/2013  3:18 PM  Site / Wound Assessment Brown;Granulation tissue;Painful;Yellow 04/06/2013  3:18 PM  % Wound base Red or Granulating 5% 04/06/2013  3:18 PM  % Wound base Yellow 10% 04/06/2013  3:18 PM  % Wound base Black 85% 04/06/2013  3:18 PM  % Wound base Other (Comment) 100% 03/26/2013  1:00 PM  Peri-wound Assessment Intact;Edema;Erythema (blanchable);Induration 04/06/2013  3:18 PM  Wound Length (cm) 5.5 cm 04/06/2013  3:18 PM  Wound Width (cm)  4.4 cm 04/06/2013  3:18 PM  Wound Depth (cm) 2.2 cm 04/06/2013  3:18 PM  Undermining (cm) @ 8:00 2.6 cm 04/06/2013  3:18 PM  Margins Unattached edges (unapproximated) 04/06/2013  3:18 PM  Closure None 04/06/2013  3:18 PM  Drainage Amount Copious 04/06/2013  3:18 PM  Drainage Description Purulent 04/06/2013  3:18 PM  Non-staged Wound Description Not applicable 7/51/0258  5:27 PM  Treatment Debridement (Selective);Hydrotherapy (Pulse lavage);Packing (Impregnated strip);Tape changed 04/06/2013  3:18 PM   Hydrotherapy Pulsed lavage therapy - wound location: Lt buttock Pulsed Lavage with Suction (psi): 8 psi Pulsed Lavage with Suction - Normal Saline Used: 1000 mL Pulsed Lavage Tip: Tip with splash shield Selective Debridement Selective Debridement - Location: Lt buttock Selective Debridement - Tools Used: Forceps;Scalpel;Scissors Selective Debridement - Tissue Removed: black/brown necrotic tissue; also scored   Wound Assessment and Plan  Wound Therapy - Assess/Plan/Recommendations Wound Therapy - Clinical Statement: Pt adm with Lt buttock abscess, sepsis, required intubation. Had I&D in OR and wound initially felt better (per pt), however over the past week has been gradually feeling worse. Wound bed 85% necrotic with tract/undermining of 2.6 cm and draining purulent drainage. Feel pt would benefit from surgical debridement to open up area to better drain and promote healing. Hydrotherapy will assist with decreasing necrotic tissue,however at a slower rate. Wound Therapy - Functional Problem List: limited positioning and mobility due to pain and drainage Factors Delaying/Impairing Wound Healing: Diabetes Mellitus;Infection - systemic/local;Immobility;Multiple medical problems Hydrotherapy Plan: Debridement;Dressing change;Patient/family education;Pulsatile lavage with suction Wound Therapy - Frequency: 6X / week Wound Therapy - Current Recommendations: Diabetic teaching Wound Therapy - Follow Up  Recommendations: Home health RN Wound Plan:  see above  Wound Therapy Goals- Improve the function of patient's integumentary system by progressing the wound(s) through the phases of wound healing (inflammation - proliferation - remodeling) by: Decrease Necrotic Tissue to: 60 Decrease Necrotic Tissue - Progress: Goal set today Increase Granulation Tissue to: 40 Increase Granulation Tissue - Progress: Goal set today Improve Drainage Characteristics: Mod Improve Drainage Characteristics - Progress: Goal set today Patient/Family will be able to : verbalize appropriate positioning and pressure relief to promote wound healing. Patient/Family Instruction Goal - Progress: Goal set today Goals/treatment plan/discharge plan were made with and agreed upon by patient/family: Yes Time For Goal Achievement: 7 days Wound Therapy - Potential for Goals: Good  Goals will be updated until maximal potential achieved or discharge criteria met.  Discharge criteria: when goals achieved, discharge from hospital, MD decision/surgical intervention, no progress towards goals, refusal/missing three consecutive treatments without notification or medical reason.  GP     Jasmine [name] 04/06/2013, 3:40 PM

## 2013-04-07 LAB — GLUCOSE, CAPILLARY
GLUCOSE-CAPILLARY: 223 mg/dL — AB (ref 70–99)
GLUCOSE-CAPILLARY: 307 mg/dL — AB (ref 70–99)
GLUCOSE-CAPILLARY: 168 mg/dL — AB (ref 70–99)
Glucose-Capillary: 211 mg/dL — ABNORMAL HIGH (ref 70–99)

## 2013-04-07 LAB — BASIC METABOLIC PANEL
BUN: 38 mg/dL — ABNORMAL HIGH (ref 6–23)
CO2: 28 mEq/L (ref 19–32)
CREATININE: 6.08 mg/dL — AB (ref 0.50–1.10)
Calcium: 9.3 mg/dL (ref 8.4–10.5)
Chloride: 95 mEq/L — ABNORMAL LOW (ref 96–112)
GFR, EST AFRICAN AMERICAN: 9 mL/min — AB (ref >90)
GFR, EST NON AFRICAN AMERICAN: 8 mL/min — AB (ref >90)
Glucose, Bld: 219 mg/dL — ABNORMAL HIGH (ref 70–99)
Potassium: 4.3 mEq/L (ref 3.7–5.3)
Sodium: 137 mEq/L (ref 137–147)

## 2013-04-07 LAB — CBC
HCT: 32.1 % — ABNORMAL LOW (ref 36.0–46.0)
HEMOGLOBIN: 10 g/dL — AB (ref 12.0–15.0)
MCH: 28.6 pg (ref 26.0–34.0)
MCHC: 31.2 g/dL (ref 30.0–36.0)
MCV: 91.7 fL (ref 78.0–100.0)
Platelets: 417 10*3/uL — ABNORMAL HIGH (ref 150–400)
RBC: 3.5 MIL/uL — AB (ref 3.87–5.11)
RDW: 14.2 % (ref 11.5–15.5)
WBC: 10.9 10*3/uL — AB (ref 4.0–10.5)

## 2013-04-07 MED ORDER — INSULIN GLARGINE 100 UNIT/ML ~~LOC~~ SOLN
45.0000 [IU] | Freq: Every day | SUBCUTANEOUS | Status: DC
  Administered 2013-04-08: 45 [IU] via SUBCUTANEOUS
  Filled 2013-04-07: qty 0.45

## 2013-04-07 MED ORDER — INSULIN ASPART 100 UNIT/ML ~~LOC~~ SOLN
4.0000 [IU] | Freq: Three times a day (TID) | SUBCUTANEOUS | Status: DC
  Administered 2013-04-07 – 2013-04-13 (×19): 4 [IU] via SUBCUTANEOUS

## 2013-04-07 NOTE — Progress Notes (Signed)
Physical Therapy Wound Treatment Patient Details  Name: Jasmine [name] MRN: [id number] Date of Birth: [birth year]  Today's Date: 04/07/2013 Time: 7517-0017 Time Calculation (min): 26 min  Subjective  Subjective: Reports pain meds are keeping pain under control (premedicated).  Pain Score: Pain Score: 3   Wound Assessment  Wound / Incision (Open or Dehisced) 03/23/13 Other (Comment) Buttocks Left abscess, I&D performed today (Active)  Dressing Type ABD;Barrier Film (skin prep);Gauze (Comment);Moist to dry;Tape dressing;Other (Comment) 04/07/2013  2:30 PM  Dressing Changed Changed 04/07/2013  2:30 PM  Dressing Status Clean;Dry;Intact 04/07/2013  2:30 PM  Dressing Change Frequency Twice a day 04/07/2013  2:30 PM  Site / Wound Assessment Brown;Yellow;Pink;Granulation tissue;Painful 04/07/2013  2:30 PM  % Wound base Red or Granulating 5% 04/07/2013  2:30 PM  % Wound base Yellow 10% 04/07/2013  2:30 PM  % Wound base Black 85% 04/07/2013  2:30 PM  Peri-wound Assessment Intact;Edema;Erythema (blanchable) 04/07/2013  2:30 PM  Undermining (cm) opening near patient's midline 04/07/2013  2:30 PM  Margins Unattached edges (unapproximated) 04/07/2013  2:30 PM  Closure None 04/07/2013  2:30 PM  Drainage Amount Moderate 04/07/2013  2:30 PM  Drainage Description Purulent;No odor 04/07/2013  2:30 PM  Treatment Debridement (Selective);Hydrotherapy (Pulse lavage);Packing (Impregnated strip);Tape changed 04/07/2013  2:30 PM   Hydrotherapy Pulsed lavage therapy - wound location: Lt buttock Pulsed Lavage with Suction (psi): 8 psi Pulsed Lavage with Suction - Normal Saline Used: 1000 mL Pulsed Lavage Tip: Tip with splash shield Selective Debridement Selective Debridement - Location: Lt buttock Selective Debridement - Tools Used: Forceps;Scissors Selective Debridement - Tissue Removed: brown/yellow necrotic tissue   Wound Assessment and Plan  Wound Therapy - Assess/Plan/Recommendations Wound Therapy - Clinical  Statement: Eschar loosening.  Agree with need for surgical debridement to open area to better drain and promote healing. Wound Therapy - Functional Problem List: limited positioning and mobility due to pain and drainage Factors Delaying/Impairing Wound Healing: Diabetes Mellitus;Infection - systemic/local;Immobility;Multiple medical problems Hydrotherapy Plan: Debridement;Dressing change;Patient/family education;Pulsatile lavage with suction Wound Therapy - Frequency: 6X / week Wound Therapy - Current Recommendations: Diabetic teaching Wound Therapy - Follow Up Recommendations: Home health RN Wound Plan: see above  Wound Therapy Goals- Improve the function of patient's integumentary system by progressing the wound(s) through the phases of wound healing (inflammation - proliferation - remodeling) by: Decrease Necrotic Tissue - Progress: Progressing toward goal Increase Granulation Tissue - Progress: Progressing toward goal Improve Drainage Characteristics - Progress: Progressing toward goal Patient/Family Instruction Goal - Progress: Progressing toward goal  Goals will be updated until maximal potential achieved or discharge criteria met.  Discharge criteria: when goals achieved, discharge from hospital, MD decision/surgical intervention, no progress towards goals, refusal/missing three consecutive treatments without notification or medical reason.  GP     Despina Pole 04/07/2013, 2:41 PM Carita Pian. Sanjuana Kava, Max Pager [phone]

## 2013-04-07 NOTE — Progress Notes (Signed)
Progress Note  Jasmine [name] ZOX:[id number] DOB: 02/12/1970 DOA: 03/23/2013 PCP: Dois DavenportICHTER,KAREN L., MD  Brief narrative79: 42 yo with poorly controlled insulin-dependent diabetes mellitus admitted 2/27 for L buttock abscess, sepsis and DKA. Intubated for hypoxemic respiratory failure.Buttock abscess was I/D per surgery. CT abdomen revealed airspace consolidation R > L with air bronchograms suggesting multifocal PNA. Also small bilat effusions and low lung volumes. Txd with Vanco and Zosyn. Cx found positive for strep. Sx's of volume overload persisted so Nephro consulted- initially used high dose IV Lasix but limited response and progressive renal failure. [facility]. Stabilized and was able to be extubated. Remains on HD and possibly this will become chronic but still awaiting renal recovery. Pt depressed over current health status so psychiatry consulted  Assessment/Plan: DKA (diabetic ketoacidoses)/DIABETES MELLITUS, TYPE I -resolved  -CBG's has remained in the 220's-300 range -continue lantus (but will increase dose to 45 units), SSI and meal coverage (patient eating better)  Acute-on-chronic kidney injury/ CKD (chronic kidney disease), stage IV -per Nephrology -patient has had small amount of urine output -will hold HD over the weekend and follow labs -hopping for renal recovery -patient remains oliguric currently  Sepsis -due to PNA and buttock abscess -sepsis physiology has resolved -cont anbx's to complete therapy -continue wound care as indicated by surgery service -still with some low grade fevers; no focal etiology appreciated at this moment -will monitor  Acute respiratory failure with hypoxia: A) Volume Overload B) Strep PNA C) Heart failure, diastolic, chronic D) OSA (obstructive sleep apnea) -PNA initially then volume overload from volume resuscitation in setting of ARF -patient approx 13-14 L off since HD started; plan is to maintain current  volume while waiting for recovery -continue CPAP -primary tx at this point is HD  Diarrhea -has leukocytosis and low grade fevers -C. Diff neg -continue rocephin -associated to side effects of abx's; will continue probiotic   Hypertension and volume -will be permissive with HTN during kidney recovery -BP and volume was controlled with HD  Gastroparesis -significant nausea explained by gastroparesis and uremia -also could be 2/2 cause of diarrhea -continue IV Reglan- dose adjusted by pharmacy for CrCl -continue scheduled Zofran  Left buttock abscess/ Abscess, perirectal s/p I&D 03/23/2013 -per [facility]CS -cont current wound care and hydrotherapy by PT -Wound culture positive for Proteus -will complete a total of 7 days of ceftriaxone given slight elevation on CBC's and low grade temp -CBC showing WBC's trending down  DEPRESSION and anxiety -Psych has seen patient and recommended klonopin 0.5 mg BID and trazodone 50mg  QHS -no SI  Anemia, chronic disease -cont Aranesp -has received 1 unit PRBCs during this admission  -Hgb stable  DVT prophylaxis: Lovenox Code Status: Full code Family Communication: No family at bedside Disposition Plan/Expected LOS: remained inpatient  Consultants: PCCM Nephrology Psychiatry General surgery IR  Procedures: 2/27 incision and drainage left buttock abscess  3/3 right IJ hemodialysis catheter  CULTURES:  2/27 MRCA PCR >>> neg  2/27 Blood >>> neg  2/27 Wound >>> PROTEUS  Antibiotics: Vancomycin 2/27 >>> 3/4  Zosyn 2/27 >>> 3/5  Levaquin 3/1 >>> 3/4  Ceftriaxone 3/5 >>>3/14   HPI/Subjective: Patient denies CP or SOB. No overnight complaints. Patient making some urin now.  Objective: Blood pressure 150/68, pulse 94, temperature 99 F (37.2 C), temperature source Oral, resp. rate 20, height 5\' 7"  (1.702 m), weight 114.805 kg (253 lb 1.6 oz), last menstrual period 02/26/2013, SpO2 100.00%.  Intake/Output Summary (Last 24  hours) at  04/07/13 1618 Last data filed at 04/07/13 1300  Gross per 24 hour  Intake   1080 ml  Output      0 ml  Net   1080 ml     Exam: General: No acute respiratory distress-flat affect; low grade fever Lungs: Clear to auscultation bilaterally without wheezes or crackles, good O2 sat on RA Cardiovascular: Regular rate and rhythm without murmur gallop or rub normal S1 and S2, no peripheral edema or JVD Abdomen: no tender, soft, positive BS Musculoskeletal: No significant cyanosis, clubbing of bilateral lower extremities Neurological: Alert and oriented x 3, moves all extremities x 4 without focal neurological deficits, CN 2-12 intact  Scheduled Meds:  Scheduled Meds: . aspirin  81 mg Oral Daily  . atorvastatin  40 mg Oral q1800  . calcium acetate  667 mg Oral TID WC  . cefTRIAXone (ROCEPHIN)  IV  2 g Intravenous Q24H  . clonazePAM  0.5 mg Oral BID  . collagenase   Topical Daily  . darbepoetin (ARANESP) injection - DIALYSIS  100 mcg Intravenous Q Tue-HD  . enoxaparin (LOVENOX) injection  30 mg Subcutaneous Q24H  . feeding supplement (NEPRO CARB STEADY)  237 mL Oral BID BM  . insulin aspart  0-20 Units Subcutaneous TID WC & HS  . insulin glargine  35 Units Subcutaneous Daily  . metoCLOPramide (REGLAN) injection  5 mg Intravenous Q12H  . multivitamin  1 tablet Oral QHS  . ondansetron (ZOFRAN) IV  4 mg Intravenous 4 times per day  . saccharomyces boulardii  250 mg Oral BID  . traZODone  50 mg Oral QHS   Continuous Infusions:   Data Reviewed: Basic Metabolic Panel:  Recent Labs Lab 04/01/13 0500 04/02/13 0500 04/03/13 1202 04/04/13 0530 04/06/13 0900  NA 136* 139 136* 135* 138  K 4.1 3.8 3.7 3.9 4.1  CL 94* 97 94* 90* 94*  CO2 24 22 22 22 23   GLUCOSE 204* 169* 216* 214* 267*  BUN 43* 64* 76* 36* 23  CREATININE 3.41* 4.72* 4.93* 4.13* 4.49*  CALCIUM 9.4 9.6 9.2 8.9 9.4  PHOS  --  7.1* 6.6*  --  2.8   Liver Function Tests:  Recent Labs Lab 04/02/13 0500 04/03/13 1202  04/06/13 0900  ALBUMIN 2.8* 2.7* 2.9*   CBC:  Recent Labs Lab 04/01/13 0500 04/02/13 0500 04/03/13 1202 04/04/13 0008 04/07/13 0559  WBC 17.0* 21.3* 15.5* 19.5* 10.9*  HGB 9.5* 9.8* 9.2* 10.1* 10.0*  HCT 29.1* 30.2* 28.0* 31.4* 32.1*  MCV 89.5 89.1 86.7 88.0 91.7  PLT 363 436* 362 454* 417*   BNP (last 3 results)  Recent Labs  10/07/12 2126 11/01/12 1446 03/24/13 1200  PROBNP 1631.0* 2441.0* 6479.0*   CBG:  Recent Labs Lab 04/06/13 1150 04/06/13 1708 04/06/13 2019 04/07/13 0750 04/07/13 1214  GLUCAP 221* 270* 285* 223* 307*    Recent Results (from the past 240 hour(s))  CLOSTRIDIUM DIFFICILE BY PCR     Status: None   Collection Time    04/04/13  6:03 PM      Result Value Ref Range Status   C difficile by pcr NEGATIVE  NEGATIVE Final    Armen Pickup [phone]  04/07/2013, 4:18 PM   LOS: 15 days

## 2013-04-07 NOTE — Progress Notes (Signed)
  [facility] Progress Note   Subjective: Has had some more UOP , not great amounts.  Labs pending today.   Filed Vitals:   04/06/13 1700 04/06/13 2021 04/07/13 0455 04/07/13 0700  BP: 143/65 141/51 163/72 154/73  Pulse: 99 99 101 103  Temp: 99.2 F (37.3 C) 99.5 F (37.5 C) 99.5 F (37.5 C) 98.9 F (37.2 C)  TempSrc: Oral Oral Oral Oral  Resp: 19 20 20 20   Height:      Weight:      SpO2: 99% 98% 100% 100%   Exam: Gen: calm NAD on HD Heart: tachy regular Lungs: no wheezes or rales Abdomen: obese soft Extremities: Tr LE edema Dialysis Access: right I-J temp cath  Assessment: 1 Acute on CRF- holding HD over weekend, making some urine, labs pending, vol good 2 CKD stage 3/4, baseline creat 1.8-2.1 3 DM w multiple complications 4 L buttock abcess s/p I&D 5 Anemia on darbe 100/wk 6 HTN was on 3 BP meds at home, none here  Plan- cont to watch off of HD, allow higher BP's (permissive HTN, do not treat until SBP over 170) while awaiting renal recovery  Vinson Moselleob Shanora Christensen MD (pgr) [phone]    (c) [phone] 04/06/2013, 3:11 PM     Recent Labs Lab 04/02/13 0500 04/03/13 1202 04/04/13 0530 04/06/13 0900  NA 139 136* 135* 138  K 3.8 3.7 3.9 4.1  CL 97 94* 90* 94*  CO2 22 22 22 23   GLUCOSE 169* 216* 214* 267*  BUN 64* 76* 36* 23  CREATININE 4.72* 4.93* 4.13* 4.49*  CALCIUM 9.6 9.2 8.9 9.4  PHOS 7.1* 6.6*  --  2.8    Recent Labs Lab 04/02/13 0500 04/03/13 1202 04/06/13 0900  ALBUMIN 2.8* 2.7* 2.9*    Recent Labs Lab 04/03/13 1202 04/04/13 0008 04/07/13 0559  WBC 15.5* 19.5* 10.9*  HGB 9.2* 10.1* 10.0*  HCT 28.0* 31.4* 32.1*  MCV 86.7 88.0 91.7  PLT 362 454* 417*   . aspirin  81 mg Oral Daily  . atorvastatin  40 mg Oral q1800  . calcium acetate  667 mg Oral TID WC  . cefTRIAXone (ROCEPHIN)  IV  2 g Intravenous Q24H  . clonazePAM  0.5 mg Oral BID  . collagenase   Topical Daily  . darbepoetin (ARANESP) injection - DIALYSIS  100 mcg  Intravenous Q Tue-HD  . enoxaparin (LOVENOX) injection  30 mg Subcutaneous Q24H  . feeding supplement (NEPRO CARB STEADY)  237 mL Oral BID BM  . insulin aspart  0-20 Units Subcutaneous TID WC & HS  . insulin glargine  35 Units Subcutaneous Daily  . metoCLOPramide (REGLAN) injection  5 mg Intravenous Q12H  . multivitamin  1 tablet Oral QHS  . ondansetron (ZOFRAN) IV  4 mg Intravenous 4 times per day  . saccharomyces boulardii  250 mg Oral BID  . traZODone  50 mg Oral QHS     acetaminophen, albuterol, alum & mag hydroxide-simeth, feeding supplement (NEPRO CARB STEADY), heparin, heparin, LORazepam, metoprolol, morphine injection, nitroGLYCERIN, ondansetron (ZOFRAN) IV, zolpidem

## 2013-04-08 DIAGNOSIS — E1139 Type 2 diabetes mellitus with other diabetic ophthalmic complication: Secondary | ICD-10-CM

## 2013-04-08 LAB — URINE CULTURE
COLONY COUNT: NO GROWTH
CULTURE: NO GROWTH

## 2013-04-08 LAB — BASIC METABOLIC PANEL
BUN: 43 mg/dL — ABNORMAL HIGH (ref 6–23)
CO2: 25 mEq/L (ref 19–32)
Calcium: 8.9 mg/dL (ref 8.4–10.5)
Chloride: 93 mEq/L — ABNORMAL LOW (ref 96–112)
Creatinine, Ser: 5.82 mg/dL — ABNORMAL HIGH (ref 0.50–1.10)
GFR calc Af Amer: 9 mL/min — ABNORMAL LOW (ref >90)
GFR calc non Af Amer: 8 mL/min — ABNORMAL LOW (ref >90)
Glucose, Bld: 335 mg/dL — ABNORMAL HIGH (ref 70–99)
Potassium: 4.8 mEq/L (ref 3.7–5.3)
Sodium: 135 mEq/L — ABNORMAL LOW (ref 137–147)

## 2013-04-08 LAB — GLUCOSE, CAPILLARY
GLUCOSE-CAPILLARY: 165 mg/dL — AB (ref 70–99)
Glucose-Capillary: 310 mg/dL — ABNORMAL HIGH (ref 70–99)
Glucose-Capillary: 218 mg/dL — ABNORMAL HIGH (ref 70–99)
Glucose-Capillary: 155 mg/dL — ABNORMAL HIGH (ref 70–99)

## 2013-04-08 MED ORDER — ONDANSETRON HCL 4 MG/2ML IJ SOLN: 4.0000 mg | Freq: Four times a day (QID) | INTRAMUSCULAR | Status: DC | PRN

## 2013-04-08 MED ORDER — INSULIN GLARGINE 100 UNIT/ML ~~LOC~~ SOLN
50.0000 [IU] | Freq: Every day | SUBCUTANEOUS | Status: DC
  Administered 2013-04-09 – 2013-04-10 (×2): 50 [IU] via SUBCUTANEOUS
  Filled 2013-04-08 (×2): qty 0.5

## 2013-04-08 MED ORDER — METOCLOPRAMIDE HCL 5 MG PO TABS
5.0000 mg | ORAL_TABLET | Freq: Three times a day (TID) | ORAL | Status: DC
  Administered 2013-04-08 – 2013-04-13 (×17): 5 mg via ORAL
  Filled 2013-04-08 (×17): qty 1

## 2013-04-08 NOTE — Progress Notes (Signed)
Progress Note  Khadijah L Iwan ZOX:[id number] DOB: 08-08-1970 DOA: 03/23/2013 PCP: Dois Davenport., MD  Brief narrative: 43 yo with poorly controlled insulin-dependent diabetes mellitus admitted 2/27 for L buttock abscess, sepsis and DKA. Intubated for hypoxemic respiratory failure.Buttock abscess was I/D per surgery. CT abdomen revealed airspace consolidation R > L with air bronchograms suggesting multifocal PNA. Also small bilat effusions and low lung volumes. Txd with Vanco and Zosyn. Cx found positive for strep. Sx's of volume overload persisted so Nephro consulted- initially used high dose IV Lasix but limited response and progressive renal failure. Was transferred to [facility] for CRRT. Stabilized and was able to be extubated. Remains on HD and possibly this will become chronic but still awaiting renal recovery. Pt depressed over current health status so psychiatry consulted  Assessment/Plan: DKA (diabetic ketoacidoses)/DIABETES MELLITUS, TYPE I -resolved  -CBG's has remained in the 220's-300 range -continue lantus (but will increase dose to 50 units), SSI and meal coverage (patient eating better)  Acute-on-chronic kidney injury/ CKD (chronic kidney disease), stage IV -per Nephrology -patient continue to make some urine (last night had 200cc and this morning 250cc) Cr slightly coming down without HD -will continue holding HD as recommended by renal service and follow labs -hopping for renal recovery -patient remains oliguric currently  Sepsis -due to PNA and buttock abscess -sepsis physiology has resolved -antibiotic therapy completed -continue wound care as indicated by surgery service -no fever and WBC's trending down -will monitor  Acute respiratory failure with hypoxia: A) Volume Overload B) Strep PNA C) Heart failure, diastolic, chronic D) OSA (obstructive sleep apnea) -PNA initially then volume overload from volume resuscitation in setting of ARF -patient approx 13-14 L  off since HD started; plan is to maintain current volume while waiting for recovery -continue CPAP QHS -patient no longer SOB; currently making some urine now -will continue holding HD for now and follow labs.  Diarrhea -has leukocytosis and low grade fevers -C. Diff neg -continue rocephin -associated to side effects of abx's; will continue probiotic   Hypertension and volume -will be permissive with HTN during kidney recovery -BP and volume was controlled with HD  Gastroparesis -significant nausea explained by gastroparesis and uremia -also could be 2/2 cause of diarrhea -change reglan to PO TID -continue PRN zofran  Left buttock abscess/ Abscess, perirectal s/p I&D 03/23/2013 -per [facility] -cont current wound care and hydrotherapy by PT -Wound culture positive for Proteus -antibiotics completed -no fever -CBC showing WBC's trending down  DEPRESSION and anxiety -Psych has seen patient and recommended klonopin 0.5 mg BID and trazodone 50mg  QHS -no SI  Anemia, chronic disease -cont Aranesp -has received 1 unit PRBCs during this admission  -Hgb stable  DVT prophylaxis: Lovenox Code Status: Full code Family Communication: No family at bedside Disposition Plan/Expected LOS: remained inpatient  Consultants: PCCM Nephrology Psychiatry General surgery IR  Procedures: 2/27 incision and drainage left buttock abscess  3/3 right IJ hemodialysis catheter  CULTURES:  2/27 MRCA PCR >>> neg  2/27 Blood >>> neg  2/27 Wound >>> PROTEUS  Antibiotics: Vancomycin 2/27 >>> 3/4  Zosyn 2/27 >>> 3/5  Levaquin 3/1 >>> 3/4  Ceftriaxone 3/5 >>>3/14   HPI/Subjective: Patient denies CP or SOB. No overnight complaints. Patient continue making some urine. Afebrile   Objective: Blood pressure 138/60, pulse 90, temperature 98.9 F (37.2 C), temperature source Oral, resp. rate 19, height 5\' 7"  (1.702 m), weight 115.486 kg (254 lb 9.6 oz), last menstrual period 02/26/2013, SpO2  99.00%.  Intake/Output  Summary (Last 24 hours) at 04/08/13 1352 Last data filed at 04/08/13 0900  Gross per 24 hour  Intake    600 ml  Output    450 ml  Net    150 ml     Exam: General: No acute respiratory distress-flat affect; low grade fever Lungs: Clear to auscultation bilaterally without wheezes or crackles, good O2 sat on RA Cardiovascular: Regular rate and rhythm without murmur gallop or rub normal S1 and S2, no peripheral edema or JVD Abdomen: no tender, soft, positive BS Musculoskeletal: No significant cyanosis, clubbing of bilateral lower extremities Neurological: Alert and oriented x 3, moves all extremities x 4 without focal neurological deficits, CN 2-12 intact  Scheduled Meds:  Scheduled Meds: . aspirin  81 mg Oral Daily  . atorvastatin  40 mg Oral q1800  . calcium acetate  667 mg Oral TID WC  . clonazePAM  0.5 mg Oral BID  . collagenase   Topical Daily  . darbepoetin (ARANESP) injection - DIALYSIS  100 mcg Intravenous Q Tue-HD  . enoxaparin (LOVENOX) injection  30 mg Subcutaneous Q24H  . feeding supplement (NEPRO CARB STEADY)  237 mL Oral BID BM  . insulin aspart  0-20 Units Subcutaneous TID WC & HS  . insulin aspart  4 Units Subcutaneous TID WC  . insulin glargine  45 Units Subcutaneous Daily  . metoCLOPramide  5 mg Oral TID AC  . multivitamin  1 tablet Oral QHS  . saccharomyces boulardii  250 mg Oral BID  . traZODone  50 mg Oral QHS   Continuous Infusions:   Data Reviewed: Basic Metabolic Panel:  Recent Labs Lab 04/02/13 0500 04/03/13 1202 04/04/13 0530 04/06/13 0900 04/07/13 1606 04/08/13 0547  NA 139 136* 135* 138 137 135*  K 3.8 3.7 3.9 4.1 4.3 4.8  CL 97 94* 90* 94* 95* 93*  CO2 22 22 22 23 28 25   GLUCOSE 169* 216* 214* 267* 219* 335*  BUN 64* 76* 36* 23 38* 43*  CREATININE 4.72* 4.93* 4.13* 4.49* 6.08* 5.82*  CALCIUM 9.6 9.2 8.9 9.4 9.3 8.9  PHOS 7.1* 6.6*  --  2.8  --   --    Liver Function Tests:  Recent Labs Lab 04/02/13 0500  04/03/13 1202 04/06/13 0900  ALBUMIN 2.8* 2.7* 2.9*   CBC:  Recent Labs Lab 04/02/13 0500 04/03/13 1202 04/04/13 0008 04/07/13 0559  WBC 21.3* 15.5* 19.5* 10.9*  HGB 9.8* 9.2* 10.1* 10.0*  HCT 30.2* 28.0* 31.4* 32.1*  MCV 89.1 86.7 88.0 91.7  PLT 436* 362 454* 417*   BNP (last 3 results)  Recent Labs  10/07/12 2126 11/01/12 1446 03/24/13 1200  PROBNP 1631.0* 2441.0* 6479.0*   CBG:  Recent Labs Lab 04/07/13 1214 04/07/13 1714 04/07/13 2049 04/08/13 0818 04/08/13 1208  GLUCAP 307* 168* 211* 310* 218*    Recent Results (from the past 240 hour(s))  CLOSTRIDIUM DIFFICILE BY PCR     Status: None   Collection Time    04/04/13  6:03 PM      Result Value Ref Range Status   C difficile by pcr NEGATIVE  NEGATIVE Final    Armen PickupManzueta, Ashlyn Cabler E Dixie Jafri [phone]  04/08/2013, 1:52 PM   LOS: 16 days

## 2013-04-08 NOTE — Progress Notes (Signed)
  [facility] Progress Note   Subjective: Voided last night and again this am, creat down t. 5.8 today   Filed Vitals:   04/07/13 2051 04/07/13 2300 04/08/13 0453 04/08/13 0900  BP: 145/72  154/94 138/60  Pulse: 95  93 90  Temp: 99.1 F (37.3 C)  98.8 F (37.1 C) 98.9 F (37.2 C)  TempSrc: Oral  Oral Oral  Resp: 20  18 19   Height:      Weight:  115.486 kg (254 lb 9.6 oz)    SpO2: 98%  96% 99%   Exam: Gen: calm NAD on HD Heart: tachy regular Lungs: no wheezes or rales Abdomen: obese soft Extremities: Tr LE edema Dialysis Access: right I-J temp cath  Assessment: 1 Acute on CRF- early recovery phase, making urine, Cr down slightly without dialysis. Cont to observe, keep HD cath in for now, daily labs 2 CKD stage 3/4, baseline creat 1.8-2.1 3 DM w multiple complications 4 L buttock abcess s/p I&D 5 Anemia on darbe 100/wk 6 HTN was on 3 BP meds at home, none here  Plan- cont to watch off of HD, let BP run on high side for now  Vinson Moselleob Hiren Peplinski MD (pgr) [phone]    ([phone]) [phone] 04/06/2013, 3:11 PM     Recent Labs Lab 04/02/13 0500 04/03/13 1202  04/06/13 0900 04/07/13 1606 04/08/13 0547  NA 139 136*  < > 138 137 135*  K 3.8 3.7  < > 4.1 4.3 4.8  CL 97 94*  < > 94* 95* 93*  CO2 22 22  < > 23 28 25   GLUCOSE 169* 216*  < > 267* 219* 335*  BUN 64* 76*  < > 23 38* 43*  CREATININE 4.72* 4.93*  < > 4.49* 6.08* 5.82*  CALCIUM 9.6 9.2  < > 9.4 9.3 8.9  PHOS 7.1* 6.6*  --  2.8  --   --   < > = values in this interval not displayed.  Recent Labs Lab 04/02/13 0500 04/03/13 1202 04/06/13 0900  ALBUMIN 2.8* 2.7* 2.9*    Recent Labs Lab 04/03/13 1202 04/04/13 0008 04/07/13 0559  WBC 15.5* 19.5* 10.9*  HGB 9.2* 10.1* 10.0*  HCT 28.0* 31.4* 32.1*  MCV 86.7 88.0 91.7  PLT 362 454* 417*   . aspirin  81 mg Oral Daily  . atorvastatin  40 mg Oral q1800  . calcium acetate  667 mg Oral TID WC  . clonazePAM  0.5 mg Oral BID  . collagenase   Topical  Daily  . darbepoetin (ARANESP) injection - DIALYSIS  100 mcg Intravenous Q Tue-HD  . enoxaparin (LOVENOX) injection  30 mg Subcutaneous Q24H  . feeding supplement (NEPRO CARB STEADY)  237 mL Oral BID BM  . insulin aspart  0-20 Units Subcutaneous TID WC & HS  . insulin aspart  4 Units Subcutaneous TID WC  . insulin glargine  45 Units Subcutaneous Daily  . metoCLOPramide  5 mg Oral TID AC  . multivitamin  1 tablet Oral QHS  . saccharomyces boulardii  250 mg Oral BID  . traZODone  50 mg Oral QHS     acetaminophen, albuterol, alum & mag hydroxide-simeth, feeding supplement (NEPRO CARB STEADY), heparin, heparin, LORazepam, metoprolol, morphine injection, nitroGLYCERIN, ondansetron (ZOFRAN) IV, zolpidem

## 2013-04-09 DIAGNOSIS — L89309 Pressure ulcer of unspecified buttock, unspecified stage: Secondary | ICD-10-CM

## 2013-04-09 LAB — GLUCOSE, CAPILLARY
GLUCOSE-CAPILLARY: 214 mg/dL — AB (ref 70–99)
Glucose-Capillary: 257 mg/dL — ABNORMAL HIGH (ref 70–99)
Glucose-Capillary: 208 mg/dL — ABNORMAL HIGH (ref 70–99)
Glucose-Capillary: 225 mg/dL — ABNORMAL HIGH (ref 70–99)

## 2013-04-09 LAB — BASIC METABOLIC PANEL
BUN: 48 mg/dL — ABNORMAL HIGH (ref 6–23)
CALCIUM: 9.1 mg/dL (ref 8.4–10.5)
CHLORIDE: 97 meq/L (ref 96–112)
CO2: 17 meq/L — AB (ref 19–32)
Creatinine, Ser: 4.84 mg/dL — ABNORMAL HIGH (ref 0.50–1.10)
GFR calc Af Amer: 12 mL/min — ABNORMAL LOW (ref >90)
GFR calc non Af Amer: 10 mL/min — ABNORMAL LOW (ref >90)
Glucose, Bld: 222 mg/dL — ABNORMAL HIGH (ref 70–99)
Potassium: 5.2 mEq/L (ref 3.7–5.3)
SODIUM: 134 meq/L — AB (ref 137–147)

## 2013-04-09 NOTE — Progress Notes (Signed)
Patient ID: Jasmine [name], female   DOB: 11-27-1970, 43 y.o.   MRN: [id number]  [facility] Progress Note    Assessment/ Plan:   1 Acute on CKD 3- excellent urine output overnight, renal function now showing improvement. No acute dialysis needs today. If she continues to show renal recovery of the next 24 hours and does not need dialysis, we'll discontinue her dialysis catheter. 2 CKD stage 3 (baseline creat 1.8-2.1)  : Appears to be from underlying diabetes/hypertension, continue to monitor 3 DM w multiple complications  4 L buttock abcess s/p I&D  5 Anemia on darbe 100/wk , no overt losses 6 HTN was on 3 BP meds at home, none here-anticipate to increase status post recovery from the polyuric ATN recovery phase  Subjective:   Reports to be doing well, denies any nausea/vomiting/dysgeusia or shortness of breath.    Objective:   BP 129/78  Pulse 88  Temp(Src) 98.2 F (36.8 C) (Oral)  Resp 18  Ht 5\' 7"  (1.702 m)  Wt 115.713 kg (255 lb 1.6 oz)  BMI 39.95 kg/m2  SpO2 100%  LMP 02/26/2013  Intake/Output Summary (Last 24 hours) at 04/09/13 1123 Last data filed at 04/09/13 0900  Gross per 24 hour  Intake    718 ml  Output    235 ml  Net    483 ml   Weight change: 0.907 kg (2 lb)  Physical Exam: Gen: Comfortably resting in bed. Right IJ temperature dialysis catheter CVS: Pulse regular in rate and rhythm, S1 and S2 normal Resp: Clear to auscultation bilaterally-no rales Abd: Soft, obese, nontender and bowel sounds are normal Ext: One plus lower extremity edema  Imaging: No results found.  Labs: BMET  Recent Labs Lab 04/03/13 1202 04/04/13 0530 04/06/13 0900 04/07/13 1606 04/08/13 0547 04/09/13 0450  NA 136* 135* 138 137 135* 134*  K 3.7 3.9 4.1 4.3 4.8 5.2  CL 94* 90* 94* 95* 93* 97  CO2 22 22 23 28 25  17*  GLUCOSE 216* 214* 267* 219* 335* 222*  BUN 76* 36* 23 38* 43* 48*  CREATININE 4.93* 4.13* 4.49* 6.08* 5.82* 4.84*  CALCIUM 9.2 8.9 9.4 9.3  8.9 9.1  PHOS 6.6*  --  2.8  --   --   --    CBC  Recent Labs Lab 04/03/13 1202 04/04/13 0008 04/07/13 0559  WBC 15.5* 19.5* 10.9*  HGB 9.2* 10.1* 10.0*  HCT 28.0* 31.4* 32.1*  MCV 86.7 88.0 91.7  PLT 362 454* 417*    Medications:    . aspirin  81 mg Oral Daily  . atorvastatin  40 mg Oral q1800  . calcium acetate  667 mg Oral TID WC  . clonazePAM  0.5 mg Oral BID  . collagenase   Topical Daily  . darbepoetin (ARANESP) injection - DIALYSIS  100 mcg Intravenous Q Tue-HD  . enoxaparin (LOVENOX) injection  30 mg Subcutaneous Q24H  . insulin aspart  0-20 Units Subcutaneous TID WC & HS  . insulin aspart  4 Units Subcutaneous TID WC  . insulin glargine  50 Units Subcutaneous Daily  . metoCLOPramide  5 mg Oral TID AC  . multivitamin  1 tablet Oral QHS  . saccharomyces boulardii  250 mg Oral BID  . traZODone  50 mg Oral QHS   Zetta BillsJay Carmilla Granville, MD 04/09/2013, 11:23 AM

## 2013-04-09 NOTE — Procedures (Signed)
Pre-op Diagnosis: Unstageable pressure ulcer left buttock Post-op Diagnosis: Stage 3 pressure ulcer left buttock Procedure: Debridement of left buttock pressure ulcer Surgeons: Aris GeorgiaMegan Dort PA-C Findings: Stage 3 pressure ulcer Anesthesia: Local with 10mL 1% Lidocaine with epinephrine Fluids: N/A Estimated blood loss: 5mL Drains: N/A Specimens: None Complications: None  Condition: Stable, good hemostasis  Procedure Details: Informed patient of risks (including those of bleeding, infection, and injury to other structures), benefits of procedure, and alternatives to the procedure. All questions were sought and answered. Written and verbal consent given by Mrs. Jasmine [name] to proceed with the procedure.  Sterile technique for procedure was done. Injected 10mL of 1% lidocaine with epinephrine as a field block. Did a circular incision with 10 blade and debrided approximately 3.5cm diameter area of necrotic skin and subcutaneous tissue.  I probed for additional areas of necrosis and found none.  I debrided down to good viable bleeding subcutaneous tissue.  Did not extend to bone or muscle.  Stage 3 pressure ulcer was identified.  Removed skin and subcutaneous tissue to the depth of 2.5cm.  The wound cavity was packed with wet gauze, dry dressing, and pressure bandage was applied.  Will order packing changes BID or more frequently as needed for saturation.  Hydrotherapy should continue.   Aris GeorgiaMegan Dort, PA-C [facility] Office: [phone] Pager:  [phone]

## 2013-04-09 NOTE — Progress Notes (Signed)
Bedside debridement to start.

## 2013-04-09 NOTE — Progress Notes (Signed)
Physical Therapy Wound Treatment Patient Details  Name: Jasmine [name] MRN: [id number] Date of Birth: 05/02/1970  Today's Date: 04/09/2013 Time: 0350-0938 Time Calculation (min): 28 min  Subjective  Subjective: "It's sore when I sit on it"  Pain Score: Pain Score: 4   Wound Assessment  Wound / Incision (Open or Dehisced) 03/23/13 Other (Comment) Buttocks Left abscess, I&D performed today (Active)  Dressing Type ABD;Barrier Film (skin prep);Gauze (Comment);Moist to dry;Tape dressing;Other (Comment) 04/09/2013  2:05 PM  Dressing Changed Changed 04/09/2013  2:05 PM  Dressing Status Clean;Dry;Intact 04/09/2013  2:05 PM  Dressing Change Frequency Twice a day 04/09/2013  2:05 PM  Site / Wound Assessment Brown;Yellow;Granulation tissue 04/09/2013  2:05 PM  % Wound base Red or Granulating 5% 04/09/2013  2:05 PM  % Wound base Yellow 20% 04/09/2013  2:05 PM  % Wound base Black 75% 04/09/2013  2:05 PM  Peri-wound Assessment Intact;Erythema (blanchable) 04/09/2013  2:05 PM  Undermining (cm) Opening on medial side of wound 04/09/2013  2:05 PM  Margins Unattached edges (unapproximated) 04/09/2013  2:05 PM  Closure None 04/09/2013  2:05 PM  Drainage Amount Moderate 04/09/2013  2:05 PM  Drainage Description Purulent;No odor 04/09/2013  2:05 PM  Treatment Debridement (Selective);Hydrotherapy (Pulse lavage);Packing (Impregnated strip);Tape changed 04/09/2013  2:05 PM   Hydrotherapy Pulsed lavage therapy - wound location: Lt buttock Pulsed Lavage with Suction (psi): 8 psi Pulsed Lavage with Suction - Normal Saline Used: 1000 mL Pulsed Lavage Tip: Tip with splash shield Selective Debridement Selective Debridement - Location: Lt buttock Selective Debridement - Tools Used: Forceps;Scissors Selective Debridement - Tissue Removed: brown/yellow necrotic tissue   Wound Assessment and Plan  Wound Therapy - Assess/Plan/Recommendations Wound Therapy - Clinical Statement: Eschar loosening.  Agree with need for surgical  debridement to open area to better drain and promote healing. Wound Therapy - Functional Problem List: limited positioning and mobility due to pain and drainage Factors Delaying/Impairing Wound Healing: Diabetes Mellitus;Infection - systemic/local;Immobility;Multiple medical problems Hydrotherapy Plan: Debridement;Dressing change;Patient/family education;Pulsatile lavage with suction Wound Therapy - Frequency: 6X / week Wound Therapy - Current Recommendations: Diabetic teaching Wound Therapy - Follow Up Recommendations: Home health RN Wound Plan: see above  Wound Therapy Goals- Improve the function of patient's integumentary system by progressing the wound(s) through the phases of wound healing (inflammation - proliferation - remodeling) by: Decrease Necrotic Tissue - Progress: Progressing toward goal Increase Granulation Tissue - Progress: Progressing toward goal Improve Drainage Characteristics - Progress: Progressing toward goal Patient/Family Instruction Goal - Progress: Progressing toward goal  Goals will be updated until maximal potential achieved or discharge criteria met.  Discharge criteria: when goals achieved, discharge from hospital, MD decision/surgical intervention, no progress towards goals, refusal/missing three consecutive treatments without notification or medical reason.  GP     Despina Pole 04/09/2013, 2:10 PM Carita Pian. Sanjuana Kava, [place] Pager [phone]

## 2013-04-09 NOTE — Progress Notes (Signed)
Physical Therapy Treatment Patient Details Name: Jasmine [name] MRN: [id number] DOB: [birth year] Today's Date: 04/09/2013 Time: [id number] PT Time Calculation (min): 17 min  PT Assessment / Plan / Recommendation  History of Present Illness Jasmine [name] is a 43 y.o. female with a past medical history of poorly controlled insulin-dependent diabetes mellitus, nonobstructive coronary artery disease per cardiac catheterization in 2010, hypertension, stage III chronic kidney disease, presented with elevated blood sugars and buttock abcess with elevated white count and abnormal bicarbonate levels.  Was intubated on 3/3 and [facility], extubated 3/8.   PT Comments   Patient well fatigued after ambulation and stair negotiation, but able to complete flight with moderate fatigue level.  Needed seated rest and noted increased HR~120.  Will need continued skilled PT to achieve maximal functional level prior to d/c.  Feel cane more appropriate device and will practice with cane next session.  Follow Up Recommendations  Home health PT;Supervision/Assistance - 24 hour     Does the patient have the potential to tolerate intense rehabilitation   N/A  Barriers to Discharge  None      Equipment Recommendations  Cane    Recommendations for Other Services OT consult  Frequency Min 3X/week   Progress towards PT Goals Progress towards PT goals: Progressing toward goals  Plan Current plan remains appropriate;Equipment recommendations need to be updated    Precautions / Restrictions Precautions Precautions: Fall Restrictions Weight Bearing Restrictions: No   Pertinent Vitals/Pain Min c/o discomfort in legs with negotiating stairs    Mobility  Bed Mobility General bed mobility comments: NT patient up in chair Transfers Equipment used: None Transfers: Sit to/from Stand Sit to Stand: Supervision General transfer comment: reports up in room on her  own Ambulation/Gait Ambulation/Gait assistance: Min guard;Supervision Ambulation Distance (Feet): 250 Feet (with one seated rest) Assistive device: None Gait Pattern/deviations: Wide base of support;Shuffle General Gait Details: waddling gait patient reports in premorbid Stairs: Yes Stairs assistance: Min guard Stair Management: Two rails;Step to pattern;Forwards Number of Stairs: 10 General stair comments: slow with three standing rest breaks; cues for sequence      PT Goals (current goals can now be found in the care plan section)    Visit Information  Last PT Received On: 04/09/13 Assistance Needed: +1 History of Present Illness: Jasmine [name] is a 43 y.o. female with a past medical history of poorly controlled insulin-dependent diabetes mellitus, nonobstructive coronary artery disease per cardiac catheterization in 2010, hypertension, stage III chronic kidney disease, presented with elevated blood sugars and buttock abcess with elevated white count and abnormal bicarbonate levels.  Was intubated on 3/3 and [facility], extubated 3/8.    Subjective Data      Cognition  Cognition Arousal/Alertness: Awake/alert Behavior During Therapy: WFL for tasks assessed/performed Overall Cognitive Status: Within Functional Limits for tasks assessed    Balance  Balance Standing balance-Leahy Scale: Good Standing balance comment: able to walk without UE assist  End of Session PT - End of Session Equipment Utilized During Treatment: Gait belt Activity Tolerance: Patient limited by fatigue Patient left: in chair;with call bell/phone within reach   GP     [facility]YNN,CYNDI 04/09/2013, 10:00 AM Sheran Lawlessyndi Froilan Mclean, PT [phone] 04/09/2013

## 2013-04-09 NOTE — Progress Notes (Addendum)
NUTRITION FOLLOW-UP  Intervention:   Discontinue Nepro Shake PO BID - pt refusing. We reviewed renal diet restrictions during this visit, RD provided several handouts. RD to continue to follow nutrition care plan.  Nutrition Dx:   Inadequate oral intake related to inability to eat as evidenced by NPO status. Resolved.   New Nutrition Dx: Increased nutrient needs related to wound healing AEB estimated needs.  Goal:   Intake to meet >90% of estimated nutrition needs. Met.  Monitor:   PO intake, labs, weight trend.  Assessment:   Patient is a 43 y.o. female with a past medical history of poorly controlled insulin-dependent diabetes mellitus, nonobstructive coronary artery disease, hypertension, stage III chronic kidney disease, presenting to the emergency department with complaints of elevated blood sugars. In the ED, found to have left buttock abscess, S/P I&D.  Patient was extubated on 3/8. S/P CRRT from 3/4-3/6. Now receiving intermittent HD, last treatment 3/12. UOP improved over the weekend. Renal seeing daily to assess lab trends and need for HD.  [place] RN consulted for L buttock full thickness wound. RN recommending debridement of nonviable tissue of this wound. Receiving hydrotherapy for wounds.  Pt is eating 100% of all meals. Continues on Renal/CHO Modified diet with 2000 ml fluid restriction.  Potassium and phosphorus WNL. Pt with questions regarding renal diet, RD provide "Kidney Disease Pyramid", Snack Ideas for HD/Renal Disease and Meal Plan for HD/Renal Disease.  Height: Ht Readings from Last 1 Encounters:  03/27/13 $RemoveB'5\' 7"'HdeJTAPD$  (1.702 m)    Weight Status:   Wt Readings from Last 1 Encounters:  04/08/13 255 lb 1.6 oz (115.713 kg)  03/29/13  272 lb 0.8 oz (123.4 kg) ; -12 liters since admission  Body mass index is 39.95 kg/(m^2). Obese Class II  Re-estimated needs:  Kcal: 1900-2100 Protein: 110-120 gm Fluid: 2 liters  Skin: left buttock abscess S/P I&D  Diet Order:  Diabetic   Intake/Output Summary (Last 24 hours) at 04/09/13 0933 Last data filed at 04/08/13 2200  Gross per 24 hour  Intake    478 ml  Output    235 ml  Net    243 ml    Last BM: 3/15   Labs:   Recent Labs Lab 04/03/13 1202  04/06/13 0900 04/07/13 1606 04/08/13 0547 04/09/13 0450  NA 136*  < > 138 137 135* 134*  K 3.7  < > 4.1 4.3 4.8 5.2  CL 94*  < > 94* 95* 93* 97  CO2 22  < > $R'23 28 25 'WS$ 17*  BUN 76*  < > 23 38* 43* 48*  CREATININE 4.93*  < > 4.49* 6.08* 5.82* 4.84*  CALCIUM 9.2  < > 9.4 9.3 8.9 9.1  PHOS 6.6*  --  2.8  --   --   --   GLUCOSE 216*  < > 267* 219* 335* 222*  < > = values in this interval not displayed.  CBG (last 3)   Recent Labs  04/08/13 1722 04/08/13 2210 04/09/13 0810  GLUCAP 155* 165* 214*    Scheduled Meds: . aspirin  81 mg Oral Daily  . atorvastatin  40 mg Oral q1800  . calcium acetate  667 mg Oral TID WC  . clonazePAM  0.5 mg Oral BID  . collagenase   Topical Daily  . darbepoetin (ARANESP) injection - DIALYSIS  100 mcg Intravenous Q Tue-HD  . enoxaparin (LOVENOX) injection  30 mg Subcutaneous Q24H  . feeding supplement (NEPRO CARB STEADY)  237 mL Oral BID  BM  . insulin aspart  0-20 Units Subcutaneous TID WC & HS  . insulin aspart  4 Units Subcutaneous TID WC  . insulin glargine  50 Units Subcutaneous Daily  . metoCLOPramide  5 mg Oral TID AC  . multivitamin  1 tablet Oral QHS  . saccharomyces boulardii  250 mg Oral BID  . traZODone  50 mg Oral QHS    Continuous Infusions:    Inda Coke MS, RD, LDN Inpatient Registered Dietitian Pager: [phone] After-hours pager: [phone]

## 2013-04-09 NOTE — Progress Notes (Signed)
Progress Note  Jasmine [name] ZOX:[id number] DOB: 07-28-1970 DOA: 03/23/2013 PCP: Dois Davenport., MD  Brief narrative: 43 yo with poorly controlled insulin-dependent diabetes mellitus admitted 2/27 for L buttock abscess, sepsis and DKA. Intubated for hypoxemic respiratory failure.Buttock abscess was I/D per surgery. CT abdomen revealed airspace consolidation R > L with air bronchograms suggesting multifocal PNA. Also small bilat effusions and low lung volumes. Txd with Vanco and Zosyn. Cx found positive for strep. Sx's of volume overload persisted so Nephro consulted- initially used high dose IV Lasix but limited response and progressive renal failure. Was transferred to [facility] for CRRT. Stabilized and was able to be extubated. Remains on HD and possibly this will become chronic but still awaiting renal recovery. Pt depressed over current health status so psychiatry consulted  Assessment/Plan: DKA (diabetic ketoacidoses)/DIABETES MELLITUS, TYPE I -resolved  -CBG's has remained in the low 200's now (better) -continue lantus at current dose for now; continue also SSI and meal coverage   Acute-on-chronic kidney injury/ CKD (chronic kidney disease), stage IV -per Nephrology -patient continue to make some urine  -Cr coming down without HD (4.8 on 3/16) -will continue holding HD as recommended by renal service and follow labs -hopping for renal recovery -patient remains oliguric currently  Sepsis -due to PNA and buttock abscess -sepsis physiology has resolved -antibiotic therapy completed -continue wound care as indicated by surgery service -no fever and WBC's trending down -will monitor  Acute respiratory failure with hypoxia: A) Volume Overload B) Strep PNA C) Heart failure, diastolic, chronic D) OSA (obstructive sleep apnea) -PNA initially then volume overload from volume resuscitation in setting of ARF -patient approx 13-14 L off since HD started; plan is to maintain current  volume while waiting for recovery -continue CPAP QHS -patient no longer SOB; currently making some urine now -will continue holding HD for now and follow labs.  Diarrhea -has leukocytosis and low grade fevers -C. Diff neg -continue rocephin -associated to side effects of abx's; will continue probiotic   Hypertension and volume -will be permissive with HTN during kidney recovery -BP and volume was controlled with HD  Gastroparesis -significant nausea explained by gastroparesis and uremia -also could be 2/2 cause of diarrhea -continue reglan to PO TID (well tolerated) -continue PRN zofran  Left buttock abscess/ Abscess, perirectal s/p I&D 03/23/2013; planning another I&D at bedside on 3/16 -cont current wound care and follow rec's from surgery service -Wound culture positive for Proteus -antibiotics completed -no fever -CBC showing WBC's trending down  DEPRESSION and anxiety -Psych has seen patient and recommended klonopin 0.5 mg BID and trazodone 50mg  QHS -no SI and mood stable  Anemia, chronic disease -cont Aranesp -has received 1 unit PRBCs during this admission  -Hgb stable  DVT prophylaxis: Lovenox Code Status: Full code Family Communication: No family at bedside Disposition Plan/Expected LOS: remained inpatient  Consultants: PCCM Nephrology Psychiatry General surgery IR  Procedures: 2/27 incision and drainage left buttock abscess  3/3 right IJ hemodialysis catheter  CULTURES:  2/27 MRCA PCR >>> neg  2/27 Blood >>> neg  2/27 Wound >>> PROTEUS  Antibiotics: Vancomycin 2/27 >>> 3/4  Zosyn 2/27 >>> 3/5  Levaquin 3/1 >>> 3/4  Ceftriaxone 3/5 >>>3/14   HPI/Subjective: Patient denies CP or SOB. No overnight complaints. Patient continue making some urine. Afebrile    Objective: Blood pressure 133/64, pulse 79, temperature 97.1 F (36.2 C), temperature source Oral, resp. rate 18, height 5\' 7"  (1.702 m), weight 115.713 kg (255 lb 1.6 oz), last  menstrual  period 02/26/2013, SpO2 100.00%.  Intake/Output Summary (Last 24 hours) at 04/09/13 1758 Last data filed at 04/09/13 1510  Gross per 24 hour  Intake    358 ml  Output    535 ml  Net   -177 ml     Exam: General: No acute respiratory distress-flat affect; low grade fever Lungs: Clear to auscultation bilaterally without wheezes or crackles, good O2 sat on RA Cardiovascular: Regular rate and rhythm without murmur gallop or rub normal S1 and S2, no peripheral edema or JVD Abdomen: no tender, soft, positive BS Musculoskeletal: No significant cyanosis, clubbing of bilateral lower extremities Neurological: Alert and oriented x 3, moves all extremities x 4 without focal neurological deficits, CN 2-12 intact  Scheduled Meds:  Scheduled Meds: . aspirin  81 mg Oral Daily  . atorvastatin  40 mg Oral q1800  . calcium acetate  667 mg Oral TID WC  . clonazePAM  0.5 mg Oral BID  . collagenase   Topical Daily  . darbepoetin (ARANESP) injection - DIALYSIS  100 mcg Intravenous Q Tue-HD  . enoxaparin (LOVENOX) injection  30 mg Subcutaneous Q24H  . insulin aspart  0-20 Units Subcutaneous TID WC & HS  . insulin aspart  4 Units Subcutaneous TID WC  . insulin glargine  50 Units Subcutaneous Daily  . metoCLOPramide  5 mg Oral TID AC  . multivitamin  1 tablet Oral QHS  . saccharomyces boulardii  250 mg Oral BID  . traZODone  50 mg Oral QHS   Continuous Infusions:   Data Reviewed: Basic Metabolic Panel:  Recent Labs Lab 04/03/13 1202 04/04/13 0530 04/06/13 0900 04/07/13 1606 04/08/13 0547 04/09/13 0450  NA 136* 135* 138 137 135* 134*  K 3.7 3.9 4.1 4.3 4.8 5.2  CL 94* 90* 94* 95* 93* 97  CO2 22 22 23 28 25  17*  GLUCOSE 216* 214* 267* 219* 335* 222*  BUN 76* 36* 23 38* 43* 48*  CREATININE 4.93* 4.13* 4.49* 6.08* 5.82* 4.84*  CALCIUM 9.2 8.9 9.4 9.3 8.9 9.1  PHOS 6.6*  --  2.8  --   --   --    Liver Function Tests:  Recent Labs Lab 04/03/13 1202 04/06/13 0900  ALBUMIN 2.7* 2.9*    CBC:  Recent Labs Lab 04/03/13 1202 04/04/13 0008 04/07/13 0559  WBC 15.5* 19.5* 10.9*  HGB 9.2* 10.1* 10.0*  HCT 28.0* 31.4* 32.1*  MCV 86.7 88.0 91.7  PLT 362 454* 417*   BNP (last 3 results)  Recent Labs  10/07/12 2126 11/01/12 1446 03/24/13 1200  PROBNP 1631.0* 2441.0* 6479.0*   CBG:  Recent Labs Lab 04/08/13 1722 04/08/13 2210 04/09/13 0810 04/09/13 1143 04/09/13 1652  GLUCAP 155* 165* 214* 257* 208*    Recent Results (from the past 240 hour(s))  CLOSTRIDIUM DIFFICILE BY PCR     Status: None   Collection Time    04/04/13  6:03 PM      Result Value Ref Range Status   C difficile by pcr NEGATIVE  NEGATIVE Final  URINE CULTURE     Status: None   Collection Time    04/06/13 10:18 PM      Result Value Ref Range Status   Specimen Description URINE, CLEAN CATCH   Final   Special Requests NONE   Final   Culture  Setup Time     Final   Value: 04/07/2013 04:16     Performed at [facility]   Colony Count  Final   Value: NO GROWTH     Performed at [facility]   Culture     Final   Value: NO GROWTH     Performed at [facility]   Report Status 04/08/2013 FINAL   Final    [place] Pickup [phone]  Time < 30 minutes  04/09/2013, 5:58 PM   LOS: 17 days

## 2013-04-09 NOTE — Progress Notes (Signed)
Subjective: States she is doing well with continued Santyl and hydrotherapy. States she is "ready to go home." No new complaints.  Objective: Vital signs in last 24 hours: Temp:  [98 F (36.7 C)-98.9 F (37.2 C)] 98.6 F (37 C) (03/16 0500) Pulse Rate:  [89-110] 96 (03/16 0500) Resp:  [18-22] 18 (03/16 0500) BP: (117-183)/(60-85) 117/83 mmHg (03/16 0500) SpO2:  [99 %-100 %] 100 % (03/16 0700) Weight:  [255 lb 1.6 oz (115.713 kg)] 255 lb 1.6 oz (115.713 kg) (03/15 2100) Last BM Date: 04/08/13  Intake/Output from previous day: 03/15 0701 - 03/16 0700 In: 718 [P.O.:718] Out: 435 [Urine:435] Intake/Output this shift:    PE: General:  Alert, NAD, pleasant Buttock: Small open wound on left buttock surrounded by pressure ulcer approx 3.5cm by 3.5cm.  Healthy red tissue visible around circumference, but very tender on palpation and seems to extend into dermis.  Unstageable currently due to eschar.  Open wound minimally visible in picture below; located at most medial portion of wound with necrotic slough.        Lab Results:   Recent Labs  04/07/13 0559  WBC 10.9*  HGB 10.0*  HCT 32.1*  PLT 417*   BMET  Recent Labs  04/08/13 0547 04/09/13 0450  NA 135* 134*  K 4.8 5.2  CL 93* 97  CO2 25 17*  GLUCOSE 335* 222*  BUN 43* 48*  CREATININE 5.82* 4.84*  CALCIUM 8.9 9.1   PT/INR No results found for this basename: LABPROT, INR,  in the last 72 hours CMP     Component Value Date/Time   NA 134* 04/09/2013 0450   K 5.2 04/09/2013 0450   CL 97 04/09/2013 0450   CO2 17* 04/09/2013 0450   GLUCOSE 222* 04/09/2013 0450   BUN 48* 04/09/2013 0450   CREATININE 4.84* 04/09/2013 0450   CALCIUM 9.1 04/09/2013 0450   PROT 6.9 10/11/2012 0400   ALBUMIN 2.9* 04/06/2013 0900   AST 12 10/11/2012 0400   ALT 11 10/11/2012 0400   ALKPHOS 117 10/11/2012 0400   BILITOT 0.2* 10/11/2012 0400   GFRNONAA 10* 04/09/2013 0450   GFRAA 12* 04/09/2013 0450   Lipase     Component Value Date/Time     LIPASE 23 08/05/2009 1323       Studies/Results: No results found.  Anti-infectives: Anti-infectives   Start     Dose/Rate Route Frequency Ordered Stop   04/06/13 1600  cefTRIAXone (ROCEPHIN) 2 g in dextrose 5 % 50 mL IVPB     2 g 100 mL/hr over 30 Minutes Intravenous Every 24 hours 04/05/13 1730 04/07/13 1832   04/03/13 1500  cefTRIAXone (ROCEPHIN) 2 g in dextrose 5 % 50 mL IVPB     2 g 100 mL/hr over 30 Minutes Intravenous Every 24 hours 04/03/13 1023 04/05/13 1641   03/29/13 1500  cefTRIAXone (ROCEPHIN) 2 g in dextrose 5 % 50 mL IVPB  Status:  Discontinued     2 g 100 mL/hr over 30 Minutes Intravenous Every 24 hours 03/29/13 1439 04/03/13 1023   03/28/13 2100  vancomycin (VANCOCIN) 1,250 mg in sodium chloride 0.9 % 250 mL IVPB  Status:  Discontinued     1,250 mg 166.7 mL/hr over 90 Minutes Intravenous Every 24 hours 03/28/13 1120 03/28/13 1125   03/28/13 2000  Levofloxacin (LEVAQUIN) IVPB 250 mg  Status:  Discontinued     250 mg 50 mL/hr over 60 Minutes Intravenous Every 24 hours 03/28/13 1118 03/28/13 1125   03/27/13 2000  vancomycin (  VANCOCIN) 1,750 mg in sodium chloride 0.9 % 500 mL IVPB  Status:  Discontinued     1,750 mg 250 mL/hr over 120 Minutes Intravenous Every 48 hours 03/26/13 0931 03/28/13 1120   03/25/13 1800  levofloxacin (LEVAQUIN) IVPB 750 mg  Status:  Discontinued     750 mg 100 mL/hr over 90 Minutes Intravenous Every 48 hours 03/25/13 1625 03/28/13 1116   03/24/13 2000  vancomycin (VANCOCIN) 1,750 mg in sodium chloride 0.9 % 500 mL IVPB  Status:  Discontinued     1,750 mg 250 mL/hr over 120 Minutes Intravenous Every 24 hours 03/23/13 1903 03/26/13 0931   03/23/13 2000  piperacillin-tazobactam (ZOSYN) IVPB 3.375 g  Status:  Discontinued     3.375 g 12.5 mL/hr over 240 Minutes Intravenous 3 times per day 03/23/13 1903 03/28/13 1123   03/23/13 2000  vancomycin (VANCOCIN) 2,000 mg in sodium chloride 0.9 % 500 mL IVPB     2,000 mg 250 mL/hr over 120 Minutes  Intravenous  Once 03/23/13 1903 03/24/13 0005   03/23/13 1345  clindamycin (CLEOCIN) IVPB 600 mg     600 mg 100 mL/hr over 30 Minutes Intravenous  Once 03/23/13 1338 03/23/13 1514       Assessment/Plan Buttock abscess, s/p I&D by EDP Pressure wound   Comorbidities including DM Type I, CHF, HTN and more.  1. Continue Santyl and hydrotherapy 2. Consult with surgeon reference possible further I&D (Bedside vs OR) 3. Lovenox for VTE prohpylaxis. 4. Ambulate as able. 5. Other comorbidities managed by medicine   LOS: 17 days    Michael A. Dion [employer] 04/09/2013, 8:55 AM [facility] Phone #: [phone]  ----------------------------------------------------------------------------------------------------------- General Surgery PA Preceptor Note:  I agree with the above PA students findings as above.  Made changes as needed.  Aris Georgia, PA-C General Surgery [facility] Pager: [phone] Office: [phone]

## 2013-04-10 LAB — GLUCOSE, CAPILLARY
GLUCOSE-CAPILLARY: 214 mg/dL — AB (ref 70–99)
Glucose-Capillary: 256 mg/dL — ABNORMAL HIGH (ref 70–99)
Glucose-Capillary: 181 mg/dL — ABNORMAL HIGH (ref 70–99)
Glucose-Capillary: 317 mg/dL — ABNORMAL HIGH (ref 70–99)

## 2013-04-10 LAB — BASIC METABOLIC PANEL
BUN: 57 mg/dL — ABNORMAL HIGH (ref 6–23)
CHLORIDE: 96 meq/L (ref 96–112)
CO2: 24 mEq/L (ref 19–32)
Calcium: 9.2 mg/dL (ref 8.4–10.5)
Creatinine, Ser: 4.35 mg/dL — ABNORMAL HIGH (ref 0.50–1.10)
GFR calc non Af Amer: 12 mL/min — ABNORMAL LOW (ref >90)
GFR, EST AFRICAN AMERICAN: 13 mL/min — AB (ref >90)
GLUCOSE: 284 mg/dL — AB (ref 70–99)
POTASSIUM: 4.7 meq/L (ref 3.7–5.3)
Sodium: 136 mEq/L — ABNORMAL LOW (ref 137–147)

## 2013-04-10 MED ORDER — FUROSEMIDE 80 MG PO TABS
80.0000 mg | ORAL_TABLET | Freq: Every day | ORAL | Status: DC
  Administered 2013-04-10 – 2013-04-13 (×4): 80 mg via ORAL
  Filled 2013-04-10 (×5): qty 1

## 2013-04-10 MED ORDER — INSULIN GLARGINE 100 UNIT/ML ~~LOC~~ SOLN
60.0000 [IU] | Freq: Every day | SUBCUTANEOUS | Status: DC
  Administered 2013-04-10 – 2013-04-12 (×3): 60 [IU] via SUBCUTANEOUS
  Filled 2013-04-10 (×4): qty 0.6

## 2013-04-10 NOTE — Progress Notes (Signed)
Physical Therapy Treatment Patient Details Name: Jasmine [name] MRN: [id number] DOB: [birth year] Today's Date: 04/10/2013 Time: [id number] PT Time Calculation (min): 26 min  PT Assessment / Plan / Recommendation  History of Present Illness improving in mobility   PT Comments   Pt would like to have a straight cane for stability at home.  Recommend pt walk ad lib with nursing staff  Follow Up Recommendations  Home health PT;Supervision/Assistance - 24 hour     Does the patient have the potential to tolerate intense rehabilitation     Barriers to Discharge        Equipment Recommendations  Cane    Recommendations for Other Services OT consult  Frequency Min 3X/week   Progress towards PT Goals Progress towards PT goals: Progressing toward goals  Plan Current plan remains appropriate    Precautions / Restrictions Precautions Precautions: Fall Restrictions Weight Bearing Restrictions: No   Pertinent Vitals/Pain Pt reports pain in LEs did not rate    Mobility  Bed Mobility General bed mobility comments: NT patient up in chair Transfers Overall transfer level: Modified independent Equipment used: None Transfers: Sit to/from Stand Sit to Stand: Modified independent (Device/Increase time) General transfer comment: reports up in room on her own Ambulation/Gait Ambulation/Gait assistance: Supervision Assistive device: None Gait Pattern/deviations: Wide base of support;Shuffle Gait velocity: decreased General Gait Details: "I waddle" Pt reports she has special insoles for foot protection. Pt noted to have swelling in both LE's L>R  recommended that patient get some compression hose, especailly for work.  Suggested that patient consider thigh high.  Pt needed 2 standing rest breaks during amublation    Exercises General Exercises - Lower Extremity Ankle Circles/Pumps: AROM;Both;5 reps;Seated Gluteal Sets: AROM;Both;5 reps;Standing Hip ABduction/ADduction: AROM;Left;5  reps;Standing Hip Flexion/Marching: AROM;Left;10 reps;Standing Heel Raises: AROM;Both;5 reps;Standing Mini-Sqauts: AROM;Both;5 reps;Standing Other Exercises Other Exercises: deep abdominal breathing.   PT Diagnosis:    PT Problem List:   PT Treatment Interventions:     PT Goals (current goals can now be found in the care plan section)    Visit Information  Last PT Received On: 04/10/13 Assistance Needed: +1 History of Present Illness: improving in mobility    Subjective Data      Cognition  Cognition Arousal/Alertness: Awake/alert Behavior During Therapy: WFL for tasks assessed/performed Overall Cognitive Status: Within Functional Limits for tasks assessed    Balance  Balance Overall balance assessment: Modified Independent Sitting-balance support: Feet supported;No upper extremity supported Sitting balance-Leahy Scale: Normal Standing balance-Leahy Scale: Good Standing balance comment: pt reports that the cane "helps"  End of Session PT - End of Session Activity Tolerance: Patient limited by fatigue Patient left: in chair;with call bell/phone within reach Nurse Communication: Mobility status   GP    Rosey Batheresa K. [place]Brown, [place] CarolinaPT [phone] 04/10/2013, 9:46 AM

## 2013-04-10 NOTE — Progress Notes (Signed)
Much cleaner.  Cont wound care.

## 2013-04-10 NOTE — Progress Notes (Signed)
Progress Note  Rosha L Weninger ZOX:[id number] DOB: 1970/02/18 DOA: 03/23/2013 PCP: Dois DavenportICHTER,KAREN L., MD  Brief narrative29: 43 yo with poorly controlled insulin-dependent diabetes mellitus admitted 2/27 for L buttock abscess, sepsis and DKA. Intubated for hypoxemic respiratory failure.Buttock abscess was I/D per surgery. CT abdomen revealed airspace consolidation R > L with air bronchograms suggesting multifocal PNA. Also small bilat effusions and low lung volumes. Txd with Vanco and Zosyn. Cx found positive for strep. Sx's of volume overload persisted so Nephro consulted- initially used high dose IV Lasix but limited response and progressive renal failure. [facility]. Stabilized and was able to be extubated. Remains on HD until 3/14. Patient started to make urine and Cr/BUN trending down w/o HD. Currently plan is for daily labs, continue hold HD and follow surgery service rec's regarding wound care and [facility].  Assessment/Plan: DKA (diabetic ketoacidoses)/DIABETES MELLITUS, TYPE I -resolved  -CBG's slowly improving, but still elevated. -continue lantus (will increase to 60 units); continue also SSI and meal coverage  -patient low carb diet  Acute-on-chronic kidney injury/ CKD (chronic kidney disease), stage IV -per Nephrology -patient continue to make some urine  -Cr coming down without HD (4.35 on 3/17) -will continue holding HD as recommended by renal service and follow labs; if Cr continue to go down, plan is to remove HD catheter  -hopping for renal recovery  Sepsis -due to PNA and buttock abscess -sepsis physiology has resolved -antibiotic therapy completed -continue wound care as indicated by surgery service -no fever and WBC's trending down/pretty much back to normal range -will monitor  Acute respiratory failure with hypoxia: A) Volume Overload B) Strep PNA C) Heart failure, diastolic, chronic D) OSA (obstructive sleep  apnea) -PNA initially, then subsequent volume overload from volume resuscitation in setting of ARF -patient approx 13-14 L off since HD started; and now building up some extra fluid. Will restart lasix as recommended by renal service -continue CPAP QHS -patient w/o SOB at this point  Diarrhea -has leukocytosis and low grade fevers -C. Diff neg -continue rocephin -associated to side effects of abx's; will continue probiotic   Hypertension and volume -will be permissive with HTN during kidney recovery -BP and volume was controlled with HD -will restart lasix  Gastroparesis -significant nausea explained by gastroparesis and uremia -also could be the cause of diarrhea -continue reglan to PO TID (well tolerated) -continue PRN zofran  Left buttock abscess/ Abscess, perirectal s/p I&D 03/23/2013; planning another I&D at bedside on 3/16 -cont current wound care and follow rec's from surgery service -Wound culture positive for Proteus -antibiotics completed -no fever -CBC showing WBC's trending down  DEPRESSION and anxiety -Psych has seen patient and recommended klonopin 0.5 mg BID and trazodone 50mg  QHS -no SI and mood stable  Anemia, chronic disease -cont Aranesp -has received 1 unit PRBCs during this admission  -Hgb stable  DVT prophylaxis: Lovenox Code Status: Full code Family Communication: No family at bedside Disposition Plan/Expected LOS: remained inpatient  Consultants: PCCM Nephrology Psychiatry General surgery IR  Procedures: 2/27 incision and drainage left buttock abscess  3/3 right IJ hemodialysis catheter  CULTURES:  2/27 MRCA PCR >>> neg  2/27 Blood >>> neg  2/27 Wound >>> PROTEUS  Antibiotics: Vancomycin 2/27 >>> 3/4  Zosyn 2/27 >>> 3/5  Levaquin 3/1 >>> 3/4  Ceftriaxone 3/5 >>>3/14   HPI/Subjective: Patient denies CP or SOB. No overnight complaints. Patient continue making some urine. Has remained Afebrile  Objective: Blood pressure  128/68, pulse 88, temperature 98.2 F (36.8 C), temperature source Oral, resp. rate 18, height 5\' 7"  (1.702 m), weight 117.3 kg (258 lb 9.6 oz), last menstrual period 02/26/2013, SpO2 98.00%.  Intake/Output Summary (Last 24 hours) at 04/10/13 1708 Last data filed at 04/10/13 1300  Gross per 24 hour  Intake    960 ml  Output    500 ml  Net    460 ml     Exam: General: No acute respiratory distress-flat affect; low grade fever Lungs: Clear to auscultation bilaterally without wheezes or crackles, good O2 sat on RA Cardiovascular: Regular rate and rhythm without murmur gallop or rub normal S1 and S2, no peripheral edema or JVD Abdomen: no tender, soft, positive BS Musculoskeletal: No significant cyanosis, clubbing of bilateral lower extremities Neurological: Alert and oriented x 3, moves all extremities x 4 without focal neurological deficits, CN 2-12 intact  Scheduled Meds:  Scheduled Meds: . aspirin  81 mg Oral Daily  . atorvastatin  40 mg Oral q1800  . calcium acetate  667 mg Oral TID WC  . clonazePAM  0.5 mg Oral BID  . collagenase   Topical Daily  . darbepoetin (ARANESP) injection - DIALYSIS  100 mcg Intravenous Q Tue-HD  . enoxaparin (LOVENOX) injection  30 mg Subcutaneous Q24H  . furosemide  80 mg Oral Daily  . insulin aspart  0-20 Units Subcutaneous TID WC & HS  . insulin aspart  4 Units Subcutaneous TID WC  . insulin glargine  50 Units Subcutaneous Daily  . metoCLOPramide  5 mg Oral TID AC  . multivitamin  1 tablet Oral QHS  . saccharomyces boulardii  250 mg Oral BID  . traZODone  50 mg Oral QHS   Continuous Infusions:   Data Reviewed: Basic Metabolic Panel:  Recent Labs Lab 04/06/13 0900 04/07/13 1606 04/08/13 0547 04/09/13 0450 04/10/13 1027  NA 138 137 135* 134* 136*  K 4.1 4.3 4.8 5.2 4.7  CL 94* 95* 93* 97 96  CO2 23 28 25  17* 24  GLUCOSE 267* 219* 335* 222* 284*  BUN 23 38* 43* 48* 57*  CREATININE 4.49* 6.08* 5.82* 4.84* 4.35*  CALCIUM 9.4 9.3 8.9  9.1 9.2  PHOS 2.8  --   --   --   --    Liver Function Tests:  Recent Labs Lab 04/06/13 0900  ALBUMIN 2.9*   CBC:  Recent Labs Lab 04/04/13 0008 04/07/13 0559  WBC 19.5* 10.9*  HGB 10.1* 10.0*  HCT 31.4* 32.1*  MCV 88.0 91.7  PLT 454* 417*   BNP (last 3 results)  Recent Labs  10/07/12 2126 11/01/12 1446 03/24/13 1200  PROBNP 1631.0* 2441.0* 6479.0*   CBG:  Recent Labs Lab 04/09/13 1143 04/09/13 1652 04/09/13 2201 04/10/13 0842 04/10/13 1124  GLUCAP 257* 208* 225* 256* 214*    Recent Results (from the past 240 hour(s))  CLOSTRIDIUM DIFFICILE BY PCR     Status: None   Collection Time    04/04/13  6:03 PM      Result Value Ref Range Status   C difficile by pcr NEGATIVE  NEGATIVE Final  URINE CULTURE     Status: None   Collection Time    04/06/13 10:18 PM      Result Value Ref Range Status   Specimen Description URINE, CLEAN CATCH   Final   Special Requests NONE   Final   Culture  Setup Time     Final   Value: 04/07/2013  04:16     Performed at [employer] [facility] Count     Final   Value: NO GROWTH     Performed at [facility]   Culture     Final   Value: NO GROWTH     Performed at [facility]   Report Status 04/08/2013 FINAL   Final    [facility] Pickup [phone]  Time < 30 minutes  04/10/2013, 5:08 PM   LOS: 18 days

## 2013-04-10 NOTE — Progress Notes (Signed)
Patient ID: Jasmine [name], female   DOB: 09-03-1970, 43 y.o.   MRN: [id number]  [facility] Progress Note    Assessment/ Plan:   1 Acute on CKD 3- fair (lower than the past 24hr) urine output overnight, labs pending to assess renal function . No acute dialysis needs today. If labs from today show continued renal recovery- will discontinue her dialysis catheter. Restart furosemide given concerns with edema-started the lower dose of 80 mg once a day (compared to the 80 mg twice a day which was taking before). 2 CKD stage 3 (baseline creat 1.8-2.1) : Appears to be from underlying diabetes/hypertension, continue to monitor  3 DM w multiple complications  4 L buttock abcess s/p I&D : Seen by surgery PA, unlikely to need further surgery in appears to be suitable for outpatient wound care. 5 Anemia on darbe 100/wk , no overt losses  6 HTN was on 3 BP meds at home, none here-anticipate to increase status post recovery from the polyuric ATN recovery phase   Subjective:   Reports to be feeling better, excited that she will not need further surgery. Concern that she is having swelling in her arms and legs (formerly on furosemide 80 mg twice a day)    Objective:   BP 135/76  Pulse 95  Temp(Src) 98.8 F (37.1 C) (Oral)  Resp 18  Ht 5\' 7"  (1.702 m)  Wt 117.3 kg (258 lb 9.6 oz)  BMI 40.49 kg/m2  SpO2 98%  LMP 02/26/2013  Intake/Output Summary (Last 24 hours) at 04/10/13 1024 Last data filed at 04/10/13 0900  Gross per 24 hour  Intake    960 ml  Output    850 ml  Net    110 ml   Weight change: 1.588 kg (3 lb 8 oz)  Physical Exam: Gen: Comfortably resting up in her recliner CVS: Pulse regular in rate and rhythm, S1 and S2 with an ejection systolic murmur Resp: Clear to auscultation, no rales/rhonchi Abd: Soft, obese, nontender Ext: 1-2+ edema lower extremities  Imaging: No results found.  Labs: BMET  Recent Labs Lab 04/03/13 1202 04/04/13 0530 04/06/13 0900  04/07/13 1606 04/08/13 0547 04/09/13 0450  NA 136* 135* 138 137 135* 134*  K 3.7 3.9 4.1 4.3 4.8 5.2  CL 94* 90* 94* 95* 93* 97  CO2 22 22 23 28 25  17*  GLUCOSE 216* 214* 267* 219* 335* 222*  BUN 76* 36* 23 38* 43* 48*  CREATININE 4.93* 4.13* 4.49* 6.08* 5.82* 4.84*  CALCIUM 9.2 8.9 9.4 9.3 8.9 9.1  PHOS 6.6*  --  2.8  --   --   --    CBC  Recent Labs Lab 04/03/13 1202 04/04/13 0008 04/07/13 0559  WBC 15.5* 19.5* 10.9*  HGB 9.2* 10.1* 10.0*  HCT 28.0* 31.4* 32.1*  MCV 86.7 88.0 91.7  PLT 362 454* 417*    Medications:    . aspirin  81 mg Oral Daily  . atorvastatin  40 mg Oral q1800  . calcium acetate  667 mg Oral TID WC  . clonazePAM  0.5 mg Oral BID  . collagenase   Topical Daily  . darbepoetin (ARANESP) injection - DIALYSIS  100 mcg Intravenous Q Tue-HD  . enoxaparin (LOVENOX) injection  30 mg Subcutaneous Q24H  . insulin aspart  0-20 Units Subcutaneous TID WC & HS  . insulin aspart  4 Units Subcutaneous TID WC  . insulin glargine  50 Units Subcutaneous Daily  . metoCLOPramide  5 mg Oral  TID AC  . multivitamin  1 tablet Oral QHS  . saccharomyces boulardii  250 mg Oral BID  . traZODone  50 mg Oral QHS   Zetta Bills, MD 04/10/2013, 10:24 AM

## 2013-04-10 NOTE — Progress Notes (Signed)
Physical Therapy Wound Treatment Patient Details  Name: Jasmine [name] MRN: [id number] Date of Birth: Jan 21, 1971  Today's Date: 04/10/2013 Time: 1200-1223 Time Calculation (min): 23 min  Subjective  Subjective: Pt states she doesn't know when she will get to go home.  Pain Score:  Premedicated.  Wound Assessment  Wound / Incision (Open or Dehisced) 03/23/13 Other (Comment) Buttocks Left abscess, I&D performed today (Active)  Dressing Type Moist to dry;Gauze; santyl (Comment);Barrier Film (skin prep) 04/10/2013  1:48 PM  Dressing Changed Changed 04/10/2013  1:48 PM  Dressing Status Clean;Dry;Intact 04/10/2013  1:48 PM  Dressing Change Frequency Twice a day 04/10/2013  1:48 PM  Site / Wound Assessment Yellow;Red;Pink 04/10/2013  1:48 PM  % Wound base Red or Granulating 70% 04/10/2013  1:48 PM  % Wound base Yellow 30% 04/10/2013  1:48 PM  % Wound base Black 0% 04/10/2013  1:48 PM  % Wound base Other (Comment) 0% 04/10/2013  1:48 PM  Peri-wound Assessment Intact 04/10/2013  1:48 PM  Wound Length (cm) 5.5 cm 04/06/2013  3:18 PM  Wound Width (cm) 4.4 cm 04/06/2013  3:18 PM  Wound Depth (cm) 2.2 cm 04/06/2013  3:18 PM  Margins Unattached edges (unapproximated) 04/10/2013  1:48 PM  Closure None 04/10/2013  1:48 PM  Drainage Amount Minimal 04/10/2013  1:48 PM  Drainage Description Serous 04/10/2013  1:48 PM  Non-staged Wound Description Not applicable 07/22/3660  9:47 PM  Treatment Debridement (Selective);Hydrotherapy (Pulse lavage);Packing (Saline gauze) 04/10/2013  1:48 PM   Hydrotherapy Pulsed lavage therapy - wound location: Lt buttock Pulsed Lavage with Suction (psi): 8 psi Pulsed Lavage with Suction - Normal Saline Used: 500 mL Pulsed Lavage Tip: Tip with splash shield Selective Debridement Selective Debridement - Location: Lt buttock Selective Debridement - Tools Used: Forceps;Scissors Selective Debridement - Tissue Removed: yellow slough   Wound Assessment and Plan  Wound Therapy -  Assess/Plan/Recommendations Wound Therapy - Clinical Statement: Wound looking much improved after surgical debridement. Hydrotherapy Plan: Debridement;Dressing change;Patient/family education;Pulsatile lavage with suction Wound Therapy - Frequency: 6X / week Wound Therapy - Follow Up Recommendations: Home health RN Wound Plan: see above  Wound Therapy Goals- Improve the function of patient's integumentary system by progressing the wound(s) through the phases of wound healing (inflammation - proliferation - remodeling) by: Decrease Necrotic Tissue to: 10 Decrease Necrotic Tissue - Progress: Updated due to goal met Increase Granulation Tissue to: 90 Increase Granulation Tissue - Progress: Updated due to goal met Improve Drainage Characteristics - Progress: Met Patient/Family Instruction Goal - Progress: Progressing toward goal Goals/treatment plan/discharge plan were made with and agreed upon by patient/family: Yes Time For Goal Achievement: 7 days Wound Therapy - Potential for Goals: Good  Goals will be updated until maximal potential achieved or discharge criteria met.  Discharge criteria: when goals achieved, discharge from hospital, MD decision/surgical intervention, no progress towards goals, refusal/missing three consecutive treatments without notification or medical reason.  GP     Elisabeth Strom 04/10/2013, 1:51 PM  [place]

## 2013-04-10 NOTE — Procedures (Signed)
Agree.  Start wound care.

## 2013-04-10 NOTE — Progress Notes (Signed)
Subjective: No new c/o. States she was a little uncomfortable/sore overnight after her debridement yesterday.  Objective: Vital signs in last 24 hours: Temp:  [97.1 F (36.2 C)-99.1 F (37.3 C)] 98.8 F (37.1 C) (03/17 0519) Pulse Rate:  [79-99] 93 (03/17 0519) Resp:  [18-19] 18 (03/17 0519) BP: (124-152)/(60-78) 152/75 mmHg (03/17 0519) SpO2:  [99 %-100 %] 99 % (03/17 0519) Weight:  [258 lb 9.6 oz (117.3 kg)] 258 lb 9.6 oz (117.3 kg) (03/16 2203) Last BM Date: 04/09/13  Intake/Output from previous day: 03/16 0701 - 03/17 0700 In: 960 [P.O.:960] Out: 850 [Urine:850] Intake/Output this shift:    PE: Gen:  Alert, NAD, pleasant Buttock: Stage III pressure ulcer measuring approx 3.5 cm in diameter x 2.5cm deep.  Healthy tissue around edges, with no obvious granulation formation yet.  Some yellow-tan slough visible in wound, approx 30% of wound bed.  Minimally tender.      Lab Results:  No results found for this basename: WBC, HGB, HCT, PLT,  in the last 72 hours BMET  Recent Labs  04/08/13 0547 04/09/13 0450  NA 135* 134*  K 4.8 5.2  CL 93* 97  CO2 25 17*  GLUCOSE 335* 222*  BUN 43* 48*  CREATININE 5.82* 4.84*  CALCIUM 8.9 9.1   PT/INR No results found for this basename: LABPROT, INR,  in the last 72 hours CMP     Component Value Date/Time   NA 134* 04/09/2013 0450   K 5.2 04/09/2013 0450   CL 97 04/09/2013 0450   CO2 17* 04/09/2013 0450   GLUCOSE 222* 04/09/2013 0450   BUN 48* 04/09/2013 0450   CREATININE 4.84* 04/09/2013 0450   CALCIUM 9.1 04/09/2013 0450   PROT 6.9 10/11/2012 0400   ALBUMIN 2.9* 04/06/2013 0900   AST 12 10/11/2012 0400   ALT 11 10/11/2012 0400   ALKPHOS 117 10/11/2012 0400   BILITOT 0.2* 10/11/2012 0400   GFRNONAA 10* 04/09/2013 0450   GFRAA 12* 04/09/2013 0450   Lipase     Component Value Date/Time   LIPASE 23 08/05/2009 1323       Studies/Results: No results found.  Anti-infectives: Anti-infectives   Start     Dose/Rate Route  Frequency Ordered Stop   04/06/13 1600  cefTRIAXone (ROCEPHIN) 2 g in dextrose 5 % 50 mL IVPB     2 g 100 mL/hr over 30 Minutes Intravenous Every 24 hours 04/05/13 1730 04/07/13 1832   04/03/13 1500  cefTRIAXone (ROCEPHIN) 2 g in dextrose 5 % 50 mL IVPB     2 g 100 mL/hr over 30 Minutes Intravenous Every 24 hours 04/03/13 1023 04/05/13 1641   03/29/13 1500  cefTRIAXone (ROCEPHIN) 2 g in dextrose 5 % 50 mL IVPB  Status:  Discontinued     2 g 100 mL/hr over 30 Minutes Intravenous Every 24 hours 03/29/13 1439 04/03/13 1023   03/28/13 2100  vancomycin (VANCOCIN) 1,250 mg in sodium chloride 0.9 % 250 mL IVPB  Status:  Discontinued     1,250 mg 166.7 mL/hr over 90 Minutes Intravenous Every 24 hours 03/28/13 1120 03/28/13 1125   03/28/13 2000  Levofloxacin (LEVAQUIN) IVPB 250 mg  Status:  Discontinued     250 mg 50 mL/hr over 60 Minutes Intravenous Every 24 hours 03/28/13 1118 03/28/13 1125   03/27/13 2000  vancomycin (VANCOCIN) 1,750 mg in sodium chloride 0.9 % 500 mL IVPB  Status:  Discontinued     1,750 mg 250 mL/hr over 120 Minutes Intravenous Every 48 hours  03/26/13 0931 03/28/13 1120   03/25/13 1800  levofloxacin (LEVAQUIN) IVPB 750 mg  Status:  Discontinued     750 mg 100 mL/hr over 90 Minutes Intravenous Every 48 hours 03/25/13 1625 03/28/13 1116   03/24/13 2000  vancomycin (VANCOCIN) 1,750 mg in sodium chloride 0.9 % 500 mL IVPB  Status:  Discontinued     1,750 mg 250 mL/hr over 120 Minutes Intravenous Every 24 hours 03/23/13 1903 03/26/13 0931   03/23/13 2000  piperacillin-tazobactam (ZOSYN) IVPB 3.375 g  Status:  Discontinued     3.375 g 12.5 mL/hr over 240 Minutes Intravenous 3 times per day 03/23/13 1903 03/28/13 1123   03/23/13 2000  vancomycin (VANCOCIN) 2,000 mg in sodium chloride 0.9 % 500 mL IVPB     2,000 mg 250 mL/hr over 120 Minutes Intravenous  Once 03/23/13 1903 03/24/13 0005   03/23/13 1345  clindamycin (CLEOCIN) IVPB 600 mg     600 mg 100 mL/hr over 30 Minutes  Intravenous  Once 03/23/13 1338 03/23/13 1514     Clindamycin 03/23/12 Vancomycin 03/23/12 >> 03/28/13 Zosyn 03/23/12 >> 03/28/13 Rocephin 03/29/13 >> 04/07/13  Assessment/Plan Buttock abscess, s/p I&D by EDP  Stage III Pressure wound, s/p I&D   Comorbidities including DM Type I, CHF, HTN and more.  1. Continue Santyl and hydrotherapy  2. Wet to dry dressing changes BID 3. Lovenox for VTE prohpylaxis.  4. Ambulate as able. May benefit from doughnut pad which she can get from a medical supply. 5. Other comorbidities managed by medicine. 6. Pt had questions about diet and recommend dietician come back by. 7. Not currently on any ABX. Dispo: Surgically stable [facility].  Will need training for self/family for BID Wet-Dry dressing changes to be continued until fully healed.  Follow up in our clinic in 3 weeks.   LOS: 18 days    Michael A. Dion Saucierillery, PA-S [employer] 04/10/2013, 8:27 AM [facility] Phone #: [phone]   ----------------------------------------------------------------------------------------------------------- General Surgery PA Preceptor Note:  I agree with the above PA students findings as above.    Aris GeorgiaMegan Dort, PA-C [facility] Surgery [facility] Pager: [phone] Office: [phone]

## 2013-04-11 LAB — BASIC METABOLIC PANEL
BUN: 60 mg/dL — ABNORMAL HIGH (ref 6–23)
CO2: 25 mEq/L (ref 19–32)
CREATININE: 4 mg/dL — AB (ref 0.50–1.10)
Calcium: 9.2 mg/dL (ref 8.4–10.5)
Chloride: 100 mEq/L (ref 96–112)
GFR, EST AFRICAN AMERICAN: 15 mL/min — AB (ref >90)
GFR, EST NON AFRICAN AMERICAN: 13 mL/min — AB (ref >90)
Glucose, Bld: 85 mg/dL (ref 70–99)
Potassium: 4.7 mEq/L (ref 3.7–5.3)
Sodium: 141 mEq/L (ref 137–147)

## 2013-04-11 LAB — GLUCOSE, CAPILLARY
GLUCOSE-CAPILLARY: 84 mg/dL (ref 70–99)
Glucose-Capillary: 138 mg/dL — ABNORMAL HIGH (ref 70–99)
Glucose-Capillary: 110 mg/dL — ABNORMAL HIGH (ref 70–99)
Glucose-Capillary: 136 mg/dL — ABNORMAL HIGH (ref 70–99)

## 2013-04-11 LAB — CBC
HCT: 33.4 % — ABNORMAL LOW (ref 36.0–46.0)
Hemoglobin: 10.4 g/dL — ABNORMAL LOW (ref 12.0–15.0)
MCH: 28.5 pg (ref 26.0–34.0)
MCHC: 31.1 g/dL (ref 30.0–36.0)
MCV: 91.5 fL (ref 78.0–100.0)
PLATELETS: 446 10*3/uL — AB (ref 150–400)
RBC: 3.65 MIL/uL — ABNORMAL LOW (ref 3.87–5.11)
RDW: 14.8 % (ref 11.5–15.5)
WBC: 9.1 10*3/uL (ref 4.0–10.5)

## 2013-04-11 NOTE — Progress Notes (Signed)
Physical Therapy Wound Treatment Patient Details  Name: Jasmine [name] MRN: [id number] Date of Birth: 31-Jul-1970  Today's Date: 04/11/2013 Time: 1012-1030 Time Calculation (min): 18 min  Subjective  Subjective: Pt reports she has been here 3 weeks.  Pain Score: Pain Score: 4   Wound Assessment  Wound / Incision (Open or Dehisced) 03/23/13 Other (Comment) Buttocks Left abscess, I&D performed today (Active)  Dressing Type Moist to dry;Gauze (Comment);Barrier Film (skin prep);Other (Comment) 04/11/2013 11:32 AM  Dressing Changed Changed 04/11/2013 11:32 AM  Dressing Status Clean;Dry;Intact 04/11/2013 11:32 AM  Dressing Change Frequency Twice a day 04/11/2013 11:32 AM  Site / Wound Assessment Yellow;Red;Pink 04/11/2013 11:32 AM  % Wound base Red or Granulating 85% 04/11/2013 11:32 AM  % Wound base Yellow 15% 04/11/2013 11:32 AM  % Wound base Black 0% 04/11/2013 11:32 AM  % Wound base Other (Comment) 0% 04/11/2013 11:32 AM  Peri-wound Assessment Intact 04/11/2013 11:32 AM  Wound Length (cm) 5.5 cm 04/06/2013  3:18 PM  Wound Width (cm) 4.4 cm 04/06/2013  3:18 PM  Wound Depth (cm) 2.2 cm 04/06/2013  3:18 PM  Undermining (cm) 3 cm 5 o'clock to 7 o'clock 04/11/2013 11:32 AM  Margins Unattached edges (unapproximated) 04/11/2013 11:32 AM  Closure None 04/11/2013 11:32 AM  Drainage Amount Minimal 04/11/2013  7:58 AM  Drainage Description Serous 04/11/2013 11:32 AM  Non-staged Wound Description Not applicable 4/33/2951  8:84 PM  Treatment Debridement (Selective);Hydrotherapy (Pulse lavage);Packing (Saline gauze) 04/11/2013 11:32 AM   Hydrotherapy Pulsed lavage therapy - wound location: Lt buttock Pulsed Lavage with Suction (psi): 8 psi Pulsed Lavage with Suction - Normal Saline Used: 1000 mL Pulsed Lavage Tip: Tip with splash shield Selective Debridement Selective Debridement - Location: Lt buttock Selective Debridement - Tools Used: Forceps;Scissors Selective Debridement - Tissue Removed: yellow slough    Wound Assessment and Plan  Wound Therapy - Assess/Plan/Recommendations Wound Therapy - Clinical Statement: Wound continues to improve. Hydrotherapy Plan: Debridement;Dressing change;Patient/family education;Pulsatile lavage with suction Wound Therapy - Frequency: 6X / week Wound Therapy - Follow Up Recommendations: Home health RN Wound Plan: see above  Wound Therapy Goals- Improve the function of patient's integumentary system by progressing the wound(s) through the phases of wound healing (inflammation - proliferation - remodeling) by: Decrease Necrotic Tissue to: 10 Decrease Necrotic Tissue - Progress: Progressing toward goal Increase Granulation Tissue to: 90 Increase Granulation Tissue - Progress: Progressing toward goal Patient/Family will be able to : verbalize appropriate positioning and pressure relief to promote wound healing. Patient/Family Instruction Goal - Progress: Progressing toward goal  Goals will be updated until maximal potential achieved or discharge criteria met.  Discharge criteria: when goals achieved, discharge from hospital, MD decision/surgical intervention, no progress towards goals, refusal/missing three consecutive treatments without notification or medical reason.  GP     Finley Chevez 04/11/2013, 11:36 AM  Suanne Marker PT [phone]

## 2013-04-11 NOTE — Progress Notes (Signed)
Inpatient Diabetes Program Recommendations  AACE/ADA: New Consensus Statement on Inpatient Glycemic Control (2013)  Target Ranges:  Prepandial:   less than 140 mg/dL      Peak postprandial:   less than 180 mg/dL (1-2 hours)      Critically ill patients:  140 - 180 mg/dL     Results for Celesta GentileMONROE, Kristinia L (MRN [id number]) as of 04/11/2013 11:58  Ref. Range 04/09/2013 08:10 04/09/2013 11:43 04/09/2013 16:52 04/09/2013 22:01  Glucose-Capillary Latest Range: 70-99 mg/dL 578214 (H) 469257 (H) 629208 (H) 225 (H)    Results for Celesta GentileMONROE, Chareese L (MRN [id number]) as of 04/11/2013 11:58  Ref. Range 04/10/2013 08:42 04/10/2013 11:24 04/10/2013 17:09 04/10/2013 22:02  Glucose-Capillary Latest Range: 70-99 mg/dL 010256 (H) 272214 (H) 536181 (H) 317 (H)    Results for Celesta GentileMONROE, Feven L (MRN [id number]) as of 04/11/2013 11:58  Ref. Range 04/11/2013 08:09  Glucose-Capillary Latest Range: 70-99 mg/dL 84     **Note pt received a total of 110 units Lantus yesterday (03/17).  Pt received 50 units Lantus yesterday at 10am and then another 60 units Lantus last night at bedtime.  Original Lantus order was for 50 units daily in the AM.  Order got increased to 60 units QHS.    **Would not recommend making any insulin adjustments today since pt received such a large dose of Lantus yesterday.  Not scheduled to get any Lantus until tonight at bedtime.   Will follow. Ambrose FinlandJeannine Johnston Zavannah Deblois RN, MSN, CDE Diabetes Coordinator Inpatient Diabetes Program Team Pager: [phone] (8a-10p)

## 2013-04-11 NOTE — Progress Notes (Signed)
Patient ID: Jasmine [name], female   DOB: [birth year], 43 y.o.   MRN: [id number]   [facility] Progress Note    Assessment/ Plan:   1 Acute on CKD 3- fair urine output overnight on lasix. Renal function continues to improve - will discontinue her dialysis catheter. Increase to her OP dose of lasix 80mg  BID.  2 CKD stage 3 (baseline creat 1.8-2.1) : From underlying diabetes/hypertension, continue to monitor  3 DM w multiple complications  4 L buttock abcess s/p I&D : Seen by surgery and appears to be suitable for outpatient wound care.  5 Anemia on darbe 100/wk , no overt losses  6 HTN : BP trending upwards, increase lasix and will re-assess anti-HTN rx (previously on hydralazine 25 mg 3 times a day, amlodipine 10 mg daily and bisoprolol/hydrochlorothiazide 5/6.25 mg daily along with metolazone 2.5 mg daily and furosemide 80 mg twice a day)  Subjective:   Reports to be feeling better- some pain over buttock from wound care earlier. Inquires about going home.    Objective:   BP 143/65  Pulse 92  Temp(Src) 98.6 F (37 C) (Oral)  Resp 18  Ht 5\' 7"  (1.702 m)  Wt 118.253 kg (260 lb 11.2 oz)  BMI 40.82 kg/m2  SpO2 100%  LMP 02/26/2013  Intake/Output Summary (Last 24 hours) at 04/11/13 1118 Last data filed at 04/11/13 0900  Gross per 24 hour  Intake   1080 ml  Output    750 ml  Net    330 ml   Weight change: 0.953 kg (2 lb 1.6 oz)  Physical Exam: Gen: Sitting comfortably in recliner CVS: Pulse regular in rate and rhythm, S1 and S2 with an ejection systolic murmur Resp: Clear to auscultation bilaterally-no rales/rhonchi Abd: Soft, obese, nontender and bowel sounds are normal Ext: 2-3+ lower extremity edema bilaterally  Imaging: No results found.  Labs: BMET  Recent Labs Lab 04/06/13 0900 04/07/13 1606 04/08/13 0547 04/09/13 0450 04/10/13 1027 04/11/13 0538  NA 138 137 135* 134* 136* 141  K 4.1 4.3 4.8 5.2 4.7 4.7  CL 94* 95* 93* 97 96 100  CO2 23 28  25  17* 24 25  GLUCOSE 267* 219* 335* 222* 284* 85  BUN 23 38* 43* 48* 57* 60*  CREATININE 4.49* 6.08* 5.82* 4.84* 4.35* 4.00*  CALCIUM 9.4 9.3 8.9 9.1 9.2 9.2  PHOS 2.8  --   --   --   --   --    CBC  Recent Labs Lab 04/07/13 0559 04/11/13 0538  WBC 10.9* 9.1  HGB 10.0* 10.4*  HCT 32.1* 33.4*  MCV 91.7 91.5  PLT 417* 446*   Medications:    . aspirin  81 mg Oral Daily  . atorvastatin  40 mg Oral q1800  . calcium acetate  667 mg Oral TID WC  . clonazePAM  0.5 mg Oral BID  . collagenase   Topical Daily  . darbepoetin (ARANESP) injection - DIALYSIS  100 mcg Intravenous Q Tue-HD  . enoxaparin (LOVENOX) injection  30 mg Subcutaneous Q24H  . furosemide  80 mg Oral Daily  . insulin aspart  0-20 Units Subcutaneous TID WC & HS  . insulin aspart  4 Units Subcutaneous TID WC  . insulin glargine  60 Units Subcutaneous QHS  . metoCLOPramide  5 mg Oral TID AC  . multivitamin  1 tablet Oral QHS  . saccharomyces boulardii  250 mg Oral BID  . traZODone  50 mg Oral QHS   Vonna Kotyk  Allena KatzPatel, MD 04/11/2013, 11:18 AM

## 2013-04-11 NOTE — Progress Notes (Signed)
[facility] PROGRESS NOTE  Jasmine [name] WUJ:[id number] DOB: July 26, 1970 DOA: 03/23/2013 PCP: Dois Davenport., MD  Assessment/Plan:  DKA (diabetic ketoacidoses)/DIABETES MELLITUS, TYPE I  -resolved  -CBG's improving  -continue lantus; continue also SSI and meal coverage  -patient low carb diet   Acute-on-chronic kidney injury/ CKD (chronic kidney disease), stage IV  -per Nephrology  -patient continue to make some urine  -Cr coming down without HD (4.00 on 3/18)   Sepsis  -due to PNA and buttock abscess  -sepsis physiology has resolved  -antibiotic therapy completed  -continue wound care as indicated by surgery service  -no fever and WBC's trending down/pretty much back to normal range  -will monitor   Acute respiratory failure with hypoxia:  A) Volume Overload  B) Strep PNA  C) Heart failure, diastolic, chronic  D) OSA (obstructive sleep apnea)  -PNA initially, then subsequent volume overload from volume resuscitation in setting of ARF  -patient approx 13-14 L off since HD started; and now building up some extra fluid. Will restart lasix as recommended by renal service  -continue CPAP QHS  -patient w/o SOB at this point   Diarrhea  -has leukocytosis and low grade fevers  -C. Diff neg  -continue rocephin  -associated to side effects of abx's; will continue probiotic   Hypertension  -will be permissive with HTN during kidney recovery  -BP and volume was controlled with HD  -will restart lasix   Gastroparesis  -significant nausea explained by gastroparesis and uremia  -also could be the cause of diarrhea  -continue reglan to PO TID (well tolerated)  -continue PRN zofran   Left buttock abscess/ Abscess, perirectal s/p I&D 03/23/2013; planning another I&D at bedside on 3/16  -cont current wound care and follow rec's from surgery service  -Wound culture positive for Proteus  -antibiotics completed  -no fever  -CBC showing WBC's trending down   DEPRESSION and  anxiety  -Psych has seen patient and recommended klonopin 0.5 mg BID and trazodone 50mg  QHS  -no SI and mood stable   Anemia, chronic disease  -cont Aranesp  -has received 1 unit PRBCs during this admission  -Hgb stable   DVT prophylaxis: Lovenox  Code Status: Full code  Family Communication: No family at bedside  Disposition Plan/Expected LOS: remained inpatient  Consultants:  Nephrology  Surgery   Antibiotics:  None  HPI/Subjective: Pt has no new complaints. No acute issues reported overnight.  Objective: Filed Vitals:   04/11/13 1322  BP: 132/71  Pulse: 98  Temp: 98.4 F (36.9 C)  Resp: 18    Intake/Output Summary (Last 24 hours) at 04/11/13 1520 Last data filed at 04/11/13 1300  Gross per 24 hour  Intake   1080 ml  Output    800 ml  Net    280 ml   Filed Weights   04/08/13 2100 04/09/13 2203 04/10/13 2202  Weight: 115.713 kg (255 lb 1.6 oz) 117.3 kg (258 lb 9.6 oz) 118.253 kg (260 lb 11.2 oz)    Exam:   General:  Pt in NAD, Alert and awake  Cardiovascular: RRR, no MRG  Respiratory: CTA BL, no wheezes  Abdomen: soft, NT, ND  Musculoskeletal: no cyanosis or clubbing   Data Reviewed: Basic Metabolic Panel:  Recent Labs Lab 04/06/13 0900 04/07/13 1606 04/08/13 0547 04/09/13 0450 04/10/13 1027 04/11/13 0538  NA 138 137 135* 134* 136* 141  K 4.1 4.3 4.8 5.2 4.7 4.7  CL 94* 95* 93* 97 96 100  CO2 23 28 25  17* 24 25  GLUCOSE 267* 219* 335* 222* 284* 85  BUN 23 38* 43* 48* 57* 60*  CREATININE 4.49* 6.08* 5.82* 4.84* 4.35* 4.00*  CALCIUM 9.4 9.3 8.9 9.1 9.2 9.2  PHOS 2.8  --   --   --   --   --    Liver Function Tests:  Recent Labs Lab 04/06/13 0900  ALBUMIN 2.9*   No results found for this basename: LIPASE, AMYLASE,  in the last 168 hours No results found for this basename: AMMONIA,  in the last 168 hours CBC:  Recent Labs Lab 04/07/13 0559 04/11/13 0538  WBC 10.9* 9.1  HGB 10.0* 10.4*  HCT 32.1* 33.4*  MCV 91.7 91.5   PLT 417* 446*   Cardiac Enzymes: No results found for this basename: CKTOTAL, CKMB, CKMBINDEX, TROPONINI,  in the last 168 hours BNP (last 3 results)  Recent Labs  10/07/12 2126 11/01/12 1446 03/24/13 1200  PROBNP 1631.0* 2441.0* 6479.0*   CBG:  Recent Labs Lab 04/10/13 1124 04/10/13 1709 04/10/13 2202 04/11/13 0809 04/11/13 1201  GLUCAP 214* 181* 317* 84 138*    Recent Results (from the past 240 hour(s))  CLOSTRIDIUM DIFFICILE BY PCR     Status: None   Collection Time    04/04/13  6:03 PM      Result Value Ref Range Status   C difficile by pcr NEGATIVE  NEGATIVE Final  URINE CULTURE     Status: None   Collection Time    04/06/13 10:18 PM      Result Value Ref Range Status   Specimen Description URINE, CLEAN CATCH   Final   Special Requests NONE   Final   Culture  Setup Time     Final   Value: 04/07/2013 04:16     [facility]   Colony Count     Final   Value: NO GROWTH     [facility] at [facility]   Culture     Final   Value: NO GROWTH     Performed at [facility]   Report Status 04/08/2013 FINAL   Final     Studies: No results found.  Scheduled Meds: . aspirin  81 mg Oral Daily  . atorvastatin  40 mg Oral q1800  . calcium acetate  667 mg Oral TID WC  . clonazePAM  0.5 mg Oral BID  . collagenase   Topical Daily  . darbepoetin (ARANESP) injection - DIALYSIS  100 mcg Intravenous Q Tue-HD  . enoxaparin (LOVENOX) injection  30 mg Subcutaneous Q24H  . furosemide  80 mg Oral Daily  . insulin aspart  0-20 Units Subcutaneous TID WC & HS  . insulin aspart  4 Units Subcutaneous TID WC  . insulin glargine  60 Units Subcutaneous QHS  . metoCLOPramide  5 mg Oral TID AC  . multivitamin  1 tablet Oral QHS  . saccharomyces boulardii  250 mg Oral BID  . traZODone  50 mg Oral QHS   Continuous Infusions:   Principal Problem:   DKA (diabetic ketoacidoses) Active Problems:   DIABETES MELLITUS, TYPE I   DEPRESSION   Heart  failure, diastolic, chronic   Hypertension   OSA (obstructive sleep apnea)   Anemia, chronic disease   CKD (chronic kidney disease), stage IV   Gastroparesis   Left buttock abscess   Acute-on-chronic kidney injury   Sepsis   Abscess, perirectal s/p I&D 03/23/2013   Acute respiratory failure with hypoxia  Time spent: > 35 minutes    Penny Pia  [facility] Pager [phone] If 7PM-7AM, please contact night-coverage at [email or web address], password [facility] 04/11/2013, 3:20 PM  LOS: 19 days

## 2013-04-11 NOTE — Progress Notes (Signed)
Stable and can follow up as outpatient in [facility] clinic.

## 2013-04-11 NOTE — Progress Notes (Signed)
Subjective: No new complaints.  Minimal pain to area of debridement, but better than yesterday. Was sleeping on our entrance to room.  Was chaperoned by pts nurse, [place] Citizen.  Objective: Vital signs in last 24 hours: Temp:  [98.2 F (36.8 C)-99.1 F (37.3 C)] 98.4 F (36.9 C) (03/18 0530) Pulse Rate:  [88-95] 94 (03/18 0530) Resp:  [17-19] 19 (03/18 0530) BP: (120-158)/(60-76) 158/60 mmHg (03/18 0530) SpO2:  [98 %-100 %] 100 % (03/18 0530) Weight:  [260 lb 11.2 oz (118.253 kg)] 260 lb 11.2 oz (118.253 kg) (03/17 2202) Last BM Date: 04/09/13  Intake/Output from previous day: 03/17 0701 - 03/18 0700 In: 960 [P.O.:960] Out: 500 [Urine:500] Intake/Output this shift:    PE: Gen:  Alert, NAD, pleasant Buttock: Stage III pressure ulcer measuring approx 3.5 cm in diameter x 2.5cm deep. Healthy tissue around edges, still no obvious granulation tissue. Some yellow-tan slough visible in wound, approx 25-30% of wound bed. Even less tender than yesterday.   Lab Results:   Recent Labs  04/11/13 0538  WBC 9.1  HGB 10.4*  HCT 33.4*  PLT 446*   BMET  Recent Labs  04/10/13 1027 04/11/13 0538  NA 136* 141  K 4.7 4.7  CL 96 100  CO2 24 25  GLUCOSE 284* 85  BUN 57* 60*  CREATININE 4.35* 4.00*  CALCIUM 9.2 9.2   PT/INR No results found for this basename: LABPROT, INR,  in the last 72 hours CMP     Component Value Date/Time   NA 141 04/11/2013 0538   K 4.7 04/11/2013 0538   CL 100 04/11/2013 0538   CO2 25 04/11/2013 0538   GLUCOSE 85 04/11/2013 0538   BUN 60* 04/11/2013 0538   CREATININE 4.00* 04/11/2013 0538   CALCIUM 9.2 04/11/2013 0538   PROT 6.9 10/11/2012 0400   ALBUMIN 2.9* 04/06/2013 0900   AST 12 10/11/2012 0400   ALT 11 10/11/2012 0400   ALKPHOS 117 10/11/2012 0400   BILITOT 0.2* 10/11/2012 0400   GFRNONAA 13* 04/11/2013 0538   GFRAA 15* 04/11/2013 0538   Lipase     Component Value Date/Time   LIPASE 23 08/05/2009 1323       Studies/Results: No results  found.  Anti-infectives: Anti-infectives   Start     Dose/Rate Route Frequency Ordered Stop   04/06/13 1600  cefTRIAXone (ROCEPHIN) 2 g in dextrose 5 % 50 mL IVPB     2 g 100 mL/hr over 30 Minutes Intravenous Every 24 hours 04/05/13 1730 04/07/13 1832   04/03/13 1500  cefTRIAXone (ROCEPHIN) 2 g in dextrose 5 % 50 mL IVPB     2 g 100 mL/hr over 30 Minutes Intravenous Every 24 hours 04/03/13 1023 04/05/13 1641   03/29/13 1500  cefTRIAXone (ROCEPHIN) 2 g in dextrose 5 % 50 mL IVPB  Status:  Discontinued     2 g 100 mL/hr over 30 Minutes Intravenous Every 24 hours 03/29/13 1439 04/03/13 1023   03/28/13 2100  vancomycin (VANCOCIN) 1,250 mg in sodium chloride 0.9 % 250 mL IVPB  Status:  Discontinued     1,250 mg 166.7 mL/hr over 90 Minutes Intravenous Every 24 hours 03/28/13 1120 03/28/13 1125   03/28/13 2000  Levofloxacin (LEVAQUIN) IVPB 250 mg  Status:  Discontinued     250 mg 50 mL/hr over 60 Minutes Intravenous Every 24 hours 03/28/13 1118 03/28/13 1125   03/27/13 2000  vancomycin (VANCOCIN) 1,750 mg in sodium chloride 0.9 % 500 mL IVPB  Status:  Discontinued  1,750 mg 250 mL/hr over 120 Minutes Intravenous Every 48 hours 03/26/13 0931 03/28/13 1120   03/25/13 1800  levofloxacin (LEVAQUIN) IVPB 750 mg  Status:  Discontinued     750 mg 100 mL/hr over 90 Minutes Intravenous Every 48 hours 03/25/13 1625 03/28/13 1116   03/24/13 2000  vancomycin (VANCOCIN) 1,750 mg in sodium chloride 0.9 % 500 mL IVPB  Status:  Discontinued     1,750 mg 250 mL/hr over 120 Minutes Intravenous Every 24 hours 03/23/13 1903 03/26/13 0931   03/23/13 2000  piperacillin-tazobactam (ZOSYN) IVPB 3.375 g  Status:  Discontinued     3.375 g 12.5 mL/hr over 240 Minutes Intravenous 3 times per day 03/23/13 1903 03/28/13 1123   03/23/13 2000  vancomycin (VANCOCIN) 2,000 mg in sodium chloride 0.9 % 500 mL IVPB     2,000 mg 250 mL/hr over 120 Minutes Intravenous  Once 03/23/13 1903 03/24/13 0005   03/23/13 1345   clindamycin (CLEOCIN) IVPB 600 mg     600 mg 100 mL/hr over 30 Minutes Intravenous  Once 03/23/13 1338 03/23/13 1514       Assessment/Plan Buttock abscess, s/p I&D by EDP  Stage III Pressure wound, s/p I&D  Comorbidities including DM Type I, CHF, HTN and more  1. Continue Santyl and hydrotherapy  2. Wet to dry dressing changes BID  3. Lovenox for VTE prohpylaxis.  4. Ambulate as able. May benefit from doughnut pad which she can get from a medical supply.  5. Other comorbidities managed by medicine.  6. Not currently on any ABX.  Dispo: [facility]. Will need training for self/family for BID Wet-Dry dressing changes to be continued until fully healed. Follow up in our clinic in 3 weeks. (On 04/10/13).  Will follow a few times a week if still here after today.    LOS: 19 days    Michael A. Dion Saucierillery, PA-S [employer] 04/11/2013, 7:25 AM [facility] Phone #: [phone]   ----------------------------------------------------------------------------------------------------------- General Surgery PA Preceptor Note:  I agree with the above PA students findings as above.    Aris GeorgiaMegan Dort, PA-C [facility] Pager: [phone] Office: [phone]

## 2013-04-12 LAB — BASIC METABOLIC PANEL
BUN: 60 mg/dL — AB (ref 6–23)
CALCIUM: 8.9 mg/dL (ref 8.4–10.5)
CO2: 22 mEq/L (ref 19–32)
CREATININE: 3.36 mg/dL — AB (ref 0.50–1.10)
Chloride: 102 mEq/L (ref 96–112)
GFR, EST AFRICAN AMERICAN: 18 mL/min — AB (ref >90)
GFR, EST NON AFRICAN AMERICAN: 16 mL/min — AB (ref >90)
Glucose, Bld: 70 mg/dL (ref 70–99)
POTASSIUM: 4.3 meq/L (ref 3.7–5.3)
Sodium: 138 mEq/L (ref 137–147)

## 2013-04-12 LAB — GLUCOSE, CAPILLARY
GLUCOSE-CAPILLARY: 125 mg/dL — AB (ref 70–99)
Glucose-Capillary: 78 mg/dL (ref 70–99)
Glucose-Capillary: 114 mg/dL — ABNORMAL HIGH (ref 70–99)
Glucose-Capillary: 101 mg/dL — ABNORMAL HIGH (ref 70–99)
Glucose-Capillary: 160 mg/dL — ABNORMAL HIGH (ref 70–99)

## 2013-04-12 MED ORDER — BISOPROLOL FUMARATE 5 MG PO TABS
5.0000 mg | ORAL_TABLET | Freq: Every day | ORAL | Status: DC
  Administered 2013-04-12: 5 mg via ORAL
  Filled 2013-04-12 (×2): qty 1

## 2013-04-12 NOTE — Progress Notes (Signed)
Patient ID: Jasmine [name], female   DOB: Feb 04, 1970, 43 y.o.   MRN: [id number]   [facility] Progress Note    Assessment/ Plan:   1 Acute on CKD : continues to have a fair urine output overnight on lasix. Renal function continues to improve. Back on lasix 80mg  PO BID (holding thiazides for now). May be able to DC home tomorrow if creatinine continues to improve and she remains stable. She has a f/u appt scheduled with Dr.Coladonato (renal) next week.   2 CKD stage 3 (baseline creat 1.8-2.1) : From underlying diabetes/hypertension, continue to monitor  3 DM w multiple complications  4 L buttock abcess s/p I&D : Seen by surgery and appears to be suitable for outpatient wound care.  5 Anemia on darbe 100/wk , no overt losses  6 HTN : BP fair on lasix- restart bisoprolol   Subjective:   Reports to be doing better and ambulated hallways with PT   Objective:   BP 140/80  Pulse 92  Temp(Src) 98.5 F (36.9 C) (Oral)  Resp 18  Ht 5\' 7"  (1.702 m)  Wt 117.663 kg (259 lb 6.4 oz)  BMI 40.62 kg/m2  SpO2 100%  LMP 02/26/2013  Intake/Output Summary (Last 24 hours) at 04/12/13 1000 Last data filed at 04/12/13 0500  Gross per 24 hour  Intake    480 ml  Output   1025 ml  Net   -545 ml   Weight change: -0.59 kg (-1 lb 4.8 oz)  Physical Exam: Gen: Comfortably sitting up in her recliner  CVS: Pulse regular in rate and rhythm, heart sounds S1 and S2 normal Resp: Clear to auscultation bilaterally-no rales/rhonchi Abd: Soft, obese, nontender Ext: 2+ lower extremity edema  Imaging: No results found.  Labs: BMET  Recent Labs Lab 04/06/13 0900 04/07/13 1606 04/08/13 0547 04/09/13 0450 04/10/13 1027 04/11/13 0538 04/12/13 0545  NA 138 137 135* 134* 136* 141 138  K 4.1 4.3 4.8 5.2 4.7 4.7 4.3  CL 94* 95* 93* 97 96 100 102  CO2 23 28 25  17* 24 25 22   GLUCOSE 267* 219* 335* 222* 284* 85 70  BUN 23 38* 43* 48* 57* 60* 60*  CREATININE 4.49* 6.08* 5.82* 4.84* 4.35* 4.00*  3.36*  CALCIUM 9.4 9.3 8.9 9.1 9.2 9.2 8.9  PHOS 2.8  --   --   --   --   --   --    CBC  Recent Labs Lab 04/07/13 0559 04/11/13 0538  WBC 10.9* 9.1  HGB 10.0* 10.4*  HCT 32.1* 33.4*  MCV 91.7 91.5  PLT 417* 446*   Medications:    . aspirin  81 mg Oral Daily  . atorvastatin  40 mg Oral q1800  . calcium acetate  667 mg Oral TID WC  . clonazePAM  0.5 mg Oral BID  . collagenase   Topical Daily  . darbepoetin (ARANESP) injection - DIALYSIS  100 mcg Intravenous Q Tue-HD  . enoxaparin (LOVENOX) injection  30 mg Subcutaneous Q24H  . furosemide  80 mg Oral Daily  . insulin aspart  0-20 Units Subcutaneous TID WC & HS  . insulin aspart  4 Units Subcutaneous TID WC  . insulin glargine  60 Units Subcutaneous QHS  . metoCLOPramide  5 mg Oral TID AC  . multivitamin  1 tablet Oral QHS  . saccharomyces boulardii  250 mg Oral BID  . traZODone  50 mg Oral QHS   Zetta BillsJay Brienna Bass, MD 04/12/2013, 10:00 AM

## 2013-04-12 NOTE — Progress Notes (Signed)
Physical Therapy Wound Treatment Patient Details  Name: Jasmine [name] MRN: [id number] Date of Birth: 12/19/1970  Today's Date: 04/12/2013 Time: 7371-0626 Time Calculation (min): 27 min  Subjective  Subjective: Everyone tells me it looks good, but I think it looks nasty.    Pain Score:  premedicated.    Wound Assessment  Wound / Incision (Open or Dehisced) 03/23/13 Other (Comment) Buttocks Left abscess, I&D performed today (Active)  Dressing Type Moist to dry;Gauze (Comment);Barrier Film (skin prep);Other (Comment) 04/12/2013  2:00 PM  Dressing Changed Changed 04/12/2013  2:00 PM  Dressing Status Clean;Dry;Intact 04/12/2013  2:00 PM  Dressing Change Frequency Twice a day 04/12/2013  2:00 PM  Site / Wound Assessment Yellow;Red;Pink 04/12/2013  2:00 PM  % Wound base Red or Granulating 85% 04/12/2013  2:00 PM  % Wound base Yellow 15% 04/12/2013  2:00 PM  % Wound base Black 0% 04/12/2013  2:00 PM  % Wound base Other (Comment) 0% 04/12/2013  2:00 PM  Peri-wound Assessment Intact 04/12/2013  2:00 PM  Wound Length (cm) 5.5 cm 04/06/2013  3:18 PM  Wound Width (cm) 4.4 cm 04/06/2013  3:18 PM  Wound Depth (cm) 2.2 cm 04/06/2013  3:18 PM  Undermining (cm) 3 cm 5 o'clock to 7 o'clock 04/11/2013 11:32 AM  Margins Unattached edges (unapproximated) 04/12/2013  2:00 PM  Closure None 04/12/2013  2:00 PM  Drainage Amount Minimal 04/12/2013  2:00 PM  Drainage Description Serous 04/12/2013  2:00 PM  Non-staged Wound Description Not applicable 9/48/5462  7:03 PM  Treatment Debridement (Selective);Hydrotherapy (Pulse lavage);Packing (Saline gauze) 04/12/2013  2:00 PM   Hydrotherapy Pulsed lavage therapy - wound location: Lt buttock Pulsed Lavage with Suction (psi): 8 psi Pulsed Lavage with Suction - Normal Saline Used: 1000 mL Pulsed Lavage Tip: Tip with splash shield Selective Debridement Selective Debridement - Location: Lt buttock Selective Debridement - Tools Used: Forceps;Scissors Selective Debridement -  Tissue Removed: yellow slough   Wound Assessment and Plan  Wound Therapy - Assess/Plan/Recommendations Wound Therapy - Clinical Statement: Wound continues to improve. Hydrotherapy Plan: Debridement;Dressing change;Patient/family education;Pulsatile lavage with suction Wound Therapy - Frequency: 6X / week Wound Therapy - Follow Up Recommendations: Home health RN Wound Plan: see above  Wound Therapy Goals- Improve the function of patient's integumentary system by progressing the wound(s) through the phases of wound healing (inflammation - proliferation - remodeling) by: Decrease Necrotic Tissue to: 10 Decrease Necrotic Tissue - Progress: Progressing toward goal Increase Granulation Tissue to: 90 Increase Granulation Tissue - Progress: Progressing toward goal Patient/Family will be able to : verbalize appropriate positioning and pressure relief to promote wound healing. Patient/Family Instruction Goal - Progress: Progressing toward goal  Goals will be updated until maximal potential achieved or discharge criteria met.  Discharge criteria: when goals achieved, discharge from hospital, MD decision/surgical intervention, no progress towards goals, refusal/missing three consecutive treatments without notification or medical reason.  GP     Sofya Moustafa, Thornton Papas, [place] 04/12/2013, 2:37 PM

## 2013-04-12 NOTE — Progress Notes (Signed)
Physical Therapy Treatment Patient Details Name: Salimata L Bishara MRN: [id number] DOB: 1970/12/12 Today's Date: 04/12/2013 Time: 1751-0258 PT Time Calculation (min): 24 min  PT Assessment / Plan / Recommendation  History of Present Illness Anjelique L Tullo is a 43 y.o. female with a past medical history of poorly controlled insulin-dependent diabetes mellitus, nonobstructive coronary artery disease per cardiac catheterization in 2010, hypertension, stage III chronic kidney disease, presented with elevated blood sugars and buttock abcess with elevated white count and abnormal bicarbonate levels.  Was intubated on 3/3 and transferred to [facility] on 3/4 for CVVHD, extubated 3/8.   PT Comments   Patient progressing met all goals except still with supervision and cues for proper cane use.  Feel she can manage at home safely for few hours without current need for 24 hour assist.  Educated on safety with cane.  Will continue to work towards goals till d/c.  Follow Up Recommendations  Home health PT;Supervision - Intermittent           Equipment Recommendations  Cane    Recommendations for Other Services  None  Frequency Min 3X/week   Progress towards PT Goals Progress towards PT goals: Progressing toward goals  Plan Current plan remains appropriate    Precautions / Restrictions Precautions Precautions: Fall Restrictions Weight Bearing Restrictions: No   Pertinent Vitals/Pain Min c/o knee stiffness educated in use of AROM and heat for stiffness/aching pain    Mobility  Bed Mobility General bed mobility comments: NT patient up in chair Transfers Overall transfer level: Modified independent Sit to Stand: Modified independent (Device/Increase time) Ambulation/Gait Ambulation/Gait assistance: Supervision Ambulation Distance (Feet): 300 Feet Assistive device: Straight cane Gait Pattern/deviations: Step-through pattern;Wide base of support General Gait Details: cues for sequence with  cane Stairs: Yes Stairs assistance: Supervision Stair Management: One rail Right;Step to pattern;With cane;Forwards Number of Stairs: 10 General stair comments: demonstrated use of cane on stairs, supervision for safety      PT Goals (current goals can now be found in the care plan section)    Visit Information  Last PT Received On: 04/12/13 Assistance Needed: +1 History of Present Illness: Deniesha L Meroney is a 43 y.o. female with a past medical history of poorly controlled insulin-dependent diabetes mellitus, nonobstructive coronary artery disease per cardiac catheterization in 2010, hypertension, stage III chronic kidney disease, presented with elevated blood sugars and buttock abcess with elevated white count and abnormal bicarbonate levels.  Was intubated on 3/3 and transferred to [facility] on 3/4 for CVVHD, extubated 3/8.    Subjective Data   May go home today   Cognition  Cognition Arousal/Alertness: Awake/alert Behavior During Therapy: WFL for tasks assessed/performed Overall Cognitive Status: Within Functional Limits for tasks assessed    Balance  Balance Standing balance-Leahy Scale: Good Standing balance comment: can take challenges and function in small area independent, but uses cane for distance and feels more steady with UE assist  End of Session PT - End of Session Equipment Utilized During Treatment: Gait belt Activity Tolerance: Patient limited by fatigue Patient left: in chair;with call bell/phone within reach   GP     [facility] 04/12/2013, [place], [place] 04/12/2013

## 2013-04-12 NOTE — Progress Notes (Signed)
[facility] PROGRESS NOTE  Jasmine [name] ZOX:[id number] DOB: [birth year] DOA: 03/23/2013 PCP: Dois Davenport., MD  Assessment/Plan:  DKA (diabetic ketoacidoses)/DIABETES MELLITUS, TYPE I  - Resolved  - CBG's improving  - Continue lantus; continue also SSI and meal coverage  - Patient low carb diet   Acute-on-chronic kidney injury/ CKD (chronic kidney disease), stage IV  - per Nephrology  - patient continues to make urine  - Cr coming down without HD (3.36 on 3/19)   Sepsis  - Sepsis physiology has resolved  - Due to PNA and buttock abscess  - Antibiotic therapy completed  - Continue wound care as indicated by surgery service - No fever or leukocytosis  - Continue to monitor   Acute respiratory failure with hypoxia:  A) Volume Overload  B) Strep PNA  C) Heart failure, diastolic, chronic  D) OSA (obstructive sleep apnea)  - PNA initially, then subsequent volume overload from volume resuscitation in setting of ARF  - Patient approx 13-14 L off with HD; HD complete  - Restarted lasix as recommended by renal service  - Continue CPAP QHS  - Patient w/o SOB at this point   Diarrhea  - Had leukocytosis and low grade fevers  - C. Diff neg  - Completed Rocephin abx  - Associated to side effects of abx's; will continue probiotic   Hypertension  - Will be permissive with HTN during kidney recovery  - BP and volume was controlled with HD  - Restarted lasix 3/17 - Start bisoprolol on 3/19  Gastroparesis  - Significant nausea explained by gastroparesis and uremia  - Could also be the cause of diarrhea  - Continue reglan to PO TID (well tolerated)  - Continue PRN zofran   Left buttock abscess/ Abscess, perirectal s/p I&D 03/23/2013; planning another I&D at bedside on 3/16  - Cont current wound care and follow rec's from surgery service: wet to dry dressing changes BID, santyl and hydrotherapy. - HH wound care, family needs training prior to discharge (husband starts work  daily at [employer]) and doughnut pad. - Follow up in surgery clinic three weeks from 04/10/13. - Wound culture positive for Proteus  - Antibiotics completed 3/14 - no fever or leukocytosis   DEPRESSION and anxiety  - Psych has seen patient and recommended klonopin 0.5 mg BID and trazodone 50mg  QHS  - No SI and mood stable   Anemia, chronic disease  - cont Aranesp  - has received 1 unit PRBCs during this admission  - Hgb stable   DVT prophylaxis: Lovenox  Code Status: Full code  Family Communication: No family at bedside  Disposition Plan/Expected LOS: Pending further improvement in kidney function   Consultants:  Nephrology  Surgery  PT: recommendations- Cane   Antibiotics:  Rocephin: 10 day course completed 3/14  HPI/Subjective: Pt has no new complaints. No acute issues reported overnight.  Objective: Filed Vitals:   04/12/13 1000  BP: 147/76  Pulse: 100  Temp: 98.2 F (36.8 C)  Resp: 18    Intake/Output Summary (Last 24 hours) at 04/12/13 1215 Last data filed at 04/12/13 0900  Gross per 24 hour  Intake    720 ml  Output   1225 ml  Net   -505 ml   Filed Weights   04/09/13 2203 04/10/13 2202 04/11/13 2207  Weight: 117.3 kg (258 lb 9.6 oz) 118.253 kg (260 lb 11.2 oz) 117.663 kg (259 lb 6.4 oz)    Exam:   General:  Pt in NAD, Alert and  awake  Cardiovascular: RRR, no MRG  Respiratory: CTA BL, no wheezes  Abdomen: soft, NT, ND, + bowel sounds.  Musculoskeletal: no cyanosis or clubbing. drsg to buttock wound.  Data Reviewed: Basic Metabolic Panel:  Recent Labs Lab 04/06/13 0900  04/08/13 0547 04/09/13 0450 04/10/13 1027 04/11/13 0538 04/12/13 0545  NA 138  < > 135* 134* 136* 141 138  K 4.1  < > 4.8 5.2 4.7 4.7 4.3  CL 94*  < > 93* 97 96 100 102  CO2 23  < > 25 17* 24 25 22   GLUCOSE 267*  < > 335* 222* 284* 85 70  BUN 23  < > 43* 48* 57* 60* 60*  CREATININE 4.49*  < > 5.82* 4.84* 4.35* 4.00* 3.36*  CALCIUM 9.4  < > 8.9 9.1 9.2 9.2 8.9  PHOS  2.8  --   --   --   --   --   --   < > = values in this interval not displayed. Liver Function Tests:  Recent Labs Lab 04/06/13 0900  ALBUMIN 2.9*   No results found for this basename: LIPASE, AMYLASE,  in the last 168 hours No results found for this basename: AMMONIA,  in the last 168 hours CBC:  Recent Labs Lab 04/07/13 0559 04/11/13 0538  WBC 10.9* 9.1  HGB 10.0* 10.4*  HCT 32.1* 33.4*  MCV 91.7 91.5  PLT 417* 446*   Cardiac Enzymes: No results found for this basename: CKTOTAL, CKMB, CKMBINDEX, TROPONINI,  in the last 168 hours BNP (last 3 results)  Recent Labs  10/07/12 2126 11/01/12 1446 03/24/13 1200  PROBNP 1631.0* 2441.0* 6479.0*   CBG:  Recent Labs Lab 04/11/13 1201 04/11/13 1703 04/11/13 2132 04/12/13 0818 04/12/13 1142  GLUCAP 138* 110* 136* 78 114*    Recent Results (from the past 240 hour(s))  CLOSTRIDIUM DIFFICILE BY PCR     Status: None   Collection Time    04/04/13  6:03 PM      Result Value Ref Range Status   C difficile by pcr NEGATIVE  NEGATIVE Final  URINE CULTURE     Status: None   Collection Time    04/06/13 10:18 PM      Result Value Ref Range Status   Specimen Description URINE, CLEAN CATCH   Final   Special Requests NONE   Final   Culture  Setup Time     Final   Value: 04/07/2013 04:16     [facility]formed at [facility]   Colony Count     Final   Value: NO GROWTH     [facility]formed at [facility]   Culture     Final   Value: NO GROWTH     Performed at [facility]   Report Status 04/08/2013 FINAL   Final     Studies: No results found.  Scheduled Meds: . aspirin  81 mg Oral Daily  . atorvastatin  40 mg Oral q1800  . bisoprolol  5 mg Oral Daily  . calcium acetate  667 mg Oral TID WC  . clonazePAM  0.5 mg Oral BID  . collagenase   Topical Daily  . darbepoetin (ARANESP) injection - DIALYSIS  100 mcg Intravenous Q Tue-HD  . enoxaparin (LOVENOX) injection  30 mg Subcutaneous Q24H  . furosemide  80 mg  Oral Daily  . insulin aspart  0-20 Units Subcutaneous TID WC & HS  . insulin aspart  4 Units Subcutaneous TID WC  . insulin  glargine  60 Units Subcutaneous QHS  . metoCLOPramide  5 mg Oral TID AC  . multivitamin  1 tablet Oral QHS  . saccharomyces boulardii  250 mg Oral BID  . traZODone  50 mg Oral QHS   Continuous Infusions:   Principal Problem:   DKA (diabetic ketoacidoses) Active Problems:   DIABETES MELLITUS, TYPE I   DEPRESSION   Heart failure, diastolic, chronic   Hypertension   OSA (obstructive sleep apnea)   Anemia, chronic disease   CKD (chronic kidney disease), stage IV   Gastroparesis   Left buttock abscess   Acute-on-chronic kidney injury   Sepsis   Abscess, perirectal s/p I&D 03/23/2013   Acute respiratory failure with hypoxia    Time spent: > 40 minutes    Parke Simmers  [facility] Pager [phone] If 7PM-7AM, please contact night-coverage at [email or web address], password [facility] 04/12/2013, 12:15 PM  LOS: 20 days

## 2013-04-13 LAB — GLUCOSE, CAPILLARY
GLUCOSE-CAPILLARY: 120 mg/dL — AB (ref 70–99)
GLUCOSE-CAPILLARY: 149 mg/dL — AB (ref 70–99)
Glucose-Capillary: 131 mg/dL — ABNORMAL HIGH (ref 70–99)
Glucose-Capillary: 82 mg/dL (ref 70–99)

## 2013-04-13 LAB — BASIC METABOLIC PANEL
BUN: 62 mg/dL — ABNORMAL HIGH (ref 6–23)
CALCIUM: 8.9 mg/dL (ref 8.4–10.5)
CO2: 24 meq/L (ref 19–32)
CREATININE: 3.23 mg/dL — AB (ref 0.50–1.10)
Chloride: 100 mEq/L (ref 96–112)
GFR calc Af Amer: 19 mL/min — ABNORMAL LOW (ref >90)
GFR calc non Af Amer: 17 mL/min — ABNORMAL LOW (ref >90)
GLUCOSE: 130 mg/dL — AB (ref 70–99)
Potassium: 4.3 mEq/L (ref 3.7–5.3)
Sodium: 139 mEq/L (ref 137–147)

## 2013-04-13 MED ORDER — METOCLOPRAMIDE HCL 5 MG PO TABS: 5.0000 mg | ORAL_TABLET | Freq: Three times a day (TID) | ORAL | Status: DC

## 2013-04-13 MED ORDER — HYDROCODONE-ACETAMINOPHEN 5-325 MG PO TABS: 1.0000 | ORAL_TABLET | Freq: Four times a day (QID) | ORAL | Status: DC | PRN

## 2013-04-13 MED ORDER — NITROGLYCERIN 0.4 MG SL SUBL: 0.4000 mg | SUBLINGUAL_TABLET | SUBLINGUAL | Status: DC | PRN

## 2013-04-13 MED ORDER — RENA-VITE PO TABS: 1.0000 | ORAL_TABLET | Freq: Every day | ORAL | Status: DC

## 2013-04-13 MED ORDER — FUROSEMIDE 80 MG PO TABS: 80.0000 mg | ORAL_TABLET | Freq: Every day | ORAL | Status: DC

## 2013-04-13 MED ORDER — INSULIN GLARGINE 100 UNIT/ML ~~LOC~~ SOLN: 60.0000 [IU] | Freq: Every day | SUBCUTANEOUS | Status: DC

## 2013-04-13 MED ORDER — COLLAGENASE 250 UNIT/GM EX OINT: TOPICAL_OINTMENT | Freq: Every day | CUTANEOUS | Status: DC

## 2013-04-13 NOTE — Care Management Note (Addendum)
   CARE MANAGEMENT NOTE 04/13/2013  Patient:  Jasmine GentileMONROE,Jasmine L   Account Number:  [id number]  Date Initiated:  03/26/2013  Documentation initiated by:  DAVIS,RHONDA  Subjective/Objective Assessment:   diabetic with wound to buttock with purulent drainage requiring i&d, iv insulin ongoing for control.     Action/Plan:   home when stable  04/12/13 LTAC declined by pt insurance, plan to d/c to home pt insurance requires [facility]. This has been done.   Anticipated DC Date:  04/13/2013   Anticipated DC Plan:  HOME W HOME HEALTH SERVICES  In-house referral  NA      DC Planning Services  NA      [facility]   Choice offered to / List presented to:  NA   DME arranged  [facility]      DME agency  [facility]     HH arranged  HH-1 RN  HH-2 PT      HH agency  [facility]   Status of service:  Completed, signed off Medicare Important Message given?  NO (If response is "NO", the following Medicare IM given date fields will be blank) Date Medicare IM given:   Date Additional Medicare IM given:    Discharge Disposition:  HOME W HOME HEALTH SERVICES  Per UR Regulation:  Reviewed for med. necessity/level of care/duration of stay  If discussed at Long Length of Stay Meetings, dates discussed:   03/29/2013  04/03/2013    Comments:  04/12/2013 Notifed by pt insurance provider that LTAC is being denied. Will continue to follow and [facility] as required by pt insurance. Johny Shockheryl Sidnee Gambrill RN MPH, case manager, [phone]   ontact:  Dennis,Cedric Significant other   [phone]  04-03-13 2:35pm Avie ArenasSarah Brown, RNBSN- [phone] Tolerating [place] without precedex. In HD now - Plan for tx to M/S today.  Has [facility] benefits - Discussed case with Dr. Marchelle Gearingamaswamy - Placed [facility]ach consult, but wants psych to see first with recommendations - no psych in Ltach's.   Order placed for Ltach referral. Referral made.  [id number]/Rhonda  Stark JockDavis, RN, BSN, [place] [phone] Chart Reviewed for discharge and hospital needs. Discharge needs at time of review:  None present will follow for needs. Review of patient progress due on [id number].

## 2013-04-13 NOTE — Progress Notes (Signed)
Discharge instructions given. Verbalizes understanding. No questions or complaints at this time.

## 2013-04-13 NOTE — Discharge Summary (Signed)
Physician Discharge Summary  Jasmine [name] ZOX:[id number] DOB: 1970-03-14 DOA: 03/23/2013  PCP: Dois DavenportICHTER,KAREN L., MD  Admit date: 03/23/2013 Discharge date: 04/13/2013  Time spent: > 35 minutes  Recommendations for Outpatient Follow-up:  1. Please see below 2. Continue to follow Serum creatinine 3. Continue routine dressing changes as outlined by General surgery  Discharge Diagnoses:  Principal Problem:   DKA (diabetic ketoacidoses) Active Problems:   DIABETES MELLITUS, TYPE I   DEPRESSION   Heart failure, diastolic, chronic   Hypertension   OSA (obstructive sleep apnea)   Anemia, chronic disease   CKD (chronic kidney disease), stage IV   Gastroparesis   Left buttock abscess   Acute-on-chronic kidney injury   Sepsis   Abscess, perirectal s/p I&D 03/23/2013   Acute respiratory failure with hypoxia   Discharge Condition: stable  Diet recommendation: renal diet. Carb modified  Filed Weights   04/09/13 2203 04/10/13 2202 04/11/13 2207  Weight: 117.3 kg (258 lb 9.6 oz) 118.253 kg (260 lb 11.2 oz) 117.663 kg (259 lb 6.4 oz)    History of present illness:  Pt is a 43 y/o Who presented to the hospital with elevated blood sugars and a left buttock abscess which was s/p I and D.  Hospital Course:  DKA (diabetic ketoacidoses)/DIABETES MELLITUS, TYPE I  - Resolved  - CBG's improving  - Continue lantus; continue also SSI and meal coverage  - Patient low carb diet   Acute-on-chronic kidney injury/ CKD (chronic kidney disease), stage IV  - S Cr continues to improve - Pt to follow up with Nephrology  Sepsis  - Sepsis physiology has resolved  - Due to PNA and buttock abscess  - Antibiotic therapy completed  - Continue wound care as indicated by surgery service  - No fever or leukocytosis  - Continue to monitor   Acute respiratory failure with hypoxia:  A) Volume Overload  B) Strep PNA  C) Heart failure, diastolic, chronic  D) OSA (obstructive sleep apnea)  - PNA  initially, then subsequent volume overload from volume resuscitation in setting of ARF  - Patient approx 13-14 L off with HD; HD complete  - Restarted lasix as recommended by renal service  - Resolved  Diarrhea  - Had leukocytosis and low grade fevers  - C. Diff neg  - resolved  Hypertension  - Blood pressures well controlled off of antihypertensive medication - will d/c off of antihypertensive rx's  Gastroparesis  - Significant nausea explained by gastroparesis and uremia  - Could also be the cause of diarrhea  - Continue reglan to PO TID (well tolerated)  - Continue PRN zofran   Left buttock abscess/ Abscess, perirectal s/p I&D 03/23/2013; planning another I&D at bedside on 3/16  - Cont current wound care and follow rec's from surgery service: wet to dry dressing changes BID, santyl and hydrotherapy.  - HH wound care, family needs training prior to discharge (husband starts work daily at [employer]2PM) and doughnut pad.  - Follow up in surgery clinic three weeks from 04/10/13.  - Wound culture positive for Proteus  - Antibiotics completed 3/14  - no fever or leukocytosis   DEPRESSION and anxiety  - No SI and mood stable   Anemia, chronic disease  - Hgb stable  - received aranesp while in house   Procedures:  I and D  Consultations:  Nephrology  Discharge Exam: Filed Vitals:   04/13/13 0807  BP: 109/54  Pulse: 71  Temp: 98.9 F (37.2 C)  Resp: 18  General: Pt in NAD, Alert and awake Cardiovascular: RRR, no murmurs Respiratory: CTA BL, no wheezes  Discharge Instructions  Discharge Orders   Future Appointments Provider Department Dept Phone   05/01/2013 3:00 PM [facility], [place] [phone]   Future Orders Complete By Expires   Call MD for:  temperature >100.4  As directed    Diet - low sodium heart healthy  As directed    Discharge instructions  As directed    Comments:     F/u with your nephrologist in 04/18/13 or as directed  by them. Call their offices after discharge to confirm appointment.  F/u with General surgery within 3 weeks from discharge. Do the same as above for them.   Increase activity slowly  As directed        Medication List    STOP taking these medications       amLODipine 10 MG tablet  Commonly known as:  NORVASC     bisoprolol-hydrochlorothiazide 5-6.25 MG per tablet  Commonly known as:  ZIAC     hydrALAZINE 25 MG tablet  Commonly known as:  APRESOLINE     Hydrocodone-Acetaminophen 5-300 MG Tabs     metolazone 2.5 MG tablet  Commonly known as:  ZAROXOLYN     NOVOLOG MIX 70/30 FLEXPEN (70-30) 100 UNIT/ML Pen  Generic drug:  Insulin Aspart Prot & Aspart      TAKE these medications       acetaminophen 500 MG tablet  Commonly known as:  TYLENOL  Take 500 mg by mouth every 6 (six) hours as needed for mild pain.     albuterol 108 (90 BASE) MCG/ACT inhaler  Commonly known as:  PROVENTIL HFA;VENTOLIN HFA  Inhale 2 puffs into the lungs every 6 (six) hours as needed for wheezing.     aspirin 81 MG tablet  Take 81 mg by mouth daily as needed. For pain     atorvastatin 40 MG tablet  Commonly known as:  LIPITOR  Take 1 tablet (40 mg total) by mouth daily at 6 PM.     collagenase ointment  Commonly known as:  SANTYL  Apply topically daily.     ferrous sulfate 325 (65 FE) MG tablet  Take 325 mg by mouth 2 (two) times daily.     freestyle lancets     FREESTYLE LITE test strip  Generic drug:  glucose blood  Twice daily before meals.     furosemide 80 MG tablet  Commonly known as:  LASIX  Take 1 tablet (80 mg total) by mouth daily.     insulin glargine 100 UNIT/ML injection  Commonly known as:  LANTUS  Inject 0.6 mLs (60 Units total) into the skin at bedtime.     metoCLOPramide 5 MG tablet  Commonly known as:  REGLAN  Take 1 tablet (5 mg total) by mouth 3 (three) times daily before meals.     multivitamin Tabs tablet  Take 1 tablet by mouth at bedtime.      nitroGLYCERIN 0.4 MG SL tablet  Commonly known as:  NITROSTAT  Place 1 tablet (0.4 mg total) under the tongue every 5 (five) minutes as needed for chest pain.       Allergies  Allergen Reactions  . Contrast Media [Iodinated Diagnostic Agents] Nausea And Vomiting  . Paroxetine Nausea And Vomiting  . Oxycodone Itching and Rash       Follow-up Information   Follow up with [facility] [place] On 05/11/2013. (For post hospital check, Stage III pressure ulcer. Appointment at 3 pm but please arrive at least 30 minutes prior to fill out paper work.)    Contact information:   [address]   [place] [address] [phone]        The results of significant diagnostics from this hospitalization (including imaging, microbiology, ancillary and laboratory) are listed below for reference.    Significant Diagnostic Studies: Ct Abdomen Pelvis Wo Contrast  03/25/2013   CLINICAL DATA:  DKA. Question intra-abdominal infection/pelvic abscess. Leukocytosis. Question of IV contrast allergy.  EXAM: CT ABDOMEN AND PELVIS WITHOUT CONTRAST  TECHNIQUE: Multidetector CT imaging of the abdomen and pelvis was performed following the standard protocol without intravenous contrast.  COMPARISON:  10/19/2012 and 08/05/2009  FINDINGS: Lung bases demonstrate moderate consolidation with air bronchograms over the posterior lower lobes right worse than left likely a pneumonia. Small bilateral pleural effusions are present. Heart is normal in size.  Abdominal images demonstrate evidence of a prior cholecystectomy. There are a few small calcified splenic granulomas. Liver is within normal. The pancreas and adrenal glands are normal. Kidneys are normal in size without evidence of hydronephrosis or nephrolithiasis. Ureters are within normal. Appendix is normal. There is mild calcified plaque over the abdominal aorta and iliac vessels.  Pelvic images demonstrate a Foley catheter present within a decompressed  bladder. An IUD is in adequate position over the uterine fundus. There is evidence of several uterine fibroids unchanged. There are bilateral simple cystic appearing ovaries. No free fluid in the pelvis. No evidence of adenopathy. There is mild degenerative change of the hips.  IMPRESSION: Airspace consolidation over the posterior lower lobes right worse than left with air bronchograms suggesting multifocal pneumonia. Small bilateral pleural effusions.  No acute findings in the abdomen/pelvis.  Fibroid uterus unchanged. Simple cystic bilateral ovaries. IUD in adequate position.   Electronically Signed   By: Elberta Fortis M.D.   On: 03/25/2013 14:37   Dg Chest Port 1 View  04/04/2013   CLINICAL DATA Respiratory failure  EXAM PORTABLE CHEST - 1 VIEW  COMPARISON 04/01/2013  FINDINGS Endotracheal tube removed. NG tube removed. Right jugular catheter tip in the SVC unchanged. No pneumothorax.  Improved aeration in the lung bases. The lungs are clear. Negative for pneumonia or heart failure.  IMPRESSION Improved aeration of the lungs. Lungs are now clear following extubation  SIGNATURE  Electronically Signed   By: Marlan Palau M.D.   On: 04/04/2013 07:07   Dg Chest Port 1 View  04/01/2013   CLINICAL DATA:  Respiratory failure.  EXAM: PORTABLE CHEST - 1 VIEW  COMPARISON:  DG CHEST 1V PORT dated 03/31/2013  FINDINGS: Endotracheal tube is roughly 3.2 cm above the carina. Right central line tip in the upper SVC region. Slightly improved aeration in the lungs suggests decreased edema. However, there may be increased atelectasis or pleural fluid along the right minor fissure. Heart size is normal.  IMPRESSION: Decreased interstitial edema but there is increased fluid or atelectasis along the right minor fissure.  Support apparatuses as described.   Electronically Signed   By: Richarda Overlie M.D.   On: 04/01/2013 08:16   Dg Chest Port 1 View  03/31/2013   CLINICAL DATA:  Status post intubation  EXAM: PORTABLE CHEST - 1 VIEW   COMPARISON:  03/30/2013  FINDINGS: There is a a right IJ catheter with tip in the projection of the SVC. ET tube tip is above the  carina. There is a nasogastric tube within the stomach. Normal heart size. Bilateral pleural effusions and interstitial edema appear increased from previous exam.  IMPRESSION: 1. Worsening CHF pattern.   Electronically Signed   By: Signa Kell M.D.   On: 03/31/2013 09:17   Dg Chest Port 1 View  03/30/2013   CLINICAL DATA:  43 year old female intubated. Heart failure. Sepsis. Initial encounter.  EXAM: PORTABLE CHEST - 1 VIEW  COMPARISON:  03/29/2013 and earlier.  FINDINGS: Portable AP semi upright view at 0434 hrs. Stable endotracheal tube tip at the level the clavicles. Stable enteric tube, tip not included. Stable right IJ central line.  Decreased pulmonary interstitial opacity and improved definition of the pulmonary vasculature. No pneumothorax. No large effusion. Mildly improved retrocardiac ventilation with residual opacity.  IMPRESSION: 1.  Stable lines and tubes. 2. Regressed pulmonary edema. Improved basilar ventilation with residual retrocardiac atelectasis or consolidation.   Electronically Signed   By: Augusto Gamble M.D.   On: 03/30/2013 07:52   Dg Chest Port 1 View  03/29/2013   CLINICAL DATA:  Pneumonia and respiratory failure.  EXAM: PORTABLE CHEST - 1 VIEW  COMPARISON:  DG CHEST 1V PORT dated 03/28/2013; DG CHEST 1V PORT dated 03/28/2013; DG CHEST 1V PORT dated 03/27/2013  FINDINGS: Endotracheal tube remains present with the tip approximately 3 cm above the carina. Temporary dialysis catheter shows stable positioning. Nasogastric tube extends below the diaphragm. Lungs show some improvement in aeration bilaterally with residual edema/ airspace disease present. No significant pleural fluid is identified. The heart size is stable.  IMPRESSION: Improved aeration of both lungs.   Electronically Signed   By: Irish Lack M.D.   On: 03/29/2013 08:09   Dg Chest Port 1  View  03/28/2013   CLINICAL DATA:  Hypoxia  EXAM: PORTABLE CHEST - 1 VIEW  COMPARISON:  Study obtained earlier in the day  FINDINGS: Endotracheal tube tip is 5.1 cm above the carina. Nasogastric tube tip and side port are in the stomach. Central catheter tip is in the superior vena cava. No pneumothorax.  There is increased and alveolar edema bilaterally compared to earlier in the day. Heart is mildly prominent with normal pulmonary vascularity.  IMPRESSION: Increase in alveolar edema compared earlier in the day. Question degree of ARDS. Aspiration could present similarly and must be a differential consideration.  Tube and catheter positions as described without pneumothorax.   Electronically Signed   By: Bretta Bang M.D.   On: 03/28/2013 14:08   Dg Chest Port 1 View  03/28/2013   CLINICAL DATA:  Hypoxia  EXAM: PORTABLE CHEST - 1 VIEW  COMPARISON:  March 27, 2013  FINDINGS: Endotracheal tube tip is 5.6 cm above the carinal. Nasogastric tube tip and side port are in the stomach. Central catheter tip is in the superior vena cava. No pneumothorax.  Compared to 1 day prior, there has been significant clearing of alveolar edema. Mild interstitial edema remains. There is no airspace consolidation. Heart is prominent with normal pulmonary vascularity, stable.  IMPRESSION: Significant interval clearing of alveolar edema. Patchy interstitial edema remains bilaterally, primarily on the right. Tube and catheter positions as described without pneumothorax. Cardiac prominence remains without change.   Electronically Signed   By: Bretta Bang M.D.   On: 03/28/2013 07:09   Dg Chest Port 1 View  03/27/2013   CLINICAL DATA:  Central line placed  EXAM: PORTABLE CHEST - 1 VIEW  COMPARISON:  Chest x-ray from the same day at 1:50 a.m.  FINDINGS: Endotracheal tube has been retracted, now in the mid thoracic trachea. Orogastric tube at least reaches the diaphragm. New right IJ dual-lumen catheter, tip at the level of the  SVC. No evidence of pneumothorax or increased pleural fluid. Unchanged diffuse interstitial and airspace disease. There may be an layering effusions.  IMPRESSION: 1. New right IJ catheter.  No adverse features. 2. Endotracheal and orogastric tubes in good position. 3. Unchanged diffuse airspace disease.   Electronically Signed   By: Tiburcio Pea M.D.   On: 03/27/2013 05:07   Dg Chest Port 1 View  03/27/2013   CLINICAL DATA:  Endotracheal tube placement.  EXAM: PORTABLE CHEST - 1 VIEW  COMPARISON:  03/25/2013.  FINDINGS: Endotracheal tube tip just above the carina. Recommend retracted by 2.5 cm.  Nasogastric tube courses below the diaphragm. Tip is not included on the present exam.  Worsening of diffuse airspace disease approaching ARDS pattern.  Given the diffuse pulmonary disease limited evaluation of the mediastinal and cardiac silhouette.  No gross pneumothorax.  IMPRESSION: Endotracheal tube tip just above the carina. Recommend retracted by 2.5 cm.  Nasogastric tube courses below the diaphragm. Tip is not included on the present exam.  Worsening of diffuse airspace disease approaching ARDS pattern.  These results were called by telephone at the time of interpretation on 03/27/2013 at 2:20 AM to [facility], patient's nurse , who verbally acknowledged these results.   Electronically Signed   By: Bridgett Larsson M.D.   On: 03/27/2013 02:21   Dg Chest Port 1 View  03/25/2013   CLINICAL DATA:  Shortness breath.  EXAM: PORTABLE CHEST - 1 VIEW  COMPARISON:  03/24/2013 chest x-ray. 03/25/2013 CT abdomen and pelvis.  FINDINGS: The CT detected basilar consolidation (more notable on the right) and small pleural effusions better appreciated on recent abdominal CT.  Pulmonary vascular congestion/mild pulmonary edema is similar to prior exam.  Cardiomegaly.  No gross pneumothorax.  IMPRESSION: The CT detected basilar consolidation (more notable on the right) and small pleural effusions better appreciated on recent abdominal  CT.  Pulmonary vascular congestion/mild pulmonary edema is similar to prior exam.  Cardiomegaly.   Electronically Signed   By: Bridgett Larsson M.D.   On: 03/25/2013 17:38   Dg Chest Port 1 View  03/24/2013   CLINICAL DATA:  Acute respiratory distress.  EXAM: PORTABLE CHEST - 1 VIEW  COMPARISON:  10/30/2012  FINDINGS: There are irregularly thickened interstitial markings which predominate centrally. This is consistent with interstitial edema. Hazy basilar opacity may reflect small effusions.  Cardiac silhouette is normal in size. Normal mediastinal and hilar contours.  IMPRESSION: Findings consistent with interstitial pulmonary edema.   Electronically Signed   By: Amie Portland M.D.   On: 03/24/2013 16:37    Microbiology: Recent Results (from the past 240 hour(s))  CLOSTRIDIUM DIFFICILE BY PCR     Status: None   Collection Time    04/04/13  6:03 PM      Result Value Ref Range Status   C difficile by pcr NEGATIVE  NEGATIVE Final  URINE CULTURE     Status: None   Collection Time    04/06/13 10:18 PM      Result Value Ref Range Status   Specimen Description URINE, CLEAN CATCH   Final   Special Requests NONE   Final   Culture  Setup Time     Final   Value: 04/07/2013 04:16     Performed at [facility] Count  Final   Value: NO GROWTH     Performed at [facility]   Culture     Final   Value: NO GROWTH     Performed at [facility]   Report Status 04/08/2013 FINAL   Final     Labs: Basic Metabolic Panel:  Recent Labs Lab 04/09/13 0450 04/10/13 1027 04/11/13 0538 04/12/13 0545 04/13/13 0429  NA 134* 136* 141 138 139  K 5.2 4.7 4.7 4.3 4.3  CL 97 96 100 102 100  CO2 17* 24 25 22 24   GLUCOSE 222* 284* 85 70 130*  BUN 48* 57* 60* 60* 62*  CREATININE 4.84* 4.35* 4.00* 3.36* 3.23*  CALCIUM 9.1 9.2 9.2 8.9 8.9   Liver Function Tests: No results found for this basename: AST, ALT, ALKPHOS, BILITOT, PROT, ALBUMIN,  in the last 168 hours No results  found for this basename: LIPASE, AMYLASE,  in the last 168 hours No results found for this basename: AMMONIA,  in the last 168 hours CBC:  Recent Labs Lab 04/07/13 0559 04/11/13 0538  WBC 10.9* 9.1  HGB 10.0* 10.4*  HCT 32.1* 33.4*  MCV 91.7 91.5  PLT 417* 446*   Cardiac Enzymes: No results found for this basename: CKTOTAL, CKMB, CKMBINDEX, TROPONINI,  in the last 168 hours BNP: BNP (last 3 results)  Recent Labs  10/07/12 2126 11/01/12 1446 03/24/13 1200  PROBNP 1631.0* 2441.0* 6479.0*   CBG:  Recent Labs Lab 04/12/13 1142 04/12/13 1629 04/12/13 2049 04/12/13 2304 04/13/13 0746  GLUCAP 114* 101* 125* 160* 131*       Signed:  Penny Pia  [facility] 04/13/2013, 10:06 AM

## 2013-04-13 NOTE — Progress Notes (Signed)
Physical Therapy Wound Treatment Patient Details  Name: Jasmine [name] MRN: [id number] Date of Birth: Nov 25, 1970  Today's Date: 04/13/2013 Time: 5284-1324 Time Calculation (min): 25 min  Subjective  Subjective: I'm glad it is looking better  Pain Score: Pain Score: 0-No pain  Wound Assessment  Wound / Incision (Open or Dehisced) 03/23/13 Other (Comment) Buttocks Left abscess, I&D performed today (Active)  Dressing Type Gauze (Comment) 04/13/2013 11:49 AM  Dressing Changed Changed 04/12/2013  9:00 PM  Dressing Status Clean;Dry;Intact 04/13/2013 11:49 AM  Dressing Change Frequency Twice a day 04/13/2013 11:49 AM  Site / Wound Assessment Red;Pink;Yellow 04/13/2013 11:49 AM  % Wound base Red or Granulating 90% 04/13/2013  9:25 AM  % Wound base Yellow 10% 04/13/2013  9:25 AM  % Wound base Black 0% 04/13/2013  9:25 AM  % Wound base Other (Comment) 0% 04/13/2013  9:25 AM  Peri-wound Assessment Intact 04/13/2013 11:49 AM  Margins Unattached edges (unapproximated) 04/13/2013 11:49 AM  Closure None 04/13/2013 11:49 AM  Drainage Amount Minimal 04/13/2013 11:49 AM  Drainage Description Serous 04/13/2013 11:49 AM  Non-staged Wound Description Not applicable 04/26/270  5:36 PM  Treatment Packing (Dry gauze) 04/13/2013 11:49 AM   Hydrotherapy Pulsed lavage therapy - wound location: Lt buttock Pulsed Lavage with Suction (psi): 8 psi Pulsed Lavage with Suction - Normal Saline Used: 1000 mL Pulsed Lavage Tip: Tip with splash shield Selective Debridement Selective Debridement - Location: Lt buttock Selective Debridement - Tools Used: Forceps;Scissors Selective Debridement - Tissue Removed: yellow slough   Provided husband instruction on how to change dressing.  Husband observed hydro session including dressing change.  Husband able to verbalize proper technique.  Wound Assessment and Plan  Wound Therapy - Assess/Plan/Recommendations Wound Therapy - Clinical Statement: Wound continues to  improve. Hydrotherapy Plan: Debridement;Dressing change;Patient/family education;Pulsatile lavage with suction Wound Therapy - Frequency: 6X / week Wound Therapy - Follow Up Recommendations: Home health RN Wound Plan: see above  Wound Therapy Goals- Improve the function of patient's integumentary system by progressing the wound(s) through the phases of wound healing (inflammation - proliferation - remodeling) by: Decrease Necrotic Tissue to: 10 Decrease Necrotic Tissue - Progress: Met Increase Granulation Tissue to: 90 Increase Granulation Tissue - Progress: Met Patient/Family will be able to : verbalize appropriate positioning and pressure relief to promote wound healing. Patient/Family Instruction Goal - Progress: Met  Goals will be updated until maximal potential achieved or discharge criteria met.  Discharge criteria: when goals achieved, discharge from hospital, MD decision/surgical intervention, no progress towards goals, refusal/missing three consecutive treatments without notification or medical reason.  GP     Despina Pole 04/13/2013, 12:58 PM Carita Pian. Sanjuana Kava, [place] Pager [phone]

## 2013-04-13 NOTE — Progress Notes (Signed)
Discharge home via wheelchair.  

## 2013-05-01 ENCOUNTER — Ambulatory Visit (INDEPENDENT_AMBULATORY_CARE_PROVIDER_SITE_OTHER): Payer: 59 | Admitting: General Surgery

## 2013-05-01 VITALS — BP 132/82 | HR 90 | Resp 16 | Ht 67.0 in | Wt 273.8 lb

## 2013-05-01 DIAGNOSIS — L0231 Cutaneous abscess of buttock: Secondary | ICD-10-CM

## 2013-05-01 DIAGNOSIS — L03317 Cellulitis of buttock: Secondary | ICD-10-CM

## 2013-05-01 NOTE — Patient Instructions (Signed)
Continue dressing changes daily. 

## 2013-05-01 NOTE — Progress Notes (Signed)
Jasmine [name] [id number] 1970/04/22  This is a 43 yo female who [facility]  [facility] was transferred to [facility] due to need for HD.  Her wound began to break down and she developed a pressure ulcer near this abscess cavity.  She had a bedside debridement of this wound.  She returns today for a wound check.  PE: Left buttock wound is about 50 cent piece size.  It is 100% clean with beefy red granulation tissue.  There is slight undermining of the lateral edge around 9 oclock up to 11 oclock.  A/P: 1. S/p I&D of buttock abscess and debridement of pressure ulcer   Plan: 1. Cont NS WD dressing changes daily to wound.  We will see in clinic for repeat wound check in 3 weeks.

## 2013-05-09 ENCOUNTER — Encounter (HOSPITAL_COMMUNITY): Payer: Self-pay | Admitting: Cardiology

## 2013-05-09 ENCOUNTER — Telehealth (HOSPITAL_COMMUNITY): Payer: Self-pay | Admitting: Cardiology

## 2013-05-09 NOTE — Telephone Encounter (Signed)
Attempting to schedule 3 month follow up I have been unable to reach this patient by phone.  A letter is being sent to the last known home address.  

## 2013-05-22 ENCOUNTER — Encounter (INDEPENDENT_AMBULATORY_CARE_PROVIDER_SITE_OTHER): Payer: 59

## 2013-05-24 ENCOUNTER — Encounter (INDEPENDENT_AMBULATORY_CARE_PROVIDER_SITE_OTHER): Payer: Self-pay | Admitting: General Surgery

## 2013-05-25 ENCOUNTER — Encounter (HOSPITAL_COMMUNITY): Payer: Self-pay | Admitting: Emergency Medicine

## 2013-05-25 ENCOUNTER — Inpatient Hospital Stay (HOSPITAL_COMMUNITY): Payer: 59

## 2013-05-25 ENCOUNTER — Emergency Department (HOSPITAL_COMMUNITY): Payer: 59

## 2013-05-25 ENCOUNTER — Inpatient Hospital Stay (HOSPITAL_COMMUNITY)
Admission: EM | Admit: 2013-05-25 | Discharge: 2013-05-30 | DRG: 280 | Disposition: A | Discharge status: Home / Self Care | Payer: 59 | Attending: Internal Medicine | Admitting: Internal Medicine

## 2013-05-25 DIAGNOSIS — N184 Chronic kidney disease, stage 4 (severe): Secondary | ICD-10-CM

## 2013-05-25 DIAGNOSIS — D72829 Elevated white blood cell count, unspecified: Secondary | ICD-10-CM | POA: Diagnosis present

## 2013-05-25 DIAGNOSIS — I214 Non-ST elevation (NSTEMI) myocardial infarction: Secondary | ICD-10-CM | POA: Diagnosis not present

## 2013-05-25 DIAGNOSIS — Z885 Allergy status to narcotic agent status: Secondary | ICD-10-CM

## 2013-05-25 DIAGNOSIS — I5033 Acute on chronic diastolic (congestive) heart failure: Principal | ICD-10-CM | POA: Diagnosis present

## 2013-05-25 DIAGNOSIS — I252 Old myocardial infarction: Secondary | ICD-10-CM

## 2013-05-25 DIAGNOSIS — M79602 Pain in left arm: Secondary | ICD-10-CM

## 2013-05-25 DIAGNOSIS — Z7982 Long term (current) use of aspirin: Secondary | ICD-10-CM

## 2013-05-25 DIAGNOSIS — E1010 Type 1 diabetes mellitus with ketoacidosis without coma: Secondary | ICD-10-CM | POA: Diagnosis present

## 2013-05-25 DIAGNOSIS — R609 Edema, unspecified: Secondary | ICD-10-CM | POA: Diagnosis present

## 2013-05-25 DIAGNOSIS — D638 Anemia in other chronic diseases classified elsewhere: Secondary | ICD-10-CM | POA: Diagnosis present

## 2013-05-25 DIAGNOSIS — F32A Depression, unspecified: Secondary | ICD-10-CM | POA: Diagnosis present

## 2013-05-25 DIAGNOSIS — J9601 Acute respiratory failure with hypoxia: Secondary | ICD-10-CM

## 2013-05-25 DIAGNOSIS — Z5987 Material hardship due to limited financial resources, not elsewhere classified: Secondary | ICD-10-CM

## 2013-05-25 DIAGNOSIS — E1065 Type 1 diabetes mellitus with hyperglycemia: Secondary | ICD-10-CM

## 2013-05-25 DIAGNOSIS — Z91041 Radiographic dye allergy status: Secondary | ICD-10-CM

## 2013-05-25 DIAGNOSIS — M79605 Pain in left leg: Secondary | ICD-10-CM

## 2013-05-25 DIAGNOSIS — F329 Major depressive disorder, single episode, unspecified: Secondary | ICD-10-CM | POA: Diagnosis present

## 2013-05-25 DIAGNOSIS — A419 Sepsis, unspecified organism: Secondary | ICD-10-CM

## 2013-05-25 DIAGNOSIS — F410 Panic disorder [episodic paroxysmal anxiety] without agoraphobia: Secondary | ICD-10-CM

## 2013-05-25 DIAGNOSIS — K3184 Gastroparesis: Secondary | ICD-10-CM | POA: Diagnosis present

## 2013-05-25 DIAGNOSIS — E109 Type 1 diabetes mellitus without complications: Secondary | ICD-10-CM

## 2013-05-25 DIAGNOSIS — E785 Hyperlipidemia, unspecified: Secondary | ICD-10-CM | POA: Diagnosis present

## 2013-05-25 DIAGNOSIS — Z888 Allergy status to other drugs, medicaments and biological substances status: Secondary | ICD-10-CM

## 2013-05-25 DIAGNOSIS — N189 Chronic kidney disease, unspecified: Secondary | ICD-10-CM

## 2013-05-25 DIAGNOSIS — Z598 Other problems related to housing and economic circumstances: Secondary | ICD-10-CM

## 2013-05-25 DIAGNOSIS — J45909 Unspecified asthma, uncomplicated: Secondary | ICD-10-CM | POA: Diagnosis present

## 2013-05-25 DIAGNOSIS — E1139 Type 2 diabetes mellitus with other diabetic ophthalmic complication: Secondary | ICD-10-CM

## 2013-05-25 DIAGNOSIS — E1110 Type 2 diabetes mellitus with ketoacidosis without coma: Secondary | ICD-10-CM | POA: Diagnosis present

## 2013-05-25 DIAGNOSIS — F430 Acute stress reaction: Secondary | ICD-10-CM

## 2013-05-25 DIAGNOSIS — M7989 Other specified soft tissue disorders: Secondary | ICD-10-CM

## 2013-05-25 DIAGNOSIS — I2510 Atherosclerotic heart disease of native coronary artery without angina pectoris: Secondary | ICD-10-CM | POA: Diagnosis present

## 2013-05-25 DIAGNOSIS — K611 Rectal abscess: Secondary | ICD-10-CM

## 2013-05-25 DIAGNOSIS — R0602 Shortness of breath: Secondary | ICD-10-CM

## 2013-05-25 DIAGNOSIS — G4733 Obstructive sleep apnea (adult) (pediatric): Secondary | ICD-10-CM

## 2013-05-25 DIAGNOSIS — E669 Obesity, unspecified: Secondary | ICD-10-CM | POA: Diagnosis present

## 2013-05-25 DIAGNOSIS — Z8249 Family history of ischemic heart disease and other diseases of the circulatory system: Secondary | ICD-10-CM

## 2013-05-25 DIAGNOSIS — G5600 Carpal tunnel syndrome, unspecified upper limb: Secondary | ICD-10-CM

## 2013-05-25 DIAGNOSIS — I509 Heart failure, unspecified: Secondary | ICD-10-CM | POA: Diagnosis present

## 2013-05-25 DIAGNOSIS — R8271 Bacteriuria: Secondary | ICD-10-CM

## 2013-05-25 DIAGNOSIS — E871 Hypo-osmolality and hyponatremia: Secondary | ICD-10-CM | POA: Diagnosis present

## 2013-05-25 DIAGNOSIS — Z79899 Other long term (current) drug therapy: Secondary | ICD-10-CM

## 2013-05-25 DIAGNOSIS — N183 Chronic kidney disease, stage 3 unspecified: Secondary | ICD-10-CM | POA: Diagnosis present

## 2013-05-25 DIAGNOSIS — E875 Hyperkalemia: Secondary | ICD-10-CM | POA: Diagnosis present

## 2013-05-25 DIAGNOSIS — I5032 Chronic diastolic (congestive) heart failure: Secondary | ICD-10-CM

## 2013-05-25 DIAGNOSIS — M79604 Pain in right leg: Secondary | ICD-10-CM

## 2013-05-25 DIAGNOSIS — I10 Essential (primary) hypertension: Secondary | ICD-10-CM

## 2013-05-25 DIAGNOSIS — J9600 Acute respiratory failure, unspecified whether with hypoxia or hypercapnia: Secondary | ICD-10-CM | POA: Diagnosis present

## 2013-05-25 DIAGNOSIS — R05 Cough: Secondary | ICD-10-CM

## 2013-05-25 DIAGNOSIS — M79609 Pain in unspecified limb: Secondary | ICD-10-CM

## 2013-05-25 DIAGNOSIS — Z6841 Body Mass Index (BMI) 40.0 and over, adult: Secondary | ICD-10-CM

## 2013-05-25 DIAGNOSIS — L98499 Non-pressure chronic ulcer of skin of other sites with unspecified severity: Secondary | ICD-10-CM | POA: Diagnosis present

## 2013-05-25 DIAGNOSIS — F3289 Other specified depressive episodes: Secondary | ICD-10-CM

## 2013-05-25 DIAGNOSIS — Z794 Long term (current) use of insulin: Secondary | ICD-10-CM

## 2013-05-25 DIAGNOSIS — R059 Cough, unspecified: Secondary | ICD-10-CM

## 2013-05-25 DIAGNOSIS — N179 Acute kidney failure, unspecified: Secondary | ICD-10-CM

## 2013-05-25 DIAGNOSIS — F411 Generalized anxiety disorder: Secondary | ICD-10-CM | POA: Diagnosis present

## 2013-05-25 DIAGNOSIS — L0231 Cutaneous abscess of buttock: Secondary | ICD-10-CM

## 2013-05-25 DIAGNOSIS — R109 Unspecified abdominal pain: Secondary | ICD-10-CM

## 2013-05-25 DIAGNOSIS — Z91199 Patient's noncompliance with other medical treatment and regimen due to unspecified reason: Secondary | ICD-10-CM

## 2013-05-25 DIAGNOSIS — Z9119 Patient's noncompliance with other medical treatment and regimen: Secondary | ICD-10-CM

## 2013-05-25 DIAGNOSIS — J309 Allergic rhinitis, unspecified: Secondary | ICD-10-CM

## 2013-05-25 DIAGNOSIS — I129 Hypertensive chronic kidney disease with stage 1 through stage 4 chronic kidney disease, or unspecified chronic kidney disease: Secondary | ICD-10-CM | POA: Diagnosis present

## 2013-05-25 DIAGNOSIS — E1049 Type 1 diabetes mellitus with other diabetic neurological complication: Secondary | ICD-10-CM | POA: Diagnosis present

## 2013-05-25 LAB — COMPREHENSIVE METABOLIC PANEL
ALT: 14 U/L (ref 0–35)
AST: 12 U/L (ref 0–37)
Albumin: 3.7 g/dL (ref 3.5–5.2)
Alkaline Phosphatase: 105 U/L (ref 39–117)
BILIRUBIN TOTAL: 0.4 mg/dL (ref 0.3–1.2)
BUN: 64 mg/dL — ABNORMAL HIGH (ref 6–23)
CO2: 10 meq/L — AB (ref 19–32)
Calcium: 8.8 mg/dL (ref 8.4–10.5)
Chloride: 91 mEq/L — ABNORMAL LOW (ref 96–112)
Creatinine, Ser: 2.02 mg/dL — ABNORMAL HIGH (ref 0.50–1.10)
GFR calc Af Amer: 34 mL/min — ABNORMAL LOW (ref >90)
GFR, EST NON AFRICAN AMERICAN: 29 mL/min — AB (ref >90)
GLUCOSE: 701 mg/dL — AB (ref 70–99)
Potassium: 5.4 mEq/L — ABNORMAL HIGH (ref 3.7–5.3)
SODIUM: 126 meq/L — AB (ref 137–147)
Total Protein: 8.1 g/dL (ref 6.0–8.3)

## 2013-05-25 LAB — BASIC METABOLIC PANEL
BUN: 67 mg/dL — ABNORMAL HIGH (ref 6–23)
BUN: 71 mg/dL — ABNORMAL HIGH (ref 6–23)
BUN: 71 mg/dL — ABNORMAL HIGH (ref 6–23)
CALCIUM: 8.7 mg/dL (ref 8.4–10.5)
CALCIUM: 9.1 mg/dL (ref 8.4–10.5)
CHLORIDE: 101 meq/L (ref 96–112)
CO2: 11 meq/L — AB (ref 19–32)
CO2: 15 mEq/L — ABNORMAL LOW (ref 19–32)
CO2: 13 mEq/L — ABNORMAL LOW (ref 19–32)
CREATININE: 2.09 mg/dL — AB (ref 0.50–1.10)
Calcium: 8.9 mg/dL (ref 8.4–10.5)
Chloride: 93 mEq/L — ABNORMAL LOW (ref 96–112)
Chloride: 99 mEq/L (ref 96–112)
Creatinine, Ser: 2.28 mg/dL — ABNORMAL HIGH (ref 0.50–1.10)
Creatinine, Ser: 2.15 mg/dL — ABNORMAL HIGH (ref 0.50–1.10)
GFR calc non Af Amer: 28 mL/min — ABNORMAL LOW (ref >90)
GFR, EST AFRICAN AMERICAN: 33 mL/min — AB (ref >90)
GFR, EST AFRICAN AMERICAN: 29 mL/min — AB (ref >90)
GFR, EST AFRICAN AMERICAN: 31 mL/min — AB (ref >90)
GFR, EST NON AFRICAN AMERICAN: 25 mL/min — AB (ref >90)
GFR, EST NON AFRICAN AMERICAN: 27 mL/min — AB (ref >90)
Glucose, Bld: 598 mg/dL (ref 70–99)
Glucose, Bld: 139 mg/dL — ABNORMAL HIGH (ref 70–99)
Glucose, Bld: 230 mg/dL — ABNORMAL HIGH (ref 70–99)
POTASSIUM: 4.8 meq/L (ref 3.7–5.3)
Potassium: 4.9 mEq/L (ref 3.7–5.3)
Potassium: 4.5 mEq/L (ref 3.7–5.3)
SODIUM: 129 meq/L — AB (ref 137–147)
SODIUM: 132 meq/L — AB (ref 137–147)
SODIUM: 136 meq/L — AB (ref 137–147)

## 2013-05-25 LAB — BLOOD GAS, ARTERIAL
ACID-BASE DEFICIT: 10 mmol/L — AB (ref 0.0–2.0)
BICARBONATE: 13.8 meq/L — AB (ref 20.0–24.0)
Drawn by: 310571
FIO2: 0.4 %
O2 Content: 4.5 L/min
O2 Saturation: 90.5 %
PH ART: 7.362 (ref 7.350–7.450)
Patient temperature: 98.6
TCO2: 12.9 mmol/L (ref 0–100)
pCO2 arterial: 24.9 mmHg — ABNORMAL LOW (ref 35.0–45.0)
pO2, Arterial: 58.6 mmHg — ABNORMAL LOW (ref 80.0–100.0)

## 2013-05-25 LAB — CBC
HEMATOCRIT: 31.2 % — AB (ref 36.0–46.0)
HEMOGLOBIN: 9.9 g/dL — AB (ref 12.0–15.0)
MCH: 28.6 pg (ref 26.0–34.0)
MCHC: 31.7 g/dL (ref 30.0–36.0)
MCV: 90.2 fL (ref 78.0–100.0)
Platelets: 342 10*3/uL (ref 150–400)
RBC: 3.46 MIL/uL — ABNORMAL LOW (ref 3.87–5.11)
RDW: 14.5 % (ref 11.5–15.5)
WBC: 17.3 10*3/uL — AB (ref 4.0–10.5)

## 2013-05-25 LAB — CBG MONITORING, ED
GLUCOSE-CAPILLARY: 382 mg/dL — AB (ref 70–99)
GLUCOSE-CAPILLARY: 154 mg/dL — AB (ref 70–99)
Glucose-Capillary: >600 mg/dL (ref 70–99)
Glucose-Capillary: 513 mg/dL — ABNORMAL HIGH (ref 70–99)
Glucose-Capillary: 202 mg/dL — ABNORMAL HIGH (ref 70–99)

## 2013-05-25 LAB — D-DIMER, QUANTITATIVE: D-Dimer, Quant: 2.62 ug/mL-FEU — ABNORMAL HIGH (ref 0.00–0.48)

## 2013-05-25 LAB — GLUCOSE, CAPILLARY
GLUCOSE-CAPILLARY: 134 mg/dL — AB (ref 70–99)
GLUCOSE-CAPILLARY: 225 mg/dL — AB (ref 70–99)
Glucose-Capillary: 196 mg/dL — ABNORMAL HIGH (ref 70–99)

## 2013-05-25 LAB — PRO B NATRIURETIC PEPTIDE: Pro B Natriuretic peptide (BNP): 3258 pg/mL — ABNORMAL HIGH (ref 0–125)

## 2013-05-25 LAB — MRSA PCR SCREENING: MRSA by PCR: POSITIVE — AB

## 2013-05-25 MED ORDER — IPRATROPIUM-ALBUTEROL 0.5-2.5 (3) MG/3ML IN SOLN
3.0000 mL | Freq: Once | RESPIRATORY_TRACT | Status: AC
  Administered 2013-05-25: 3 mL via RESPIRATORY_TRACT
  Filled 2013-05-25: qty 3

## 2013-05-25 MED ORDER — ALBUTEROL SULFATE (2.5 MG/3ML) 0.083% IN NEBU
2.5000 mg | INHALATION_SOLUTION | Freq: Four times a day (QID) | RESPIRATORY_TRACT | Status: DC | PRN
  Administered 2013-05-25: 2.5 mg via RESPIRATORY_TRACT
  Filled 2013-05-25: qty 3

## 2013-05-25 MED ORDER — ENOXAPARIN SODIUM 100 MG/ML ~~LOC~~ SOLN
120.0000 mg | Freq: Once | SUBCUTANEOUS | Status: DC
  Filled 2013-05-25: qty 2

## 2013-05-25 MED ORDER — FUROSEMIDE 10 MG/ML IJ SOLN
60.0000 mg | Freq: Two times a day (BID) | INTRAMUSCULAR | Status: DC
  Administered 2013-05-25 – 2013-05-26 (×3): 60 mg via INTRAVENOUS
  Filled 2013-05-25: qty 8
  Filled 2013-05-25 (×3): qty 6

## 2013-05-25 MED ORDER — ALBUTEROL SULFATE HFA 108 (90 BASE) MCG/ACT IN AERS: 2.0000 | INHALATION_SPRAY | Freq: Four times a day (QID) | RESPIRATORY_TRACT | Status: DC | PRN

## 2013-05-25 MED ORDER — HYDROMORPHONE HCL PF 1 MG/ML IJ SOLN
1.0000 mg | Freq: Once | INTRAMUSCULAR | Status: AC
  Administered 2013-05-25: 1 mg via INTRAVENOUS
  Filled 2013-05-25: qty 1

## 2013-05-25 MED ORDER — IPRATROPIUM-ALBUTEROL 0.5-2.5 (3) MG/3ML IN SOLN
3.0000 mL | Freq: Four times a day (QID) | RESPIRATORY_TRACT | Status: DC | PRN
  Administered 2013-05-25 – 2013-05-27 (×3): 3 mL via RESPIRATORY_TRACT
  Filled 2013-05-25 (×4): qty 3

## 2013-05-25 MED ORDER — ALBUTEROL SULFATE (2.5 MG/3ML) 0.083% IN NEBU
2.5000 mg | INHALATION_SOLUTION | Freq: Once | RESPIRATORY_TRACT | Status: AC
  Administered 2013-05-25: 2.5 mg via RESPIRATORY_TRACT
  Filled 2013-05-25: qty 3

## 2013-05-25 MED ORDER — IPRATROPIUM BROMIDE 0.02 % IN SOLN: 0.5000 mg | Freq: Once | RESPIRATORY_TRACT | Status: DC

## 2013-05-25 MED ORDER — TECHNETIUM TC 99M DIETHYLENETRIAME-PENTAACETIC ACID: 38.0000 | Freq: Once | INTRAVENOUS | Status: AC | PRN

## 2013-05-25 MED ORDER — SODIUM CHLORIDE 0.9 % IV SOLN
INTRAVENOUS | Status: DC
Start: 2013-05-25 — End: 2013-05-27
  Administered 2013-05-25: 5.4 [IU]/h via INTRAVENOUS
  Administered 2013-05-26: 5.5 [IU]/h via INTRAVENOUS
  Filled 2013-05-25 (×2): qty 1

## 2013-05-25 MED ORDER — TECHNETIUM TO 99M ALBUMIN AGGREGATED: 5.0000 | Freq: Once | INTRAVENOUS | Status: AC | PRN

## 2013-05-25 MED ORDER — ENOXAPARIN SODIUM 120 MG/0.8ML ~~LOC~~ SOLN
120.0000 mg | Freq: Once | SUBCUTANEOUS | Status: AC
  Administered 2013-05-25: 120 mg via SUBCUTANEOUS
  Filled 2013-05-25: qty 0.8

## 2013-05-25 MED ORDER — HYDROCODONE-ACETAMINOPHEN 5-325 MG PO TABS
1.0000 | ORAL_TABLET | Freq: Four times a day (QID) | ORAL | Status: DC | PRN
  Administered 2013-05-25 – 2013-05-28 (×2): 1 via ORAL
  Filled 2013-05-25 (×2): qty 1

## 2013-05-25 MED ORDER — LORAZEPAM 2 MG/ML IJ SOLN
1.0000 mg | Freq: Once | INTRAMUSCULAR | Status: AC
  Administered 2013-05-25: 2 mg via INTRAVENOUS
  Filled 2013-05-25: qty 1

## 2013-05-25 MED ORDER — ENOXAPARIN SODIUM 60 MG/0.6ML ~~LOC~~ SOLN
60.0000 mg | SUBCUTANEOUS | Status: DC
  Administered 2013-05-26: 60 mg via SUBCUTANEOUS
  Filled 2013-05-25: qty 0.6

## 2013-05-25 MED ORDER — SODIUM CHLORIDE 0.9 % IV SOLN: 1000.0000 mL | Freq: Once | INTRAVENOUS | Status: DC

## 2013-05-25 MED ORDER — CHLORHEXIDINE GLUCONATE CLOTH 2 % EX PADS
6.0000 | MEDICATED_PAD | Freq: Every day | CUTANEOUS | Status: DC
  Administered 2013-05-26 (×2): 6 via TOPICAL

## 2013-05-25 MED ORDER — ENOXAPARIN SODIUM 60 MG/0.6ML ~~LOC~~ SOLN
60.0000 mg | SUBCUTANEOUS | Status: DC
  Filled 2013-05-25: qty 0.6

## 2013-05-25 MED ORDER — ENOXAPARIN SODIUM 30 MG/0.3ML ~~LOC~~ SOLN: 30.0000 mg | SUBCUTANEOUS | Status: DC

## 2013-05-25 MED ORDER — AMLODIPINE BESYLATE 10 MG PO TABS
10.0000 mg | ORAL_TABLET | Freq: Every day | ORAL | Status: DC
  Administered 2013-05-25 – 2013-05-30 (×6): 10 mg via ORAL
  Filled 2013-05-25 (×6): qty 1

## 2013-05-25 MED ORDER — DEXTROSE-NACL 5-0.45 % IV SOLN
INTRAVENOUS | Status: DC
  Administered 2013-05-25: 13:00:00 via INTRAVENOUS

## 2013-05-25 MED ORDER — SODIUM CHLORIDE 0.9 % IV SOLN: 1000.0000 mL | INTRAVENOUS | Status: DC

## 2013-05-25 MED ORDER — MUPIROCIN 2 % EX OINT
1.0000 "application " | TOPICAL_OINTMENT | Freq: Two times a day (BID) | CUTANEOUS | Status: DC
  Administered 2013-05-25 – 2013-05-29 (×8): 1 via NASAL
  Filled 2013-05-25 (×2): qty 22

## 2013-05-25 MED ORDER — ALBUTEROL SULFATE HFA 108 (90 BASE) MCG/ACT IN AERS
2.0000 | INHALATION_SPRAY | RESPIRATORY_TRACT | Status: DC | PRN
  Administered 2013-05-25: 2 via RESPIRATORY_TRACT
  Filled 2013-05-25: qty 6.7

## 2013-05-25 MED ORDER — ALBUTEROL SULFATE (2.5 MG/3ML) 0.083% IN NEBU: 5.0000 mg | INHALATION_SOLUTION | Freq: Once | RESPIRATORY_TRACT | Status: DC

## 2013-05-25 MED ORDER — FUROSEMIDE 10 MG/ML IJ SOLN: 40.0000 mg | Freq: Two times a day (BID) | INTRAMUSCULAR | Status: DC

## 2013-05-25 NOTE — ED Notes (Signed)
Critical Lab result rec'd at this time, glucose = 598.  Hospitalist paged to make aware.

## 2013-05-25 NOTE — ED Notes (Signed)
Oxygen saturation up to 86%  Increased to 4 liters/min via [place]

## 2013-05-25 NOTE — ED Provider Notes (Signed)
CSN: [id number]     Arrival date & time 05/25/13  [id number] History   First MD Initiated Contact with Patient 05/25/13 [phone]     Chief Complaint  Patient presents with  . Shortness of Breath      HPI Patient reports pain in her left shoulder left upper arm that began yesterday.  Her pain is mild to moderate in severity.  She's had this before.  She tried hydrocodone without improvement in her symptoms.  She also awoke with increased shortness of breath.  She has a history of asthma.  She states she seen her doctor or the past 2 days and received albuterol and steroids without improvement in her breathing.  No history of DVT or pulmonary embolism.  Does have a history of chronic kidney disease, diastolic heart failure, uncontrolled diabetes.  Her blood sugar on arrival to emergency departments greater than 600.  She also has a history of nonobstructive coronary artery disease.  She has had DKA before in the past.   Past Medical History  Diagnosis Date  . Diastolic heart failure     echo 11/03/10 EF 55-60%  . Hypertension   . Chronic kidney disease (CKD)     stage II-III  . Hyperlipidemia   . Uncontrolled diabetes mellitus   . Diabetic ketoacidosis     with seizures  . Anemia   . Diabetic gastroparesis   . Obesity   . Coronary artery disease     mild per cath 2010  . 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 UTI (lower urinary tract infection) 01/19/2012  . Pneumonia due to unspecified Streptococcus 10/16/2012  . Acute on chronic diastolic heart failure 01/18/2012  . Acute on chronic renal failure 01/18/2012  . Acute respiratory failure with hypoxia 10/08/2012   Past Surgical History  Procedure Laterality Date  . Cholecystectomy    . Carpal tunnel release  2003   Family History  Problem Relation Age of Onset  . Heart attack Mother   . Dementia Father    History  Substance Use Topics  . Smoking status: Never Smoker   . Smokeless tobacco: Never Used  . Alcohol Use: Yes      Comment: occasionally   OB History   Grav Para Term Preterm Abortions TAB SAB Ect Mult Living                 Review of Systems  All other systems reviewed and are negative.     Allergies  Contrast media; Paroxetine; and Oxycodone  Home Medications   Prior to Admission medications   Medication Sig Start Date End Date Taking? Authorizing Provider  acetaminophen (TYLENOL) 500 MG tablet Take 500 mg by mouth every 6 (six) hours as needed for mild pain.    Historical Provider, MD  albuterol (PROVENTIL HFA;VENTOLIN HFA) 108 (90 BASE) MCG/ACT inhaler Inhale 2 puffs into the lungs every 6 (six) hours as needed for wheezing. 10/25/12   Amy D Filbert Schilderlegg, NP  aspirin 81 MG tablet Take 81 mg by mouth daily as needed. For pain    Historical Provider, MD  atorvastatin (LIPITOR) 40 MG tablet Take 1 tablet (40 mg total) by mouth daily at 6 PM. 11/20/12   Dolores Pattyaniel R [name], MD  collagenase (SANTYL) ointment Apply topically daily. 04/13/13   Penny Piarlando Vega, MD  ferrous sulfate 325 (65 FE) MG tablet Take 325 mg by mouth 2 (two) times daily.      Historical Provider, MD  FREESTYLE LITE test strip  Twice daily before meals. 11/11/10   Historical Provider, MD  furosemide (LASIX) 80 MG tablet Take 1 tablet (80 mg total) by mouth daily. 04/13/13   Penny Pia, MD  HYDROcodone-acetaminophen (NORCO) 5-325 MG per tablet Take 1 tablet by mouth every 6 (six) hours as needed for moderate pain. 04/13/13   Penny Pia, MD  insulin glargine (LANTUS) 100 UNIT/ML injection Inject 0.6 mLs (60 Units total) into the skin at bedtime. 04/13/13   Penny Pia, MD  Lancets (FREESTYLE) lancets  11/11/10   Historical Provider, MD  metoCLOPramide (REGLAN) 5 MG tablet Take 1 tablet (5 mg total) by mouth 3 (three) times daily before meals. 04/13/13   Penny Pia, MD  multivitamin (RENA-VIT) TABS tablet Take 1 tablet by mouth at bedtime. 04/13/13   Penny Pia, MD  nitroGLYCERIN (NITROSTAT) 0.4 MG SL tablet Place 1 tablet (0.4 mg  total) under the tongue every 5 (five) minutes as needed for chest pain. 04/13/13   Penny Pia, MD   BP 170/84  Pulse 132  Temp(Src) 97.5 F (36.4 C) (Oral)  Resp 22  Ht 5\' 7"  (1.702 m)  Wt 275 lb (124.739 kg)  BMI 43.06 kg/m2  SpO2 93% Physical Exam  Nursing note and vitals reviewed. Constitutional: She is oriented to person, place, and time. She appears well-developed and well-nourished. No distress.  Anxious appearing  HENT:  Head: Normocephalic and atraumatic.  Eyes: EOM are normal.  Neck: Normal range of motion.  Cardiovascular: Regular rhythm and normal heart sounds.   Tachycardia.  Pulmonary/Chest: Effort normal and breath sounds normal.  Abdominal: Soft. She exhibits no distension. There is no tenderness.  Musculoskeletal: Normal range of motion.  For his motion of left wrist, left elbow, left shoulder.  No focal tenderness or swelling of her left upper extremity as compared to right.  Neurological: She is alert and oriented to person, place, and time.  Skin: Skin is warm and dry.  Psychiatric: Judgment normal.  Anxious appearing    ED Course  Procedures   CRITICAL CARE Performed by: Lyanne Co Total critical care time: 35 Critical care time was exclusive of separately billable procedures and treating other patients. Critical care was necessary to treat or prevent imminent or life-threatening deterioration. Critical care was time spent personally by me on the following activities: development of treatment plan with patient and/or surrogate as well as nursing, discussions with consultants, evaluation of patient's response to treatment, examination of patient, obtaining history from patient or surrogate, ordering and performing treatments and interventions, ordering and review of laboratory studies, ordering and review of radiographic studies, pulse oximetry and re-evaluation of patient's condition.   Labs Review Labs Reviewed  CBC - Abnormal; Notable for the  following:    WBC 17.3 (*)    RBC 3.46 (*)    Hemoglobin 9.9 (*)    HCT 31.2 (*)    All other components within normal limits  PRO B NATRIURETIC PEPTIDE - Abnormal; Notable for the following:    Pro B Natriuretic peptide (BNP) 3258.0 (*)    All other components within normal limits  COMPREHENSIVE METABOLIC PANEL - Abnormal; Notable for the following:    Sodium 126 (*)    Potassium 5.4 (*)    Chloride 91 (*)    CO2 10 (*)    Glucose, Bld 701 (*)    BUN 64 (*)    Creatinine, Ser 2.02 (*)    GFR calc non Af Amer 29 (*)    GFR calc Af Denyse Dago  34 (*)    All other components within normal limits  D-DIMER, QUANTITATIVE - Abnormal; Notable for the following:    D-Dimer, Quant 2.62 (*)    All other components within normal limits  CBG MONITORING, ED - Abnormal; Notable for the following:    Glucose-Capillary >600 (*)    All other components within normal limits    Imaging Review Dg Chest 2 View  05/25/2013   CLINICAL DATA:  Cough and severe shortness of breath. History of asthma.  EXAM: CHEST  2 VIEW  COMPARISON:  Chest radiograph from 04/04/2013  FINDINGS: The lungs are well-aerated. Vascular congestion is noted. Mildly increased interstitial markings could reflect mild interstitial edema. Trace fluid is seen tracking along the major fissures. There is no evidence of pneumothorax.  The heart is borderline enlarged. Mild hilar prominence is nonspecific. No acute osseous abnormalities are seen.  IMPRESSION: Vascular congestion and borderline cardiomegaly; mildly increased interstitial markings could reflect mild interstitial edema.   Electronically Signed   By: Roanna RaiderJeffery  Chang M.D.   On: 05/25/2013 06:31  I personally reviewed the imaging tests through PACS system I reviewed available ER/hospitalization records through the EMR    EKG Interpretation   Date/Time:  Friday May 25 2013 05:28:02 EDT Ventricular Rate:  127 PR Interval:  153 QRS Duration: 84 QT Interval:  330 QTC Calculation:  480 R Axis:   34 Text Interpretation:  Sinus tachycardia Ventricular premature complex  Anterior infarct, old Borderline repolarization abnormality besides rate,  no significant change was noted Confirmed by Rolonda Pontarelli  MD, Jemiah Ellenburg ([id number]) on  05/25/2013 6:03:38 AM      MDM   Final diagnoses:  DKA (diabetic ketoacidoses)  Left arm pain  Left leg swelling    Patient Lowell BoutonLeavitt for diabetic ketoacidosis.  Question ongoing shortness of breath.  O2 sats are 92%.  Patient does report left leg swelling of left arm swelling as well as left arm pain.  Duplex ultrasound will be obtained to evaluate for DVT.  The patient has an elevated d-dimer although this difficult to evaluate in the setting of chronic renal failure.  The patient has a contrast media dye allergy and therefore she will undergo VQ scan.  I've given the patient a dose of Lovenox at this time.  Or shortness of breath could be due to her congestive heart failure.  This will be managed as an inpatient.  Started on insulin drip here.  Pain improved in her left arm with pain medication.  No infiltrate noted.  Patient will be admitted to the step down unit    Lyanne CoKevin M Elowyn Raupp, MD 05/25/13 [phone]

## 2013-05-25 NOTE — ED Notes (Signed)
Contact emergency Coralee Northennis Knight [phone]

## 2013-05-25 NOTE — ED Notes (Signed)
Vascular at bedside

## 2013-05-25 NOTE — H&P (Signed)
[facility] History and Physical  Jasmine [name]:[id number] DOB: 09/28/1970 DOA: 05/25/2013  Referring physician: Dr Patria Maneampos PCP: Dois DavenportICHTER,KAREN L., MD   Chief Complaint: left should er pain for2 days.   HPI: Jasmine [name] is a 43 y.o. female with prior h/o hypertension, chronic diastolic heart failure, sees Dr Jones BroomBensihmon as outpatient, OSA, DM type 1, asthma, obesity, stage 3 CKD, diabetic gastroparesis, non obstructive CAD, with h/o intubation for acute respiratory failure in February, comes in today for worsening left shoulder pain and left leg pain since 2 days. She also reports worsening sob over the last one week. She was seen at the office int he last one week and was given steroids. She reports her breathing hasnot improved, she also reports dry cough . She denies any other complaints. On arrival to the ED, she was found in diabetic ketoacidosis, hypoxic, hyponatremic, hyperkalemic. Currently she is on 4 lit [place] oxygen with sats in low 90's. Her CXR revealed mild interstitial edema. She was found to have elevated d dimer, and is awaiting V/Q SCAN for evaluation of PE. Her vascular studies of the left shoulder and and left LE negative for DVT. She is then referred to medical service for admission. She is currently on IV glucostabilizer for DKA.    Review of Systems:  Constitutional:  No weight loss, night sweats, Fevers, chills, fatigue.  HEENT:  No headaches, Difficulty swallowing,Tooth/dental problems,Sore throat,  No sneezing, itching, ear ache, nasal congestion, post nasal drip,  Cardio-vascular:  No chest pain,  Orthopnea present. No pedal edema. GI:  No heartburn, indigestion, abdominal pain, nausea, vomiting, diarrhea, change in bowel habits, loss of appetite  Resp:  Shortness of breath present. Dry cough present.  Skin:  no rash or lesions.  GU:  no dysuria, change in color of urine, no urgency or frequency. No flank pain.  Musculoskeletal:  Left shoulder pain .  Left leg pain.  Psych:  No change in mood or affect. No depression or anxiety. No memory loss.   Past Medical History  Diagnosis Date  . Diastolic heart failure     echo 11/03/10 EF 55-60%  . Hypertension   . Chronic kidney disease (CKD)     stage II-III  . Hyperlipidemia   . Uncontrolled diabetes mellitus   . Diabetic ketoacidosis     with seizures  . Anemia   . Diabetic gastroparesis   . Obesity   . Coronary artery disease     mild per cath 2010  . 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 UTI (lower urinary tract infection) 01/19/2012  . Pneumonia due to unspecified Streptococcus 10/16/2012  . Acute on chronic diastolic heart failure 01/18/2012  . Acute on chronic renal failure 01/18/2012  . Acute respiratory failure with hypoxia 10/08/2012   Past Surgical History  Procedure Laterality Date  . Cholecystectomy    . Carpal tunnel release  2003   Social History:  reports that she has never smoked. She has never used smokeless tobacco. She reports that she drinks alcohol. She reports that she does not use illicit drugs.  Allergies  Allergen Reactions  . Contrast Media [Iodinated Diagnostic Agents] Nausea And Vomiting  . Paroxetine Nausea And Vomiting  . Oxycodone Itching and Rash    Family History  Problem Relation Age of Onset  . Heart attack Mother   . Dementia Father      Prior to Admission medications   Medication Sig Start Date End Date Taking? Authorizing Provider  acetaminophen (TYLENOL) 500 MG tablet Take 500 mg by mouth every 6 (six) hours as needed for mild pain.   Yes Historical Provider, MD  albuterol (PROVENTIL HFA;VENTOLIN HFA) 108 (90 BASE) MCG/ACT inhaler Inhale 2 puffs into the lungs every 6 (six) hours as needed for wheezing. 10/25/12  Yes Amy D Clegg, NP  amLODipine (NORVASC) 10 MG tablet Take 10 mg by mouth daily.   Yes Historical Provider, MD  aspirin 81 MG tablet Take 81 mg by mouth daily as needed. For pain   Yes Historical Provider, MD   atorvastatin (LIPITOR) 40 MG tablet Take 1 tablet (40 mg total) by mouth daily at 6 PM. 11/20/12  Yes Dolores Pattyaniel R [name], MD  ferrous sulfate 325 (65 FE) MG tablet Take 325 mg by mouth 2 (two) times daily.     Yes Historical Provider, MD  furosemide (LASIX) 80 MG tablet Take 80 mg by mouth 2 (two) times daily.   Yes Historical Provider, MD  HYDROcodone-acetaminophen (NORCO) 5-325 MG per tablet Take 1 tablet by mouth every 6 (six) hours as needed for moderate pain. 04/13/13  Yes Penny Piarlando Vega, MD  insulin NPH-regular Human (NOVOLIN 70/30) (70-30) 100 UNIT/ML injection Inject 35-40 Units into the skin 2 (two) times daily with a meal. 40 in the morning and 35 in the evening   Yes Historical Provider, MD  Multiple Vitamin (MULTIVITAMIN WITH MINERALS) TABS tablet Take 1 tablet by mouth daily.   Yes Historical Provider, MD   Physical Exam: Filed Vitals:   05/25/13 0844  BP: 163/84  Pulse: 109  Temp: 98.3 F (36.8 C)  Resp: 30    BP 163/84  Pulse 109  Temp(Src) 98.3 F (36.8 C) (Oral)  Resp 30  Ht 5\' 7"  (1.702 m)  Wt 124.739 kg (275 lb)  BMI 43.06 kg/m2  SpO2 90%  General:  Appears  RESTLESS, but comfortable.  Eyes: PERRL, normal lids, irises & conjunctiva Neck: no LAD, masses or thyromegaly Cardiovascular: tachycardic, no pedal edema Respiratory: tachypnea, scattered rales.  Abdomen: soft, ntnd Musculoskeletal: grossly normal tone BUE/BLE Psychiatric: grossly normal mood and affect, speech fluent and appropriate Neurologic: grossly non-focal.          Labs on Admission:  Basic Metabolic Panel:  Recent Labs Lab 05/25/13 0545  NA 126*  K 5.4*  CL 91*  CO2 10*  GLUCOSE 701*  BUN 64*  CREATININE 2.02*  CALCIUM 8.8   Liver Function Tests:  Recent Labs Lab 05/25/13 0545  AST 12  ALT 14  ALKPHOS 105  BILITOT 0.4  PROT 8.1  ALBUMIN 3.7   No results found for this basename: LIPASE, AMYLASE,  in the last 168 hours No results found for this basename: AMMONIA,  in the  last 168 hours CBC:  Recent Labs Lab 05/25/13 0545  WBC 17.3*  HGB 9.9*  HCT 31.2*  MCV 90.2  PLT 342   Cardiac Enzymes: No results found for this basename: CKTOTAL, CKMB, CKMBINDEX, TROPONINI,  in the last 168 hours  BNP (last 3 results)  Recent Labs  11/01/12 1446 03/24/13 1200 05/25/13 0545  PROBNP 2441.0* 6479.0* 3258.0*   CBG:  Recent Labs Lab 05/25/13 0535 05/25/13 0830 05/25/13 0944  GLUCAP >600* >600* >600*    Radiological Exams on Admission: Dg Chest 2 View  05/25/2013   CLINICAL DATA:  Cough and severe shortness of breath. History of asthma.  EXAM: CHEST  2 VIEW  COMPARISON:  Chest radiograph from 04/04/2013  FINDINGS: The lungs are well-aerated. Vascular congestion  is noted. Mildly increased interstitial markings could reflect mild interstitial edema. Trace fluid is seen tracking along the major fissures. There is no evidence of pneumothorax.  The heart is borderline enlarged. Mild hilar prominence is nonspecific. No acute osseous abnormalities are seen.  IMPRESSION: Vascular congestion and borderline cardiomegaly; mildly increased interstitial markings could reflect mild interstitial edema.   Electronically Signed   By: Roanna Raider M.D.   On: 05/25/2013 06:31    EKG: sinus tachyat  127 with PVC'S borderline repolarization abnormality.   Assessment/Plan Active Problems:   DIABETES MELLITUS, TYPE I   DEPRESSION   Hypertension   OSA (obstructive sleep apnea)   Bilateral leg pain   Anemia, chronic disease   DKA (diabetic ketoacidoses)   Leukocytosis   Acute respiratory failure with hypoxia   Diabetic Ketoacidosis: Admit to step down. Started on IV glucostabilizer. Will transition her to subcutaneous insulin when cbg's are better controlled and AG Closes, her bicarb is 10. hgba1c is 9.6 on 03/23/2013. Suspect there is a history of non compliance to medications vs recent use of steroids. So far we could not find a source of infection. CXR does not reveal  any p neumonia. UA is pending.  Left Buttock ulcer stable.   Acute Respiratory Failure with Hypoxia: - probably from acute on chronic diastolic heart failure. She is currently not wheezing.  - [place] oxygen to keep sat's >90% - IV Lasix 60 mg q12hrs.  - bronchodilators every 6 hours as needed.   Hyponatremia; A combination of pseudohyponatremia from hyperglycemia and fluid overload.  Repeat level ordered.    Hyperkalemia; - repeat, on IV insulin.   CKD stage 3; - her creatinine is actually better than baseline.  - continue to monitor.   Leukocytosis: - unclear etiology.  - she is afebrile.  -? Reactive.   Anemia:  - appears to be at baseline.  - secondary to anemia of chronic disease.    Elevated D dimer:  - she is currently getting V/Q Scan to evaluate for DVT.   Left shoulder pain:  - probably musculoskeletal.  - left arm venous duplex neg for DVT.  - if the pain doesn 't improve will scan with CT.    DVT prophylaxis        Code Status: full code Family Communication: none at bedside Disposition Plan: admit to stepdown.   Time spent: 80 minutes  Kathlen Mody [facility] Pager [phone]

## 2013-05-25 NOTE — Progress Notes (Signed)
*  Preliminary Results* Left upper extremity venous duplex completed. Left upper extremity is negative for deep and superficial vein thrombosis.   Left lower extremity venous duplex completed. Left lower extremity is negative for deep vein thrombosis. There is no evidence of left Baker's cyst.  05/25/2013 9:34 AM  Gertie FeyMichelle Kawon Willcutt, RVT, RDCS, RDMS

## 2013-05-25 NOTE — ED Notes (Signed)
RT called to make aware of need for ABG.

## 2013-05-25 NOTE — Progress Notes (Signed)
Ur completed     

## 2013-05-25 NOTE — ED Notes (Signed)
Pt returned from VQ unable to lay flat for scan and very SHOB.   [facility], as pt requesting Pro air that she normally has at home.

## 2013-05-25 NOTE — ED Notes (Signed)
O2 sat 82% on room air  Oxygen applied at 2 liters/min via [place]

## 2013-05-25 NOTE — ED Notes (Signed)
Pt presents to the ed with c/o shortness of breath and states she is having difficulty breathing  Pt brought to Res A  Resp therapy paged  Visitor states pt was c/o left shoulder pain last night and took vicodin and naproxen for the pain  States she woke up this morning with difficulty breathing and continuing to c/o pain in her shoulder  Pt placed in bed and monitor applied  EKG obtained

## 2013-05-25 NOTE — ED Notes (Signed)
Patient's WOB diminished and pt does report feeling she can breathe easier at this time. No other complaints. Awaiting transfer.

## 2013-05-25 NOTE — Progress Notes (Signed)
Patient placed on CPAP 10 cmH20 for HS using FFM mask and 6L oxygen bleed in.  Patient is familiar with equipment and procedure, as patient wears CPAP at home.

## 2013-05-25 NOTE — ED Notes (Addendum)
VQ called and stated would be about an hour before her meds came and they could do her study.  THey asked for her to stay in ED and not go to floor until after test.

## 2013-05-25 NOTE — Progress Notes (Signed)
Dr. Blake DivineAkula notified pt having severe panic attack; ativan 2 mg iv given.  Respiratory therapy also contacted and pt was given a breathing treatment. RN continuing to monitor.

## 2013-05-25 NOTE — ED Notes (Signed)
Initial contact-pt offered breathing treatment. Breathing appears shallow but regular. Pt does not request breathing treatment at this time. No other complaints. Resting. Awaiting transfer. Insulin gtt confirmed to be running at 13.6 units/hour.

## 2013-05-26 ENCOUNTER — Inpatient Hospital Stay (HOSPITAL_COMMUNITY): Payer: 59

## 2013-05-26 DIAGNOSIS — I214 Non-ST elevation (NSTEMI) myocardial infarction: Secondary | ICD-10-CM

## 2013-05-26 DIAGNOSIS — I509 Heart failure, unspecified: Secondary | ICD-10-CM

## 2013-05-26 DIAGNOSIS — I10 Essential (primary) hypertension: Secondary | ICD-10-CM

## 2013-05-26 DIAGNOSIS — I5033 Acute on chronic diastolic (congestive) heart failure: Principal | ICD-10-CM

## 2013-05-26 LAB — BASIC METABOLIC PANEL
BUN: 72 mg/dL — ABNORMAL HIGH (ref 6–23)
BUN: 68 mg/dL — ABNORMAL HIGH (ref 6–23)
BUN: 66 mg/dL — ABNORMAL HIGH (ref 6–23)
BUN: 67 mg/dL — ABNORMAL HIGH (ref 6–23)
CALCIUM: 8.7 mg/dL (ref 8.4–10.5)
CALCIUM: 9.2 mg/dL (ref 8.4–10.5)
CHLORIDE: 102 meq/L (ref 96–112)
CHLORIDE: 104 meq/L (ref 96–112)
CHLORIDE: 107 meq/L (ref 96–112)
CO2: 17 mEq/L — ABNORMAL LOW (ref 19–32)
CO2: 16 mEq/L — ABNORMAL LOW (ref 19–32)
CO2: 17 mEq/L — ABNORMAL LOW (ref 19–32)
CO2: 14 mEq/L — ABNORMAL LOW (ref 19–32)
Calcium: 9 mg/dL (ref 8.4–10.5)
Calcium: 8.8 mg/dL (ref 8.4–10.5)
Chloride: 103 mEq/L (ref 96–112)
Creatinine, Ser: 2.12 mg/dL — ABNORMAL HIGH (ref 0.50–1.10)
Creatinine, Ser: 2.02 mg/dL — ABNORMAL HIGH (ref 0.50–1.10)
Creatinine, Ser: 2.09 mg/dL — ABNORMAL HIGH (ref 0.50–1.10)
Creatinine, Ser: 2.11 mg/dL — ABNORMAL HIGH (ref 0.50–1.10)
GFR calc Af Amer: 34 mL/min — ABNORMAL LOW (ref >90)
GFR calc non Af Amer: 29 mL/min — ABNORMAL LOW (ref >90)
GFR calc non Af Amer: 28 mL/min — ABNORMAL LOW (ref >90)
GFR, EST AFRICAN AMERICAN: 32 mL/min — AB (ref >90)
GFR, EST AFRICAN AMERICAN: 33 mL/min — AB (ref >90)
GFR, EST AFRICAN AMERICAN: 32 mL/min — AB (ref >90)
GFR, EST NON AFRICAN AMERICAN: 28 mL/min — AB (ref >90)
GFR, EST NON AFRICAN AMERICAN: 28 mL/min — AB (ref >90)
Glucose, Bld: 130 mg/dL — ABNORMAL HIGH (ref 70–99)
Glucose, Bld: 173 mg/dL — ABNORMAL HIGH (ref 70–99)
Glucose, Bld: 196 mg/dL — ABNORMAL HIGH (ref 70–99)
Glucose, Bld: 132 mg/dL — ABNORMAL HIGH (ref 70–99)
POTASSIUM: 4.6 meq/L (ref 3.7–5.3)
POTASSIUM: 4.6 meq/L (ref 3.7–5.3)
Potassium: 4.9 mEq/L (ref 3.7–5.3)
Potassium: 4.6 mEq/L (ref 3.7–5.3)
SODIUM: 136 meq/L — AB (ref 137–147)
Sodium: 137 mEq/L (ref 137–147)
Sodium: 137 mEq/L (ref 137–147)
Sodium: 140 mEq/L (ref 137–147)

## 2013-05-26 LAB — BLOOD GAS, ARTERIAL
Acid-base deficit: 8.3 mmol/L — ABNORMAL HIGH (ref 0.0–2.0)
Bicarbonate: 16.1 mEq/L — ABNORMAL LOW (ref 20.0–24.0)
DRAWN BY: 103701
FIO2: 1 %
O2 Saturation: 95.4 %
PO2 ART: 83.4 mmHg (ref 80.0–100.0)
Patient temperature: 99.2
TCO2: 15.2 mmol/L (ref 0–100)
pCO2 arterial: 31.1 mmHg — ABNORMAL LOW (ref 35.0–45.0)
pH, Arterial: 7.335 — ABNORMAL LOW (ref 7.350–7.450)

## 2013-05-26 LAB — CBC WITH DIFFERENTIAL/PLATELET
Basophils Absolute: 0 10*3/uL (ref 0.0–0.1)
Basophils Relative: 0 % (ref 0–1)
EOS ABS: 0.1 10*3/uL (ref 0.0–0.7)
Eosinophils Relative: 0 % (ref 0–5)
HCT: 27.8 % — ABNORMAL LOW (ref 36.0–46.0)
Hemoglobin: 9.2 g/dL — ABNORMAL LOW (ref 12.0–15.0)
LYMPHS ABS: 1.8 10*3/uL (ref 0.7–4.0)
LYMPHS PCT: 9 % — AB (ref 12–46)
MCH: 28.8 pg (ref 26.0–34.0)
MCHC: 33.1 g/dL (ref 30.0–36.0)
MCV: 87.1 fL (ref 78.0–100.0)
Monocytes Absolute: 1 10*3/uL (ref 0.1–1.0)
Monocytes Relative: 5 % (ref 3–12)
NEUTROS ABS: 17.7 10*3/uL — AB (ref 1.7–7.7)
Neutrophils Relative %: 86 % — ABNORMAL HIGH (ref 43–77)
PLATELETS: 276 10*3/uL (ref 150–400)
RBC: 3.19 MIL/uL — AB (ref 3.87–5.11)
RDW: 14.6 % (ref 11.5–15.5)
WBC: 20.5 10*3/uL — AB (ref 4.0–10.5)

## 2013-05-26 LAB — GLUCOSE, CAPILLARY
GLUCOSE-CAPILLARY: 137 mg/dL — AB (ref 70–99)
GLUCOSE-CAPILLARY: 138 mg/dL — AB (ref 70–99)
GLUCOSE-CAPILLARY: 171 mg/dL — AB (ref 70–99)
GLUCOSE-CAPILLARY: 111 mg/dL — AB (ref 70–99)
GLUCOSE-CAPILLARY: 141 mg/dL — AB (ref 70–99)
GLUCOSE-CAPILLARY: 165 mg/dL — AB (ref 70–99)
GLUCOSE-CAPILLARY: 210 mg/dL — AB (ref 70–99)
GLUCOSE-CAPILLARY: 143 mg/dL — AB (ref 70–99)
Glucose-Capillary: 108 mg/dL — ABNORMAL HIGH (ref 70–99)
Glucose-Capillary: 120 mg/dL — ABNORMAL HIGH (ref 70–99)
Glucose-Capillary: 128 mg/dL — ABNORMAL HIGH (ref 70–99)
Glucose-Capillary: 132 mg/dL — ABNORMAL HIGH (ref 70–99)
Glucose-Capillary: 138 mg/dL — ABNORMAL HIGH (ref 70–99)
Glucose-Capillary: 146 mg/dL — ABNORMAL HIGH (ref 70–99)
Glucose-Capillary: 150 mg/dL — ABNORMAL HIGH (ref 70–99)
Glucose-Capillary: 151 mg/dL — ABNORMAL HIGH (ref 70–99)
Glucose-Capillary: 156 mg/dL — ABNORMAL HIGH (ref 70–99)
Glucose-Capillary: 165 mg/dL — ABNORMAL HIGH (ref 70–99)
Glucose-Capillary: 170 mg/dL — ABNORMAL HIGH (ref 70–99)
Glucose-Capillary: 177 mg/dL — ABNORMAL HIGH (ref 70–99)
Glucose-Capillary: 189 mg/dL — ABNORMAL HIGH (ref 70–99)
Glucose-Capillary: 198 mg/dL — ABNORMAL HIGH (ref 70–99)
Glucose-Capillary: 216 mg/dL — ABNORMAL HIGH (ref 70–99)
Glucose-Capillary: 244 mg/dL — ABNORMAL HIGH (ref 70–99)

## 2013-05-26 LAB — URINE MICROSCOPIC-ADD ON

## 2013-05-26 LAB — TROPONIN I
Troponin I: 8.07 ng/mL (ref <0.30)
Troponin I: 7.47 ng/mL (ref <0.30)

## 2013-05-26 LAB — URINALYSIS, ROUTINE W REFLEX MICROSCOPIC
Bilirubin Urine: NEGATIVE
Glucose, UA: 500 mg/dL — AB
Hgb urine dipstick: NEGATIVE
Ketones, ur: NEGATIVE mg/dL
LEUKOCYTES UA: NEGATIVE
NITRITE: NEGATIVE
PH: 5 (ref 5.0–8.0)
Protein, ur: 30 mg/dL — AB
SPECIFIC GRAVITY, URINE: 1.013 (ref 1.005–1.030)
Urobilinogen, UA: 0.2 mg/dL (ref 0.0–1.0)

## 2013-05-26 LAB — PROCALCITONIN: PROCALCITONIN: 0.52 ng/mL

## 2013-05-26 LAB — APTT: APTT: 29 s (ref 24–37)

## 2013-05-26 MED ORDER — METOPROLOL TARTRATE 25 MG PO TABS
25.0000 mg | ORAL_TABLET | Freq: Two times a day (BID) | ORAL | Status: DC
  Administered 2013-05-26 – 2013-05-30 (×8): 25 mg via ORAL
  Filled 2013-05-26 (×10): qty 1

## 2013-05-26 MED ORDER — METOPROLOL TARTRATE 1 MG/ML IV SOLN
2.5000 mg | INTRAVENOUS | Status: AC
  Administered 2013-05-26: 2.5 mg via INTRAVENOUS

## 2013-05-26 MED ORDER — CLONIDINE HCL 0.1 MG PO TABS
0.1000 mg | ORAL_TABLET | ORAL | Status: DC | PRN
  Administered 2013-05-26 – 2013-05-28 (×3): 0.1 mg via ORAL
  Filled 2013-05-26 (×3): qty 1

## 2013-05-26 MED ORDER — FUROSEMIDE 10 MG/ML IJ SOLN
80.0000 mg | Freq: Three times a day (TID) | INTRAMUSCULAR | Status: DC
  Administered 2013-05-26 – 2013-05-30 (×11): 80 mg via INTRAVENOUS
  Filled 2013-05-26 (×18): qty 8

## 2013-05-26 MED ORDER — BIOTENE DRY MOUTH MT LIQD
15.0000 mL | Freq: Two times a day (BID) | OROMUCOSAL | Status: DC
  Administered 2013-05-27 – 2013-05-29 (×5): 15 mL via OROMUCOSAL

## 2013-05-26 MED ORDER — CHLORHEXIDINE GLUCONATE 0.12 % MT SOLN
15.0000 mL | Freq: Two times a day (BID) | OROMUCOSAL | Status: DC
  Administered 2013-05-27 – 2013-05-30 (×6): 15 mL via OROMUCOSAL
  Filled 2013-05-26 (×9): qty 15

## 2013-05-26 MED ORDER — HEPARIN (PORCINE) IN NACL 100-0.45 UNIT/ML-% IJ SOLN
1200.0000 [IU]/h | INTRAMUSCULAR | Status: DC
  Administered 2013-05-26 (×2): 1200 [IU]/h via INTRAVENOUS
  Filled 2013-05-26: qty 250

## 2013-05-26 MED ORDER — CLONIDINE HCL 0.1 MG PO TABS
0.1000 mg | ORAL_TABLET | Freq: Three times a day (TID) | ORAL | Status: DC
  Filled 2013-05-26 (×2): qty 1

## 2013-05-26 MED ORDER — HEPARIN BOLUS VIA INFUSION
2000.0000 [IU] | Freq: Once | INTRAVENOUS | Status: AC
  Administered 2013-05-26: 2000 [IU] via INTRAVENOUS
  Filled 2013-05-26: qty 2000

## 2013-05-26 MED ORDER — PANTOPRAZOLE SODIUM 40 MG PO TBEC
40.0000 mg | DELAYED_RELEASE_TABLET | Freq: Two times a day (BID) | ORAL | Status: DC
  Administered 2013-05-26 – 2013-05-30 (×8): 40 mg via ORAL
  Filled 2013-05-26 (×10): qty 1

## 2013-05-26 MED ORDER — LORAZEPAM 2 MG/ML IJ SOLN
1.0000 mg | Freq: Four times a day (QID) | INTRAMUSCULAR | Status: DC | PRN
  Administered 2013-05-26 – 2013-05-27 (×2): 2 mg via INTRAVENOUS
  Filled 2013-05-26: qty 1

## 2013-05-26 MED ORDER — METOPROLOL TARTRATE 25 MG PO TABS
25.0000 mg | ORAL_TABLET | Freq: Two times a day (BID) | ORAL | Status: DC
  Filled 2013-05-26: qty 1

## 2013-05-26 MED ORDER — ATORVASTATIN CALCIUM 80 MG PO TABS
80.0000 mg | ORAL_TABLET | Freq: Every day | ORAL | Status: DC
  Administered 2013-05-26 – 2013-05-29 (×4): 80 mg via ORAL
  Filled 2013-05-26 (×7): qty 1

## 2013-05-26 MED ORDER — METOPROLOL TARTRATE 1 MG/ML IV SOLN
INTRAVENOUS | Status: AC
  Filled 2013-05-26: qty 5

## 2013-05-26 MED ORDER — FUROSEMIDE 10 MG/ML IJ SOLN: 60.0000 mg | Freq: Three times a day (TID) | INTRAMUSCULAR | Status: DC

## 2013-05-26 MED ORDER — LORAZEPAM 2 MG/ML IJ SOLN
INTRAMUSCULAR | Status: AC
  Filled 2013-05-26: qty 1

## 2013-05-26 MED ORDER — CHLORHEXIDINE GLUCONATE CLOTH 2 % EX PADS
6.0000 | MEDICATED_PAD | Freq: Every day | CUTANEOUS | Status: AC
  Administered 2013-05-27 – 2013-05-28 (×2): 6 via TOPICAL

## 2013-05-26 MED ORDER — FUROSEMIDE 10 MG/ML IJ SOLN
80.0000 mg | Freq: Once | INTRAMUSCULAR | Status: AC
  Administered 2013-05-26: 80 mg via INTRAVENOUS
  Filled 2013-05-26: qty 8

## 2013-05-26 MED ORDER — HYDRALAZINE HCL 20 MG/ML IJ SOLN
5.0000 mg | Freq: Four times a day (QID) | INTRAMUSCULAR | Status: DC | PRN
  Filled 2013-05-26: qty 0.25

## 2013-05-26 NOTE — Progress Notes (Signed)
Name: Jasmine [name] MRN: [id number] DOB: 12-Aug-1970  ELECTRONIC ICU PHYSICIAN NOTE  Problem:  resp distress  Pt seen 10/30/12 with upper airway cough sydrome developed while on ACEi and flare p ET removed from admit for diastolic chf now readmitted with poorly controlled hbp and pulmonary edema on cxr, no longer on ACEi   Intake/Output Summary (Last 24 hours) at 05/26/13 1616 Last data filed at 05/26/13 0818  Gross per 24 hour  Intake 821.22 ml  Output   1350 ml  Net -528.78 ml     Lab Results  Component Value Date   PROBNP 3258.0* 05/25/2013     Intervention:  Diuresis/ clonidine acutely for bp control/ bipap prn     Nyoka CowdenMichael B Lynora Dymond 05/26/2013, 4:15 PM

## 2013-05-26 NOTE — Progress Notes (Signed)
[facility] PROGRESS NOTE  Jasmine [name] ZOX:[id number] DOB: 1970-12-31 DOA: 05/25/2013 PCP: Dois DavenportICHTER,KAREN L., MD  Assessment/Plan:  Acute respiratory failure with hypoxia: Secondary to acute on chronic diastolic heart failure.  Oxygen to keep sat's>90%. On cpap at night. She had respiratory distress earlier today and repeat CXR showed fulminant pulmonary edema. Ordered one dose of 80 MG IV lasix, followed by 60 mg every 8 hours. Bronchodilators PRN.  Repeat Echocardiogram ordered. Serial troponin's. Will call cardiology for further recommendations.  She is currently on NRB with sat's in 100%. Her recent ABG is 7.3, pCo2 is 31 and pO2 is 83 , bicarb is 16.     Diabetic Ketoacidosis:  Admitted to step down. Started on IV glucostabilizer. Will transition her to subcutaneous insulin when cbg's are better controlled and AG Closes, her bicarb is 16. hgba1c is 9.6 on 03/23/2013. Suspect there is a history of non compliance to medications vs recent use of steroids. So far we could not find a source of infection.   Hyponatremia: A combination of pseudohyponatremia from hyperglycemia and fluid overload.  Repeat level ordered.  Hyperkalemia;  Resolved.  - repeat, on IV insulin.   CKD stage 3;  - her creatinine is actually better than baseline.  - continue to monitor.   Leukocytosis:  - unclear etiology.  - she is afebrile.  -? Reactive.   Anemia:  - appears to be at baseline.  - secondary to anemia of chronic disease.   Elevated D dimer:  - V/Q scan negative or low prob for PE.   Left shoulder pain:  - probably musculoskeletal.  - left arm venous duplex neg for DVT.  - if the pain doesn 't improve will scan with CT.   DVT prophylaxis    Code Status: full code  Family Communication: none at bedside  Disposition Plan: remain in stepdown.          Consultants:  Pulmonary consult   Procedures:  NM SCAN- low probability of pulmonary embolus  CXR vascular  congestion.   Antibiotics:  none  HPI/Subjective: She reports that she had a panic attack, happened once on last admission too.  Report from RN that pt had 2 panic attacks,   Objective: Filed Vitals:   05/26/13 1200  BP:   Pulse:   Temp: 99.2 F (37.3 C)  Resp:     Intake/Output Summary (Last 24 hours) at 05/26/13 1447 Last data filed at 05/26/13 0818  Gross per 24 hour  Intake 921.22 ml  Output   1350 ml  Net -428.78 ml   Filed Weights   05/25/13 0637 05/25/13 1527  Weight: 124.739 kg (275 lb) 127 kg (279 lb 15.8 oz)    Exam:   General:  Alert , on CPAP this am, speaking softly,. comfortable  Cardiovascular: s1s2, tachycardic  Respiratory: scattered rales at bases, no wheezing heard.   Abdomen: soft NT ND BS+  Musculoskeletal: trace pedal edema.   Data Reviewed: Basic Metabolic Panel:  Recent Labs Lab 05/25/13 1050 05/25/13 1622 05/25/13 2236 05/26/13 0340 05/26/13 0950  NA 129* 132* 136* 136* 137  K 4.9 4.5 4.8 4.6 4.9  CL 93* 99 101 102 104  CO2 11* 15* 13* 17* 16*  GLUCOSE 598* 139* 230* 130* 173*  BUN 67* 71* 71* 72* 68*  CREATININE 2.09* 2.28* 2.15* 2.12* 2.02*  CALCIUM 8.7 9.1 8.9 9.0 8.7   Liver Function Tests:  Recent Labs Lab 05/25/13 0545  AST 12  ALT 14  ALKPHOS 105  BILITOT 0.4  PROT 8.1  ALBUMIN 3.7   No results found for this basename: LIPASE, AMYLASE,  in the last 168 hours No results found for this basename: AMMONIA,  in the last 168 hours CBC:  Recent Labs Lab 05/25/13 0545 05/26/13 0340  WBC 17.3* 20.5*  NEUTROABS  --  17.7*  HGB 9.9* 9.2*  HCT 31.2* 27.8*  MCV 90.2 87.1  PLT 342 276   Cardiac Enzymes: No results found for this basename: CKTOTAL, CKMB, CKMBINDEX, TROPONINI,  in the last 168 hours BNP (last 3 results)  Recent Labs  11/01/12 1446 03/24/13 1200 05/25/13 0545  PROBNP 2441.0* 6479.0* 3258.0*   CBG:  Recent Labs Lab 05/26/13 0711 05/26/13 0808 05/26/13 0923 05/26/13 1037  05/26/13 1142  GLUCAP 141* 156* 165* 170* 165*    Recent Results (from the past 240 hour(s))  MRSA PCR SCREENING     Status: Abnormal   Collection Time    05/25/13  3:34 PM      Result Value Ref Range Status   MRSA by PCR POSITIVE (*) NEGATIVE Final   Comment:            The GeneXpert MRSA Assay (FDA     approved for NASAL specimens     only), is one component of a     comprehensive MRSA colonization     surveillance program. It is not     intended to diagnose MRSA     infection nor to guide or     monitor treatment for     MRSA infections.     RESULT CALLED TO, READ BACK BY AND VERIFIED WITH:     A.MABRY RN AT 1732 ON [id number] BY C.BONGEL     Studies: Dg Chest 2 View  05/25/2013   CLINICAL DATA:  Cough and severe shortness of breath. History of asthma.  EXAM: CHEST  2 VIEW  COMPARISON:  Chest radiograph from 04/04/2013  FINDINGS: The lungs are well-aerated. Vascular congestion is noted. Mildly increased interstitial markings could reflect mild interstitial edema. Trace fluid is seen tracking along the major fissures. There is no evidence of pneumothorax.  The heart is borderline enlarged. Mild hilar prominence is nonspecific. No acute osseous abnormalities are seen.  IMPRESSION: Vascular congestion and borderline cardiomegaly; mildly increased interstitial markings could reflect mild interstitial edema.   Electronically Signed   By: Roanna Raider M.D.   On: 05/25/2013 06:31   Nm Pulmonary Perf And Vent  05/25/2013   CLINICAL DATA:  Shortness of breath and elevated D-dimer.  EXAM: NUCLEAR MEDICINE VENTILATION - PERFUSION LUNG SCAN  TECHNIQUE: Ventilation images were obtained in multiple projections using inhaled aerosol technetium 99 M DTPA. Perfusion images were obtained in multiple projections after intravenous injection of Tc-61m MAA.  RADIOPHARMACEUTICALS:  38 mCi Tc-80m DTPA aerosol and 5 mCi Tc-18m MAA  COMPARISON:  Chest radiograph 05/25/2013  FINDINGS: Ventilation: Pooling of  tracer activity demonstrated in the esophagus. Limited activity demonstrated in the lungs but visualized activity is homogeneous and symmetrical.  Perfusion: Normal homogeneous distribution of tracer activity throughout both lungs. No focal perfusion defects demonstrated.  IMPRESSION: Low probability of pulmonary embolus.   Electronically Signed   By: Burman Nieves M.D.   On: 05/25/2013 22:05    Scheduled Meds: . amLODipine  10 mg Oral Daily  . Chlorhexidine Gluconate Cloth  6 each Topical Q0600  . enoxaparin (LOVENOX) injection  60 mg Subcutaneous Q24H  . furosemide  60 mg Intravenous 3 times per day  .  mupirocin ointment  1 application Nasal BID   Continuous Infusions: . dextrose 5 % and 0.45% NaCl 50 mL/hr at 05/25/13 1314  . insulin (NOVOLIN-R) infusion 2.6 Units/hr (05/26/13 1426)    Active Problems:   DIABETES MELLITUS, TYPE I   DEPRESSION   Hypertension   OSA (obstructive sleep apnea)   Bilateral leg pain   Anemia, chronic disease   DKA (diabetic ketoacidoses)   Leukocytosis   Acute respiratory failure with hypoxia    Time spent: 45 minutes    Kathlen ModyVijaya Deseri Loss  [facility] Pager [phone] If 7PM-7AM, please contact night-coverage at [email or web address], password [email or web address] 05/26/2013, 2:47 PM  LOS: 1 day

## 2013-05-26 NOTE — Progress Notes (Signed)
CRITICAL VALUE ALERT  8.07  Date of notification:  05/26/13  Time of notification:  1805  Critical value read back: yes  Nurse who received alert:  Judith BlonderAnita Asriel Westrup,RN  MD notified (1st page):  Blake DivineAkula  Time of first page:  1605  MD notified (2nd page): no 2nd page  Time of second page:no 2nd page  Responding MD:  Blake DivineAkula  Time MD responded:  [phone]

## 2013-05-26 NOTE — Progress Notes (Signed)
Pt up to bedside commode and noted to be very dyspneic with activity. On call notified. Order to place foley. Foley placed, patient tolerated well. Gifford ShaveSara K Jawaun Celmer, RN

## 2013-05-26 NOTE — Progress Notes (Signed)
ANTICOAGULATION CONSULT NOTE - Initial Consult  Pharmacy Consult for Heparin Indication: chest pain/ACS  Allergies  Allergen Reactions  . Contrast Media [Iodinated Diagnostic Agents] Nausea And Vomiting  . Paroxetine Nausea And Vomiting  . Oxycodone Itching and Rash    Patient Measurements: Height: 5\' 7"  (170.2 cm) Weight: 279 lb 15.8 oz (127 kg) IBW/kg (Calculated) : 61.6 Heparin Dosing Weight: 92kg  Vital Signs: Temp: 99.2 F (37.3 C) (05/02 1200) Temp src: Oral (05/02 1200) BP: 176/75 mmHg (05/02 1100) Pulse Rate: 111 (05/02 1100)  Labs:  Recent Labs  05/25/13 0545  05/26/13 0340 05/26/13 0950 05/26/13 1511  HGB 9.9*  --  9.2*  --   --   HCT 31.2*  --  27.8*  --   --   PLT 342  --  276  --   --   CREATININE 2.02*  < > 2.12* 2.02* 2.09*  TROPONINI  --   --   --   --  8.07*  < > = values in this interval not displayed.  Estimated Creatinine Clearance: 48.6 ml/min (by C-G formula based on Cr of 2.09).   Medical History: Past Medical History  Diagnosis Date  . Diastolic heart failure     echo 11/03/10 EF 55-60%  . Hypertension   . Chronic kidney disease (CKD)     stage II-III  . Hyperlipidemia   . Uncontrolled diabetes mellitus   . Diabetic ketoacidosis     with seizures  . Anemia   . Diabetic gastroparesis   . Obesity   . Coronary artery disease     mild per cath 2010  . 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 UTI (lower urinary tract infection) 01/19/2012  . Pneumonia due to unspecified Streptococcus 10/16/2012  . Acute on chronic diastolic heart failure 01/18/2012  . Acute on chronic renal failure 01/18/2012  . Acute respiratory failure with hypoxia 10/08/2012    Medications:  Prescriptions prior to admission  Medication Sig Dispense Refill  . acetaminophen (TYLENOL) 500 MG tablet Take 500 mg by mouth every 6 (six) hours as needed for mild pain.      Marland Kitchen. albuterol (PROVENTIL HFA;VENTOLIN HFA) 108 (90 BASE) MCG/ACT inhaler Inhale 2  puffs into the lungs every 6 (six) hours as needed for wheezing.  1 Inhaler  2  . amLODipine (NORVASC) 10 MG tablet Take 10 mg by mouth daily.      Marland Kitchen. aspirin 81 MG tablet Take 81 mg by mouth daily as needed. For pain      . atorvastatin (LIPITOR) 40 MG tablet Take 1 tablet (40 mg total) by mouth daily at 6 PM.  90 tablet  3  . ferrous sulfate 325 (65 FE) MG tablet Take 325 mg by mouth 2 (two) times daily.        . furosemide (LASIX) 80 MG tablet Take 80 mg by mouth 2 (two) times daily.      Marland Kitchen. HYDROcodone-acetaminophen (NORCO) 5-325 MG per tablet Take 1 tablet by mouth every 6 (six) hours as needed for moderate pain.  30 tablet  0  . insulin NPH-regular Human (NOVOLIN 70/30) (70-30) 100 UNIT/ML injection Inject 35-40 Units into the skin 2 (two) times daily with a meal. 40 in the morning and 35 in the evening      . Multiple Vitamin (MULTIVITAMIN WITH MINERALS) TABS tablet Take 1 tablet by mouth daily.       Assessment: 43yo F admitted with acute respiratory failure, pulmonary edema, DKA, L  arm pain. Pharmacy is asked to switch from prophylactic Lovenox to IV heparin for suspected NSTEMI.  Last Lovenox was 60mg  at 10:49.  Goal of Therapy:  Heparin level 0.3-0.7 units/ml Monitor platelets by anticoagulation protocol: Yes   Plan:   Draw baseline aPTT and PT/INR.  DC Lovenox.  Give heparin 2000 units IV bolus then start infusion at 1200 units/hr.  Check heparin level in 8hrs and daily therafter.  Check CBC q48h while on heparin.  F/u daily.  Charolotte Ekeom Shaheim Mahar, PharmD, pager [phone]. 05/26/2013,4:24 PM.

## 2013-05-26 NOTE — Consult Note (Addendum)
Admit date: 05/25/2013 Referring Physician  Dr. Blake Divine Primary Physician  Dr. Hal Hope Primary Cardiologist  Dr. Gala Romney Reason for Consultation  NSTEMI/CHF  HPI: Jasmine [name] is a 43 y.o. female with prior h/o hypertension, chronic diastolic heart failure, sees Dr Gala Romney as outpatient, OSA, DM type 1, asthma, obesity, stage 3 CKD, diabetic gastroparesis, non obstructive CAD, with h/o intubation for acute respiratory failure in February, comes in today for worsening left shoulder pain and left leg pain since 2 days. She also reports worsening sob over the last one week. She was seen at the office in the last week and was given steroids. She reports her breathing has not improved, she also reports dry cough . She denies any other complaints. On arrival to the ED, she was found in diabetic ketoacidosis, hypoxic, hyponatremic, hyperkalemic. Currently she is on 4 lit Rudy oxygen with sats in low 90's. Her CXR revealed mild interstitial edema. She was found to have elevated d dimer, and is awaiting V/Q SCAN for evaluation of PE. Her vascular studies of the left shoulder and and left LE negative for DVT. She is then referred to medical service for admission. Over the past 24 hours she has had worsening SOB requiring NRB and chest xray now shows fulminant pulmonary edema.  She was given IV Lasix 80mg  earlier this afternoon.  A d-dimer was elevated and she underwent VQ scan which was low prob for PE.  Troponin was noted to be elevated at 8 and Cardiology was asked to see in consultation.       PMH:   Past Medical History  Diagnosis Date  . Diastolic heart failure     echo 11/03/10 EF 55-60%  . Hypertension   . Chronic kidney disease (CKD)     stage II-III  . Hyperlipidemia   . Uncontrolled diabetes mellitus   . Diabetic ketoacidosis     with seizures  . Anemia   . Diabetic gastroparesis   . Obesity   . Coronary artery disease     mild per cath 2010  . CKD (chronic kidney disease), stage IV  01/20/2012  . UTI (lower urinary tract infection) 01/19/2012  . Pneumonia due to unspecified Streptococcus 10/16/2012  . Acute on chronic diastolic heart failure 01/18/2012  . Acute on chronic renal failure 01/18/2012  . Acute respiratory failure with hypoxia 10/08/2012     PSH:   Past Surgical History  Procedure Laterality Date  . Cholecystectomy    . Carpal tunnel release  2003    Allergies:  Contrast media; Paroxetine; and Oxycodone Prior to Admit Meds:   Prescriptions prior to admission  Medication Sig Dispense Refill  . acetaminophen (TYLENOL) 500 MG tablet Take 500 mg by mouth every 6 (six) hours as needed for mild pain.      Marland Kitchen albuterol (PROVENTIL HFA;VENTOLIN HFA) 108 (90 BASE) MCG/ACT inhaler Inhale 2 puffs into the lungs every 6 (six) hours as needed for wheezing.  1 Inhaler  2  . amLODipine (NORVASC) 10 MG tablet Take 10 mg by mouth daily.      Marland Kitchen aspirin 81 MG tablet Take 81 mg by mouth daily as needed. For pain      . atorvastatin (LIPITOR) 40 MG tablet Take 1 tablet (40 mg total) by mouth daily at 6 PM.  90 tablet  3  . ferrous sulfate 325 (65 FE) MG tablet Take 325 mg by mouth 2 (two) times daily.        . furosemide (LASIX) 80 MG  tablet Take 80 mg by mouth 2 (two) times daily.      Marland Kitchen HYDROcodone-acetaminophen (NORCO) 5-325 MG per tablet Take 1 tablet by mouth every 6 (six) hours as needed for moderate pain.  30 tablet  0  . insulin NPH-regular Human (NOVOLIN 70/30) (70-30) 100 UNIT/ML injection Inject 35-40 Units into the skin 2 (two) times daily with a meal. 40 in the morning and 35 in the evening      . Multiple Vitamin (MULTIVITAMIN WITH MINERALS) TABS tablet Take 1 tablet by mouth daily.       Fam HX:    Family History  Problem Relation Age of Onset  . Heart attack Mother   . Dementia Father    Social HX:    History   Social History  . Marital Status: Single    Spouse Name: N/A    Number of Children: N/A  . Years of Education: N/A   Occupational History  .  Not on file.   Social History Main Topics  . Smoking status: Never Smoker   . Smokeless tobacco: Never Used  . Alcohol Use: Yes     Comment: occasionally  . Drug Use: No  . Sexual Activity: Yes    Birth Control/ Protection: IUD   Other Topics Concern  . Not on file   Social History Narrative   She lives with her boyfriend in [place].       ROS:  All 11 ROS were addressed and are negative except what is stated in the HPI  Physical Exam: Blood pressure 176/75, pulse 111, temperature 99.2 F (37.3 C), temperature source Oral, resp. rate 24, height 5\' 7"  (1.702 m), weight 279 lb 15.8 oz (127 kg), SpO2 97.00%.    General: Well developed, well nourished, in no acute distress Head: Eyes PERRLA, No xanthomas.   Normal cephalic and atramatic  Lungs:   Crackles at bases bilaterally Heart:   HRRR tachy S1 S2 Pulses are 2+ & equal. Abdomen: Bowel sounds are positive, abdomen soft and non-tender without masses  Extremities:   No clubbing, cyanosis or edema.  DP +1 Neuro: Alert and oriented X 3. Psych:  Good affect, responds appropriately    Labs:   Lab Results  Component Value Date   WBC 20.5* 05/26/2013   HGB 9.2* 05/26/2013   HCT 27.8* 05/26/2013   MCV 87.1 05/26/2013   PLT 276 05/26/2013    Recent Labs Lab 05/25/13 0545  05/26/13 1511  NA 126*  < > 137  K 5.4*  < > 4.6  CL 91*  < > 103  CO2 10*  < > 17*  BUN 64*  < > 66*  CREATININE 2.02*  < > 2.09*  CALCIUM 8.8  < > 8.8  PROT 8.1  --   --   BILITOT 0.4  --   --   ALKPHOS 105  --   --   ALT 14  --   --   AST 12  --   --   GLUCOSE 701*  < > 196*  < > = values in this interval not displayed. No results found for this basename: PTT   Lab Results  Component Value Date   INR 1.22 03/23/2013   INR 1.22 08/06/2009   INR 1.0 05/02/2008   Lab Results  Component Value Date   CKTOTAL 158 11/03/2010   CKMB 2.7 11/03/2010   TROPONINI 8.07* 05/26/2013     Lab Results  Component Value Date   CHOL 314*  11/03/2010   CHOL  Value:  280        ATP III CLASSIFICATION:  <200     mg/dL   Desirable  161-096200-239  mg/dL   Borderline High  >=045>=240    mg/dL   High       * 4/09/81197/13/2011   CHOL  Value: 264        ATP III CLASSIFICATION:  <200     mg/dL   Desirable  147-829200-239  mg/dL   Borderline High  >=562>=240    mg/dL   High       * 1/30/86574/13/2011   Lab Results  Component Value Date   HDL 35* 11/03/2010   HDL 33* 08/06/2009   HDL 27* 05/07/2009   Lab Results  Component Value Date   LDLCALC 247* 11/03/2010   LDLCALC  Value: 218        Total Cholesterol/HDL:CHD Risk Coronary Heart Disease Risk Table                     Men   Women  1/2 Average Risk   3.4   3.3  Average Risk       5.0   4.4  2 X Average Risk   9.6   7.1  3 X Average Risk  23.4   11.0        Use the calculated Patient Ratio above and the CHD Risk Table to determine the patient's CHD Risk.        ATP III CLASSIFICATION (LDL):  <100     mg/dL   Optimal  846-962100-129  mg/dL   Near or Above                    Optimal  130-159  mg/dL   Borderline  952-841160-189  mg/dL   High  >324>190     mg/dL   Very High* 4/01/02727/13/2011   LDLCALC  Value: 187        Total Cholesterol/HDL:CHD Risk Coronary Heart Disease Risk Table                     Men   Women  1/2 Average Risk   3.4   3.3  Average Risk       5.0   4.4  2 X Average Risk   9.6   7.1  3 X Average Risk  23.4   11.0        Use the calculated Patient Ratio above and the CHD Risk Table to determine the patient's CHD Risk.        ATP III CLASSIFICATION (LDL):  <100     mg/dL   Optimal  536-644100-129  mg/dL   Near or Above                    Optimal  130-159  mg/dL   Borderline  034-742160-189  mg/dL   High  >595>190     mg/dL   Very High* 6/38/75644/13/2011   Lab Results  Component Value Date   TRIG 159* 11/03/2010   TRIG 145 08/06/2009   TRIG 250* 05/07/2009   Lab Results  Component Value Date   CHOLHDL 9.0 11/03/2010   CHOLHDL 8.5 08/06/2009   CHOLHDL 9.8 05/07/2009   No results found for this basename: LDLDIRECT      Radiology:  Dg Chest 2 View  05/25/2013   CLINICAL DATA:  Cough and severe  shortness of breath. History of  asthma.  EXAM: CHEST  2 VIEW  COMPARISON:  Chest radiograph from 04/04/2013  FINDINGS: The lungs are well-aerated. Vascular congestion is noted. Mildly increased interstitial markings could reflect mild interstitial edema. Trace fluid is seen tracking along the major fissures. There is no evidence of pneumothorax.  The heart is borderline enlarged. Mild hilar prominence is nonspecific. No acute osseous abnormalities are seen.  IMPRESSION: Vascular congestion and borderline cardiomegaly; mildly increased interstitial markings could reflect mild interstitial edema.   Electronically Signed   By: Roanna Raider M.D.   On: 05/25/2013 06:31   Nm Pulmonary Perf And Vent  05/25/2013   CLINICAL DATA:  Shortness of breath and elevated D-dimer.  EXAM: NUCLEAR MEDICINE VENTILATION - PERFUSION LUNG SCAN  TECHNIQUE: Ventilation images were obtained in multiple projections using inhaled aerosol technetium 99 M DTPA. Perfusion images were obtained in multiple projections after intravenous injection of Tc-18m MAA.  RADIOPHARMACEUTICALS:  38 mCi Tc-48m DTPA aerosol and 5 mCi Tc-67m MAA  COMPARISON:  Chest radiograph 05/25/2013  FINDINGS: Ventilation: Pooling of tracer activity demonstrated in the esophagus. Limited activity demonstrated in the lungs but visualized activity is homogeneous and symmetrical.  Perfusion: Normal homogeneous distribution of tracer activity throughout both lungs. No focal perfusion defects demonstrated.  IMPRESSION: Low probability of pulmonary embolus.   Electronically Signed   By: Burman Nieves M.D.   On: 05/25/2013 22:05   Dg Chest Port 1 View  05/26/2013   CLINICAL DATA:  Shortness of breath.  EXAM: PORTABLE CHEST - 1 VIEW  COMPARISON:  05/25/2013.  FINDINGS: The heart is enlarged. There is a fulminant pattern of pulmonary edema. No definite pleural effusions.  IMPRESSION: Interval development of fulminant pulmonary edema.   Electronically Signed   By: Loralie Champagne M.D.   On: 05/26/2013 14:47    EKG:  NSR with anteroseptal infarct and nonspecific ST abnormality  ASSESSMENT:  1.  Acute on chronic diastolic CHF with fulminant CHF on chest xray.  This may be exacerbated by poorly controlled HTN.  Last echo 09/2012 showed overall normal LVF EF 55-60% with mild HK of the apical septum and apical myocardium.  She has put out 400cc with Lasix just given and feels better.  2.  NSTEMI - unclear whether this is an acute thrombotic event or type II demand ischemia.  Her Troponin higher that would be expected for demand ischemia but she has underlying CKD and also acute CHF.  She had nonobstructive ASCAD on cath in 2010 which may have progressed since cath given her poorly controlled DM and dyslipidemia.  She denies any chest pain or pressure.  EKG is nonischemic 3.  HTN with elevated BP 4.  DKA and remains acidotic on insulin gtt - patient had 2 steroid shots recently for ?vocal cord problem which shot her BS up.   5.  CKD which appears at baseline 6.  Dyslipidemia with poorly treated LDL 7.  Sinus tachcyardia   PLAN:   1.  Agree with continuing IV Lasix and increase to 80mg  IV TID and follow renal function closely 2.  Continue to cycle cardiac enzymes until they peak 3.  Continue IV Heparin and start baby ASA 4.  Continue amlodipine/clonidine 5.  Start Lopressor 25mg  BID for HR and BP control and titrated as needed to get control of tachycardia and HTN.  Will give a dose of IV Lopressor 2.5mg  now due to BP of 138/179mmHg 6.  Restart Atorvastatin and increase to 80mg  daily  Quintella Reichert, MD  05/26/2013  4:49 PM

## 2013-05-27 ENCOUNTER — Inpatient Hospital Stay (HOSPITAL_COMMUNITY): Payer: 59

## 2013-05-27 DIAGNOSIS — E1110 Type 2 diabetes mellitus with ketoacidosis without coma: Secondary | ICD-10-CM

## 2013-05-27 DIAGNOSIS — I517 Cardiomegaly: Secondary | ICD-10-CM

## 2013-05-27 DIAGNOSIS — E109 Type 1 diabetes mellitus without complications: Secondary | ICD-10-CM

## 2013-05-27 DIAGNOSIS — N189 Chronic kidney disease, unspecified: Secondary | ICD-10-CM

## 2013-05-27 DIAGNOSIS — F411 Generalized anxiety disorder: Secondary | ICD-10-CM

## 2013-05-27 DIAGNOSIS — N179 Acute kidney failure, unspecified: Secondary | ICD-10-CM

## 2013-05-27 DIAGNOSIS — I5032 Chronic diastolic (congestive) heart failure: Secondary | ICD-10-CM

## 2013-05-27 DIAGNOSIS — G4733 Obstructive sleep apnea (adult) (pediatric): Secondary | ICD-10-CM

## 2013-05-27 DIAGNOSIS — J9600 Acute respiratory failure, unspecified whether with hypoxia or hypercapnia: Secondary | ICD-10-CM

## 2013-05-27 LAB — CBC WITH DIFFERENTIAL/PLATELET
BASOS ABS: 0 10*3/uL (ref 0.0–0.1)
BASOS PCT: 0 % (ref 0–1)
Eosinophils Absolute: 0.2 10*3/uL (ref 0.0–0.7)
Eosinophils Relative: 1 % (ref 0–5)
HEMATOCRIT: 25.1 % — AB (ref 36.0–46.0)
Hemoglobin: 8.3 g/dL — ABNORMAL LOW (ref 12.0–15.0)
Lymphocytes Relative: 14 % (ref 12–46)
Lymphs Abs: 1.8 10*3/uL (ref 0.7–4.0)
MCH: 28.9 pg (ref 26.0–34.0)
MCHC: 33.1 g/dL (ref 30.0–36.0)
MCV: 87.5 fL (ref 78.0–100.0)
MONO ABS: 1 10*3/uL (ref 0.1–1.0)
Monocytes Relative: 8 % (ref 3–12)
NEUTROS PCT: 77 % (ref 43–77)
Neutro Abs: 10.2 10*3/uL — ABNORMAL HIGH (ref 1.7–7.7)
Platelets: 267 10*3/uL (ref 150–400)
RBC: 2.87 MIL/uL — ABNORMAL LOW (ref 3.87–5.11)
RDW: 14.8 % (ref 11.5–15.5)
WBC: 13.1 10*3/uL — ABNORMAL HIGH (ref 4.0–10.5)

## 2013-05-27 LAB — BASIC METABOLIC PANEL
BUN: 63 mg/dL — ABNORMAL HIGH (ref 6–23)
BUN: 62 mg/dL — ABNORMAL HIGH (ref 6–23)
CHLORIDE: 106 meq/L (ref 96–112)
CHLORIDE: 104 meq/L (ref 96–112)
CO2: 17 mEq/L — ABNORMAL LOW (ref 19–32)
CO2: 17 meq/L — AB (ref 19–32)
Calcium: 8.7 mg/dL (ref 8.4–10.5)
Calcium: 8.9 mg/dL (ref 8.4–10.5)
Creatinine, Ser: 2.14 mg/dL — ABNORMAL HIGH (ref 0.50–1.10)
Creatinine, Ser: 2.1 mg/dL — ABNORMAL HIGH (ref 0.50–1.10)
GFR calc Af Amer: 32 mL/min — ABNORMAL LOW (ref >90)
GFR calc non Af Amer: 28 mL/min — ABNORMAL LOW (ref >90)
GFR, EST AFRICAN AMERICAN: 32 mL/min — AB (ref >90)
GFR, EST NON AFRICAN AMERICAN: 27 mL/min — AB (ref >90)
GLUCOSE: 152 mg/dL — AB (ref 70–99)
Glucose, Bld: 188 mg/dL — ABNORMAL HIGH (ref 70–99)
POTASSIUM: 4.3 meq/L (ref 3.7–5.3)
POTASSIUM: 4.4 meq/L (ref 3.7–5.3)
SODIUM: 138 meq/L (ref 137–147)
SODIUM: 137 meq/L (ref 137–147)

## 2013-05-27 LAB — GLUCOSE, CAPILLARY
GLUCOSE-CAPILLARY: 102 mg/dL — AB (ref 70–99)
GLUCOSE-CAPILLARY: 133 mg/dL — AB (ref 70–99)
GLUCOSE-CAPILLARY: 163 mg/dL — AB (ref 70–99)
GLUCOSE-CAPILLARY: 169 mg/dL — AB (ref 70–99)
GLUCOSE-CAPILLARY: 204 mg/dL — AB (ref 70–99)
GLUCOSE-CAPILLARY: 62 mg/dL — AB (ref 70–99)
Glucose-Capillary: 140 mg/dL — ABNORMAL HIGH (ref 70–99)
Glucose-Capillary: 185 mg/dL — ABNORMAL HIGH (ref 70–99)
Glucose-Capillary: 188 mg/dL — ABNORMAL HIGH (ref 70–99)
Glucose-Capillary: 193 mg/dL — ABNORMAL HIGH (ref 70–99)
Glucose-Capillary: 104 mg/dL — ABNORMAL HIGH (ref 70–99)
Glucose-Capillary: 115 mg/dL — ABNORMAL HIGH (ref 70–99)
Glucose-Capillary: 118 mg/dL — ABNORMAL HIGH (ref 70–99)
Glucose-Capillary: 124 mg/dL — ABNORMAL HIGH (ref 70–99)
Glucose-Capillary: 139 mg/dL — ABNORMAL HIGH (ref 70–99)
Glucose-Capillary: 188 mg/dL — ABNORMAL HIGH (ref 70–99)

## 2013-05-27 LAB — TROPONIN I: TROPONIN I: 6.28 ng/mL — AB (ref <0.30)

## 2013-05-27 LAB — HEPARIN LEVEL (UNFRACTIONATED)
HEPARIN UNFRACTIONATED: 0.3 [IU]/mL (ref 0.30–0.70)
HEPARIN UNFRACTIONATED: 0.23 [IU]/mL — AB (ref 0.30–0.70)
Heparin Unfractionated: 0.17 IU/mL — ABNORMAL LOW (ref 0.30–0.70)

## 2013-05-27 LAB — PROTIME-INR
INR: 1.16 (ref 0.00–1.49)
Prothrombin Time: 14.6 seconds (ref 11.6–15.2)

## 2013-05-27 MED ORDER — INSULIN ASPART 100 UNIT/ML ~~LOC~~ SOLN
0.0000 [IU] | SUBCUTANEOUS | Status: DC
  Administered 2013-05-27: 2 [IU] via SUBCUTANEOUS

## 2013-05-27 MED ORDER — CLONAZEPAM 0.5 MG PO TABS
0.5000 mg | ORAL_TABLET | Freq: Two times a day (BID) | ORAL | Status: DC
  Administered 2013-05-27 – 2013-05-30 (×7): 0.5 mg via ORAL
  Filled 2013-05-27 (×7): qty 1

## 2013-05-27 MED ORDER — HEPARIN (PORCINE) IN NACL 100-0.45 UNIT/ML-% IJ SOLN
1400.0000 [IU]/h | INTRAMUSCULAR | Status: DC
  Filled 2013-05-27 (×2): qty 250

## 2013-05-27 MED ORDER — NITROGLYCERIN 2 % TD OINT
0.5000 [in_us] | TOPICAL_OINTMENT | Freq: Three times a day (TID) | TRANSDERMAL | Status: DC
  Administered 2013-05-27 – 2013-05-29 (×6): 0.5 [in_us] via TOPICAL
  Filled 2013-05-27: qty 30

## 2013-05-27 MED ORDER — INSULIN ASPART 100 UNIT/ML ~~LOC~~ SOLN
0.0000 [IU] | SUBCUTANEOUS | Status: DC
  Administered 2013-05-28 (×2): 3 [IU] via SUBCUTANEOUS

## 2013-05-27 MED ORDER — ESCITALOPRAM OXALATE 10 MG PO TABS
10.0000 mg | ORAL_TABLET | Freq: Every day | ORAL | Status: DC
  Administered 2013-05-27 – 2013-05-29 (×3): 10 mg via ORAL
  Filled 2013-05-27 (×5): qty 1

## 2013-05-27 MED ORDER — HEPARIN (PORCINE) IN NACL 100-0.45 UNIT/ML-% IJ SOLN
1800.0000 [IU]/h | INTRAMUSCULAR | Status: DC
  Administered 2013-05-27: 1550 [IU]/h via INTRAVENOUS
  Administered 2013-05-27 – 2013-05-28 (×2): 1800 [IU]/h via INTRAVENOUS
  Filled 2013-05-27 (×2): qty 250

## 2013-05-27 MED ORDER — HEPARIN BOLUS VIA INFUSION
1400.0000 [IU] | Freq: Once | INTRAVENOUS | Status: AC
  Administered 2013-05-27: 1400 [IU] via INTRAVENOUS
  Filled 2013-05-27: qty 1400

## 2013-05-27 MED ORDER — INSULIN ASPART PROT & ASPART (70-30 MIX) 100 UNIT/ML ~~LOC~~ SUSP
35.0000 [IU] | Freq: Two times a day (BID) | SUBCUTANEOUS | Status: DC
  Administered 2013-05-27 – 2013-05-28 (×4): 35 [IU] via SUBCUTANEOUS
  Filled 2013-05-27: qty 10

## 2013-05-27 NOTE — Progress Notes (Signed)
RT placed patient on CPAP last night @ 2300 with home setting of 10 cmH2O. Patient has tolerated well all night. No problems noted.

## 2013-05-27 NOTE — Consult Note (Signed)
[facility] Face-to-Face Psychiatry Consult   Reason for Consult:  Panic episodes and multiple medical problems Referring Physician:  Dr. Karleen Hampshire Jasmine [name] is an 43 y.o. female. Total Time spent with patient: 45 minutes  Assessment: AXIS I:  Generalized Anxiety Disorder AXIS II:  Deferred AXIS III:   Past Medical History  Diagnosis Date  . Diastolic heart failure     echo 11/03/10 EF 55-60%  . Hypertension   . Chronic kidney disease (CKD)     stage II-III  . Hyperlipidemia   . Uncontrolled diabetes mellitus   . Diabetic ketoacidosis     with seizures  . Anemia   . Diabetic gastroparesis   . Obesity   . Coronary artery disease     mild per cath 2010  . 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 UTI (lower urinary tract infection) 01/19/2012  . Pneumonia due to unspecified Streptococcus 10/16/2012  . Acute on chronic diastolic heart failure 16/10/9602  . Acute on chronic renal failure 01/18/2012  . Acute respiratory failure with hypoxia 10/08/2012   AXIS IV:  economic problems, housing problems, other psychosocial or environmental problems, problems related to social environment and problems with primary support group AXIS V:  51-60 moderate symptoms  Plan:  Start Lexapro 10 mg PO qhs for depression and anxiety with patient consent Start Klonopin 0.5 mg Po BID for anxiety No evidence of imminent risk to self or others at present.   Patient does not meet criteria for psychiatric inpatient admission. Supportive therapy provided about ongoing stressors.  Subjective:   [name] is a 43 y.o. female patient admitted with anxiety.  HPI: Patient was seen for psychiatric consultation and reviewed the available chart. Patient significant other is at bed side and able to contribute to this evaluation. Jasmine [name] is a 43 y.o. Female, lives with significant other and works at [employer]. Psychatric consultation requested for management of panic episodes two days in a row since  she was admitted. Reportedly she has previous history of depression and panic episodes in her twenties and has no reported psychiatric treatment for a long time. She has stresses about family finances. She has reported SOB, chest tightness, feeling like choking, stomach nervousness and feeling like room is spinning and doom and unable to stand on her feet which last for several minutes. She has denied suicidal or homicidal ideation, intention or plans. She has no evidence of substance abuse and MVP or cardiac rhythm abnormalities.     Medical history:  Patient presented with prior h/o hypertension, chronic diastolic heart failure, sees Dr Sung Amabile as outpatient, OSA, DM type 1, asthma, obesity, stage 3 CKD, diabetic gastroparesis, non obstructive CAD, with h/o intubation for acute respiratory failure in February, comes in today for worsening left shoulder pain and left leg pain since 2 days. She also reports worsening sob over the last one week. She was seen at the office int he last one week and was given steroids. She reports her breathing hasnot improved, she also reports dry cough . She denies any other complaints. On arrival to the ED, she was found in diabetic ketoacidosis, hypoxic, hyponatremic, hyperkalemic. Currently she is on 4 lit [place] oxygen with sats in low 90's. Her CXR revealed mild interstitial edema. She was found to have elevated d dimer, and is awaiting V/Q SCAN for evaluation of PE. Her vascular studies of the left shoulder and and left LE negative for DVT. She is currently on IV glucostabilizer for DKA.  Review of Systems: As per H & P  HPI Elements:   Location:  Anxiety and depression. Quality:  acute. Severity:  psychosocial stresses. Timing:  multiple medical issues and psychosocial.  Past Psychiatric History: Past Medical History  Diagnosis Date  . Diastolic heart failure     echo 11/03/10 EF 55-60%  . Hypertension   . Chronic kidney disease (CKD)     stage II-III  .  Hyperlipidemia   . Uncontrolled diabetes mellitus   . Diabetic ketoacidosis     with seizures  . Anemia   . Diabetic gastroparesis   . Obesity   . Coronary artery disease     mild per cath 2010  . 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 UTI (lower urinary tract infection) 01/19/2012  . Pneumonia due to unspecified Streptococcus 10/16/2012  . Acute on chronic diastolic heart failure 29/56/2130  . Acute on chronic renal failure 01/18/2012  . Acute respiratory failure with hypoxia 10/08/2012    reports that she has never smoked. She has never used smokeless tobacco. She reports that she drinks alcohol. She reports that she does not use illicit drugs. Family History  Problem Relation Age of Onset  . Heart attack Mother   . Dementia Father      Living Arrangements: Spouse/significant other   Abuse/Neglect [facility]) Physical Abuse: Denies Verbal Abuse: Denies Sexual Abuse: Denies Allergies:   Allergies  Allergen Reactions  . Contrast Media [Iodinated Diagnostic Agents] Nausea And Vomiting  . Paroxetine Nausea And Vomiting  . Oxycodone Itching and Rash    ACT Assessment Complete:   Objective: Blood pressure 172/74, pulse 95, temperature 99.1 F (37.3 C), temperature source Axillary, resp. rate 19, height _0  (1.702 m), weight 125.3 kg (276 lb 3.8 oz), SpO2 100.00%.Body mass index is 43.25 kg/(m^2). Results for orders placed during the hospital encounter of 05/25/13 (from the past 72 hour(s))  CBG MONITORING, ED     Status: Abnormal   Collection Time    05/25/13  5:35 AM      Result Value Ref Range   Glucose-Capillary >600 (*) 70 - 99 mg/dL  CBC     Status: Abnormal   Collection Time    05/25/13  5:45 AM      Result Value Ref Range   WBC 17.3 (*) 4.0 - 10.5 K/uL   RBC 3.46 (*) 3.87 - 5.11 MIL/uL   Hemoglobin 9.9 (*) 12.0 - 15.0 g/dL   HCT 31.2 (*) 36.0 - 46.0 %   MCV 90.2  78.0 - 100.0 fL   MCH 28.6  26.0 - 34.0 pg   MCHC 31.7  30.0 - 36.0 g/dL   RDW 14.5   11.5 - 15.5 %   Platelets 342  150 - 400 K/uL  PRO B NATRIURETIC PEPTIDE     Status: Abnormal   Collection Time    05/25/13  5:45 AM      Result Value Ref Range   Pro B Natriuretic peptide (BNP) 3258.0 (*) 0 - 125 pg/mL  COMPREHENSIVE METABOLIC PANEL     Status: Abnormal   Collection Time    05/25/13  5:45 AM      Result Value Ref Range   Sodium 126 (*) 137 - 147 mEq/L   Potassium 5.4 (*) 3.7 - 5.3 mEq/L   Chloride 91 (*) 96 - 112 mEq/L   CO2 10 (*) 19 - 32 mEq/L   Comment: CRITICAL RESULT CALLED TO, READ BACK BY AND VERIFIED WITH:  NEILSEN,T/ED _0  ON 05/25/13 BY KARCZEWSKI,S.   Glucose, Bld 701 (*) 70 - 99 mg/dL   Comment: CRITICAL RESULT CALLED TO, READ BACK BY AND VERIFIED WITH:     NEILSEN,T/ED _1  ON 05/25/13 BY KARCZEWSKI,S.   BUN 64 (*) 6 - 23 mg/dL   Creatinine, Ser 2.02 (*) 0.50 - 1.10 mg/dL   Calcium 8.8  8.4 - 10.5 mg/dL   Total Protein 8.1  6.0 - 8.3 g/dL   Albumin 3.7  3.5 - 5.2 g/dL   AST 12  0 - 37 U/L   ALT 14  0 - 35 U/L   Alkaline Phosphatase 105  39 - 117 U/L   Total Bilirubin 0.4  0.3 - 1.2 mg/dL   GFR calc non Af Amer 29 (*) >90 mL/min   GFR calc Af Amer 34 (*) >90 mL/min   Comment: (NOTE)     The eGFR has been calculated using the CKD EPI equation.     This calculation has not been validated in all clinical situations.     eGFR's persistently <90 mL/min signify possible Chronic Kidney     Disease.  D-DIMER, QUANTITATIVE     Status: Abnormal   Collection Time    05/25/13  5:45 AM      Result Value Ref Range   D-Dimer, Quant 2.62 (*) 0.00 - 0.48 ug/mL-FEU   Comment:            AT THE INHOUSE ESTABLISHED CUTOFF     VALUE OF 0.48 ug/mL FEU,     THIS ASSAY HAS BEEN DOCUMENTED     IN THE LITERATURE TO HAVE     A SENSITIVITY AND NEGATIVE     PREDICTIVE VALUE OF AT LEAST     98 TO 99%.  THE TEST RESULT     SHOULD BE CORRELATED WITH     AN ASSESSMENT OF THE CLINICAL     PROBABILITY OF DVT / VTE.  CBG MONITORING, ED     Status: Abnormal    Collection Time    05/25/13  8:30 AM      Result Value Ref Range   Glucose-Capillary >600 (*) 70 - 99 mg/dL  CBG MONITORING, ED     Status: Abnormal   Collection Time    05/25/13  9:44 AM      Result Value Ref Range   Glucose-Capillary >600 (*) 70 - 99 mg/dL  BASIC METABOLIC PANEL     Status: Abnormal   Collection Time    05/25/13 10:50 AM      Result Value Ref Range   Sodium 129 (*) 137 - 147 mEq/L   Comment: REPEATED TO VERIFY   Potassium 4.9  3.7 - 5.3 mEq/L   Chloride 93 (*) 96 - 112 mEq/L   Comment: REPEATED TO VERIFY   CO2 11 (*) 19 - 32 mEq/L   Comment: REPEATED TO VERIFY   Glucose, Bld 598 (*) 70 - 99 mg/dL   Comment: CRITICAL RESULT CALLED TO, READ BACK BY AND VERIFIED WITH:     Jannetta Quint RN 1142 05/25/13 [place] V   BUN 67 (*) 6 - 23 mg/dL   Creatinine, Ser 2.09 (*) 0.50 - 1.10 mg/dL   Calcium 8.7  8.4 - 10.5 mg/dL   GFR calc non Af Amer 28 (*) >90 mL/min   GFR calc Af Amer 33 (*) >90 mL/min   Comment: (NOTE)     The eGFR has been calculated using the CKD EPI equation.  This calculation has not been validated in all clinical situations.     eGFR's persistently <90 mL/min signify possible Chronic Kidney     Disease.  CBG MONITORING, ED     Status: Abnormal   Collection Time    05/25/13 10:59 AM      Result Value Ref Range   Glucose-Capillary 513 (*) 70 - 99 mg/dL   Comment 1 Notify RN    CBG MONITORING, ED     Status: Abnormal   Collection Time    05/25/13 12:03 PM      Result Value Ref Range   Glucose-Capillary 382 (*) 70 - 99 mg/dL  CBG MONITORING, ED     Status: Abnormal   Collection Time    05/25/13  1:08 PM      Result Value Ref Range   Glucose-Capillary 202 (*) 70 - 99 mg/dL  BLOOD GAS, ARTERIAL     Status: Abnormal   Collection Time    05/25/13  1:27 PM      Result Value Ref Range   FIO2 0.40     O2 Content 4.5     Delivery systems NASAL CANNULA     pH, Arterial 7.362  7.350 - 7.450   pCO2 arterial 24.9 (*) 35.0 - 45.0 mmHg   pO2,  Arterial 58.6 (*) 80.0 - 100.0 mmHg   Bicarbonate 13.8 (*) 20.0 - 24.0 mEq/L   TCO2 12.9  0 - 100 mmol/L   Acid-base deficit 10.0 (*) 0.0 - 2.0 mmol/L   O2 Saturation 90.5     Patient temperature 98.6     Collection site BRACHIAL ARTERY     Drawn by [id number]     Sample type ARTERIAL DRAW     Allens test (pass/fail) PASS  PASS  CBG MONITORING, ED     Status: Abnormal   Collection Time    05/25/13  2:16 PM      Result Value Ref Range   Glucose-Capillary 154 (*) 70 - 99 mg/dL  GLUCOSE, CAPILLARY     Status: Abnormal   Collection Time    05/25/13  3:24 PM      Result Value Ref Range   Glucose-Capillary 137 (*) 70 - 99 mg/dL  MRSA PCR SCREENING     Status: Abnormal   Collection Time    05/25/13  3:34 PM      Result Value Ref Range   MRSA by PCR POSITIVE (*) NEGATIVE   Comment:            The GeneXpert MRSA Assay (FDA     approved for NASAL specimens     only), is one component of a     comprehensive MRSA colonization     surveillance program. It is not     intended to diagnose MRSA     infection nor to guide or     monitor treatment for     MRSA infections.     RESULT CALLED TO, READ BACK BY AND VERIFIED WITH:     A.MABRY RN AT [id number] ON [id number] BY C.BONGEL  BASIC METABOLIC PANEL     Status: Abnormal   Collection Time    05/25/13  4:22 PM      Result Value Ref Range   Sodium 132 (*) 137 - 147 mEq/L   Potassium 4.5  3.7 - 5.3 mEq/L   Chloride 99  96 - 112 mEq/L   CO2 15 (*) 19 - 32 mEq/L   Glucose, Bld 139 (*) 70 -  99 mg/dL   BUN 71 (*) 6 - 23 mg/dL   Creatinine, Ser 2.28 (*) 0.50 - 1.10 mg/dL   Calcium 9.1  8.4 - 10.5 mg/dL   GFR calc non Af Amer 25 (*) >90 mL/min   GFR calc Af Amer 29 (*) >90 mL/min   Comment: (NOTE)     The eGFR has been calculated using the CKD EPI equation.     This calculation has not been validated in all clinical situations.     eGFR's persistently <90 mL/min signify possible Chronic Kidney     Disease.  GLUCOSE, CAPILLARY     Status: Abnormal    Collection Time    05/25/13  4:41 PM      Result Value Ref Range   Glucose-Capillary 134 (*) 70 - 99 mg/dL  GLUCOSE, CAPILLARY     Status: Abnormal   Collection Time    05/25/13  5:44 PM      Result Value Ref Range   Glucose-Capillary 138 (*) 70 - 99 mg/dL  GLUCOSE, CAPILLARY     Status: Abnormal   Collection Time    05/25/13  6:56 PM      Result Value Ref Range   Glucose-Capillary 244 (*) 70 - 99 mg/dL  GLUCOSE, CAPILLARY     Status: Abnormal   Collection Time    05/25/13  8:01 PM      Result Value Ref Range   Glucose-Capillary 225 (*) 70 - 99 mg/dL  GLUCOSE, CAPILLARY     Status: Abnormal   Collection Time    05/25/13 10:22 PM      Result Value Ref Range   Glucose-Capillary 216 (*) 70 - 99 mg/dL   Comment 1 Documented in Chart     Comment 2 Notify RN    BASIC METABOLIC PANEL     Status: Abnormal   Collection Time    05/25/13 10:36 PM      Result Value Ref Range   Sodium 136 (*) 137 - 147 mEq/L   Comment: REPEATED TO VERIFY   Potassium 4.8  3.7 - 5.3 mEq/L   Comment: REPEATED TO VERIFY   Chloride 101  96 - 112 mEq/L   Comment: REPEATED TO VERIFY   CO2 13 (*) 19 - 32 mEq/L   Comment: REPEATED TO VERIFY   Glucose, Bld 230 (*) 70 - 99 mg/dL   BUN 71 (*) 6 - 23 mg/dL   Creatinine, Ser 2.15 (*) 0.50 - 1.10 mg/dL   Calcium 8.9  8.4 - 10.5 mg/dL   GFR calc non Af Amer 27 (*) >90 mL/min   GFR calc Af Amer 31 (*) >90 mL/min   Comment: (NOTE)     The eGFR has been calculated using the CKD EPI equation.     This calculation has not been validated in all clinical situations.     eGFR's persistently <90 mL/min signify possible Chronic Kidney     Disease.  GLUCOSE, CAPILLARY     Status: Abnormal   Collection Time    05/25/13 11:31 PM      Result Value Ref Range   Glucose-Capillary 196 (*) 70 - 99 mg/dL   Comment 1 Documented in Chart     Comment 2 Notify RN    URINALYSIS, ROUTINE W REFLEX MICROSCOPIC     Status: Abnormal   Collection Time    05/25/13 11:59 PM      Result  Value Ref Range   Color, Urine YELLOW  YELLOW   APPearance  CLOUDY (*) CLEAR   Specific Gravity, Urine 1.013  1.005 - 1.030   pH 5.0  5.0 - 8.0   Glucose, UA 500 (*) NEGATIVE mg/dL   Hgb urine dipstick NEGATIVE  NEGATIVE   Bilirubin Urine NEGATIVE  NEGATIVE   Ketones, ur NEGATIVE  NEGATIVE mg/dL   Protein, ur 30 (*) NEGATIVE mg/dL   Urobilinogen, UA 0.2  0.0 - 1.0 mg/dL   Nitrite NEGATIVE  NEGATIVE   Leukocytes, UA NEGATIVE  NEGATIVE  URINE MICROSCOPIC-ADD ON     Status: None   Collection Time    05/25/13 11:59 PM      Result Value Ref Range   Squamous Epithelial / LPF RARE  RARE   Urine-Other AMORPHOUS URATES/PHOSPHATES    GLUCOSE, CAPILLARY     Status: Abnormal   Collection Time    05/26/13 12:41 AM      Result Value Ref Range   Glucose-Capillary 198 (*) 70 - 99 mg/dL  GLUCOSE, CAPILLARY     Status: Abnormal   Collection Time    05/26/13  1:45 AM      Result Value Ref Range   Glucose-Capillary 171 (*) 70 - 99 mg/dL   Comment 1 Documented in Chart     Comment 2 Notify RN    GLUCOSE, CAPILLARY     Status: Abnormal   Collection Time    05/26/13  2:50 AM      Result Value Ref Range   Glucose-Capillary 150 (*) 70 - 99 mg/dL   Comment 1 Documented in Chart     Comment 2 Notify RN    BASIC METABOLIC PANEL     Status: Abnormal   Collection Time    05/26/13  3:40 AM      Result Value Ref Range   Sodium 136 (*) 137 - 147 mEq/L   Potassium 4.6  3.7 - 5.3 mEq/L   Chloride 102  96 - 112 mEq/L   CO2 17 (*) 19 - 32 mEq/L   Glucose, Bld 130 (*) 70 - 99 mg/dL   BUN 72 (*) 6 - 23 mg/dL   Creatinine, Ser 2.12 (*) 0.50 - 1.10 mg/dL   Calcium 9.0  8.4 - 10.5 mg/dL   GFR calc non Af Amer 28 (*) >90 mL/min   GFR calc Af Amer 32 (*) >90 mL/min   Comment: (NOTE)     The eGFR has been calculated using the CKD EPI equation.     This calculation has not been validated in all clinical situations.     eGFR's persistently <90 mL/min signify possible Chronic Kidney     Disease.  CBC WITH  DIFFERENTIAL     Status: Abnormal   Collection Time    05/26/13  3:40 AM      Result Value Ref Range   WBC 20.5 (*) 4.0 - 10.5 K/uL   RBC 3.19 (*) 3.87 - 5.11 MIL/uL   Hemoglobin 9.2 (*) 12.0 - 15.0 g/dL   HCT 27.8 (*) 36.0 - 46.0 %   MCV 87.1  78.0 - 100.0 fL   MCH 28.8  26.0 - 34.0 pg   MCHC 33.1  30.0 - 36.0 g/dL   RDW 14.6  11.5 - 15.5 %   Platelets 276  150 - 400 K/uL   Neutrophils Relative % 86 (*) 43 - 77 %   Neutro Abs 17.7 (*) 1.7 - 7.7 K/uL   Lymphocytes Relative 9 (*) 12 - 46 %   Lymphs Abs 1.8  0.7 -  4.0 K/uL   Monocytes Relative 5  3 - 12 %   Monocytes Absolute 1.0  0.1 - 1.0 K/uL   Eosinophils Relative 0  0 - 5 %   Eosinophils Absolute 0.1  0.0 - 0.7 K/uL   Basophils Relative 0  0 - 1 %   Basophils Absolute 0.0  0.0 - 0.1 K/uL  GLUCOSE, CAPILLARY     Status: Abnormal   Collection Time    05/26/13  3:57 AM      Result Value Ref Range   Glucose-Capillary 108 (*) 70 - 99 mg/dL   Comment 1 Documented in Chart     Comment 2 Notify RN    GLUCOSE, CAPILLARY     Status: Abnormal   Collection Time    05/26/13  5:01 AM      Result Value Ref Range   Glucose-Capillary 111 (*) 70 - 99 mg/dL   Comment 1 Documented in Chart     Comment 2 Notify RN    GLUCOSE, CAPILLARY     Status: Abnormal   Collection Time    05/26/13  6:07 AM      Result Value Ref Range   Glucose-Capillary 120 (*) 70 - 99 mg/dL   Comment 1 Documented in Chart     Comment 2 Notify RN    GLUCOSE, CAPILLARY     Status: Abnormal   Collection Time    05/26/13  7:11 AM      Result Value Ref Range   Glucose-Capillary 141 (*) 70 - 99 mg/dL   Comment 1 Documented in Chart     Comment 2 Notify RN    GLUCOSE, CAPILLARY     Status: Abnormal   Collection Time    05/26/13  8:08 AM      Result Value Ref Range   Glucose-Capillary 156 (*) 70 - 99 mg/dL  GLUCOSE, CAPILLARY     Status: Abnormal   Collection Time    05/26/13  9:23 AM      Result Value Ref Range   Glucose-Capillary 165 (*) 70 - 99 mg/dL   BASIC METABOLIC PANEL     Status: Abnormal   Collection Time    05/26/13  9:50 AM      Result Value Ref Range   Sodium 137  137 - 147 mEq/L   Potassium 4.9  3.7 - 5.3 mEq/L   Chloride 104  96 - 112 mEq/L   CO2 16 (*) 19 - 32 mEq/L   Glucose, Bld 173 (*) 70 - 99 mg/dL   BUN 68 (*) 6 - 23 mg/dL   Creatinine, Ser 2.02 (*) 0.50 - 1.10 mg/dL   Calcium 8.7  8.4 - 10.5 mg/dL   GFR calc non Af Amer 29 (*) >90 mL/min   GFR calc Af Amer 34 (*) >90 mL/min   Comment: (NOTE)     The eGFR has been calculated using the CKD EPI equation.     This calculation has not been validated in all clinical situations.     eGFR's persistently <90 mL/min signify possible Chronic Kidney     Disease.  GLUCOSE, CAPILLARY     Status: Abnormal   Collection Time    05/26/13 10:37 AM      Result Value Ref Range   Glucose-Capillary 170 (*) 70 - 99 mg/dL  GLUCOSE, CAPILLARY     Status: Abnormal   Collection Time    05/26/13 11:42 AM      Result Value Ref Range  Glucose-Capillary 165 (*) 70 - 99 mg/dL  PROCALCITONIN     Status: None   Collection Time    05/26/13 12:13 PM      Result Value Ref Range   Procalcitonin 0.52     Comment:            Interpretation:     PCT > 0.5 ng/mL and <= 2 ng/mL:     Systemic infection (sepsis) is possible,     but other conditions are known to elevate     PCT as well.     (NOTE)             ICU PCT Algorithm               Non ICU PCT Algorithm        ----------------------------     ------------------------------             PCT < 0.25 ng/mL                 PCT < 0.1 ng/mL         Stopping of antibiotics            Stopping of antibiotics           strongly encouraged.               strongly encouraged.        ----------------------------     ------------------------------           PCT level decrease by               PCT < 0.25 ng/mL           >= 80% from peak PCT           OR PCT 0.25 - 0.5 ng/mL          Stopping of antibiotics                                                  encouraged.         Stopping of antibiotics               encouraged.        ----------------------------     ------------------------------           PCT level decrease by              PCT >= 0.25 ng/mL           < 80% from peak PCT            AND PCT >= 0.5 ng/mL            Continuing antibiotics                                                  encouraged.           Continuing antibiotics                encouraged.        ----------------------------     ------------------------------         PCT level increase compared          PCT >  0.5 ng/mL             with peak PCT AND              PCT >= 0.5 ng/mL             Escalation of antibiotics                                              strongly encouraged.          Escalation of antibiotics            strongly encouraged.  GLUCOSE, CAPILLARY     Status: Abnormal   Collection Time    05/26/13 12:50 PM      Result Value Ref Range   Glucose-Capillary 210 (*) 70 - 99 mg/dL  BLOOD GAS, ARTERIAL     Status: Abnormal   Collection Time    05/26/13  1:46 PM      Result Value Ref Range   FIO2 1.00     Delivery systems OXYGEN MASK     pH, Arterial 7.335 (*) 7.350 - 7.450   pCO2 arterial 31.1 (*) 35.0 - 45.0 mmHg   pO2, Arterial 83.4  80.0 - 100.0 mmHg   Bicarbonate 16.1 (*) 20.0 - 24.0 mEq/L   TCO2 15.2  0 - 100 mmol/L   Acid-base deficit 8.3 (*) 0.0 - 2.0 mmol/L   O2 Saturation 95.4     Patient temperature 99.2     Collection site RIGHT RADIAL     Drawn by [id number]     Sample type ARTERIAL     Allens test (pass/fail) PASS  PASS  GLUCOSE, CAPILLARY     Status: Abnormal   Collection Time    05/26/13  2:25 PM      Result Value Ref Range   Glucose-Capillary 189 (*) 70 - 99 mg/dL  BASIC METABOLIC PANEL     Status: Abnormal   Collection Time    05/26/13  3:11 PM      Result Value Ref Range   Sodium 137  137 - 147 mEq/L   Potassium 4.6  3.7 - 5.3 mEq/L   Chloride 103  96 - 112 mEq/L   CO2 17 (*) 19 - 32 mEq/L   Glucose, Bld  196 (*) 70 - 99 mg/dL   BUN 66 (*) 6 - 23 mg/dL   Creatinine, Ser 2.09 (*) 0.50 - 1.10 mg/dL   Calcium 8.8  8.4 - 10.5 mg/dL   GFR calc non Af Amer 28 (*) >90 mL/min   GFR calc Af Amer 33 (*) >90 mL/min   Comment: (NOTE)     The eGFR has been calculated using the CKD EPI equation.     This calculation has not been validated in all clinical situations.     eGFR's persistently <90 mL/min signify possible Chronic Kidney     Disease.  TROPONIN I     Status: Abnormal   Collection Time    05/26/13  3:11 PM      Result Value Ref Range   Troponin I 8.07 (*) <0.30 ng/mL   Comment:            Due to the release kinetics of cTnI,     a negative result within the first hours     of the onset of symptoms does not rule out  myocardial infarction with certainty.     If myocardial infarction is still suspected,     repeat the test at appropriate intervals.     CRITICAL RESULT CALLED TO, READ BACK BY AND VERIFIED WITH:     Renato Gails [id number] @ [id number] BY J SCOTTON  GLUCOSE, CAPILLARY     Status: Abnormal   Collection Time    05/26/13  3:19 PM      Result Value Ref Range   Glucose-Capillary 177 (*) 70 - 99 mg/dL  GLUCOSE, CAPILLARY     Status: Abnormal   Collection Time    05/26/13  4:29 PM      Result Value Ref Range   Glucose-Capillary 151 (*) 70 - 99 mg/dL  APTT     Status: None   Collection Time    05/26/13  4:35 PM      Result Value Ref Range   aPTT 29  24 - 37 seconds  GLUCOSE, CAPILLARY     Status: Abnormal   Collection Time    05/26/13  5:33 PM      Result Value Ref Range   Glucose-Capillary 143 (*) 70 - 99 mg/dL  GLUCOSE, CAPILLARY     Status: Abnormal   Collection Time    05/26/13  6:38 PM      Result Value Ref Range   Glucose-Capillary 146 (*) 70 - 99 mg/dL  GLUCOSE, CAPILLARY     Status: Abnormal   Collection Time    05/26/13  7:23 PM      Result Value Ref Range   Glucose-Capillary 138 (*) 70 - 99 mg/dL  GLUCOSE, CAPILLARY     Status: Abnormal   Collection Time     05/26/13  8:32 PM      Result Value Ref Range   Glucose-Capillary 128 (*) 70 - 99 mg/dL  BASIC METABOLIC PANEL     Status: Abnormal   Collection Time    05/26/13  8:37 PM      Result Value Ref Range   Sodium 140  137 - 147 mEq/L   Potassium 4.6  3.7 - 5.3 mEq/L   Chloride 107  96 - 112 mEq/L   CO2 14 (*) 19 - 32 mEq/L   Glucose, Bld 132 (*) 70 - 99 mg/dL   BUN 67 (*) 6 - 23 mg/dL   Creatinine, Ser 2.11 (*) 0.50 - 1.10 mg/dL   Calcium 9.2  8.4 - 10.5 mg/dL   GFR calc non Af Amer 28 (*) >90 mL/min   GFR calc Af Amer 32 (*) >90 mL/min   Comment: (NOTE)     The eGFR has been calculated using the CKD EPI equation.     This calculation has not been validated in all clinical situations.     eGFR's persistently <90 mL/min signify possible Chronic Kidney     Disease.  TROPONIN I     Status: Abnormal   Collection Time    05/26/13  8:37 PM      Result Value Ref Range   Troponin I 7.47 (*) <0.30 ng/mL   Comment:            Due to the release kinetics of cTnI,     a negative result within the first hours     of the onset of symptoms does not rule out     myocardial infarction with certainty.     If myocardial infarction is still suspected,     repeat the test at appropriate intervals.  CRITICAL VALUE NOTED.  VALUE IS CONSISTENT WITH PREVIOUSLY REPORTED AND CALLED VALUE.  GLUCOSE, CAPILLARY     Status: Abnormal   Collection Time    05/26/13  9:27 PM      Result Value Ref Range   Glucose-Capillary 132 (*) 70 - 99 mg/dL  GLUCOSE, CAPILLARY     Status: Abnormal   Collection Time    05/26/13 10:36 PM      Result Value Ref Range   Glucose-Capillary 169 (*) 70 - 99 mg/dL  GLUCOSE, CAPILLARY     Status: Abnormal   Collection Time    05/26/13 11:14 PM      Result Value Ref Range   Glucose-Capillary 163 (*) 70 - 99 mg/dL  GLUCOSE, CAPILLARY     Status: Abnormal   Collection Time    05/27/13 12:32 AM      Result Value Ref Range   Glucose-Capillary 188 (*) 70 - 99 mg/dL  HEPARIN  LEVEL (UNFRACTIONATED)     Status: None   Collection Time    05/27/13  1:00 AM      Result Value Ref Range   Heparin Unfractionated 0.30  0.30 - 0.70 IU/mL   Comment:            IF HEPARIN RESULTS ARE BELOW     EXPECTED VALUES, AND PATIENT     DOSAGE HAS BEEN CONFIRMED,     SUGGEST FOLLOW UP TESTING     OF ANTITHROMBIN III LEVELS.  GLUCOSE, CAPILLARY     Status: Abnormal   Collection Time    05/27/13  1:35 AM      Result Value Ref Range   Glucose-Capillary 204 (*) 70 - 99 mg/dL  GLUCOSE, CAPILLARY     Status: Abnormal   Collection Time    05/27/13  2:27 AM      Result Value Ref Range   Glucose-Capillary 193 (*) 70 - 99 mg/dL  TROPONIN I     Status: Abnormal   Collection Time    05/27/13  3:10 AM      Result Value Ref Range   Troponin I 6.28 (*) <0.30 ng/mL   Comment:            Due to the release kinetics of cTnI,     a negative result within the first hours     of the onset of symptoms does not rule out     myocardial infarction with certainty.     If myocardial infarction is still suspected,     repeat the test at appropriate intervals.     CRITICAL VALUE NOTED.  VALUE IS CONSISTENT WITH PREVIOUSLY REPORTED AND CALLED VALUE.  BASIC METABOLIC PANEL     Status: Abnormal   Collection Time    05/27/13  3:10 AM      Result Value Ref Range   Sodium 138  137 - 147 mEq/L   Potassium 4.3  3.7 - 5.3 mEq/L   Chloride 106  96 - 112 mEq/L   CO2 17 (*) 19 - 32 mEq/L   Glucose, Bld 188 (*) 70 - 99 mg/dL   BUN 63 (*) 6 - 23 mg/dL   Creatinine, Ser 2.14 (*) 0.50 - 1.10 mg/dL   Calcium 8.7  8.4 - 10.5 mg/dL   GFR calc non Af Amer 27 (*) >90 mL/min   GFR calc Af Amer 32 (*) >90 mL/min   Comment: (NOTE)     The eGFR has been calculated using the CKD EPI equation.  This calculation has not been validated in all clinical situations.     eGFR's persistently <90 mL/min signify possible Chronic Kidney     Disease.  CBC WITH DIFFERENTIAL     Status: Abnormal   Collection Time     05/27/13  3:10 AM      Result Value Ref Range   WBC 13.1 (*) 4.0 - 10.5 K/uL   RBC 2.87 (*) 3.87 - 5.11 MIL/uL   Hemoglobin 8.3 (*) 12.0 - 15.0 g/dL   HCT 25.1 (*) 36.0 - 46.0 %   MCV 87.5  78.0 - 100.0 fL   MCH 28.9  26.0 - 34.0 pg   MCHC 33.1  30.0 - 36.0 g/dL   RDW 14.8  11.5 - 15.5 %   Platelets 267  150 - 400 K/uL   Neutrophils Relative % 77  43 - 77 %   Neutro Abs 10.2 (*) 1.7 - 7.7 K/uL   Lymphocytes Relative 14  12 - 46 %   Lymphs Abs 1.8  0.7 - 4.0 K/uL   Monocytes Relative 8  3 - 12 %   Monocytes Absolute 1.0  0.1 - 1.0 K/uL   Eosinophils Relative 1  0 - 5 %   Eosinophils Absolute 0.2  0.0 - 0.7 K/uL   Basophils Relative 0  0 - 1 %   Basophils Absolute 0.0  0.0 - 0.1 K/uL  PROTIME-INR     Status: None   Collection Time    05/27/13  3:10 AM      Result Value Ref Range   Prothrombin Time 14.6  11.6 - 15.2 seconds   INR 1.16  0.00 - 1.49  GLUCOSE, CAPILLARY     Status: Abnormal   Collection Time    05/27/13  3:31 AM      Result Value Ref Range   Glucose-Capillary 185 (*) 70 - 99 mg/dL  GLUCOSE, CAPILLARY     Status: Abnormal   Collection Time    05/27/13  4:27 AM      Result Value Ref Range   Glucose-Capillary 140 (*) 70 - 99 mg/dL  BASIC METABOLIC PANEL     Status: Abnormal   Collection Time    05/27/13  9:30 AM      Result Value Ref Range   Sodium 137  137 - 147 mEq/L   Potassium 4.4  3.7 - 5.3 mEq/L   Chloride 104  96 - 112 mEq/L   CO2 17 (*) 19 - 32 mEq/L   Glucose, Bld 152 (*) 70 - 99 mg/dL   BUN 62 (*) 6 - 23 mg/dL   Creatinine, Ser 2.10 (*) 0.50 - 1.10 mg/dL   Calcium 8.9  8.4 - 10.5 mg/dL   GFR calc non Af Amer 28 (*) >90 mL/min   GFR calc Af Amer 32 (*) >90 mL/min   Comment: (NOTE)     The eGFR has been calculated using the CKD EPI equation.     This calculation has not been validated in all clinical situations.     eGFR's persistently <90 mL/min signify possible Chronic Kidney     Disease.  HEPARIN LEVEL (UNFRACTIONATED)     Status: Abnormal    Collection Time    05/27/13  9:30 AM      Result Value Ref Range   Heparin Unfractionated 0.23 (*) 0.30 - 0.70 IU/mL   Comment:            IF HEPARIN RESULTS ARE BELOW  EXPECTED VALUES, AND PATIENT     DOSAGE HAS BEEN CONFIRMED,     SUGGEST FOLLOW UP TESTING     OF ANTITHROMBIN III LEVELS.   Labs are reviewed and are pertinent for as above reviewed.  Current Facility-Administered Medications  Medication Dose Route Frequency Provider Last Rate Last Dose  . albuterol (PROVENTIL) (2.5 MG/3ML) 0.083% nebulizer solution 2.5 mg  2.5 mg Nebulization Q6H PRN Minda Ditto, RPH   2.5 mg at 05/25/13 1857  . amLODipine (NORVASC) tablet 10 mg  10 mg Oral Daily Hosie Poisson, MD   10 mg at 05/27/13 0849  . antiseptic oral rinse (BIOTENE) solution 15 mL  15 mL Mouth Rinse q12n4p Hosie Poisson, MD      . atorvastatin (LIPITOR) tablet 80 mg  80 mg Oral q1800 Sueanne Margarita, MD   80 mg at 05/26/13 1751  . chlorhexidine (PERIDEX) 0.12 % solution 15 mL  15 mL Mouth Rinse BID Hosie Poisson, MD   15 mL at 05/27/13 0836  . Chlorhexidine Gluconate Cloth 2 % PADS 6 each  6 each Topical Q0600 Hosie Poisson, MD      . cloNIDine (CATAPRES) tablet 0.1 mg  0.1 mg Oral Q4H PRN Tanda Rockers, MD   0.1 mg at 05/26/13 2039  . dextrose 5 %-0.45 % sodium chloride infusion   Intravenous Continuous Hoy Morn, MD 50 mL/hr at 05/25/13 1314    . furosemide (LASIX) injection 80 mg  80 mg Intravenous 3 times per day Sueanne Margarita, MD   80 mg at 05/27/13 0529  . heparin ADULT infusion 100 units/mL (25000 units/250 mL)  1,550 Units/hr Intravenous Continuous Hosie Poisson, MD 15.5 mL/hr at 05/27/13 1029 1,550 Units/hr at 05/27/13 1029  . hydrALAZINE (APRESOLINE) injection 5 mg  5 mg Intravenous Q6H PRN Hosie Poisson, MD      . HYDROcodone-acetaminophen (NORCO/VICODIN) 5-325 MG per tablet 1 tablet  1 tablet Oral Q6H PRN Hosie Poisson, MD   1 tablet at 05/25/13 0952  . insulin aspart (novoLOG) injection 0-15 Units  0-15 Units  Subcutaneous Q4H Hosie Poisson, MD      . insulin aspart protamine- aspart (NOVOLOG MIX 70/30) injection 35 Units  35 Units Subcutaneous BID WC Hosie Poisson, MD   35 Units at 05/27/13 1025  . ipratropium-albuterol (DUONEB) 0.5-2.5 (3) MG/3ML nebulizer solution 3 mL  3 mL Nebulization Q6H PRN Hosie Poisson, MD   3 mL at 05/27/13 0835  . LORazepam (ATIVAN) injection 1-2 mg  1-2 mg Intravenous Q6H PRN Hosie Poisson, MD   2 mg at 05/26/13 1220  . metoprolol tartrate (LOPRESSOR) tablet 25 mg  25 mg Oral BID Hosie Poisson, MD   25 mg at 05/27/13 0849  . mupirocin ointment (BACTROBAN) 2 % 1 application  1 application Nasal BID Hosie Poisson, MD   1 application at 54/00/86 1030  . nitroGLYCERIN (NITROGLYN) 2 % ointment 0.5 inch  0.5 inch Topical 3 times per day Sanda Klein, MD   0.5 inch at 05/27/13 1036  . pantoprazole (PROTONIX) EC tablet 40 mg  40 mg Oral BID Tanda Rockers, MD   40 mg at 05/27/13 1025    Psychiatric Specialty Exam:     Blood pressure 172/74, pulse 95, temperature 99.1 F (37.3 C), temperature source Axillary, resp. rate 19, height _0  (1.702 m), weight 125.3 kg (276 lb 3.8 oz), SpO2 100.00%.Body mass index is 43.25 kg/(m^2).  General Appearance: Casual  Eye Contact::  Good  Speech:  Clear and  Coherent  Volume:  Decreased  Mood:  Anxious and Depressed  Affect:  Appropriate and Congruent  Thought Process:  Coherent and Goal Directed  Orientation:  Full (Time, Place, and Person)  Thought Content:  WDL  Suicidal Thoughts:  No  Homicidal Thoughts:  No  Memory:  Immediate;   Fair Recent;   Fair  Judgement:  Fair  Insight:  Fair  Psychomotor Activity:  Restlessness  Concentration:  Fair  Recall:  [employer] of Knowledge:Good  Language: Good  Akathisia:  NA  Handed:  Right  AIMS (if indicated):     Assets:  Communication Skills Desire for Improvement Financial Resources/Insurance Housing Intimacy Leisure Time Resilience Social  Support Talents/Skills Transportation Vocational/Educational  Sleep:      Musculoskeletal: Strength & Muscle Tone: within normal limits Gait & Station: normal Patient leans: Right  Treatment Plan Summary: Daily contact with patient to assess and evaluate symptoms and progress in treatment Medication management  Durward Parcel 05/27/2013 12:24 PM

## 2013-05-27 NOTE — Progress Notes (Signed)
Echocardiogram 2D Echocardiogram has been performed.  Jasmine GrumblesMelissa J Vianna [name] 05/27/2013, 8:30 AM

## 2013-05-27 NOTE — Progress Notes (Signed)
Patient Name: Jasmine [name] Date of Encounter: 05/27/2013  Active Problems:   DIABETES MELLITUS, TYPE I   DEPRESSION   Hypertension   OSA (obstructive sleep apnea)   Bilateral leg pain   Anemia, chronic disease   DKA (diabetic ketoacidoses)   Leukocytosis   Acute respiratory failure with hypoxia   Acute on chronic diastolic CHF (congestive heart failure), NYHA class 4   NSTEMI (non-ST elevated myocardial infarction)   Length of Stay: 2  SUBJECTIVE  Feels better, no chest tightness. Per nurse report she has intermittent episodes of hypoxia (60% range) associated with anxiety, pallor and possible stridor, not wheezing, sinus tachycardia. They resolve very slowly with bronchodilators and anxiolytics (diuretics and nitrates have not been employed).  Troponin peaked at 8, trending down Bedside review of echo images suggests normal overall EF. Wall motion is difficult to evaluate (the apex is particularly poorly visualized).  CURRENT MEDS . amLODipine  10 mg Oral Daily  . antiseptic oral rinse  15 mL Mouth Rinse q12n4p  . atorvastatin  80 mg Oral q1800  . chlorhexidine  15 mL Mouth Rinse BID  . Chlorhexidine Gluconate Cloth  6 each Topical Q0600  . furosemide  80 mg Intravenous 3 times per day  . insulin aspart  0-15 Units Subcutaneous Q4H  . insulin aspart protamine- aspart  35 Units Subcutaneous BID WC  . metoprolol tartrate  25 mg Oral BID  . mupirocin ointment  1 application Nasal BID  . pantoprazole  40 mg Oral BID    OBJECTIVE   Intake/Output Summary (Last 24 hours) at 05/27/13 0954 Last data filed at 05/27/13 0600  Gross per 24 hour  Intake 1309.37 ml  Output   3375 ml  Net -2065.63 ml   Filed Weights   05/25/13 0637 05/25/13 1527 05/27/13 0300  Weight: 275 lb (124.739 kg) 279 lb 15.8 oz (127 kg) 276 lb 3.8 oz (125.3 kg)    PHYSICAL EXAM Filed Vitals:   05/27/13 0300 05/27/13 0400 05/27/13 0600 05/27/13 0800  BP:  155/79 168/82 172/74  Pulse:  95 95 95   Temp:  99.3 F (37.4 C)  99.1 F (37.3 C)  TempSrc:  Axillary  Axillary  Resp:  24 25 19   Height:      Weight: 276 lb 3.8 oz (125.3 kg)     SpO2:  100% 95% 100%   General: Alert, oriented x3, no distress, obese Head: no evidence of trauma, PERRL, EOMI, no exophtalmos or lid lag, no myxedema, no xanthelasma; normal ears, nose and oropharynx Neck: hard to see the jugular venous pulsations and no hepatojugular reflux; brisk carotid pulses without delay and no carotid bruits Chest: clear to auscultation, no signs of consolidation by percussion or palpation, normal fremitus, symmetrical and full respiratory excursions Cardiovascular: cannot locate the apical impulse, regular rhythm, normal first and second heart sounds, no rubs or gallops, no murmur Abdomen: no tenderness or distention, no masses by palpation, no abnormal pulsatility or arterial bruits, normal bowel sounds, no hepatosplenomegaly Extremities: no clubbing, cyanosis or edema; 2+ radial, ulnar and brachial pulses bilaterally; 2+ right femoral, posterior tibial and dorsalis pedis pulses; 2+ left femoral, posterior tibial and dorsalis pedis pulses; no subclavian or femoral bruits Neurological: grossly nonfocal  LABS  CBC  Recent Labs  05/26/13 0340 05/27/13 0310  WBC 20.5* 13.1*  NEUTROABS 17.7* 10.2*  HGB 9.2* 8.3*  HCT 27.8* 25.1*  MCV 87.1 87.5  PLT 276 267   Basic Metabolic Panel  Recent Labs  05/26/13 2037 05/27/13 0310  NA 140 138  K 4.6 4.3  CL 107 106  CO2 14* 17*  GLUCOSE 132* 188*  BUN 67* 63*  CREATININE 2.11* 2.14*  CALCIUM 9.2 8.7   Liver Function Tests  Recent Labs  05/25/13 0545  AST 12  ALT 14  ALKPHOS 105  BILITOT 0.4  PROT 8.1  ALBUMIN 3.7   No results found for this basename: LIPASE, AMYLASE,  in the last 72 hours Cardiac Enzymes  Recent Labs  05/26/13 1511 05/26/13 2037 05/27/13 0310  TROPONINI 8.07* 7.47* 6.28*   BNP No components found with this basename: POCBNP,   D-Dimer  Recent Labs  05/25/13 0545  DDIMER 2.62*   Radiology Studies Imaging results have been reviewed and Nm Pulmonary Perf And Vent  05/25/2013   CLINICAL DATA:  Shortness of breath and elevated D-dimer.  EXAM: NUCLEAR MEDICINE VENTILATION - PERFUSION LUNG SCAN  TECHNIQUE: Ventilation images were obtained in multiple projections using inhaled aerosol technetium 99 M DTPA. Perfusion images were obtained in multiple projections after intravenous injection of Tc-7889m MAA.  RADIOPHARMACEUTICALS:  38 mCi Tc-8989m DTPA aerosol and 5 mCi Tc-5689m MAA  COMPARISON:  Chest radiograph 05/25/2013  FINDINGS: Ventilation: Pooling of tracer activity demonstrated in the esophagus. Limited activity demonstrated in the lungs but visualized activity is homogeneous and symmetrical.  Perfusion: Normal homogeneous distribution of tracer activity throughout both lungs. No focal perfusion defects demonstrated.  IMPRESSION: Low probability of pulmonary embolus.   Electronically Signed   By: Burman NievesWilliam  Stevens M.D.   On: 05/25/2013 22:05   Dg Chest Port 1 View  05/26/2013   CLINICAL DATA:  Shortness of breath.  EXAM: PORTABLE CHEST - 1 VIEW  COMPARISON:  05/25/2013.  FINDINGS: The heart is enlarged. There is a fulminant pattern of pulmonary edema. No definite pleural effusions.  IMPRESSION: Interval development of fulminant pulmonary edema.   Electronically Signed   By: Loralie ChampagneMark  Gallerani M.D.   On: 05/26/2013 14:47    TELE Sinus rhythm  ECG NSR, delayed anterior R wave progression (old)  ASSESSMENT AND PLAN NSTEMI - second time in last few months. Troponin leak is more compelling this time. 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 Will go ahead with a [employer] Myoview tomorrow. If there is a large/anterior defect she should have a cardiac cath (right and left), acknowledging the nephrotoxicity risk. If the perfusion pattern is normal, forego cardiac cath and treat medically for CAD and diastolic dysfunction.  Thurmon FairMihai Jennifer Payes, MD, [facility] Center[facility] [phone] office [phone] pager 05/27/2013 9:54 AM

## 2013-05-27 NOTE — Progress Notes (Signed)
[facility] PROGRESS NOTE  Jaella L Sapp ZHY:[id number] DOB: 08-18-1970 DOA: 05/25/2013 PCP: Dois DavenportICHTER,KAREN L., MD  Assessment/Plan:  Acute respiratory failure with hypoxia: Secondary to acute on chronic diastolic heart failure.  Oxygen to keep sat's>90%. On cpap at night. She had respiratory distress earlier today and repeat CXR showed fulminant pulmonary edema. Ordered one dose of 80 MG IV lasix, followed by 80 mg every 8 hours. Bronchodilators PRN.  Repeat Echocardiogram ordered. Serial troponin's. Cardiology consulted for further recommendations.  Plan for MYO VIEW in am.  Repeat CXR pending.    Diabetic Ketoacidosis:  Admitted to step down. Started on IV glucostabilizer. Transitioned to sq novolog 70/30 mmhg.  SSI. Marland Kitchen. So far we could not find a source of infection.   Hyponatremia: A combination of pseudohyponatremia from hyperglycemia and fluid overload.  Repeat level ordered and improved.  Hyperkalemia;  Resolved.  - repeat, on IV insulin.   CKD stage 3;  - her creatinine is actually better than baseline.  - continue to monitor.   Leukocytosis:  - unclear etiology.  - she is afebrile.  -? Reactive. Improving.   Anemia:  - appears to be at baseline.  - secondary to anemia of chronic disease.   Elevated D dimer:  - V/Q scan negative or low prob for PE.   Left shoulder pain:  - probably musculoskeletal.  - left arm venous duplex neg for DVT.  - if the pain doesn 't improve will scan with CT.   DVT prophylaxis    Code Status: full code  Family Communication: none at bedside  Disposition Plan: remain in stepdown.          Consultants:  Pulmonary consult   Cardiology  psychiatry  Procedures:  NM SCAN- low probability of pulmonary embolus  CXR vascular congestion.   Antibiotics:  none  HPI/Subjective: No panic attacks today, more comfortable.   Objective: Filed Vitals:   05/27/13 0800  BP: 172/74  Pulse: 95  Temp: 99.1 F (37.3  C)  Resp: 19    Intake/Output Summary (Last 24 hours) at 05/27/13 0911 Last data filed at 05/27/13 0600  Gross per 24 hour  Intake 1309.37 ml  Output   3375 ml  Net -2065.63 ml   Filed Weights   05/25/13 0637 05/25/13 1527 05/27/13 0300  Weight: 124.739 kg (275 lb) 127 kg (279 lb 15.8 oz) 125.3 kg (276 lb 3.8 oz)    Exam:   General:  Alert , on [place] oxygen speaking softly,. comfortable  Cardiovascular: s1s2, tachycardic  Respiratory: scattered rales at bases, no wheezing heard.   Abdomen: soft NT ND BS+  Musculoskeletal: trace pedal edema.   Data Reviewed: Basic Metabolic Panel:  Recent Labs Lab 05/26/13 0340 05/26/13 0950 05/26/13 1511 05/26/13 2037 05/27/13 0310  NA 136* 137 137 140 138  K 4.6 4.9 4.6 4.6 4.3  CL 102 104 103 107 106  CO2 17* 16* 17* 14* 17*  GLUCOSE 130* 173* 196* 132* 188*  BUN 72* 68* 66* 67* 63*  CREATININE 2.12* 2.02* 2.09* 2.11* 2.14*  CALCIUM 9.0 8.7 8.8 9.2 8.7   Liver Function Tests:  Recent Labs Lab 05/25/13 0545  AST 12  ALT 14  ALKPHOS 105  BILITOT 0.4  PROT 8.1  ALBUMIN 3.7   No results found for this basename: LIPASE, AMYLASE,  in the last 168 hours No results found for this basename: AMMONIA,  in the last 168 hours CBC:  Recent Labs Lab 05/25/13 0545 05/26/13 0340 05/27/13 0310  WBC 17.3* 20.5* 13.1*  NEUTROABS  --  17.7* 10.2*  HGB 9.9* 9.2* 8.3*  HCT 31.2* 27.8* 25.1*  MCV 90.2 87.1 87.5  PLT 342 276 267   Cardiac Enzymes:  Recent Labs Lab 05/26/13 1511 05/26/13 2037 05/27/13 0310  TROPONINI 8.07* 7.47* 6.28*   BNP (last 3 results)  Recent Labs  11/01/12 1446 03/24/13 1200 05/25/13 0545  PROBNP 2441.0* 6479.0* 3258.0*   CBG:  Recent Labs Lab 05/27/13 0032 05/27/13 0135 05/27/13 0227 05/27/13 0331 05/27/13 0427  GLUCAP 188* 204* 193* 185* 140*    Recent Results (from the past 240 hour(s))  MRSA PCR SCREENING     Status: Abnormal   Collection Time    05/25/13  3:34 PM       Result Value Ref Range Status   MRSA by PCR POSITIVE (*) NEGATIVE Final   Comment:            The GeneXpert MRSA Assay (FDA     approved for NASAL specimens     only), is one component of a     comprehensive MRSA colonization     surveillance program. It is not     intended to diagnose MRSA     infection nor to guide or     monitor treatment for     MRSA infections.     RESULT CALLED TO, READ BACK BY AND VERIFIED WITH:     A.MABRY RN AT 1732 ON [id number] BY C.BONGEL     Studies: Nm Pulmonary Perf And Vent  05/25/2013   CLINICAL DATA:  Shortness of breath and elevated D-dimer.  EXAM: NUCLEAR MEDICINE VENTILATION - PERFUSION LUNG SCAN  TECHNIQUE: Ventilation images were obtained in multiple projections using inhaled aerosol technetium 99 M DTPA. Perfusion images were obtained in multiple projections after intravenous injection of Tc-3966m MAA.  RADIOPHARMACEUTICALS:  38 mCi Tc-466m DTPA aerosol and 5 mCi Tc-2766m MAA  COMPARISON:  Chest radiograph 05/25/2013  FINDINGS: Ventilation: Pooling of tracer activity demonstrated in the esophagus. Limited activity demonstrated in the lungs but visualized activity is homogeneous and symmetrical.  Perfusion: Normal homogeneous distribution of tracer activity throughout both lungs. No focal perfusion defects demonstrated.  IMPRESSION: Low probability of pulmonary embolus.   Electronically Signed   By: Burman NievesWilliam  Stevens M.D.   On: 05/25/2013 22:05   Dg Chest Port 1 View  05/26/2013   CLINICAL DATA:  Shortness of breath.  EXAM: PORTABLE CHEST - 1 VIEW  COMPARISON:  05/25/2013.  FINDINGS: The heart is enlarged. There is a fulminant pattern of pulmonary edema. No definite pleural effusions.  IMPRESSION: Interval development of fulminant pulmonary edema.   Electronically Signed   By: Loralie ChampagneMark  Gallerani M.D.   On: 05/26/2013 14:47    Scheduled Meds: . amLODipine  10 mg Oral Daily  . antiseptic oral rinse  15 mL Mouth Rinse q12n4p  . atorvastatin  80 mg Oral q1800  .  chlorhexidine  15 mL Mouth Rinse BID  . Chlorhexidine Gluconate Cloth  6 each Topical Q0600  . furosemide  80 mg Intravenous 3 times per day  . metoprolol tartrate  25 mg Oral BID  . mupirocin ointment  1 application Nasal BID  . pantoprazole  40 mg Oral BID   Continuous Infusions: . dextrose 5 % and 0.45% NaCl 50 mL/hr at 05/25/13 1314  . heparin 1,400 Units/hr (05/27/13 0230)  . insulin (NOVOLIN-R) infusion Stopped (05/27/13 0840)    Active Problems:   DIABETES MELLITUS, TYPE I   DEPRESSION  Hypertension   OSA (obstructive sleep apnea)   Bilateral leg pain   Anemia, chronic disease   DKA (diabetic ketoacidoses)   Leukocytosis   Acute respiratory failure with hypoxia   Acute on chronic diastolic CHF (congestive heart failure), NYHA class 4   NSTEMI (non-ST elevated myocardial infarction)    Time spent: 45 minutes    Kathlen Mody  [facility] Pager [phone] If 7PM-7AM, please contact night-coverage at [email or web address], password [email or web address] 05/27/2013, 9:11 AM  LOS: 2 days

## 2013-05-27 NOTE — Progress Notes (Signed)
ANTICOAGULATION CONSULT NOTE - follow up  Pharmacy Consult for Heparin Indication: chest pain/ACS  Assessment: Heparin level below target range at 0.17 units/ml on 1550 units/hr. Per RN, there have been no interruptions in the infusion or problems with the IV site. No bleeding reported/documented.   Plan: Rebolus with 1400 units then increase infusion to 1800 units/hr.  Recheck heparin level in 6 hours.  Jasmine [name], PharmD, pager [phone]. 05/27/2013,5:32 PM.

## 2013-05-27 NOTE — Progress Notes (Signed)
ANTICOAGULATION CONSULT NOTE  Pharmacy Consult for Heparin Indication: chest pain/ACS  Allergies  Allergen Reactions  . Contrast Media [Iodinated Diagnostic Agents] Nausea And Vomiting  . Paroxetine Nausea And Vomiting  . Oxycodone Itching and Rash    Patient Measurements: Height: 5\' 7"  (170.2 cm) Weight: 276 lb 3.8 oz (125.3 kg) IBW/kg (Calculated) : 61.6 Heparin Dosing Weight: 92kg  Vital Signs: Temp: 99.1 F (37.3 C) (05/03 0800) Temp src: Axillary (05/03 0800) BP: 172/74 mmHg (05/03 0800) Pulse Rate: 95 (05/03 0800)  Labs:  Recent Labs  05/25/13 0545  05/26/13 0340  05/26/13 1511 05/26/13 1635 05/26/13 2037 05/27/13 0100 05/27/13 0310 05/27/13 0930  HGB 9.9*  --  9.2*  --   --   --   --   --  8.3*  --   HCT 31.2*  --  27.8*  --   --   --   --   --  25.1*  --   PLT 342  --  276  --   --   --   --   --  267  --   APTT  --   --   --   --   --  29  --   --   --   --   LABPROT  --   --   --   --   --   --   --   --  14.6  --   INR  --   --   --   --   --   --   --   --  1.16  --   HEPARINUNFRC  --   --   --   --   --   --   --  0.30  --  0.23*  CREATININE 2.02*  < > 2.12*  < > 2.09*  --  2.11*  --  2.14*  --   TROPONINI  --   --   --   --  8.07*  --  7.47*  --  6.28*  --   < > = values in this interval not displayed.  Estimated Creatinine Clearance: 47.1 ml/min (by C-G formula based on Cr of 2.14).   Medical History: Past Medical History  Diagnosis Date  . Diastolic heart failure     echo 11/03/10 EF 55-60%  . Hypertension   . Chronic kidney disease (CKD)     stage II-III  . Hyperlipidemia   . Uncontrolled diabetes mellitus   . Diabetic ketoacidosis     with seizures  . Anemia   . Diabetic gastroparesis   . Obesity   . Coronary artery disease     mild per cath 2010  . 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 UTI (lower urinary tract infection) 01/19/2012  . Pneumonia due to unspecified Streptococcus 10/16/2012  . Acute on chronic  diastolic heart failure 01/18/2012  . Acute on chronic renal failure 01/18/2012  . Acute respiratory failure with hypoxia 10/08/2012    Medications:  Prescriptions prior to admission  Medication Sig Dispense Refill  . acetaminophen (TYLENOL) 500 MG tablet Take 500 mg by mouth every 6 (six) hours as needed for mild pain.      Marland Kitchen. albuterol (PROVENTIL HFA;VENTOLIN HFA) 108 (90 BASE) MCG/ACT inhaler Inhale 2 puffs into the lungs every 6 (six) hours as needed for wheezing.  1 Inhaler  2  . amLODipine (NORVASC) 10 MG tablet Take 10 mg by mouth daily.      .Marland Kitchen  aspirin 81 MG tablet Take 81 mg by mouth daily as needed. For pain      . atorvastatin (LIPITOR) 40 MG tablet Take 1 tablet (40 mg total) by mouth daily at 6 PM.  90 tablet  3  . ferrous sulfate 325 (65 FE) MG tablet Take 325 mg by mouth 2 (two) times daily.        . furosemide (LASIX) 80 MG tablet Take 80 mg by mouth 2 (two) times daily.      Marland Kitchen. HYDROcodone-acetaminophen (NORCO) 5-325 MG per tablet Take 1 tablet by mouth every 6 (six) hours as needed for moderate pain.  30 tablet  0  . insulin NPH-regular Human (NOVOLIN 70/30) (70-30) 100 UNIT/ML injection Inject 35-40 Units into the skin 2 (two) times daily with a meal. 40 in the morning and 35 in the evening      . Multiple Vitamin (MULTIVITAMIN WITH MINERALS) TABS tablet Take 1 tablet by mouth daily.       Assessment: [age] yoF with T2DM and HF admitted with left shoulder and left leg pain for 2 days with worsening SOB, found to be in diabetic ketoacidosis. Also noted with elevated D-dimer.  Dopplers and VQ scan negative for VTE, however elevated troponins to 8 and pharmacy consulted to start IV heparin for NSTEMI.  Cards following - unclear if NSTEMI d/t acute thrombotic event or demand ischemia. [facility]    5/3: D2 IV heparin for NSTEMI.    Heparin dosing weight =92kg  Troponins trending down   CBC: Hgb decreased to 8.3, plts ok - watch closely.    No bleeding or  infusion issues per RN  CKD -stable but concern if cardiac cath required   Baseline PT/INR, aPPT WNL  Heparin infusion at 1400 units/hr, increased this AM after heparin level at low end of therapeutic range.   Heparin level recheck this AM is now subtherapeutic at 0.23, despite increase in infusion rate.    Goal of Therapy:  Heparin level 0.3-0.7 units/ml Monitor platelets by anticoagulation protocol: Yes   Plan:   Give heparin 1400 units IV bolus (15 units/kg) then increase infusion to 1550 units/hr.  Check heparin level in 6hrs and daily therafter.  Check CBC q48h while on heparin.  F/u daily.  Haynes Hoehnolleen Mellina Benison, PharmD, BCPS 05/27/2013, 10:05 AM  Pager: [phone]

## 2013-05-27 NOTE — Progress Notes (Signed)
ANTICOAGULATION CONSULT NOTE - Follow Up Consult  Pharmacy Consult for Heparin Indication: chest pain/ACS  Allergies  Allergen Reactions  . Contrast Media [Iodinated Diagnostic Agents] Nausea And Vomiting  . Paroxetine Nausea And Vomiting  . Oxycodone Itching and Rash    Patient Measurements: Height: 5\' 7"  (170.2 cm) Weight: 279 lb 15.8 oz (127 kg) IBW/kg (Calculated) : 61.6 Heparin Dosing Weight:   Vital Signs: Temp: 100 F (37.8 C) (05/02 2000) Temp src: Axillary (05/02 2000) BP: 153/84 mmHg (05/03 0200) Pulse Rate: 95 (05/03 0200)  Labs:  Recent Labs  05/25/13 0545  05/26/13 0340 05/26/13 0950 05/26/13 1511 05/26/13 1635 05/26/13 2037 05/27/13 0100 05/27/13 0310  HGB 9.9*  --  9.2*  --   --   --   --   --  8.3*  HCT 31.2*  --  27.8*  --   --   --   --   --  25.1*  PLT 342  --  276  --   --   --   --   --  267  APTT  --   --   --   --   --  29  --   --   --   HEPARINUNFRC  --   --   --   --   --   --   --  0.30  --   CREATININE 2.02*  < > 2.12* 2.02* 2.09*  --  2.11*  --   --   TROPONINI  --   --   --   --  8.07*  --  7.47*  --   --   < > = values in this interval not displayed.  Estimated Creatinine Clearance: 48.1 ml/min (by C-G formula based on Cr of 2.11).   Medications:  Infusions:  . dextrose 5 % and 0.45% NaCl 50 mL/hr at 05/25/13 1314  . heparin 1,400 Units/hr (05/27/13 0230)  . insulin (NOVOLIN-R) infusion 2.9 Units/hr (05/27/13 0130)    Assessment: Patient with ACS.  Heparin level at goal but lowest level.  No issues per RN.  Goal of Therapy:  Heparin level 0.3-0.7 units/ml Monitor platelets by anticoagulation protocol: Yes   Plan:  Increase heparin to 1400 units/hr Recheck level at 0900  Hexion Specialty ChemicalsJulian Crowford Michaelann Gunnoe Jr. 05/27/2013,3:31 AM

## 2013-05-27 NOTE — Progress Notes (Signed)
RT placed patient on CPAP with home setting of 10 cmH2O . Sterile water added to water chamber for humidification. Patient tolerating well. RT will continue to monitor as needed.

## 2013-05-28 DIAGNOSIS — R4589 Other symptoms and signs involving emotional state: Secondary | ICD-10-CM

## 2013-05-28 DIAGNOSIS — N184 Chronic kidney disease, stage 4 (severe): Secondary | ICD-10-CM

## 2013-05-28 LAB — CBC
HCT: 24.9 % — ABNORMAL LOW (ref 36.0–46.0)
Hemoglobin: 8.1 g/dL — ABNORMAL LOW (ref 12.0–15.0)
MCH: 28.9 pg (ref 26.0–34.0)
MCHC: 32.5 g/dL (ref 30.0–36.0)
MCV: 88.9 fL (ref 78.0–100.0)
Platelets: 267 10*3/uL (ref 150–400)
RBC: 2.8 MIL/uL — AB (ref 3.87–5.11)
RDW: 14.8 % (ref 11.5–15.5)
WBC: 10.6 10*3/uL — AB (ref 4.0–10.5)

## 2013-05-28 LAB — GLUCOSE, CAPILLARY
GLUCOSE-CAPILLARY: 173 mg/dL — AB (ref 70–99)
GLUCOSE-CAPILLARY: 86 mg/dL (ref 70–99)
Glucose-Capillary: 153 mg/dL — ABNORMAL HIGH (ref 70–99)
Glucose-Capillary: 97 mg/dL (ref 70–99)
Glucose-Capillary: 109 mg/dL — ABNORMAL HIGH (ref 70–99)

## 2013-05-28 LAB — HEMOGLOBIN A1C
HEMOGLOBIN A1C: 7.7 % — AB (ref <5.7)
MEAN PLASMA GLUCOSE: 174 mg/dL — AB (ref <117)

## 2013-05-28 LAB — HEPARIN LEVEL (UNFRACTIONATED)
HEPARIN UNFRACTIONATED: 0.37 [IU]/mL (ref 0.30–0.70)
Heparin Unfractionated: 0.21 IU/mL — ABNORMAL LOW (ref 0.30–0.70)
Heparin Unfractionated: 0.4 IU/mL (ref 0.30–0.70)

## 2013-05-28 LAB — BASIC METABOLIC PANEL
BUN: 61 mg/dL — ABNORMAL HIGH (ref 6–23)
CO2: 18 mEq/L — ABNORMAL LOW (ref 19–32)
CREATININE: 2.23 mg/dL — AB (ref 0.50–1.10)
Calcium: 8.8 mg/dL (ref 8.4–10.5)
Chloride: 105 mEq/L (ref 96–112)
GFR calc non Af Amer: 26 mL/min — ABNORMAL LOW (ref >90)
GFR, EST AFRICAN AMERICAN: 30 mL/min — AB (ref >90)
Glucose, Bld: 147 mg/dL — ABNORMAL HIGH (ref 70–99)
Potassium: 4.3 mEq/L (ref 3.7–5.3)
SODIUM: 137 meq/L (ref 137–147)

## 2013-05-28 LAB — PROCALCITONIN: Procalcitonin: 0.8 ng/mL

## 2013-05-28 LAB — PROTIME-INR
INR: 1.11 (ref 0.00–1.49)
Prothrombin Time: 14.1 seconds (ref 11.6–15.2)

## 2013-05-28 MED ORDER — ASPIRIN 81 MG PO CHEW
81.0000 mg | CHEWABLE_TABLET | Freq: Every day | ORAL | Status: DC
  Administered 2013-05-28 – 2013-05-30 (×3): 81 mg via ORAL
  Filled 2013-05-28 (×3): qty 1

## 2013-05-28 MED ORDER — HEPARIN (PORCINE) IN NACL 100-0.45 UNIT/ML-% IJ SOLN
2050.0000 [IU]/h | INTRAMUSCULAR | Status: DC
  Administered 2013-05-28 – 2013-05-29 (×3): 2050 [IU]/h via INTRAVENOUS
  Filled 2013-05-28 (×5): qty 250

## 2013-05-28 MED ORDER — INSULIN ASPART 100 UNIT/ML ~~LOC~~ SOLN
0.0000 [IU] | Freq: Three times a day (TID) | SUBCUTANEOUS | Status: DC
  Administered 2013-05-29 – 2013-05-30 (×3): 3 [IU] via SUBCUTANEOUS

## 2013-05-28 NOTE — Progress Notes (Addendum)
Inpatient Diabetes Program Recommendations  AACE/ADA: New Consensus Statement on Inpatient Glycemic Control (2013)  Target Ranges:  Prepandial:   less than 140 mg/dL      Peak postprandial:   less than 180 mg/dL (1-2 hours)      Critically ill patients:  140 - 180 mg/dL  Results for Celesta GentileMONROE, Bernardina L (MRN [id number]) as of 05/28/2013 13:09  Ref. Range 05/27/2013 08:38 05/27/2013 09:42 05/27/2013 11:41 05/27/2013 15:30 05/27/2013 19:48 05/27/2013 23:43 05/28/2013 00:17 05/28/2013 03:53  Glucose-Capillary Latest Range: 70-99 mg/dL 782124 (H) 956139 (H) 213188 (H) 133 (H) 115 (H) 62 (L) 86 153 (H)   Diabetes history: DM2 Outpatient Diabetes medications: 70/30 40 units in the AM and 70/30 35 units in the PM Current orders for Inpatient glycemic control: 70/30 35 units BID, Novolog 0-15 units Q4H  Note: Noted low blood glucose of 62 mg/dl on 5/4 at 08:6523:43.  In reviewing the chart, patient's CBG at 15:30 was 133 mg/dl and 784115 mg/dl at 69:6219:48. Patient received Novolog 2 units for correction at 18:10 (not sure what glucose value was used to base correction on since last CBG result prior to insulin given was 3 hours old).  Question if Novolog correction would have been needed if CBG would have been check within 30 minutes prior to insulin administration. Anticipate low due to Novolog correction received.  Will continue to follow.  Thanks, Orlando PennerMarie Byrd, RN, MSN, CCRN Diabetes Coordinator Inpatient Diabetes Program [phone] (Team Pager) [phone] (AP office) [phone] [facility] office)  Addendum: Just spoke with patient in room.  She states that she thinks her extremely high glucose was from "back to back" steroid injections.  States prior to last injection, CBG was 200 mg/dl in MD office.  Later up to over 600.  States nothing else has changed.  Patient states that when she return home she is to start Lantus 50 units at HS, Humalog 10 units tid with meals, and stop 70/30 per her PCP, Nadyne CoombesKaren Richter, MD.  Her insurance no longer  covers 70/30.  Was waiting to start Lantus and Humalog after her supply arrived in the mail.  It has arrived and is at home.  MD may want to consider switching to Lantus and Novolog regimen while here in hospital.  Thank you.  Kee Drudge S. Elsie Lincolnouth, RN, CNS, CDE Inpatient Diabetes Program, team pager [phone]

## 2013-05-28 NOTE — Progress Notes (Signed)
Primary Cardiologist Dr. Gala RomneyBensimhon  Subjective:  Feels better, no CP. No SOB.   Objective:  Vital Signs in the last 24 hours: Temp:  [98.6 F (37 C)-99.4 F (37.4 C)] 99.2 F (37.3 C) (05/04 0400) Pulse Rate:  [81-98] 85 (05/04 0800) Resp:  [16-31] 25 (05/04 0800) BP: (146-179)/(53-84) 179/80 mmHg (05/04 0800) SpO2:  [96 %-100 %] 100 % (05/04 0800) FiO2 (%):  [35 %] 35 % (05/03 0835) Weight:  [268 lb 1.3 oz (121.6 kg)] 268 lb 1.3 oz (121.6 kg) (05/04 0400)  Intake/Output from previous day: 05/03 0701 - 05/04 0700 In: 1365.6 [P.O.:960; I.V.:405.6] Out: 4625 [Urine:4625]   Physical Exam: General: Well developed, well nourished, in no acute distress. Head:  Normocephalic and atraumatic. Lungs: Clear to auscultation and percussion. Heart: Normal S1 and S2.  No murmur, rubs or gallops.  Abdomen: soft, non-tender, positive bowel sounds. Overweight.  Extremities: No clubbing or cyanosis. No edema. Neurologic: Alert and oriented x 3.    Lab Results:  Recent Labs  05/27/13 0310 05/28/13 0110  WBC 13.1* 10.6*  HGB 8.3* 8.1*  PLT 267 267    Recent Labs  05/27/13 0930 05/28/13 0110  NA 137 137  K 4.4 4.3  CL 104 105  CO2 17* 18*  GLUCOSE 152* 147*  BUN 62* 61*  CREATININE 2.10* 2.23*    Recent Labs  05/26/13 2037 05/27/13 0310  TROPONINI 7.47* 6.28*   Imaging: Dg Chest Port 1 View  05/27/2013   CLINICAL DATA:  Followup pulmonary edema  EXAM: PORTABLE CHEST - 1 VIEW  COMPARISON:  05/26/2013  FINDINGS: Pulmonary edema has mildly improved.  No new lung opacities.  No convincing pleural effusion.  No pneumothorax.  Cardiac silhouette is normal in size.  IMPRESSION: Mild improvement in pulmonary edema.   Electronically Signed   By: Amie Portlandavid  Ormond M.D.   On: 05/27/2013 14:28   Dg Chest Port 1 View  05/26/2013   CLINICAL DATA:  Shortness of breath.  EXAM: PORTABLE CHEST - 1 VIEW  COMPARISON:  05/25/2013.  FINDINGS: The heart is enlarged. There is a fulminant pattern  of pulmonary edema. No definite pleural effusions.  IMPRESSION: Interval development of fulminant pulmonary edema.   Electronically Signed   By: Loralie ChampagneMark  Gallerani M.D.   On: 05/26/2013 14:47    Telemetry: NO VT, no adverse rhythms. Personally viewed.   EKG:  NSR, PRWP, NSSTW changes.   Cardiac Studies:  ECHO - EF 55%, moderate LVH  Assessment/Plan:  Active Problems:   DIABETES MELLITUS, TYPE I   DEPRESSION   Hypertension   OSA (obstructive sleep apnea)   Bilateral leg pain   Anemia, chronic disease   DKA (diabetic ketoacidoses)   Leukocytosis   Acute respiratory failure with hypoxia   Acute on chronic diastolic CHF (congestive heart failure), NYHA class 4   NSTEMI (non-ST elevated myocardial infarction)  43 year old with NSTEMI, CKD.   1) NSTEMI  - Await Lexiscan NUC  - Cath 2010 - non obstructive CAD (30% LAD proximal, mid and distal lesions, 60% diagonal, 30% Dominant mid circumflex)  - no angina  - ?DKA contributing.   - Trop peaked 8, normal EF  - If the perfusion pattern is normal, forego cardiac cath and treat medically for CAD and diastolic dysfunction (concern over AKI with contrast)  2) Obesity   - encourage weight loss  3) HTN  - meds reviewed  4) DKA   - improved.     Donato SchultzMark Skains 05/28/2013, 8:21  AM     

## 2013-05-28 NOTE — Progress Notes (Signed)
ANTICOAGULATION CONSULT NOTE - Follow Up Consult  Pharmacy Consult for Heparin Indication: NSTEMI  Allergies  Allergen Reactions  . Contrast Media [Iodinated Diagnostic Agents] Nausea And Vomiting  . Paroxetine Nausea And Vomiting  . Oxycodone Itching and Rash    Patient Measurements: Height: 5\' 7"  (170.2 cm) Weight: 268 lb 1.3 oz (121.6 kg) IBW/kg (Calculated) : 61.6 Heparin Dosing Weight: 92kg  Vital Signs: Temp: 98.5 F (36.9 C) (05/04 1600) Temp src: Oral (05/04 1600) BP: 146/60 mmHg (05/04 1400) Pulse Rate: 81 (05/04 1400)  Labs:  Recent Labs  05/26/13 0340  05/26/13 1511 05/26/13 1635 05/26/13 2037  05/27/13 0310 05/27/13 0930  05/28/13 0110 05/28/13 0951 05/28/13 1606  HGB 9.2*  --   --   --   --   --  8.3*  --   --  8.1*  --   --   HCT 27.8*  --   --   --   --   --  25.1*  --   --  24.9*  --   --   PLT 276  --   --   --   --   --  267  --   --  267  --   --   APTT  --   --   --  29  --   --   --   --   --   --   --   --   LABPROT  --   --   --   --   --   --  14.6  --   --  14.1  --   --   INR  --   --   --   --   --   --  1.16  --   --  1.11  --   --   HEPARINUNFRC  --   --   --   --   --   < >  --  0.23*  < > 0.21* 0.40 0.37  CREATININE 2.12*  < > 2.09*  --  2.11*  --  2.14* 2.10*  --  2.23*  --   --   TROPONINI  --   --  8.07*  --  7.47*  --  6.28*  --   --   --   --   --   < > = values in this interval not displayed.  Estimated Creatinine Clearance: 44.4 ml/min (by C-G formula based on Cr of 2.23).   Medications:  Infusions:  . heparin 2,050 Units/hr (05/28/13 1218)    Assessment: [age] yoF admitted 5/1 with shoulder pain x2 days, and found to be in DKA with acute on chronic diastolic CHF, and NSTEMI.  Pharmacy is consulted to dose IV Heparin on 5/2.   Heparin Level: 0.4, therapeutic  CBC: Hgb 8.1, Plt 267  SCr increased to 2.23  Troponins: 8.07, 7.47, 6.28  No bleeding or infusion complications reported.  Goal of Therapy:  Heparin  level 0.3-0.7 units/ml Monitor platelets by anticoagulation protocol: Yes   Plan:   Continue heparin IV infusion at 2050 units/hr (20.5 ml/hr)  Heparin level in 6 hours to confirm therapeutic level.  Daily heparin level and CBC  Continue to monitor H&H and platelets   Lynann Beaverhristine Shade PharmD, BCPS Pager [phone] 05/28/2013 4:35 PM  ADDENDUM:   Confirmation heparin level remains therapeutic at 0.37 (goal 0.3-0.7).  No changes, continue infusion at current rate and f/u daily HL tomorrow morning.  Haynes Hoehnolleen Alvah Lagrow, PharmD, BCPS 05/28/2013, 4:36 PM  Pager: [phone]

## 2013-05-28 NOTE — Progress Notes (Signed)
Pt midnight accucheck was 62, pt asymptomatic, 1 cup of orange juice given, 1 pack of graham crackers and peanut butter given on 15 minute recheck pt accucheck was 86. Pt remains assymptomatic, additional snack placed at bedside table. Will continue to monitor.

## 2013-05-28 NOTE — Progress Notes (Signed)
ANTICOAGULATION CONSULT NOTE - Follow Up Consult  Pharmacy Consult for Heparin Indication: NSTEMI  Allergies  Allergen Reactions  . Contrast Media [Iodinated Diagnostic Agents] Nausea And Vomiting  . Paroxetine Nausea And Vomiting  . Oxycodone Itching and Rash    Patient Measurements: Height: 5\' 7"  (170.2 cm) Weight: 276 lb 3.8 oz (125.3 kg) IBW/kg (Calculated) : 61.6 Heparin Dosing Weight:   Vital Signs: Temp: 98.6 F (37 C) (05/04 0000) Temp src: Axillary (05/04 0000) BP: 152/57 mmHg (05/04 0000) Pulse Rate: 95 (05/04 0000)  Labs:  Recent Labs  05/26/13 0340  05/26/13 1511 05/26/13 1635 05/26/13 2037  05/27/13 0310 05/27/13 0930 05/27/13 1632 05/28/13 0110  HGB 9.2*  --   --   --   --   --  8.3*  --   --  8.1*  HCT 27.8*  --   --   --   --   --  25.1*  --   --  24.9*  PLT 276  --   --   --   --   --  267  --   --  267  APTT  --   --   --  29  --   --   --   --   --   --   LABPROT  --   --   --   --   --   --  14.6  --   --  14.1  INR  --   --   --   --   --   --  1.16  --   --  1.11  HEPARINUNFRC  --   --   --   --   --   < >  --  0.23* 0.17* 0.21*  CREATININE 2.12*  < > 2.09*  --  2.11*  --  2.14* 2.10*  --  2.23*  TROPONINI  --   --  8.07*  --  7.47*  --  6.28*  --   --   --   < > = values in this interval not displayed.  Estimated Creatinine Clearance: 45.2 ml/min (by C-G formula based on Cr of 2.23).   Medications:  Infusions:  . dextrose 5 % and 0.45% NaCl 50 mL/hr at 05/25/13 1314  . heparin      Assessment: Patient with low heparin level.  No issues per RN.  Goal of Therapy:  Heparin level 0.3-0.7 units/ml Monitor platelets by anticoagulation protocol: Yes   Plan:  Increase heparin to 2050 units/hr Recheck level at 1000  [employer] Specialty ChemicalsJulian Crowford Loeta Herst Jr. 05/28/2013,2:57 AM

## 2013-05-28 NOTE — Progress Notes (Signed)
ANTICOAGULATION CONSULT NOTE - Follow Up Consult  Pharmacy Consult for Heparin Indication: NSTEMI  Allergies  Allergen Reactions  . Contrast Media [Iodinated Diagnostic Agents] Nausea And Vomiting  . Paroxetine Nausea And Vomiting  . Oxycodone Itching and Rash    Patient Measurements: Height: 5\' 7"  (170.2 cm) Weight: 268 lb 1.3 oz (121.6 kg) IBW/kg (Calculated) : 61.6 Heparin Dosing Weight: 92kg  Vital Signs: Temp: 99.2 F (37.3 C) (05/04 0400) Temp src: Oral (05/04 0400) BP: 179/80 mmHg (05/04 0800) Pulse Rate: 85 (05/04 0800)  Labs:  Recent Labs  05/26/13 0340  05/26/13 1511 05/26/13 1635 05/26/13 2037  05/27/13 0310 05/27/13 0930 05/27/13 1632 05/28/13 0110 05/28/13 0951  HGB 9.2*  --   --   --   --   --  8.3*  --   --  8.1*  --   HCT 27.8*  --   --   --   --   --  25.1*  --   --  24.9*  --   PLT 276  --   --   --   --   --  267  --   --  267  --   APTT  --   --   --  29  --   --   --   --   --   --   --   LABPROT  --   --   --   --   --   --  14.6  --   --  14.1  --   INR  --   --   --   --   --   --  1.16  --   --  1.11  --   HEPARINUNFRC  --   --   --   --   --   < >  --  0.23* 0.17* 0.21* 0.40  CREATININE 2.12*  < > 2.09*  --  2.11*  --  2.14* 2.10*  --  2.23*  --   TROPONINI  --   --  8.07*  --  7.47*  --  6.28*  --   --   --   --   < > = values in this interval not displayed.  Estimated Creatinine Clearance: 44.4 ml/min (by C-G formula based on Cr of 2.23).   Medications:  Infusions:  . dextrose 5 % and 0.45% NaCl 50 mL/hr at 05/25/13 1314  . heparin 2,050 Units/hr (05/28/13 0300)    Assessment: 42 yoF admitted 5/1 with shoulder pain x2 days, and found to be in DKA with acute on chronic diastolic CHF, and NSTEMI.  Pharmacy is consulted to dose IV Heparin on 5/2.   Heparin Level: 0.4, therapeutic  CBC: Hgb 8.1, Plt 267  SCr increased to 2.23  Troponins: 8.07, 7.47, 6.28  No bleeding or infusion complications reported.  Goal of Therapy:   Heparin level 0.3-0.7 units/ml Monitor platelets by anticoagulation protocol: Yes   Plan:   Continue heparin IV infusion at 2050 units/hr (20.5 ml/hr)  Heparin level in 6 hours to confirm therapeutic level.  Daily heparin level and CBC  Continue to monitor H&H and platelets   Lynann Beaverhristine Angelyse Heslin PharmD, BCPS Pager [phone] 05/28/2013 10:35 AM

## 2013-05-28 NOTE — Progress Notes (Signed)
CARE MANAGEMENT NOTE 05/28/2013  Patient:  Jasmine [name],Jasmine [name]   Account Number:  [id number]  Date Initiated:  05/28/2013  Documentation initiated by:  Nevae Pinnix  Subjective/Objective Assessment:   pt admitted on 05012015/confirmed nstemi on 05022015/admitted due to sepsis, dka and chest pain.     Action/Plan:   tbd based on progression of multiple disease states.   Anticipated DC Date:  05/31/2013   Anticipated DC Plan:  HOME W HOME HEALTH SERVICES  In-house referral  NA      DC Planning Services  NA      [facility]  [facility]   Choice offered to / List presented to:  NA   DME arranged  NA      DME agency  NA     HH arranged  NA      HH agency  NA   Status of service:  In process, will continue to follow Medicare Important Message given?  NO (If response is "NO", the following Medicare IM given date fields will be blank) Date Medicare IM given:   Date Additional Medicare IM given:    Discharge Disposition:    Per UR Regulation:  Reviewed for med. necessity/level of care/duration of stay  If discussed at Long Length of Stay Meetings, dates discussed:    Comments:  [id number]/Estevon Fluke Stark JockDavis, RN, BSN, [place] [phone] Chart Reviewed for discharge and hospital needs. Discharge needs at time of review: None present will follow for needs. Review of patient progress due on [id number].

## 2013-05-28 NOTE — Progress Notes (Signed)
Clinical Social Work Department CLINICAL SOCIAL WORK PSYCHIATRY SERVICE LINE ASSESSMENT 05/28/2013  Patient:  Jasmine [name]  Account:  [id number]  Admit Date:  05/25/2013  Clinical Social Worker:  Sindy Messing, LCSW  Date/Time:  05/28/2013 04:00 PM Referred by:  Physician  Date referred:  05/28/2013 Reason for Referral  Psychosocial assessment   Presenting Symptoms/Problems (In the person's/family's own words):   Psych consulted due to anxiety.   Abuse/Neglect/Trauma History (check all that apply)  Denies history   Abuse/Neglect/Trauma Comments:   Psychiatric History (check all that apply)  Outpatient treatment   Psychiatric medications:  Lexapro 10 mg  Klonopin 0.5 mg   Current Mental Health Hospitalizations/Previous Mental Health History:   Patient reports that she was diagnosed with depression about 10 years ago due to personal stressors. Patient reports that she received medication management and therapy and that symptoms improved. Patient stopped taking medication about 5 years ago due to feeling better and not feeling that medication was still needed.   Current provider:   PCP   Place and Date:   [place], [place]   Current Medications:   Scheduled Meds:      . amLODipine  10 mg Oral Daily  . antiseptic oral rinse  15 mL Mouth Rinse q12n4p  . aspirin  81 mg Oral Daily  . atorvastatin  80 mg Oral q1800  . chlorhexidine  15 mL Mouth Rinse BID  . Chlorhexidine Gluconate Cloth  6 each Topical Q0600  . clonazePAM  0.5 mg Oral BID  . escitalopram  10 mg Oral QHS  . furosemide  80 mg Intravenous 3 times per day  . insulin aspart  0-15 Units Subcutaneous Q4H  . insulin aspart protamine- aspart  35 Units Subcutaneous BID WC  . metoprolol tartrate  25 mg Oral BID  . mupirocin ointment  1 application Nasal BID  . nitroGLYCERIN  0.5 inch Topical 3 times per day  . pantoprazole  40 mg Oral BID        Continuous Infusions:      . heparin 2,050 Units/hr (05/28/13 1218)      PRN Meds:.albuterol, cloNIDine, hydrALAZINE, HYDROcodone-acetaminophen, ipratropium-albuterol, LORazepam       Previous Impatient Admission/Date/Reason:   None reported   Emotional Health / Current Symptoms    Suicide/Self Harm  None reported   Suicide attempt in the past:   Patient denies any SI or HI and denies any previous attempts.   Other harmful behavior:   None reported   Psychotic/Dissociative Symptoms  None reported   Other Psychotic/Dissociative Symptoms:   N/A    Attention/Behavioral Symptoms  Within Normal Limits   Other Attention / Behavioral Symptoms:   Patient engaged during assessment.    Cognitive Impairment  Within Normal Limits   Other Cognitive Impairment:   Patient alert and oriented.    Mood and Adjustment  Mood Congruent    Stress, Anxiety, Trauma, Any Recent Loss/Stressor  Anxiety   Anxiety (frequency):   Patient reports anxiety at home but states that symptoms increase when hospitalized.   Phobia (specify):   N/A   Compulsive behavior (specify):   N/A   Obsessive behavior (specify):   N/A   Other:   N/A   Substance Abuse/Use  None   SBIRT completed (please refer for detailed history):  N  Self-reported substance use:   Patient politely declined to complete SBIRT. Patient denies all drug use and reports she drinks 1 alcoholic drink every once in awhile. Patient denies that alcohol consumption  is related to emotions.   Urinary Drug Screen Completed:  N Alcohol level:   N/A    Environmental/Housing/Living Arrangement  Stable housing   Who is in the home:   Fiance   Emergency contact:  Psychologist, clinical   Patient's Strengths and Goals (patient's own words):   Patient reports strong support system. Patient agreeable to treatment and aware of symptoms.   Clinical Social Worker's Interpretive Summary:   CSW received referral in order to complete psychosocial assessment. CSW reviewed  chart and met with patient at bedside. CSW introduced myself and explained role.    Patient reports that she lives at home with fiance and that he has two children that stay with them occasionally. Patient works at [employer] and reports that she was doing well prior to admission to the hospital. Patient states that she always becomes more anxious while hospitalized and that she has experienced some heart attacks since being here.    CSW and patient discussed how medical problems and MH problems could be related. Patient reports that due to situational factors she became severely depressed about 10 years ago. Patient reports that she was sleeping more and eating more and did not have any motivation. Patient reports she went from working a full-time and part-time job and going to school to not even getting out of bed. Patient acknowledged that she needed help and went to therapy and to see a psychiatrist. Patient reports that after 5 years she started feeling better and has been without medication.    Patient reports that she was unaware how much anxiety was affecting her life and is happy to know she was started on medication. CSW and patient discussed outpatient follow up and patient is agreeable to psychiatrist referrals. CSW will continue to follow and will provide support during hospital stay.   Disposition:  Outpatient referral made/needed   [place], [place] [phone]

## 2013-05-28 NOTE — Progress Notes (Signed)
[facility] PROGRESS NOTE  Martin L Atkins WUJ:[id number] DOB: 06/07/1970 DOA: 05/25/2013 PCP: Dois DavenportICHTER,KAREN L., MD Interim summary: Jasmine [name] is a 43 y.o. female with prior h/o hypertension, chronic diastolic heart failure, sees Dr Jones BroomBensihmon as outpatient, OSA, DM type 1, asthma, obesity, stage 3 CKD, diabetic gastroparesis, non obstructive CAD, with h/o intubation for acute respiratory failure in February, comes in today for worsening left shoulder pain and left leg pain since 2 days. She also reports worsening sob over the last one week. She was seen at the office int he last one week and was given steroids. She reports her breathing hasnot improved, she also reports dry cough . She denies any other complaints. On arrival to the ED, she was found in diabetic ketoacidosis, hypoxic, hyponatremic, hyperkalemic. Currently she is on 4 lit [place] oxygen with sats in low 90's. Her CXR revealed mild interstitial edema. She was found to have elevated d dimer, and is awaiting V/Q SCAN for evaluation of PE. Her vascular studies of the left shoulder and and left LE negative for DVT. She is then referred to medical service for admission. She was on IV insulin and later transitioned to sq novolog 70/30 and her cbg's have been well controlled.  On Day 2 pt had 2 episodes of panic attacks, when she reports unable to breath, profuse sweating, associated withtachycardia, tachypnic, and hypoxic, usually resolved with bronchodilators and venti mask oxygen. We obtained a CXR showing fulminant pulmonary edema, increased her lasix to 80 mg TID. Her serial troponins peaked at 8, at which time she was started on IV heparin and cardiology consulted. She underwent cardiac cath in 2010 which showed mild disease,  non obstructive CAD (30% LAD proximal, mid and distal lesions, 60% diagonal, 30% Dominant mid circumflex. Meanwhile she never actually complained of any chest pain or chest tightness during the hospitalization so far.  Plan is to perform lexiscan .    Assessment/Plan:  Acute respiratory failure with hypoxia: Secondary to acute on chronic diastolic heart failure  Oxygen to keep sat's>90%. On cpap at night. She had respiratory distress earlier today and repeat CXR showed fulminant pulmonary edema. Ordered one dose of 80 MG IV lasix, followed by 80 mg every 8 hours. Bronchodilators PRN.  Repeat Echocardiogram ordered. Serial troponin's. Cardiology consulted for further recommendations.   Repeat CXR showed mild improvement in the pulmonary edema. Cardiology plans to do a lexiscan. We wil wait recommendations.  Echocardiogram showed preserved LVEF and grade 2 diastolic dysfunction. Regional wall motion abn cannot be excluded from the study.    Diabetic Ketoacidosis:  Admitted to step down. Started on IV glucostabilizer. Transitioned to sq novolog 70/30 mmhg.  SSI. Marland Kitchen. So far we could not find a source of infection.  CBG (last 3)   Recent Labs  05/27/13 2343 05/28/13 0017 05/28/13 0353  GLUCAP 62* 86 153*   NSTEMI: - currently on aspirin, b blocker, statin and heparin IV.  - plan for lexiscan as per cardiology.  She currently denies any chest pain or sob.  Echo done and EKG unchanged from previous EKG's   Panic attacks/ ? Vocal cord dysfunction: - patient had 3 to4  Episodes of panic attacks during the weekend with unable to breathe, profuse sweating and hypoxia. Lasts from half n hour to a couple of hours. Usually resolves with bronchodilators and oxygen.  - psychiatry consulted for medication assistance, if they are actually panic attacks.    Hyponatremia: A combination of pseudohyponatremia from hyperglycemia and fluid overload.  Repeat level ordered and improved.  Hyperkalemia;  Resolved.    CKD stage 3;  - her creatinine is 2.23, slightly worse than on admission, probably from the diuretics.  - continue to monitor. Will call renal assistance if she needs a catheterization.    Leukocytosis:  - unclear etiology.  - she is afebrile.  -? Reactive. Improving.   Anemia:  - appears to be at baseline.  - secondary to anemia of chronic disease.   Elevated D dimer:  - V/Q scan negative or low prob for PE.   Left shoulder pain:  - probably musculoskeletal.  - left arm venous duplex neg for DVT.  - if the pain doesn 't improve will scan with CT.   DVT prophylaxis    Code Status: full code  Family Communication: none at bedside  Disposition Plan: transfer to telemetry.         Consultants:  Cardiology  psychiatry  Procedures:  NM SCAN- low probability of pulmonary embolus  CXR vascular congestion.   Antibiotics:  none  HPI/Subjective: No panic attacks today, more comfortable.   Objective: Filed Vitals:   05/28/13 0800  BP: 179/80  Pulse: 85  Temp:   Resp: 25    Intake/Output Summary (Last 24 hours) at 05/28/13 0832 Last data filed at 05/28/13 0700  Gross per 24 hour  Intake 1365.59 ml  Output   4625 ml  Net -3259.41 ml   Filed Weights   05/25/13 1527 05/27/13 0300 05/28/13 0400  Weight: 127 kg (279 lb 15.8 oz) 125.3 kg (276 lb 3.8 oz) 121.6 kg (268 lb 1.3 oz)    Exam:   General:  Alert , on [place] oxygen speaking softly,. comfortable  Cardiovascular: s1s2, tachycardic  Respiratory: scattered rales at bases, no wheezing heard.   Abdomen: soft NT ND BS+  Musculoskeletal: trace pedal edema.   Data Reviewed: Basic Metabolic Panel:  Recent Labs Lab 05/26/13 1511 05/26/13 2037 05/27/13 0310 05/27/13 0930 05/28/13 0110  NA 137 140 138 137 137  K 4.6 4.6 4.3 4.4 4.3  CL 103 107 106 104 105  CO2 17* 14* 17* 17* 18*  GLUCOSE 196* 132* 188* 152* 147*  BUN 66* 67* 63* 62* 61*  CREATININE 2.09* 2.11* 2.14* 2.10* 2.23*  CALCIUM 8.8 9.2 8.7 8.9 8.8   Liver Function Tests:  Recent Labs Lab 05/25/13 0545  AST 12  ALT 14  ALKPHOS 105  BILITOT 0.4  PROT 8.1  ALBUMIN 3.7   No results found for this basename:  LIPASE, AMYLASE,  in the last 168 hours No results found for this basename: AMMONIA,  in the last 168 hours CBC:  Recent Labs Lab 05/25/13 0545 05/26/13 0340 05/27/13 0310 05/28/13 0110  WBC 17.3* 20.5* 13.1* 10.6*  NEUTROABS  --  17.7* 10.2*  --   HGB 9.9* 9.2* 8.3* 8.1*  HCT 31.2* 27.8* 25.1* 24.9*  MCV 90.2 87.1 87.5 88.9  PLT 342 276 267 267   Cardiac Enzymes:  Recent Labs Lab 05/26/13 1511 05/26/13 2037 05/27/13 0310  TROPONINI 8.07* 7.47* 6.28*   BNP (last 3 results)  Recent Labs  11/01/12 1446 03/24/13 1200 05/25/13 0545  PROBNP 2441.0* 6479.0* 3258.0*   CBG:  Recent Labs Lab 05/27/13 1530 05/27/13 1948 05/27/13 2343 05/28/13 0017 05/28/13 0353  GLUCAP 133* 115* 62* 86 153*    Recent Results (from the past 240 hour(s))  MRSA PCR SCREENING     Status: Abnormal   Collection Time    05/25/13  3:34  PM      Result Value Ref Range Status   MRSA by PCR POSITIVE (*) NEGATIVE Final   Comment:            The GeneXpert MRSA Assay (FDA     approved for NASAL specimens     only), is one component of a     comprehensive MRSA colonization     surveillance program. It is not     intended to diagnose MRSA     infection nor to guide or     monitor treatment for     MRSA infections.     RESULT CALLED TO, READ BACK BY AND VERIFIED WITH:     A.MABRY RN AT 1732 ON [id number] BY C.BONGEL     Studies: Dg Chest Port 1 View  05/27/2013   CLINICAL DATA:  Followup pulmonary edema  EXAM: PORTABLE CHEST - 1 VIEW  COMPARISON:  05/26/2013  FINDINGS: Pulmonary edema has mildly improved.  No new lung opacities.  No convincing pleural effusion.  No pneumothorax.  Cardiac silhouette is normal in size.  IMPRESSION: Mild improvement in pulmonary edema.   Electronically Signed   By: Amie Portlandavid  Ormond M.D.   On: 05/27/2013 14:28   Dg Chest Port 1 View  05/26/2013   CLINICAL DATA:  Shortness of breath.  EXAM: PORTABLE CHEST - 1 VIEW  COMPARISON:  05/25/2013.  FINDINGS: The heart is  enlarged. There is a fulminant pattern of pulmonary edema. No definite pleural effusions.  IMPRESSION: Interval development of fulminant pulmonary edema.   Electronically Signed   By: Loralie ChampagneMark  Gallerani M.D.   On: 05/26/2013 14:47    Scheduled Meds: . amLODipine  10 mg Oral Daily  . antiseptic oral rinse  15 mL Mouth Rinse q12n4p  . atorvastatin  80 mg Oral q1800  . chlorhexidine  15 mL Mouth Rinse BID  . Chlorhexidine Gluconate Cloth  6 each Topical Q0600  . clonazePAM  0.5 mg Oral BID  . escitalopram  10 mg Oral QHS  . furosemide  80 mg Intravenous 3 times per day  . insulin aspart  0-15 Units Subcutaneous Q4H  . insulin aspart protamine- aspart  35 Units Subcutaneous BID WC  . metoprolol tartrate  25 mg Oral BID  . mupirocin ointment  1 application Nasal BID  . nitroGLYCERIN  0.5 inch Topical 3 times per day  . pantoprazole  40 mg Oral BID   Continuous Infusions: . dextrose 5 % and 0.45% NaCl 50 mL/hr at 05/25/13 1314  . heparin 2,050 Units/hr (05/28/13 0300)    Active Problems:   DIABETES MELLITUS, TYPE I   DEPRESSION   Hypertension   OSA (obstructive sleep apnea)   Bilateral leg pain   Anemia, chronic disease   DKA (diabetic ketoacidoses)   Leukocytosis   Acute respiratory failure with hypoxia   Acute on chronic diastolic CHF (congestive heart failure), NYHA class 4   NSTEMI (non-ST elevated myocardial infarction)    Time spent: 45 minutes    Kathlen ModyVijaya Jahdai Padovano  [facility] Pager [phone] If 7PM-7AM, please contact night-coverage at [email or web address], password [email or web address] 05/28/2013, 8:32 AM  LOS: 3 days

## 2013-05-29 ENCOUNTER — Inpatient Hospital Stay (HOSPITAL_COMMUNITY)
Admit: 2013-05-29 | Discharge: 2013-05-29 | Disposition: A | Discharge status: Home / Self Care | Payer: 59 | Attending: Cardiology | Admitting: Cardiology

## 2013-05-29 ENCOUNTER — Other Ambulatory Visit (HOSPITAL_COMMUNITY): Payer: Self-pay

## 2013-05-29 ENCOUNTER — Inpatient Hospital Stay (HOSPITAL_COMMUNITY): Payer: Self-pay

## 2013-05-29 ENCOUNTER — Other Ambulatory Visit: Payer: Self-pay

## 2013-05-29 ENCOUNTER — Ambulatory Visit (HOSPITAL_COMMUNITY): Admit: 2013-05-29 | Payer: 59

## 2013-05-29 DIAGNOSIS — R079 Chest pain, unspecified: Secondary | ICD-10-CM

## 2013-05-29 LAB — GLUCOSE, CAPILLARY
GLUCOSE-CAPILLARY: 158 mg/dL — AB (ref 70–99)
Glucose-Capillary: 115 mg/dL — ABNORMAL HIGH (ref 70–99)
Glucose-Capillary: 161 mg/dL — ABNORMAL HIGH (ref 70–99)
Glucose-Capillary: 124 mg/dL — ABNORMAL HIGH (ref 70–99)
Glucose-Capillary: 171 mg/dL — ABNORMAL HIGH (ref 70–99)

## 2013-05-29 LAB — BASIC METABOLIC PANEL
BUN: 59 mg/dL — ABNORMAL HIGH (ref 6–23)
CO2: 18 mEq/L — ABNORMAL LOW (ref 19–32)
Calcium: 8.6 mg/dL (ref 8.4–10.5)
Chloride: 105 mEq/L (ref 96–112)
Creatinine, Ser: 2.18 mg/dL — ABNORMAL HIGH (ref 0.50–1.10)
GFR, EST AFRICAN AMERICAN: 31 mL/min — AB (ref >90)
GFR, EST NON AFRICAN AMERICAN: 27 mL/min — AB (ref >90)
Glucose, Bld: 136 mg/dL — ABNORMAL HIGH (ref 70–99)
POTASSIUM: 4.1 meq/L (ref 3.7–5.3)
SODIUM: 137 meq/L (ref 137–147)

## 2013-05-29 LAB — CBC
HCT: 25.1 % — ABNORMAL LOW (ref 36.0–46.0)
HEMOGLOBIN: 8.2 g/dL — AB (ref 12.0–15.0)
MCH: 28.9 pg (ref 26.0–34.0)
MCHC: 32.7 g/dL (ref 30.0–36.0)
MCV: 88.4 fL (ref 78.0–100.0)
Platelets: 280 10*3/uL (ref 150–400)
RBC: 2.84 MIL/uL — ABNORMAL LOW (ref 3.87–5.11)
RDW: 14.5 % (ref 11.5–15.5)
WBC: 9.7 10*3/uL (ref 4.0–10.5)

## 2013-05-29 LAB — PROTIME-INR
INR: 1 (ref 0.00–1.49)
Prothrombin Time: 13 seconds (ref 11.6–15.2)

## 2013-05-29 LAB — HEPARIN LEVEL (UNFRACTIONATED): HEPARIN UNFRACTIONATED: 0.38 [IU]/mL (ref 0.30–0.70)

## 2013-05-29 MED ORDER — INSULIN GLARGINE 100 UNIT/ML ~~LOC~~ SOLN
40.0000 [IU] | Freq: Every day | SUBCUTANEOUS | Status: DC
  Administered 2013-05-29 – 2013-05-30 (×2): 40 [IU] via SUBCUTANEOUS
  Filled 2013-05-29 (×2): qty 0.4

## 2013-05-29 MED ORDER — TECHNETIUM TC 99M SESTAMIBI GENERIC - CARDIOLITE
30.0000 | Freq: Once | INTRAVENOUS | Status: AC | PRN
  Administered 2013-05-29: 30 via INTRAVENOUS

## 2013-05-29 MED ORDER — REGADENOSON 0.4 MG/5ML IV SOLN
0.4000 mg | Freq: Once | INTRAVENOUS | Status: AC
  Administered 2013-05-29: 0.4 mg via INTRAVENOUS

## 2013-05-29 MED ORDER — REGADENOSON 0.4 MG/5ML IV SOLN
INTRAVENOUS | Status: AC
  Administered 2013-05-29: 0.4 mg via INTRAVENOUS
  Filled 2013-05-29: qty 5

## 2013-05-29 MED ORDER — TECHNETIUM TC 99M SESTAMIBI GENERIC - CARDIOLITE
10.0000 | Freq: Once | INTRAVENOUS | Status: AC | PRN
Start: 2013-05-29 — End: 2013-05-29
  Administered 2013-05-29: 10 via INTRAVENOUS

## 2013-05-29 NOTE — Progress Notes (Signed)
ANTICOAGULATION CONSULT NOTE - Follow Up Consult  Pharmacy Consult for Heparin Indication: NSTEMI  Allergies  Allergen Reactions  . Contrast Media [Iodinated Diagnostic Agents] Nausea And Vomiting  . Paroxetine Nausea And Vomiting  . Oxycodone Itching and Rash    Patient Measurements: Height: 5\' 7"  (170.2 cm) Weight: 266 lb 12.1 oz (121 kg) IBW/kg (Calculated) : 61.6 Heparin Dosing Weight: 92kg  Vital Signs: Temp: 98 F (36.7 C) (05/05 0308) Temp src: Oral (05/04 2200) BP: 149/74 mmHg (05/05 0700) Pulse Rate: 70 (05/05 0700)  Labs:  Recent Labs  05/26/13 1511 05/26/13 1635 05/26/13 2037  05/27/13 0310 05/27/13 0930  05/28/13 0110 05/28/13 0951 05/28/13 1606 05/29/13 0305  HGB  --   --   --   < > 8.3*  --   --  8.1*  --   --  8.2*  HCT  --   --   --   --  25.1*  --   --  24.9*  --   --  25.1*  PLT  --   --   --   --  267  --   --  267  --   --  280  APTT  --  29  --   --   --   --   --   --   --   --   --   LABPROT  --   --   --   --  14.6  --   --  14.1  --   --  13.0  INR  --   --   --   --  1.16  --   --  1.11  --   --  1.00  HEPARINUNFRC  --   --   --   < >  --  0.23*  < > 0.21* 0.40 0.37 0.38  CREATININE 2.09*  --  2.11*  --  2.14* 2.10*  --  2.23*  --   --  2.18*  TROPONINI 8.07*  --  7.47*  --  6.28*  --   --   --   --   --   --   < > = values in this interval not displayed.  Estimated Creatinine Clearance: 45.3 ml/min (by C-G formula based on Cr of 2.18).   Medications:  Infusions:  . heparin 2,050 Units/hr (05/28/13 2229)    Assessment: [age] yoF admitted 5/1 with shoulder pain x2 days, and found to be in DKA with acute on chronic diastolic CHF, and NSTEMI.  Pharmacy is consulted to dose IV Heparin on 5/2.   Heparin Level: 0.38, therapeutic  CBC: Hgb 8.2, Plt 280  SCr decreased to 2.18, CrCl ~ 45 ml/min  Troponins: 8.07, 7.47, 6.28 (5/3)  No bleeding or infusion complications reported.  Goal of Therapy:  Heparin level 0.3-0.7  units/ml Monitor platelets by anticoagulation protocol: Yes   Plan:   Continue heparin IV infusion at 2050 units/hr (20.5 ml/hr)  Daily heparin level and CBC  Continue to monitor H&H and platelets   Lynann Beaverhristine Arlyn Buerkle PharmD, BCPS Pager [phone] 05/29/2013 7:58 AM

## 2013-05-29 NOTE — Progress Notes (Signed)
Pt [facility] on CP[facility]10[facility] per home settings via nasal mask.  Pt tolerating well at this time, RT to monitor and assess as needed.

## 2013-05-29 NOTE — Progress Notes (Signed)
Agree with the previous RN's assessment. Will continue to monitor patient. Nina Hoar M Setzer  

## 2013-05-29 NOTE — Progress Notes (Signed)
Nuclear stress test reviewed. Fixed anteroapical defect. No ischemia. No anginal symptoms. Echocardiogram ejection fraction normal. No further cardiac testing at this time. Continue with aggressive medical management. I will discontinue heparin at this time. Possible discharge tomorrow.  Donato SchultzMark Asmar Brozek, MD

## 2013-05-29 NOTE — Progress Notes (Signed)
[facility] PROGRESS NOTE  Shefali L Tavenner ZOX:[id number] DOB: 1970-07-03 DOA: 05/25/2013 PCP: Dois DavenportICHTER,KAREN L., MD Interim summary: Spruha L Archie PattenMonroe is a 43 y.o. female with prior h/o hypertension, chronic diastolic heart failure, sees Dr Jones BroomBensihmon as outpatient, OSA, DM type 1, asthma, obesity, stage 3 CKD, diabetic gastroparesis, non obstructive CAD, with h/o intubation for acute respiratory failure in February, comes in today for worsening left shoulder pain and left leg pain since 2 days. She also reports worsening sob over the last one week. She was seen at the office int he last one week and was given steroids. She reports her breathing hasnot improved, she also reports dry cough . She denies any other complaints. On arrival to the ED, she was found in diabetic ketoacidosis, hypoxic, hyponatremic, hyperkalemic. Currently she is on 4 lit [place] oxygen with sats in low 90's. Her CXR revealed mild interstitial edema. She was found to have elevated d dimer, and is awaiting V/Q SCAN for evaluation of PE. Her vascular studies of the left shoulder and and left LE negative for DVT. She is then referred to medical service for admission. She was on IV insulin and later transitioned to sq novolog 70/30 and her cbg's have been well controlled.  On Day 2 pt had 2 episodes of panic attacks, when she reports unable to breath, profuse sweating, associated withtachycardia, tachypnic, and hypoxic, usually resolved with bronchodilators and venti mask oxygen. We obtained a CXR showing fulminant pulmonary edema, increased her lasix to 80 mg TID. Her serial troponins peaked at 8, at which time she was started on IV heparin and cardiology consulted. She underwent cardiac cath in 2010 which showed mild disease,  non obstructive CAD (30% LAD proximal, mid and distal lesions, 60% diagonal, 30% Dominant mid circumflex. Meanwhile she never actually complained of any chest pain or chest tightness during the hospitalization so far. She  underwent [facility]an [facility] , showed no ischemia and LVEF of 47%. Heparin was discontinued by cardiology.    Assessment/Plan:  Acute respiratory failure with hypoxia: Secondary to acute on chronic diastolic heart failure  Oxygen to keep sat's>90%. On cpap at night. She had respiratory distress earlier today and repeat CXR showed fulminant pulmonary edema. Ordered one dose of 80 MG IV lasix, followed by 80 mg every 8 hours. Bronchodilators PRN.  Repeat Echocardiogram ordered. Serial troponin's. Cardiology consulted for further recommendations.   Repeat CXR showed mild improvement in the pulmonary edema. Cardiology plans to do a lexiscan. We wil wait recommendations.  Echocardiogram showed preserved LVEF and grade 2 diastolic dysfunction. Regional wall motion abn cannot be excluded from the study.   She underwent stress test on 5/5 .    Diabetic Ketoacidosis:  Admitted to step down. Started on IV glucostabilizer. Transitioned to lantus and SSI. Marland Kitchen. So far we could not find a source of infection.  CBG (last 3)   Recent Labs  05/29/13 0823 05/29/13 1450 05/29/13 1647  GLUCAP 161* 124* 171*   NSTEMI: - currently on aspirin, b blocker, statin and heparin IV.  - stress test on 5/5 and showed no reversible ischemia. Heparin discontinues.  She currently denies any chest pain or sob.  Echo done and EKG unchanged from previous EKG's   Panic attacks/ ? Vocal cord dysfunction: - patient had 3 to4  Episodes of panic attacks during the weekend with unable to breathe, profuse sweating and hypoxia. Lasts from half n hour to a couple of hours. Usually resolves with bronchodilators and oxygen.  -  psychiatry consulted for medication assistance, if they are actually panic attacks.    Hyponatremia: A combination of pseudohyponatremia from hyperglycemia and fluid overload.  Repeat level ordered and improved.  Hyperkalemia;  Resolved.    CKD stage 3;  - her creatinine is  2.23, slightly worse than on admission, probably from the diuretics.  - continue to monitor. Will call renal assistance if she needs a catheterization.   Leukocytosis:  - unclear etiology.  - she is afebrile.  -? Reactive. Improving.   Anemia:  - appears to be at baseline.  - secondary to anemia of chronic disease.   Elevated D dimer:  - V/Q scan negative or low prob for PE.   Left shoulder pain:  - probably musculoskeletal.  - left arm venous duplex neg for DVT.  - if the pain doesn 't improve will scan with CT.   DVT prophylaxis    Code Status: full code  Family Communication: none at bedside  Disposition Plan: transfer to telemetry.         Consultants:  Cardiology  psychiatry  Procedures:  NM SCAN- low probability of pulmonary embolus  CXR vascular congestion.   Antibiotics:  none  HPI/Subjective: No panic attacks today, more comfortable.   Objective: Filed Vitals:   05/29/13 1618  BP: 156/79  Pulse: 86  Temp: 98.5 F (36.9 C)  Resp: 20    Intake/Output Summary (Last 24 hours) at 05/29/13 1829 Last data filed at 05/29/13 1500  Gross per 24 hour  Intake    568 ml  Output   4075 ml  Net  -3507 ml   Filed Weights   05/28/13 0400 05/29/13 0600 05/29/13 1618  Weight: 121.6 kg (268 lb 1.3 oz) 121 kg (266 lb 12.1 oz) 121 kg (266 lb 12.1 oz)    Exam:   General:  Alert , afebrile comfortable.   Cardiovascular: s1s2,  Respiratory: CTAB, no wheezing or rhonchi.   Abdomen: soft NT ND BS+  Musculoskeletal: trace pedal edema.   Data Reviewed: Basic Metabolic Panel:  Recent Labs Lab 05/26/13 2037 05/27/13 0310 05/27/13 0930 05/28/13 0110 05/29/13 0305  NA 140 138 137 137 137  K 4.6 4.3 4.4 4.3 4.1  CL 107 106 104 105 105  CO2 14* 17* 17* 18* 18*  GLUCOSE 132* 188* 152* 147* 136*  BUN 67* 63* 62* 61* 59*  CREATININE 2.11* 2.14* 2.10* 2.23* 2.18*  CALCIUM 9.2 8.7 8.9 8.8 8.6   Liver Function Tests:  Recent Labs Lab  05/25/13 0545  AST 12  ALT 14  ALKPHOS 105  BILITOT 0.4  PROT 8.1  ALBUMIN 3.7   No results found for this basename: LIPASE, AMYLASE,  in the last 168 hours No results found for this basename: AMMONIA,  in the last 168 hours CBC:  Recent Labs Lab 05/25/13 0545 05/26/13 0340 05/27/13 0310 05/28/13 0110 05/29/13 0305  WBC 17.3* 20.5* 13.1* 10.6* 9.7  NEUTROABS  --  17.7* 10.2*  --   --   HGB 9.9* 9.2* 8.3* 8.1* 8.2*  HCT 31.2* 27.8* 25.1* 24.9* 25.1*  MCV 90.2 87.1 87.5 88.9 88.4  PLT 342 276 267 267 280   Cardiac Enzymes:  Recent Labs Lab 05/26/13 1511 05/26/13 2037 05/27/13 0310  TROPONINI 8.07* 7.47* 6.28*   BNP (last 3 results)  Recent Labs  11/01/12 1446 03/24/13 1200 05/25/13 0545  PROBNP 2441.0* 6479.0* 3258.0*   CBG:  Recent Labs Lab 05/28/13 1654 05/28/13 2154 05/29/13 0823 05/29/13 1450 05/29/13 1647  GLUCAP 173* 115* 161* 124* 171*    Recent Results (from the past 240 hour(s))  MRSA PCR SCREENING     Status: Abnormal   Collection Time    05/25/13  3:34 PM      Result Value Ref Range Status   MRSA by PCR POSITIVE (*) NEGATIVE Final   Comment:            The GeneXpert MRSA Assay (FDA     approved for NASAL specimens     only), is one component of a     comprehensive MRSA colonization     surveillance program. It is not     intended to diagnose MRSA     infection nor to guide or     monitor treatment for     MRSA infections.     RESULT CALLED TO, READ BACK BY AND VERIFIED WITH:     A.MABRY RN AT 1732 ON [id number] BY C.BONGEL     Studies: Nm Myocar Multi W/spect W/wall Motion / Ef  05/29/2013   EXAM: MYOCARDIAL IMAGING WITH SPECT (REST AND PHARMACOLOGIC-STRESS)  GATED LEFT VENTRICULAR WALL MOTION STUDY  LEFT VENTRICULAR EJECTION FRACTION  TECHNIQUE: Standard myocardial SPECT imaging was performed after resting intravenous injection of 10 mCi Tc-32m sestamibi. Subsequently, intravenous infusion of Lexiscan was performed under the  supervision of the Cardiology staff. At peak effect of the drug, 30 mCi Tc-64m sestamibi was injected intravenously and standard myocardial SPECT imaging was performed. Quantitative gated imaging was also performed to evaluate left ventricular wall motion, and estimate left ventricular ejection fraction.  COMPARISON:  None.  FINDINGS: The patient underwent a Lexiscan Myoviewunder the supervision of the [employer]. The EKG at rest shows poor R-wave progression in V1 through V3. There are no ischemic changes at rest. With stress, there was no change in the EKG. The patient experienced dyspnea but no chest discomfort during infusion of Lexiscan.  The quality of the images is satisfactory. There is a medium-sized fixed perfusion defect involving the apex and anteroapical segments. This is consistent with a scar. There is no significant reversible ischemia.  The end diastolic volume is 123 ML. The end systolic volume is 65 ML. The left ventricular ejection fraction is 47%. There is mild apical hypokinesis.  IMPRESSION:  Abnormal LexiScan Myoviewdemonstrating a fixed anteroapical scar of medium size. There is no reversible ischemia. There is mild left ventricular dysfunction with mild apical hypokinesis.   Electronically Signed   By: Cassell Clement M.D.   On: 05/29/2013 15:48    Scheduled Meds: . amLODipine  10 mg Oral Daily  . antiseptic oral rinse  15 mL Mouth Rinse q12n4p  . aspirin  81 mg Oral Daily  . atorvastatin  80 mg Oral q1800  . chlorhexidine  15 mL Mouth Rinse BID  . Chlorhexidine Gluconate Cloth  6 each Topical Q0600  . clonazePAM  0.5 mg Oral BID  . escitalopram  10 mg Oral QHS  . furosemide  80 mg Intravenous 3 times per day  . insulin aspart  0-15 Units Subcutaneous TID WC  . insulin glargine  40 Units Subcutaneous Daily  . metoprolol tartrate  25 mg Oral BID  . mupirocin ointment  1 application Nasal BID  . pantoprazole  40 mg Oral BID   Continuous Infusions:    Active  Problems:   DIABETES MELLITUS, TYPE I   DEPRESSION   Hypertension   OSA (obstructive sleep apnea)   Bilateral leg pain   Anemia,  chronic disease   DKA (diabetic ketoacidoses)   Leukocytosis   Acute respiratory failure with hypoxia   Acute on chronic diastolic CHF (congestive heart failure), NYHA class 4   NSTEMI (non-ST elevated myocardial infarction)    Time spent: 35 minutes    Kathlen ModyVijaya Menachem Urbanek  [facility] Pager [phone] If 7PM-7AM, please contact night-coverage at [email or web address], password [email or web address] 05/29/2013, 6:29 PM  LOS: 4 days

## 2013-05-29 NOTE — Progress Notes (Signed)
Lexiscan myovue complete.  Jasmine FinlayBryan Londen [name], [facility]

## 2013-05-29 NOTE — Progress Notes (Addendum)
    Primary Cardiologist Dr. Gala RomneyBensimhon  Subjective:  Feels better, no CP. No SOB.   Objective:  Vital Signs in the last 24 hours: Temp:  [98 F (36.7 C)-98.8 F (37.1 C)] 98 F (36.7 C) (05/05 0308) Pulse Rate:  [70-90] 70 (05/05 0700) Resp:  [18-26] 22 (05/05 0700) BP: (131-175)/(48-75) 149/74 mmHg (05/05 0700) SpO2:  [98 %-100 %] 100 % (05/05 0700) Weight:  [266 lb 12.1 oz (121 kg)] 266 lb 12.1 oz (121 kg) (05/05 0600)  Intake/Output from previous day: 05/04 0701 - 05/05 0700 In: 691 [P.O.:240; I.V.:451] Out: 3825 [Urine:3825]   Physical Exam: General: Well developed, well nourished, in no acute distress. Head:  Normocephalic and atraumatic. Lungs: Clear to auscultation and percussion. Heart: Normal S1 and S2.  No murmur, rubs or gallops.  Abdomen: soft, non-tender, positive bowel sounds. Overweight.  Extremities: No clubbing or cyanosis. No edema. Neurologic: Alert and oriented x 3.    Lab Results:  Recent Labs  05/28/13 0110 05/29/13 0305  WBC 10.6* 9.7  HGB 8.1* 8.2*  PLT 267 280    Recent Labs  05/28/13 0110 05/29/13 0305  NA 137 137  K 4.3 4.1  CL 105 105  CO2 18* 18*  GLUCOSE 147* 136*  BUN 61* 59*  CREATININE 2.23* 2.18*    Recent Labs  05/26/13 2037 05/27/13 0310  TROPONINI 7.47* 6.28*   Imaging: Dg Chest Port 1 View  05/27/2013   CLINICAL DATA:  Followup pulmonary edema  EXAM: PORTABLE CHEST - 1 VIEW  COMPARISON:  05/26/2013  FINDINGS: Pulmonary edema has mildly improved.  No new lung opacities.  No convincing pleural effusion.  No pneumothorax.  Cardiac silhouette is normal in size.  IMPRESSION: Mild improvement in pulmonary edema.   Electronically Signed   By: Amie Portlandavid  Ormond M.D.   On: 05/27/2013 14:28    Telemetry: NO VT, no adverse rhythms. Personally viewed.   EKG:  NSR, PRWP, NSSTW changes.   Cardiac Studies:  ECHO - EF 55%, moderate LVH NUC pending  Assessment/Plan:  Active Problems:   DIABETES MELLITUS, TYPE I  DEPRESSION   Hypertension   OSA (obstructive sleep apnea)   Bilateral leg pain   Anemia, chronic disease   DKA (diabetic ketoacidoses)   Leukocytosis   Acute respiratory failure with hypoxia   Acute on chronic diastolic CHF (congestive heart failure), NYHA class 4   NSTEMI (non-ST elevated myocardial infarction)  43 year old with NSTEMI, CKD.   1) NSTEMI  - Await Lexiscan NUC  - Cath 2010 - non obstructive CAD (30% LAD proximal, mid and distal lesions, 60% diagonal, 30% Dominant mid circumflex)  - no angina  - V/Q negative  - ?DKA contributing.   - Trop peaked 8, normal EF  - If the perfusion pattern is normal, forego cardiac cath and treat medically for CAD and diastolic dysfunction (concern over AKI with contrast)  - If NUC low risk, stop heparin.   2) Obesity   - encourage weight loss  3) HTN  - meds reviewed  4) DKA   - improved.   5) Anemia  - at baseline  6) Acute on chronic diastolic HF  - appears euvolemic    Donato SchultzMark Murray Durrell 05/29/2013, 8:06 AM

## 2013-05-29 NOTE — Progress Notes (Signed)
Clinical Social Work  CSW met with patient at bedside. Patient reports she slept well last night and is feeling better. Patient went on [facility] to [facility] for test but reports that anxiety has been managed and felt well during transport. CSW explained that CSW spoke with Dr. Starleen Arms office and she does accept patient's insurance. Patient reports she will follow up with outpatient psychiatrist at [facility].   Patient wanted to order lunch since she had been at [facility]. CSW agreeable to follow up tomorrow.  [place], [place] [phone]

## 2013-05-29 NOTE — Progress Notes (Addendum)
[facility].  No change in patient assessment.  Pt aaox3.  Pt has no complaint at this time.  Pt cbg 124.  Pt sitting up in chair.

## 2013-05-30 ENCOUNTER — Other Ambulatory Visit (HOSPITAL_COMMUNITY): Payer: Self-pay

## 2013-05-30 DIAGNOSIS — R109 Unspecified abdominal pain: Secondary | ICD-10-CM

## 2013-05-30 LAB — CBC
HEMATOCRIT: 29 % — AB (ref 36.0–46.0)
HEMOGLOBIN: 9.3 g/dL — AB (ref 12.0–15.0)
MCH: 28.1 pg (ref 26.0–34.0)
MCHC: 32.1 g/dL (ref 30.0–36.0)
MCV: 87.6 fL (ref 78.0–100.0)
PLATELETS: 317 10*3/uL (ref 150–400)
RBC: 3.31 MIL/uL — AB (ref 3.87–5.11)
RDW: 14.3 % (ref 11.5–15.5)
WBC: 10.2 10*3/uL (ref 4.0–10.5)

## 2013-05-30 LAB — HEPARIN LEVEL (UNFRACTIONATED): Heparin Unfractionated: <0.1 IU/mL — ABNORMAL LOW (ref 0.30–0.70)

## 2013-05-30 LAB — BASIC METABOLIC PANEL
BUN: 57 mg/dL — AB (ref 6–23)
CHLORIDE: 103 meq/L (ref 96–112)
CO2: 18 meq/L — AB (ref 19–32)
CREATININE: 2.07 mg/dL — AB (ref 0.50–1.10)
Calcium: 9.1 mg/dL (ref 8.4–10.5)
GFR calc Af Amer: 33 mL/min — ABNORMAL LOW (ref >90)
GFR calc non Af Amer: 28 mL/min — ABNORMAL LOW (ref >90)
GLUCOSE: 142 mg/dL — AB (ref 70–99)
Potassium: 3.9 mEq/L (ref 3.7–5.3)
Sodium: 135 mEq/L — ABNORMAL LOW (ref 137–147)

## 2013-05-30 LAB — PROTIME-INR
INR: 1.04 (ref 0.00–1.49)
Prothrombin Time: 13.4 seconds (ref 11.6–15.2)

## 2013-05-30 LAB — GLUCOSE, CAPILLARY: GLUCOSE-CAPILLARY: 156 mg/dL — AB (ref 70–99)

## 2013-05-30 LAB — PROCALCITONIN: Procalcitonin: 0.26 ng/mL

## 2013-05-30 MED ORDER — FUROSEMIDE 80 MG PO TABS
80.0000 mg | ORAL_TABLET | Freq: Two times a day (BID) | ORAL | Status: DC
  Administered 2013-05-30: 80 mg via ORAL
  Filled 2013-05-30 (×3): qty 1

## 2013-05-30 MED ORDER — ATORVASTATIN CALCIUM 80 MG PO TABS: 80.0000 mg | ORAL_TABLET | Freq: Every day | ORAL | Status: AC

## 2013-05-30 MED ORDER — CLONAZEPAM 0.5 MG PO TABS: 0.5000 mg | ORAL_TABLET | Freq: Two times a day (BID) | ORAL | Status: DC

## 2013-05-30 MED ORDER — CLOPIDOGREL BISULFATE 75 MG PO TABS: 75.0000 mg | ORAL_TABLET | Freq: Every day | ORAL | Status: DC

## 2013-05-30 MED ORDER — CLOPIDOGREL BISULFATE 75 MG PO TABS
75.0000 mg | ORAL_TABLET | Freq: Every day | ORAL | Status: DC
  Administered 2013-05-30: 75 mg via ORAL
  Filled 2013-05-30 (×2): qty 1

## 2013-05-30 MED ORDER — METOPROLOL TARTRATE 25 MG PO TABS: 25.0000 mg | ORAL_TABLET | Freq: Two times a day (BID) | ORAL | Status: DC

## 2013-05-30 MED ORDER — ESCITALOPRAM OXALATE 10 MG PO TABS: 10.0000 mg | ORAL_TABLET | Freq: Every day | ORAL | Status: DC

## 2013-05-30 NOTE — Progress Notes (Signed)
PT Cancellation Note  Patient Details Name: Jasmine [name] MRN: [id number] DOB: 22-Apr-1970   Cancelled Treatment:    Reason Eval/Treat Not Completed: PT screened, no needs identified, will sign off Order received.  Per RN pt to d/c home today and does not have PT needs.  PT to sign off.   Jasmine [name] 05/30/2013, 11:31 AM Jasmine [name], PT, DPT 05/30/2013 Pager: [phone]

## 2013-05-30 NOTE — Progress Notes (Signed)
Primary Cardiologist Dr. Gala RomneyBensimhon  Subjective:  Feels better, no CP. No SOB. Stress test reviewed with her. CPAP overnight  Objective:  Vital Signs in the last 24 hours: Temp:  [97.5 F (36.4 C)-98.7 F (37.1 C)] 98.5 F (36.9 C) (05/06 0650) Pulse Rate:  [71-86] 83 (05/06 0650) Resp:  [15-25] 21 (05/06 0650) BP: (134-178)/(59-79) 142/68 mmHg (05/06 0650) SpO2:  [98 %-100 %] 100 % (05/06 0650) Weight:  [256 lb 2.8 oz (116.2 kg)-266 lb 12.1 oz (121 kg)] 256 lb 2.8 oz (116.2 kg) (05/06 0650)  Intake/Output from previous day: 05/05 0701 - 05/06 0700 In: 682 [P.O.:600; I.V.:82] Out: 5050 [Urine:5050]   Physical Exam: General: Well developed, well nourished, in no acute distress. Head:  Normocephalic and atraumatic. Lungs: Clear to auscultation and percussion. Heart: Normal S1 and S2.  No murmur, rubs or gallops.  Abdomen: soft, non-tender, positive bowel sounds. Overweight.  Extremities: No clubbing or cyanosis. No edema. Neurologic: Alert and oriented x 3.    Lab Results:  Recent Labs  05/29/13 0305 05/30/13 0350  WBC 9.7 10.2  HGB 8.2* 9.3*  PLT 280 317    Recent Labs  05/29/13 0305 05/30/13 0350  NA 137 135*  K 4.1 3.9  CL 105 103  CO2 18* 18*  GLUCOSE 136* 142*  BUN 59* 57*  CREATININE 2.18* 2.07*    Telemetry: NO VT, no adverse rhythms. Personally viewed.   EKG:  NSR, PRWP, NSSTW changes.   Cardiac Studies:  ECHO - EF 55%, moderate LVH NUC - fixed anteroapical defect, EF 47%. No ischemia.   Marland Kitchen. amLODipine  10 mg Oral Daily  . antiseptic oral rinse  15 mL Mouth Rinse q12n4p  . aspirin  81 mg Oral Daily  . atorvastatin  80 mg Oral q1800  . chlorhexidine  15 mL Mouth Rinse BID  . Chlorhexidine Gluconate Cloth  6 each Topical Q0600  . clonazePAM  0.5 mg Oral BID  . escitalopram  10 mg Oral QHS  . furosemide  80 mg Oral BID  . insulin aspart  0-15 Units Subcutaneous TID WC  . insulin glargine  40 Units Subcutaneous Daily  . metoprolol  tartrate  25 mg Oral BID  . mupirocin ointment  1 application Nasal BID  . pantoprazole  40 mg Oral BID   Assessment/Plan:  Active Problems:   DIABETES MELLITUS, TYPE I   DEPRESSION   Hypertension   OSA (obstructive sleep apnea)   Bilateral leg pain   Anemia, chronic disease   DKA (diabetic ketoacidoses)   Leukocytosis   Acute respiratory failure with hypoxia   Acute on chronic diastolic CHF (congestive heart failure), NYHA class 4   NSTEMI (non-ST elevated myocardial infarction)  43 year old with NSTEMI, CKD 4, acute on chronic diastolic HF.   1) NSTEMI  - NUC stress showed fixed anteroapical defect. No ischemia. No further cardiac workup. Aggressive medical management. EF on ECHO normal.   - Cath 2010 - non obstructive CAD (30% LAD proximal, mid and distal lesions, 60% diagonal, 30% Dominant mid circumflex)  - no angina  - V/Q negative  - ?DKA contributing.   - Trop peaked 8, normal EF  - (concern over AKI with contrast with cath)  - Will add clopidogrel to take for at least one year with NSTEMI. Monitor for signs of worsening anemia.    2) Obesity   - encourage weight loss  3) HTN  - meds reviewed  4) DKA   -  resolved   5) Anemia  - at baseline  6) Acute on chronic diastolic HF  - appears euvolemic  - -13.4 Liters on admit  - Weight 256 pounds.   - Changed lasix from IV to PO home dose of 80 BID.  - discussed fluid and salt restrictions  OK with DC home. Will have set up with Dr. Gala RomneyBensimhon in CHF clinic in 7-14 days.     Ivory Bail 05/30/2013, 7:07 AM

## 2013-05-30 NOTE — Discharge Summary (Signed)
Physician Discharge Summary  Jasmine [name] AVW:[id number] DOB: October 29, 1970 DOA: 05/25/2013  PCP: Dois Davenport., MD  Admit date: 05/25/2013 Discharge date: 05/30/2013  Time spent: 45 minutes  Recommendations for Outpatient Follow-up:  -Will be discharged home today. -Advised to follow up with PCP in 2 weeks for her diabetes. -Already has follow up with cardiology scheduled for 5/11.   Discharge Diagnoses:  Active Problems:   DIABETES MELLITUS, TYPE I   DEPRESSION   Hypertension   OSA (obstructive sleep apnea)   Bilateral leg pain   Anemia, chronic disease   DKA (diabetic ketoacidoses)   Leukocytosis   Acute respiratory failure with hypoxia   Acute on chronic diastolic CHF (congestive heart failure), NYHA class 4   NSTEMI (non-ST elevated myocardial infarction)   Discharge Condition: Stable and improved  Filed Weights   05/29/13 0600 05/29/13 1618 05/30/13 0650  Weight: 121 kg (266 lb 12.1 oz) 121 kg (266 lb 12.1 oz) 116.2 kg (256 lb 2.8 oz)    History of present illness:  43 y.o. female with prior h/o hypertension, chronic diastolic heart failure, sees Dr Jones Broom as outpatient, OSA, DM type 1, asthma, obesity, stage 3 CKD, diabetic gastroparesis, non obstructive CAD, with h/o intubation for acute respiratory failure in February, comes in today for worsening left shoulder pain and left leg pain since 2 days. She also reports worsening sob over the last one week. She was seen at the office int he last one week and was given steroids. She reports her breathing hasnot improved, she also reports dry cough . She denies any other complaints. On arrival to the ED, she was found in diabetic ketoacidosis, hypoxic, hyponatremic, hyperkalemic. Currently she is on 4 lit [place] oxygen with sats in low 90's. Her CXR revealed mild interstitial edema. She was found to have elevated d dimer, and is awaiting V/Q SCAN for evaluation of PE. Her vascular studies of the left shoulder and and left LE  negative for DVT. She is then referred to medical service for admission. She is currently on IV glucostabilizer for DKA.    Hospital Course:   Acute respiratory failure with hypoxia:  Secondary to acute on chronic diastolic heart failure  Oxygen to keep sat's>90%. On cpap at night. She had respiratory distress earlier today and repeat CXR showed fulminant pulmonary edema. Ordered one dose of 80 MG IV lasix, followed by 80 mg every 8 hours. Bronchodilators PRN.  Repeat Echocardiogram ordered. Serial troponin's. Cardiology consulted for further recommendations.  Repeat CXR showed mild improvement in the pulmonary edema. Cardiology plans to do a lexiscan. We wil wait recommendations.  Echocardiogram showed preserved LVEF and grade 2 diastolic dysfunction. Regional wall motion abn cannot be excluded from the study.  She underwent stress test on 5/5 .   Diabetic Ketoacidosis:  Admitted to step down. Started on IV glucostabilizer. Transitioned to lSQ insulin. Advised to bring CBG log to follow up appointment with PCP.  NSTEMI:  - NUC stress showed fixed anteroapical defect. No ischemia. No further cardiac workup. Aggressive medical management. EF on ECHO normal.  - Cath 2010 - non obstructive CAD (30% LAD proximal, mid and distal lesions, 60% diagonal, 30% Dominant mid circumflex)  - no angina  - V/Q negative  - ?DKA contributing.  - Trop peaked 8, normal EF  - Cards has added clopidogrel to take for at least one year with NSTEMI. Monitor for signs of worsening anemia.   Panic attacks/ ? Vocal cord dysfunction:  - patient had  3 to4 Episodes of panic attacks during the weekend with unable to breathe, profuse sweating and hypoxia. Lasts from half n hour to a couple of hours. Usually resolves with bronchodilators and oxygen.  - psychiatry consulted for medication assistance, and recommended lexapro and klonopin.  Hyponatremia:  A combination of pseudohyponatremia from hyperglycemia and fluid  overload.  Repeat level ordered and improved.   Hyperkalemia;  Resolved.   CKD stage 3;  - her creatinine is 2.07 on DC, which is her baseline.   Anemia:  - appears to be at baseline.  - secondary to anemia of chronic disease.    Procedures:  None   Consultations:  Cards  Psych  Discharge Instructions  Discharge Orders   Future Appointments Provider Department Dept Phone   06/04/2013 2:00 PM [facility] [phone]   Future Orders Complete By Expires   Diet - low sodium heart healthy  As directed    Discontinue IV  As directed    Increase activity slowly  As directed        Medication List         acetaminophen 500 MG tablet  Commonly known as:  TYLENOL  Take 500 mg by mouth every 6 (six) hours as needed for mild pain.     albuterol 108 (90 BASE) MCG/ACT inhaler  Commonly known as:  PROVENTIL HFA;VENTOLIN HFA  Inhale 2 puffs into the lungs every 6 (six) hours as needed for wheezing.     amLODipine 10 MG tablet  Commonly known as:  NORVASC  Take 10 mg by mouth daily.     aspirin 81 MG tablet  Take 81 mg by mouth daily as needed. For pain     atorvastatin 80 MG tablet  Commonly known as:  LIPITOR  Take 1 tablet (80 mg total) by mouth daily at 6 PM.     clonazePAM 0.5 MG tablet  Commonly known as:  KLONOPIN  Take 1 tablet (0.5 mg total) by mouth 2 (two) times daily.     clopidogrel 75 MG tablet  Commonly known as:  PLAVIX  Take 1 tablet (75 mg total) by mouth daily with breakfast.     escitalopram 10 MG tablet  Commonly known as:  LEXAPRO  Take 1 tablet (10 mg total) by mouth at bedtime.     ferrous sulfate 325 (65 FE) MG tablet  Take 325 mg by mouth 2 (two) times daily.     furosemide 80 MG tablet  Commonly known as:  LASIX  Take 80 mg by mouth 2 (two) times daily.     HYDROcodone-acetaminophen 5-325 MG per tablet  Commonly known as:  NORCO  Take 1 tablet by mouth every 6 (six) hours  as needed for moderate pain.     insulin NPH-regular Human (70-30) 100 UNIT/ML injection  Commonly known as:  NOVOLIN 70/30  Inject 35-40 Units into the skin 2 (two) times daily with a meal. 40 in the morning and 35 in the evening     metoprolol tartrate 25 MG tablet  Commonly known as:  LOPRESSOR  Take 1 tablet (25 mg total) by mouth 2 (two) times daily.     multivitamin with minerals Tabs tablet  Take 1 tablet by mouth daily.       Allergies  Allergen Reactions  . Contrast Media [Iodinated Diagnostic Agents] Nausea And Vomiting  . Paroxetine Nausea And Vomiting  . Oxycodone Itching and Rash  Follow-up Information   Call [facility]. (To schedule an appointment to meet with a psychiatrist)    Contact information:   [address] [place] [address], [place], [place] [address] [phone]      Follow up with Arvilla Meresaniel [name], MD On 06/04/2013. (2 pm)    Specialty:  Cardiology   Contact information:   [address] [place] [address] [phone]       Follow up with Dois DavenportICHTER,KAREN L., MD. Schedule an appointment as soon as possible for a visit in 2 weeks.   Specialty:  Family Medicine   Contact information:   [address] [place] [id number] [phone]        The results of significant diagnostics from this hospitalization (including imaging, microbiology, ancillary and laboratory) are listed below for reference.    Significant Diagnostic Studies: Dg Chest 2 View  05/25/2013   CLINICAL DATA:  Cough and severe shortness of breath. History of asthma.  EXAM: CHEST  2 VIEW  COMPARISON:  Chest radiograph from 04/04/2013  FINDINGS: The lungs are well-aerated. Vascular congestion is noted. Mildly increased interstitial markings could reflect mild interstitial edema. Trace fluid is seen tracking along the major fissures. There is no evidence of pneumothorax.  The heart is borderline enlarged. Mild hilar prominence  is nonspecific. No acute osseous abnormalities are seen.  IMPRESSION: Vascular congestion and borderline cardiomegaly; mildly increased interstitial markings could reflect mild interstitial edema.   Electronically Signed   By: Roanna RaiderJeffery  Chang M.D.   On: 05/25/2013 06:31   Nm Myocar Multi W/spect W/wall Motion / Ef  05/29/2013   EXAM: MYOCARDIAL IMAGING WITH SPECT (REST AND PHARMACOLOGIC-STRESS)  GATED LEFT VENTRICULAR WALL MOTION STUDY  LEFT VENTRICULAR EJECTION FRACTION  TECHNIQUE: Standard myocardial SPECT imaging was performed after resting intravenous injection of 10 mCi Tc-6947m sestamibi. Subsequently, intravenous infusion of Lexiscan was performed under the supervision of the Cardiology staff. At peak effect of the drug, 30 mCi Tc-3547m sestamibi was injected intravenously and standard myocardial SPECT imaging was performed. Quantitative gated imaging was also performed to evaluate left ventricular wall motion, and estimate left ventricular ejection fraction.  COMPARISON:  None.  FINDINGS: The patient underwent a Lexiscan Myoviewunder the supervision of the [facility] staff. The EKG at rest shows poor R-wave progression in V1 through V3. There are no ischemic changes at rest. With stress, there was no change in the EKG. The patient experienced dyspnea but no chest discomfort during infusion of Lexiscan.  The quality of the images is satisfactory. There is a medium-sized fixed perfusion defect involving the apex and anteroapical segments. This is consistent with a scar. There is no significant reversible ischemia.  The end diastolic volume is 123 ML. The end systolic volume is 65 ML. The left ventricular ejection fraction is 47%. There is mild apical hypokinesis.  IMPRESSION:  Abnormal LexiScan Myoviewdemonstrating a fixed anteroapical scar of medium size. There is no reversible ischemia. There is mild left ventricular dysfunction with mild apical hypokinesis.   Electronically Signed   By: Cassell Clementhomas  Brackbill  M.D.   On: 05/29/2013 15:48   Nm Pulmonary Perf And Vent  05/25/2013   CLINICAL DATA:  Shortness of breath and elevated D-dimer.  EXAM: NUCLEAR MEDICINE VENTILATION - PERFUSION LUNG SCAN  TECHNIQUE: Ventilation images were obtained in multiple projections using inhaled aerosol technetium 99 M DTPA. Perfusion images were obtained in multiple projections after intravenous injection of Tc-6847m MAA.  RADIOPHARMACEUTICALS:  38 mCi Tc-2447m DTPA aerosol  and 5 mCi Tc-76m MAA  COMPARISON:  Chest radiograph 05/25/2013  FINDINGS: Ventilation: Pooling of tracer activity demonstrated in the esophagus. Limited activity demonstrated in the lungs but visualized activity is homogeneous and symmetrical.  Perfusion: Normal homogeneous distribution of tracer activity throughout both lungs. No focal perfusion defects demonstrated.  IMPRESSION: Low probability of pulmonary embolus.   Electronically Signed   By: Burman Nieves M.D.   On: 05/25/2013 22:05   Dg Chest Port 1 View  05/27/2013   CLINICAL DATA:  Followup pulmonary edema  EXAM: PORTABLE CHEST - 1 VIEW  COMPARISON:  05/26/2013  FINDINGS: Pulmonary edema has mildly improved.  No new lung opacities.  No convincing pleural effusion.  No pneumothorax.  Cardiac silhouette is normal in size.  IMPRESSION: Mild improvement in pulmonary edema.   Electronically Signed   By: Amie Portland M.D.   On: 05/27/2013 14:28   Dg Chest Port 1 View  05/26/2013   CLINICAL DATA:  Shortness of breath.  EXAM: PORTABLE CHEST - 1 VIEW  COMPARISON:  05/25/2013.  FINDINGS: The heart is enlarged. There is a fulminant pattern of pulmonary edema. No definite pleural effusions.  IMPRESSION: Interval development of fulminant pulmonary edema.   Electronically Signed   By: Loralie Champagne M.D.   On: 05/26/2013 14:47    Microbiology: Recent Results (from the past 240 hour(s))  MRSA PCR SCREENING     Status: Abnormal   Collection Time    05/25/13  3:34 PM      Result Value Ref Range Status   MRSA by  PCR POSITIVE (*) NEGATIVE Final   Comment:            The GeneXpert MRSA Assay (FDA     approved for NASAL specimens     only), is one component of a     comprehensive MRSA colonization     surveillance program. It is not     intended to diagnose MRSA     infection nor to guide or     monitor treatment for     MRSA infections.     RESULT CALLED TO, READ BACK BY AND VERIFIED WITH:     A.MABRY RN AT 1732 ON [id number] BY C.BONGEL     Labs: Basic Metabolic Panel:  Recent Labs Lab 05/27/13 0310 05/27/13 0930 05/28/13 0110 05/29/13 0305 05/30/13 0350  NA 138 137 137 137 135*  K 4.3 4.4 4.3 4.1 3.9  CL 106 104 105 105 103  CO2 17* 17* 18* 18* 18*  GLUCOSE 188* 152* 147* 136* 142*  BUN 63* 62* 61* 59* 57*  CREATININE 2.14* 2.10* 2.23* 2.18* 2.07*  CALCIUM 8.7 8.9 8.8 8.6 9.1   Liver Function Tests:  Recent Labs Lab 05/25/13 0545  AST 12  ALT 14  ALKPHOS 105  BILITOT 0.4  PROT 8.1  ALBUMIN 3.7   No results found for this basename: LIPASE, AMYLASE,  in the last 168 hours No results found for this basename: AMMONIA,  in the last 168 hours CBC:  Recent Labs Lab 05/26/13 0340 05/27/13 0310 05/28/13 0110 05/29/13 0305 05/30/13 0350  WBC 20.5* 13.1* 10.6* 9.7 10.2  NEUTROABS 17.7* 10.2*  --   --   --   HGB 9.2* 8.3* 8.1* 8.2* 9.3*  HCT 27.8* 25.1* 24.9* 25.1* 29.0*  MCV 87.1 87.5 88.9 88.4 87.6  PLT 276 267 267 280 317   Cardiac Enzymes:  Recent Labs Lab 05/26/13 1511 05/26/13 2037 05/27/13 0310  TROPONINI 8.07* 7.47* 6.28*  BNP: BNP (last 3 results)  Recent Labs  11/01/12 1446 03/24/13 1200 05/25/13 0545  PROBNP 2441.0* 6479.0* 3258.0*   CBG:  Recent Labs Lab 05/29/13 0823 05/29/13 1450 05/29/13 1647 05/29/13 2111 05/30/13 0737  GLUCAP 161* 124* 171* 158* 156*       Signed:  Henderson Cloud  [facility] Pager: [phone] 05/30/2013, 12:12 PM

## 2013-06-04 ENCOUNTER — Encounter (HOSPITAL_COMMUNITY): Payer: Self-pay

## 2013-06-04 ENCOUNTER — Inpatient Hospital Stay (HOSPITAL_COMMUNITY): Admit: 2013-06-04 | Payer: Self-pay

## 2013-06-13 ENCOUNTER — Inpatient Hospital Stay: Payer: Self-pay | Admitting: Internal Medicine

## 2013-07-04 ENCOUNTER — Encounter (HOSPITAL_COMMUNITY)
Admission: RE | Admit: 2013-07-04 | Discharge: 2013-07-04 | Disposition: A | Discharge status: Home / Self Care | Payer: 59 | Source: Ambulatory Visit | Attending: Nephrology | Admitting: Nephrology

## 2013-07-04 DIAGNOSIS — N183 Chronic kidney disease, stage 3 unspecified: Secondary | ICD-10-CM | POA: Diagnosis not present

## 2013-07-04 DIAGNOSIS — D638 Anemia in other chronic diseases classified elsewhere: Secondary | ICD-10-CM | POA: Diagnosis not present

## 2013-07-04 LAB — POCT HEMOGLOBIN-HEMACUE: HEMOGLOBIN: 9.5 g/dL — AB (ref 12.0–15.0)

## 2013-07-04 LAB — IRON AND TIBC
Iron: 60 ug/dL (ref 42–135)
Saturation Ratios: 25 % (ref 20–55)
TIBC: 239 ug/dL — AB (ref 250–470)
UIBC: 179 ug/dL (ref 125–400)

## 2013-07-04 LAB — FERRITIN: Ferritin: 368 ng/mL — ABNORMAL HIGH (ref 10–291)

## 2013-07-04 MED ORDER — DARBEPOETIN ALFA-POLYSORBATE 60 MCG/0.3ML IJ SOLN
60.0000 ug | INTRAMUSCULAR | Status: DC
  Administered 2013-07-04: 60 ug via SUBCUTANEOUS

## 2013-07-04 MED ORDER — DARBEPOETIN ALFA-POLYSORBATE 60 MCG/0.3ML IJ SOLN
INTRAMUSCULAR | Status: AC
  Filled 2013-07-04: qty 0.3

## 2013-07-12 ENCOUNTER — Telehealth (HOSPITAL_COMMUNITY): Payer: Self-pay | Admitting: Cardiology

## 2013-07-12 NOTE — Telephone Encounter (Signed)
Pt called with concerns regarding meds Pt states she recently started PLAVIX Since starting meds- c/o nose bleeds and bruising Please advise

## 2013-07-18 ENCOUNTER — Encounter (HOSPITAL_COMMUNITY)
Admission: RE | Admit: 2013-07-18 | Discharge: 2013-07-18 | Disposition: A | Discharge status: Home / Self Care | Payer: 59 | Source: Ambulatory Visit | Attending: Nephrology | Admitting: Nephrology

## 2013-07-18 DIAGNOSIS — D638 Anemia in other chronic diseases classified elsewhere: Secondary | ICD-10-CM | POA: Diagnosis not present

## 2013-07-18 LAB — POCT HEMOGLOBIN-HEMACUE: Hemoglobin: 9.6 g/dL — ABNORMAL LOW (ref 12.0–15.0)

## 2013-07-18 MED ORDER — DARBEPOETIN ALFA-POLYSORBATE 60 MCG/0.3ML IJ SOLN
INTRAMUSCULAR | Status: AC
  Filled 2013-07-18: qty 0.3

## 2013-07-18 MED ORDER — DARBEPOETIN ALFA-POLYSORBATE 60 MCG/0.3ML IJ SOLN
60.0000 ug | INTRAMUSCULAR | Status: DC
  Administered 2013-07-18: 60 ug via SUBCUTANEOUS

## 2013-07-25 ENCOUNTER — Other Ambulatory Visit (HOSPITAL_COMMUNITY): Payer: Self-pay | Admitting: Internal Medicine

## 2013-07-25 NOTE — Telephone Encounter (Signed)
Have left a couple of messages with no return call, left mess today if still having issue to call us back

## 2013-08-01 ENCOUNTER — Encounter (HOSPITAL_COMMUNITY)
Admission: RE | Admit: 2013-08-01 | Discharge: 2013-08-01 | Disposition: A | Discharge status: Home / Self Care | Payer: 59 | Source: Ambulatory Visit | Attending: Nephrology | Admitting: Nephrology

## 2013-08-01 ENCOUNTER — Encounter (HOSPITAL_COMMUNITY): Payer: Self-pay

## 2013-08-01 ENCOUNTER — Encounter (HOSPITAL_COMMUNITY): Payer: Self-pay | Admitting: Anesthesiology

## 2013-08-01 ENCOUNTER — Ambulatory Visit (HOSPITAL_COMMUNITY)
Admission: RE | Admit: 2013-08-01 | Discharge: 2013-08-01 | Disposition: A | Discharge status: Home / Self Care | Payer: 59 | Source: Ambulatory Visit | Attending: Anesthesiology | Admitting: Anesthesiology

## 2013-08-01 VITALS — BP 168/74 | HR 86 | Wt 275.0 lb

## 2013-08-01 DIAGNOSIS — N183 Chronic kidney disease, stage 3 unspecified: Secondary | ICD-10-CM | POA: Insufficient documentation

## 2013-08-01 DIAGNOSIS — Z8249 Family history of ischemic heart disease and other diseases of the circulatory system: Secondary | ICD-10-CM | POA: Insufficient documentation

## 2013-08-01 DIAGNOSIS — I2510 Atherosclerotic heart disease of native coronary artery without angina pectoris: Secondary | ICD-10-CM | POA: Insufficient documentation

## 2013-08-01 DIAGNOSIS — Z794 Long term (current) use of insulin: Secondary | ICD-10-CM | POA: Insufficient documentation

## 2013-08-01 DIAGNOSIS — G4733 Obstructive sleep apnea (adult) (pediatric): Secondary | ICD-10-CM | POA: Insufficient documentation

## 2013-08-01 DIAGNOSIS — D638 Anemia in other chronic diseases classified elsewhere: Secondary | ICD-10-CM | POA: Insufficient documentation

## 2013-08-01 DIAGNOSIS — Z7902 Long term (current) use of antithrombotics/antiplatelets: Secondary | ICD-10-CM | POA: Insufficient documentation

## 2013-08-01 DIAGNOSIS — IMO0002 Reserved for concepts with insufficient information to code with codable children: Secondary | ICD-10-CM | POA: Insufficient documentation

## 2013-08-01 DIAGNOSIS — I129 Hypertensive chronic kidney disease with stage 1 through stage 4 chronic kidney disease, or unspecified chronic kidney disease: Secondary | ICD-10-CM | POA: Insufficient documentation

## 2013-08-01 DIAGNOSIS — I10 Essential (primary) hypertension: Secondary | ICD-10-CM

## 2013-08-01 DIAGNOSIS — E785 Hyperlipidemia, unspecified: Secondary | ICD-10-CM | POA: Insufficient documentation

## 2013-08-01 DIAGNOSIS — R9431 Abnormal electrocardiogram [ECG] [EKG]: Secondary | ICD-10-CM | POA: Insufficient documentation

## 2013-08-01 DIAGNOSIS — Z79899 Other long term (current) drug therapy: Secondary | ICD-10-CM | POA: Insufficient documentation

## 2013-08-01 DIAGNOSIS — Z7982 Long term (current) use of aspirin: Secondary | ICD-10-CM | POA: Insufficient documentation

## 2013-08-01 DIAGNOSIS — I214 Non-ST elevation (NSTEMI) myocardial infarction: Secondary | ICD-10-CM

## 2013-08-01 DIAGNOSIS — I4949 Other premature depolarization: Secondary | ICD-10-CM | POA: Insufficient documentation

## 2013-08-01 DIAGNOSIS — I5032 Chronic diastolic (congestive) heart failure: Secondary | ICD-10-CM | POA: Insufficient documentation

## 2013-08-01 DIAGNOSIS — E1065 Type 1 diabetes mellitus with hyperglycemia: Secondary | ICD-10-CM | POA: Insufficient documentation

## 2013-08-01 DIAGNOSIS — N184 Chronic kidney disease, stage 4 (severe): Secondary | ICD-10-CM

## 2013-08-01 LAB — FERRITIN: Ferritin: 366 ng/mL — ABNORMAL HIGH (ref 10–291)

## 2013-08-01 LAB — IRON AND TIBC
Iron: 68 ug/dL (ref 42–135)
Saturation Ratios: 30 % (ref 20–55)
TIBC: 230 ug/dL — ABNORMAL LOW (ref 250–470)
UIBC: 162 ug/dL (ref 125–400)

## 2013-08-01 LAB — BASIC METABOLIC PANEL
Anion gap: 17 — ABNORMAL HIGH (ref 5–15)
BUN: 84 mg/dL — AB (ref 6–23)
CHLORIDE: 97 meq/L (ref 96–112)
CO2: 21 mEq/L (ref 19–32)
CREATININE: 1.87 mg/dL — AB (ref 0.50–1.10)
Calcium: 8.9 mg/dL (ref 8.4–10.5)
GFR calc Af Amer: 37 mL/min — ABNORMAL LOW (ref >90)
GFR calc non Af Amer: 32 mL/min — ABNORMAL LOW (ref >90)
GLUCOSE: 511 mg/dL — AB (ref 70–99)
Potassium: 4.3 mEq/L (ref 3.7–5.3)
Sodium: 135 mEq/L — ABNORMAL LOW (ref 137–147)

## 2013-08-01 LAB — CBC
HEMATOCRIT: 34.6 % — AB (ref 36.0–46.0)
HEMOGLOBIN: 11 g/dL — AB (ref 12.0–15.0)
MCH: 28.4 pg (ref 26.0–34.0)
MCHC: 31.8 g/dL (ref 30.0–36.0)
MCV: 89.2 fL (ref 78.0–100.0)
Platelets: 348 10*3/uL (ref 150–400)
RBC: 3.88 MIL/uL (ref 3.87–5.11)
RDW: 13.7 % (ref 11.5–15.5)
WBC: 8.6 10*3/uL (ref 4.0–10.5)

## 2013-08-01 MED ORDER — DARBEPOETIN ALFA-POLYSORBATE 60 MCG/0.3ML IJ SOLN
INTRAMUSCULAR | Status: AC
  Filled 2013-08-01: qty 0.3

## 2013-08-01 MED ORDER — DARBEPOETIN ALFA-POLYSORBATE 60 MCG/0.3ML IJ SOLN
60.0000 ug | INTRAMUSCULAR | Status: DC
  Administered 2013-08-01: 60 ug via SUBCUTANEOUS

## 2013-08-01 MED ORDER — TORSEMIDE 20 MG PO TABS: 80.0000 mg | ORAL_TABLET | Freq: Every day | ORAL | Status: DC

## 2013-08-01 MED ORDER — CARVEDILOL 6.25 MG PO TABS: 6.2500 mg | ORAL_TABLET | Freq: Two times a day (BID) | ORAL | Status: DC

## 2013-08-01 NOTE — Progress Notes (Addendum)
Patient ID: Jasmine [name], female   DOB: [birth year], 43 y.o.   MRN: [id number]  PCP: Dr. Christy Gentlesicter Nephrology: Dr Abel Prestoolodonato Pulmonary: Dr. Vassie LollAlva  HPI: [name] is a 43 y.o African American female with history of morbid obesity, nonobstructive CAD per cath 2010, HTN, OSA, uncontrolled DM type 1, class III CKD and diastolic HF.    - V/Q scan on November 02, 2010, showing normal ventilation perfusion without evidence of PE.  - CT of the chest without contrast 10/2010 showing suspected multifocal alveolar edema in the bilateral upper lobes and superior left lower lobe. Multifocal infection considered less likely. Cardiomegaly with bilateral pleural effusions. No findings to suggest interstitial lung disease.  - 11/2010 CPX Peak VO2: 10.919ml/kg/min % predicted peak VO2: 57.2% (corrects to 19.6 for ideal body weight), VE/VCO2 slope: 52 (to peak exercise) 43 (to RCP), OUES: 1.08, Peak RER: 1.19, Ventilatory Threshold: 7.2 % predicted peak VO2: 37.6%, O2pulse: 12 % predicted O2pulse: 90% - Sleep study completed 11/25/2010. Dr Vassie LollAlva evaluated severe sleep apnea, placed on CPAP.  ECHOs (09/2012): EF 55-60% with grade II diastolic dysfx ECHOs (05/2013): EF 55-60% with grade II DD  Follow up for Heart Failure: Admitted 05/25/13-05/30/13 for L shoulder pain, diabetic ketoacidosis and NSTEMI. Had nuclear test showing fixed anteroapical defect and no ischemia. VQ scan was negative. Started on Plavix. Doing well. Denies SOB, orthopnea or CP. One night she had PND and took metoalzone 2.5 mg for three days and then reports feeling better. Weight at home 278-280 lbs, discharge weigh was 256 lbs. Since starting plavix having bruising. Complaints of LLE edema. Following a low salt diet and drinking less than 2L a day.   Labs     10/25/12: K+ 4.8, Cr 3.06 (ramipril stopped)     11/01/12: K+ 5.0  Cr 1.8 (Baseline 2.3)  SH: Works FT at [employer]. Lives [place] boyfriend in [place]. Occassional ETOH. No tobacco abuse or drug abuse.    FH: Mother deceased: CAD, HTN, DM2, TIAs        Father living: Alzheimers, HIV   ROS: All systems negative except as listed in HPI, PMH and Problem List.  Past Medical History  Diagnosis Date  . Diastolic heart failure     a. EF 55-60%, grade II DD (05/2013)  . Hypertension   . Chronic kidney disease (CKD)     stage II-III  . Hyperlipidemia   . Uncontrolled diabetes mellitus   . Diabetic ketoacidosis   . Anemia   . Diabetic gastroparesis   . Obesity   . Coronary artery disease      a. Cath 2010: non obstructive CAD (30% LAD proximal, mid and distal lesions, 60% diagonal, 30% Dominant mid circumflex) b. Lexiscan Myoview (05/2013) Fixed anteroapical scar of medium sixe, no reversible ischemia  . Pneumonia due to unspecified Streptococcus 10/16/2012  . Acute on chronic diastolic heart failure 01/18/2012  . Acute respiratory failure with hypoxia 10/08/2012    Current Outpatient Prescriptions  Medication Sig Dispense Refill  . acetaminophen (TYLENOL) 500 MG tablet Take 500 mg by mouth every 6 (six) hours as needed for mild pain.      Marland Kitchen. albuterol (PROVENTIL HFA;VENTOLIN HFA) 108 (90 BASE) MCG/ACT inhaler Inhale 2 puffs into the lungs every 6 (six) hours as needed for wheezing.  1 Inhaler  2  . amLODipine (NORVASC) 10 MG tablet Take 10 mg by mouth daily.      Marland Kitchen. aspirin 81 MG tablet Take 81 mg by mouth daily. For pain      .  atorvastatin (LIPITOR) 80 MG tablet Take 1 tablet (80 mg total) by mouth daily at 6 PM.  30 tablet  2  . clonazePAM (KLONOPIN) 0.5 MG tablet Take 1 tablet (0.5 mg total) by mouth 2 (two) times daily.  60 tablet  0  . clopidogrel (PLAVIX) 75 MG tablet Take 1 tablet (75 mg total) by mouth daily with breakfast.  30 tablet  3  . ferrous sulfate 325 (65 FE) MG tablet Take 325 mg by mouth 2 (two) times daily.        . furosemide (LASIX) 80 MG tablet Take 160 mg by mouth 2 (two) times daily.       Marland Kitchen HYDROcodone-acetaminophen (NORCO) 5-325 MG per tablet Take 1 tablet by mouth  every 6 (six) hours as needed for moderate pain.  30 tablet  0  . insulin glargine (LANTUS) 100 UNIT/ML injection Inject 50 Units into the skin at bedtime.      . insulin lispro (HUMALOG) 100 UNIT/ML injection Inject 12 Units into the skin 3 (three) times daily before meals.      . metolazone (ZAROXOLYN) 2.5 MG tablet TAKE 1 TABLET (2.5 MG TOTAL) BY MOUTH AS DIRECTED.  6 tablet  1  . metoprolol tartrate (LOPRESSOR) 25 MG tablet Take 25 mg by mouth daily.      . Prenatal Vit-Fe Fumarate-FA (PRENATAL MULTIVITAMIN) TABS tablet Take 1 tablet by mouth daily at 12 noon.       No current facility-administered medications for this encounter.     Filed Vitals:   08/01/13 1409  BP: 168/74  Pulse: 86  Weight: 275 lb (124.739 kg)  SpO2: 100%    PHYSICAL EXAM: General:  Well appearing. No resp difficulty HEENT: normal Neck: supple. JVP difficult to assess d/t body habitus but appears mildly elevated.  Carotids 2+ bilaterally; no bruits. No lymphadenopathy or thryomegaly appreciated. Cor: PMI normal. Regular rate & rhythm. No rubs, gallops or murmurs. Lungs: clear Abdomen: obese, soft, nontender, +distended. No hepatosplenomegaly. No bruits or masses. Good bowel sounds. Extremities: no cyanosis, clubbing, rash, L 2+ edema and R 1+edema Neuro: alert & orientedx3, cranial nerves grossly intact. Moves all 4 extremities w/o difficulty. Affect pleasa  EKG: NSR with PVCs 86 bpm  ASSESSMENT & PLAN:  1. Chronic diastolic Heart Failure:  EF 55-60% grade II diastolic dysfx (05/2013) - Reviewed discharge summary and she was in the hospital in May for NSTEMI. She had nuclear test which showed no ischemia and started on Plavix. Diuresed and d/c weight 256 lbs. - NYHA II symptoms and volume status appears mildly elevated. Her weight is 275 lbs which is up about 20 lbs from discharge. Will change from lasix to torsemide 80 mg BID. Check BMET today and next week. - SBP elevated. Will continue norvasc 10 mg  daily. Will stop metoprolol tartrate and start coreg 6.25 mg BID. Will get BP check next week. She was on hydralazine in the past and may need to be added back. - Reinforced the need and importance of daily weights, a low sodium diet, and fluid restriction (less than 2 L a day). Instructed to call the HF clinic if weight increases more than 3 lbs overnight or 5 lbs in a week.  2. HTN  - As above will switch to coreg and may need to add hydralazine.  3. OSA - Continue to wear CPAP nightly.  4. CKD, stage III - Followed by Dr Arrie Aran. Will get BMET today. 5. CAD: non-obstructive on last cath. Recent myoview  showing no ischemia. Will continue Plavix, ASA, statin and BB.   F/U 1 month Aundria RudCosgrove, Asmi Fugere B 2:17 PM

## 2013-08-01 NOTE — Patient Instructions (Signed)
Stop lasix and start taking torsemide 80 mg (4 tablets) in the am and 80 mg (4 tablets) in the pm. Call me if too expensive or if weight dropping less than 255 lbs.  Stop metoprolol and start coreg 6.25 mg (1 tablet) in the morning and 6.25 mg (1 tablet) in the evening.  Come by clinic next week on Wednesday for BP check and labs.   Follow up in 1 month.  Do the following things EVERYDAY: 1) Weigh yourself in the morning before breakfast. Write it down and keep it in a log. 2) Take your medicines as prescribed 3) Eat low salt foods-Limit salt (sodium) to 2000 mg per day.  4) Stay as active as you can everyday 5) Limit all fluids for the day to less than 2 liters 6)

## 2013-08-02 NOTE — Addendum Note (Signed)
Encounter addended by: Simon RheinSandra C Dray Dente, CCT on: 08/02/2013  8:34 AM<BR>     Documentation filed: Charges VN

## 2013-08-08 ENCOUNTER — Encounter (HOSPITAL_COMMUNITY): Payer: Self-pay

## 2013-08-08 ENCOUNTER — Ambulatory Visit (HOSPITAL_COMMUNITY)
Admission: RE | Admit: 2013-08-08 | Discharge: 2013-08-08 | Disposition: A | Discharge status: Home / Self Care | Payer: 59 | Source: Ambulatory Visit | Attending: Cardiology | Admitting: Cardiology

## 2013-08-08 VITALS — BP 136/78

## 2013-08-08 DIAGNOSIS — I5032 Chronic diastolic (congestive) heart failure: Secondary | ICD-10-CM

## 2013-08-08 DIAGNOSIS — I509 Heart failure, unspecified: Secondary | ICD-10-CM | POA: Insufficient documentation

## 2013-08-08 DIAGNOSIS — I129 Hypertensive chronic kidney disease with stage 1 through stage 4 chronic kidney disease, or unspecified chronic kidney disease: Secondary | ICD-10-CM | POA: Insufficient documentation

## 2013-08-08 DIAGNOSIS — I2510 Atherosclerotic heart disease of native coronary artery without angina pectoris: Secondary | ICD-10-CM | POA: Insufficient documentation

## 2013-08-08 DIAGNOSIS — Z9861 Coronary angioplasty status: Secondary | ICD-10-CM | POA: Insufficient documentation

## 2013-08-08 DIAGNOSIS — E119 Type 2 diabetes mellitus without complications: Secondary | ICD-10-CM | POA: Insufficient documentation

## 2013-08-08 DIAGNOSIS — Z794 Long term (current) use of insulin: Secondary | ICD-10-CM | POA: Insufficient documentation

## 2013-08-08 DIAGNOSIS — N183 Chronic kidney disease, stage 3 unspecified: Secondary | ICD-10-CM | POA: Insufficient documentation

## 2013-08-08 DIAGNOSIS — I252 Old myocardial infarction: Secondary | ICD-10-CM | POA: Insufficient documentation

## 2013-08-08 DIAGNOSIS — G4733 Obstructive sleep apnea (adult) (pediatric): Secondary | ICD-10-CM | POA: Insufficient documentation

## 2013-08-08 DIAGNOSIS — I5022 Chronic systolic (congestive) heart failure: Secondary | ICD-10-CM

## 2013-08-08 DIAGNOSIS — Z7901 Long term (current) use of anticoagulants: Secondary | ICD-10-CM | POA: Insufficient documentation

## 2013-08-08 DIAGNOSIS — Z7982 Long term (current) use of aspirin: Secondary | ICD-10-CM | POA: Insufficient documentation

## 2013-08-08 DIAGNOSIS — Z9989 Dependence on other enabling machines and devices: Secondary | ICD-10-CM | POA: Insufficient documentation

## 2013-08-08 LAB — BASIC METABOLIC PANEL
Anion gap: 18 — ABNORMAL HIGH (ref 5–15)
BUN: 68 mg/dL — ABNORMAL HIGH (ref 6–23)
CHLORIDE: 102 meq/L (ref 96–112)
CO2: 19 mEq/L (ref 19–32)
CREATININE: 1.8 mg/dL — AB (ref 0.50–1.10)
Calcium: 8.4 mg/dL (ref 8.4–10.5)
GFR calc non Af Amer: 34 mL/min — ABNORMAL LOW (ref >90)
GFR, EST AFRICAN AMERICAN: 39 mL/min — AB (ref >90)
Glucose, Bld: 189 mg/dL — ABNORMAL HIGH (ref 70–99)
POTASSIUM: 4.1 meq/L (ref 3.7–5.3)
SODIUM: 139 meq/L (ref 137–147)

## 2013-08-08 MED ORDER — TORSEMIDE 20 MG PO TABS: 80.0000 mg | ORAL_TABLET | Freq: Two times a day (BID) | ORAL | Status: DC

## 2013-08-08 NOTE — Addendum Note (Signed)
Encounter addended by: Ave FilterMegan Genevea Marisa Hufstetler, RN on: 08/08/2013  2:53 PM<BR>     Documentation filed: Notes Section

## 2013-08-08 NOTE — Progress Notes (Signed)
Patient came to [facility] HF clinic for BP check and bmet repeat 1 week after med changes.  BP 136/78, reports some dizziness with positional changes and being sleepy the first few days, but is now subsiding.  Feeling much better now and is pleased with her healthcare regimen. Jasmine [name], [name]

## 2013-08-15 ENCOUNTER — Encounter (HOSPITAL_COMMUNITY): Payer: Self-pay

## 2013-08-22 ENCOUNTER — Encounter (HOSPITAL_COMMUNITY)
Admission: RE | Admit: 2013-08-22 | Discharge: 2013-08-22 | Disposition: A | Discharge status: Home / Self Care | Payer: 59 | Source: Ambulatory Visit | Attending: Nephrology | Admitting: Nephrology

## 2013-08-22 DIAGNOSIS — D638 Anemia in other chronic diseases classified elsewhere: Secondary | ICD-10-CM | POA: Diagnosis not present

## 2013-08-22 LAB — POCT HEMOGLOBIN-HEMACUE: Hemoglobin: 10.3 g/dL — ABNORMAL LOW (ref 12.0–15.0)

## 2013-08-22 MED ORDER — DARBEPOETIN ALFA-POLYSORBATE 60 MCG/0.3ML IJ SOLN
60.0000 ug | INTRAMUSCULAR | Status: DC
  Administered 2013-08-22: 60 ug via SUBCUTANEOUS

## 2013-08-22 MED ORDER — DARBEPOETIN ALFA-POLYSORBATE 60 MCG/0.3ML IJ SOLN
INTRAMUSCULAR | Status: AC
  Filled 2013-08-22: qty 0.3

## 2013-08-29 ENCOUNTER — Ambulatory Visit (HOSPITAL_COMMUNITY)
Admission: RE | Admit: 2013-08-29 | Discharge: 2013-08-29 | Disposition: A | Discharge status: Home / Self Care | Payer: 59 | Source: Ambulatory Visit | Attending: Internal Medicine | Admitting: Internal Medicine

## 2013-08-29 ENCOUNTER — Encounter (HOSPITAL_COMMUNITY): Payer: Self-pay

## 2013-08-29 VITALS — BP 168/74 | HR 87 | Wt 277.4 lb

## 2013-08-29 DIAGNOSIS — E785 Hyperlipidemia, unspecified: Secondary | ICD-10-CM | POA: Insufficient documentation

## 2013-08-29 DIAGNOSIS — N183 Chronic kidney disease, stage 3 unspecified: Secondary | ICD-10-CM | POA: Insufficient documentation

## 2013-08-29 DIAGNOSIS — Z7901 Long term (current) use of anticoagulants: Secondary | ICD-10-CM | POA: Insufficient documentation

## 2013-08-29 DIAGNOSIS — G4733 Obstructive sleep apnea (adult) (pediatric): Secondary | ICD-10-CM | POA: Insufficient documentation

## 2013-08-29 DIAGNOSIS — N184 Chronic kidney disease, stage 4 (severe): Secondary | ICD-10-CM

## 2013-08-29 DIAGNOSIS — D649 Anemia, unspecified: Secondary | ICD-10-CM | POA: Insufficient documentation

## 2013-08-29 DIAGNOSIS — I5032 Chronic diastolic (congestive) heart failure: Secondary | ICD-10-CM | POA: Insufficient documentation

## 2013-08-29 DIAGNOSIS — I129 Hypertensive chronic kidney disease with stage 1 through stage 4 chronic kidney disease, or unspecified chronic kidney disease: Secondary | ICD-10-CM | POA: Insufficient documentation

## 2013-08-29 DIAGNOSIS — E109 Type 1 diabetes mellitus without complications: Secondary | ICD-10-CM | POA: Insufficient documentation

## 2013-08-29 DIAGNOSIS — Z7982 Long term (current) use of aspirin: Secondary | ICD-10-CM | POA: Insufficient documentation

## 2013-08-29 DIAGNOSIS — I2510 Atherosclerotic heart disease of native coronary artery without angina pectoris: Secondary | ICD-10-CM | POA: Insufficient documentation

## 2013-08-29 DIAGNOSIS — I10 Essential (primary) hypertension: Secondary | ICD-10-CM

## 2013-08-29 LAB — BASIC METABOLIC PANEL
Anion gap: 17 — ABNORMAL HIGH (ref 5–15)
BUN: 79 mg/dL — AB (ref 6–23)
CO2: 20 mEq/L (ref 19–32)
Calcium: 8.6 mg/dL (ref 8.4–10.5)
Chloride: 97 mEq/L (ref 96–112)
Creatinine, Ser: 1.98 mg/dL — ABNORMAL HIGH (ref 0.50–1.10)
GFR calc Af Amer: 35 mL/min — ABNORMAL LOW (ref >90)
GFR calc non Af Amer: 30 mL/min — ABNORMAL LOW (ref >90)
Glucose, Bld: 513 mg/dL — ABNORMAL HIGH (ref 70–99)
Potassium: 4 mEq/L (ref 3.7–5.3)
SODIUM: 134 meq/L — AB (ref 137–147)

## 2013-08-29 MED ORDER — CARVEDILOL 6.25 MG PO TABS: 9.3750 mg | ORAL_TABLET | Freq: Two times a day (BID) | ORAL | Status: DC

## 2013-08-29 NOTE — Progress Notes (Signed)
Patient ID: Jasmine [name], female   DOB: 02/11/1970, 43 y.o.   MRN: [id number]  PCP: Dr. Christy Gentles Nephrology: Dr Abel Presto Pulmonary: Dr. Vassie Loll  HPI: Jasmine [name] is a 43 y.o African American female with history of morbid obesity, nonobstructive CAD per cath 2010, HTN, OSA, uncontrolled DM type 1, class III CKD and diastolic HF.    Admitted 05/25/13-05/30/13 for L shoulder pain, diabetic ketoacidosis and NSTEMI. Had nuclear test showing fixed anteroapical defect and no ischemia. VQ scan was negative. Started on Plavix.   Follow up for Heart Failure:  Last visit lasix was changed to torsemide. Overall feeling better. Denies SOB/PND/Orthopnea. Weight at  Home 275-280 pounds. Able to walk 15-20 minutes about 2 times a week. She has not needed any metolazone. Taking all medications. Uses CPAP at least 5 nights a week. Tries to follow low salt diet and limits fluid intake to < 2 liters per day. Continues to work full time with [employer].   - V/Q scan on November 02, 2010, showing normal ventilation perfusion without evidence of PE.  - CT of the chest without contrast 10/2010 showing suspected multifocal alveolar edema in the bilateral upper lobes and superior left lower lobe. Multifocal infection considered less likely. Cardiomegaly with bilateral pleural effusions. No findings to suggest interstitial lung disease.  - 11/2010 CPX Peak VO2: 10.58ml/kg/min % predicted peak VO2: 57.2% (corrects to 19.6 for ideal body weight), VE/VCO2 slope: 52 (to peak exercise) 43 (to RCP), OUES: 1.08, Peak RER: 1.19, Ventilatory Threshold: 7.2 % predicted peak VO2: 37.6%, O2pulse: 12 % predicted O2pulse: 90% - Sleep study completed 11/25/2010. Dr Vassie Loll evaluated severe sleep apnea, placed on CPAP.  ECHOs (09/2012): EF 55-60% with grade II diastolic dysfx ECHOs (05/2013): EF 55-60% with grade II DD  Labs     10/25/12: K+ 4.8, Cr 3.06 (ramipril stopped)     11/01/12: K+ 5.0  Cr 1.8 (Baseline 2.3)     08/08/13 K 4.1 Creatinine 1.8    SH: Works FT at post [employer]. Lives with boyfriend in [place]. Occassional ETOH. No tobacco abuse or drug abuse.   FH: Mother deceased: CAD, HTN, DM2, TIAs        Father living: Alzheimers, HIV   ROS: All systems negative except as listed in HPI, PMH and Problem List.  Past Medical History  Diagnosis Date  . Diastolic heart failure     a. EF 55-60%, grade II DD (05/2013)  . Hypertension   . Chronic kidney disease (CKD)     stage II-III  . Hyperlipidemia   . Uncontrolled diabetes mellitus   . Diabetic ketoacidosis   . Anemia   . Diabetic gastroparesis   . Obesity   . Coronary artery disease      a. Cath 2010: non obstructive CAD (30% LAD proximal, mid and distal lesions, 60% diagonal, 30% Dominant mid circumflex) b. Lexiscan Myoview (05/2013) Fixed anteroapical scar of medium sixe, no reversible ischemia  . Pneumonia due to unspecified Streptococcus 10/16/2012  . Acute on chronic diastolic heart failure 01/18/2012  . Acute respiratory failure with hypoxia 10/08/2012    Current Outpatient Prescriptions  Medication Sig Dispense Refill  . acetaminophen (TYLENOL) 500 MG tablet Take 500 mg by mouth every 6 (six) hours as needed for mild pain.      Marland Kitchen albuterol (PROVENTIL HFA;VENTOLIN HFA) 108 (90 BASE) MCG/ACT inhaler Inhale 2 puffs into the lungs every 6 (six) hours as needed for wheezing.  1 Inhaler  2  . amLODipine (NORVASC) 10 MG  tablet Take 10 mg by mouth daily.      Marland Kitchen. aspirin 81 MG tablet Take 81 mg by mouth daily. For pain      . atorvastatin (LIPITOR) 80 MG tablet Take 1 tablet (80 mg total) by mouth daily at 6 PM.  30 tablet  2  . carvedilol (COREG) 6.25 MG tablet Take 1 tablet (6.25 mg total) by mouth 2 (two) times daily with a meal.  60 tablet  3  . clopidogrel (PLAVIX) 75 MG tablet Take 1 tablet (75 mg total) by mouth daily with breakfast.  30 tablet  3  . ferrous sulfate 325 (65 FE) MG tablet Take 325 mg by mouth 2 (two) times daily.        Marland Kitchen. HYDROcodone-acetaminophen (NORCO)  5-325 MG per tablet Take 1 tablet by mouth every 6 (six) hours as needed for moderate pain.  30 tablet  0  . insulin glargine (LANTUS) 100 UNIT/ML injection Inject 50 Units into the skin at bedtime.      . insulin lispro (HUMALOG) 100 UNIT/ML injection Inject 12 Units into the skin 3 (three) times daily before meals.      . metolazone (ZAROXOLYN) 2.5 MG tablet TAKE 1 TABLET (2.5 MG TOTAL) BY MOUTH AS DIRECTED.  6 tablet  1  . Prenatal Vit-Fe Fumarate-FA (PRENATAL MULTIVITAMIN) TABS tablet Take 1 tablet by mouth daily at 12 noon.      . torsemide (DEMADEX) 20 MG tablet Take 4 tablets (80 mg total) by mouth 2 (two) times daily.  240 tablet  3   No current facility-administered medications for this encounter.     Filed Vitals:   08/29/13 1519  BP: 168/74  Pulse: 87  Weight: 277 lb 6.4 oz (125.828 kg)  SpO2: 97%    PHYSICAL EXAM: General:  Well appearing. No resp difficulty HEENT: normal Neck: supple. JVP difficult to assess d/t body habitus but does not appear elevated.  Carotids 2+ bilaterally; no bruits. No lymphadenopathy or thryomegaly appreciated. Cor: PMI normal. Regular rate & rhythm. No rubs, gallops or murmurs. Lungs: clear Abdomen: obese, soft, nontender, +distended. No hepatosplenomegaly. No bruits or masses. Good bowel sounds. Extremities: no cyanosis, clubbing, rash, L trace to 1+ edema. R  No edema Neuro: alert & orientedx3, cranial nerves grossly intact. Moves all 4 extremities w/o difficulty. Affect pleasa    ASSESSMENT & PLAN:  1. Chronic diastolic Heart Failure:  EF 55-60% grade II diastolic dysfx (05/2013) - Volume status stable despite weight gain. Continue torsemide 80 mg BID. Add compression stockings.  - Reinforced the need and importance of daily weights, a low sodium diet, and fluid restriction (less than 2 L a day). Instructed to call the HF clinic if weight increases more than 3 lbs overnight or 5 lbs in a week.   2. HTN  - SBP elevated. Continue norvasc  10 mg daily. Increase  Carvedilol to 9.375 mg twice a day.  No ACE due to CKD.  3. OSA - Continue to wear CPAP nightly.  4. CKD, stage III - Followed by Dr Arrie Aranoladonato. Will get BMET today. 5. CAD: non-obstructive on last cath. Recent myoview showing no ischemia. Will continue Plavix, ASA, statin and BB.  6. DM- having difficulty managing glucose.  I have asked to her follow up with Dr Christy Gentlesicter  Follow up in 1 month . May be able to refer to general cardiology next visit.   CLEGG,AMY NP-C  3:27 PM

## 2013-08-29 NOTE — Patient Instructions (Signed)
Follow up in 1 month    Take carvedlol 9.375 mg twice a day  Do the following things EVERYDAY: 1) Weigh yourself in the morning before breakfast. Write it down and keep it in a log. 2) Take your medicines as prescribed 3) Eat low salt foods-Limit salt (sodium) to 2000 mg per day.  4) Stay as active as you can everyday 5) Limit all fluids for the day to less than 2 liters

## 2013-09-05 ENCOUNTER — Encounter (HOSPITAL_COMMUNITY)
Admission: RE | Admit: 2013-09-05 | Discharge: 2013-09-05 | Disposition: A | Discharge status: Home / Self Care | Payer: 59 | Source: Ambulatory Visit | Attending: Nephrology | Admitting: Nephrology

## 2013-09-05 DIAGNOSIS — N183 Chronic kidney disease, stage 3 unspecified: Secondary | ICD-10-CM | POA: Diagnosis not present

## 2013-09-05 DIAGNOSIS — D638 Anemia in other chronic diseases classified elsewhere: Secondary | ICD-10-CM | POA: Insufficient documentation

## 2013-09-05 LAB — IRON AND TIBC
Iron: 50 ug/dL (ref 42–135)
Saturation Ratios: 23 % (ref 20–55)
TIBC: 221 ug/dL — ABNORMAL LOW (ref 250–470)
UIBC: 171 ug/dL (ref 125–400)

## 2013-09-05 LAB — POCT HEMOGLOBIN-HEMACUE: HEMOGLOBIN: 9.9 g/dL — AB (ref 12.0–15.0)

## 2013-09-05 LAB — FERRITIN: Ferritin: 331 ng/mL — ABNORMAL HIGH (ref 10–291)

## 2013-09-05 MED ORDER — DARBEPOETIN ALFA-POLYSORBATE 60 MCG/0.3ML IJ SOLN: 60.0000 ug | INTRAMUSCULAR | Status: DC

## 2013-09-05 MED ORDER — DARBEPOETIN ALFA-POLYSORBATE 60 MCG/0.3ML IJ SOLN
INTRAMUSCULAR | Status: AC
  Administered 2013-09-05: 60 ug via SUBCUTANEOUS
  Filled 2013-09-05: qty 0.3

## 2013-09-19 ENCOUNTER — Encounter (HOSPITAL_COMMUNITY)
Admission: RE | Admit: 2013-09-19 | Discharge: 2013-09-19 | Disposition: A | Discharge status: Home / Self Care | Payer: 59 | Source: Ambulatory Visit | Attending: Nephrology | Admitting: Nephrology

## 2013-09-19 DIAGNOSIS — D638 Anemia in other chronic diseases classified elsewhere: Secondary | ICD-10-CM | POA: Diagnosis not present

## 2013-09-19 LAB — POCT HEMOGLOBIN-HEMACUE: HEMOGLOBIN: 9.7 g/dL — AB (ref 12.0–15.0)

## 2013-09-19 MED ORDER — DARBEPOETIN ALFA-POLYSORBATE 60 MCG/0.3ML IJ SOLN
INTRAMUSCULAR | Status: AC
  Filled 2013-09-19: qty 0.3

## 2013-09-19 MED ORDER — DARBEPOETIN ALFA-POLYSORBATE 60 MCG/0.3ML IJ SOLN
60.0000 ug | INTRAMUSCULAR | Status: DC
  Administered 2013-09-19: 60 ug via SUBCUTANEOUS

## 2013-09-25 ENCOUNTER — Telehealth (HOSPITAL_COMMUNITY): Payer: Self-pay | Admitting: Vascular Surgery

## 2013-09-25 DIAGNOSIS — I509 Heart failure, unspecified: Secondary | ICD-10-CM

## 2013-09-25 MED ORDER — TORSEMIDE 20 MG PO TABS: 80.0000 mg | ORAL_TABLET | Freq: Two times a day (BID) | ORAL | Status: DC

## 2013-09-25 NOTE — Telephone Encounter (Signed)
Refill request from Dr. Hal Hope of Torsemide

## 2013-09-25 NOTE — Telephone Encounter (Signed)
As requested refills sent into pharmacy 

## 2013-09-26 ENCOUNTER — Encounter (HOSPITAL_COMMUNITY): Payer: Self-pay

## 2013-09-26 ENCOUNTER — Telehealth (HOSPITAL_COMMUNITY): Payer: Self-pay | Admitting: Vascular Surgery

## 2013-09-26 DIAGNOSIS — I509 Heart failure, unspecified: Secondary | ICD-10-CM

## 2013-09-26 MED ORDER — CLOPIDOGREL BISULFATE 75 MG PO TABS: 75.0000 mg | ORAL_TABLET | Freq: Every day | ORAL | Status: DC

## 2013-09-26 NOTE — Telephone Encounter (Signed)
Refill Plavix 

## 2013-09-26 NOTE — Telephone Encounter (Signed)
As requested med refills sent into pharmacy  

## 2013-10-02 ENCOUNTER — Other Ambulatory Visit (HOSPITAL_COMMUNITY): Payer: Self-pay

## 2013-10-02 ENCOUNTER — Encounter: Payer: Self-pay | Admitting: *Deleted

## 2013-10-03 ENCOUNTER — Encounter: Payer: Self-pay | Admitting: Neurology

## 2013-10-03 ENCOUNTER — Ambulatory Visit (INDEPENDENT_AMBULATORY_CARE_PROVIDER_SITE_OTHER): Payer: 59 | Admitting: Neurology

## 2013-10-03 ENCOUNTER — Encounter (HOSPITAL_COMMUNITY)
Admission: RE | Admit: 2013-10-03 | Discharge: 2013-10-03 | Disposition: A | Discharge status: Home / Self Care | Payer: 59 | Source: Ambulatory Visit | Attending: Nephrology | Admitting: Nephrology

## 2013-10-03 VITALS — BP 146/78 | HR 82 | Ht 67.0 in | Wt 299.0 lb

## 2013-10-03 DIAGNOSIS — G609 Hereditary and idiopathic neuropathy, unspecified: Secondary | ICD-10-CM

## 2013-10-03 DIAGNOSIS — N183 Chronic kidney disease, stage 3 unspecified: Secondary | ICD-10-CM | POA: Insufficient documentation

## 2013-10-03 DIAGNOSIS — D638 Anemia in other chronic diseases classified elsewhere: Secondary | ICD-10-CM | POA: Insufficient documentation

## 2013-10-03 LAB — POCT HEMOGLOBIN-HEMACUE: Hemoglobin: 9.8 g/dL — ABNORMAL LOW (ref 12.0–15.0)

## 2013-10-03 LAB — IRON AND TIBC
IRON: 56 ug/dL (ref 42–135)
SATURATION RATIOS: 23 % (ref 20–55)
TIBC: 241 ug/dL — ABNORMAL LOW (ref 250–470)
UIBC: 185 ug/dL (ref 125–400)

## 2013-10-03 LAB — FERRITIN: FERRITIN: 323 ng/mL — AB (ref 10–291)

## 2013-10-03 MED ORDER — DARBEPOETIN ALFA-POLYSORBATE 60 MCG/0.3ML IJ SOLN
60.0000 ug | INTRAMUSCULAR | Status: DC
  Administered 2013-10-03: 60 ug via SUBCUTANEOUS

## 2013-10-03 MED ORDER — DULOXETINE HCL 60 MG PO CPEP: 60.0000 mg | ORAL_CAPSULE | Freq: Every day | ORAL | Status: DC

## 2013-10-03 MED ORDER — SODIUM CHLORIDE 0.9 % IV SOLN
1020.0000 mg | Freq: Once | INTRAVENOUS | Status: AC
  Administered 2013-10-03: 1020 mg via INTRAVENOUS
  Filled 2013-10-03: qty 34

## 2013-10-03 MED ORDER — DARBEPOETIN ALFA-POLYSORBATE 60 MCG/0.3ML IJ SOLN
INTRAMUSCULAR | Status: AC
  Filled 2013-10-03: qty 0.3

## 2013-10-03 MED ORDER — L-METHYLFOLATE-B6-B12 3-35-2 MG PO TABS: 1.0000 | ORAL_TABLET | Freq: Two times a day (BID) | ORAL | Status: DC

## 2013-10-03 MED ORDER — CAPSAICIN 0.075 % EX CREA: 1.0000 "application " | TOPICAL_CREAM | Freq: Three times a day (TID) | CUTANEOUS | Status: DC

## 2013-10-03 NOTE — Patient Instructions (Addendum)
Overall you are doing fairly well but I do want to suggest a few things today:   Remember to drink plenty of fluid, eat healthy meals and do not skip any meals. Try to eat protein with a every meal and eat a healthy snack such as fruit or nuts in between meals. Try to keep a regular sleep-wake schedule and try to exercise daily, particularly in the form of walking, 20-30 minutes a day, if you can.   As far as your medications are concerned, I would like to suggest:   Metanx one tab twice daily,  Cymbalta  daily.  In clinical trials, 600 mg alpha-lipoic acid has been shown to improve neuropathic deficits. It is over the counter, once daily.  Common side effects include diarrhea, nausea, drowsiness, headache, dizziness.   As far as diagnostic testing:  Lab testing today  I would like to see you back in 6 months, sooner if we need to. Please call us with any interim questions, concerns, problems, updates or refill requests.   Please also call us for any test results so we can go over those with you on the phone.  My clinical assistant and will answer any of your questions and relay your messages to me and also relay most of my messages to you.   Our phone number is [phone]. We also have an after hours call service for urgent matters and there is a physician on-call for urgent questions. For any emergencies you know to call 911 or go to the nearest emergency room

## 2013-10-03 NOTE — Progress Notes (Signed)
[facility]    Provider:  Dr Lucia Gaskins Referring Provider: Dois Davenport, MD Primary Care Physician:  Dois Davenport., MD  CC:  Pain in the legs  HPI:  Jasmine [name] is a 43 y.o. female here as a referral from Dr. Hal Hope for pain and paresthesias. Patient has a PMHx of uncontrolled diabetes. She reports pain in the lower legs from the calfs to feet and ankles symmetrically. Feels like fire, needles, cramping, numbness. She can't feel her feet. No symptoms in the fingers. Denies LBP or radicular pain. Balance is off, difficult to walk. Balance is "really bad". Pain can get to 8-9/10. Worse at night in bed. Covers can't touch feet, can't shave legs cause it hurts to touch them. Symptoms started several years ago. She has bad cramps in feet and thighs like a charlie horse that won't go away. Sometimes a hot shower helps momentarily but really nothing makes it better. Pain is all day long, never remits. Neurontin made her dizzy, not sure if it was a high dose or not. Doesn't remember the dose and hesitant to try again. Has had diabetes for 22 years, not well controlled has had DKA several times. Last A1c > 9. She has limited mobility due to the pain in her feet.   Reviewed notes, labs and imaging from outside physicians, which showed that patient has taken vicodin daily for neuropathy, she has bipolar disorder and panic attacks, HTN, DM2, diabetic gastroparesis. Glucose has been as high as 500 in the evenings when patient takes her glucose. Last hgba1c 9.1,   Review of Systems: Patient complains of symptoms per HPI as well as the following symptoms weight gain, anemia, easy bruising, swelling in legs, snoring, constipation, birth marks, anxiety, snoring. Pertinent negatives per HPI. Otherwise out of a complete 14 system review, and all other reviewed systems are negative.   History   Social History  . Marital Status: Single    Spouse Name: N/A    Number of Children: 0  .  Years of Education: BA   Occupational History  . [employer] [place] [employer]   Social History Main Topics  . Smoking status: Never Smoker   . Smokeless tobacco: Never Used  . Alcohol Use: Yes     Comment: occasionally  . Drug Use: No  . Sexual Activity: Yes    Birth Control/ Protection: IUD   Other Topics Concern  . Not on file   Social History Narrative   Patient is single with no children.   Patient lives with boyfriend.   Patient is right handed.   Patient has Bachelor's degree.   Patient drinks 1 large cup daily.    Family History  Problem Relation Age of Onset  . Heart attack Mother   . Dementia Father     Past Medical History  Diagnosis Date  . Diastolic heart failure     a. EF 55-60%, grade II DD (05/2013)  . Hypertension   . Chronic kidney disease (CKD)     stage II-III  . Hyperlipidemia   . Uncontrolled diabetes mellitus   . Diabetic ketoacidosis   . Anemia   . Diabetic gastroparesis   . Obesity   . Coronary artery disease      a. Cath 2010: non obstructive CAD (30% LAD proximal, mid and distal lesions, 60% diagonal, 30% Dominant mid circumflex) b. Lexiscan Myoview (05/2013) Fixed anteroapical scar of medium sixe, no reversible ischemia  . Pneumonia due to unspecified Streptococcus 10/16/2012  .  Acute on chronic diastolic heart failure 01/18/2012  . Acute respiratory failure with hypoxia 10/08/2012  . Type II diabetes mellitus with ophthalmic manifestations   . Bipolar 1 disorder   . Congestive heart failure   . Diabetic nephropathy   . Septicemia   . Acute myocardial infarction   . Subendocardial infarction   . Abscess or cellulitis of gluteal region   . Gastroparesis   . Depressive disorder   . Cough   . Chronic kidney disease (CKD), stage IV (severe)   . Carpal tunnel syndrome   . Pain in soft tissues of limb   . Allergic rhinitis   . Acute renal failure   . Acute respiratory failure   . Abscess of anal and rectal regions   . Abdominal  pain     Past Surgical History  Procedure Laterality Date  . Cholecystectomy    . Carpal tunnel release  2003    Current Outpatient Prescriptions  Medication Sig Dispense Refill  . acetaminophen (TYLENOL) 500 MG tablet Take 500 mg by mouth every 6 (six) hours as needed for mild pain.      Marland Kitchen albuterol (PROVENTIL HFA;VENTOLIN HFA) 108 (90 BASE) MCG/ACT inhaler Inhale 2 puffs into the lungs every 6 (six) hours as needed for wheezing.  1 Inhaler  2  . amLODipine (NORVASC) 10 MG tablet Take 10 mg by mouth daily.      Marland Kitchen aspirin 81 MG tablet Take 81 mg by mouth daily. For pain      . atorvastatin (LIPITOR) 80 MG tablet Take 1 tablet (80 mg total) by mouth daily at 6 PM.  30 tablet  2  . carvedilol (COREG) 6.25 MG tablet Take 1.5 tablets (9.375 mg total) by mouth 2 (two) times daily with a meal.  90 tablet  3  . clopidogrel (PLAVIX) 75 MG tablet Take 1 tablet (75 mg total) by mouth daily with breakfast.  30 tablet  3  . cyclobenzaprine (FLEXERIL) 10 MG tablet Take by mouth.      . ferrous sulfate 325 (65 FE) MG tablet Take 325 mg by mouth 2 (two) times daily.        Marland Kitchen HYDROcodone-acetaminophen (NORCO) 5-325 MG per tablet Take 1 tablet by mouth every 6 (six) hours as needed for moderate pain.  30 tablet  0  . insulin glargine (LANTUS) 100 UNIT/ML injection Inject 50 Units into the skin at bedtime.      . insulin lispro (HUMALOG) 100 UNIT/ML injection Inject 12 Units into the skin 3 (three) times daily before meals.      . metoCLOPramide (REGLAN) 10 MG tablet Take 10 mg by mouth 3 (three) times daily before meals. One po qac      . metolazone (ZAROXOLYN) 2.5 MG tablet TAKE 1 TABLET (2.5 MG TOTAL) BY MOUTH AS DIRECTED.  6 tablet  1  . metoprolol tartrate (LOPRESSOR) 25 MG tablet Take 25 mg by mouth daily.       . Prenatal Vit-Fe Fumarate-FA (PRENATAL MULTIVITAMIN) TABS tablet Take 1 tablet by mouth daily at 12 noon.      . torsemide (DEMADEX) 20 MG tablet Take 4 tablets (80 mg total) by mouth 2 (two)  times daily.  240 tablet  3  . capsicum (ZOSTRIX) 0.075 % topical cream Apply 1 application topically 3 (three) times daily. Apply to feet up to 3x daily  5 g  3  . DULoxetine (CYMBALTA) 60 MG capsule Take 1 capsule (60 mg total) by mouth daily.  30 capsule  6  . l-methylfolate-B6-B12 (METANX) 3-35-2 MG TABS Take 1 tablet by mouth 2 (two) times daily.  60 tablet  6   No current facility-administered medications for this visit.   Facility-Administered Medications Ordered in Other Visits  Medication Dose Route Frequency Provider Last Rate Last Dose  . darbepoetin (ARANESP) 60 MCG/0.3ML injection           . darbepoetin (ARANESP) injection 60 mcg  60 mcg Subcutaneous Q14 Days Irena Cords, MD   60 mcg at 10/03/13 1244    Allergies as of 10/03/2013 - Review Complete 10/03/2013  Allergen Reaction Noted  . Contrast media [iodinated diagnostic agents] Nausea And Vomiting 11/09/2010  . Paroxetine Nausea And Vomiting   . Oxycodone Itching and Rash 11/20/2010    Vitals: BP 146/78  Pulse 82  Ht  (1.702 m)  Wt 299 lb (135.626 kg)  BMI 46.82 kg/m2 Last Weight:  Wt Readings from Last 1 Encounters:  10/03/13 299 lb (135.626 kg)   Last Height:   Ht Readings from Last 1 Encounters:  10/03/13  (1.702 m)   Physical exam: Exam: Gen: NAD, conversant Eyes: anicteric sclerae, moist conjunctivae HENT: Atraumatic, oropharynx clear Neck: Trachea midline; supple,  Lungs: CTA, no wheezing, rales, rhonic                          CV: RRR, no MRG Abdomen: Soft, non-tender; obese Extremities: pitting edema to the ankles Skin: Normal temperature, no rash,  Psych: Appropriate affect, pleasant  Neuro: Detailed Neurologic Exam  Speech:    Speech is normal; fluent and spontaneous with normal comprehension.  Cognition:    The patient is oriented to person, place, and time; memory intact; language fluent; normal attention, concentration, and fund of knowledge.  Cranial Nerves:    The  pupils are equal, round, and reactive to light.  Visual fields are full to finger confrontation. Extraocular movements are intact. Trigeminal sensation is intact and the muscles of mastication are normal. The face is symmetric. The palate elevates in the midline. Voice is normal. Shoulder shrug is normal. The tongue has normal motion without fasciculations.   Coordination:    Normal finger to nose and heel to shin.   Gait:    Normal.   Motor Observation:    No asymmetry, no atrophy, and no involuntary movements noted. Tone:    Normal muscle tone.    Posture:    Posture is normal. normal erect    Strength:    Strength is V/V in the upper and lower limbs.       Sensation: Patient declined taking shoes off so difficult sensory exam however: Decreased temp, pp in a glove+stocking Absent vibration at malleous bilat  Reflex Exam:  DTR's:    Absent at ankles, otherwise deep tendon reflexes in the upper and lower extremities are normal bilaterally.    Assessment/Plan: 43 year old female with uncontrolled diabetes who is here for severe progression of painful paresthesias in her feet which have significantly limited her mobility and cause her much distress. Neuro exam significant for decreased sensation in a glove and stocking distribution as well as allodynia. 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 However will order a serum neuropathy panel to rule out other causes as well. Patient requests a scooter, will order too.      In clinical trials, 600  mg alpha-lipoic acid daily has been shown to improve neuropathic deficits. Also Metanx may help. Discussed common side effects. Cymbalta  daily for the pain. Discussed common side effects.  Follow up in 3-6 months    Naomie Dean, MD  [facility] [address] [place], [place] [address]  Phone  [phone] Fax [phone]

## 2013-10-05 ENCOUNTER — Other Ambulatory Visit: Payer: Self-pay | Admitting: Neurology

## 2013-10-05 DIAGNOSIS — G8929 Other chronic pain: Secondary | ICD-10-CM

## 2013-10-05 DIAGNOSIS — R269 Unspecified abnormalities of gait and mobility: Secondary | ICD-10-CM

## 2013-10-05 DIAGNOSIS — Z7409 Other reduced mobility: Secondary | ICD-10-CM

## 2013-10-05 DIAGNOSIS — G609 Hereditary and idiopathic neuropathy, unspecified: Secondary | ICD-10-CM

## 2013-10-08 ENCOUNTER — Other Ambulatory Visit: Payer: Self-pay | Admitting: Neurology

## 2013-10-09 NOTE — Progress Notes (Signed)
Patient ID: Jasmine [name], female   DOB: [birth year], 43 y.o.   MRN: [id number]  PCP: Dr. Christy Gentles Nephrology: Dr Abel Presto Pulmonary: Dr. Vassie Loll  HPI: Jasmine [name] is a 43 y.o African American female with history of morbid obesity, nonobstructive CAD per cath 2010, HTN, OSA, uncontrolled DM type 1, class III CKD and diastolic HF.    Admitted 05/25/13-05/30/13 for L shoulder pain, diabetic ketoacidosis and NSTEMI. Had nuclear test showing fixed anteroapical defect and no ischemia. VQ scan was negative. Started on Plavix.   Follow up for Heart Failure:  Last visit she was stable with no medication changes. Overall she feels good. Denies SOB/PND/Orthopnea. Weight at home down 299 to 283 pounds. Has not needed metolazone. Trying to limit portions. Using CPAP nightly. Continues to work full time at [employer].   - V/Q scan on November 02, 2010, showing normal ventilation perfusion without evidence of PE.  - CT of the chest without contrast 10/2010 showing suspected multifocal alveolar edema in the bilateral upper lobes and superior left lower lobe. Multifocal infection considered less likely. Cardiomegaly with bilateral pleural effusions. No findings to suggest interstitial lung disease.  - 11/2010 CPX Peak VO2: 10.69ml/kg/min % predicted peak VO2: 57.2% (corrects to 19.6 for ideal body weight), VE/VCO2 slope: 52 (to peak exercise) 43 (to RCP), OUES: 1.08, Peak RER: 1.19, Ventilatory Threshold: 7.2 % predicted peak VO2: 37.6%, O2pulse: 12 % predicted O2pulse: 90% - Sleep study completed 11/25/2010. Dr Vassie Loll evaluated severe sleep apnea, placed on CPAP.  ECHOs (09/2012): EF 55-60% with grade II diastolic dysfx ECHOs (05/2013): EF 55-60% with grade II DD  Labs     10/25/12: K+ 4.8, Cr 3.06 (ramipril stopped)     11/01/12: K+ 5.0  Cr 1.8 (Baseline 2.3)     08/08/13 K 4.1 Creatinine 1.8   SH: Works FT at post office. Lives with boyfriend in [place]. Occassional ETOH. No tobacco abuse or drug abuse.   FH: Mother  deceased: CAD, HTN, DM2, TIAs        Father living: Alzheimers, HIV   ROS: All systems negative except as listed in HPI, PMH and Problem List.  Past Medical History  Diagnosis Date  . Diastolic heart failure     a. EF 55-60%, grade II DD (05/2013)  . Hypertension   . Chronic kidney disease (CKD)     stage II-III  . Hyperlipidemia   . Uncontrolled diabetes mellitus   . Diabetic ketoacidosis   . Anemia   . Diabetic gastroparesis   . Obesity   . Coronary artery disease      a. Cath 2010: non obstructive CAD (30% LAD proximal, mid and distal lesions, 60% diagonal, 30% Dominant mid circumflex) b. Lexiscan Myoview (05/2013) Fixed anteroapical scar of medium sixe, no reversible ischemia  . Pneumonia due to unspecified Streptococcus 10/16/2012  . Acute on chronic diastolic heart failure 01/18/2012  . Acute respiratory failure with hypoxia 10/08/2012  . Type II diabetes mellitus with ophthalmic manifestations   . Bipolar 1 disorder   . Congestive heart failure   . Diabetic nephropathy   . Septicemia   . Acute myocardial infarction   . Subendocardial infarction   . Abscess or cellulitis of gluteal region   . Gastroparesis   . Depressive disorder   . Cough   . Chronic kidney disease (CKD), stage IV (severe)   . Carpal tunnel syndrome   . Pain in soft tissues of limb   . Allergic rhinitis   . Acute renal failure   .  Acute respiratory failure   . Abscess of anal and rectal regions   . Abdominal pain     Current Outpatient Prescriptions  Medication Sig Dispense Refill  . acetaminophen (TYLENOL) 500 MG tablet Take 500 mg by mouth every 6 (six) hours as needed for mild pain.      Marland Kitchen albuterol (PROVENTIL HFA;VENTOLIN HFA) 108 (90 BASE) MCG/ACT inhaler Inhale 2 puffs into the lungs every 6 (six) hours as needed for wheezing.  1 Inhaler  2  . amLODipine (NORVASC) 10 MG tablet Take 10 mg by mouth daily.      Marland Kitchen aspirin 81 MG tablet Take 81 mg by mouth daily. For pain      . atorvastatin  (LIPITOR) 80 MG tablet Take 1 tablet (80 mg total) by mouth daily at 6 PM.  30 tablet  2  . capsicum (ZOSTRIX) 0.075 % topical cream Apply 1 application topically 3 (three) times daily. Apply to feet up to 3x daily  5 g  3  . carvedilol (COREG) 6.25 MG tablet Take 1.5 tablets (9.375 mg total) by mouth 2 (two) times daily with a meal.  90 tablet  3  . clopidogrel (PLAVIX) 75 MG tablet Take 1 tablet (75 mg total) by mouth daily with breakfast.  30 tablet  3  . DULoxetine (CYMBALTA) 60 MG capsule Take 1 capsule (60 mg total) by mouth daily.  30 capsule  6  . ferrous sulfate 325 (65 FE) MG tablet Take 325 mg by mouth 2 (two) times daily.        Marland Kitchen HYDROcodone-acetaminophen (NORCO) 5-325 MG per tablet Take 1 tablet by mouth every 6 (six) hours as needed for moderate pain.  30 tablet  0  . insulin glargine (LANTUS) 100 UNIT/ML injection Inject 50 Units into the skin at bedtime.      . insulin lispro (HUMALOG) 100 UNIT/ML injection Inject 12 Units into the skin 3 (three) times daily before meals.      Marland Kitchen l-methylfolate-B6-B12 (METANX) 3-35-2 MG TABS Take 1 tablet by mouth 2 (two) times daily.  60 tablet  6  . metoCLOPramide (REGLAN) 10 MG tablet Take 10 mg by mouth 3 (three) times daily before meals. One po qac      . metolazone (ZAROXOLYN) 2.5 MG tablet TAKE 1 TABLET (2.5 MG TOTAL) BY MOUTH AS DIRECTED.  6 tablet  1  . metoprolol tartrate (LOPRESSOR) 25 MG tablet Take 25 mg by mouth daily.       . Prenatal Vit-Fe Fumarate-FA (PRENATAL MULTIVITAMIN) TABS tablet Take 1 tablet by mouth daily at 12 noon.      . torsemide (DEMADEX) 20 MG tablet Take 4 tablets (80 mg total) by mouth 2 (two) times daily.  240 tablet  3   No current facility-administered medications for this encounter.     Filed Vitals:   10/10/13 1018  BP: 134/76  Pulse: 88  Resp: 18  Weight: 283 lb 2 oz (128.425 kg)  SpO2: 100%    PHYSICAL EXAM: General:  Well appearing. No resp difficulty HEENT: normal Neck: supple. JVP difficult  to assess d/t body habitus but does not appear elevated.  Carotids 2+ bilaterally; no bruits. No lymphadenopathy or thryomegaly appreciated. Cor: PMI normal. Regular rate & rhythm. No rubs, gallops or murmurs. Lungs: clear Abdomen: obese, soft, nontender, +distended. No hepatosplenomegaly. No bruits or masses. Good bowel sounds. Extremities: no cyanosis, clubbing, rash, LLE, RLE trace edema.  Neuro: alert & orientedx3, cranial nerves grossly intact. Moves all 4 extremities  w/o difficulty. Affect pleasa    ASSESSMENT & PLAN:  1. Chronic diastolic Heart Failure:  EF 55-60% grade II diastolic dysfx (05/2013) - NYHA II. Volume status stable despite weight gain. Continue torsemide 80 mg BID. Needs to wear compression stockings.  - Reinforced the need and importance of daily weights, a low sodium diet, and fluid restriction (less than 2 L a day). Instructed to call the HF clinic if weight increases more than 3 lbs overnight or 5 lbs in a week.  Needs to exercise. I have asked her to walk 15 minutes daily.  2. HTN - SBP stable. Continue norvasc 10 mg daily. Continue Carvedilol to 9.375 mg twice a day.  No ACE due to CKD.  3. OSA- Continue to wear CPAP nightly.  4. CKD, stage III- Followed by Dr Arrie Aran. Has follow up the end of the month  5. CAD: non-obstructive on last cath. Recent myoview showing no ischemia. Will continue Plavix, ASA, statin and BB.  6. DM- having difficulty managing glucose.  Followed by her PCP closely.   Follow up in 4 months  Ciaran Begay NP-C  10:46 AM

## 2013-10-10 ENCOUNTER — Other Ambulatory Visit (INDEPENDENT_AMBULATORY_CARE_PROVIDER_SITE_OTHER): Payer: Self-pay

## 2013-10-10 ENCOUNTER — Telehealth: Payer: Self-pay | Admitting: Neurology

## 2013-10-10 ENCOUNTER — Encounter (HOSPITAL_COMMUNITY): Payer: Self-pay

## 2013-10-10 ENCOUNTER — Ambulatory Visit (HOSPITAL_COMMUNITY)
Admission: RE | Admit: 2013-10-10 | Discharge: 2013-10-10 | Disposition: A | Discharge status: Home / Self Care | Payer: 59 | Source: Ambulatory Visit | Attending: Internal Medicine | Admitting: Internal Medicine

## 2013-10-10 ENCOUNTER — Other Ambulatory Visit: Payer: Self-pay | Admitting: Neurology

## 2013-10-10 VITALS — BP 134/76 | HR 88 | Resp 18 | Wt 283.1 lb

## 2013-10-10 DIAGNOSIS — Z7982 Long term (current) use of aspirin: Secondary | ICD-10-CM | POA: Insufficient documentation

## 2013-10-10 DIAGNOSIS — I509 Heart failure, unspecified: Secondary | ICD-10-CM | POA: Diagnosis not present

## 2013-10-10 DIAGNOSIS — I2510 Atherosclerotic heart disease of native coronary artery without angina pectoris: Secondary | ICD-10-CM | POA: Insufficient documentation

## 2013-10-10 DIAGNOSIS — Z0289 Encounter for other administrative examinations: Secondary | ICD-10-CM

## 2013-10-10 DIAGNOSIS — Z7901 Long term (current) use of anticoagulants: Secondary | ICD-10-CM | POA: Diagnosis not present

## 2013-10-10 DIAGNOSIS — I5032 Chronic diastolic (congestive) heart failure: Secondary | ICD-10-CM

## 2013-10-10 DIAGNOSIS — G4733 Obstructive sleep apnea (adult) (pediatric): Secondary | ICD-10-CM | POA: Diagnosis not present

## 2013-10-10 DIAGNOSIS — N183 Chronic kidney disease, stage 3 unspecified: Secondary | ICD-10-CM | POA: Insufficient documentation

## 2013-10-10 DIAGNOSIS — G609 Hereditary and idiopathic neuropathy, unspecified: Secondary | ICD-10-CM

## 2013-10-10 DIAGNOSIS — Z794 Long term (current) use of insulin: Secondary | ICD-10-CM | POA: Insufficient documentation

## 2013-10-10 DIAGNOSIS — I129 Hypertensive chronic kidney disease with stage 1 through stage 4 chronic kidney disease, or unspecified chronic kidney disease: Secondary | ICD-10-CM | POA: Diagnosis not present

## 2013-10-10 DIAGNOSIS — I10 Essential (primary) hypertension: Secondary | ICD-10-CM

## 2013-10-10 DIAGNOSIS — Z6841 Body Mass Index (BMI) 40.0 and over, adult: Secondary | ICD-10-CM | POA: Insufficient documentation

## 2013-10-10 DIAGNOSIS — E109 Type 1 diabetes mellitus without complications: Secondary | ICD-10-CM | POA: Insufficient documentation

## 2013-10-10 MED ORDER — CLOPIDOGREL BISULFATE 75 MG PO TABS: 75.0000 mg | ORAL_TABLET | Freq: Every day | ORAL | Status: DC

## 2013-10-10 NOTE — Telephone Encounter (Signed)
Patient called requesting a Rx for Hydrocodone.  Is this something you would like to prescribe?  Please advise.  Thank you.

## 2013-10-10 NOTE — Telephone Encounter (Signed)
Patient requesting refill of hydrocodone, please call when ready for pick up.  °

## 2013-10-10 NOTE — Telephone Encounter (Signed)
Absolutely not. Who called? Was it the patient or a familt member? Why does she want hydrocodone?>

## 2013-10-10 NOTE — Telephone Encounter (Signed)
I called back.  Spoke with the patient.  Said she has been taking this medication for 3 years from PCP for neuropathic pain.  Her PCP left the practice, and new PCP will not prescribe this med, so she felt we should take over this Rx.  When I advised her this is not something we typically prescribe, she became upset saying she has to have this medication for her pain.  Says if we are unable to prescribe, she would like to be referred to a pain clinic.

## 2013-10-10 NOTE — Telephone Encounter (Signed)
Thank you fo rtaking care of this Jasmine [name]! I placed the referral for her. Would you let her know? Thank you

## 2013-10-10 NOTE — Patient Instructions (Signed)
   Follow up in 4 months  Do the following things EVERYDAY: 1) Weigh yourself in the morning before breakfast. Write it down and keep it in a log. 2) Take your medicines as prescribed 3) Eat low salt foods-Limit salt (sodium) to 2000 mg per day.  4) Stay as active as you can everyday 5) Limit all fluids for the day to less than 2 liters 

## 2013-10-10 NOTE — Telephone Encounter (Signed)
I called the patient and relayed Dr Trevor Mace message.  She verbalized understanding and was agreeable to this plan.

## 2013-10-11 ENCOUNTER — Telehealth: Payer: Self-pay | Admitting: Neurology

## 2013-10-11 NOTE — Telephone Encounter (Signed)
Hi Sandy - Do you have time to call this patient back for me please? Patient has a history of chronic kidney disease. Her last labs should that her renal disease is progressing (elevated BUN and creatinine). I would like her to follow up with her primary care physician to review her renal and liver function. Also I did place a referral to pain management for her; she requested vicodin for her peripheral neuropathy and I do not prescribe narcotics. I recently prescribed cymbalta to try and help with her pain instead. Thank you.

## 2013-10-12 NOTE — Telephone Encounter (Signed)
I called pt and relayed the message.   She has appt with her kidney doctor end of month.  PM ref to call her with appt.  She is to call back in week if has not heard.  She verbalized understanding.

## 2013-10-16 LAB — COMPREHENSIVE METABOLIC PANEL
A/G RATIO: 1.3 (ref 1.1–2.5)
ALK PHOS: 141 IU/L — AB (ref 39–117)
ALT: 26 IU/L (ref 0–32)
AST: 26 IU/L (ref 0–40)
Albumin: 4 g/dL (ref 3.5–5.5)
BUN / CREAT RATIO: 26 — AB (ref 9–23)
BUN: 71 mg/dL — ABNORMAL HIGH (ref 6–24)
CALCIUM: 8.9 mg/dL (ref 8.7–10.2)
CO2: 23 mmol/L (ref 18–29)
CREATININE: 2.75 mg/dL — AB (ref 0.57–1.00)
Chloride: 91 mmol/L — ABNORMAL LOW (ref 97–108)
GFR calc Af Amer: 23 mL/min/{1.73_m2} — ABNORMAL LOW (ref >59)
GFR, EST NON AFRICAN AMERICAN: 20 mL/min/{1.73_m2} — AB (ref >59)
GLOBULIN, TOTAL: 3.2 g/dL (ref 1.5–4.5)
Glucose: 264 mg/dL — ABNORMAL HIGH (ref 65–99)
Potassium: 3.9 mmol/L (ref 3.5–5.2)
Sodium: 135 mmol/L (ref 134–144)
Total Bilirubin: 0.3 mg/dL (ref 0.0–1.2)
Total Protein: 7.2 g/dL (ref 6.0–8.5)

## 2013-10-16 LAB — IFE AND PE, SERUM
ALPHA 1: 0.3 g/dL (ref 0.1–0.4)
Albumin SerPl Elph-Mcnc: 3.5 g/dL (ref 3.2–5.6)
Albumin/Glob SerPl: 1 (ref 0.7–2.0)
Alpha2 Glob SerPl Elph-Mcnc: 1.1 g/dL (ref 0.4–1.2)
B-GLOBULIN SERPL ELPH-MCNC: 1.1 g/dL (ref 0.6–1.3)
GLOBULIN, TOTAL: 3.7 g/dL (ref 2.0–4.5)
Gamma Glob SerPl Elph-Mcnc: 1.2 g/dL (ref 0.5–1.6)
IgA/Immunoglobulin A, Serum: 161 mg/dL (ref 91–414)
IgG (Immunoglobin G), Serum: 1653 mg/dL — ABNORMAL HIGH (ref 700–1600)
IgM (Immunoglobulin M), Srm: 73 mg/dL (ref 40–230)

## 2013-10-16 LAB — RHEUMATOID FACTOR: Rhuematoid fact SerPl-aCnc: 7.1 IU/mL (ref 0.0–13.9)

## 2013-10-16 LAB — LYME, TOTAL AB TEST/REFLEX: Lyme IgG/IgM Ab: <0.91 {ISR} (ref 0.00–0.90)

## 2013-10-16 LAB — ANA W/REFLEX: Anti Nuclear Antibody(ANA): NEGATIVE

## 2013-10-16 LAB — VITAMIN B12: Vitamin B-12: 1598 pg/mL — ABNORMAL HIGH (ref 211–946)

## 2013-10-16 LAB — TSH: TSH: 1.38 u[IU]/mL (ref 0.450–4.500)

## 2013-10-17 ENCOUNTER — Inpatient Hospital Stay (HOSPITAL_COMMUNITY): Admission: RE | Admit: 2013-10-17 | Payer: Self-pay | Source: Ambulatory Visit

## 2013-10-18 ENCOUNTER — Telehealth: Payer: Self-pay

## 2013-10-18 NOTE — Telephone Encounter (Signed)
Called and left message to return call. 

## 2013-10-18 NOTE — Telephone Encounter (Signed)
Spoke to patient. Gave instructions per Dr. Lucia Gaskins. Will check status of referral.

## 2013-10-18 NOTE — Telephone Encounter (Signed)
Message copied by Doree Barthel on Thu Oct 18, 2013  9:16 AM ------      Message from: AHERN, ANTONIA B      Created: Thu Oct 11, 2013  1:20 PM       Please call patient and let her know that I called in a referral to pain management.She should continue to follow up with the primary care and nephrologist for her chronic kidney disease. Would still like to see her back in the office for afollowup in 3 months. Thank you. ------

## 2013-10-19 NOTE — Telephone Encounter (Signed)
Left a message new patient coordinator @ [facility]. Rep that answered said not showing referral received.

## 2013-10-23 NOTE — Telephone Encounter (Signed)
Spoke to [facility] referral coordinator. She states she has tried to reach patient and has not received a return call. Called patient's contact-Dennis and spoke with him. He said he would give patient message to call our office as soon as he speaks with her today.

## 2013-10-24 ENCOUNTER — Encounter (HOSPITAL_COMMUNITY): Payer: Self-pay

## 2013-10-24 ENCOUNTER — Inpatient Hospital Stay (HOSPITAL_COMMUNITY): Admission: RE | Admit: 2013-10-24 | Payer: Self-pay | Source: Ambulatory Visit

## 2013-11-10 ENCOUNTER — Telehealth: Payer: Self-pay | Admitting: Cardiology

## 2013-11-10 ENCOUNTER — Other Ambulatory Visit (HOSPITAL_COMMUNITY): Payer: Self-pay | Admitting: Internal Medicine

## 2013-11-10 MED ORDER — METOLAZONE 2.5 MG PO TABS: ORAL_TABLET | ORAL | Status: DC

## 2013-11-10 NOTE — Telephone Encounter (Signed)
    Patient is a 43 y/o female with a h/o diastolic dysfunction, followed by HF Clinic. She is on BID torsemide and takes metolazone PRN. She calls endorsing a 4 lb weight gain in 2 days as well as orthopnea. No DOE. Admits to not being fully compliant with low sodium diet, but fully compliant with diuretic. She states that she is currently out of metolazone and is requesting refill be called in to pharmacy. 2.5 [facility] [place].  Patient instructed to f/u in CHF clinic if not improved to baseline by Monday 11/12/13. Low sodium diet and continued daily weights stressed. She verbalized understanding.   Robbie LisBrittainy Jettie Mannor, PA-C 11/10/2013

## 2013-11-26 ENCOUNTER — Telehealth: Payer: Self-pay | Admitting: Neurology

## 2013-11-26 NOTE — Telephone Encounter (Signed)
Patient was referred to [facility] and had an appointment today at 10am. It is now 1:04pm and the patient says she still has not been seen. Can patient be referred to another pain clinic? Please call. Thank you.

## 2013-12-03 ENCOUNTER — Encounter (HOSPITAL_COMMUNITY): Payer: Self-pay

## 2013-12-03 ENCOUNTER — Emergency Department (HOSPITAL_COMMUNITY): Payer: 59

## 2013-12-03 ENCOUNTER — Inpatient Hospital Stay (HOSPITAL_COMMUNITY)
Admission: EM | Admit: 2013-12-03 | Discharge: 2013-12-12 | DRG: 291 | Disposition: A | Discharge status: Home / Self Care | Payer: 59 | Attending: Internal Medicine | Admitting: Internal Medicine

## 2013-12-03 DIAGNOSIS — Z885 Allergy status to narcotic agent status: Secondary | ICD-10-CM | POA: Diagnosis not present

## 2013-12-03 DIAGNOSIS — K5900 Constipation, unspecified: Secondary | ICD-10-CM | POA: Diagnosis not present

## 2013-12-03 DIAGNOSIS — E1139 Type 2 diabetes mellitus with other diabetic ophthalmic complication: Secondary | ICD-10-CM | POA: Diagnosis present

## 2013-12-03 DIAGNOSIS — I130 Hypertensive heart and chronic kidney disease with heart failure and stage 1 through stage 4 chronic kidney disease, or unspecified chronic kidney disease: Principal | ICD-10-CM | POA: Diagnosis present

## 2013-12-03 DIAGNOSIS — Z9119 Patient's noncompliance with other medical treatment and regimen: Secondary | ICD-10-CM | POA: Diagnosis present

## 2013-12-03 DIAGNOSIS — Z8249 Family history of ischemic heart disease and other diseases of the circulatory system: Secondary | ICD-10-CM

## 2013-12-03 DIAGNOSIS — R0602 Shortness of breath: Secondary | ICD-10-CM

## 2013-12-03 DIAGNOSIS — I5033 Acute on chronic diastolic (congestive) heart failure: Secondary | ICD-10-CM | POA: Diagnosis present

## 2013-12-03 DIAGNOSIS — Z7902 Long term (current) use of antithrombotics/antiplatelets: Secondary | ICD-10-CM

## 2013-12-03 DIAGNOSIS — I2510 Atherosclerotic heart disease of native coronary artery without angina pectoris: Secondary | ICD-10-CM | POA: Diagnosis present

## 2013-12-03 DIAGNOSIS — Z6841 Body Mass Index (BMI) 40.0 and over, adult: Secondary | ICD-10-CM

## 2013-12-03 DIAGNOSIS — I5031 Acute diastolic (congestive) heart failure: Secondary | ICD-10-CM

## 2013-12-03 DIAGNOSIS — N184 Chronic kidney disease, stage 4 (severe): Secondary | ICD-10-CM

## 2013-12-03 DIAGNOSIS — Z23 Encounter for immunization: Secondary | ICD-10-CM | POA: Diagnosis not present

## 2013-12-03 DIAGNOSIS — E1143 Type 2 diabetes mellitus with diabetic autonomic (poly)neuropathy: Secondary | ICD-10-CM | POA: Diagnosis present

## 2013-12-03 DIAGNOSIS — E785 Hyperlipidemia, unspecified: Secondary | ICD-10-CM | POA: Diagnosis present

## 2013-12-03 DIAGNOSIS — R0603 Acute respiratory distress: Secondary | ICD-10-CM

## 2013-12-03 DIAGNOSIS — Z91041 Radiographic dye allergy status: Secondary | ICD-10-CM

## 2013-12-03 DIAGNOSIS — Z888 Allergy status to other drugs, medicaments and biological substances status: Secondary | ICD-10-CM

## 2013-12-03 DIAGNOSIS — R0902 Hypoxemia: Secondary | ICD-10-CM | POA: Diagnosis present

## 2013-12-03 DIAGNOSIS — D631 Anemia in chronic kidney disease: Secondary | ICD-10-CM | POA: Diagnosis present

## 2013-12-03 DIAGNOSIS — E662 Morbid (severe) obesity with alveolar hypoventilation: Secondary | ICD-10-CM | POA: Diagnosis present

## 2013-12-03 DIAGNOSIS — N390 Urinary tract infection, site not specified: Secondary | ICD-10-CM | POA: Diagnosis not present

## 2013-12-03 DIAGNOSIS — E1121 Type 2 diabetes mellitus with diabetic nephropathy: Secondary | ICD-10-CM | POA: Diagnosis present

## 2013-12-03 DIAGNOSIS — I509 Heart failure, unspecified: Secondary | ICD-10-CM

## 2013-12-03 DIAGNOSIS — F319 Bipolar disorder, unspecified: Secondary | ICD-10-CM | POA: Diagnosis present

## 2013-12-03 DIAGNOSIS — B9620 Unspecified Escherichia coli [E. coli] as the cause of diseases classified elsewhere: Secondary | ICD-10-CM | POA: Diagnosis not present

## 2013-12-03 DIAGNOSIS — J810 Acute pulmonary edema: Secondary | ICD-10-CM

## 2013-12-03 DIAGNOSIS — I252 Old myocardial infarction: Secondary | ICD-10-CM

## 2013-12-03 DIAGNOSIS — Z7982 Long term (current) use of aspirin: Secondary | ICD-10-CM

## 2013-12-03 DIAGNOSIS — Z794 Long term (current) use of insulin: Secondary | ICD-10-CM | POA: Diagnosis not present

## 2013-12-03 DIAGNOSIS — N179 Acute kidney failure, unspecified: Secondary | ICD-10-CM | POA: Diagnosis present

## 2013-12-03 HISTORY — DX: Morbid (severe) obesity due to excess calories: E66.01

## 2013-12-03 HISTORY — DX: Hypertensive heart disease without heart failure: I11.9

## 2013-12-03 LAB — CBG MONITORING, ED
GLUCOSE-CAPILLARY: 501 mg/dL — AB (ref 70–99)
Glucose-Capillary: 440 mg/dL — ABNORMAL HIGH (ref 70–99)
Glucose-Capillary: 469 mg/dL — ABNORMAL HIGH (ref 70–99)

## 2013-12-03 LAB — URINALYSIS, ROUTINE W REFLEX MICROSCOPIC
Bilirubin Urine: NEGATIVE
GLUCOSE, UA: 100 mg/dL — AB
KETONES UR: NEGATIVE mg/dL
Nitrite: NEGATIVE
PROTEIN: 100 mg/dL — AB
Specific Gravity, Urine: 1.01 (ref 1.005–1.030)
Urobilinogen, UA: 0.2 mg/dL (ref 0.0–1.0)
pH: 5.5 (ref 5.0–8.0)

## 2013-12-03 LAB — URINE MICROSCOPIC-ADD ON

## 2013-12-03 LAB — BASIC METABOLIC PANEL
ANION GAP: 19 — AB (ref 5–15)
BUN: 88 mg/dL — AB (ref 6–23)
CHLORIDE: 94 meq/L — AB (ref 96–112)
CO2: 20 mEq/L (ref 19–32)
Calcium: 8.7 mg/dL (ref 8.4–10.5)
Creatinine, Ser: 2.52 mg/dL — ABNORMAL HIGH (ref 0.50–1.10)
GFR calc Af Amer: 26 mL/min — ABNORMAL LOW (ref >90)
GFR calc non Af Amer: 22 mL/min — ABNORMAL LOW (ref >90)
Glucose, Bld: 415 mg/dL — ABNORMAL HIGH (ref 70–99)
Potassium: 3.6 mEq/L — ABNORMAL LOW (ref 3.7–5.3)
Sodium: 133 mEq/L — ABNORMAL LOW (ref 137–147)

## 2013-12-03 LAB — COMPREHENSIVE METABOLIC PANEL
ALBUMIN: 3.2 g/dL — AB (ref 3.5–5.2)
ALT: 19 U/L (ref 0–35)
AST: 18 U/L (ref 0–37)
Alkaline Phosphatase: 166 U/L — ABNORMAL HIGH (ref 39–117)
Anion gap: 21 — ABNORMAL HIGH (ref 5–15)
BUN: 91 mg/dL — ABNORMAL HIGH (ref 6–23)
CALCIUM: 9 mg/dL (ref 8.4–10.5)
CO2: 18 mEq/L — ABNORMAL LOW (ref 19–32)
Chloride: 94 mEq/L — ABNORMAL LOW (ref 96–112)
Creatinine, Ser: 2.55 mg/dL — ABNORMAL HIGH (ref 0.50–1.10)
GFR calc non Af Amer: 22 mL/min — ABNORMAL LOW (ref >90)
GFR, EST AFRICAN AMERICAN: 25 mL/min — AB (ref >90)
GLUCOSE: 460 mg/dL — AB (ref 70–99)
Potassium: 3.6 mEq/L — ABNORMAL LOW (ref 3.7–5.3)
SODIUM: 133 meq/L — AB (ref 137–147)
Total Bilirubin: 0.5 mg/dL (ref 0.3–1.2)
Total Protein: 8 g/dL (ref 6.0–8.3)

## 2013-12-03 LAB — GLUCOSE, CAPILLARY
GLUCOSE-CAPILLARY: 375 mg/dL — AB (ref 70–99)
GLUCOSE-CAPILLARY: 328 mg/dL — AB (ref 70–99)
GLUCOSE-CAPILLARY: 170 mg/dL — AB (ref 70–99)
Glucose-Capillary: 284 mg/dL — ABNORMAL HIGH (ref 70–99)
Glucose-Capillary: 247 mg/dL — ABNORMAL HIGH (ref 70–99)

## 2013-12-03 LAB — I-STAT TROPONIN, ED: TROPONIN I, POC: 0.04 ng/mL (ref 0.00–0.08)

## 2013-12-03 LAB — HEMOGLOBIN A1C
Hgb A1c MFr Bld: 9.8 % — ABNORMAL HIGH (ref <5.7)
MEAN PLASMA GLUCOSE: 235 mg/dL — AB (ref <117)

## 2013-12-03 LAB — PRO B NATRIURETIC PEPTIDE: Pro B Natriuretic peptide (BNP): 2451 pg/mL — ABNORMAL HIGH (ref 0–125)

## 2013-12-03 LAB — MRSA PCR SCREENING: MRSA by PCR: POSITIVE — AB

## 2013-12-03 LAB — CBC
HEMATOCRIT: 26.3 % — AB (ref 36.0–46.0)
HEMATOCRIT: 27.3 % — AB (ref 36.0–46.0)
Hemoglobin: 8.7 g/dL — ABNORMAL LOW (ref 12.0–15.0)
Hemoglobin: 9 g/dL — ABNORMAL LOW (ref 12.0–15.0)
MCH: 28.3 pg (ref 26.0–34.0)
MCH: 28.2 pg (ref 26.0–34.0)
MCHC: 33.1 g/dL (ref 30.0–36.0)
MCHC: 33 g/dL (ref 30.0–36.0)
MCV: 85.7 fL (ref 78.0–100.0)
MCV: 85.6 fL (ref 78.0–100.0)
Platelets: 338 10*3/uL (ref 150–400)
Platelets: 348 10*3/uL (ref 150–400)
RBC: 3.07 MIL/uL — AB (ref 3.87–5.11)
RBC: 3.19 MIL/uL — ABNORMAL LOW (ref 3.87–5.11)
RDW: 14.6 % (ref 11.5–15.5)
RDW: 14.5 % (ref 11.5–15.5)
WBC: 14.9 10*3/uL — AB (ref 4.0–10.5)
WBC: 15.2 10*3/uL — ABNORMAL HIGH (ref 4.0–10.5)

## 2013-12-03 LAB — D-DIMER, QUANTITATIVE (NOT AT ARMC): D-Dimer, Quant: 1.9 ug/mL-FEU — ABNORMAL HIGH (ref 0.00–0.48)

## 2013-12-03 LAB — TSH: TSH: 3.48 u[IU]/mL (ref 0.350–4.500)

## 2013-12-03 LAB — PREGNANCY, URINE: Preg Test, Ur: NEGATIVE

## 2013-12-03 LAB — PROTIME-INR
INR: 1.16 (ref 0.00–1.49)
Prothrombin Time: 14.9 seconds (ref 11.6–15.2)

## 2013-12-03 MED ORDER — ALBUTEROL SULFATE (2.5 MG/3ML) 0.083% IN NEBU: 3.0000 mL | INHALATION_SOLUTION | Freq: Four times a day (QID) | RESPIRATORY_TRACT | Status: DC | PRN

## 2013-12-03 MED ORDER — FUROSEMIDE 10 MG/ML IJ SOLN
160.0000 mg | Freq: Two times a day (BID) | INTRAVENOUS | Status: DC
  Administered 2013-12-03 – 2013-12-04 (×4): 160 mg via INTRAVENOUS
  Filled 2013-12-03 (×7): qty 16

## 2013-12-03 MED ORDER — AMLODIPINE BESYLATE 10 MG PO TABS
10.0000 mg | ORAL_TABLET | Freq: Every day | ORAL | Status: DC
  Administered 2013-12-03 – 2013-12-07 (×5): 10 mg via ORAL
  Filled 2013-12-03 (×4): qty 1
  Filled 2013-12-03: qty 2
  Filled 2013-12-03: qty 1

## 2013-12-03 MED ORDER — SODIUM CHLORIDE 0.9 % IV SOLN
20.0000 mL | INTRAVENOUS | Status: DC
  Administered 2013-12-03: 13:00:00 via INTRAVENOUS

## 2013-12-03 MED ORDER — ACETAMINOPHEN 325 MG PO TABS: 650.0000 mg | ORAL_TABLET | ORAL | Status: DC | PRN

## 2013-12-03 MED ORDER — TRAMADOL HCL 50 MG PO TABS
50.0000 mg | ORAL_TABLET | Freq: Two times a day (BID) | ORAL | Status: DC | PRN
  Administered 2013-12-03 – 2013-12-05 (×4): 50 mg via ORAL
  Filled 2013-12-03 (×4): qty 1

## 2013-12-03 MED ORDER — ENOXAPARIN SODIUM 30 MG/0.3ML ~~LOC~~ SOLN
30.0000 mg | SUBCUTANEOUS | Status: DC
Start: 2013-12-03 — End: 2013-12-04
  Administered 2013-12-03 – 2013-12-04 (×2): 30 mg via SUBCUTANEOUS
  Filled 2013-12-03 (×2): qty 0.3

## 2013-12-03 MED ORDER — ONDANSETRON HCL 4 MG/2ML IJ SOLN: 4.0000 mg | Freq: Four times a day (QID) | INTRAMUSCULAR | Status: DC | PRN

## 2013-12-03 MED ORDER — SODIUM CHLORIDE 0.9 % IV SOLN: 250.0000 mL | INTRAVENOUS | Status: DC | PRN

## 2013-12-03 MED ORDER — ASPIRIN 81 MG PO CHEW
81.0000 mg | CHEWABLE_TABLET | Freq: Every day | ORAL | Status: DC
  Administered 2013-12-03 – 2013-12-12 (×10): 81 mg via ORAL
  Filled 2013-12-03 (×11): qty 1

## 2013-12-03 MED ORDER — ACETAMINOPHEN 500 MG PO TABS: 500.0000 mg | ORAL_TABLET | Freq: Four times a day (QID) | ORAL | Status: DC | PRN

## 2013-12-03 MED ORDER — ALBUTEROL SULFATE HFA 108 (90 BASE) MCG/ACT IN AERS: 2.0000 | INHALATION_SPRAY | Freq: Four times a day (QID) | RESPIRATORY_TRACT | Status: DC | PRN

## 2013-12-03 MED ORDER — ASPIRIN 325 MG PO TABS
325.0000 mg | ORAL_TABLET | Freq: Once | ORAL | Status: AC
  Administered 2013-12-03: 325 mg via ORAL
  Filled 2013-12-03: qty 1

## 2013-12-03 MED ORDER — DEXTROSE-NACL 5-0.45 % IV SOLN
INTRAVENOUS | Status: DC
  Administered 2013-12-03: 20:00:00 via INTRAVENOUS

## 2013-12-03 MED ORDER — SODIUM CHLORIDE 0.9 % IJ SOLN: 3.0000 mL | INTRAMUSCULAR | Status: DC | PRN

## 2013-12-03 MED ORDER — NITROGLYCERIN 0.4 MG SL SUBL
0.4000 mg | SUBLINGUAL_TABLET | SUBLINGUAL | Status: DC | PRN
  Administered 2013-12-03 (×2): 0.4 mg via SUBLINGUAL
  Filled 2013-12-03: qty 1

## 2013-12-03 MED ORDER — L-METHYLFOLATE-B6-B12 3-35-2 MG PO TABS
1.0000 | ORAL_TABLET | Freq: Two times a day (BID) | ORAL | Status: DC
  Administered 2013-12-03 – 2013-12-12 (×19): 1 via ORAL
  Filled 2013-12-03 (×20): qty 1

## 2013-12-03 MED ORDER — CETYLPYRIDINIUM CHLORIDE 0.05 % MT LIQD
7.0000 mL | Freq: Two times a day (BID) | OROMUCOSAL | Status: DC
  Administered 2013-12-03 – 2013-12-12 (×17): 7 mL via OROMUCOSAL

## 2013-12-03 MED ORDER — INSULIN ASPART 100 UNIT/ML ~~LOC~~ SOLN: 0.0000 [IU] | Freq: Three times a day (TID) | SUBCUTANEOUS | Status: DC

## 2013-12-03 MED ORDER — HYDROCODONE-ACETAMINOPHEN 5-325 MG PO TABS
1.0000 | ORAL_TABLET | Freq: Once | ORAL | Status: AC
  Administered 2013-12-03: 1 via ORAL
  Filled 2013-12-03: qty 1

## 2013-12-03 MED ORDER — METOCLOPRAMIDE HCL 10 MG PO TABS
10.0000 mg | ORAL_TABLET | Freq: Three times a day (TID) | ORAL | Status: DC
  Administered 2013-12-03 – 2013-12-12 (×25): 10 mg via ORAL
  Filled 2013-12-03 (×31): qty 1

## 2013-12-03 MED ORDER — METOLAZONE 5 MG PO TABS
5.0000 mg | ORAL_TABLET | Freq: Once | ORAL | Status: AC
  Administered 2013-12-03: 5 mg via ORAL
  Filled 2013-12-03: qty 1

## 2013-12-03 MED ORDER — INSULIN ASPART 100 UNIT/ML ~~LOC~~ SOLN
15.0000 [IU] | Freq: Three times a day (TID) | SUBCUTANEOUS | Status: DC
  Filled 2013-12-03 (×2): qty 1

## 2013-12-03 MED ORDER — MUPIROCIN 2 % EX OINT
1.0000 | TOPICAL_OINTMENT | Freq: Two times a day (BID) | CUTANEOUS | Status: AC
Start: 2013-12-03 — End: 2013-12-08
  Administered 2013-12-03 – 2013-12-08 (×10): 1 via NASAL
  Filled 2013-12-03: qty 22

## 2013-12-03 MED ORDER — CARVEDILOL 6.25 MG PO TABS
9.3750 mg | ORAL_TABLET | Freq: Two times a day (BID) | ORAL | Status: DC
  Administered 2013-12-03 – 2013-12-04 (×3): 9.375 mg via ORAL
  Filled 2013-12-03 (×6): qty 1

## 2013-12-03 MED ORDER — INSULIN GLARGINE 100 UNIT/ML ~~LOC~~ SOLN
60.0000 [IU] | Freq: Every day | SUBCUTANEOUS | Status: DC
  Administered 2013-12-03 – 2013-12-09 (×7): 60 [IU] via SUBCUTANEOUS
  Filled 2013-12-03 (×8): qty 0.6

## 2013-12-03 MED ORDER — INFLUENZA VAC SPLIT QUAD 0.5 ML IM SUSY
0.5000 mL | PREFILLED_SYRINGE | INTRAMUSCULAR | Status: AC
  Administered 2013-12-04: 0.5 mL via INTRAMUSCULAR
  Filled 2013-12-03: qty 0.5

## 2013-12-03 MED ORDER — FUROSEMIDE 10 MG/ML IJ SOLN
80.0000 mg | Freq: Once | INTRAMUSCULAR | Status: AC
  Administered 2013-12-03: 80 mg via INTRAVENOUS
  Filled 2013-12-03: qty 8

## 2013-12-03 MED ORDER — SODIUM CHLORIDE 0.9 % IV SOLN
INTRAVENOUS | Status: DC
  Administered 2013-12-03: 4.4 [IU]/h via INTRAVENOUS
  Filled 2013-12-03: qty 2.5

## 2013-12-03 MED ORDER — SODIUM CHLORIDE 0.9 % IJ SOLN
3.0000 mL | Freq: Two times a day (BID) | INTRAMUSCULAR | Status: DC
  Administered 2013-12-04 – 2013-12-11 (×16): 3 mL via INTRAVENOUS

## 2013-12-03 MED ORDER — DULOXETINE HCL 60 MG PO CPEP
60.0000 mg | ORAL_CAPSULE | Freq: Every day | ORAL | Status: DC
  Administered 2013-12-03 – 2013-12-12 (×10): 60 mg via ORAL
  Filled 2013-12-03 (×10): qty 1

## 2013-12-03 MED ORDER — ATORVASTATIN CALCIUM 80 MG PO TABS
80.0000 mg | ORAL_TABLET | Freq: Every day | ORAL | Status: DC
  Administered 2013-12-03 – 2013-12-11 (×9): 80 mg via ORAL
  Filled 2013-12-03 (×11): qty 1

## 2013-12-03 MED ORDER — ASPIRIN 81 MG PO TABS: 81.0000 mg | ORAL_TABLET | Freq: Every day | ORAL | Status: DC

## 2013-12-03 MED ORDER — CLOPIDOGREL BISULFATE 75 MG PO TABS
75.0000 mg | ORAL_TABLET | Freq: Every day | ORAL | Status: DC
  Administered 2013-12-03 – 2013-12-12 (×10): 75 mg via ORAL
  Filled 2013-12-03 (×11): qty 1

## 2013-12-03 MED ORDER — INSULIN GLARGINE 100 UNIT/ML ~~LOC~~ SOLN
60.0000 [IU] | Freq: Every day | SUBCUTANEOUS | Status: DC
  Filled 2013-12-03: qty 0.6

## 2013-12-03 MED ORDER — CHLORHEXIDINE GLUCONATE CLOTH 2 % EX PADS
6.0000 | MEDICATED_PAD | Freq: Every day | CUTANEOUS | Status: AC
  Administered 2013-12-05 – 2013-12-08 (×4): 6 via TOPICAL

## 2013-12-03 NOTE — ED Notes (Signed)
Ally, NP at bedside 

## 2013-12-03 NOTE — ED Notes (Signed)
Meal tray ordered 

## 2013-12-03 NOTE — ED Notes (Signed)
Patient transported to X-ray 

## 2013-12-03 NOTE — ED Provider Notes (Signed)
CSN: [id number]     Arrival date & time 12/03/13  0254 History   First MD Initiated Contact with Patient 12/03/13 [phone]     Chief Complaint  Patient presents with  . Shortness of Breath     (Consider location/radiation/quality/duration/timing/severity/associated sxs/prior Treatment) HPI  Jasmine [name] is a 43 y.o. female with past medical history of CHF, EF 55%, hypertension, chronic kidney disease, hyperlipidemia, diabetes, coronary artery disease presenting today with shortness of breath. Patient states her breathing has gotten worse over the past several days. He describes worsening sleep orthopnea. She has been compliant with her torsemide medication. She had worsening dyspnea on exertion, she denies any worsening swelling.  She has not had any chest pain or vomiting or sweating. Patient states she feels as if more fluid is on her lungs. She denies any history of asthma or COPD. Patient has no further complaints.  10 Systems reviewed and are negative for acute change except as noted in the HPI.    Past Medical History  Diagnosis Date  . Diastolic heart failure     a. EF 55-60%, grade II DD (05/2013)  . Hypertension   . Chronic kidney disease (CKD)     stage II-III  . Hyperlipidemia   . Uncontrolled diabetes mellitus   . Diabetic ketoacidosis   . Anemia   . Diabetic gastroparesis   . Obesity   . Coronary artery disease      a. Cath 2010: non obstructive CAD (30% LAD proximal, mid and distal lesions, 60% diagonal, 30% Dominant mid circumflex) b. Lexiscan Myoview (05/2013) Fixed anteroapical scar of medium sixe, no reversible ischemia  . Pneumonia due to unspecified Streptococcus 10/16/2012  . Acute on chronic diastolic heart failure 01/18/2012  . Acute respiratory failure with hypoxia 10/08/2012  . Type II diabetes mellitus with ophthalmic manifestations   . Bipolar 1 disorder   . Congestive heart failure   . Diabetic nephropathy   . Septicemia   . Acute myocardial infarction    . Subendocardial infarction   . Abscess or cellulitis of gluteal region   . Gastroparesis   . Depressive disorder   . Cough   . Chronic kidney disease (CKD), stage IV (severe)   . Carpal tunnel syndrome   . Pain in soft tissues of limb   . Allergic rhinitis   . Acute renal failure   . Acute respiratory failure   . Abscess of anal and rectal regions   . Abdominal pain    Past Surgical History  Procedure Laterality Date  . Cholecystectomy    . Carpal tunnel release  2003   Family History  Problem Relation Age of Onset  . Heart attack Mother   . Dementia Father    History  Substance Use Topics  . Smoking status: Never Smoker   . Smokeless tobacco: Never Used  . Alcohol Use: Yes     Comment: occasionally   OB History    No data available     Review of Systems    Allergies  Contrast media; Paroxetine; and Oxycodone  Home Medications   Prior to Admission medications   Medication Sig Start Date End Date Taking? Authorizing Provider  acetaminophen (TYLENOL) 500 MG tablet Take 500 mg by mouth every 6 (six) hours as needed for mild pain.   Yes Historical Provider, MD  amLODipine (NORVASC) 10 MG tablet Take 10 mg by mouth daily.   Yes Historical Provider, MD  aspirin 81 MG tablet Take 81 mg by mouth  daily. For pain   Yes Historical Provider, MD  atorvastatin (LIPITOR) 80 MG tablet Take 1 tablet (80 mg total) by mouth daily at 6 PM. 05/30/13  Yes Estela Isaiah Blakes, MD  carvedilol (COREG) 6.25 MG tablet Take 1.5 tablets (9.375 mg total) by mouth 2 (two) times daily with a meal. 08/29/13  Yes Amy D Clegg, NP  clopidogrel (PLAVIX) 75 MG tablet Take 1 tablet (75 mg total) by mouth daily with breakfast. 10/10/13  Yes Amy D Clegg, NP  DULoxetine (CYMBALTA) 60 MG capsule Take 1 capsule (60 mg total) by mouth daily. 10/03/13  Yes Anson Fret, MD  ferrous sulfate 325 (65 FE) MG tablet Take 325 mg by mouth 2 (two) times daily.     Yes Historical Provider, MD  insulin glargine  (LANTUS) 100 UNIT/ML injection Inject 60 Units into the skin at bedtime.   Yes Historical Provider, MD  insulin lispro (HUMALOG) 100 UNIT/ML injection Inject 15-30 Units into the skin 3 (three) times daily before meals. Sliding scale   Yes Historical Provider, MD  l-methylfolate-B6-B12 (METANX) 3-35-2 MG TABS Take 1 tablet by mouth 2 (two) times daily. 10/03/13  Yes Anson Fret, MD  metoCLOPramide (REGLAN) 10 MG tablet Take 10 mg by mouth 3 (three) times daily before meals. One po qac 09/06/13  Yes Historical Provider, MD  metolazone (ZAROXOLYN) 2.5 MG tablet TAKE 1 TABLET (2.5 MG TOTAL) BY MOUTH AS DIRECTED. 11/12/13  Yes Dolores Patty, MD  metoprolol tartrate (LOPRESSOR) 25 MG tablet Take 25 mg by mouth daily.  05/30/13  Yes Historical Provider, MD  torsemide (DEMADEX) 20 MG tablet Take 4 tablets (80 mg total) by mouth 2 (two) times daily. 09/25/13  Yes Dolores Patty, MD  albuterol (PROVENTIL HFA;VENTOLIN HFA) 108 (90 BASE) MCG/ACT inhaler Inhale 2 puffs into the lungs every 6 (six) hours as needed for wheezing. 10/25/12   Amy D Clegg, NP  capsicum (ZOSTRIX) 0.075 % topical cream Apply 1 application topically 3 (three) times daily. Apply to feet up to 3x daily 10/03/13   Anson Fret, MD  HYDROcodone-acetaminophen (NORCO) 5-325 MG per tablet Take 1 tablet by mouth every 6 (six) hours as needed for moderate pain. 04/13/13   Penny Pia, MD  insulin glargine (LANTUS) 100 UNIT/ML injection Inject 50 Units into the skin at bedtime.    Historical Provider, MD  insulin lispro (HUMALOG) 100 UNIT/ML injection Inject 12 Units into the skin 3 (three) times daily before meals.    Historical Provider, MD  metolazone (ZAROXOLYN) 2.5 MG tablet TAKE 1 TABLET (2.5 MG TOTAL) BY MOUTH AS DIRECTED. 11/10/13   Brittainy Sherlynn Carbon, PA-C  Prenatal Vit-Fe Fumarate-FA (PRENATAL MULTIVITAMIN) TABS tablet Take 1 tablet by mouth daily at 12 noon.    Historical Provider, MD   BP 148/62 mmHg  Pulse 93  Temp(Src) 97.9  F (36.6 C) (Oral)  Resp 21  Ht 5\' 7"  (1.702 m)  Wt 283 lb (128.368 kg)  BMI 44.31 kg/m2  SpO2 90% Physical Exam  Constitutional: She is oriented to person, place, and time. She appears well-developed and well-nourished. She appears distressed.  HENT:  Head: Normocephalic and atraumatic.  Nose: Nose normal.  Mouth/Throat: Oropharynx is clear and moist.  Eyes: Conjunctivae and EOM are normal. Pupils are equal, round, and reactive to light. No scleral icterus.  Neck: Normal range of motion. Neck supple. No JVD present. No tracheal deviation present. No thyromegaly present.  Cardiovascular: Normal rate, regular rhythm and normal heart sounds.  Exam reveals no gallop and no friction rub.   No murmur heard. Pulmonary/Chest: She is in respiratory distress. She has no wheezes. She exhibits no tenderness.  Decreased air entry bilaterally possibly second to body habitus. There is tachypnea and respiratory distress. No use of accessory muscles.  Abdominal: Soft. Bowel sounds are normal. She exhibits no distension and no mass. There is no tenderness. There is no rebound and no guarding.  Musculoskeletal: Normal range of motion. She exhibits edema. She exhibits no tenderness.  Left lower extremity edema 2+.  Lymphadenopathy:    She has no cervical adenopathy.  Neurological: She is alert and oriented to person, place, and time. No cranial nerve deficit. She exhibits normal muscle tone.  Skin: Skin is warm and dry. No rash noted. No erythema. No pallor.  Nursing note and vitals reviewed.   ED Course  Procedures (including critical care time) Labs Review Labs Reviewed  CBC - Abnormal; Notable for the following:    WBC 14.9 (*)    RBC 3.07 (*)    Hemoglobin 8.7 (*)    HCT 26.3 (*)    All other components within normal limits  BASIC METABOLIC PANEL - Abnormal; Notable for the following:    Sodium 133 (*)    Potassium 3.6 (*)    Chloride 94 (*)    Glucose, Bld 415 (*)    BUN 88 (*)     Creatinine, Ser 2.52 (*)    GFR calc non Af Amer 22 (*)    GFR calc Af Amer 26 (*)    Anion gap 19 (*)    All other components within normal limits  PRO B NATRIURETIC PEPTIDE - Abnormal; Notable for the following:    Pro B Natriuretic peptide (BNP) 2451.0 (*)    All other components within normal limits  URINALYSIS, ROUTINE W REFLEX MICROSCOPIC - Abnormal; Notable for the following:    APPearance CLOUDY (*)    Glucose, UA 100 (*)    Hgb urine dipstick TRACE (*)    Protein, ur 100 (*)    Leukocytes, UA SMALL (*)    All other components within normal limits  URINE MICROSCOPIC-ADD ON - Abnormal; Notable for the following:    Squamous Epithelial / LPF MANY (*)    Bacteria, UA MANY (*)    Casts HYALINE CASTS (*)    All other components within normal limits  PROTIME-INR  PREGNANCY, URINE  I-STAT TROPOININ, ED    Imaging Review Dg Chest 2 View  12/03/2013   CLINICAL DATA:  Shortness of breath.  EXAM: CHEST  2 VIEW  COMPARISON:  Single view of the chest 03/24/2013, 04/01/2013 and 03/24/2013.  FINDINGS: Bilateral airspace disease has an appearance most consistent with pulmonary edema. Heart size is upper normal. No pleural effusion or pneumothorax.  IMPRESSION: Bilateral airspace disease has an appearance most consistent with pulmonary edema.   Electronically Signed   By: Drusilla Kannerhomas  Dalessio M.D.   On: 12/03/2013 03:56     EKG Interpretation   Date/Time:  Monday December 03 2013 03:03:55 EST Ventricular Rate:  92 PR Interval:  193 QRS Duration: 94 QT Interval:  407 QTC Calculation: 503 R Axis:   55 Text Interpretation:  Sinus rhythm Anterior infarct, old Borderline ST  depression, diffuse leads No significant change since last tracing  Confirmed by Erroll Lunani, Hang Ammon Ayokunle [id number]) on 12/03/2013 3:21:25 AM      MDM   Final diagnoses:  SOB (shortness of breath)    Patient presents to the  emergency department out of concern for shortness of breath. Her history appears consistent with  fluid overload. Patient is hypoxic to 87% on room air. She denies wearing oxygen at home. She was placed on oxygen here, given Lasix and nitroglycerin for treatment. Will obtain labs chest x-ray and likely consult cardiology for admission.  BNP elevated at 2000. Chest x-ray reveals pulmonary edema.  Patient has normal O2 sat with nasal cocaine applied. Spoke with cardiology will omit the patient for continued treatment.    Tomasita CrumbleAdeleke Baily Serpe, MD 12/03/13 0530

## 2013-12-03 NOTE — ED Notes (Signed)
Phone communication with PA.

## 2013-12-03 NOTE — ED Notes (Signed)
Per [facility] pt reports SOB and dry cough starting Wednesday. EMS reported rales and crackles noted to Right lung. 18 G SL tp LFA

## 2013-12-03 NOTE — H&P (Signed)
History and Physical   Admit date: 12/03/2013 Name:  Jasmine [name] Medical record number: [id number] DOB/Age:  1970-04-16  43 y.o. female  [facility]   [facility]  Primary Cardiologist: Dr. Gala RomneyBensimhon  Chief complaint/reason for admission: Shortness of breath  HPI:  43 year old female brought in with pulmonary edema and severe shortness of breath. 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 She was last seenin September in the clinic. She has not been weighing daily and has noticed a cough since last Wednesday. She became short of breath the night before last and was short of breath yesterday but became more acutely short of breath this evening. She taken some ibuprofen the few days before for shoulder pain. She has not really been weighing. She presented and was found to be hypoxemic and dyspneic and complained of pleuritic chest pain and is admitted at this time for further treatment.    Past Medical History  Diagnosis Date  . Diastolic heart failure     a. EF 55-60%, grade II DD (05/2013)  . Hypertensive heart disease   . Chronic kidney disease (CKD) stage G4/A1, severely decreased glomerular filtration rate (GFR) between 15-29 mL/min/1.73 square meter and albuminuria creatinine ratio less than 30 mg/g     stage II-III  . Hyperlipidemia   . 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 Anemia   . Diabetic gastroparesis   . Morbid obesity   . Coronary artery disease      a. Cath 2010: non obstructive CAD (30% LAD proximal, mid and distal lesions, 60% diagonal, 30% Dominant mid circumflex) b. Lexiscan Myoview (05/2013) Fixed anteroapical scar of medium sixe, no reversible ischemia  . Pneumonia due to unspecified Streptococcus 10/16/2012  .  Type II diabetes mellitus with ophthalmic manifestations   . Bipolar 1 disorder   . Diabetic nephropathy   . Subendocardial infarction   . Gastroparesis   . Depressive disorder   . Cough   . Chronic kidney disease (CKD), stage IV (severe)   . Carpal tunnel syndrome   . Pain in soft tissues of limb   . Allergic rhinitis   . Acute respiratory failure   . Abscess of anal and rectal regions      Past Surgical History  Procedure Laterality Date  . Cholecystectomy    . Carpal tunnel release  2003   Allergies: is allergic to contrast media; paroxetine; and oxycodone.   Medications: Prior to Admission medications   Medication Sig Start Date End Date Taking? Authorizing Provider  acetaminophen (TYLENOL) 500 MG tablet Take 500 mg by mouth every 6 (six) hours as needed for mild pain.   Yes Historical Provider, MD  amLODipine (NORVASC) 10 MG tablet Take 10 mg by mouth daily.   Yes Historical Provider, MD  aspirin 81 MG tablet Take 81 mg by mouth daily. For pain   Yes Historical Provider, MD  atorvastatin (LIPITOR) 80 MG tablet Take 1 tablet (80 mg total) by mouth daily at 6 PM. 05/30/13  Yes Estela Isaiah BlakesY Hernandez Acosta, MD  carvedilol (COREG) 6.25 MG tablet Take 1.5 tablets (9.375 mg total) by mouth 2 (two) times daily with a meal. 08/29/13  Yes Amy D Clegg, NP  clopidogrel (PLAVIX) 75 MG tablet Take 1 tablet (75 mg total) by mouth daily with breakfast. 10/10/13  Yes  Amy D Filbert Schilder, NP  DULoxetine (CYMBALTA) 60 MG capsule Take 1 capsule (60 mg total) by mouth daily. 10/03/13  Yes Anson Fret, MD  ferrous sulfate 325 (65 FE) MG tablet Take 325 mg by mouth 2 (two) times daily.     Yes Historical Provider, MD  insulin glargine (LANTUS) 100 UNIT/ML injection Inject 60 Units into the skin at bedtime.   Yes Historical Provider, MD  insulin lispro (HUMALOG) 100 UNIT/ML injection Inject 15-30 Units into the skin 3 (three) times daily before meals. Sliding scale   Yes Historical Provider, MD   l-methylfolate-B6-B12 (METANX) 3-35-2 MG TABS Take 1 tablet by mouth 2 (two) times daily. 10/03/13  Yes Anson Fret, MD  metoCLOPramide (REGLAN) 10 MG tablet Take 10 mg by mouth 3 (three) times daily before meals. One po qac 09/06/13  Yes Historical Provider, MD  metolazone (ZAROXOLYN) 2.5 MG tablet TAKE 1 TABLET (2.5 MG TOTAL) BY MOUTH AS DIRECTED. 11/12/13  Yes Dolores Patty, MD  metoprolol tartrate (LOPRESSOR) 25 MG tablet Take 25 mg by mouth daily.  05/30/13  Yes Historical Provider, MD  torsemide (DEMADEX) 20 MG tablet Take 4 tablets (80 mg total) by mouth 2 (two) times daily. 09/25/13  Yes Dolores Patty, MD  albuterol (PROVENTIL HFA;VENTOLIN HFA) 108 (90 BASE) MCG/ACT inhaler Inhale 2 puffs into the lungs every 6 (six) hours as needed for wheezing. 10/25/12   Amy D Clegg, NP  capsicum (ZOSTRIX) 0.075 % topical cream Apply 1 application topically 3 (three) times daily. Apply to feet up to 3x daily 10/03/13   Anson Fret, MD  HYDROcodone-acetaminophen (NORCO) 5-325 MG per tablet Take 1 tablet by mouth every 6 (six) hours as needed for moderate pain. 04/13/13   Penny Pia, MD  insulin glargine (LANTUS) 100 UNIT/ML injection Inject 50 Units into the skin at bedtime.    Historical Provider, MD  insulin lispro (HUMALOG) 100 UNIT/ML injection Inject 12 Units into the skin 3 (three) times daily before meals.    Historical Provider, MD  metolazone (ZAROXOLYN) 2.5 MG tablet TAKE 1 TABLET (2.5 MG TOTAL) BY MOUTH AS DIRECTED. 11/10/13   Brittainy Sherlynn Carbon, PA-C  Prenatal Vit-Fe Fumarate-FA (PRENATAL MULTIVITAMIN) TABS tablet Take 1 tablet by mouth daily at 12 noon.    Historical Provider, MD    Family History:  Family Status  Relation Status Death Age  . Mother Deceased 20    from [place]  . Father Alive     Social History:   reports that she has never smoked. She has never used smokeless tobacco. She reports that she drinks alcohol. She reports that she does not use illicit drugs.    History   Social History Narrative   Patient is single with no children.   Patient lives with boyfriend.   Patient is right handed.   Patient has Bachelor's degree.   Patient drinks 1 large cup daily.     Review of Systems:  Some depression and anxiety complains of hurting all over with her feet, shoulders and complains of pleuritic chest pain, some nausea, constipation, somewhat reduced urine output Other than as noted above, the remainder of the review of systems is normal  Physical Exam: BP 163/65 mmHg  Pulse 88  Temp(Src) 97.9 F (36.6 C) (Oral)  Resp 27  Ht 5\' 7"  (1.702 m)  Wt 128.368 kg (283 lb)  BMI 44.31 kg/m2  SpO2 92%  General appearance: very large black female dyspneic and complaining of pain Head: Normocephalic,  without obvious abnormality, atraumatic Eyes: conjunctivae/corneas clear. PERRL, EOM's intact. Fundinot examined Neck: no adenopathy, no carotid bruit, supple, symmetrical, trachea midline and JVD difficult to assess due to obesity Lungs: reduced breath sounds, bilateral crackles Heart: regular rate and rhythm, S1, S2 normal, no murmur, click, rub or gallop Abdomen: soft, non-tender; bowel sounds normal; no masses,  no organomegaly Pelvic: deferred Extremities: 2+ edema Pulses: 2+ and symmetric Neurologic: Grossly normal  Labs: CBC  Recent Labs  12/03/13 0305  WBC 14.9*  RBC 3.07*  HGB 8.7*  HCT 26.3*  PLT 338  MCV 85.7  MCH 28.3  MCHC 33.1  RDW 14.6   CMP   Recent Labs  12/03/13 0305  NA 133*  K 3.6*  CL 94*  CO2 20  GLUCOSE 415*  BUN 88*  CREATININE 2.52*  CALCIUM 8.7  GFRNONAA 22*  GFRAA 26*   BNP (last 3 results)  Recent Labs  03/24/13 1200 05/25/13 0545 12/03/13 0305  PROBNP 6479.0* 3258.0* 2451.0*   Cardiac Panel (last 3 results) Troponin (Point of Care Test)  Recent Labs  12/03/13 0311  TROPIPOC 0.04   Thyroid  Lab Results  Component Value Date   TSH 1.380 10/10/2013    EKG: Sinus rhythm, old  anterior infarction  Radiology: Pulmonary edema   IMPRESSIONS: 1. Acute diastolic congestive heart failure with pulmonary edema-likely worsened due to use of ibuprofen, dietary noncompliance and worsening anemia 2. Stage IV chronic kidney disease 3. Anemia of renal disease currently worsened 4. Diabetes mellitus with renal complications poorly controlled 5. Morbid obesity 6. Pleuritic chest pain rule out PE-check d-dimer 7. Medical noncompliance  PLAN: She will be admitted and placed on high-dose Lasix intravenously. Restrict fluid to 1200 cc, check d-dimer, will need renal consultation while she is in the hospital and diabetic control.  For some reason she is listed as taking both metoprolol and carvedilol and we'll need to review records with pharmacy to be sure she is not been overdosed with medications. Importance of avoiding nonsteroidal anti-inflammatory agents was discussed with patient and husband.  Signed: Darden PalmerW. Spencer Tilley, Jr. MD [facility]  12/03/2013, 5:32 AM

## 2013-12-03 NOTE — Progress Notes (Signed)
ED CM noted CM consult for Heart failure home health screen and may place order for PT/OT eval and treat if indicated. Pt being admitted  Deferred to unit cm

## 2013-12-03 NOTE — Progress Notes (Signed)
Utilization review completed.  

## 2013-12-03 NOTE — ED Notes (Signed)
Ally with cards states that they will be down to see pt and to hold insulin until they see pt.

## 2013-12-03 NOTE — ED Notes (Signed)
This RN spoke to pharmacy and states that IVP lasix is to be given at 10am.

## 2013-12-03 NOTE — ED Notes (Signed)
MD Tilley at bedside.  

## 2013-12-03 NOTE — Progress Notes (Signed)
CRITICAL VALUE ALERT  Critical value received:  MRSA PCR positive  Date of notification:  12/03/13  Time of notification:  1735  Critical value read back:Yes.    Nurse who received alert:  Doree AlbeeIrfa Habib  MD notified (1st page):    Time of first page:    MD notified (2nd page):  Time of second page:  Responding MD:    Time MD responded:

## 2013-12-04 DIAGNOSIS — N179 Acute kidney failure, unspecified: Secondary | ICD-10-CM

## 2013-12-04 DIAGNOSIS — I5033 Acute on chronic diastolic (congestive) heart failure: Secondary | ICD-10-CM

## 2013-12-04 LAB — GLUCOSE, CAPILLARY
GLUCOSE-CAPILLARY: 125 mg/dL — AB (ref 70–99)
GLUCOSE-CAPILLARY: 129 mg/dL — AB (ref 70–99)
GLUCOSE-CAPILLARY: 130 mg/dL — AB (ref 70–99)
GLUCOSE-CAPILLARY: 131 mg/dL — AB (ref 70–99)
GLUCOSE-CAPILLARY: 142 mg/dL — AB (ref 70–99)
GLUCOSE-CAPILLARY: 186 mg/dL — AB (ref 70–99)
Glucose-Capillary: 136 mg/dL — ABNORMAL HIGH (ref 70–99)
Glucose-Capillary: 141 mg/dL — ABNORMAL HIGH (ref 70–99)
Glucose-Capillary: 140 mg/dL — ABNORMAL HIGH (ref 70–99)
Glucose-Capillary: 240 mg/dL — ABNORMAL HIGH (ref 70–99)
Glucose-Capillary: 214 mg/dL — ABNORMAL HIGH (ref 70–99)

## 2013-12-04 LAB — BASIC METABOLIC PANEL
ANION GAP: 20 — AB (ref 5–15)
BUN: 99 mg/dL — AB (ref 6–23)
CO2: 21 mEq/L (ref 19–32)
Calcium: 9.1 mg/dL (ref 8.4–10.5)
Chloride: 96 mEq/L (ref 96–112)
Creatinine, Ser: 3.15 mg/dL — ABNORMAL HIGH (ref 0.50–1.10)
GFR calc Af Amer: 20 mL/min — ABNORMAL LOW (ref >90)
GFR calc non Af Amer: 17 mL/min — ABNORMAL LOW (ref >90)
Glucose, Bld: 134 mg/dL — ABNORMAL HIGH (ref 70–99)
Potassium: 3.1 mEq/L — ABNORMAL LOW (ref 3.7–5.3)
Sodium: 137 mEq/L (ref 137–147)

## 2013-12-04 MED ORDER — INSULIN ASPART 100 UNIT/ML ~~LOC~~ SOLN
0.0000 [IU] | Freq: Three times a day (TID) | SUBCUTANEOUS | Status: DC
  Administered 2013-12-04: 7 [IU] via SUBCUTANEOUS
  Administered 2013-12-04: 4 [IU] via SUBCUTANEOUS
  Administered 2013-12-05 – 2013-12-07 (×4): 3 [IU] via SUBCUTANEOUS
  Administered 2013-12-08 – 2013-12-09 (×3): 4 [IU] via SUBCUTANEOUS
  Administered 2013-12-10 (×2): 3 [IU] via SUBCUTANEOUS
  Administered 2013-12-11 (×2): 4 [IU] via SUBCUTANEOUS

## 2013-12-04 MED ORDER — CARVEDILOL 12.5 MG PO TABS
12.5000 mg | ORAL_TABLET | Freq: Two times a day (BID) | ORAL | Status: DC
  Administered 2013-12-04 – 2013-12-12 (×16): 12.5 mg via ORAL
  Filled 2013-12-04 (×18): qty 1

## 2013-12-04 MED ORDER — POTASSIUM CHLORIDE CRYS ER 20 MEQ PO TBCR
40.0000 meq | EXTENDED_RELEASE_TABLET | Freq: Two times a day (BID) | ORAL | Status: DC
  Administered 2013-12-04 – 2013-12-12 (×16): 40 meq via ORAL
  Filled 2013-12-04 (×18): qty 2

## 2013-12-04 MED ORDER — POTASSIUM CHLORIDE CRYS ER 20 MEQ PO TBCR
40.0000 meq | EXTENDED_RELEASE_TABLET | Freq: Once | ORAL | Status: AC
  Administered 2013-12-04: 40 meq via ORAL
  Filled 2013-12-04: qty 2

## 2013-12-04 MED ORDER — ENOXAPARIN SODIUM 40 MG/0.4ML ~~LOC~~ SOLN
40.0000 mg | SUBCUTANEOUS | Status: DC
  Administered 2013-12-05 – 2013-12-11 (×7): 40 mg via SUBCUTANEOUS
  Filled 2013-12-04 (×8): qty 0.4

## 2013-12-04 MED ORDER — INSULIN ASPART 100 UNIT/ML ~~LOC~~ SOLN
0.0000 [IU] | Freq: Every day | SUBCUTANEOUS | Status: DC
  Administered 2013-12-04 – 2013-12-09 (×2): 2 [IU] via SUBCUTANEOUS
  Administered 2013-12-11: 3 [IU] via SUBCUTANEOUS

## 2013-12-04 NOTE — Progress Notes (Addendum)
Karie MainlandAli, [place]PA with Heart Failure made aware that insulin drip has been on hold and that Glucostabilizer was discontinued per previous shift. Patient's last CBG 129. Karie Mainlandli states she will place new orders for blood sugar checks and insulin coverage. Will continue to monitor.

## 2013-12-04 NOTE — Progress Notes (Signed)
Patient ID: Jasmine [name], female   DOB: 04-Jan-1971, 43 y.o.   MRN: [id number]   SUBJECTIVE: Patient had some diuresis yesterday though weight unchanged (think she was weighed in bed).  Complains of diffuse pain.  BUN/creatinine up again today. She is still short of breath with marginal oxygen saturation on 3 L [place].   Scheduled Meds: . amLODipine  10 mg Oral Daily  . antiseptic oral rinse  7 mL Mouth Rinse BID  . aspirin  81 mg Oral Daily  . atorvastatin  80 mg Oral q1800  . carvedilol  9.375 mg Oral BID WC  . Chlorhexidine Gluconate Cloth  6 each Topical Q0600  . clopidogrel  75 mg Oral Q breakfast  . DULoxetine  60 mg Oral Daily  . enoxaparin (LOVENOX) injection  30 mg Subcutaneous Q24H  . furosemide  160 mg Intravenous BID  . Influenza vac split quadrivalent PF  0.5 mL Intramuscular Tomorrow-1000  . insulin glargine  60 Units Subcutaneous QHS  . l-methylfolate-B6-B12  1 tablet Oral BID  . metoCLOPramide  10 mg Oral TID AC  . mupirocin ointment  1 application Nasal BID  . sodium chloride  3 mL Intravenous Q12H   Continuous Infusions: . sodium chloride Stopped (12/03/13 1935)  . dextrose 5 % and 0.45% NaCl 10 mL/hr at 12/03/13 1935  . insulin (NOVOLIN-R) infusion Stopped (12/04/13 0610)   PRN Meds:.sodium chloride, acetaminophen, albuterol, nitroGLYCERIN, ondansetron (ZOFRAN) IV, sodium chloride, traMADol    Filed Vitals:   12/03/13 1555 12/03/13 2048 12/04/13 0205 12/04/13 0454  BP: 151/81 139/74 144/72 139/59  Pulse: 87 83 84 88  Temp: 97.8 F (36.6 C) 98.5 F (36.9 C)  98.5 F (36.9 C)  TempSrc: Oral Oral  Oral  Resp: 24 18 18 18   Height: 5\' 7"  (1.702 m)     Weight: 289 lb 14.5 oz (131.5 kg)   289 lb 3.2 oz (131.18 kg)  SpO2: 92% 94% 92% 90%    Intake/Output Summary (Last 24 hours) at 12/04/13 0817 Last data filed at 12/04/13 0700  Gross per 24 hour  Intake  421.4 ml  Output   2000 ml  Net -1578.6 ml    LABS: Basic Metabolic Panel:  Recent Labs   12/03/13 0548 12/04/13 0408  NA 133* 137  K 3.6* 3.1*  CL 94* 96  CO2 18* 21  GLUCOSE 460* 134*  BUN 91* 99*  CREATININE 2.55* 3.15*  CALCIUM 9.0 9.1   Liver Function Tests:  Recent Labs  12/03/13 0548  AST 18  ALT 19  ALKPHOS 166*  BILITOT 0.5  PROT 8.0  ALBUMIN 3.2*   No results for input(s): LIPASE, AMYLASE in the last 72 hours. CBC:  Recent Labs  12/03/13 0305 12/03/13 0548  WBC 14.9* 15.2*  HGB 8.7* 9.0*  HCT 26.3* 27.3*  MCV 85.7 85.6  PLT 338 348   Cardiac Enzymes: No results for input(s): CKTOTAL, CKMB, CKMBINDEX, TROPONINI in the last 72 hours. BNP: Invalid input(s): POCBNP D-Dimer:  Recent Labs  12/03/13 0548  DDIMER 1.90*   Hemoglobin A1C:  Recent Labs  12/03/13 0548  HGBA1C 9.8*   Fasting Lipid Panel: No results for input(s): CHOL, HDL, LDLCALC, TRIG, CHOLHDL, LDLDIRECT in the last 72 hours. Thyroid Function Tests:  Recent Labs  12/03/13 0555  TSH 3.480   Anemia Panel: No results for input(s): VITAMINB12, FOLATE, FERRITIN, TIBC, IRON, RETICCTPCT in the last 72 hours.  RADIOLOGY: Dg Chest 2 View  12/03/2013   CLINICAL DATA:  Shortness  of breath.  EXAM: CHEST  2 VIEW  COMPARISON:  Single view of the chest 03/24/2013, 04/01/2013 and 03/24/2013.  FINDINGS: Bilateral airspace disease has an appearance most consistent with pulmonary edema. Heart size is upper normal. No pleural effusion or pneumothorax.  IMPRESSION: Bilateral airspace disease has an appearance most consistent with pulmonary edema.   Electronically Signed   By: Drusilla Kannerhomas  Dalessio M.D.   On: 12/03/2013 03:56    PHYSICAL EXAM General: NAD Neck: JVP 12 cm, no thyromegaly or thyroid nodule.  Lungs: Crackles bilateral bases. CV: Nondisplaced PMI.  Heart regular S1/S2, no S3/S4, 2/6 early SEM RUSB.  Trace ankle edema.   Abdomen: Soft, nontender, no hepatosplenomegaly, no distention.  Neurologic: Alert and oriented x 3.  Psych: Normal affect. Extremities: No clubbing or  cyanosis.   TELEMETRY: Reviewed telemetry pt in NSR  ASSESSMENT AND PLAN: 43 yo with history of HTN, CKD stage IV, type II diabetes, and diastolic CHF presented with gradual weight gain and gradual onset of severe exertional dyspnea => dyspnea at rest.  She was noted to be volume overloaded and started on IV Lasix.  Pulmonary edema on CXR.  1. Acute on chronic diastolic CHF: Last echo in 5/15 with EF 55-60% and moderate LVH.  Suspect she has had a gradual fluid accumulation over time based on symptoms and weight rise.  She is volume overloaded today with dyspnea and marginal oxygen saturation.  She diuresed yesterday on high dose Lasix but BUN and creatinine up.  I am concerned that our ability to diurese her is going to be limited by cardiorenal syndrome.  - Would continue the current dose of Lasix today as she remains volume overloaded . 2. AKI on CKD: BUN/creatinine up with diuresis.  As she is short of breath and uncomfortable, I think that we will need to continue IV Lasix.  However, renal function may be limiting here.  If she worsens, may need to involve renal service.  She sees Dr Arrie Aranoladonato as an outpatient.  3. HTN: BP mildly elevated.  Increase Coreg to 12.5 mg bid.  4. Nonobstructive CAD: Troponin not elevated, continue statin.   Marca AnconaDalton Ulyana Pitones 12/04/2013 8:26 AM

## 2013-12-04 NOTE — Progress Notes (Signed)
Inpatient Diabetes Program Recommendations  AACE/ADA: New Consensus Statement on Inpatient Glycemic Control (2013)  Target Ranges:  Prepandial:   less than 140 mg/dL      Peak postprandial:   less than 180 mg/dL (1-2 hours)      Critically ill patients:  140 - 180 mg/dL    Reason for assessment: recent admission/ hx. diabetes  Diabetes history: Type 2 Outpatient Diabetes medications: Lantus 60 units qhs, Humalog 15 units tid with meals Current orders for Inpatient glycemic control: Lantus 60 units qhs  May want to consider adding Novolog 5-10 units tid with meals (takes 15 units at home with each meal) and Novolog moderate correction at meals and hs.   Jasmine RacerJulie Ed Mandich, RN, BA, MHA, CDE Diabetes Coordinator Inpatient Diabetes Program  [phone] (Team Pager) [phone] Jasmine [name]([place] Office) 12/04/2013 10:16 AM

## 2013-12-05 LAB — GLUCOSE, CAPILLARY
Glucose-Capillary: 125 mg/dL — ABNORMAL HIGH (ref 70–99)
Glucose-Capillary: 133 mg/dL — ABNORMAL HIGH (ref 70–99)
Glucose-Capillary: 143 mg/dL — ABNORMAL HIGH (ref 70–99)
Glucose-Capillary: 117 mg/dL — ABNORMAL HIGH (ref 70–99)

## 2013-12-05 LAB — BASIC METABOLIC PANEL
Anion gap: 23 — ABNORMAL HIGH (ref 5–15)
BUN: 106 mg/dL — AB (ref 6–23)
CALCIUM: 9.4 mg/dL (ref 8.4–10.5)
CO2: 18 mEq/L — ABNORMAL LOW (ref 19–32)
Chloride: 95 mEq/L — ABNORMAL LOW (ref 96–112)
Creatinine, Ser: 3.55 mg/dL — ABNORMAL HIGH (ref 0.50–1.10)
GFR calc Af Amer: 17 mL/min — ABNORMAL LOW (ref >90)
GFR, EST NON AFRICAN AMERICAN: 15 mL/min — AB (ref >90)
Glucose, Bld: 124 mg/dL — ABNORMAL HIGH (ref 70–99)
Potassium: 3.6 mEq/L — ABNORMAL LOW (ref 3.7–5.3)
Sodium: 136 mEq/L — ABNORMAL LOW (ref 137–147)

## 2013-12-05 MED ORDER — HYDROCODONE-ACETAMINOPHEN 5-325 MG PO TABS
1.0000 | ORAL_TABLET | Freq: Four times a day (QID) | ORAL | Status: DC | PRN
  Administered 2013-12-05 – 2013-12-11 (×11): 1 via ORAL
  Filled 2013-12-05 (×11): qty 1

## 2013-12-05 MED ORDER — DOCUSATE SODIUM 100 MG PO CAPS
100.0000 mg | ORAL_CAPSULE | Freq: Two times a day (BID) | ORAL | Status: DC
  Administered 2013-12-05 – 2013-12-12 (×15): 100 mg via ORAL
  Filled 2013-12-05 (×16): qty 1

## 2013-12-05 MED ORDER — INSULIN ASPART 100 UNIT/ML ~~LOC~~ SOLN
10.0000 [IU] | Freq: Three times a day (TID) | SUBCUTANEOUS | Status: DC
  Administered 2013-12-05 – 2013-12-10 (×7): 10 [IU] via SUBCUTANEOUS

## 2013-12-05 MED ORDER — POLYETHYLENE GLYCOL 3350 17 G PO PACK
17.0000 g | PACK | Freq: Every day | ORAL | Status: DC | PRN
  Administered 2013-12-05 – 2013-12-07 (×3): 17 g via ORAL
  Filled 2013-12-05 (×5): qty 1

## 2013-12-05 NOTE — Evaluation (Signed)
Physical Therapy Evaluation Patient Details Name: Jasmine [name] MRN: [id number] DOB: 07-26-1970 Today's Date: 12/05/2013   History of Present Illness  43 yo with history of HTN, CKD stage IV, type II diabetes, and diastolic CHF presented with gradual weight gain and gradual onset of severe exertional dyspnea => dyspnea at rest.  She was noted to be volume overloaded and started on IV Lasix.  Pulmonary edema on CXR.   Clinical Impression  Patient demonstrates deficits in functional mobility as indicated below. Will need continued skilled PT to address deficits and maximize function. Will see as indicated and progress as tolerated.  Anticipate patient will progress well with mobility as activity tolerance improves. OF NOTE: Patient saturations with activity 88% on 2 liters, improved to 93% with rest and Pursed lip breathing. Uppers 70s upon entering the room on room air prior to activity.    Follow Up Recommendations Supervision for mobility/OOB    Equipment Recommendations  None recommended by PT    Recommendations for Other Services       Precautions / Restrictions Precautions Precautions: Fall Precaution Comments: decreased O2 saturations Restrictions Weight Bearing Restrictions: No      Mobility  Bed Mobility               General bed mobility comments: received in chair  Transfers Overall transfer level: Needs assistance Equipment used: None Transfers: Sit to/from Stand Sit to Stand: Min guard         General transfer comment: performed x3  Ambulation/Gait Ambulation/Gait assistance: Min assist Ambulation Distance (Feet): 40 Feet Assistive device: 1 person hand held assist Gait Pattern/deviations: Step-through pattern;Decreased stride length;Trunk flexed;Wide base of support;Drifts right/left Gait velocity: decreased Gait velocity interpretation: Below normal speed for age/gender General Gait Details: unsteady with ambulation, poor coordination with  increased edema, may benefit from use of AD.  Stairs            Wheelchair Mobility    Modified Rankin (Stroke Patients Only)       Balance                                             Pertinent Vitals/Pain Pain Assessment: No/denies pain    Home Living Family/patient expects to be discharged to:: Private residence Living Arrangements: Spouse/significant other Available Help at Discharge: Family;Available PRN/intermittently Type of Home: Other(Comment) (second story condo) Home Access: Stairs to enter Entrance Stairs-Rails: Left Entrance Stairs-Number of Steps: flight to get in from outside and another flight once get inside with rails too wide to reach both Home Layout: One level Home Equipment: Cane - single point Additional Comments: cpap    Prior Function Level of Independence: Independent         Comments: 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     Hand Dominance   Dominant Hand: Right    Extremity/Trunk Assessment   Upper Extremity Assessment: Generalized weakness           Lower Extremity Assessment: Generalized weakness (decreased ROM, increased edema L > R)      Cervical / Trunk Assessment:  (increased body habitus)  Communication   Communication: No difficulties  Cognition Arousal/Alertness: Awake/alert Behavior During Therapy: WFL for tasks assessed/performed Overall Cognitive Status: Within Functional Limits for tasks assessed  General Comments General comments (skin integrity, edema, etc.): educated regarding O2 saturations 86% on 2 liters with ambulation, educated re: pursed lip breathing and nasal inhalation. Spoke with patient regarding self awareness for decreased saturations    Exercises        Assessment/Plan    PT Assessment Patient needs continued PT services  PT Diagnosis Difficulty walking;Abnormality of gait;Generalized weakness   PT Problem List  Decreased strength;Decreased range of motion;Decreased activity tolerance;Decreased balance;Decreased coordination;Decreased mobility;Decreased safety awareness;Cardiopulmonary status limiting activity  PT Treatment Interventions DME instruction;Gait training;Stair training;Functional mobility training;Therapeutic activities;Therapeutic exercise;Balance training;Patient/family education   PT Goals (Current goals can be found in the Care Plan section) Acute Rehab PT Goals Patient Stated Goal: to go home PT Goal Formulation: With patient Time For Goal Achievement: 12/19/13 Potential to Achieve Goals: Good    Frequency Min 3X/week   Barriers to discharge Inaccessible home environment two flights of stairs total to enter condo    Co-evaluation               End of Session Equipment Utilized During Treatment: Gait belt Activity Tolerance: Patient limited by fatigue Patient left: in chair;with call bell/phone within reach Nurse Communication: Mobility status         Time: [id number] PT Time Calculation (min) (ACUTE ONLY): 18 min   Charges:   PT Evaluation $Initial PT Evaluation Tier I: 1 Procedure PT Treatments $Therapeutic Activity: 8-22 mins   PT G CodesFabio Asa:          Tysin Salada J 12/05/2013, 3:56 PM Charlotte Crumbevon Oaklee Esther, PT DPT  [phone]

## 2013-12-05 NOTE — Progress Notes (Addendum)
Patient ID: Jasmine Jasmine [name] Jasmine [name], female   DOB: [birth year], 43 y.o.   MRN: [id number]   SUBJECTIVE: I/Os not well recorded but weight is down today.  BUN/creatinine up again today. Main complaint is LUQ pain and back pain.  No BM for several days.    Scheduled Meds: . amLODipine  10 mg Oral Daily  . antiseptic oral rinse  7 mL Mouth Rinse BID  . aspirin  81 mg Oral Daily  . atorvastatin  80 mg Oral q1800  . carvedilol  12.5 mg Oral BID WC  . Chlorhexidine Gluconate Cloth  6 each Topical Q0600  . clopidogrel  75 mg Oral Q breakfast  . DULoxetine  60 mg Oral Daily  . enoxaparin (LOVENOX) injection  40 mg Subcutaneous Q24H  . furosemide  160 mg Intravenous BID  . insulin aspart  0-20 Units Subcutaneous TID WC  . insulin aspart  0-5 Units Subcutaneous QHS  . insulin glargine  60 Units Subcutaneous QHS  . l-methylfolate-B6-B12  1 tablet Oral BID  . metoCLOPramide  10 mg Oral TID AC  . mupirocin ointment  1 application Nasal BID  . potassium chloride  40 mEq Oral BID  . sodium chloride  3 mL Intravenous Q12H   Continuous Infusions: . sodium chloride Stopped (12/03/13 1935)  . dextrose 5 % and 0.45% NaCl 10 mL/hr at 12/03/13 1935   PRN Meds:.sodium chloride, acetaminophen, albuterol, nitroGLYCERIN, ondansetron (ZOFRAN) IV, sodium chloride, traMADol    Filed Vitals:   12/04/13 0944 12/04/13 1515 12/04/13 2117 12/05/13 0550  BP: 140/71 143/75 150/75 139/67  Pulse: 87 88 82 83  Temp: 98 F (36.7 C) 98.1 F (36.7 C) 98.2 F (36.8 C) 97.9 F (36.6 C)  TempSrc: Oral Oral Oral Oral  Resp: 22  20 20   Height:      Weight:    286 lb 13.1 oz (130.1 kg)  SpO2: 93% 85% 94% 95%    Intake/Output Summary (Last 24 hours) at 12/05/13 0843 Last data filed at 12/05/13 0408  Gross per 24 hour  Intake    485 ml  Output   1600 ml  Net  -1115 ml    LABS: Basic Metabolic Panel:  Recent Labs  40/98/1109/11/08 0408 12/05/13 0404  NA 137 136*  K 3.1* 3.6*  CL 96 95*  CO2 21 18*  GLUCOSE 134* 124*    BUN 99* 106*  CREATININE 3.15* 3.55*  CALCIUM 9.1 9.4   Liver Function Tests:  Recent Labs  12/03/13 0548  AST 18  ALT 19  ALKPHOS 166*  BILITOT 0.5  PROT 8.0  ALBUMIN 3.2*   No results for input(s): LIPASE, AMYLASE in the last 72 hours. CBC:  Recent Labs  12/03/13 0305 12/03/13 0548  WBC 14.9* 15.2*  HGB 8.7* 9.0*  HCT 26.3* 27.3*  MCV 85.7 85.6  PLT 338 348   Cardiac Enzymes: No results for input(s): CKTOTAL, CKMB, CKMBINDEX, TROPONINI in the last 72 hours. BNP: Invalid input(s): POCBNP D-Dimer:  Recent Labs  12/03/13 0548  DDIMER 1.90*   Hemoglobin A1C:  Recent Labs  12/03/13 0548  HGBA1C 9.8*   Fasting Lipid Panel: No results for input(s): CHOL, HDL, LDLCALC, TRIG, CHOLHDL, LDLDIRECT in the last 72 hours. Thyroid Function Tests:  Recent Labs  12/03/13 0555  TSH 3.480   Anemia Panel: No results for input(s): VITAMINB12, FOLATE, FERRITIN, TIBC, IRON, RETICCTPCT in the last 72 hours.  RADIOLOGY: Dg Chest 2 View  12/03/2013   CLINICAL DATA:  Shortness of breath.  EXAM: CHEST  2 VIEW  COMPARISON:  Single view of the chest 03/24/2013, 04/01/2013 and 03/24/2013.  FINDINGS: Bilateral airspace disease has an appearance most consistent with pulmonary edema. Heart size is upper normal. No pleural effusion or pneumothorax.  IMPRESSION: Bilateral airspace disease has an appearance most consistent with pulmonary edema.   Electronically Signed   By: Jasmine Jasmine [name] Kannerhomas  Jasmine [name] M.D.   On: 12/03/2013 03:56    PHYSICAL EXAM General: NAD Neck: Thick, JVP 12 cm, no thyromegaly or thyroid nodule.  Lungs: Crackles bilateral bases. CV: Nondisplaced PMI.  Heart regular S1/S2, no S3/S4, 2/6 early SEM RUSB.  No edema.   Abdomen: Soft, nontender, no hepatosplenomegaly, no distention.  Neurologic: Alert and oriented x 3.  Psych: Normal affect. Extremities: No clubbing or cyanosis.   TELEMETRY: Reviewed telemetry pt in NSR  ASSESSMENT AND PLAN: 43 yo with history of HTN,  CKD stage IV, type II diabetes, and diastolic CHF presented with gradual weight gain and gradual onset of severe exertional dyspnea => dyspnea at rest.  She was noted to be volume overloaded and started on IV Lasix.  Pulmonary edema on CXR.  1. Acute on chronic diastolic CHF: Last echo in 5/15 with EF 55-60% and moderate LVH.  Suspect she has had a gradual fluid accumulation over time based on symptoms and weight rise.  I am concerned that our ability to diurese her is going to be limited by cardiorenal syndrome. She diuresed some yesterday, weight is down.  I think she still has some volume overload but BUN/creatinine up again today. - Hold Lasix today, follow BUN/creatinine.  - Will try to titrate off oxygen today and will have her ambulate.  2. AKI on CKD: BUN/creatinine up again with diuresis.  If she worsens, may need to involve renal service.  She sees Jasmine Jasmine [name] Jasmine [name] as an outpatient.  As above, holding Lasix today to see if she equilibrates.  3. HTN: Stable.  4. Nonobstructive CAD: Troponin not elevated, continue statin.  5. OSA: Will make sure she gets her CPAP at night.  6. Constipation: Miralax, Colace. 7. Diabetes: Add standing Novolog as recommended by diabetes nurse.  8. Ambulate/out of bed.   Jasmine Jasmine [name] Jasmine [name] 12/05/2013 8:43 AM

## 2013-12-05 NOTE — Progress Notes (Signed)
Inpatient Diabetes Program Recommendations  AACE/ADA: New Consensus Statement on Inpatient Glycemic Control (2013)  Target Ranges:  Prepandial:   less than 140 mg/dL      Peak postprandial:   less than 180 mg/dL (1-2 hours)      Critically ill patients:  140 - 180 mg/dL   Reason for assessment: elevated CBG  Diabetes history: Type 2 Outpatient Diabetes medications: Lantus 60 units qhs, Humalog 15 units tid with meals Current orders for Inpatient glycemic control: Lantus 60 units qhs, Novolog correction 0-20 units tid, Novolog correction 0-5units qhs.   May want to consider adding Novolog 5-10 units tid with meals (takes 15 units at home with each meal)  Jasmine RacerJulie San Lohmeyer, RN, BA, MHA, CDE Diabetes Coordinator Inpatient Diabetes Program  [phone] (Team Pager) [phone] Jasmine [name]( Office) 12/05/2013 8:13 AM

## 2013-12-06 LAB — GLUCOSE, CAPILLARY
GLUCOSE-CAPILLARY: 99 mg/dL (ref 70–99)
GLUCOSE-CAPILLARY: 76 mg/dL (ref 70–99)
GLUCOSE-CAPILLARY: 73 mg/dL (ref 70–99)
Glucose-Capillary: 153 mg/dL — ABNORMAL HIGH (ref 70–99)

## 2013-12-06 LAB — BASIC METABOLIC PANEL
Anion gap: 20 — ABNORMAL HIGH (ref 5–15)
BUN: 114 mg/dL — AB (ref 6–23)
CHLORIDE: 95 meq/L — AB (ref 96–112)
CO2: 20 mEq/L (ref 19–32)
Calcium: 9.3 mg/dL (ref 8.4–10.5)
Creatinine, Ser: 4.26 mg/dL — ABNORMAL HIGH (ref 0.50–1.10)
GFR calc Af Amer: 14 mL/min — ABNORMAL LOW (ref >90)
GFR calc non Af Amer: 12 mL/min — ABNORMAL LOW (ref >90)
GLUCOSE: 121 mg/dL — AB (ref 70–99)
Potassium: 4 mEq/L (ref 3.7–5.3)
SODIUM: 135 meq/L — AB (ref 137–147)

## 2013-12-06 MED ORDER — FUROSEMIDE 10 MG/ML IJ SOLN
160.0000 mg | Freq: Three times a day (TID) | INTRAVENOUS | Status: DC
  Administered 2013-12-06 – 2013-12-10 (×14): 160 mg via INTRAVENOUS
  Filled 2013-12-06 (×17): qty 16

## 2013-12-06 NOTE — Care Management Note (Addendum)
  Page 1 of 1   12/06/2013     3:59:11 PM CARE MANAGEMENT NOTE 12/06/2013  Patient:  Jasmine [name],Jasmine [name]   Account Number:  [id number]  Date Initiated:  12/06/2013  Documentation initiated by:  Donato SchultzHUTCHINSON,Graziella Connery  Subjective/Objective Assessment:   SOB     Action/Plan:   CM to follow for disposition needs   Anticipated DC Date:  12/07/2013   Anticipated DC Plan:  HOME/SELF CARE         Choice offered to / List presented to:             Status of service:  Completed, signed off Medicare Important Message given?  NO (If response is "NO", the following Medicare IM given date fields will be blank) Date Medicare IM given:   Medicare IM given by:   Date Additional Medicare IM given:   Additional Medicare IM given by:    Discharge Disposition:  HOME/SELF CARE  Per UR Regulation:  Reviewed for med. necessity/level of care/duration of stay  If discussed at Long Length of Stay Meetings, dates discussed:    Comments:  Mariea Mcmartin RN, BSN, MSHL, CCM  Nurse - Case Manager,  ([id number])  [phone]  12/06/2013 Social:  From home with husband PT RECS:  Supervision for mobility/OOB O2 at 3 [name] - attempting to wean Lasix Hold d/t increased Creat Dispo plan:  Home / Self care.

## 2013-12-06 NOTE — Progress Notes (Signed)
Pt. States that she will place herself on CPAP before going to bed & doesn't need assistance. Pt. Is aware to call if she needs help with CPAP.

## 2013-12-06 NOTE — Plan of Care (Signed)
Problem: Phase I Progression Outcomes Goal: Dyspnea controlled at rest (HF) Outcome: Completed/Met Date Met:  12/06/13 Goal: Pain controlled with appropriate interventions Outcome: Completed/Met Date Met:  12/06/13 Goal: Up in chair, BRP Outcome: Completed/Met Date Met:  12/06/13 Goal: Initial discharge plan identified Outcome: Completed/Met Date Met:  12/06/13 Goal: Voiding-avoid urinary catheter unless indicated Outcome: Completed/Met Date Met:  12/06/13 Goal: Hemodynamically stable Outcome: Completed/Met Date Met:  12/06/13 Goal: Other Phase I Outcomes/Goals Outcome: Not Applicable Date Met:  56/31/49

## 2013-12-06 NOTE — Consult Note (Signed)
Reason for Consult: AKI Referring Physician: Dr. Shirlee Latch  HPI: Jasmine [name] is an 43 y.o. female with PMH CKD4, heart failure with preserved EF (55-60%), anemia of chronic disease, diabetes mellitus on insulin and hypertension. Admitted on 12/03/2013 for heart failure exacerbation. Patient states she now takes torsemide 80mg  BID and metolazone 2.5mg  for weight gain >3lbs above dry weight. Patient states she has not been adherent with Aranesp treatment because she missed one treatment and never caught up. She is worried about the possibility of dialysis. Patient initially diuresed with lasix 80mg  BID, increased to 160mg  BID that was discontinued with rising creatinine. Patient's breathing improved at rest, but still with dyspnea on exertion.   Last office notes in patient's shadow chart.   Trend in Creatinine: CREATININE, SER  Date/Time Value Ref Range Status  12/06/2013 04:03 AM 4.26* 0.50 - 1.10 mg/dL Final  16/10/9602 54:09 AM 3.55* 0.50 - 1.10 mg/dL Final  81/19/1478 29:56 AM 3.15* 0.50 - 1.10 mg/dL Final  21/30/8657 84:69 AM 2.55* 0.50 - 1.10 mg/dL Final  62/95/2841 32:44 AM 2.52* 0.50 - 1.10 mg/dL Final  01/27/7251 66:44 AM 2.75* 0.57 - 1.00 mg/dL Final  03/47/4259 56:38 PM 1.98* 0.50 - 1.10 mg/dL Final  75/64/3329 51:88 PM 1.80* 0.50 - 1.10 mg/dL Final  41/66/0630 16:01 PM 1.87* 0.50 - 1.10 mg/dL Final  09/32/3557 32:20 AM 2.07* 0.50 - 1.10 mg/dL Final  25/42/7062 37:62 AM 2.18* 0.50 - 1.10 mg/dL Final  83/15/1761 60:73 AM 2.23* 0.50 - 1.10 mg/dL Final  71/06/2692 85:46 AM 2.10* 0.50 - 1.10 mg/dL Final  27/03/5007 38:18 AM 2.14* 0.50 - 1.10 mg/dL Final  29/93/7169 67:89 PM 2.11* 0.50 - 1.10 mg/dL Final  38/10/1749 02:58 PM 2.09* 0.50 - 1.10 mg/dL Final  52/77/8242 35:36 AM 2.02* 0.50 - 1.10 mg/dL Final  14/43/1540 08:67 AM 2.12* 0.50 - 1.10 mg/dL Final  61/95/0932 67:12 PM 2.15* 0.50 - 1.10 mg/dL Final  45/80/9983 38:25 PM 2.28* 0.50 - 1.10 mg/dL Final  05/39/7673 41:93 AM 2.09*  0.50 - 1.10 mg/dL Final  79/02/4095 35:32 AM 2.02* 0.50 - 1.10 mg/dL Final  99/24/2683 41:96 AM 3.23* 0.50 - 1.10 mg/dL Final  22/29/7989 21:19 AM 3.36* 0.50 - 1.10 mg/dL Final  41/74/0814 48:18 AM 4.00* 0.50 - 1.10 mg/dL Final  56/31/4970 26:37 AM 4.35* 0.50 - 1.10 mg/dL Final  85/88/5027 74:12 AM 4.84* 0.50 - 1.10 mg/dL Final  87/86/7672 09:47 AM 5.82* 0.50 - 1.10 mg/dL Final  09/62/8366 29:47 PM 6.08* 0.50 - 1.10 mg/dL Final  65/46/5035 46:56 AM 4.49* 0.50 - 1.10 mg/dL Final  81/27/5170 01:74 AM 4.13* 0.50 - 1.10 mg/dL Final  94/49/6759 16:38 PM 4.93* 0.50 - 1.10 mg/dL Final  46/65/9935 70:17 AM 4.72* 0.50 - 1.10 mg/dL Final  79/39/0300 92:33 AM 3.41* 0.50 - 1.10 mg/dL Final  00/76/2263 33:54 AM 3.30* 0.50 - 1.10 mg/dL Final  56/25/6389 37:34 PM 2.35* 0.50 - 1.10 mg/dL Final  28/76/8115 72:62 AM 2.01* 0.50 - 1.10 mg/dL Final  03/55/9741 63:84 PM 2.06* 0.50 - 1.10 mg/dL Final  53/64/6803 21:22 AM 2.05* 0.50 - 1.10 mg/dL Final  48/25/0037 04:88 PM 2.10* 0.50 - 1.10 mg/dL Final  89/16/9450 38:88 AM 2.50* 0.50 - 1.10 mg/dL Final    Comment:    DELTA CHECK NOTED  03/27/2013 04:00 PM 3.79* 0.50 - 1.10 mg/dL Final  28/00/3491 79:15 AM 4.07* 0.50 - 1.10 mg/dL Final  05/69/7948 01:65 AM 3.65* 0.50 - 1.10 mg/dL Final  53/74/8270 78:67 AM 3.42* 0.50 - 1.10 mg/dL Final  03/26/2013 08:08 PM 3.43* 0.50 - 1.10 mg/dL Final  47/82/956203/02/2013 13:0805:41 PM 3.42* 0.50 - 1.10 mg/dL Final  65/78/469603/02/2013 29:5203:12 PM 3.36* 0.50 - 1.10 mg/dL Final  84/13/244003/02/2013 10:2706:35 AM 3.10* 0.50 - 1.10 mg/dL Final  25/36/644003/02/2013 34:7401:22 AM 3.02* 0.50 - 1.10 mg/dL Final  25/95/638703/01/2013 56:4307:50 PM 2.90* 0.50 - 1.10 mg/dL Final  32/95/188403/01/2013 16:6002:42 PM 2.80* 0.50 - 1.10 mg/dL Final    PMH:   Past Medical History  Diagnosis Date  . Diastolic heart failure     a. EF 55-60%, grade II DD (05/2013)  . Hypertensive heart disease   . Chronic kidney disease (CKD) stage G4/A1, severely decreased glomerular filtration rate (GFR) between 15-29 mL/min/1.73  square meter and albuminuria creatinine ratio less than 30 mg/g     stage II-III  . Hyperlipidemia   . 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 Anemia   . Diabetic gastroparesis   . Morbid obesity   . Coronary artery disease      a. Cath 2010: non obstructive CAD (30% LAD proximal, mid and distal lesions, 60% diagonal, 30% Dominant mid circumflex) b. Lexiscan Myoview (05/2013) Fixed anteroapical scar of medium sixe, no reversible ischemia  . Pneumonia due to unspecified Streptococcus 10/16/2012  . Type II diabetes mellitus with ophthalmic manifestations   . Bipolar 1 disorder   . Diabetic nephropathy   . Subendocardial infarction   . Gastroparesis   . Depressive disorder   . Cough   . Chronic kidney disease (CKD), stage IV (severe)   . Carpal tunnel syndrome   . Pain in soft tissues of limb   . Allergic rhinitis   . Acute respiratory failure   . Abscess of anal and rectal regions     PSH:   Past Surgical History  Procedure Laterality Date  . Cholecystectomy    . Carpal tunnel release  2003    Allergies:  Allergies  Allergen Reactions  . Contrast Media [Iodinated Diagnostic Agents] Nausea And Vomiting  . Paroxetine Nausea And Vomiting  . Oxycodone Itching and Rash    Medications:   Prior to Admission medications   Medication Sig Start Date End Date Taking? Authorizing Provider  acetaminophen (TYLENOL) 500 MG tablet Take 500 mg by mouth every 6 (six) hours as needed for mild pain.   Yes Historical Provider, MD  amLODipine (NORVASC) 10 MG tablet Take 10 mg by mouth daily.   Yes Historical Provider, MD  aspirin 81 MG tablet Take 81 mg by mouth daily. For pain   Yes Historical Provider, MD  atorvastatin (LIPITOR) 80 MG tablet Take 1 tablet (80 mg total) by mouth daily at 6 PM. 05/30/13  Yes Estela Isaiah BlakesY Hernandez Acosta, MD  carvedilol (COREG) 6.25 MG tablet Take 1.5 tablets (9.375 mg total) by mouth 2 (two) times daily with a meal. 08/29/13  Yes Amy D  Clegg, NP  clopidogrel (PLAVIX) 75 MG tablet Take 1 tablet (75 mg total) by mouth daily with breakfast. 10/10/13  Yes Amy D Clegg, NP  DULoxetine (CYMBALTA) 60 MG capsule Take 1 capsule (60 mg total) by mouth daily. 10/03/13  Yes Anson FretAntonia B Ahern, MD  ferrous sulfate 325 (65 FE) MG tablet Take 325 mg by mouth 2 (two) times daily.     Yes Historical Provider, MD  insulin glargine (LANTUS) 100 UNIT/ML injection Inject 60 Units into the skin at bedtime.   Yes Historical Provider, MD  insulin lispro (HUMALOG) 100 UNIT/ML injection Inject 15-30 Units into the  skin 3 (three) times daily before meals. Sliding scale   Yes Historical Provider, MD  l-methylfolate-B6-B12 (METANX) 3-35-2 MG TABS Take 1 tablet by mouth 2 (two) times daily. 10/03/13  Yes Anson Fret, MD  metoCLOPramide (REGLAN) 10 MG tablet Take 10 mg by mouth 3 (three) times daily before meals. One po qac 09/06/13  Yes Historical Provider, MD  metolazone (ZAROXOLYN) 2.5 MG tablet TAKE 1 TABLET (2.5 MG TOTAL) BY MOUTH AS DIRECTED. 11/12/13  Yes Dolores Patty, MD  metoprolol tartrate (LOPRESSOR) 25 MG tablet Take 25 mg by mouth daily.  05/30/13  Yes Historical Provider, MD  torsemide (DEMADEX) 20 MG tablet Take 4 tablets (80 mg total) by mouth 2 (two) times daily. 09/25/13  Yes Dolores Patty, MD  albuterol (PROVENTIL HFA;VENTOLIN HFA) 108 (90 BASE) MCG/ACT inhaler Inhale 2 puffs into the lungs every 6 (six) hours as needed for wheezing. 10/25/12   Amy D Clegg, NP  capsicum (ZOSTRIX) 0.075 % topical cream Apply 1 application topically 3 (three) times daily. Apply to feet up to 3x daily 10/03/13   Anson Fret, MD  HYDROcodone-acetaminophen (NORCO) 5-325 MG per tablet Take 1 tablet by mouth every 6 (six) hours as needed for moderate pain. 04/13/13   Penny Pia, MD  insulin glargine (LANTUS) 100 UNIT/ML injection Inject 50 Units into the skin at bedtime.    Historical Provider, MD  insulin lispro (HUMALOG) 100 UNIT/ML injection Inject 12 Units into  the skin 3 (three) times daily before meals.    Historical Provider, MD  metolazone (ZAROXOLYN) 2.5 MG tablet TAKE 1 TABLET (2.5 MG TOTAL) BY MOUTH AS DIRECTED. 11/10/13   Brittainy Sherlynn Carbon, PA-C  Prenatal Vit-Fe Fumarate-FA (PRENATAL MULTIVITAMIN) TABS tablet Take 1 tablet by mouth daily at 12 noon.    Historical Provider, MD    Inpatient medications: . amLODipine  10 mg Oral Daily  . antiseptic oral rinse  7 mL Mouth Rinse BID  . aspirin  81 mg Oral Daily  . atorvastatin  80 mg Oral q1800  . carvedilol  12.5 mg Oral BID WC  . Chlorhexidine Gluconate Cloth  6 each Topical Q0600  . clopidogrel  75 mg Oral Q breakfast  . docusate sodium  100 mg Oral BID  . DULoxetine  60 mg Oral Daily  . enoxaparin (LOVENOX) injection  40 mg Subcutaneous Q24H  . insulin aspart  0-20 Units Subcutaneous TID WC  . insulin aspart  0-5 Units Subcutaneous QHS  . insulin aspart  10 Units Subcutaneous TID WC  . insulin glargine  60 Units Subcutaneous QHS  . l-methylfolate-B6-B12  1 tablet Oral BID  . metoCLOPramide  10 mg Oral TID AC  . mupirocin ointment  1 application Nasal BID  . potassium chloride  40 mEq Oral BID  . sodium chloride  3 mL Intravenous Q12H    Discontinued Meds:   Medications Discontinued During This Encounter  Medication Reason  . aspirin tablet 81 mg Formulary change  . insulin glargine (LANTUS) injection 60 Units   . insulin aspart (novoLOG) injection 15 Units   . insulin aspart (novoLOG) injection 0-20 Units   . acetaminophen (TYLENOL) tablet 500 mg Duplicate  . albuterol (PROVENTIL HFA;VENTOLIN HFA) 108 (90 BASE) MCG/ACT inhaler 2 puff Formulary change  . carvedilol (COREG) tablet 9.375 mg   . insulin regular (NOVOLIN R,HUMULIN R) 250 Units in sodium chloride 0.9 % 250 mL (1 Units/mL) infusion   . enoxaparin (LOVENOX) injection 30 mg   . furosemide (LASIX)  160 mg in dextrose 5 % 50 mL IVPB     Social History:  reports that she has never smoked. She has never used smokeless  tobacco. She reports that she drinks alcohol. She reports that she does not use illicit drugs.  Family History:   Family History  Problem Relation Age of Onset  . Heart attack Mother   . Dementia Father     Pertinent items are noted in HPI. Weight change: 1 lb 15.7 oz (0.9 kg)  Intake/Output Summary (Last 24 hours) at 12/06/13 1109 Last data filed at 12/06/13 1029  Gross per 24 hour  Intake    843 ml  Output   1150 ml  Net   -307 ml   BP 144/70 mmHg  Pulse 76  Temp(Src) 98 F (36.7 C) (Oral)  Resp 20  Ht 5\' 7"  (1.702 m)  Wt 288 lb 12.8 oz (131 kg)  BMI 45.22 kg/m2  SpO2 93% Filed Vitals:   12/06/13 0017 12/06/13 0557 12/06/13 0730 12/06/13 0933  BP:  142/68 127/63 144/70  Pulse: 76 78 80 76  Temp:  97.6 F (36.4 C) 97.8 F (36.6 C) 98 F (36.7 C)  TempSrc:  Oral Oral Oral  Resp: 16 17 18 20   Height:      Weight:  288 lb 12.8 oz (131 kg)    SpO2: 96% 94% 95% 93%     Gen:  Neck: Could not see JVD, however, neck is thick Respiratory: rales up to mid lung bilaterally, no wheezing Heart: RRR, no murmur Abdomen: Soft, non-tender, obese Extremities: 1-2+ pitting edema up to shins bilaterally  Labs: Basic Metabolic Panel:  Recent Labs Lab 12/03/13 0305 12/03/13 0548 12/04/13 0408 12/05/13 0404 12/06/13 0403  NA 133* 133* 137 136* 135*  K 3.6* 3.6* 3.1* 3.6* 4.0  CL 94* 94* 96 95* 95*  CO2 20 18* 21 18* 20  GLUCOSE 415* 460* 134* 124* 121*  BUN 88* 91* 99* 106* 114*  CREATININE 2.52* 2.55* 3.15* 3.55* 4.26*  ALBUMIN  --  3.2*  --   --   --   CALCIUM 8.7 9.0 9.1 9.4 9.3   Liver Function Tests:  Recent Labs Lab 12/03/13 0548  AST 18  ALT 19  ALKPHOS 166*  BILITOT 0.5  PROT 8.0  ALBUMIN 3.2*   No results for input(s): LIPASE, AMYLASE in the last 168 hours. No results for input(s): AMMONIA in the last 168 hours. CBC:  Recent Labs Lab 12/03/13 0305 12/03/13 0548  WBC 14.9* 15.2*  HGB 8.7* 9.0*  HCT 26.3* 27.3*  MCV 85.7 85.6  PLT 338 348    No results for input(s): CKTOTAL, CKMB, CKMBINDEX, TROPONINI in the last 168 hours.  Urinalysis    Component Value Date/Time   COLORURINE YELLOW 12/03/2013 0409   APPEARANCEUR CLOUDY* 12/03/2013 0409   LABSPEC 1.010 12/03/2013 0409   PHURINE 5.5 12/03/2013 0409   GLUCOSEU 100* 12/03/2013 0409   HGBUR TRACE* 12/03/2013 0409   BILIRUBINUR NEGATIVE 12/03/2013 0409   KETONESUR NEGATIVE 12/03/2013 0409   PROTEINUR 100* 12/03/2013 0409   UROBILINOGEN 0.2 12/03/2013 0409   NITRITE NEGATIVE 12/03/2013 0409   LEUKOCYTESUR SMALL* 12/03/2013 0409   Urine Microscopy (11/9)   Ref Range 12/03/2013  04/06/2013  Squamous Epithelial / LPF RARE  MANY (A) MANY (A)  WBC, UA <3 WBC/hpf 21-50 7-10  RBC / HPF <3 RBC/hpf 3-6 7-10  Bacteria, UA RARE  MANY (A) FEW (A)  Casts NEGATIVE  HYALINE CASTS (A)  CBG:  Recent Labs Lab 12/05/13 0609 12/05/13 1134 12/05/13 1646 12/05/13 2143 12/06/13 0608  GLUCAP 125* 133* 143* 117* 99    Iron Studies: No results for input(s): IRON, TIBC, TRANSFERRIN, FERRITIN in the last 168 hours.  Xrays/Other Studies: No results found.   Assessment/Plan: 1. AKI on CKD4: Baseline creatinine of about 1.9 (labs done in 06/2013). Patient has been diuresed for fluid overload secondary to acute CHF exacerbation. Weight on admission of 131.5 kg (11/9) and down to 131 kg today. Initially diuresed with furosemide 80mg  IV BID and bumped up to 160mg  IV BID. Diureses stopped on 11/10 with a peak creatinine of 4.26 today. Blood pressure has been mostly elevated with a few dips to the 100-110s systolic range during admission. UOP has decreased substantially since stopping diuretics ( in last 24 hours, if accurate). She is on potassium BID. Patient still fluid up. 1. Restart diuresis with lasix 160mg  TID 2. Daily labs 2. Acute on chronic CHF exacerbation: management per primary 3. Anemia of chronic disease/CKD: Hemoglobin of 9.7 in 06/2013. Supposed to receive  Aranesp sq q2weeks, however, last treatment in September 2015. Hemoglobin stable currently but down slightly from baseline. 4. Diabetes mellitus: on lantus 60u qhs and novolog 10u TID AC with sliding scale qHS. 1. Management per primary 5. HTN: on amlodipine 10mg  and carvedilol 12.5mg . Will watch blood pressures closely for hypotension as that may worsen AKI     Jacquelin Hawking, MD PGY-2, [facility] 12/06/2013, 11:15 AM I have seen and examined this patient and agree with the plan of care   [facility] 12/06/2013, 3:56 PM

## 2013-12-06 NOTE — Progress Notes (Signed)
UR completed Krishan Mcbreen K. Lilyahna Sirmon, RN, BSN, MSHL, CCM  12/06/2013 3:54 PM

## 2013-12-06 NOTE — Progress Notes (Signed)
Patient ID: Jasmine [name], female   DOB: [birth year], 43 y.o.   MRN: [id number]   SUBJECTIVE: Back pain improved.  No diuretics yesterday but BUN/creatinine continues to rise.  Says she is breathing ok.  Unable to titrate off oxygen.  She does not wear oxygen at home.   Scheduled Meds: . amLODipine  10 mg Oral Daily  . antiseptic oral rinse  7 mL Mouth Rinse BID  . aspirin  81 mg Oral Daily  . atorvastatin  80 mg Oral q1800  . carvedilol  12.5 mg Oral BID WC  . Chlorhexidine Gluconate Cloth  6 each Topical Q0600  . clopidogrel  75 mg Oral Q breakfast  . docusate sodium  100 mg Oral BID  . DULoxetine  60 mg Oral Daily  . enoxaparin (LOVENOX) injection  40 mg Subcutaneous Q24H  . insulin aspart  0-20 Units Subcutaneous TID WC  . insulin aspart  0-5 Units Subcutaneous QHS  . insulin aspart  10 Units Subcutaneous TID WC  . insulin glargine  60 Units Subcutaneous QHS  . l-methylfolate-B6-B12  1 tablet Oral BID  . metoCLOPramide  10 mg Oral TID AC  . mupirocin ointment  1 application Nasal BID  . potassium chloride  40 mEq Oral BID  . sodium chloride  3 mL Intravenous Q12H   Continuous Infusions: . sodium chloride Stopped (12/03/13 1935)  . dextrose 5 % and 0.45% NaCl 10 mL/hr at 12/03/13 1935   PRN Meds:.sodium chloride, acetaminophen, albuterol, HYDROcodone-acetaminophen, nitroGLYCERIN, ondansetron (ZOFRAN) IV, polyethylene glycol, sodium chloride, traMADol    Filed Vitals:   12/05/13 2144 12/06/13 0017 12/06/13 0557 12/06/13 0730  BP: 117/55  142/68 127/63  Pulse: 74 76 78 80  Temp: 98.1 F (36.7 C)  97.6 F (36.4 C) 97.8 F (36.6 C)  TempSrc: Oral  Oral Oral  Resp: 18 16 17 18   Height:      Weight:   288 lb 12.8 oz (131 kg)   SpO2: 94% 96% 94% 95%    Intake/Output Summary (Last 24 hours) at 12/06/13 0906 Last data filed at 12/06/13 0745  Gross per 24 hour  Intake    720 ml  Output    450 ml  Net    270 ml    LABS: Basic Metabolic Panel:  Recent Labs   12/05/13 0404 12/06/13 0403  NA 136* 135*  K 3.6* 4.0  CL 95* 95*  CO2 18* 20  GLUCOSE 124* 121*  BUN 106* 114*  CREATININE 3.55* 4.26*  CALCIUM 9.4 9.3   Liver Function Tests: No results for input(s): AST, ALT, ALKPHOS, BILITOT, PROT, ALBUMIN in the last 72 hours. No results for input(s): LIPASE, AMYLASE in the last 72 hours. CBC: No results for input(s): WBC, NEUTROABS, HGB, HCT, MCV, PLT in the last 72 hours. Cardiac Enzymes: No results for input(s): CKTOTAL, CKMB, CKMBINDEX, TROPONINI in the last 72 hours. BNP: Invalid input(s): POCBNP D-Dimer: No results for input(s): DDIMER in the last 72 hours. Hemoglobin A1C: No results for input(s): HGBA1C in the last 72 hours. Fasting Lipid Panel: No results for input(s): CHOL, HDL, LDLCALC, TRIG, CHOLHDL, LDLDIRECT in the last 72 hours. Thyroid Function Tests: No results for input(s): TSH, T4TOTAL, T3FREE, THYROIDAB in the last 72 hours.  Invalid input(s): FREET3 Anemia Panel: No results for input(s): VITAMINB12, FOLATE, FERRITIN, TIBC, IRON, RETICCTPCT in the last 72 hours.  RADIOLOGY: Dg Chest 2 View  12/03/2013   CLINICAL DATA:  Shortness of breath.  EXAM: CHEST  2 VIEW  COMPARISON:  Single view of the chest 03/24/2013, 04/01/2013 and 03/24/2013.  FINDINGS: Bilateral airspace disease has an appearance most consistent with pulmonary edema. Heart size is upper normal. No pleural effusion or pneumothorax.  IMPRESSION: Bilateral airspace disease has an appearance most consistent with pulmonary edema.   Electronically Signed   By: Drusilla Kannerhomas  Dalessio M.D.   On: 12/03/2013 03:56    PHYSICAL EXAM General: NAD Neck: Thick, JVP 12 cm, no thyromegaly or thyroid nodule.  Lungs: Crackles bilateral bases. CV: Nondisplaced PMI.  Heart regular S1/S2, no S3/S4, 2/6 early SEM RUSB.  1+ edema 1/2 up lower legs bilaterally.   Abdomen: Soft, nontender, no hepatosplenomegaly, no distention.  Neurologic: Alert and oriented x 3.  Psych: Normal  affect. Extremities: No clubbing or cyanosis.   TELEMETRY: Reviewed telemetry pt in NSR  ASSESSMENT AND PLAN: 43 yo with history of HTN, CKD stage IV, type II diabetes, and diastolic CHF presented with gradual weight gain and gradual onset of severe exertional dyspnea => dyspnea at rest.  She was noted to be volume overloaded and started on IV Lasix.  Pulmonary edema on CXR.  1. Acute on chronic diastolic CHF: Last echo in 5/15 with EF 55-60% and moderate LVH.  Suspect she has had a gradual fluid accumulation over time based on symptoms and weight rise.  Our ability to diurese her is limited by cardiorenal syndrome. She did not get any diuretics yesterday but BUN/creatinine up again.  She still is volume overloaded on exam and requires oxygen.  There may be a component of obesity-hypoventilation syndrome, but she has not needed oxygen in the past.  - Continue to hold Lasix.   - Will ask for nephrology input.   2. AKI on CKD: BUN/creatinine up again despite holding diuresis.  Will ask renal to evaluate.  She is volume overloaded and hypoxic but having a difficult time managing with diuretics given renal dysfunction.  She sees Dr Arrie Aranoladonato as an outpatient.   3. HTN: Stable.  4. Nonobstructive CAD: Troponin not elevated, continue statin.  5. OSA: Using CPAP at night.  6. Ambulate/out of bed.   Marca AnconaDalton Maicee Ullman 12/06/2013 9:06 AM

## 2013-12-06 NOTE — Progress Notes (Signed)
Physical Therapy Treatment Patient Details Name: Jasmine [name] MRN: [id number] DOB: January 17, 1971 Today's Date: 12/06/2013    History of Present Illness 43 yo with history of HTN, CKD stage IV, type II diabetes, and diastolic CHF presented with gradual weight gain and gradual onset of severe exertional dyspnea => dyspnea at rest.  She was noted to be volume overloaded and started on IV Lasix.  Pulmonary edema on CXR.     PT Comments    Worked with patient for ambulation and energy conservation.  Patient ambulated in increased distance with several seated rest breaks on 3 liters.  SpO2 with use of RW remained >94% on 3 liters, without device desaturated to 88%.  Reeducated on nasal inspiration and pursed lip breathing during activity as well as limiting arm motion/swing to conserve energy, patient tolerated well with improved stability and improved O2 saturations. Spoke with patient regarding other energy conservation techniques and self awareness signs for management of activity.  Patient receptive and appreciative. Will continue to see and progress as tolerated.   Follow Up Recommendations  Supervision for mobility/OOB     Equipment Recommendations  None recommended by PT    Recommendations for Other Services       Precautions / Restrictions Precautions Precautions: Fall Precaution Comments: decreased O2 saturations Restrictions Weight Bearing Restrictions: No    Mobility  Bed Mobility               General bed mobility comments: received in chair  Transfers Overall transfer level: Needs assistance Equipment used: None Transfers: Sit to/from Stand Sit to Stand: Min guard         General transfer comment: performed x3  Ambulation/Gait Ambulation/Gait assistance: Supervision;Min guard Ambulation Distance (Feet): 150 Feet (30' x5 trials, 2 with use of RW, 3 without assistive device) Assistive device: Rolling walker (2 wheeled);None Gait Pattern/deviations:  Step-through pattern;Decreased stride length;Trunk flexed;Wide base of support;Drifts right/left Gait velocity: decreased Gait velocity interpretation: Below normal speed for age/gender General Gait Details: improved stability this session, extensive education regarding energy conservation during ambulation.   Stairs            Wheelchair Mobility    Modified Rankin (Stroke Patients Only)       Balance                                    Cognition Arousal/Alertness: Awake/alert Behavior During Therapy: WFL for tasks assessed/performed Overall Cognitive Status: Within Functional Limits for tasks assessed                      Exercises      General Comments        Pertinent Vitals/Pain      Home Living                      Prior Function            PT Goals (current goals can now be found in the care plan section) Acute Rehab PT Goals Patient Stated Goal: to go home PT Goal Formulation: With patient Time For Goal Achievement: 12/19/13 Potential to Achieve Goals: Good Progress towards PT goals: Progressing toward goals    Frequency  Min 3X/week    PT Plan Current plan remains appropriate    Co-evaluation             End of Session Equipment Utilized During  Treatment: Gait belt Activity Tolerance: Patient limited by fatigue Patient left: in chair;with call bell/phone within reach     Time: [id number] PT Time Calculation (min) (ACUTE ONLY): 24 min  Charges:  $Gait Training: 8-22 mins $Self Care/Home Management: 8-22                    G CodesFabio Asa:      Vernisha Bacote J 12/06/2013, 3:45 PM Charlotte Crumbevon Braysen Cloward, PT DPT  [phone]

## 2013-12-07 ENCOUNTER — Inpatient Hospital Stay (HOSPITAL_COMMUNITY): Payer: 59

## 2013-12-07 LAB — URINALYSIS, ROUTINE W REFLEX MICROSCOPIC
Bilirubin Urine: NEGATIVE
GLUCOSE, UA: NEGATIVE mg/dL
Hgb urine dipstick: NEGATIVE
Ketones, ur: NEGATIVE mg/dL
Nitrite: NEGATIVE
PH: 5 (ref 5.0–8.0)
PROTEIN: NEGATIVE mg/dL
Specific Gravity, Urine: 1.01 (ref 1.005–1.030)
Urobilinogen, UA: 0.2 mg/dL (ref 0.0–1.0)

## 2013-12-07 LAB — BASIC METABOLIC PANEL
Anion gap: 21 — ABNORMAL HIGH (ref 5–15)
BUN: 122 mg/dL — AB (ref 6–23)
CALCIUM: 8.8 mg/dL (ref 8.4–10.5)
CO2: 19 mEq/L (ref 19–32)
CREATININE: 4.53 mg/dL — AB (ref 0.50–1.10)
Chloride: 99 mEq/L (ref 96–112)
GFR calc Af Amer: 13 mL/min — ABNORMAL LOW (ref >90)
GFR, EST NON AFRICAN AMERICAN: 11 mL/min — AB (ref >90)
GLUCOSE: 119 mg/dL — AB (ref 70–99)
POTASSIUM: 4.1 meq/L (ref 3.7–5.3)
Sodium: 139 mEq/L (ref 137–147)

## 2013-12-07 LAB — GLUCOSE, CAPILLARY
GLUCOSE-CAPILLARY: 64 mg/dL — AB (ref 70–99)
GLUCOSE-CAPILLARY: 105 mg/dL — AB (ref 70–99)
Glucose-Capillary: 98 mg/dL (ref 70–99)
Glucose-Capillary: 96 mg/dL (ref 70–99)
Glucose-Capillary: 142 mg/dL — ABNORMAL HIGH (ref 70–99)

## 2013-12-07 LAB — URINE MICROSCOPIC-ADD ON

## 2013-12-07 MED ORDER — CIPROFLOXACIN HCL 500 MG PO TABS
500.0000 mg | ORAL_TABLET | Freq: Every day | ORAL | Status: AC
Start: 2013-12-07 — End: 2013-12-11
  Administered 2013-12-07 – 2013-12-11 (×5): 500 mg via ORAL
  Filled 2013-12-07 (×5): qty 1

## 2013-12-07 MED ORDER — DARBEPOETIN ALFA 60 MCG/0.3ML IJ SOSY
60.0000 ug | PREFILLED_SYRINGE | INTRAMUSCULAR | Status: DC
  Administered 2013-12-07: 60 ug via SUBCUTANEOUS
  Filled 2013-12-07: qty 0.3

## 2013-12-07 NOTE — Progress Notes (Signed)
Pt. States that she will place herself on CPAP before going to bed & doesn't need assistance. Pt. Is aware to call if she needs help with CPAP.  

## 2013-12-07 NOTE — Progress Notes (Signed)
Pt. A/Ox4 and is ambulatory without assistance. She had c/o back pain during the shift and prn pain medication was given. Pt.cbg was 64 and carbohydrate snack was given. CBG was rechecked and was 96. LBM was charted 11/7 patient is on miralax and colace scheduled po, still no bowel movement today. On call MD on paged and notified, awaiting new orders.

## 2013-12-07 NOTE — Progress Notes (Signed)
Patient ID: Jasmine [name], female    DOB: [birth year], 43 y.o.   MRN: [id number] Nephrology Progress Note  S: Patient states her breathing has slightly improved. She still has dyspnea on exertion but that has also improved. She has not noticed any change in her swelling and states she keeps her legs elevated when seated. Weight has really not changed but she says she feels much better breathing wise since admit  O:BP 142/75 mmHg  Pulse 79  Temp(Src) 98.1 F (36.7 C) (Oral)  Resp 17  Ht 5\' 7"  (1.702 m)  Wt 289 lb 8 oz (131.316 kg)  BMI 45.33 kg/m2  SpO2 97%  Intake/Output Summary (Last 24 hours) at 12/07/13 0733 Last data filed at 12/07/13 0631  Gross per 24 hour  Intake    583 ml  Output   1150 ml  Net   -567 ml   Intake/Output: I/O last 3 completed shifts: In: 823 [P.O.:820; I.V.:3] Out: 1600 [Urine:1600]  Intake/Output this shift:    Weight change: 11.2 oz (0.316 kg) QQV:ZDGLGen:Fair appearing female, obese, no distress OVF:IEPPIRJCVS:Regular rate and rhythm, no murmur Resp: bilateral rales, improved from yesterday JOA:CZYSAbd:Soft, non-tender, non-distended Ext:2+ edema bilaterally up to shins   Recent Labs Lab 12/03/13 0305 12/03/13 0548 12/04/13 0408 12/05/13 0404 12/06/13 0403 12/07/13 0355  NA 133* 133* 137 136* 135* 139  K 3.6* 3.6* 3.1* 3.6* 4.0 4.1  CL 94* 94* 96 95* 95* 99  CO2 20 18* 21 18* 20 19  GLUCOSE 415* 460* 134* 124* 121* 119*  BUN 88* 91* 99* 106* 114* 122*  CREATININE 2.52* 2.55* 3.15* 3.55* 4.26* 4.53*  ALBUMIN  --  3.2*  --   --   --   --   CALCIUM 8.7 9.0 9.1 9.4 9.3 8.8  AST  --  18  --   --   --   --   ALT  --  19  --   --   --   --    Liver Function Tests:  Recent Labs Lab 12/03/13 0548  AST 18  ALT 19  ALKPHOS 166*  BILITOT 0.5  PROT 8.0  ALBUMIN 3.2*   No results for input(s): LIPASE, AMYLASE in the last 168 hours. No results for input(s): AMMONIA in the last 168 hours. CBC:  Recent Labs Lab 12/03/13 0305 12/03/13 0548  WBC 14.9* 15.2*   HGB 8.7* 9.0*  HCT 26.3* 27.3*  MCV 85.7 85.6  PLT 338 348   Cardiac Enzymes: No results for input(s): CKTOTAL, CKMB, CKMBINDEX, TROPONINI in the last 168 hours. CBG:  Recent Labs Lab 12/06/13 0608 12/06/13 1102 12/06/13 1614 12/06/13 2129 12/07/13 0626  GLUCAP 99 76 73 153* 98    Iron Studies: No results for input(s): IRON, TIBC, TRANSFERRIN, FERRITIN in the last 72 hours. Studies/Results: No results found. Marland Kitchen. amLODipine  10 mg Oral Daily  . antiseptic oral rinse  7 mL Mouth Rinse BID  . aspirin  81 mg Oral Daily  . atorvastatin  80 mg Oral q1800  . carvedilol  12.5 mg Oral BID WC  . Chlorhexidine Gluconate Cloth  6 each Topical Q0600  . clopidogrel  75 mg Oral Q breakfast  . docusate sodium  100 mg Oral BID  . DULoxetine  60 mg Oral Daily  . enoxaparin (LOVENOX) injection  40 mg Subcutaneous Q24H  . furosemide  160 mg Intravenous 3 times per day  . insulin aspart  0-20 Units Subcutaneous TID WC  . insulin aspart  0-5  Units Subcutaneous QHS  . insulin aspart  10 Units Subcutaneous TID WC  . insulin glargine  60 Units Subcutaneous QHS  . l-methylfolate-B6-B12  1 tablet Oral BID  . metoCLOPramide  10 mg Oral TID AC  . mupirocin ointment  1 application Nasal BID  . potassium chloride  40 mEq Oral BID  . sodium chloride  3 mL Intravenous Q12H    BMET    Component Value Date/Time   NA 139 12/07/2013 0355   NA 135 10/10/2013 1128   K 4.1 12/07/2013 0355   CL 99 12/07/2013 0355   CO2 19 12/07/2013 0355   GLUCOSE 119* 12/07/2013 0355   GLUCOSE 264* 10/10/2013 1128   BUN 122* 12/07/2013 0355   BUN 71* 10/10/2013 1128   CREATININE 4.53* 12/07/2013 0355   CALCIUM 8.8 12/07/2013 0355   GFRNONAA 11* 12/07/2013 0355   GFRAA 13* 12/07/2013 0355   CBC    Component Value Date/Time   WBC 15.2* 12/03/2013 0548   RBC 3.19* 12/03/2013 0548   RBC 2.73* 10/11/2012 0400   HGB 9.0* 12/03/2013 0548   HCT 27.3* 12/03/2013 0548   PLT 348 12/03/2013 0548   MCV 85.6  12/03/2013 0548   MCH 28.2 12/03/2013 0548   MCHC 33.0 12/03/2013 0548   RDW 14.5 12/03/2013 0548   LYMPHSABS 1.8 05/27/2013 0310   MONOABS 1.0 05/27/2013 0310   EOSABS 0.2 05/27/2013 0310   BASOSABS 0.0 05/27/2013 0310    Assessment/Plan: Jasmine [name] 43 y.o. female with PMH of CKD4, heart failure with preserved EF (55-60%), anemia of chronic disease, diabetes mellitus on insulin and hypertension; admitted 12/03/2013 for heart failure exacerbation.  1. AKI on CKD4: Baseline creatinine of about 1.9 (labs done in 08/2013). It then went up to the high 2's in September which is where it was on admit but has worsened precipitously since admit. Patient has been diuresed but really not much change in weights and UOP has not been robust.   restarted on 11/12 with lasix 160 TID for continued overload. Peak creatinine of 4.53 today. She tells me that she was taking ibuprofen PTA so that could be contributing but that is now stopped.  She also appeared to have evidence of a UTI on U/A- she denies dysuria but says she had an odor.  Will resend U/A and culture and treat empirically with cipro.  No hypotension. UOP of 1.15L in last 24 hours. She is on potassium 40meq BID. Patient still fluid up. 1. Continue diuresis with lasix 160mg  TID 2. Daily labs- I am very concerned about the trend here, see below 2. Acute on chronic CHF exacerbation: continue with diuresis 3. Anemia of chronic disease/CKD: Hemoglobin of 9.7 in 06/2013. Supposed to receive Aranesp 60mcg sq q2weeks, however, last treatment in September 2015. Hemoglobin stable currently but down slightly from baseline.  Will give aranesp here 4. Diabetes mellitus: on lantus 60u qhs and novolog 10u TID AC with sliding scale qHS. 1. Management per primary 5. HTN: on amlodipine 10mg  and carvedilol 12.5mg . Will watch blood pressures closely for hypotension as that may worsen AKI   Jacquelin Hawkingalph Nettey, MD PGY-2, [facility]  Patient seen and  examined, agree with above note with above modifications. 43 year old BF with pretty advanced CKD at baseline- creatinine 1.9 in August, bumped to the high 2's in September and now hospitalized for volume overload and creatinine worsening daily.  She was taking some ibuprofen and could have a UTI by U/A- ibuprofen has  been stopped.  I will resend urine with culture and empirically treat.  I am also going to check a renal ultrasound.  COntinue diuresis as this is what she needs and hope this will turn around.  She is not uremic today.  I told her that if this doesn't turn around we are looking at dialysis.  She is appropriately upset to hear this news  Annie Sable, MD 12/07/2013    12/07/2013, 7:37 AM

## 2013-12-07 NOTE — Plan of Care (Signed)
Problem: Phase II Progression Outcomes Goal: Pain controlled Outcome: Progressing                  

## 2013-12-07 NOTE — Progress Notes (Signed)
Physical Therapy Treatment Patient Details Name: Jasmine [name] MRN: [id number] DOB: [birth year] Today's Date: 12/07/2013    History of Present Illness 43 yo with history of HTN, CKD stage IV, type II diabetes, and diastolic CHF presented with gradual weight gain and gradual onset of severe exertional dyspnea => dyspnea at rest.  She was noted to be volume overloaded and started on IV Lasix.  Pulmonary edema on CXR.     PT Comments    Patient tolerated extended continuous ambulation this session. Improvements in O2 saturations, ambulated on 3 liters supplemental O2, SpO2 remained >93% with spot checks and 3 standing rest breaks, however, patient with increased DOE and overall significant fatigue post ambulation.   Follow Up Recommendations  Supervision for mobility/OOB     Equipment Recommendations  None recommended by PT    Recommendations for Other Services       Precautions / Restrictions Precautions Precautions: Fall Precaution Comments: decreased O2 saturations Restrictions Weight Bearing Restrictions: No    Mobility  Bed Mobility               General bed mobility comments: received in chair  Transfers Overall transfer level: Needs assistance Equipment used: None Transfers: Sit to/from Stand Sit to Stand: Min guard         General transfer comment: Vcs for hand placement  Ambulation/Gait Ambulation/Gait assistance: Supervision Ambulation Distance (Feet): 310 Feet (with 3 liters O2, in hall ) Assistive device:  (pushing IV pole) Gait Pattern/deviations: Step-through pattern;Decreased stride length;Trunk flexed;Wide base of support;Drifts right/left Gait velocity: decreased Gait velocity interpretation: Below normal speed for age/gender General Gait Details: Improvements in O2 saturations this session. ambulated on 3 liters supplemental O2, SpO2 remained >93% with spot checks and 3 standing rest breaks   Stairs            Wheelchair Mobility     Modified Rankin (Stroke Patients Only)       Balance                                    Cognition Arousal/Alertness: Awake/alert Behavior During Therapy: WFL for tasks assessed/performed Overall Cognitive Status: Within Functional Limits for tasks assessed                      Exercises      General Comments        Pertinent Vitals/Pain      Home Living                      Prior Function            PT Goals (current goals can now be found in the care plan section) Acute Rehab PT Goals Patient Stated Goal: to go home PT Goal Formulation: With patient Time For Goal Achievement: 12/19/13 Potential to Achieve Goals: Good Progress towards PT goals: Progressing toward goals    Frequency  Min 3X/week    PT Plan Current plan remains appropriate    Co-evaluation             End of Session Equipment Utilized During Treatment: Gait belt Activity Tolerance: Patient limited by fatigue Patient left: in chair;with call bell/phone within reach     Time: 1515-1533 PT Time Calculation (min) (ACUTE ONLY): 18 min  Charges:  G CodesFabio Asa:      Serene Kopf J 12/07/2013, 3:38 PM Charlotte Crumbevon Naydelin Ziegler, PT DPT  [phone]

## 2013-12-07 NOTE — Plan of Care (Signed)
Problem: Phase II Progression Outcomes Goal: Walk in hall or up in chair TID Outcome: Progressing     

## 2013-12-07 NOTE — Progress Notes (Signed)
Patient ID: Jasmine [name], female   DOB: 25-Feb-1970, 43 y.o.   MRN: [id number]   SUBJECTIVE: Breathing ok but still on oxygen.  High dose diuretics yesterday per renal service.  Some UOP but not vigorous.    Scheduled Meds: . amLODipine  10 mg Oral Daily  . antiseptic oral rinse  7 mL Mouth Rinse BID  . aspirin  81 mg Oral Daily  . atorvastatin  80 mg Oral q1800  . carvedilol  12.5 mg Oral BID WC  . Chlorhexidine Gluconate Cloth  6 each Topical Q0600  . clopidogrel  75 mg Oral Q breakfast  . docusate sodium  100 mg Oral BID  . DULoxetine  60 mg Oral Daily  . enoxaparin (LOVENOX) injection  40 mg Subcutaneous Q24H  . furosemide  160 mg Intravenous 3 times per day  . insulin aspart  0-20 Units Subcutaneous TID WC  . insulin aspart  0-5 Units Subcutaneous QHS  . insulin aspart  10 Units Subcutaneous TID WC  . insulin glargine  60 Units Subcutaneous QHS  . l-methylfolate-B6-B12  1 tablet Oral BID  . metoCLOPramide  10 mg Oral TID AC  . mupirocin ointment  1 application Nasal BID  . potassium chloride  40 mEq Oral BID  . sodium chloride  3 mL Intravenous Q12H   Continuous Infusions: . sodium chloride Stopped (12/03/13 1935)  . dextrose 5 % and 0.45% NaCl 10 mL/hr at 12/03/13 1935   PRN Meds:.sodium chloride, acetaminophen, albuterol, HYDROcodone-acetaminophen, nitroGLYCERIN, ondansetron (ZOFRAN) IV, polyethylene glycol, sodium chloride, traMADol    Filed Vitals:   12/06/13 1500 12/06/13 2044 12/07/13 0631 12/07/13 0848  BP: 143/73 129/70 142/75 135/72  Pulse: 78 73 79 71  Temp:  98.2 F (36.8 C) 98.1 F (36.7 C)   TempSrc:  Oral Oral   Resp: 18 16 17    Height:      Weight:   289 lb 8 oz (131.316 kg)   SpO2: 92% 94% 97% 97%    Intake/Output Summary (Last 24 hours) at 12/07/13 0947 Last data filed at 12/07/13 0631  Gross per 24 hour  Intake    343 ml  Output   1150 ml  Net   -807 ml    LABS: Basic Metabolic Panel:  Recent Labs  27/25/3610/01/08 0403 12/07/13 0355  NA  135* 139  K 4.0 4.1  CL 95* 99  CO2 20 19  GLUCOSE 121* 119*  BUN 114* 122*  CREATININE 4.26* 4.53*  CALCIUM 9.3 8.8   Liver Function Tests: No results for input(s): AST, ALT, ALKPHOS, BILITOT, PROT, ALBUMIN in the last 72 hours. No results for input(s): LIPASE, AMYLASE in the last 72 hours. CBC: No results for input(s): WBC, NEUTROABS, HGB, HCT, MCV, PLT in the last 72 hours. Cardiac Enzymes: No results for input(s): CKTOTAL, CKMB, CKMBINDEX, TROPONINI in the last 72 hours. BNP: Invalid input(s): POCBNP D-Dimer: No results for input(s): DDIMER in the last 72 hours. Hemoglobin A1C: No results for input(s): HGBA1C in the last 72 hours. Fasting Lipid Panel: No results for input(s): CHOL, HDL, LDLCALC, TRIG, CHOLHDL, LDLDIRECT in the last 72 hours. Thyroid Function Tests: No results for input(s): TSH, T4TOTAL, T3FREE, THYROIDAB in the last 72 hours.  Invalid input(s): FREET3 Anemia Panel: No results for input(s): VITAMINB12, FOLATE, FERRITIN, TIBC, IRON, RETICCTPCT in the last 72 hours.  RADIOLOGY: Dg Chest 2 View  12/03/2013   CLINICAL DATA:  Shortness of breath.  EXAM: CHEST  2 VIEW  COMPARISON:  Single view  of the chest 03/24/2013, 04/01/2013 and 03/24/2013.  FINDINGS: Bilateral airspace disease has an appearance most consistent with pulmonary edema. Heart size is upper normal. No pleural effusion or pneumothorax.  IMPRESSION: Bilateral airspace disease has an appearance most consistent with pulmonary edema.   Electronically Signed   By: Drusilla Kannerhomas  Dalessio M.D.   On: 12/03/2013 03:56    PHYSICAL EXAM General: NAD Neck: Thick, JVP 12 cm, no thyromegaly or thyroid nodule.  Lungs: Crackles bilateral bases. CV: Nondisplaced PMI.  Heart regular S1/S2, no S3/S4, 2/6 early SEM RUSB.  1+ ankle edema bilaterally.   Abdomen: Soft, nontender, no hepatosplenomegaly, no distention.  Neurologic: Alert and oriented x 3.  Psych: Normal affect. Extremities: No clubbing or cyanosis.    TELEMETRY: Reviewed telemetry pt in NSR  ASSESSMENT AND PLAN: 43 yo with history of HTN, CKD stage IV, type II diabetes, and diastolic CHF presented with gradual weight gain and gradual onset of severe exertional dyspnea => dyspnea at rest.  She was noted to be volume overloaded and started on IV Lasix.  Pulmonary edema on CXR.  1. Acute on chronic diastolic CHF: Last echo in 5/15 with EF 55-60% and moderate LVH.  Suspect she has had a gradual fluid accumulation over time based on symptoms and weight rise.  Our ability to diurese her is limited by cardiorenal syndrome. Renal service now following, high dose diuretics yesterday with some increased UOP but not marked.  BUN/creatinine up again.  She still is volume overloaded on exam with JVD and requires oxygen.  There may be a component of obesity-hypoventilation syndrome, but she has not needed oxygen in the past.  - Diuretics per renal service, appreciate assistance.    2. AKI on CKD: BUN/creatinine up again.  She is volume overloaded and hypoxic but having a difficult time managing with diuretics given renal dysfunction.  Renal service following.  3. HTN: Stable.  4. Nonobstructive CAD: Troponin not elevated, continue statin.  5. OSA: Using CPAP at night.  6. Ambulate/out of bed.   Marca AnconaDalton Joshlyn Beadle 12/07/2013 9:47 AM

## 2013-12-08 DIAGNOSIS — I110 Hypertensive heart disease with heart failure: Secondary | ICD-10-CM

## 2013-12-08 DIAGNOSIS — N184 Chronic kidney disease, stage 4 (severe): Secondary | ICD-10-CM

## 2013-12-08 DIAGNOSIS — I509 Heart failure, unspecified: Secondary | ICD-10-CM

## 2013-12-08 LAB — GLUCOSE, CAPILLARY
GLUCOSE-CAPILLARY: 77 mg/dL (ref 70–99)
GLUCOSE-CAPILLARY: 108 mg/dL — AB (ref 70–99)
GLUCOSE-CAPILLARY: 153 mg/dL — AB (ref 70–99)
Glucose-Capillary: 143 mg/dL — ABNORMAL HIGH (ref 70–99)

## 2013-12-08 LAB — RENAL FUNCTION PANEL
Albumin: 2.9 g/dL — ABNORMAL LOW (ref 3.5–5.2)
Anion gap: 21 — ABNORMAL HIGH (ref 5–15)
BUN: 123 mg/dL — AB (ref 6–23)
CHLORIDE: 98 meq/L (ref 96–112)
CO2: 20 meq/L (ref 19–32)
Calcium: 9.1 mg/dL (ref 8.4–10.5)
Creatinine, Ser: 4 mg/dL — ABNORMAL HIGH (ref 0.50–1.10)
GFR calc Af Amer: 15 mL/min — ABNORMAL LOW (ref >90)
GFR calc non Af Amer: 13 mL/min — ABNORMAL LOW (ref >90)
Glucose, Bld: 73 mg/dL (ref 70–99)
Phosphorus: 5.9 mg/dL — ABNORMAL HIGH (ref 2.3–4.6)
Potassium: 3.9 mEq/L (ref 3.7–5.3)
Sodium: 139 mEq/L (ref 137–147)

## 2013-12-08 MED ORDER — SORBITOL 70 % SOLN
15.0000 mL | Freq: Once | Status: AC
  Administered 2013-12-08: 15 mL via ORAL
  Filled 2013-12-08: qty 30

## 2013-12-08 MED ORDER — AMLODIPINE BESYLATE 5 MG PO TABS
5.0000 mg | ORAL_TABLET | Freq: Every day | ORAL | Status: DC
  Administered 2013-12-08 – 2013-12-12 (×5): 5 mg via ORAL
  Filled 2013-12-08 (×5): qty 1

## 2013-12-08 MED ORDER — BISACODYL 5 MG PO TBEC
10.0000 mg | DELAYED_RELEASE_TABLET | Freq: Once | ORAL | Status: AC
  Administered 2013-12-08: 10 mg via ORAL
  Filled 2013-12-08: qty 2

## 2013-12-08 NOTE — Progress Notes (Signed)
Patient ID: Jasmine [name], female    DOB: 01-27-1970, 43 y.o.   MRN: [id number] Nephrology Progress Note  S: Thankfully looks like creatinine is trending down for real- BUN stable- not uremic- making good urine and diuresing on IV lasix 160 TID- complains of constipation   O:BP 127/52 mmHg  Pulse 72  Temp(Src) 98 F (36.7 C) (Oral)  Resp 18  Ht 5\' 7"  (1.702 m)  Wt 130.9 kg (288 lb 9.3 oz)  BMI 45.19 kg/m2  SpO2 98%  Intake/Output Summary (Last 24 hours) at 12/08/13 0918 Last data filed at 12/07/13 2300  Gross per 24 hour  Intake    810 ml  Output   1550 ml  Net   -740 ml   Intake/Output: I/O last 3 completed shifts: In: 1050 [P.O.:720; IV Piggyback:330] Out: 2300 [Urine:2300]  Intake/Output this shift:    Weight change: -0.416 kg (-14.7 oz) FAO:ZHYQGen:Fair appearing female, obese, no distress MVH:QIONGEXCVS:Regular rate and rhythm, no murmur Resp: bilateral rales, improved from yesterday BMW:UXLKAbd:Soft, non-tender, non-distended Ext:1+ edema bilaterally up to shins   Recent Labs Lab 12/03/13 0305 12/03/13 0548 12/04/13 0408 12/05/13 0404 12/06/13 0403 12/07/13 0355 12/08/13 0511  NA 133* 133* 137 136* 135* 139 139  K 3.6* 3.6* 3.1* 3.6* 4.0 4.1 3.9  CL 94* 94* 96 95* 95* 99 98  CO2 20 18* 21 18* 20 19 20   GLUCOSE 415* 460* 134* 124* 121* 119* 73  BUN 88* 91* 99* 106* 114* 122* 123*  CREATININE 2.52* 2.55* 3.15* 3.55* 4.26* 4.53* 4.00*  ALBUMIN  --  3.2*  --   --   --   --  2.9*  CALCIUM 8.7 9.0 9.1 9.4 9.3 8.8 9.1  PHOS  --   --   --   --   --   --  5.9*  AST  --  18  --   --   --   --   --   ALT  --  19  --   --   --   --   --    Liver Function Tests:  Recent Labs Lab 12/03/13 0548 12/08/13 0511  AST 18  --   ALT 19  --   ALKPHOS 166*  --   BILITOT 0.5  --   PROT 8.0  --   ALBUMIN 3.2* 2.9*   No results for input(s): LIPASE, AMYLASE in the last 168 hours. No results for input(s): AMMONIA in the last 168 hours. CBC:  Recent Labs Lab 12/03/13 0305 12/03/13 0548   WBC 14.9* 15.2*  HGB 8.7* 9.0*  HCT 26.3* 27.3*  MCV 85.7 85.6  PLT 338 348   Cardiac Enzymes: No results for input(s): CKTOTAL, CKMB, CKMBINDEX, TROPONINI in the last 168 hours. CBG:  Recent Labs Lab 12/07/13 1132 12/07/13 1218 12/07/13 1623 12/07/13 2155 12/08/13 0719  GLUCAP 64* 96 142* 105* 77    Iron Studies: No results for input(s): IRON, TIBC, TRANSFERRIN, FERRITIN in the last 72 hours. Studies/Results: [facility]  12/07/2013   CLINICAL DATA:  Acute renal insufficiency. Morbid obesity with chronic kidney disease and diabetes mellitus.  EXAM: RENAL/URINARY TRACT ULTRASOUND COMPLETE  COMPARISON:  CT abdomen pelvis 03/25/2013  FINDINGS: Right Kidney:  Length: 11.6 cm in sagittal length. There is mild diffuse thinning of the renal cortex. The echogenicity of the cortex appears slightly increased. Negative for hydronephrosis. No evidence of mass.  Left Kidney:  Length: 12.5 cm in sagittal length. Cortical thickness appears within normal limits. Echogenicity of  the cortex appears borderline increased. Negative for hydronephrosis. No evidence of mass.  Bladder:  Appears normal for degree of bladder distention.  IMPRESSION: Negative for hydronephrosis.  Mild thinning of the right renal cortex and borderline to mild increased echogenicity of the renal cortices bilaterally, a finding which can be seen with chronic medical renal disease.   Electronically Signed   By: Britta MccreedySusan  Turner M.D.   On: 12/07/2013 14:54   . amLODipine  10 mg Oral Daily  . antiseptic oral rinse  7 mL Mouth Rinse BID  . aspirin  81 mg Oral Daily  . atorvastatin  80 mg Oral q1800  . carvedilol  12.5 mg Oral BID WC  . Chlorhexidine Gluconate Cloth  6 each Topical Q0600  . ciprofloxacin  500 mg Oral Q1200  . clopidogrel  75 mg Oral Q breakfast  . darbepoetin (ARANESP) injection - NON-DIALYSIS  60 mcg Subcutaneous Q Fri-1800  . docusate sodium  100 mg Oral BID  . DULoxetine  60 mg Oral Daily  . enoxaparin (LOVENOX)  injection  40 mg Subcutaneous Q24H  . furosemide  160 mg Intravenous 3 times per day  . insulin aspart  0-20 Units Subcutaneous TID WC  . insulin aspart  0-5 Units Subcutaneous QHS  . insulin aspart  10 Units Subcutaneous TID WC  . insulin glargine  60 Units Subcutaneous QHS  . l-methylfolate-B6-B12  1 tablet Oral BID  . metoCLOPramide  10 mg Oral TID AC  . mupirocin ointment  1 application Nasal BID  . potassium chloride  40 mEq Oral BID  . sodium chloride  3 mL Intravenous Q12H      Assessment/Plan: Tiffanyann L Bickham 43 y.o. female with PMH of CKD4, heart failure with preserved EF (55-60%), anemia of chronic disease, diabetes mellitus on insulin and hypertension; admitted 12/03/2013 for heart failure exacerbation.  1. AKI on CKD4: Baseline creatinine of about 1.9 (labs done in 08/2013). It then went up to the high 2's in September which is where it was on admit but has worsened precipitously since admit. Patient has been diuresed but really not much change in weights and UOP has not been robust.   Restarted on 11/12 with lasix 160 TID for continued overload. Peak creatinine of 4.53 now down to 4.0  today. She tells me that she was taking ibuprofen PTA so that could have been contributing but that is now stopped.  She also appeared to have evidence of a UTI on U/A- she denies dysuria but says she had an odor. Culture pending,  treating empirically with cipro.  Ultrasound negative for obstruction and show good sized kidneys.  No hypotension. UOP of 1.8L in last 24 hours. She is on potassium 40meq BID. Patient still fluid up. 1. Continue diuresis with lasix 160mg  TID 2. Daily labs-  2. Acute on chronic CHF exacerbation: continue with diuresis 3. Anemia of chronic disease/CKD: Hemoglobin of 9.7 in 06/2013. Supposed to receive Aranesp 60mcg sq q2weeks, however, last treatment in September 2015. Hemoglobin  currently but down slightly from baseline.  Will give aranesp here and recheck CBC in  AM 4. Diabetes mellitus: on lantus 60u qhs and novolog 10u TID AC with sliding scale qHS. 1. Management per primary 5. HTN: on amlodipine 10mg  and carvedilol 12.5mg . Will take amlodipine down to 5 as I feel when volume status improves BP will go down 6. Constipation- give dulcolax and sorbitol     Annie SableKellie Rock Sobol, MD 12/08/2013    12/08/2013, 9:18 AM

## 2013-12-08 NOTE — Progress Notes (Signed)
Pt ambulated approx 100 ft in hallway without oxygen. Sats upon returning to room 99%

## 2013-12-08 NOTE — Progress Notes (Signed)
Patient stated she places herself on CPAP before bed and doesn't need assistance.

## 2013-12-08 NOTE — Progress Notes (Signed)
Patient ID: Jasmine [name], female   DOB: 16-Apr-1970, 43 y.o.   MRN: [id number]   SUBJECTIVE: SOB is better.  UO a little better with high dose lasix.  No chest pain.  No cardiac issues.  Scheduled Meds: . amLODipine  5 mg Oral Daily  . antiseptic oral rinse  7 mL Mouth Rinse BID  . aspirin  81 mg Oral Daily  . atorvastatin  80 mg Oral q1800  . carvedilol  12.5 mg Oral BID WC  . Chlorhexidine Gluconate Cloth  6 each Topical Q0600  . ciprofloxacin  500 mg Oral Q1200  . clopidogrel  75 mg Oral Q breakfast  . darbepoetin (ARANESP) injection - NON-DIALYSIS  60 mcg Subcutaneous Q Fri-1800  . docusate sodium  100 mg Oral BID  . DULoxetine  60 mg Oral Daily  . enoxaparin (LOVENOX) injection  40 mg Subcutaneous Q24H  . furosemide  160 mg Intravenous 3 times per day  . insulin aspart  0-20 Units Subcutaneous TID WC  . insulin aspart  0-5 Units Subcutaneous QHS  . insulin aspart  10 Units Subcutaneous TID WC  . insulin glargine  60 Units Subcutaneous QHS  . l-methylfolate-B6-B12  1 tablet Oral BID  . metoCLOPramide  10 mg Oral TID AC  . potassium chloride  40 mEq Oral BID  . sodium chloride  3 mL Intravenous Q12H   Continuous Infusions: . sodium chloride Stopped (12/03/13 1935)  . dextrose 5 % and 0.45% NaCl 10 mL/hr at 12/03/13 1935   PRN Meds:.sodium chloride, acetaminophen, albuterol, HYDROcodone-acetaminophen, nitroGLYCERIN, ondansetron (ZOFRAN) IV, polyethylene glycol, sodium chloride, traMADol    Filed Vitals:   12/07/13 0848 12/07/13 1500 12/07/13 2157 12/08/13 0458  BP: 135/72 140/70 161/82 127/52  Pulse: 71 70 75 72  Temp:  98 F (36.7 C) 97.8 F (36.6 C) 98 F (36.7 C)  TempSrc:  Oral Oral Oral  Resp:  18 17 18   Height:      Weight:    288 lb 9.3 oz (130.9 kg)  SpO2: 97% 97% 97% 98%    Intake/Output Summary (Last 24 hours) at 12/08/13 1116 Last data filed at 12/08/13 0944  Gross per 24 hour  Intake   1170 ml  Output   1550 ml  Net   -380 ml    LABS: Basic  Metabolic Panel:  Recent Labs  86/57/8411/13/15 0355 12/08/13 0511  NA 139 139  K 4.1 3.9  CL 99 98  CO2 19 20  GLUCOSE 119* 73  BUN 122* 123*  CREATININE 4.53* 4.00*  CALCIUM 8.8 9.1  PHOS  --  5.9*   Liver Function Tests:  Recent Labs  12/08/13 0511  ALBUMIN 2.9*   No results for input(s): LIPASE, AMYLASE in the last 72 hours.   PHYSICAL EXAM General: NAD, obese Neck: supple, JVP 10 cm, no thyromegaly or thyroid nodule.  Lungs: decreased BS at the bases. CV: Nondisplaced PMI.  Heart regular S1/S2, no S3/S4, 2/6 early SEM RUSB.  1+ ankle edema bilaterally.   Abdomen: Soft, nontender, no hepatosplenomegaly, no distention.  Neurologic: Alert and oriented x 3.  Psych: Full affect. Extremities: No clubbing or cyanosis.   TELEMETRY: Reviewed telemetry pt in NSR  ASSESSMENT AND PLAN: 43 yo with history of HTN, CKD stage IV, type II diabetes, morbid obesity, and diastolic CHF presented with advanced renal failure and associated volume overload.  1. Acute on chronic renal failure- she has severe renal impairment which is her main issue.  Nephrology is following.  Attempts at high dose lasix are being made with some improvement.  I would anticipate that there is a very high likelihood that she will require dialysis.  This is a very complex issue and her risks of decompensation/ repeat hospitalization is quite high.  2. Acute on chronic diastolic CHF: Last echo in 5/15 with EF 55-60% and moderate LVH.   I do not think that she has a primary cardiac issue.  Her volume overload is primarily renal in nature and her heart is probably doing its best to compensate.  Hypertension and morbid obesity contribute.  3. Obesity. Body mass index is 45.19 kg/(m^2). Weight loss will be necessary for her to do well long term  4. Hypertensive cardiovascular disease with CHF BP is stable  5. Nonobstructive CAD: Troponin not elevated, continue statin.   5. OSA: Using CPAP at night.    Hillis Rangellred,  Idy Rawling 12/08/2013 11:16 AM

## 2013-12-09 DIAGNOSIS — E1129 Type 2 diabetes mellitus with other diabetic kidney complication: Secondary | ICD-10-CM

## 2013-12-09 LAB — RENAL FUNCTION PANEL
ALBUMIN: 3 g/dL — AB (ref 3.5–5.2)
Anion gap: 21 — ABNORMAL HIGH (ref 5–15)
BUN: 120 mg/dL — ABNORMAL HIGH (ref 6–23)
CALCIUM: 9.3 mg/dL (ref 8.4–10.5)
CO2: 20 mEq/L (ref 19–32)
Chloride: 96 mEq/L (ref 96–112)
Creatinine, Ser: 3.67 mg/dL — ABNORMAL HIGH (ref 0.50–1.10)
GFR, EST AFRICAN AMERICAN: 16 mL/min — AB (ref >90)
GFR, EST NON AFRICAN AMERICAN: 14 mL/min — AB (ref >90)
Glucose, Bld: 66 mg/dL — ABNORMAL LOW (ref 70–99)
PHOSPHORUS: 5.4 mg/dL — AB (ref 2.3–4.6)
POTASSIUM: 3.5 meq/L — AB (ref 3.7–5.3)
SODIUM: 137 meq/L (ref 137–147)

## 2013-12-09 LAB — CBC
HCT: 26.4 % — ABNORMAL LOW (ref 36.0–46.0)
Hemoglobin: 8.5 g/dL — ABNORMAL LOW (ref 12.0–15.0)
MCH: 28.7 pg (ref 26.0–34.0)
MCHC: 32.2 g/dL (ref 30.0–36.0)
MCV: 89.2 fL (ref 78.0–100.0)
Platelets: 373 10*3/uL (ref 150–400)
RBC: 2.96 MIL/uL — ABNORMAL LOW (ref 3.87–5.11)
RDW: 15.1 % (ref 11.5–15.5)
WBC: 7 10*3/uL (ref 4.0–10.5)

## 2013-12-09 LAB — URINE CULTURE: Colony Count: >=100000

## 2013-12-09 LAB — GLUCOSE, CAPILLARY
GLUCOSE-CAPILLARY: 62 mg/dL — AB (ref 70–99)
GLUCOSE-CAPILLARY: 157 mg/dL — AB (ref 70–99)
Glucose-Capillary: 76 mg/dL (ref 70–99)
Glucose-Capillary: 182 mg/dL — ABNORMAL HIGH (ref 70–99)
Glucose-Capillary: 201 mg/dL — ABNORMAL HIGH (ref 70–99)

## 2013-12-09 NOTE — Progress Notes (Signed)
Hypoglycemic Event  CBG:62  Treatment: 4oz orange juice  Symptoms: asymptomatic  Follow-up CBG: Time:7:15 CBG Result:76  Possible Reasons for Event:poor diet   Comments/MD notified: Patient reminded to eat breakfast    Crista CurbJones Jr, Mariann BarterFrederick Alonzo  Remember to initiate Hypoglycemia Order Set & complete

## 2013-12-09 NOTE — Progress Notes (Signed)
Patient ID: Jasmine [name], female   DOB: 08/26/1970, 43 y.o.   MRN: [id number]   SUBJECTIVE: SOB is much improved.    No chest pain.  No acute cardiac issues.  Scheduled Meds: . amLODipine  5 mg Oral Daily  . antiseptic oral rinse  7 mL Mouth Rinse BID  . aspirin  81 mg Oral Daily  . atorvastatin  80 mg Oral q1800  . carvedilol  12.5 mg Oral BID WC  . ciprofloxacin  500 mg Oral Q1200  . clopidogrel  75 mg Oral Q breakfast  . darbepoetin (ARANESP) injection - NON-DIALYSIS  60 mcg Subcutaneous Q Fri-1800  . docusate sodium  100 mg Oral BID  . DULoxetine  60 mg Oral Daily  . enoxaparin (LOVENOX) injection  40 mg Subcutaneous Q24H  . furosemide  160 mg Intravenous 3 times per day  . insulin aspart  0-20 Units Subcutaneous TID WC  . insulin aspart  0-5 Units Subcutaneous QHS  . insulin aspart  10 Units Subcutaneous TID WC  . insulin glargine  60 Units Subcutaneous QHS  . l-methylfolate-B6-B12  1 tablet Oral BID  . metoCLOPramide  10 mg Oral TID AC  . potassium chloride  40 mEq Oral BID  . sodium chloride  3 mL Intravenous Q12H   Continuous Infusions: . sodium chloride Stopped (12/03/13 1935)  . dextrose 5 % and 0.45% NaCl 10 mL/hr at 12/03/13 1935   PRN Meds:.sodium chloride, acetaminophen, albuterol, HYDROcodone-acetaminophen, nitroGLYCERIN, ondansetron (ZOFRAN) IV, polyethylene glycol, sodium chloride, traMADol    Filed Vitals:   12/08/13 0458 12/08/13 1626 12/08/13 2135 12/09/13 0526  BP: 127/52 140/75 151/68 153/77  Pulse: 72 73 74 73  Temp: 98 F (36.7 C) 98.2 F (36.8 C) 98.1 F (36.7 C) 97.9 F (36.6 C)  TempSrc: Oral Oral Oral Oral  Resp: 18  16 16   Height:      Weight: 288 lb 9.3 oz (130.9 kg)   277 lb 12.5 oz (126 kg)  SpO2: 98% 96% 98% 98%    Intake/Output Summary (Last 24 hours) at 12/09/13 0947 Last data filed at 12/09/13 0526  Gross per 24 hour  Intake    840 ml  Output   2100 ml  Net  -1260 ml    LABS: Basic Metabolic Panel:  Recent Labs  86/57/8411/14/15 0511 12/09/13 0326  NA 139 137  K 3.9 3.5*  CL 98 96  CO2 20 20  GLUCOSE 73 66*  BUN 123* 120*  CREATININE 4.00* 3.67*  CALCIUM 9.1 9.3  PHOS 5.9* 5.4*   Liver Function Tests:  Recent Labs  12/08/13 0511 12/09/13 0326  ALBUMIN 2.9* 3.0*   No results for input(s): LIPASE, AMYLASE in the last 72 hours.   PHYSICAL EXAM General: NAD, obese Neck: supple, JVP 10 cm, no thyromegaly or thyroid nodule.  Lungs: decreased BS at the bases. CV: Nondisplaced PMI.  Heart regular S1/S2, no S3/S4, 2/6 early SEM RUSB.  1+ ankle edema bilaterally.   Abdomen: Soft, nontender, no hepatosplenomegaly, no distention.  Neurologic: Alert and oriented x 3.  Psych: Full affect. Extremities: No clubbing or cyanosis.   TELEMETRY: Reviewed telemetry pt in NSR  ASSESSMENT AND PLAN: 43 yo with history of HTN, CKD stage IV, type II diabetes, morbid obesity, and diastolic CHF presented with advanced renal failure and associated volume overload.  1. Acute on chronic renal failure- she has severe renal impairment which is her main issue.  Nephrology is following.  Creatinine is improving with high dose  lasix I would make no changes at this point  2. Acute on chronic diastolic CHF: Last echo in 5/15 with EF 55-60% and moderate LVH.   Her volume overload is primarily renal in nature and her heart is probably doing its best to compensate.  Hypertension and morbid obesity contribute.  3. Obesity. Body mass index is 43.5 kg/(m^2). Weight loss will be necessary for her to do well long term  4. Hypertensive cardiovascular disease with CHF BP is stable  5. Nonobstructive CAD:   continue statin.   5. OSA: Using CPAP at night.    Hillis Rangellred, Samatha Anspach 12/09/2013 9:47 AM

## 2013-12-09 NOTE — Progress Notes (Signed)
Patient ID: Chanel L Kotowski, female    DOB: [birth year], 43 y.o.   MRN: [id number] Nephrology Progress Note  S: Thankfully looks like creatinine is trending down for real- BUN stable to improved- a little transient nausea but not overly uremic- making pretty good urine and diuresing on IV lasix 160 TID-    O:BP 153/77 mmHg  Pulse 73  Temp(Src) 97.9 F (36.6 C) (Oral)  Resp 16  Ht 5\' 7"  (1.702 m)  Wt 126 kg (277 lb 12.5 oz)  BMI 43.50 kg/m2  SpO2 98%  Intake/Output Summary (Last 24 hours) at 12/09/13 0906 Last data filed at 12/09/13 0526  Gross per 24 hour  Intake   1200 ml  Output   2100 ml  Net   -900 ml   Intake/Output: I/O last 3 completed shifts: In: 1530 [P.O.:1200; IV Piggyback:330] Out: 2800 [Urine:2800]  Intake/Output this shift:    Weight change: -4.9 kg (-10 lb 12.8 oz) XBM:WUXL appearing female, obese, no distress KGM:WNUUVOZ rate and rhythm, no murmur Resp: bilateral rales, improved from yesterday DGU:YQIH, non-tender, non-distended Ext:1+ edema bilaterally up to shins   Recent Labs Lab 12/03/13 0548 12/04/13 0408 12/05/13 0404 12/06/13 0403 12/07/13 0355 12/08/13 0511 12/09/13 0326  NA 133* 137 136* 135* 139 139 137  K 3.6* 3.1* 3.6* 4.0 4.1 3.9 3.5*  CL 94* 96 95* 95* 99 98 96  CO2 18* 21 18* 20 19 20 20   GLUCOSE 460* 134* 124* 121* 119* 73 66*  BUN 91* 99* 106* 114* 122* 123* 120*  CREATININE 2.55* 3.15* 3.55* 4.26* 4.53* 4.00* 3.67*  ALBUMIN 3.2*  --   --   --   --  2.9* 3.0*  CALCIUM 9.0 9.1 9.4 9.3 8.8 9.1 9.3  PHOS  --   --   --   --   --  5.9* 5.4*  AST 18  --   --   --   --   --   --   ALT 19  --   --   --   --   --   --    Liver Function Tests:  Recent Labs Lab 12/03/13 0548 12/08/13 0511 12/09/13 0326  AST 18  --   --   ALT 19  --   --   ALKPHOS 166*  --   --   BILITOT 0.5  --   --   PROT 8.0  --   --   ALBUMIN 3.2* 2.9* 3.0*   No results for input(s): LIPASE, AMYLASE in the last 168 hours. No results for input(s): AMMONIA  in the last 168 hours. CBC:  Recent Labs Lab 12/03/13 0305 12/03/13 0548 12/09/13 0326  WBC 14.9* 15.2* 7.0  HGB 8.7* 9.0* 8.5*  HCT 26.3* 27.3* 26.4*  MCV 85.7 85.6 89.2  PLT 338 348 373   Cardiac Enzymes: No results for input(s): CKTOTAL, CKMB, CKMBINDEX, TROPONINI in the last 168 hours. CBG:  Recent Labs Lab 12/08/13 1059 12/08/13 1705 12/08/13 2133 12/09/13 0636 12/09/13 0715  GLUCAP 108* 153* 143* 62* 76    Iron Studies: No results for input(s): IRON, TIBC, TRANSFERRIN, FERRITIN in the last 72 hours. Studies/Results: US Renal  12/07/2013   CLINICAL DATA:  Acute renal insufficiency. Morbid obesity with chronic kidney disease and diabetes mellitus.  EXAM: RENAL/URINARY TRACT ULTRASOUND COMPLETE  COMPARISON:  CT abdomen pelvis 03/25/2013  FINDINGS: Right Kidney:  Length: 11.6 cm in sagittal length. There is mild diffuse thinning of the renal cortex. The echogenicity of the  cortex appears slightly increased. Negative for hydronephrosis. No evidence of mass.  Left Kidney:  Length: 12.5 cm in sagittal length. Cortical thickness appears within normal limits. Echogenicity of the cortex appears borderline increased. Negative for hydronephrosis. No evidence of mass.  Bladder:  Appears normal for degree of bladder distention.  IMPRESSION: Negative for hydronephrosis.  Mild thinning of the right renal cortex and borderline to mild increased echogenicity of the renal cortices bilaterally, a finding which can be seen with chronic medical renal disease.   Electronically Signed   By: Britta MccreedySusan  Turner M.D.   On: 12/07/2013 14:54   . amLODipine  5 mg Oral Daily  . antiseptic oral rinse  7 mL Mouth Rinse BID  . aspirin  81 mg Oral Daily  . atorvastatin  80 mg Oral q1800  . carvedilol  12.5 mg Oral BID WC  . ciprofloxacin  500 mg Oral Q1200  . clopidogrel  75 mg Oral Q breakfast  . darbepoetin (ARANESP) injection - NON-DIALYSIS  60 mcg Subcutaneous Q Fri-1800  . docusate sodium  100 mg Oral  BID  . DULoxetine  60 mg Oral Daily  . enoxaparin (LOVENOX) injection  40 mg Subcutaneous Q24H  . furosemide  160 mg Intravenous 3 times per day  . insulin aspart  0-20 Units Subcutaneous TID WC  . insulin aspart  0-5 Units Subcutaneous QHS  . insulin aspart  10 Units Subcutaneous TID WC  . insulin glargine  60 Units Subcutaneous QHS  . l-methylfolate-B6-B12  1 tablet Oral BID  . metoCLOPramide  10 mg Oral TID AC  . potassium chloride  40 mEq Oral BID  . sodium chloride  3 mL Intravenous Q12H      Assessment/Plan: Shamiyah L Furio 43 y.o. female with PMH of CKD4, heart failure with preserved EF (55-60%), anemia of chronic disease, diabetes mellitus on insulin and hypertension; admitted 12/03/2013 for heart failure exacerbation.  1. AKI on CKD4: Baseline creatinine of about 1.9 (labs done in 08/2013). It then went up to the high 2's in September which is where it was on admit but has worsened precipitously since admit. Patient has been diuresed but really not much change in weights and UOP has not been robust.   Restarted on 11/12 with lasix 160 TID for continued overload. Peak creatinine of 4.53 now down to 3.6  today. She tells me that she was taking ibuprofen PTA so that could have been contributing but that is now stopped.  She also had UTI e coli- sens not back yet-   treating empirically with cipro.  Ultrasound negative for obstruction and show good sized kidneys.  No hypotension. UOP of 2.1L in last 24 hours. Patient still fluid up- some- she says dry weight about 265- is 277 today. 1. Continue diuresis with lasix IV 160mg  TID 2. Daily labs- antiipate change over to PO lasix soon followed by discharge and close follow up 2. Acute on chronic CHF exacerbation: continue with diuresis 3. Anemia of chronic disease/CKD: Hemoglobin of 9.7 in 06/2013. Supposed to receive Aranesp 60mcg sq q2weeks, however, last treatment in September 2015.   Will give aranesp here and also check iron  stores 4. Diabetes mellitus: on lantus 60u qhs and novolog 10u TID AC with sliding scale qHS. 1. Management per primary 5. HTN: on amlodipine 10mg  and carvedilol 12.5mg . Have taken amlodipine down to 5 as I feel when volume status improves BP will go down 6. Constipation- gave dulcolax and sorbitol 7. On K 40 BID so  far is tolerating     Annie SableKellie Jagger Demonte, MD 12/09/2013    12/09/2013, 9:06 AM

## 2013-12-09 NOTE — Progress Notes (Signed)
Patient has stated she can place herself on and off CPAP as needed. Patient is aware to call if she needs any help at all with the machine. RT will continue to assist as needed.

## 2013-12-10 LAB — IRON AND TIBC
IRON: 43 ug/dL (ref 42–135)
Saturation Ratios: 19 % — ABNORMAL LOW (ref 20–55)
TIBC: 232 ug/dL — AB (ref 250–470)
UIBC: 189 ug/dL (ref 125–400)

## 2013-12-10 LAB — RENAL FUNCTION PANEL
Albumin: 3 g/dL — ABNORMAL LOW (ref 3.5–5.2)
Anion gap: 17 — ABNORMAL HIGH (ref 5–15)
BUN: 113 mg/dL — ABNORMAL HIGH (ref 6–23)
CHLORIDE: 98 meq/L (ref 96–112)
CO2: 25 meq/L (ref 19–32)
Calcium: 9.1 mg/dL (ref 8.4–10.5)
Creatinine, Ser: 3.43 mg/dL — ABNORMAL HIGH (ref 0.50–1.10)
GFR calc Af Amer: 18 mL/min — ABNORMAL LOW (ref >90)
GFR, EST NON AFRICAN AMERICAN: 15 mL/min — AB (ref >90)
Glucose, Bld: 162 mg/dL — ABNORMAL HIGH (ref 70–99)
POTASSIUM: 3.7 meq/L (ref 3.7–5.3)
Phosphorus: 5.5 mg/dL — ABNORMAL HIGH (ref 2.3–4.6)
Sodium: 140 mEq/L (ref 137–147)

## 2013-12-10 LAB — GLUCOSE, CAPILLARY
GLUCOSE-CAPILLARY: 129 mg/dL — AB (ref 70–99)
GLUCOSE-CAPILLARY: 53 mg/dL — AB (ref 70–99)
Glucose-Capillary: 57 mg/dL — ABNORMAL LOW (ref 70–99)
Glucose-Capillary: 94 mg/dL (ref 70–99)
Glucose-Capillary: 145 mg/dL — ABNORMAL HIGH (ref 70–99)
Glucose-Capillary: 181 mg/dL — ABNORMAL HIGH (ref 70–99)

## 2013-12-10 LAB — FERRITIN: Ferritin: 809 ng/mL — ABNORMAL HIGH (ref 10–291)

## 2013-12-10 MED ORDER — INSULIN GLARGINE 100 UNIT/ML ~~LOC~~ SOLN
55.0000 [IU] | Freq: Every day | SUBCUTANEOUS | Status: DC
  Administered 2013-12-10 – 2013-12-11 (×2): 55 [IU] via SUBCUTANEOUS
  Filled 2013-12-10 (×3): qty 0.55

## 2013-12-10 NOTE — Progress Notes (Signed)
Patient ID: Jasmine [name], female   DOB: 1970-05-07, 43 y.o.   MRN: [id number]   SUBJECTIVE: Breathing better, UOP improved, creatinine and BUN high but trending down.    Scheduled Meds: . amLODipine  5 mg Oral Daily  . antiseptic oral rinse  7 mL Mouth Rinse BID  . aspirin  81 mg Oral Daily  . atorvastatin  80 mg Oral q1800  . carvedilol  12.5 mg Oral BID WC  . ciprofloxacin  500 mg Oral Q1200  . clopidogrel  75 mg Oral Q breakfast  . darbepoetin (ARANESP) injection - NON-DIALYSIS  60 mcg Subcutaneous Q Fri-1800  . docusate sodium  100 mg Oral BID  . DULoxetine  60 mg Oral Daily  . enoxaparin (LOVENOX) injection  40 mg Subcutaneous Q24H  . furosemide  160 mg Intravenous 3 times per day  . insulin aspart  0-20 Units Subcutaneous TID WC  . insulin aspart  0-5 Units Subcutaneous QHS  . insulin aspart  10 Units Subcutaneous TID WC  . insulin glargine  60 Units Subcutaneous QHS  . l-methylfolate-B6-B12  1 tablet Oral BID  . metoCLOPramide  10 mg Oral TID AC  . potassium chloride  40 mEq Oral BID  . sodium chloride  3 mL Intravenous Q12H   Continuous Infusions: . sodium chloride Stopped (12/03/13 1935)  . dextrose 5 % and 0.45% NaCl 10 mL/hr at 12/03/13 1935   PRN Meds:.sodium chloride, acetaminophen, albuterol, HYDROcodone-acetaminophen, nitroGLYCERIN, ondansetron (ZOFRAN) IV, polyethylene glycol, sodium chloride, traMADol    Filed Vitals:   12/09/13 0900 12/09/13 1653 12/09/13 2104 12/10/13 0502  BP:  162/79 150/72 152/69  Pulse:  76 75 77  Temp:  97.9 F (36.6 C) 98 F (36.7 C) 97.7 F (36.5 C)  TempSrc:  Oral Oral Oral  Resp:  18 17 16   Height:      Weight: 280 lb 14.4 oz (127.415 kg)   280 lb 6.4 oz (127.189 kg)  SpO2:  98% 97% 99%    Intake/Output Summary (Last 24 hours) at 12/10/13 1034 Last data filed at 12/10/13 0850  Gross per 24 hour  Intake   2062 ml  Output   4350 ml  Net  -2288 ml    LABS: Basic Metabolic Panel:  Recent Labs  40/98/1111/15/15 0326  12/10/13 0335  NA 137 140  K 3.5* 3.7  CL 96 98  CO2 20 25  GLUCOSE 66* 162*  BUN 120* 113*  CREATININE 3.67* 3.43*  CALCIUM 9.3 9.1  PHOS 5.4* 5.5*   Liver Function Tests:  Recent Labs  12/09/13 0326 12/10/13 0335  ALBUMIN 3.0* 3.0*   No results for input(s): LIPASE, AMYLASE in the last 72 hours. CBC:  Recent Labs  12/09/13 0326  WBC 7.0  HGB 8.5*  HCT 26.4*  MCV 89.2  PLT 373   Cardiac Enzymes: No results for input(s): CKTOTAL, CKMB, CKMBINDEX, TROPONINI in the last 72 hours. BNP: Invalid input(s): POCBNP D-Dimer: No results for input(s): DDIMER in the last 72 hours. Hemoglobin A1C: No results for input(s): HGBA1C in the last 72 hours. Fasting Lipid Panel: No results for input(s): CHOL, HDL, LDLCALC, TRIG, CHOLHDL, LDLDIRECT in the last 72 hours. Thyroid Function Tests: No results for input(s): TSH, T4TOTAL, T3FREE, THYROIDAB in the last 72 hours.  Invalid input(s): FREET3 Anemia Panel:  Recent Labs  12/10/13 0335  FERRITIN 809*    RADIOLOGY: Dg Chest 2 View  12/03/2013   CLINICAL DATA:  Shortness of breath.  EXAM: CHEST  2  VIEW  COMPARISON:  Single view of the chest 03/24/2013, 04/01/2013 and 03/24/2013.  FINDINGS: Bilateral airspace disease has an appearance most consistent with pulmonary edema. Heart size is upper normal. No pleural effusion or pneumothorax.  IMPRESSION: Bilateral airspace disease has an appearance most consistent with pulmonary edema.   Electronically Signed   By: Drusilla Kannerhomas  Dalessio M.D.   On: 12/03/2013 03:56   [place] Renal  12/07/2013   CLINICAL DATA:  Acute renal insufficiency. Morbid obesity with chronic kidney disease and diabetes mellitus.  EXAM: RENAL/URINARY TRACT ULTRASOUND COMPLETE  COMPARISON:  CT abdomen pelvis 03/25/2013  FINDINGS: Right Kidney:  Length: 11.6 cm in sagittal length. There is mild diffuse thinning of the renal cortex. The echogenicity of the cortex appears slightly increased. Negative for hydronephrosis. No  evidence of mass.  Left Kidney:  Length: 12.5 cm in sagittal length. Cortical thickness appears within normal limits. Echogenicity of the cortex appears borderline increased. Negative for hydronephrosis. No evidence of mass.  Bladder:  Appears normal for degree of bladder distention.  IMPRESSION: Negative for hydronephrosis.  Mild thinning of the right renal cortex and borderline to mild increased echogenicity of the renal cortices bilaterally, a finding which can be seen with chronic medical renal disease.   Electronically Signed   By: Britta MccreedySusan  Turner M.D.   On: 12/07/2013 14:54    PHYSICAL EXAM General: NAD Neck: Thick, JVP 8-9 cm, no thyromegaly or thyroid nodule.  Lungs: Crackles bilateral bases. CV: Nondisplaced PMI.  Heart regular S1/S2, no S3/S4, 2/6 early SEM RUSB.  Trace ankle edema bilaterally.   Abdomen: Soft, nontender, no hepatosplenomegaly, no distention.  Neurologic: Alert and oriented x 3.  Psych: Normal affect. Extremities: No clubbing or cyanosis.   TELEMETRY: Reviewed telemetry pt in NSR  ASSESSMENT AND PLAN: 43 yo with history of HTN, CKD stage IV, type II diabetes, and diastolic CHF presented with gradual weight gain and gradual onset of severe exertional dyspnea => dyspnea at rest.  She was noted to be volume overloaded and started on IV Lasix.  Pulmonary edema on CXR.  1. Acute on chronic diastolic CHF: Last echo in 5/15 with EF 55-60% and moderate LVH.  Suspect she has had a gradual fluid accumulation over time based on symptoms and weight rise.  Our ability to diurese her is limited by cardiorenal syndrome. Renal service now following, high dose diuretics started with improvement in diuresis and weight coming down.  Volume status improving.  Think one more day IV diuretics then transition to po.   2. AKI on CKD: BUN/creatinine high but trending down.   3. HTN: Stable.  4. Nonobstructive CAD: Troponin not elevated, continue statin.  5. OSA: Using CPAP at night.  6.  Ambulate/out of bed.   Marca AnconaDalton Bradford Cazier 12/10/2013 10:34 AM

## 2013-12-10 NOTE — Progress Notes (Signed)
Inpatient Diabetes Program Recommendations  AACE/ADA: New Consensus Statement on Inpatient Glycemic Control (2013)  Target Ranges:  Prepandial:   less than 140 mg/dL      Peak postprandial:   less than 180 mg/dL (1-2 hours)      Critically ill patients:  140 - 180 mg/dL  Results for Jasmine [name], Jasmine [name] (MRN [id number]) as of 12/10/2013 11:53  Ref. Range 12/09/2013 06:36 12/09/2013 07:15 12/09/2013 11:30 12/09/2013 16:39 12/09/2013 21:10 12/10/2013 05:59 12/10/2013 10:49 12/10/2013 11:38  Glucose-Capillary Latest Range: 70-99 mg/dL 62 ([name]) 76 782157 (H) 956182 (H) 201 (H) 129 (H) 57 ([name]) 53 ([name])    Inpatient Diabetes Program Recommendations Insulin - Basal: reduce Lantus to 55 units  May also need a meal coverage reduction.   Thank you  Piedad ClimesGina Arshdeep Bolger BSN, RN,CDE Inpatient Diabetes Coordinator [phone] (team pager)

## 2013-12-10 NOTE — Progress Notes (Signed)
Patient has stated she can place herself on and off CPAP as needed. Patient is aware to call if she needs any help at all with the machine. RT will continue to assist as needed. 

## 2013-12-10 NOTE — Progress Notes (Signed)
Patient ID: Jasmine [name], female   DOB: 15-Jun-1970, 43 y.o.   MRN: [id number]   [facility] Progress Note    Assessment/ Plan:   1. AKI on CKD4: Baseline creatinine of about 1.9 (labs done in 08/2013). It then went up to the high 2's in September which is where it was on admit. Continues to have an excellent response to diuretics with slow improvement of renal function. Anticipated change to oral furosemide tomorrow to facilitate discharge thereafter if she is still stable. In spite of her elevated BUN, she does not have uremic symptoms. She is on scheduled potassium replacement with daily potassium levels to avoid overcorrection. 2. Acute on chronic CHF exacerbation: responding well to diuretic therapy with significant improvement of her shortness of breath. 3. Anemia of chronic disease/CKD: ongoing Aranesp therapy,iron stores are marginal and she would likely benefit from intravenous iron therapy. 4. Diabetes mellitus: on lantus 60u qhs and novolog 10u TID AC with sliding scale qHS. 5. HTN: on amlodipine 10mg  and carvedilol 12.5mg . Have taken amlodipine down to 5 as I feel when volume status improves BP will go down 6. Constipation- gave dulcolax and sorbitol 7. Escherichia coli UTI:on ciprofloxacin based on sensitivities  Subjective:   Reports to be feeling well-significant improvement of shortness of breath   Objective:   BP 152/69 mmHg  Pulse 77  Temp(Src) 97.7 F (36.5 C) (Oral)  Resp 16  Ht 5\' 7"  (1.702 m)  Wt 127.189 kg (280 lb 6.4 oz)  BMI 43.91 kg/m2  SpO2 99%  Intake/Output Summary (Last 24 hours) at 12/10/13 0945 Last data filed at 12/10/13 0850  Gross per 24 hour  Intake   2062 ml  Output   4350 ml  Net  -2288 ml   Weight change: 1.416 kg (3 lb 1.9 oz)  Physical Exam: WUJ:WJXBJYNWGNFGen:comfortably sitting up in her recliner AOZ:HYQMVCVS:pulse regular in rate and rhythm, S1 and S2 normal Resp:diminished breath sounds over bases otherwise clear to auscultation HQI:ONGEAbd:soft,  obese, nontender Ext:2+ lower extremity edema  Imaging: No results found.  Labs: BMET  Recent Labs Lab 12/04/13 0408 12/05/13 0404 12/06/13 0403 12/07/13 0355 12/08/13 0511 12/09/13 0326 12/10/13 0335  NA 137 136* 135* 139 139 137 140  K 3.1* 3.6* 4.0 4.1 3.9 3.5* 3.7  CL 96 95* 95* 99 98 96 98  CO2 21 18* 20 19 20 20 25   GLUCOSE 134* 124* 121* 119* 73 66* 162*  BUN 99* 106* 114* 122* 123* 120* 113*  CREATININE 3.15* 3.55* 4.26* 4.53* 4.00* 3.67* 3.43*  CALCIUM 9.1 9.4 9.3 8.8 9.1 9.3 9.1  PHOS  --   --   --   --  5.9* 5.4* 5.5*   CBC  Recent Labs Lab 12/09/13 0326  WBC 7.0  HGB 8.5*  HCT 26.4*  MCV 89.2  PLT 373    Medications:    . amLODipine  5 mg Oral Daily  . antiseptic oral rinse  7 mL Mouth Rinse BID  . aspirin  81 mg Oral Daily  . atorvastatin  80 mg Oral q1800  . carvedilol  12.5 mg Oral BID WC  . ciprofloxacin  500 mg Oral Q1200  . clopidogrel  75 mg Oral Q breakfast  . darbepoetin (ARANESP) injection - NON-DIALYSIS  60 mcg Subcutaneous Q Fri-1800  . docusate sodium  100 mg Oral BID  . DULoxetine  60 mg Oral Daily  . enoxaparin (LOVENOX) injection  40 mg Subcutaneous Q24H  . furosemide  160 mg Intravenous 3  times per day  . insulin aspart  0-20 Units Subcutaneous TID WC  . insulin aspart  0-5 Units Subcutaneous QHS  . insulin aspart  10 Units Subcutaneous TID WC  . insulin glargine  60 Units Subcutaneous QHS  . l-methylfolate-B6-B12  1 tablet Oral BID  . metoCLOPramide  10 mg Oral TID AC  . potassium chloride  40 mEq Oral BID  . sodium chloride  3 mL Intravenous Q12H   Zetta BillsJay Brannan Cassedy, MD 12/10/2013, 9:45 AM

## 2013-12-10 NOTE — Progress Notes (Signed)
UR completed Mekaylah Klich K. Tyann Niehaus, RN, BSN, MSHL, CCM  12/10/2013 4:00 PM

## 2013-12-11 LAB — GLUCOSE, CAPILLARY
GLUCOSE-CAPILLARY: 172 mg/dL — AB (ref 70–99)
Glucose-Capillary: 79 mg/dL (ref 70–99)
Glucose-Capillary: 170 mg/dL — ABNORMAL HIGH (ref 70–99)
Glucose-Capillary: 258 mg/dL — ABNORMAL HIGH (ref 70–99)

## 2013-12-11 LAB — BASIC METABOLIC PANEL
ANION GAP: 17 — AB (ref 5–15)
BUN: 97 mg/dL — ABNORMAL HIGH (ref 6–23)
CALCIUM: 9.4 mg/dL (ref 8.4–10.5)
CO2: 26 mEq/L (ref 19–32)
Chloride: 94 mEq/L — ABNORMAL LOW (ref 96–112)
Creatinine, Ser: 3.01 mg/dL — ABNORMAL HIGH (ref 0.50–1.10)
GFR calc non Af Amer: 18 mL/min — ABNORMAL LOW (ref >90)
GFR, EST AFRICAN AMERICAN: 21 mL/min — AB (ref >90)
Glucose, Bld: 178 mg/dL — ABNORMAL HIGH (ref 70–99)
POTASSIUM: 4.1 meq/L (ref 3.7–5.3)
Sodium: 137 mEq/L (ref 137–147)

## 2013-12-11 MED ORDER — TORSEMIDE 20 MG PO TABS
80.0000 mg | ORAL_TABLET | Freq: Two times a day (BID) | ORAL | Status: DC
  Administered 2013-12-11 – 2013-12-12 (×3): 80 mg via ORAL
  Filled 2013-12-11 (×5): qty 4

## 2013-12-11 NOTE — Progress Notes (Signed)
Nutrition Education Note  RD consulted for nutrition education.   RD provided "Low Sodium Nutrition Therapy" handout from the Academy of [employer]. Reviewed patient's dietary recall. Provided examples on ways to decrease sodium intake in diet. Discouraged intake of processed foods and use of salt shaker. Encouraged fresh fruits and vegetables as well as whole grain sources of carbohydrates to maximize fiber intake.  Patient reports eating out a lot. RD provided examples of easy to prepare low sodium meals and discussed tips for eating out.   RD discussed why it is important for patient to adhere to diet recommendations, and emphasized the role of fluids, foods to avoid, and importance of weighing self daily. Teach back method used.  Expect good compliance.  Body mass index is 43.32 kg/(m^2). Pt meets criteria for Morbid Obesity based on current BMI.  Current diet order is Carb Modified, patient is consuming approximately 100% of meals at this time. Labs and medications reviewed. No further nutrition interventions warranted at this time. RD contact information provided. If additional nutrition issues arise, please re-consult RD.   Ian Malkineanne Barnett RD, LDN Inpatient Clinical Dietitian Pager: [phone] After Hours Pager: [phone]

## 2013-12-11 NOTE — Progress Notes (Signed)
Inpatient Diabetes Program Recommendations  AACE/ADA: New Consensus Statement on Inpatient Glycemic Control (2013)  Target Ranges:  Prepandial:   less than 140 mg/dL      Peak postprandial:   less than 180 mg/dL (1-2 hours)      Critically ill patients:  140 - 180 mg/dL  Results for Jasmine [name], Jasmine [name] (MRN [id number]) as of 12/11/2013 14:15  Ref. Range 12/10/2013 12:20 12/10/2013 17:20 12/10/2013 21:14 12/11/2013 06:41 12/11/2013 11:17  Glucose-Capillary Latest Range: 70-99 mg/dL 94 409145 (H) 811181 (H) 79 914172 (H)   Consider reducing Novolog meal coverage to 5 units TID.   Thank you  Piedad ClimesGina Jeremian Whitby BSN, RN,CDE Inpatient Diabetes Coordinator [phone] (team pager)

## 2013-12-11 NOTE — Progress Notes (Signed)
Physical Therapy Discharge Patient Details Name: Jasmine [name] MRN: [id number] DOB: Siani 17, 1972 Today's Date: 12/11/2013 Time: 8588-5027 PT Time Calculation (min) (ACUTE ONLY): 23 min  Patient discharged from PT services secondary to goals met and no further PT needs identified.  Please see latest therapy progress note for current level of functioning and progress toward goals.    Progress and discharge plan discussed with patient and/or caregiver: Patient/Caregiver agrees with plan  GP     Rolla Etienne, PT DPT  [phone]  12/11/2013, 1:54 PM

## 2013-12-11 NOTE — Discharge Instructions (Signed)

## 2013-12-11 NOTE — Progress Notes (Signed)
Pt with AM IVPB lasix due at 6. Pt states MD is going to switch her to PO lasix today and requests to wait to take lasix until MD rounds this AM.

## 2013-12-11 NOTE — Progress Notes (Signed)
Patient ID: Jasmine [name], female   DOB: [birth year], 43 y.o.   MRN: [id number]   SUBJECTIVE:   Denies SOB. Weight down another 4 pounds on high dose IV lasix.  Scheduled Meds: . amLODipine  5 mg Oral Daily  . antiseptic oral rinse  7 mL Mouth Rinse BID  . aspirin  81 mg Oral Daily  . atorvastatin  80 mg Oral q1800  . carvedilol  12.5 mg Oral BID WC  . ciprofloxacin  500 mg Oral Q1200  . clopidogrel  75 mg Oral Q breakfast  . darbepoetin (ARANESP) injection - NON-DIALYSIS  60 mcg Subcutaneous Q Fri-1800  . docusate sodium  100 mg Oral BID  . DULoxetine  60 mg Oral Daily  . enoxaparin (LOVENOX) injection  40 mg Subcutaneous Q24H  . furosemide  160 mg Intravenous 3 times per day  . insulin aspart  0-20 Units Subcutaneous TID WC  . insulin aspart  0-5 Units Subcutaneous QHS  . insulin aspart  10 Units Subcutaneous TID WC  . insulin glargine  55 Units Subcutaneous QHS  . l-methylfolate-B6-B12  1 tablet Oral BID  . metoCLOPramide  10 mg Oral TID AC  . potassium chloride  40 mEq Oral BID  . sodium chloride  3 mL Intravenous Q12H   Continuous Infusions: . sodium chloride Stopped (12/03/13 1935)  . dextrose 5 % and 0.45% NaCl 10 mL/hr at 12/03/13 1935   PRN Meds:.sodium chloride, acetaminophen, albuterol, HYDROcodone-acetaminophen, nitroGLYCERIN, ondansetron (ZOFRAN) IV, polyethylene glycol, sodium chloride, traMADol    Filed Vitals:   12/10/13 1516 12/10/13 2106 12/11/13 0631 12/11/13 0800  BP: 156/71 148/66 145/90 154/73  Pulse: 77 73 73 75  Temp: 98 F (36.7 C) 98 F (36.7 C) 98.2 F (36.8 C)   TempSrc: Oral  Oral   Resp: 18 16 17    Height:      Weight:   276 lb 10.8 oz (125.5 kg)   SpO2: 100% 99% 100%     Intake/Output Summary (Last 24 hours) at 12/11/13 0900 Last data filed at 12/11/13 0646  Gross per 24 hour  Intake    612 ml  Output   2900 ml  Net  -2288 ml    LABS: Basic Metabolic Panel:  Recent Labs  78/29/5611/15/15 0326 12/10/13 0335  NA 137 140  K 3.5* 3.7   CL 96 98  CO2 20 25  GLUCOSE 66* 162*  BUN 120* 113*  CREATININE 3.67* 3.43*  CALCIUM 9.3 9.1  PHOS 5.4* 5.5*   Liver Function Tests:  Recent Labs  12/09/13 0326 12/10/13 0335  ALBUMIN 3.0* 3.0*   No results for input(s): LIPASE, AMYLASE in the last 72 hours. CBC:  Recent Labs  12/09/13 0326  WBC 7.0  HGB 8.5*  HCT 26.4*  MCV 89.2  PLT 373   Cardiac Enzymes: No results for input(s): CKTOTAL, CKMB, CKMBINDEX, TROPONINI in the last 72 hours. BNP: Invalid input(s): POCBNP D-Dimer: No results for input(s): DDIMER in the last 72 hours. Hemoglobin A1C: No results for input(s): HGBA1C in the last 72 hours. Fasting Lipid Panel: No results for input(s): CHOL, HDL, LDLCALC, TRIG, CHOLHDL, LDLDIRECT in the last 72 hours. Thyroid Function Tests: No results for input(s): TSH, T4TOTAL, T3FREE, THYROIDAB in the last 72 hours.  Invalid input(s): FREET3 Anemia Panel:  Recent Labs  12/10/13 0335  FERRITIN 809*  TIBC 232*  IRON 43    RADIOLOGY: Dg Chest 2 View  12/03/2013   CLINICAL DATA:  Shortness of breath.  EXAM:  CHEST  2 VIEW  COMPARISON:  Single view of the chest 03/24/2013, 04/01/2013 and 03/24/2013.  FINDINGS: Bilateral airspace disease has an appearance most consistent with pulmonary edema. Heart size is upper normal. No pleural effusion or pneumothorax.  IMPRESSION: Bilateral airspace disease has an appearance most consistent with pulmonary edema.   Electronically Signed   By: Drusilla Kannerhomas  Dalessio M.D.   On: 12/03/2013 03:56   [facility]  12/07/2013   CLINICAL DATA:  Acute renal insufficiency. Morbid obesity with chronic kidney disease and diabetes mellitus.  EXAM: RENAL/URINARY TRACT ULTRASOUND COMPLETE  COMPARISON:  CT abdomen pelvis 03/25/2013  FINDINGS: Right Kidney:  Length: 11.6 cm in sagittal length. There is mild diffuse thinning of the renal cortex. The echogenicity of the cortex appears slightly increased. Negative for hydronephrosis. No evidence of mass.  Left  Kidney:  Length: 12.5 cm in sagittal length. Cortical thickness appears within normal limits. Echogenicity of the cortex appears borderline increased. Negative for hydronephrosis. No evidence of mass.  Bladder:  Appears normal for degree of bladder distention.  IMPRESSION: Negative for hydronephrosis.  Mild thinning of the right renal cortex and borderline to mild increased echogenicity of the renal cortices bilaterally, a finding which can be seen with chronic medical renal disease.   Electronically Signed   By: Britta MccreedySusan  Turner M.D.   On: 12/07/2013 14:54    PHYSICAL EXAM General: NAD. Sitting chair.  Neck: Thick, JVP 6-7 cm, no thyromegaly or thyroid nodule.  Lungs: decreased throughout.  CV: Nondisplaced PMI.  Heart regular S1/S2, no S3/S4, 2/6 early SEM RUSB.  Trace ankle edema bilaterally.   Abdomen: Soft, nontender, no hepatosplenomegaly, no distention.  Neurologic: Alert and oriented x 3.  Psych: Normal affect. Extremities: No clubbing or cyanosis.   TELEMETRY: Reviewed telemetry pt in NSR  ASSESSMENT AND PLAN: 43 yo with history of HTN, CKD stage IV, type II diabetes, and diastolic CHF presented with gradual weight gain and gradual onset of severe exertional dyspnea => dyspnea at rest.  She was noted to be volume overloaded and started on IV Lasix.  Pulmonary edema on CXR.  1. Acute on chronic diastolic CHF: Last echo in 5/15 with EF 55-60% and moderate LVH.  Suspect she has had a gradual fluid accumulation over time based on symptoms and weight rise.  Our ability to diurese her is limited by cardiorenal syndrome. Renal service following.  Volume status improved. Stop IV lasix and switch to torsemide 80 mg twice a day. Consult cardiac rehab.  2. AKI on CKD: Check BMET now.  Will need follow up with Dr Arrie Aranoladonato  3. HTN: Stable.  4. Nonobstructive CAD: Troponin not elevated, continue statin.  5. OSA: Using CPAP at night.  6. Ambulate/out of bed.   Anticipate d/c in am CLEGG,AMY NP-C    12/11/2013 9:00 AM  Patient seen with NP, agree with the above note.  She is doing better, diuresed well yesterday, weight down another 4 lbs.  Will transition to torsemide 80 mg bid and get BMET today.  Plan home tomorrow if remains stable.   Marca AnconaDalton Brayam Boeke 12/11/2013 9:26 AM

## 2013-12-11 NOTE — Discharge Summary (Signed)
Advanced Heart Failure Team  Discharge Summary   Patient ID: Jasmine [name] MRN: [id number], DOB/AGE: July 22, 1970 43 y.o. Admit date: 12/03/2013 D/C date:     12/12/2013   PCP: Dr. Christy Gentlesicter Nephrology: Dr Abel Prestoolodonato Pulmonary: Dr. Vassie LollAlva  Primary Discharge Diagnoses:  1. Acute on chronic diastolic CHF: Last echo in 5/15 with EF 55-60% and moderate LVH.  2. AKI on CKD.  3. HTN 4. Nonobstructive CAD 5. OSA  Hospital Course:  Jasmine [name] is a 43 y.o [place] female with history of morbid obesity, nonobstructive CAD per cath 2010, HTN, OSA, uncontrolled DM type 1, class III CKD and diastolic HF.   Presented to the ED on 12/03/13 with severe SOB and was found to be hypoxemic. Pertinent labs on admission were pro-BNP 2451, creatinine 2.52, BUN 88, Glucose 415, Hgb 8.7 and troponin 0.04. She was placed on O2 for low O2 saturations. Started on IV diuretics with minimal UOP origininally and rise in BUN and creatinine and renal was consulted. Her diuretics were placed on hold for a couple days with rise in renal function. Renal recommended restarting IV lasix at 160 mg TID and volume status began to slowly improve. Once her volume status was stable she was transitioned to PO torsemide 80 mg BID. Her renal function plateaued to creatinine around 2.8-3.0 and BUN 90. Her O2 was weaned off and she was ambulating in the halls with Cardiac rehab with no issues.   On day of discharge she denied any SOB, orthopnea or CP and VSS. Her Creatinine was 2.83 and K+ 4.3. She will be followed closely in the HF clinic next week and will follow up with renal.   Discharge Weight Range: Discharge Weight 275 pounds.   Discharge Vitals: Blood pressure 166/77, pulse 79, temperature 97.7 F (36.5 C), temperature source Oral, resp. rate 16, height 5\' 7"  (1.702 m), weight 275 lb 5.3 oz (124.89 kg), SpO2 100 %.  Labs: Lab Results  Component Value Date   WBC 7.0 12/09/2013   HGB 8.5* 12/09/2013   HCT 26.4* 12/09/2013    MCV 89.2 12/09/2013   PLT 373 12/09/2013     Recent Labs Lab 12/12/13 0437  NA 137  K 4.3  CL 98  CO2 22  BUN 90*  CREATININE 2.83*  CALCIUM 9.4  GLUCOSE 108*   Lab Results  Component Value Date   CHOL 314* 11/03/2010   HDL 35* 11/03/2010   LDLCALC 247* 11/03/2010   TRIG 159* 11/03/2010   BNP (last 3 results)  Recent Labs  03/24/13 1200 05/25/13 0545 12/03/13 0305  PROBNP 6479.0* 3258.0* 2451.0*    Diagnostic Studies/Procedures   No results found.  Discharge Medications     Medication List    STOP taking these medications        metoprolol tartrate 25 MG tablet  Commonly known as:  LOPRESSOR      TAKE these medications        acetaminophen 500 MG tablet  Commonly known as:  TYLENOL  Take 500 mg by mouth every 6 (six) hours as needed for mild pain.     albuterol 108 (90 BASE) MCG/ACT inhaler  Commonly known as:  PROVENTIL HFA;VENTOLIN HFA  Inhale 2 puffs into the lungs every 6 (six) hours as needed for wheezing.     amLODipine 10 MG tablet  Commonly known as:  NORVASC  Take 10 mg by mouth daily.     aspirin 81 MG tablet  Take 81 mg by mouth daily. For  pain     atorvastatin 80 MG tablet  Commonly known as:  LIPITOR  Take 1 tablet (80 mg total) by mouth daily at 6 PM.     capsicum 0.075 % topical cream  Commonly known as:  ZOSTRIX  Apply 1 application topically 3 (three) times daily. Apply to feet up to 3x daily     carvedilol 6.25 MG tablet  Commonly known as:  COREG  Take 2 tablets (12.5 mg total) by mouth 2 (two) times daily with a meal.     clopidogrel 75 MG tablet  Commonly known as:  PLAVIX  Take 1 tablet (75 mg total) by mouth daily with breakfast.     DULoxetine 60 MG capsule  Commonly known as:  CYMBALTA  Take 1 capsule (60 mg total) by mouth daily.     ferrous sulfate 325 (65 FE) MG tablet  Take 325 mg by mouth 2 (two) times daily.     HYDROcodone-acetaminophen 5-325 MG per tablet  Commonly known as:  NORCO  Take 1  tablet by mouth every 6 (six) hours as needed for moderate pain.     insulin glargine 100 UNIT/ML injection  Commonly known as:  LANTUS  Inject 50 Units into the skin at bedtime.     insulin lispro 100 UNIT/ML injection  Commonly known as:  HUMALOG  Inject 12 Units into the skin 3 (three) times daily before meals.     l-methylfolate-B6-B12 3-35-2 MG Tabs  Commonly known as:  METANX  Take 1 tablet by mouth 2 (two) times daily.     metoCLOPramide 10 MG tablet  Commonly known as:  REGLAN  Take 10 mg by mouth 3 (three) times daily before meals. One po qac     metolazone 2.5 MG tablet  Commonly known as:  ZAROXOLYN  TAKE 1 TABLET (2.5 MG TOTAL) BY MOUTH AS DIRECTED.     potassium chloride SA 20 MEQ tablet  Commonly known as:  K-DUR,KLOR-CON  Take 2 tablets (40 mEq total) by mouth 2 (two) times daily.     prenatal multivitamin Tabs tablet  Take 1 tablet by mouth daily at 12 noon.     torsemide 20 MG tablet  Commonly known as:  DEMADEX  Take 4 tablets (80 mg total) by mouth 2 (two) times daily.        Disposition   The patient will be discharged in stable condition to home. Discharge Instructions    Contraindication to ACEI at discharge    Complete by:  As directed      Diet - low sodium heart healthy    Complete by:  As directed      Heart Failure patients record your daily weight using the same scale at the same time of day    Complete by:  As directed      Increase activity slowly    Complete by:  As directed           Follow-up Information    Follow up with CLEGG,AMY, NP On 12/18/2013.   Specialty:  Nurse Practitioner   Why:  at [address]   Contact information:   [address] [place] [address] [phone]         Duration of Discharge Encounter: Greater than 35 minutes   Signed, Aundria RudCosgrove, Jazzalyn Loewenstein B NP-C  12/12/2013, 8:39 AM

## 2013-12-11 NOTE — Progress Notes (Signed)
Physical Therapy Treatment Patient Details Name: Jasmine [name] MRN: [id number] DOB: [birth year] Today's Date: 12/11/2013    History of Present Illness 43 yo with history of HTN, CKD stage IV, type II diabetes, and diastolic CHF presented with gradual weight gain and gradual onset of severe exertional dyspnea => dyspnea at rest.  She was noted to be volume overloaded and started on IV Lasix.  Pulmonary edema on CXR.     PT Comments    Patient demonstrates independence with mobility, performed stair negotiation well with SpO2 >90% on room air throughout session.  Patient educated on energy conservation, mobility expectations, self management and recognition of O2 desaturation, and safety. Patient has no further acute PT needs at this time, encourage continued mobility during stay.  Acute PT will sign off. All goals met, patient in agreement.  Follow Up Recommendations  No PT follow up   Equipment Recommendations  None recommended by PT    Recommendations for Other Services       Precautions / Restrictions Precautions Precautions: Fall Precaution Comments: decreased O2 saturations Restrictions Weight Bearing Restrictions: No    Mobility  Bed Mobility               General bed mobility comments: received in chair  Transfers Overall transfer level: Independent Equipment used: None                Ambulation/Gait Ambulation/Gait assistance: Independent Ambulation Distance (Feet): 340 Feet Assistive device: None Gait Pattern/deviations: Step-through pattern;Drifts right/left;Wide base of support Gait velocity: decreased Gait velocity interpretation: Below normal speed for age/gender General Gait Details: Improvements in O2 saturations this session. ambulated on 3 liters supplemental O2, SpO2 remained >93% with spot checks and 3 standing rest breaks   Stairs Stairs: Yes Stairs assistance: Modified independent (Device/Increase time) Stair Management: Two  rails;Step to pattern;Forwards (one time performed sideways 6 steps) Number of Stairs: 12 (12x1 with 2 rails, 6x1 with 1 rail sideways) General stair comments: Patient tolerated well, SpO2 remained >90% on room air after stair negotiation, discussed techniques for energy conservation and stair management upon discharge given multiple flights to get into her apt  Wheelchair Mobility    Modified Rankin (Stroke Patients Only)       Balance                                    Cognition Arousal/Alertness: Awake/alert Behavior During Therapy: WFL for tasks assessed/performed Overall Cognitive Status: Within Functional Limits for tasks assessed                      Exercises      General Comments General comments (skin integrity, edema, etc.): spoke with patient at legnth regarding mobility upon discharge, discussed various methods for performing stair negotiation, reviewed symptoms to be on look out for EQ:[id number] O2, provided information on obtaining pulse oximeter for home use.       Pertinent Vitals/Pain      Home Living                      Prior Function            PT Goals (current goals can now be found in the care plan section) Acute Rehab PT Goals Patient Stated Goal: to go home PT Goal Formulation: With patient Time For Goal Achievement: 12/19/13 Potential to Achieve Goals: Good Progress towards PT  goals: Goals met/education completed, patient discharged from PT    Frequency  Min 3X/week    PT Plan Current plan remains appropriate    Co-evaluation             End of Session Equipment Utilized During Treatment: Gait belt Activity Tolerance: Patient limited by fatigue Patient left: in chair;with call bell/phone within reach     Time: 1325-1348 PT Time Calculation (min) (ACUTE ONLY): 23 min  Charges:  $Gait Training: 8-22 mins $Self Care/Home Management: 8-22                    G CodesDuncan Dull December 12, 2013, 1:52 PM Alben Deeds, [place] DPT  [phone]

## 2013-12-11 NOTE — Plan of Care (Signed)
Problem: Phase III Progression Outcomes Goal: Tolerating diet Outcome: Completed/Met Date Met:  12/11/13

## 2013-12-11 NOTE — Progress Notes (Signed)
Patient ID: Jasmine [name], female   DOB: 1970/06/22, 43 y.o.   MRN: [id number]  [facility] Progress Note   Assessment/ Plan:   1. AKI on CKD4: Baseline creatinine previously of about 1.9 (labs done in 08/2013) however, more recent baseline appears to be in the mid to upper 2 range. She continues to have an excellent response to intravenous furosemide, renal function slowly improving (labs are pending from this morning). Agree with switching to oral torsemide 80 mg twice a day (she used to take 80 mg twice a day prior to admission and currently is on furosemide 480 mg intravenous). She is on scheduled potassium replacement with daily potassium levels to avoid overcorrection. 2. Acute on chronic CHF exacerbation: responding well to diuretic therapy with significant improvement of her shortness of breath. 3. Anemia of chronic disease/CKD: ongoing Aranesp therapy,iron stores are marginal and I will order a dose of intravenous iron. 4. Diabetes mellitus: on lantus 60u qhs and novolog 10u TID AC with sliding scale qHS. 5. HTN: blood pressures fairly controlled on amlodipine/carvedilol and optimization of diuretic therapy 6. Constipation- gave dulcolax and sorbitol 7. Escherichia coli UTI:on ciprofloxacin based on sensitivities  Subjective:   Reports to be feeling fair with improved shortness of breath/leg swelling and anxious about switching to oral diuretic   Objective:   BP 154/73 mmHg  Pulse 75  Temp(Src) 98.2 F (36.8 C) (Oral)  Resp 17  Ht 5\' 7"  (1.702 m)  Wt 125.5 kg (276 lb 10.8 oz)  BMI 43.32 kg/m2  SpO2 100%  Intake/Output Summary (Last 24 hours) at 12/11/13 0926 Last data filed at 12/11/13 [id number]  Gross per 24 hour  Intake    612 ml  Output   2900 ml  Net  -2288 ml   Weight change: -1.915 kg (-4 lb 3.6 oz)  Physical Exam: JXB:JYNWGNFAOZHGen:comfortably sitting up in a recliner YQM:VHQIOCVS:pulse regular in rate and rhythm, S1 and S2 normal Resp:decreased breath sounds over the bases  otherwise clear NGE:XBMWAbd:soft, obese, nontender Ext:2-3+ lower extremity edema  Imaging: No results found.  Labs: BMET  Recent Labs Lab 12/05/13 0404 12/06/13 0403 12/07/13 0355 12/08/13 0511 12/09/13 0326 12/10/13 0335  NA 136* 135* 139 139 137 140  K 3.6* 4.0 4.1 3.9 3.5* 3.7  CL 95* 95* 99 98 96 98  CO2 18* 20 19 20 20 25   GLUCOSE 124* 121* 119* 73 66* 162*  BUN 106* 114* 122* 123* 120* 113*  CREATININE 3.55* 4.26* 4.53* 4.00* 3.67* 3.43*  CALCIUM 9.4 9.3 8.8 9.1 9.3 9.1  PHOS  --   --   --  5.9* 5.4* 5.5*   CBC  Recent Labs Lab 12/09/13 0326  WBC 7.0  HGB 8.5*  HCT 26.4*  MCV 89.2  PLT 373    Medications:    . amLODipine  5 mg Oral Daily  . antiseptic oral rinse  7 mL Mouth Rinse BID  . aspirin  81 mg Oral Daily  . atorvastatin  80 mg Oral q1800  . carvedilol  12.5 mg Oral BID WC  . ciprofloxacin  500 mg Oral Q1200  . clopidogrel  75 mg Oral Q breakfast  . darbepoetin (ARANESP) injection - NON-DIALYSIS  60 mcg Subcutaneous Q Fri-1800  . docusate sodium  100 mg Oral BID  . DULoxetine  60 mg Oral Daily  . enoxaparin (LOVENOX) injection  40 mg Subcutaneous Q24H  . furosemide  160 mg Intravenous 3 times per day  . insulin aspart  0-20 Units  Subcutaneous TID WC  . insulin aspart  0-5 Units Subcutaneous QHS  . insulin aspart  10 Units Subcutaneous TID WC  . insulin glargine  55 Units Subcutaneous QHS  . l-methylfolate-B6-B12  1 tablet Oral BID  . metoCLOPramide  10 mg Oral TID AC  . potassium chloride  40 mEq Oral BID  . sodium chloride  3 mL Intravenous Q12H   Zetta BillsJay Darnesha Diloreto, MD 12/11/2013, 9:26 AM

## 2013-12-12 ENCOUNTER — Telehealth (HOSPITAL_COMMUNITY): Payer: Self-pay | Admitting: Vascular Surgery

## 2013-12-12 LAB — BASIC METABOLIC PANEL
ANION GAP: 17 — AB (ref 5–15)
BUN: 90 mg/dL — ABNORMAL HIGH (ref 6–23)
CALCIUM: 9.4 mg/dL (ref 8.4–10.5)
CO2: 22 mEq/L (ref 19–32)
Chloride: 98 mEq/L (ref 96–112)
Creatinine, Ser: 2.83 mg/dL — ABNORMAL HIGH (ref 0.50–1.10)
GFR calc non Af Amer: 19 mL/min — ABNORMAL LOW (ref >90)
GFR, EST AFRICAN AMERICAN: 22 mL/min — AB (ref >90)
Glucose, Bld: 108 mg/dL — ABNORMAL HIGH (ref 70–99)
Potassium: 4.3 mEq/L (ref 3.7–5.3)
SODIUM: 137 meq/L (ref 137–147)

## 2013-12-12 LAB — GLUCOSE, CAPILLARY: Glucose-Capillary: 118 mg/dL — ABNORMAL HIGH (ref 70–99)

## 2013-12-12 MED ORDER — CARVEDILOL 6.25 MG PO TABS: 12.5000 mg | ORAL_TABLET | Freq: Two times a day (BID) | ORAL | Status: DC

## 2013-12-12 MED ORDER — POTASSIUM CHLORIDE CRYS ER 20 MEQ PO TBCR: 40.0000 meq | EXTENDED_RELEASE_TABLET | Freq: Two times a day (BID) | ORAL | Status: DC

## 2013-12-12 NOTE — Progress Notes (Signed)
Patient ID: Jasmine [name], female   DOB: [birth year], 43 y.o.   MRN: [id number]   SUBJECTIVE:   Yesterday she was transitioned from IV lasix to po torsemide.  Weight down another pound.   Denies SOB.    Scheduled Meds: . amLODipine  5 mg Oral Daily  . antiseptic oral rinse  7 mL Mouth Rinse BID  . aspirin  81 mg Oral Daily  . atorvastatin  80 mg Oral q1800  . carvedilol  12.5 mg Oral BID WC  . clopidogrel  75 mg Oral Q breakfast  . darbepoetin (ARANESP) injection - NON-DIALYSIS  60 mcg Subcutaneous Q Fri-1800  . docusate sodium  100 mg Oral BID  . DULoxetine  60 mg Oral Daily  . enoxaparin (LOVENOX) injection  40 mg Subcutaneous Q24H  . insulin aspart  0-20 Units Subcutaneous TID WC  . insulin aspart  0-5 Units Subcutaneous QHS  . insulin aspart  10 Units Subcutaneous TID WC  . insulin glargine  55 Units Subcutaneous QHS  . l-methylfolate-B6-B12  1 tablet Oral BID  . metoCLOPramide  10 mg Oral TID AC  . potassium chloride  40 mEq Oral BID  . sodium chloride  3 mL Intravenous Q12H  . torsemide  80 mg Oral BID   Continuous Infusions: . sodium chloride Stopped (12/03/13 1935)  . dextrose 5 % and 0.45% NaCl 10 mL/hr at 12/03/13 1935   PRN Meds:.sodium chloride, acetaminophen, albuterol, HYDROcodone-acetaminophen, nitroGLYCERIN, ondansetron (ZOFRAN) IV, polyethylene glycol, sodium chloride, traMADol    Filed Vitals:   12/11/13 1041 12/11/13 1538 12/11/13 2036 12/12/13 0517  BP: 169/72 165/74 155/70 166/77  Pulse:  81 76 79  Temp:  98.1 F (36.7 C) 98.7 F (37.1 C) 97.7 F (36.5 C)  TempSrc:  Oral Oral Oral  Resp:   16 16  Height:      Weight:    275 lb 5.3 oz (124.89 kg)  SpO2:  100% 98% 100%    Intake/Output Summary (Last 24 hours) at 12/12/13 0657 Last data filed at 12/12/13 0526  Gross per 24 hour  Intake   1200 ml  Output   2450 ml  Net  -1250 ml    LABS: Basic Metabolic Panel:  Recent Labs  78/29/5611/16/15 0335 12/11/13 1010  NA 140 137  K 3.7 4.1  CL 98  94*  CO2 25 26  GLUCOSE 162* 178*  BUN 113* 97*  CREATININE 3.43* 3.01*  CALCIUM 9.1 9.4  PHOS 5.5*  --    Liver Function Tests:  Recent Labs  12/10/13 0335  ALBUMIN 3.0*   No results for input(s): LIPASE, AMYLASE in the last 72 hours. CBC: No results for input(s): WBC, NEUTROABS, HGB, HCT, MCV, PLT in the last 72 hours. Cardiac Enzymes: No results for input(s): CKTOTAL, CKMB, CKMBINDEX, TROPONINI in the last 72 hours. BNP: Invalid input(s): POCBNP D-Dimer: No results for input(s): DDIMER in the last 72 hours. Hemoglobin A1C: No results for input(s): HGBA1C in the last 72 hours. Fasting Lipid Panel: No results for input(s): CHOL, HDL, LDLCALC, TRIG, CHOLHDL, LDLDIRECT in the last 72 hours. Thyroid Function Tests: No results for input(s): TSH, T4TOTAL, T3FREE, THYROIDAB in the last 72 hours.  Invalid input(s): FREET3 Anemia Panel:  Recent Labs  12/10/13 0335  FERRITIN 809*  TIBC 232*  IRON 43    RADIOLOGY: Dg Chest 2 View  12/03/2013   CLINICAL DATA:  Shortness of breath.  EXAM: CHEST  2 VIEW  COMPARISON:  Single view of the chest  03/24/2013, 04/01/2013 and 03/24/2013.  FINDINGS: Bilateral airspace disease has an appearance most consistent with pulmonary edema. Heart size is upper normal. No pleural effusion or pneumothorax.  IMPRESSION: Bilateral airspace disease has an appearance most consistent with pulmonary edema.   Electronically Signed   By: Drusilla Kannerhomas  Dalessio M.D.   On: 12/03/2013 03:56   [place] [facility]  12/07/2013   CLINICAL DATA:  Acute renal insufficiency. Morbid obesity with chronic kidney disease and diabetes mellitus.  EXAM: RENAL/URINARY TRACT ULTRASOUND COMPLETE  COMPARISON:  CT abdomen pelvis 03/25/2013  FINDINGS: Right Kidney:  Length: 11.6 cm in sagittal length. There is mild diffuse thinning of the renal cortex. The echogenicity of the cortex appears slightly increased. Negative for hydronephrosis. No evidence of mass.  Left Kidney:  Length: 12.5 cm in  sagittal length. Cortical thickness appears within normal limits. Echogenicity of the cortex appears borderline increased. Negative for hydronephrosis. No evidence of mass.  Bladder:  Appears normal for degree of bladder distention.  IMPRESSION: Negative for hydronephrosis.  Mild thinning of the right renal cortex and borderline to mild increased echogenicity of the renal cortices bilaterally, a finding which can be seen with chronic medical renal disease.   Electronically Signed   By: Britta MccreedySusan  Turner M.D.   On: 12/07/2013 14:54    PHYSICAL EXAM General: NAD. Sitting chair.  Neck: Thick, JVP 6-7 cm, no thyromegaly or thyroid nodule.  Lungs: decreased throughout.  CV: Nondisplaced PMI.  Heart regular S1/S2, no S3/S4, 2/6 early SEM RUSB.  Trace ankle edema bilaterally.   Abdomen: Soft, nontender, no hepatosplenomegaly, no distention.  Neurologic: Alert and oriented x 3.  Psych: Normal affect. Extremities: No clubbing or cyanosis.   TELEMETRY: Reviewed telemetry pt in NSR  ASSESSMENT AND PLAN: 43 yo with history of HTN, CKD stage IV, type II diabetes, and diastolic CHF presented with gradual weight gain and gradual onset of severe exertional dyspnea => dyspnea at rest.  She was noted to be volume overloaded and started on IV Lasix.  Pulmonary edema on CXR.  1. Acute on chronic diastolic CHF: Last echo in 5/15 with EF 55-60% and moderate LVH.  Appears euvolemic. Overall she has diuresed 14 pounds. Continue torsemide 80 mg twice a day. BMET pending. OK to d/c today if renal function ok.    2. AKI on CKD: BMET pending. Will need follow up with Dr Arrie Aranoladonato  3. HTN: Stable.  4. Nonobstructive CAD: Troponin not elevated, continue statin.  5. OSA: Using CPAP at night.  6. Ambulate/out of bed.  7. Morbid Obesity- Dietitian recommendations appreciately.   D/C today. She has follow up in HF clinic next week. Will check BMET at that time.   CLEGG,AMY NP-C  12/12/2013 6:57 AM   Patient seen with NP,  agree with the above note.  Ready for discharge today.  She will go home on torsemide 80 mg bid.  She will take metolazone 2.5 mg x 1 if weight goes up 2 lbs in 24 hrs or 3 lbs in a week.   Followup in office next week.   Marca AnconaDalton Keelynn Furgerson 12/12/2013 8:17 AM

## 2013-12-12 NOTE — Plan of Care (Signed)
Problem: Phase III Progression Outcomes Goal: Pain controlled on oral analgesia Outcome: Adequate for Discharge Goal: Activity at appropriate level-compared to baseline (UP IN CHAIR FOR HEMODIALYSIS)  Outcome: Adequate for Discharge Goal: Dyspnea controlled with activity Outcome: Adequate for Discharge Goal: Discharge plan remains appropriate-arrangements made Outcome: Adequate for Discharge Goal: Fluid volume status improved Outcome: Adequate for Discharge Goal: Other Phase III Outcomes/Goals Outcome: Adequate for Discharge

## 2013-12-12 NOTE — Telephone Encounter (Signed)
Pt was d/c from hospital today.. Pt needs a note stating she can go back to work tomorrow .

## 2013-12-12 NOTE — Progress Notes (Signed)
Patient ID: Jasmine [name], female   DOB: 04/12/1970, 43 y.o.   MRN: [id number]  [facility] Progress Note    Assessment/ Plan:   1. AKI on CKD4: Baseline creatinine previously of about 1.9 (labs done in 08/2013) however, more recent baseline appears to be in the mid to upper 2 range. Good UOP in response to torsemide. Re-educated on compliance and will reschedule appt with Dr.Coladonato. 2. Acute on chronic CHF exacerbation: responding well to diuretic therapy with significant improvement of her shortness of breath/weight since admission. 3. Anemia of chronic disease/CKD: ongoing Aranesp therapy and s/p IV iron 4. Diabetes mellitus: on lantus 60u qhs and novolog 10u TID AC with sliding scale qHS. 5. HTN: blood pressures fairly controlled on amlodipine/carvedilol and optimization of diuretic therapy 6. Constipation- gave dulcolax and sorbitol 7. Escherichia coli UTI:on ciprofloxacin based on sensitivities  Subjective:   Reports to be feeling well and looking forward to going home later today   Objective:   BP 166/77 mmHg  Pulse 79  Temp(Src) 97.7 F (36.5 C) (Oral)  Resp 16  Ht 5\' 7"  (1.702 m)  Wt 124.89 kg (275 lb 5.3 oz)  BMI 43.11 kg/m2  SpO2 100%  Intake/Output Summary (Last 24 hours) at 12/12/13 [id number] Last data filed at 12/12/13 [id number]  Gross per 24 hour  Intake   1200 ml  Output   2450 ml  Net  -1250 ml   Weight change: -0.61 kg (-1 lb 5.5 oz)  Physical Exam: ONG:EXBMWUXLKGMGen:comfortably resting in recliner WNU:UVOZDCVS:pulse regular in rate and rhythm, S1 and S2 normal Resp:decreased breath sounds over the bases otherwise clear to auscultation-no distinct rales/rhonchi GUY:QIHKAbd:soft, obese, nontender Ext:2+ lower extremity edema  Imaging: No results found.  Labs: BMET  Recent Labs Lab 12/06/13 0403 12/07/13 0355 12/08/13 0511 12/09/13 0326 12/10/13 0335 12/11/13 1010 12/12/13 0437  NA 135* 139 139 137 140 137 137  K 4.0 4.1 3.9 3.5* 3.7 4.1 4.3  CL 95* 99 98 96 98  94* 98  CO2 20 19 20 20 25 26 22   GLUCOSE 121* 119* 73 66* 162* 178* 108*  BUN 114* 122* 123* 120* 113* 97* 90*  CREATININE 4.26* 4.53* 4.00* 3.67* 3.43* 3.01* 2.83*  CALCIUM 9.3 8.8 9.1 9.3 9.1 9.4 9.4  PHOS  --   --  5.9* 5.4* 5.5*  --   --    CBC  Recent Labs Lab 12/09/13 0326  WBC 7.0  HGB 8.5*  HCT 26.4*  MCV 89.2  PLT 373   Medications:    . amLODipine  5 mg Oral Daily  . antiseptic oral rinse  7 mL Mouth Rinse BID  . aspirin  81 mg Oral Daily  . atorvastatin  80 mg Oral q1800  . carvedilol  12.5 mg Oral BID WC  . clopidogrel  75 mg Oral Q breakfast  . darbepoetin (ARANESP) injection - NON-DIALYSIS  60 mcg Subcutaneous Q Fri-1800  . docusate sodium  100 mg Oral BID  . DULoxetine  60 mg Oral Daily  . enoxaparin (LOVENOX) injection  40 mg Subcutaneous Q24H  . insulin aspart  0-20 Units Subcutaneous TID WC  . insulin aspart  0-5 Units Subcutaneous QHS  . insulin aspart  10 Units Subcutaneous TID WC  . insulin glargine  55 Units Subcutaneous QHS  . l-methylfolate-B6-B12  1 tablet Oral BID  . metoCLOPramide  10 mg Oral TID AC  . potassium chloride  40 mEq Oral BID  . sodium chloride  3 mL Intravenous  Q12H  . torsemide  80 mg Oral BID   Zetta BillsJay Dijon Kohlman, MD 12/12/2013, 9:26 AM

## 2013-12-12 NOTE — Telephone Encounter (Signed)
Talked to Jasmine [name], she will call us back in am with her work fax number, she states she needs note stating she was in the hospital 11/9-11/18;

## 2013-12-12 NOTE — Progress Notes (Signed)
[id number] Cardiac Rehab Pt has been ambulating without difficulty independently. Completed CHF education with pt. She voices understanding. Pt has CHF packet. She knows her sodium and  Restrictions. Pt is knowledgeable about CHF zones. We discussed exercise and diet restrictions. Pt was not weighing daily, but she plans to now and she was eating out. She has difficulty exercising due to neuropathy in her feet. We discussed alternatives to walking. Pt seems motivated to making changes. Beatrix FettersHughes, Linzi Ohlinger G, RN 12/12/2013 10:25 AM

## 2013-12-12 NOTE — Progress Notes (Signed)
Discharge instructions given. Pt verbalized understanding and all questions were answered.  

## 2013-12-13 NOTE — Telephone Encounter (Signed)
Note faxed to pt's work at [phone] stating pt ok to return to work 12/13/13 with no restrictions signed by Dr Shirlee LatchMcLean

## 2013-12-18 ENCOUNTER — Inpatient Hospital Stay (HOSPITAL_COMMUNITY): Admit: 2013-12-18 | Payer: Self-pay

## 2014-02-13 ENCOUNTER — Encounter (HOSPITAL_COMMUNITY): Payer: Self-pay

## 2014-02-27 ENCOUNTER — Encounter (HOSPITAL_COMMUNITY): Payer: Self-pay

## 2014-03-13 ENCOUNTER — Encounter (HOSPITAL_COMMUNITY): Payer: Self-pay

## 2014-03-13 ENCOUNTER — Other Ambulatory Visit (HOSPITAL_COMMUNITY): Payer: Self-pay

## 2014-03-13 MED ORDER — METOLAZONE 2.5 MG PO TABS: ORAL_TABLET | ORAL | Status: DC

## 2014-04-01 ENCOUNTER — Encounter: Payer: Self-pay | Admitting: Internal Medicine

## 2014-04-03 ENCOUNTER — Ambulatory Visit (INDEPENDENT_AMBULATORY_CARE_PROVIDER_SITE_OTHER): Payer: 59 | Admitting: Neurology

## 2014-04-03 ENCOUNTER — Encounter: Payer: Self-pay | Admitting: Neurology

## 2014-04-03 VITALS — BP 122/66 | HR 83 | Ht 67.0 in | Wt 291.5 lb

## 2014-04-03 DIAGNOSIS — IMO0002 Reserved for concepts with insufficient information to code with codable children: Secondary | ICD-10-CM

## 2014-04-03 DIAGNOSIS — R202 Paresthesia of skin: Secondary | ICD-10-CM

## 2014-04-03 DIAGNOSIS — G609 Hereditary and idiopathic neuropathy, unspecified: Secondary | ICD-10-CM

## 2014-04-03 DIAGNOSIS — E1065 Type 1 diabetes mellitus with hyperglycemia: Secondary | ICD-10-CM

## 2014-04-03 DIAGNOSIS — E1069 Type 1 diabetes mellitus with other specified complication: Secondary | ICD-10-CM | POA: Diagnosis not present

## 2014-04-03 DIAGNOSIS — E108 Type 1 diabetes mellitus with unspecified complications: Secondary | ICD-10-CM

## 2014-04-03 MED ORDER — PREGABALIN 75 MG PO CAPS: 75.0000 mg | ORAL_CAPSULE | Freq: Two times a day (BID) | ORAL | Status: DC

## 2014-04-03 NOTE — Progress Notes (Signed)
[facility]    Provider:  Dr Jaynee Eagles Referring Provider: Hayden Rasmussen, MD Primary Care Physician:  Hayden Rasmussen., MD  CC:  neuropathy  HPI:  Jasmine [name] is a 44 y.o. female here as a follow up for neuropathy  She is having bad cramping at night. Muscles are seizing up at night. She is still having a lot of burning in the extremities. Burning is worse at night. Cymbalta is helping with the pain, they used to hurt all the time. She still has the burning in the toe area. It is all day long especially at work. No side effects from the Cymbalta.   Initial visit 10/03/2013: Sister Jasmine [name] is a 44 y.o. female here as a referral from Dr. Darron Doom for pain and paresthesias. Patient has a PMHx of uncontrolled diabetes. She reports pain in the lower legs from the calfs to feet and ankles symmetrically. Feels like fire, needles, cramping, numbness. She can't feel her feet. No symptoms in the fingers. Denies LBP or radicular pain. Balance is off, difficult to walk. Balance is "really bad". Pain can get to 8-9/10. Worse at night in bed. Covers can't touch feet, can't shave legs cause it hurts to touch them. Symptoms started several years ago. She has bad cramps in feet and thighs like a charlie horse that won't go away. Sometimes a hot shower helps momentarily but really nothing makes it better. Pain is all day long, never remits. Neurontin made her dizzy, not sure if it was a high dose or not. Doesn't remember the dose and hesitant to try again. Has had diabetes for 22 years, not well controlled has had DKA several times. Last A1c > 9. She has limited mobility due to the pain in her feet.   Reviewed notes, labs and imaging from outside physicians, which showed that patient has taken vicodin daily for neuropathy, she has bipolar disorder and panic attacks, HTN, DM2, diabetic gastroparesis. Glucose has been as high as 500 in the evenings when patient takes her glucose. Last hgba1c 9.1,    Review of Systems: Patient complains of symptoms per HPI as well as the following symptoms: leg swelling, walking difficulty, apnew, snoring. Pertinent negatives per HPI. All others negative.   History   Social History  . Marital Status: Single    Spouse Name: N/A  . Number of Children: 0  . Years of Education: BA   Occupational History  . [employer] [place] [employer]   Social History Main Topics  . Smoking status: Never Smoker   . Smokeless tobacco: Never Used  . Alcohol Use: Yes     Comment: occasionally  . Drug Use: No  . Sexual Activity: Yes    Birth Control/ Protection: IUD   Other Topics Concern  . Not on file   Social History Narrative   Patient is single with no children.   Patient lives with boyfriend.   Patient is right handed.   Patient has Bachelor's degree.   Patient drinks 1 large cup daily.    Family History  Problem Relation Age of Onset  . Heart attack Mother   . Dementia Father     Past Medical History  Diagnosis Date  . Diastolic heart failure     a. EF 55-60%, grade II DD (05/2013)  . Hypertensive heart disease   . Chronic kidney disease (CKD) stage G4/A1, severely decreased glomerular filtration rate (GFR) between 15-29 mL/min/1.73 square meter and albuminuria creatinine ratio less than 30  mg/g     stage II-III  . Hyperlipidemia   . 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 Anemia   . Diabetic gastroparesis   . Morbid obesity   . Coronary artery disease      a. Cath 2010: non obstructive CAD (30% LAD proximal, mid and distal lesions, 60% diagonal, 30% Dominant mid circumflex) b. Lexiscan Myoview (05/2013) Fixed anteroapical scar of medium sixe, no reversible ischemia  . Pneumonia due to unspecified Streptococcus 10/16/2012  . Type II diabetes mellitus with ophthalmic manifestations   . Bipolar 1 disorder   . Diabetic nephropathy   . Subendocardial infarction   . Gastroparesis   . Depressive disorder    . Cough   . Chronic kidney disease (CKD), stage IV (severe)   . Carpal tunnel syndrome   . Pain in soft tissues of limb   . Allergic rhinitis   . Acute respiratory failure   . Abscess of anal and rectal regions     Past Surgical History  Procedure Laterality Date  . Cholecystectomy    . Carpal tunnel release  2003    Current Outpatient Prescriptions  Medication Sig Dispense Refill  . amLODipine (NORVASC) 10 MG tablet Take 10 mg by mouth daily.    Marland Kitchen aspirin 81 MG tablet Take 81 mg by mouth daily. For pain    . atorvastatin (LIPITOR) 80 MG tablet Take 1 tablet (80 mg total) by mouth daily at 6 PM. 30 tablet 2  . Blood Glucose Monitoring Suppl (ONE TOUCH ULTRA 2) W/DEVICE KIT Check CBG's TID and prn    . carvedilol (COREG) 6.25 MG tablet Take 2 tablets (12.5 mg total) by mouth 2 (two) times daily with a meal. 90 tablet 3  . clopidogrel (PLAVIX) 75 MG tablet Take 1 tablet (75 mg total) by mouth daily with breakfast. 30 tablet 9  . DULoxetine (CYMBALTA) 60 MG capsule Take 1 capsule (60 mg total) by mouth daily. 30 capsule 6  . ferrous sulfate 325 (65 FE) MG tablet Take 325 mg by mouth 2 (two) times daily.      Marland Kitchen glucose blood test strip Check glucose TID and prn.    . insulin glargine (LANTUS) 100 UNIT/ML injection Inject 50 Units into the skin at bedtime.    . insulin lispro (HUMALOG) 100 UNIT/ML injection Inject 12 Units into the skin 3 (three) times daily before meals.    . metoCLOPramide (REGLAN) 10 MG tablet Take 10 mg by mouth 3 (three) times daily before meals. One po qac    . metolazone (ZAROXOLYN) 2.5 MG tablet TAKE 1 TABLET (2.5 MG TOTAL) BY MOUTH if weight goes up 2 lbs in 24 hrs or 3 lbs in a week. 6 tablet 3  . potassium chloride SA (K-DUR,KLOR-CON) 20 MEQ tablet Take 2 tablets (40 mEq total) by mouth 2 (two) times daily. 120 tablet 6  . Prenatal Vit-Fe Fumarate-FA (PRENATAL MULTIVITAMIN) TABS tablet Take 1 tablet by mouth daily at 12 noon.    . torsemide (DEMADEX) 20 MG  tablet Take 4 tablets (80 mg total) by mouth 2 (two) times daily. 240 tablet 3  . acetaminophen (TYLENOL) 500 MG tablet Take 500 mg by mouth every 6 (six) hours as needed for mild pain.    Marland Kitchen albuterol (PROVENTIL HFA;VENTOLIN HFA) 108 (90 BASE) MCG/ACT inhaler Inhale 2 puffs into the lungs every 6 (six) hours as needed for wheezing. (Patient not taking: Reported on 04/03/2014) 1 Inhaler 2  . capsicum (  ZOSTRIX) 0.075 % topical cream Apply 1 application topically 3 (three) times daily. Apply to feet up to 3x daily (Patient not taking: Reported on 04/03/2014) 5 g 3  . HYDROcodone-acetaminophen (NORCO) 5-325 MG per tablet Take 1 tablet by mouth every 6 (six) hours as needed for moderate pain. (Patient not taking: Reported on 04/03/2014) 30 tablet 0  . Jasmine-methylfolate-B6-B12 (METANX) 3-35-2 MG TABS Take 1 tablet by mouth 2 (two) times daily. (Patient not taking: Reported on 04/03/2014) 60 tablet 6   No current facility-administered medications for this visit.    Allergies as of 04/03/2014 - Review Complete 04/03/2014  Allergen Reaction Noted  . Contrast media [iodinated diagnostic agents] Nausea And Vomiting 11/09/2010  . Paroxetine Nausea And Vomiting   . Oxycodone Itching and Rash 11/20/2010    Vitals: BP 122/66 mmHg  Pulse 83  Ht $R'5\' 7"'AF$  (1.702 m)  Wt 291 lb 8 oz (132.224 kg)  BMI 45.64 kg/m2 Last Weight:  Wt Readings from Last 1 Encounters:  04/03/14 291 lb 8 oz (132.224 kg)   Last Height:   Ht Readings from Last 1 Encounters:  04/03/14 $RemoveB'5\' 7"'BiLNYXoF$  (1.702 m)     Tone:  Normal muscle tone.   Posture:  Posture is normal. normal erect   Strength:  Strength is V/V in the upper and lower limbs.     Sensation:  Decreased temp, pp in a glove+stocking Absent vibration at malleous bilat  Reflex Exam:  DTR's:  Absent at ankles, otherwise deep tendon reflexes in the upper and lower extremities are normal bilaterally.   Assessment/Plan: 44 year old female with uncontrolled  diabetes who is here for severe progression of painful paresthesias in her feet which have significantly limited her mobility and cause her much distress. Neuro exam significant for decreased sensation in a glove and stocking distribution as well as allodynia. 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 Serum neuropathy panel to rule out other causes was completed.    In clinical trials, 600 mg alpha-lipoic acid daily has been shown to improve neuropathic deficits.  Continue cymbalta $RemoveBeforeDEI'60mg'RTTbbafkLQucQZfj$  daily for the pain. Discussed common side effects. Can consider increasing in the future. Start Lyrica, CrCl 62 - will repeat CMP today. Discussed common side effects. Can take up to $Rem'300mg'Laev$  divided in 2-3 equal doses    Sarina Ill, MD  [facility] [address] [place], [place] [address]  Phone [phone] Fax [phone]  A total of 30 minutes was spent face-to-face with this patient. Over half this time was spent on counseling patient on the Neuropathy diagnosis and different diagnostic and therapeutic options available.

## 2014-04-03 NOTE — Patient Instructions (Signed)
Lyrica: start 75mg  (one tab) twice daily. In 2 weeks can increase to 150mg  (2 tabs) twice daily as tolerated Alternatively can also take 75ng three times daily  Maximum is 300mg  TOTAL daily

## 2014-04-04 ENCOUNTER — Telehealth: Payer: Self-pay | Admitting: Neurology

## 2014-04-04 LAB — COMPREHENSIVE METABOLIC PANEL
ALT: 13 IU/L (ref 0–32)
AST: 14 IU/L (ref 0–40)
Albumin/Globulin Ratio: 1.2 (ref 1.1–2.5)
Albumin: 3.7 g/dL (ref 3.5–5.5)
Alkaline Phosphatase: 148 IU/L — ABNORMAL HIGH (ref 39–117)
BUN/Creatinine Ratio: 27 — ABNORMAL HIGH (ref 9–23)
BUN: 63 mg/dL — AB (ref 6–24)
Bilirubin Total: 0.3 mg/dL (ref 0.0–1.2)
CHLORIDE: 95 mmol/L — AB (ref 97–108)
CO2: 22 mmol/L (ref 18–29)
Calcium: 8.5 mg/dL — ABNORMAL LOW (ref 8.7–10.2)
Creatinine, Ser: 2.33 mg/dL — ABNORMAL HIGH (ref 0.57–1.00)
GFR calc non Af Amer: 25 mL/min/{1.73_m2} — ABNORMAL LOW (ref >59)
GFR, EST AFRICAN AMERICAN: 29 mL/min/{1.73_m2} — AB (ref >59)
GLOBULIN, TOTAL: 3.1 g/dL (ref 1.5–4.5)
GLUCOSE: 339 mg/dL — AB (ref 65–99)
POTASSIUM: 4.1 mmol/L (ref 3.5–5.2)
Sodium: 135 mmol/L (ref 134–144)
Total Protein: 6.8 g/dL (ref 6.0–8.5)

## 2014-04-04 NOTE — Telephone Encounter (Signed)
Spoke to patient about her CMP, BUn and creatine a little improved from 3 months ago. Her alkphos mildly elkevated. Glucose high but she had just eaten. Patient acknowledges undersatanding. The lyrica making her a little dizzy, advised to her try taking it at night only for a week then go up to 2 a day. If it continues to cause side effects, call and we can discuss.

## 2014-04-07 ENCOUNTER — Other Ambulatory Visit (HOSPITAL_COMMUNITY): Payer: Self-pay | Admitting: Adult Health

## 2014-04-10 ENCOUNTER — Inpatient Hospital Stay (HOSPITAL_COMMUNITY)
Admission: EM | Admit: 2014-04-10 | Discharge: 2014-04-15 | DRG: 291 | Disposition: A | Discharge status: Home / Self Care | Payer: 59 | Attending: Cardiology | Admitting: Cardiology

## 2014-04-10 ENCOUNTER — Emergency Department (HOSPITAL_COMMUNITY): Payer: 59

## 2014-04-10 ENCOUNTER — Encounter (HOSPITAL_COMMUNITY): Payer: Self-pay | Admitting: *Deleted

## 2014-04-10 DIAGNOSIS — E1021 Type 1 diabetes mellitus with diabetic nephropathy: Secondary | ICD-10-CM | POA: Diagnosis present

## 2014-04-10 DIAGNOSIS — D638 Anemia in other chronic diseases classified elsewhere: Secondary | ICD-10-CM | POA: Diagnosis present

## 2014-04-10 DIAGNOSIS — I252 Old myocardial infarction: Secondary | ICD-10-CM

## 2014-04-10 DIAGNOSIS — J9600 Acute respiratory failure, unspecified whether with hypoxia or hypercapnia: Secondary | ICD-10-CM | POA: Diagnosis present

## 2014-04-10 DIAGNOSIS — N184 Chronic kidney disease, stage 4 (severe): Secondary | ICD-10-CM | POA: Diagnosis present

## 2014-04-10 DIAGNOSIS — G4733 Obstructive sleep apnea (adult) (pediatric): Secondary | ICD-10-CM | POA: Diagnosis present

## 2014-04-10 DIAGNOSIS — I5033 Acute on chronic diastolic (congestive) heart failure: Secondary | ICD-10-CM | POA: Diagnosis present

## 2014-04-10 DIAGNOSIS — I130 Hypertensive heart and chronic kidney disease with heart failure and stage 1 through stage 4 chronic kidney disease, or unspecified chronic kidney disease: Secondary | ICD-10-CM | POA: Diagnosis present

## 2014-04-10 DIAGNOSIS — E785 Hyperlipidemia, unspecified: Secondary | ICD-10-CM | POA: Diagnosis present

## 2014-04-10 DIAGNOSIS — Z794 Long term (current) use of insulin: Secondary | ICD-10-CM

## 2014-04-10 DIAGNOSIS — N189 Chronic kidney disease, unspecified: Secondary | ICD-10-CM

## 2014-04-10 DIAGNOSIS — K3184 Gastroparesis: Secondary | ICD-10-CM | POA: Diagnosis present

## 2014-04-10 DIAGNOSIS — Z7902 Long term (current) use of antithrombotics/antiplatelets: Secondary | ICD-10-CM

## 2014-04-10 DIAGNOSIS — Z8249 Family history of ischemic heart disease and other diseases of the circulatory system: Secondary | ICD-10-CM | POA: Diagnosis not present

## 2014-04-10 DIAGNOSIS — Z6841 Body Mass Index (BMI) 40.0 and over, adult: Secondary | ICD-10-CM

## 2014-04-10 DIAGNOSIS — I10 Essential (primary) hypertension: Secondary | ICD-10-CM | POA: Diagnosis not present

## 2014-04-10 DIAGNOSIS — F319 Bipolar disorder, unspecified: Secondary | ICD-10-CM | POA: Diagnosis present

## 2014-04-10 DIAGNOSIS — I2510 Atherosclerotic heart disease of native coronary artery without angina pectoris: Secondary | ICD-10-CM | POA: Diagnosis present

## 2014-04-10 DIAGNOSIS — I5031 Acute diastolic (congestive) heart failure: Secondary | ICD-10-CM | POA: Diagnosis not present

## 2014-04-10 DIAGNOSIS — N179 Acute kidney failure, unspecified: Secondary | ICD-10-CM | POA: Diagnosis not present

## 2014-04-10 DIAGNOSIS — Z9989 Dependence on other enabling machines and devices: Secondary | ICD-10-CM

## 2014-04-10 DIAGNOSIS — R7989 Other specified abnormal findings of blood chemistry: Secondary | ICD-10-CM

## 2014-04-10 DIAGNOSIS — I429 Cardiomyopathy, unspecified: Secondary | ICD-10-CM | POA: Diagnosis present

## 2014-04-10 DIAGNOSIS — I350 Nonrheumatic aortic (valve) stenosis: Secondary | ICD-10-CM

## 2014-04-10 DIAGNOSIS — I248 Other forms of acute ischemic heart disease: Secondary | ICD-10-CM | POA: Diagnosis present

## 2014-04-10 DIAGNOSIS — I5032 Chronic diastolic (congestive) heart failure: Secondary | ICD-10-CM | POA: Diagnosis present

## 2014-04-10 DIAGNOSIS — Z7982 Long term (current) use of aspirin: Secondary | ICD-10-CM

## 2014-04-10 DIAGNOSIS — E109 Type 1 diabetes mellitus without complications: Secondary | ICD-10-CM | POA: Diagnosis present

## 2014-04-10 DIAGNOSIS — D508 Other iron deficiency anemias: Secondary | ICD-10-CM | POA: Diagnosis not present

## 2014-04-10 DIAGNOSIS — D509 Iron deficiency anemia, unspecified: Secondary | ICD-10-CM | POA: Diagnosis not present

## 2014-04-10 DIAGNOSIS — R0602 Shortness of breath: Secondary | ICD-10-CM | POA: Diagnosis present

## 2014-04-10 HISTORY — DX: Dependence on other enabling machines and devices: Z99.89

## 2014-04-10 HISTORY — DX: Obstructive sleep apnea (adult) (pediatric): G47.33

## 2014-04-10 HISTORY — DX: Nonrheumatic aortic (valve) stenosis: I35.0

## 2014-04-10 HISTORY — DX: Type 1 diabetes mellitus without complications: E10.9

## 2014-04-10 LAB — BASIC METABOLIC PANEL
Anion gap: 13 (ref 5–15)
BUN: 78 mg/dL — ABNORMAL HIGH (ref 6–23)
CALCIUM: 8.4 mg/dL (ref 8.4–10.5)
CO2: 24 mmol/L (ref 19–32)
CREATININE: 2.72 mg/dL — AB (ref 0.50–1.10)
Chloride: 96 mmol/L (ref 96–112)
GFR calc Af Amer: 23 mL/min — ABNORMAL LOW (ref >90)
GFR, EST NON AFRICAN AMERICAN: 20 mL/min — AB (ref >90)
Glucose, Bld: 447 mg/dL — ABNORMAL HIGH (ref 70–99)
Potassium: 3.2 mmol/L — ABNORMAL LOW (ref 3.5–5.1)
Sodium: 133 mmol/L — ABNORMAL LOW (ref 135–145)

## 2014-04-10 LAB — CBC
HEMATOCRIT: 28 % — AB (ref 36.0–46.0)
HEMOGLOBIN: 9.1 g/dL — AB (ref 12.0–15.0)
MCH: 28.2 pg (ref 26.0–34.0)
MCHC: 32.5 g/dL (ref 30.0–36.0)
MCV: 86.7 fL (ref 78.0–100.0)
Platelets: 356 10*3/uL (ref 150–400)
RBC: 3.23 MIL/uL — AB (ref 3.87–5.11)
RDW: 14 % (ref 11.5–15.5)
WBC: 12.2 10*3/uL — AB (ref 4.0–10.5)

## 2014-04-10 LAB — MRSA PCR SCREENING: MRSA by PCR: POSITIVE — AB

## 2014-04-10 LAB — GLUCOSE, CAPILLARY
GLUCOSE-CAPILLARY: 332 mg/dL — AB (ref 70–99)
GLUCOSE-CAPILLARY: 374 mg/dL — AB (ref 70–99)

## 2014-04-10 LAB — TROPONIN I
TROPONIN I: 0.32 ng/mL — AB (ref <0.031)
Troponin I: 0.25 ng/mL — ABNORMAL HIGH (ref <0.031)

## 2014-04-10 LAB — I-STAT TROPONIN, ED: Troponin i, poc: 0.11 ng/mL (ref 0.00–0.08)

## 2014-04-10 LAB — BRAIN NATRIURETIC PEPTIDE: B NATRIURETIC PEPTIDE 5: 331.1 pg/mL — AB (ref 0.0–100.0)

## 2014-04-10 MED ORDER — PREGABALIN 75 MG PO CAPS
75.0000 mg | ORAL_CAPSULE | Freq: Two times a day (BID) | ORAL | Status: DC
  Administered 2014-04-10 – 2014-04-15 (×10): 75 mg via ORAL
  Filled 2014-04-10 (×10): qty 1

## 2014-04-10 MED ORDER — NITROGLYCERIN 0.4 MG SL SUBL: 0.4000 mg | SUBLINGUAL_TABLET | SUBLINGUAL | Status: DC | PRN

## 2014-04-10 MED ORDER — INSULIN ASPART 100 UNIT/ML ~~LOC~~ SOLN
0.0000 [IU] | Freq: Three times a day (TID) | SUBCUTANEOUS | Status: DC
  Administered 2014-04-11 – 2014-04-12 (×4): 4 [IU] via SUBCUTANEOUS
  Administered 2014-04-13 – 2014-04-14 (×5): 3 [IU] via SUBCUTANEOUS

## 2014-04-10 MED ORDER — HEPARIN (PORCINE) IN NACL 100-0.45 UNIT/ML-% IJ SOLN
1400.0000 [IU]/h | INTRAMUSCULAR | Status: DC
Start: 2014-04-10 — End: 2014-04-11
  Administered 2014-04-10: 1400 [IU]/h via INTRAVENOUS
  Filled 2014-04-10 (×2): qty 250

## 2014-04-10 MED ORDER — POTASSIUM CHLORIDE CRYS ER 20 MEQ PO TBCR
40.0000 meq | EXTENDED_RELEASE_TABLET | Freq: Once | ORAL | Status: AC
Start: 2014-04-10 — End: 2014-04-10
  Administered 2014-04-10: 40 meq via ORAL
  Filled 2014-04-10: qty 2

## 2014-04-10 MED ORDER — POTASSIUM CHLORIDE CRYS ER 20 MEQ PO TBCR
40.0000 meq | EXTENDED_RELEASE_TABLET | Freq: Two times a day (BID) | ORAL | Status: DC
  Administered 2014-04-10 – 2014-04-15 (×10): 40 meq via ORAL
  Filled 2014-04-10 (×12): qty 2

## 2014-04-10 MED ORDER — ALBUTEROL SULFATE (2.5 MG/3ML) 0.083% IN NEBU: 2.5000 mg | INHALATION_SOLUTION | Freq: Four times a day (QID) | RESPIRATORY_TRACT | Status: DC | PRN

## 2014-04-10 MED ORDER — INSULIN GLARGINE 100 UNIT/ML ~~LOC~~ SOLN
60.0000 [IU] | Freq: Every day | SUBCUTANEOUS | Status: DC
  Administered 2014-04-10 – 2014-04-14 (×5): 60 [IU] via SUBCUTANEOUS
  Filled 2014-04-10 (×6): qty 0.6

## 2014-04-10 MED ORDER — ASPIRIN 81 MG PO CHEW
81.0000 mg | CHEWABLE_TABLET | Freq: Every day | ORAL | Status: DC
  Administered 2014-04-10 – 2014-04-15 (×6): 81 mg via ORAL
  Filled 2014-04-10 (×7): qty 1

## 2014-04-10 MED ORDER — CARVEDILOL 12.5 MG PO TABS
12.5000 mg | ORAL_TABLET | Freq: Two times a day (BID) | ORAL | Status: DC
  Administered 2014-04-11 – 2014-04-15 (×9): 12.5 mg via ORAL
  Filled 2014-04-10 (×12): qty 1

## 2014-04-10 MED ORDER — METOCLOPRAMIDE HCL 10 MG PO TABS
10.0000 mg | ORAL_TABLET | Freq: Three times a day (TID) | ORAL | Status: DC
  Administered 2014-04-11 – 2014-04-15 (×14): 10 mg via ORAL
  Filled 2014-04-10 (×16): qty 1

## 2014-04-10 MED ORDER — DULOXETINE HCL 60 MG PO CPEP
60.0000 mg | ORAL_CAPSULE | Freq: Every day | ORAL | Status: DC
  Administered 2014-04-11 – 2014-04-15 (×5): 60 mg via ORAL
  Filled 2014-04-10 (×5): qty 1

## 2014-04-10 MED ORDER — ASPIRIN 81 MG PO CHEW
324.0000 mg | CHEWABLE_TABLET | Freq: Once | ORAL | Status: AC
  Administered 2014-04-10: 324 mg via ORAL
  Filled 2014-04-10: qty 4

## 2014-04-10 MED ORDER — AMLODIPINE BESYLATE 10 MG PO TABS
10.0000 mg | ORAL_TABLET | Freq: Every day | ORAL | Status: DC
  Administered 2014-04-10 – 2014-04-15 (×6): 10 mg via ORAL
  Filled 2014-04-10 (×6): qty 1

## 2014-04-10 MED ORDER — FUROSEMIDE 10 MG/ML IJ SOLN
80.0000 mg | Freq: Once | INTRAMUSCULAR | Status: AC
  Administered 2014-04-10: 80 mg via INTRAVENOUS
  Filled 2014-04-10: qty 8

## 2014-04-10 MED ORDER — ALBUTEROL SULFATE HFA 108 (90 BASE) MCG/ACT IN AERS: 2.0000 | INHALATION_SPRAY | Freq: Four times a day (QID) | RESPIRATORY_TRACT | Status: DC | PRN

## 2014-04-10 MED ORDER — FUROSEMIDE 10 MG/ML IJ SOLN
80.0000 mg | Freq: Two times a day (BID) | INTRAMUSCULAR | Status: DC
  Administered 2014-04-10: 80 mg via INTRAVENOUS
  Filled 2014-04-10 (×3): qty 8

## 2014-04-10 MED ORDER — FERROUS SULFATE 325 (65 FE) MG PO TABS
325.0000 mg | ORAL_TABLET | Freq: Two times a day (BID) | ORAL | Status: DC
  Administered 2014-04-10 – 2014-04-15 (×10): 325 mg via ORAL
  Filled 2014-04-10 (×11): qty 1

## 2014-04-10 MED ORDER — ONDANSETRON HCL 4 MG/2ML IJ SOLN
4.0000 mg | Freq: Once | INTRAMUSCULAR | Status: AC
  Administered 2014-04-10: 4 mg via INTRAVENOUS
  Filled 2014-04-10: qty 2

## 2014-04-10 MED ORDER — CLOPIDOGREL BISULFATE 75 MG PO TABS
75.0000 mg | ORAL_TABLET | Freq: Every day | ORAL | Status: DC
  Administered 2014-04-11 – 2014-04-15 (×5): 75 mg via ORAL
  Filled 2014-04-10 (×8): qty 1

## 2014-04-10 MED ORDER — ACETAMINOPHEN 500 MG PO TABS
500.0000 mg | ORAL_TABLET | Freq: Four times a day (QID) | ORAL | Status: DC | PRN
Start: 2014-04-10 — End: 2014-04-15
  Administered 2014-04-11 – 2014-04-12 (×2): 500 mg via ORAL
  Filled 2014-04-10 (×2): qty 1

## 2014-04-10 MED ORDER — MORPHINE SULFATE 4 MG/ML IJ SOLN
4.0000 mg | Freq: Once | INTRAMUSCULAR | Status: AC
  Administered 2014-04-10: 4 mg via INTRAVENOUS
  Filled 2014-04-10: qty 1

## 2014-04-10 MED ORDER — NITROGLYCERIN 2 % TD OINT
1.0000 [in_us] | TOPICAL_OINTMENT | Freq: Four times a day (QID) | TRANSDERMAL | Status: DC
  Administered 2014-04-10 – 2014-04-13 (×8): 1 [in_us] via TOPICAL
  Filled 2014-04-10: qty 30
  Filled 2014-04-10 (×2): qty 1
  Filled 2014-04-10 (×4): qty 30

## 2014-04-10 MED ORDER — ATORVASTATIN CALCIUM 80 MG PO TABS
80.0000 mg | ORAL_TABLET | Freq: Every day | ORAL | Status: DC
  Administered 2014-04-11 – 2014-04-14 (×4): 80 mg via ORAL
  Filled 2014-04-10 (×6): qty 1

## 2014-04-10 NOTE — ED Notes (Signed)
Bipap d/c and O2 applied via [place] at 2 L/min.

## 2014-04-10 NOTE — ED Notes (Signed)
o2 nonrebreather applied at 15LPM

## 2014-04-10 NOTE — ED Notes (Signed)
Pt reports hx of heart failure. Started having sob last night and swelling to her legs. ekg done at triage.

## 2014-04-10 NOTE — ED Notes (Signed)
Cards at bedside

## 2014-04-10 NOTE — ED Notes (Signed)
Cards paged and responded. Will put in admission orders.

## 2014-04-10 NOTE — ED Notes (Signed)
Respiratory at bedside.

## 2014-04-10 NOTE — ED Provider Notes (Signed)
CSN: [id number]     Arrival date & time 04/10/14  1021 History   First MD Initiated Contact with Patient 04/10/14 1032     Chief Complaint  Patient presents with  . Shortness of Breath     (Consider location/radiation/quality/duration/timing/severity/associated sxs/prior Treatment) HPI Comments: Patient presents to the ER for evaluation of shortness of breath. Patient reports a history of congestive heart failure and renal disease. She has been noticing increasing weight gain. She reports approximately 20 pound gain over her dry weight. She started having shortness of breath last night and this has progressively worsened. She was unable to sleep last night because of her breathing. She is now experiencing tightness and pain across her chest radiating to left arm.  Patient is a 44 y.o. female presenting with shortness of breath.  Shortness of Breath Associated symptoms: chest pain     Past Medical History  Diagnosis Date  . Diastolic heart failure     a. EF 55-60%, grade II DD (05/2013)  . Hypertensive heart disease   . Chronic kidney disease (CKD) stage G4/A1, severely decreased glomerular filtration rate (GFR) between 15-29 mL/min/1.73 square meter and albuminuria creatinine ratio less than 30 mg/g     stage II-III  . Hyperlipidemia   . 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 Anemia   . Diabetic gastroparesis   . Morbid obesity   . Coronary artery disease      a. Cath 2010: non obstructive CAD (30% LAD proximal, mid and distal lesions, 60% diagonal, 30% Dominant mid circumflex) b. Lexiscan Myoview (05/2013) Fixed anteroapical scar of medium sixe, no reversible ischemia  . Pneumonia due to unspecified Streptococcus 10/16/2012  . Type II diabetes mellitus with ophthalmic manifestations   . Bipolar 1 disorder   . Diabetic nephropathy   . Subendocardial infarction   . Gastroparesis   . Depressive disorder   . Cough   . Chronic kidney disease (CKD),  stage IV (severe)   . Carpal tunnel syndrome   . Pain in soft tissues of limb   . Allergic rhinitis   . Acute respiratory failure   . Abscess of anal and rectal regions    Past Surgical History  Procedure Laterality Date  . Cholecystectomy    . Carpal tunnel release  2003   Family History  Problem Relation Age of Onset  . Heart attack Mother   . Dementia Father    History  Substance Use Topics  . Smoking status: Never Smoker   . Smokeless tobacco: Never Used  . Alcohol Use: Yes     Comment: occasionally   OB History    No data available     Review of Systems  Respiratory: Positive for shortness of breath.   Cardiovascular: Positive for chest pain and leg swelling.  All other systems reviewed and are negative.     Allergies  Contrast media; Paroxetine; and Oxycodone  Home Medications   Prior to Admission medications   Medication Sig Start Date End Date Taking? Authorizing Provider  amLODipine (NORVASC) 10 MG tablet Take 10 mg by mouth daily.   Yes Historical Provider, MD  aspirin 81 MG tablet Take 81 mg by mouth daily. For pain   Yes Historical Provider, MD  atorvastatin (LIPITOR) 80 MG tablet Take 1 tablet (80 mg total) by mouth daily at 6 PM. 05/30/13  Yes Estela Leonie Green, MD  carvedilol (COREG) 6.25 MG tablet TAKE 2 TABLETS (12.5 MG TOTAL) BY MOUTH 2 (  TWO) TIMES DAILY WITH A MEAL. 04/08/14  Yes Amy D Clegg, NP  clopidogrel (PLAVIX) 75 MG tablet Take 1 tablet (75 mg total) by mouth daily with breakfast. 10/10/13  Yes Amy D Clegg, NP  DULoxetine (CYMBALTA) 60 MG capsule Take 1 capsule (60 mg total) by mouth daily. 10/03/13  Yes Melvenia Beam, MD  ferrous sulfate 325 (65 FE) MG tablet Take 325 mg by mouth 2 (two) times daily.     Yes Historical Provider, MD  insulin glargine (LANTUS) 100 UNIT/ML injection Inject 60 Units into the skin at bedtime.    Yes Historical Provider, MD  insulin lispro (HUMALOG) 100 UNIT/ML injection Inject 15 Units into the skin 3  (three) times daily before meals.    Yes Historical Provider, MD  metoCLOPramide (REGLAN) 10 MG tablet Take 10 mg by mouth 3 (three) times daily before meals. One po qac 09/06/13  Yes Historical Provider, MD  metolazone (ZAROXOLYN) 2.5 MG tablet TAKE 1 TABLET (2.5 MG TOTAL) BY MOUTH if weight goes up 2 lbs in 24 hrs or 3 lbs in a week. 03/13/14  Yes Jolaine Artist, MD  potassium chloride SA (K-DUR,KLOR-CON) 20 MEQ tablet Take 2 tablets (40 mEq total) by mouth 2 (two) times daily. 12/12/13  Yes Amy D Clegg, NP  pregabalin (LYRICA) 75 MG capsule Take 1 capsule (75 mg total) by mouth 2 (two) times daily. 04/03/14  Yes Melvenia Beam, MD  Prenatal Vit-Fe Fumarate-FA (PRENATAL MULTIVITAMIN) TABS tablet Take 1 tablet by mouth daily at 12 noon.   Yes Historical Provider, MD  torsemide (DEMADEX) 20 MG tablet Take 4 tablets (80 mg total) by mouth 2 (two) times daily. 09/25/13  Yes Jolaine Artist, MD  acetaminophen (TYLENOL) 500 MG tablet Take 500 mg by mouth every 6 (six) hours as needed for mild pain.    Historical Provider, MD  albuterol (PROVENTIL HFA;VENTOLIN HFA) 108 (90 BASE) MCG/ACT inhaler Inhale 2 puffs into the lungs every 6 (six) hours as needed for wheezing. Patient not taking: Reported on 04/03/2014 10/25/12   Amy D Clegg, NP  Blood Glucose Monitoring Suppl (ONE TOUCH ULTRA 2) W/DEVICE KIT Check CBG's TID and prn 12/13/13   Historical Provider, MD  capsicum (ZOSTRIX) 0.075 % topical cream Apply 1 application topically 3 (three) times daily. Apply to feet up to 3x daily Patient not taking: Reported on 04/03/2014 10/03/13   Melvenia Beam, MD  glucose blood test strip Check glucose TID and prn. 12/13/13 12/13/14  Historical Provider, MD  HYDROcodone-acetaminophen (NORCO) 5-325 MG per tablet Take 1 tablet by mouth every 6 (six) hours as needed for moderate pain. Patient not taking: Reported on 04/03/2014 04/13/13   Velvet Bathe, MD  l-methylfolate-B6-B12 [facility]) 3-35-2 MG TABS Take 1 tablet by mouth 2  (two) times daily. Patient not taking: Reported on 04/03/2014 10/03/13   Melvenia Beam, MD   BP 144/74 mmHg  Pulse 92  Temp(Src) 98 F (36.7 C) (Oral)  Resp 32  SpO2 100% Physical Exam  Constitutional: She is oriented to person, place, and time. She appears well-developed and well-nourished. She appears distressed.  HENT:  Head: Normocephalic and atraumatic.  Right Ear: Hearing normal.  Left Ear: Hearing normal.  Nose: Nose normal.  Mouth/Throat: Oropharynx is clear and moist and mucous membranes are normal.  Eyes: Conjunctivae and EOM are normal. Pupils are equal, round, and reactive to light.  Neck: Normal range of motion. Neck supple.  Cardiovascular: Regular rhythm, S1 normal and S2 normal.  Exam reveals no gallop and no friction rub.   No murmur heard. Pulmonary/Chest: Accessory muscle usage present. Tachypnea noted. No respiratory distress. She has rales. She exhibits no tenderness.  Abdominal: Soft. Normal appearance and bowel sounds are normal. There is no hepatosplenomegaly. There is no tenderness. There is no rebound, no guarding, no tenderness at McBurney's point and negative Murphy's sign. No hernia.  Musculoskeletal: Normal range of motion.  Neurological: She is alert and oriented to person, place, and time. She has normal strength. No cranial nerve deficit or sensory deficit. Coordination normal. GCS eye subscore is 4. GCS verbal subscore is 5. GCS motor subscore is 6.  Skin: Skin is warm, dry and intact. No rash noted. No cyanosis.  Psychiatric: She has a normal mood and affect. Her speech is normal and behavior is normal. Thought content normal.  Nursing note and vitals reviewed.   ED Course  Procedures (including critical care time) Labs Review Labs Reviewed  CBC - Abnormal; Notable for the following:    WBC 12.2 (*)    RBC 3.23 (*)    Hemoglobin 9.1 (*)    HCT 28.0 (*)    All other components within normal limits  BASIC METABOLIC PANEL - Abnormal; Notable for  the following:    Sodium 133 (*)    Potassium 3.2 (*)    Glucose, Bld 447 (*)    BUN 78 (*)    Creatinine, Ser 2.72 (*)    GFR calc non Af Amer 20 (*)    GFR calc Af Amer 23 (*)    All other components within normal limits  BRAIN NATRIURETIC PEPTIDE - Abnormal; Notable for the following:    B Natriuretic Peptide 331.1 (*)    All other components within normal limits  I-STAT TROPOININ, ED - Abnormal; Notable for the following:    Troponin i, poc 0.11 (*)    All other components within normal limits  TROPONIN I    Imaging Review Dg Chest Port 1 View  04/10/2014   CLINICAL DATA:  44 year old female with shortness of breath and abnormal pulmonary auscultation. Initial encounter.  EXAM: PORTABLE CHEST - 1 VIEW  COMPARISON:  12/03/2013 and earlier.  FINDINGS: Portable AP semi upright view at 1029 hours. Stable cardiac size and mediastinal contours. Mildly improved lung volumes. Basilar predominant but diffuse indistinct pulmonary opacity. This is less asymmetric than on the comparison. No pneumothorax, consolidation or pleural effusion identified.  IMPRESSION: Indistinct bilateral pulmonary opacity. Favor acute pulmonary edema over bilateral pneumonia.   Electronically Signed   By: Genevie Ann M.D.   On: 04/10/2014 10:53     EKG Interpretation   Date/Time:  Wednesday April 10 2014 10:25:34 EDT Ventricular Rate:  106 PR Interval:  158 QRS Duration: 86 QT Interval:  364 QTC Calculation: 483 R Axis:   43 Text Interpretation:  Sinus tachycardia Anterior infarct , age  undetermined Abnormal ECG Nonspecific ST abnormality No significant change  since last tracing Confirmed by Venita Seng  MD, [place] [phone]) on  04/10/2014 10:35:15 AM      MDM   Final diagnoses:  None   congestive heart failure exacerbation  Patient presents to the ER for evaluation of shortness of breath. Patient does have a history of congestive heart failure, nonischemic cardiomyopathy with decreased ejection fraction.  Patient reports a 20 pound weight gain. She is visibly dyspneic and tachypnea at arrival. She was hypoxic and well, 83% on room air.  Patient was hypertensive at arrival as well, 171/83. She  was treated with nitroglycerin, aspirin, IV Lasix. She was placed on BiPAP. Blood pressure is improved. She is tolerating BiPAP. Oxygenation is 100% on the BiPAP. She will require hospitalization for further management of acutely decompensated congestive heart failure.  CRITICAL CARE Performed by: Orpah Greek   Total critical care time: 32min  Critical care time was exclusive of separately billable procedures and treating other patients.  Critical care was necessary to treat or prevent imminent or life-threatening deterioration.  Critical care was time spent personally by me on the following activities: development of treatment plan with patient and/or surrogate as well as nursing, discussions with consultants, evaluation of patient's response to treatment, examination of patient, obtaining history from patient or surrogate, ordering and performing treatments and interventions, ordering and review of laboratory studies, ordering and review of radiographic studies, pulse oximetry and re-evaluation of patient's condition.     Orpah Greek, MD 04/10/14 1255

## 2014-04-10 NOTE — Progress Notes (Signed)
ANTICOAGULATION CONSULT NOTE - Initial Consult  Pharmacy Consult for heparin Indication: chest pain/ACS  Allergies  Allergen Reactions  . Contrast Media [Iodinated Diagnostic Agents] Nausea And Vomiting  . Paroxetine Nausea And Vomiting  . Oxycodone Itching and Rash    Patient Measurements: Height: 5\' 7"  (170.2 cm) Weight: 290 lb 14.4 oz (131.951 kg) IBW/kg (Calculated) : 61.6 Heparin Dosing Weight: 100kg  Vital Signs: Temp: 98 F (36.7 C) (03/16 2241) Temp Source: Oral (03/16 2241) BP: 151/67 mmHg (03/16 2241) Pulse Rate: 96 (03/16 2241)  Labs:  Recent Labs  04/10/14 1040 04/10/14 1855  HGB 9.1*  --   HCT 28.0*  --   PLT 356  --   CREATININE 2.72*  --   TROPONINI 0.25* 0.32*    Estimated Creatinine Clearance: 37.8 mL/min (by C-G formula based on Cr of 2.72).   Medical History: Past Medical History  Diagnosis Date  . Diastolic heart failure     a. EF 55-60%, grade II DD (05/2013)  . Hypertensive heart disease   . Hyperlipidemia   . Anemia   . Morbid obesity   . Coronary artery disease      a. Cath 2010: non obstructive CAD (30% LAD proximal, mid and distal lesions, 60% diagonal, 30% Dominant mid circumflex) b. Lexiscan Myoview (05/2013) Fixed anteroapical scar of medium sixe, no reversible ischemia  . Bipolar 1 disorder   . Gastroparesis   . Depressive disorder   . Cough   . Carpal tunnel syndrome   . Pain in soft tissues of limb   . Allergic rhinitis   . Acute respiratory failure   . Abscess of anal and rectal regions   . Hypertension   . CHF (congestive heart failure)   . Subendocardial infarction   . MI (myocardial infarction) 2014; 2015  . Pneumonia due to unspecified Streptococcus 10/16/2012  . OSA on CPAP   . Diabetic gastroparesis   . Type II diabetes mellitus with ophthalmic manifestations   . Diabetic nephropathy   . Chronic kidney disease (CKD), stage IV (severe)   . 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 amLODipine (NORVASC) 10 MG tablet Take 10 mg by mouth daily.   04/09/2014 at Unknown time  . aspirin 81 MG tablet Take 81 mg by mouth daily. For pain   04/09/2014 at Unknown time  . atorvastatin (LIPITOR) 80 MG tablet Take 1 tablet (80 mg total) by mouth daily at 6 PM. 30 tablet 2 04/09/2014 at Unknown time  . carvedilol (COREG) 6.25 MG tablet TAKE 2 TABLETS (12.5 MG TOTAL) BY MOUTH 2 (TWO) TIMES DAILY WITH A MEAL. 90 tablet 3 04/09/2014 at 2030  . clopidogrel (PLAVIX) 75 MG tablet Take 1 tablet (75 mg total) by mouth daily with breakfast. 30 tablet 9 04/09/2014 at Unknown time  . DULoxetine (CYMBALTA) 60 MG capsule Take 1 capsule (60 mg total) by mouth daily. 30 capsule 6 04/09/2014 at Unknown time  . ferrous sulfate 325 (65 FE) MG tablet Take 325 mg by mouth 2 (two) times daily.     04/09/2014 at Unknown time  . insulin glargine (LANTUS) 100 UNIT/ML injection Inject 60 Units into the skin at bedtime.    04/09/2014 at Unknown time  . insulin lispro (HUMALOG) 100 UNIT/ML injection Inject 15 Units into the skin 3 (three) times daily before meals.    04/09/2014 at Unknown time  .  metoCLOPramide (REGLAN) 10 MG tablet Take 10 mg by mouth 3 (three) times daily before meals. One po qac   04/09/2014 at Unknown time  . metolazone (ZAROXOLYN) 2.5 MG tablet TAKE 1 TABLET (2.5 MG TOTAL) BY MOUTH if weight goes up 2 lbs in 24 hrs or 3 lbs in a week. 6 tablet 3 04/09/2014 at Unknown time  . potassium chloride SA (K-DUR,KLOR-CON) 20 MEQ tablet Take 2 tablets (40 mEq total) by mouth 2 (two) times daily. 120 tablet 6 04/09/2014 at Unknown time  . pregabalin (LYRICA) 75 MG capsule Take 1 capsule (75 mg total) by mouth 2 (two) times daily. 60 capsule 3 04/09/2014 at Unknown time  . Prenatal Vit-Fe Fumarate-FA (PRENATAL MULTIVITAMIN) TABS tablet Take 1 tablet by mouth daily at 12 noon.   04/09/2014 at Unknown time  . torsemide  (DEMADEX) 20 MG tablet Take 4 tablets (80 mg total) by mouth 2 (two) times daily. 240 tablet 3 04/09/2014 at Unknown time  . acetaminophen (TYLENOL) 500 MG tablet Take 500 mg by mouth every 6 (six) hours as needed for mild pain.   Unk  . albuterol (PROVENTIL HFA;VENTOLIN HFA) 108 (90 BASE) MCG/ACT inhaler Inhale 2 puffs into the lungs every 6 (six) hours as needed for wheezing. (Patient not taking: Reported on 04/03/2014) 1 Inhaler 2 Not Taking at Unknown time  . Blood Glucose Monitoring Suppl (ONE TOUCH ULTRA 2) W/DEVICE KIT Check CBG's TID and prn   Taking  . capsicum (ZOSTRIX) 0.075 % topical cream Apply 1 application topically 3 (three) times daily. Apply to feet up to 3x daily (Patient not taking: Reported on 04/03/2014) 5 g 3 Not Taking at Unknown time  . glucose blood test strip Check glucose TID and prn.   Taking  . HYDROcodone-acetaminophen (NORCO) 5-325 MG per tablet Take 1 tablet by mouth every 6 (six) hours as needed for moderate pain. (Patient not taking: Reported on 04/03/2014) 30 tablet 0 Completed Course at Unknown time  . l-methylfolate-B6-B12 (METANX) 3-35-2 MG TABS Take 1 tablet by mouth 2 (two) times daily. (Patient not taking: Reported on 04/03/2014) 60 tablet 6 Not Taking at Unknown time   Scheduled:  . amLODipine  10 mg Oral Daily  . aspirin  81 mg Oral Daily  . [START ON 04/11/2014] atorvastatin  80 mg Oral q1800  . [START ON 04/11/2014] carvedilol  12.5 mg Oral BID WC  . [START ON 04/11/2014] clopidogrel  75 mg Oral Q breakfast  . [START ON 04/11/2014] DULoxetine  60 mg Oral Daily  . ferrous sulfate  325 mg Oral BID  . furosemide  80 mg Intravenous BID  . [START ON 04/11/2014] insulin aspart  0-20 Units Subcutaneous TID WC  . insulin glargine  60 Units Subcutaneous QHS  . [START ON 04/11/2014] metoCLOPramide  10 mg Oral TID AC  . nitroGLYCERIN  1 inch Topical 4 times per day  . potassium chloride SA  40 mEq Oral BID  . pregabalin  75 mg Oral BID    Assessment: 44yo female c/o SOB  and swelling to legs, admitted for acute on chronic CHF, also w/ elevated troponin, trending up, to begin heparin.  Goal of Therapy:  Heparin level 0.3-0.7 units/ml Monitor platelets by anticoagulation protocol: Yes   Plan:  Will begin heparin gtt at 1400 units/hr (no bolus per MD request) and monitor heparin levels and CBC.  Wynona Neat, PharmD, BCPS  04/10/2014,10:45 PM

## 2014-04-10 NOTE — ED Notes (Signed)
Dr. Blinda LeatherwoodPollina was notified of the patients 0.11 I-stat troponin.

## 2014-04-10 NOTE — H&P (Signed)
Patient ID: Jasmine [name] MRN: [id number], DOB/AGE: Sep 26, 1970   Admit date: 04/10/2014   Primary Physician: Dois Davenport., MD Primary Cardiologist: Dr. Gala Romney  Pt. Profile:  44 y.o [place] female with history of morbid obesity, nonobstructive CAD per cath in 2010, HTN, OSA, uncontrolled DM type 1, class III CKD and diastolic HF, presenting with dyspnea and weight gain in the setting of acute on chronic diastolic CHF.   Problem List  Past Medical History  Diagnosis Date  . Diastolic heart failure     a. EF 55-60%, grade II DD (05/2013)  . Hypertensive heart disease   . Chronic kidney disease (CKD) stage G4/A1, severely decreased glomerular filtration rate (GFR) between 15-29 mL/min/1.73 square meter and albuminuria creatinine ratio less than 30 mg/g     stage II-III  . Hyperlipidemia   . 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 Anemia   . Diabetic gastroparesis   . Morbid obesity   . Coronary artery disease      a. Cath 2010: non obstructive CAD (30% LAD proximal, mid and distal lesions, 60% diagonal, 30% Dominant mid circumflex) b. Lexiscan Myoview (05/2013) Fixed anteroapical scar of medium sixe, no reversible ischemia  . Pneumonia due to unspecified Streptococcus 10/16/2012  . Type II diabetes mellitus with ophthalmic manifestations   . Bipolar 1 disorder   . Diabetic nephropathy   . Subendocardial infarction   . Gastroparesis   . Depressive disorder   . Cough   . Chronic kidney disease (CKD), stage IV (severe)   . Carpal tunnel syndrome   . Pain in soft tissues of limb   . Allergic rhinitis   . Acute respiratory failure   . Abscess of anal and rectal regions     Past Surgical History  Procedure Laterality Date  . Cholecystectomy    . Carpal tunnel release  2003     Allergies  Allergies  Allergen Reactions  . Contrast Media [Iodinated Diagnostic Agents] Nausea And Vomiting  . Paroxetine Nausea And Vomiting  .  Oxycodone Itching and Rash    HPI The patient is a 44 y.o [place] female with history of morbid obesity, nonobstructive CAD on cath in 2010, HTN, OSA, uncontrolled DM type 1, class III CKD and diastolic HF. 2D echo 05/27/13 demonstrated normal LVF systolic function with EF of 55-60%. She is followed in [facility] by Dr. Gala Romney. She is prescribed 80 mg of Torsemide BID and PRN Metolazone, along with additional medications for HTN and diabetes. She notes that she was recently started on Lyrica for diabetic neuropathy on 04/03/14.   She presents to the [facility] ED today with complaints of progressive weight gain and acute dyspnea that started suddenly last PM. She reports noticing her progressive weight gain over the last 7 days. She has gained at least 10 lbs over the last week. She reports full medication compliance but admits to dietary indiscretion (can soup). 3 days ago, she added PRN metolazone to her diuretic regimen but noticed no improvement. Last night, she developed sudden dyspnea and orthopnea. Symptoms progressed today prompting her to seek medical evaluation. She also notes associated substernal chest pressure radiating to her left arm (currently pain free). She reports that this has happened in the past with her prior acute CHF exacerbations.   On arrival to the ED, she was noted to be tachypnic with accessory muscle use. Rales were noted on physical exam. She was given IV Lasix and  placed on nonrebreather. CXR shows indistinct bilateral pulmonary opacity. Favor acute pulmonary edema over bilateral pneumonia. BNP is elevated at 331.1. Initial troponin mildly elevated at 0.25. Hgb is 9.1. Scr is 2.72 (baseline ~2.5-3.00). K is 3.2. BG is 447. EKG shows sinus tach with HR of 106 and nonspecific ST abnormalities. No significant changes compared to prior EKG. She is normotensive. She notes improved symptoms.    Home Medications  Prior to Admission medications   Medication Sig Start  Date End Date Taking? Authorizing Provider  amLODipine (NORVASC) 10 MG tablet Take 10 mg by mouth daily.   Yes Historical Provider, MD  aspirin 81 MG tablet Take 81 mg by mouth daily. For pain   Yes Historical Provider, MD  atorvastatin (LIPITOR) 80 MG tablet Take 1 tablet (80 mg total) by mouth daily at 6 PM. 05/30/13  Yes Estela Leonie Green, MD  carvedilol (COREG) 6.25 MG tablet TAKE 2 TABLETS (12.5 MG TOTAL) BY MOUTH 2 (TWO) TIMES DAILY WITH A MEAL. 04/08/14  Yes Amy D Clegg, NP  clopidogrel (PLAVIX) 75 MG tablet Take 1 tablet (75 mg total) by mouth daily with breakfast. 10/10/13  Yes Amy D Clegg, NP  DULoxetine (CYMBALTA) 60 MG capsule Take 1 capsule (60 mg total) by mouth daily. 10/03/13  Yes Melvenia Beam, MD  ferrous sulfate 325 (65 FE) MG tablet Take 325 mg by mouth 2 (two) times daily.     Yes Historical Provider, MD  insulin glargine (LANTUS) 100 UNIT/ML injection Inject 60 Units into the skin at bedtime.    Yes Historical Provider, MD  insulin lispro (HUMALOG) 100 UNIT/ML injection Inject 15 Units into the skin 3 (three) times daily before meals.    Yes Historical Provider, MD  metoCLOPramide (REGLAN) 10 MG tablet Take 10 mg by mouth 3 (three) times daily before meals. One po qac 09/06/13  Yes Historical Provider, MD  metolazone (ZAROXOLYN) 2.5 MG tablet TAKE 1 TABLET (2.5 MG TOTAL) BY MOUTH if weight goes up 2 lbs in 24 hrs or 3 lbs in a week. 03/13/14  Yes Jolaine Artist, MD  potassium chloride SA (K-DUR,KLOR-CON) 20 MEQ tablet Take 2 tablets (40 mEq total) by mouth 2 (two) times daily. 12/12/13  Yes Amy D Clegg, NP  pregabalin (LYRICA) 75 MG capsule Take 1 capsule (75 mg total) by mouth 2 (two) times daily. 04/03/14  Yes Melvenia Beam, MD  Prenatal Vit-Fe Fumarate-FA (PRENATAL MULTIVITAMIN) TABS tablet Take 1 tablet by mouth daily at 12 noon.   Yes Historical Provider, MD  torsemide (DEMADEX) 20 MG tablet Take 4 tablets (80 mg total) by mouth 2 (two) times daily. 09/25/13  Yes Jolaine Artist, MD  acetaminophen (TYLENOL) 500 MG tablet Take 500 mg by mouth every 6 (six) hours as needed for mild pain.    Historical Provider, MD  albuterol (PROVENTIL HFA;VENTOLIN HFA) 108 (90 BASE) MCG/ACT inhaler Inhale 2 puffs into the lungs every 6 (six) hours as needed for wheezing. Patient not taking: Reported on 04/03/2014 10/25/12   Amy D Clegg, NP  Blood Glucose Monitoring Suppl (ONE TOUCH ULTRA 2) W/DEVICE KIT Check CBG's TID and prn 12/13/13   Historical Provider, MD  capsicum (ZOSTRIX) 0.075 % topical cream Apply 1 application topically 3 (three) times daily. Apply to feet up to 3x daily Patient not taking: Reported on 04/03/2014 10/03/13   Melvenia Beam, MD  glucose blood test strip Check glucose TID and prn. 12/13/13 12/13/14  Historical Provider, MD  HYDROcodone-acetaminophen (  NORCO) 5-325 MG per tablet Take 1 tablet by mouth every 6 (six) hours as needed for moderate pain. Patient not taking: Reported on 04/03/2014 04/13/13   Velvet Bathe, MD  l-methylfolate-B6-B12 [facility]) 3-35-2 MG TABS Take 1 tablet by mouth 2 (two) times daily. Patient not taking: Reported on 04/03/2014 10/03/13   Melvenia Beam, MD    Family History  Family History  Problem Relation Age of Onset  . Heart attack Mother   . Dementia Father     Social History  History   Social History  . Marital Status: Single    Spouse Name: N/A  . Number of Children: 0  . Years of Education: BA   Occupational History  . [employer] [place] [employer]   Social History Main Topics  . Smoking status: Never Smoker   . Smokeless tobacco: Never Used  . Alcohol Use: Yes     Comment: occasionally  . Drug Use: No  . Sexual Activity: Yes    Birth Control/ Protection: IUD   Other Topics Concern  . Not on file   Social History Narrative   Patient is single with no children.   Patient lives with boyfriend.   Patient is right handed.   Patient has Bachelor's degree.   Patient drinks 1 large cup daily.      Review of Systems General:  No chills, fever, night sweats or weight changes.  Cardiovascular:  + chest pain dyspnea on exertion, edema, orthopnea, no palpitations, paroxysmal nocturnal dyspnea. Dermatological: No rash, lesions/masses Respiratory: No cough, + for dyspnea Urologic: No hematuria, dysuria Abdominal:   No nausea, vomiting, diarrhea, bright red blood per rectum, melena, or hematemesis Neurologic:  No visual changes, wkns, changes in mental status. All other systems reviewed and are otherwise negative except as noted above.  Physical Exam  Blood pressure 144/74, pulse 92, temperature 98 F (36.7 C), temperature source Oral, resp. rate 32, SpO2 100 %.  General: Pleasant, NAD, morbidly obese Psych: Normal affect. Neuro: Alert and oriented X 3. Moves all extremities spontaneously. HEENT: Normal  Neck: Supple without bruits or JVD. Lungs:  Resp regular and unlabored, faint rales over RLL Heart: RRR no s3, s4, or murmurs. Abdomen: Soft, non-tender, non-distended, BS + x 4.  Extremities: No clubbing, cyanosis, chronic pedal edema/ trace bilateral LEE pitting edema. DP/PT/Radials 2+ and equal bilaterally.  Labs  Troponin [facility] Test)  Recent Labs  04/10/14 1110  TROPIPOC 0.11*    Recent Labs  04/10/14 1040  TROPONINI 0.25*   Lab Results  Component Value Date   WBC 12.2* 04/10/2014   HGB 9.1* 04/10/2014   HCT 28.0* 04/10/2014   MCV 86.7 04/10/2014   PLT 356 04/10/2014    Recent Labs Lab 04/10/14 1040  NA 133*  K 3.2*  CL 96  CO2 24  BUN 78*  CREATININE 2.72*  CALCIUM 8.4  GLUCOSE 447*   Lab Results  Component Value Date   CHOL 314* 11/03/2010   HDL 35* 11/03/2010   LDLCALC 247* 11/03/2010   TRIG 159* 11/03/2010   Lab Results  Component Value Date   DDIMER 1.90* 12/03/2013     Radiology/Studies  Dg Chest Port 1 View  04/10/2014   CLINICAL DATA:  44 year old female with shortness of breath and abnormal pulmonary auscultation.  Initial encounter.  EXAM: PORTABLE CHEST - 1 VIEW  COMPARISON:  12/03/2013 and earlier.  FINDINGS: Portable AP semi upright view at 1029 hours. Stable cardiac size and mediastinal contours. Mildly  improved lung volumes. Basilar predominant but diffuse indistinct pulmonary opacity. This is less asymmetric than on the comparison. No pneumothorax, consolidation or pleural effusion identified.  IMPRESSION: Indistinct bilateral pulmonary opacity. Favor acute pulmonary edema over bilateral pneumonia.   Electronically Signed   By: Genevie Ann M.D.   On: 04/10/2014 10:53    ECG  Sinus Tach, Hr 106 bpm. Non specific ST abnormalities.    ASSESSMENT AND PLAN  1. Acute on Chronic Diastolic CHF: Continue IV Lasix, 80 mg TID. Continue antihypertensives. Repeat 2D echo. Low sodium diet, daily weights and strict I/O.s  2. Elevated Troponin: 0.25. Keep on IV heparin. Will trend enzymes.   Signed, Lyda Jester, PA-C 04/10/2014, 1:28 PM   Patient seen with PA, agree with the above note.  1. Acute on chronic diastolic CHF: She is volume overloaded on exam.  Has history of diastolic CHF.  CXR with pulmonary edema, requiring Bipap currently.  She has gained 10 lbs in the last week, became very short of breath starting yesterday.  No definite trigger.   - Lasix 80 mg IV every 8 hrs for now with strict I/Os.  - Echo 2. CKD: Creatinine elevated but at baseline.  She follows with nephrology as outpatient.  3. OSA: Severe, uses CPAP at home.  4. Elevated troponin: Mild elevation.  Patient had chest pain yesterday.  History of nonobstructive CAD on cath.  Suspect this is most likely demand ischemia with CHF/volume overload and hypoxemia.  However, will trend troponin to peak and keep her on heparin gtt for now. Continue Plavix, atorvastatin, and Coreg.  Would avoid cardiac cath if at all possible given CKD.   Loralie Champagne 04/10/2014 2:25 PM

## 2014-04-10 NOTE — ED Notes (Signed)
Pt given a [place] sandwich while waiting for meal tray.

## 2014-04-11 DIAGNOSIS — I509 Heart failure, unspecified: Secondary | ICD-10-CM

## 2014-04-11 DIAGNOSIS — I5031 Acute diastolic (congestive) heart failure: Secondary | ICD-10-CM

## 2014-04-11 LAB — GLUCOSE, CAPILLARY
Glucose-Capillary: 179 mg/dL — ABNORMAL HIGH (ref 70–99)
Glucose-Capillary: 184 mg/dL — ABNORMAL HIGH (ref 70–99)
Glucose-Capillary: 175 mg/dL — ABNORMAL HIGH (ref 70–99)
Glucose-Capillary: 146 mg/dL — ABNORMAL HIGH (ref 70–99)

## 2014-04-11 LAB — BASIC METABOLIC PANEL
ANION GAP: 12 (ref 5–15)
BUN: 82 mg/dL — AB (ref 6–23)
CALCIUM: 8.6 mg/dL (ref 8.4–10.5)
CO2: 23 mmol/L (ref 19–32)
Chloride: 103 mmol/L (ref 96–112)
Creatinine, Ser: 2.98 mg/dL — ABNORMAL HIGH (ref 0.50–1.10)
GFR calc non Af Amer: 18 mL/min — ABNORMAL LOW (ref >90)
GFR, EST AFRICAN AMERICAN: 21 mL/min — AB (ref >90)
GLUCOSE: 158 mg/dL — AB (ref 70–99)
Potassium: 3.8 mmol/L (ref 3.5–5.1)
SODIUM: 138 mmol/L (ref 135–145)

## 2014-04-11 LAB — CBC
HEMATOCRIT: 27.9 % — AB (ref 36.0–46.0)
Hemoglobin: 9 g/dL — ABNORMAL LOW (ref 12.0–15.0)
MCH: 28.6 pg (ref 26.0–34.0)
MCHC: 32.3 g/dL (ref 30.0–36.0)
MCV: 88.6 fL (ref 78.0–100.0)
Platelets: 342 10*3/uL (ref 150–400)
RBC: 3.15 MIL/uL — AB (ref 3.87–5.11)
RDW: 14.4 % (ref 11.5–15.5)
WBC: 9.7 10*3/uL (ref 4.0–10.5)

## 2014-04-11 LAB — TROPONIN I
TROPONIN I: 0.28 ng/mL — AB (ref <0.031)
Troponin I: 0.22 ng/mL — ABNORMAL HIGH (ref <0.031)

## 2014-04-11 MED ORDER — CHLORHEXIDINE GLUCONATE CLOTH 2 % EX PADS
6.0000 | MEDICATED_PAD | Freq: Every day | CUTANEOUS | Status: AC
  Administered 2014-04-11 – 2014-04-15 (×5): 6 via TOPICAL

## 2014-04-11 MED ORDER — FUROSEMIDE 10 MG/ML IJ SOLN
80.0000 mg | Freq: Three times a day (TID) | INTRAMUSCULAR | Status: DC
  Administered 2014-04-11 – 2014-04-12 (×2): 80 mg via INTRAVENOUS
  Filled 2014-04-11 (×3): qty 8

## 2014-04-11 MED ORDER — MUPIROCIN 2 % EX OINT
1.0000 "application " | TOPICAL_OINTMENT | Freq: Two times a day (BID) | CUTANEOUS | Status: DC
  Filled 2014-04-11: qty 22

## 2014-04-11 MED ORDER — HEPARIN SODIUM (PORCINE) 5000 UNIT/ML IJ SOLN
5000.0000 [IU] | Freq: Three times a day (TID) | INTRAMUSCULAR | Status: DC
  Administered 2014-04-11 – 2014-04-14 (×9): 5000 [IU] via SUBCUTANEOUS
  Filled 2014-04-11 (×10): qty 1

## 2014-04-11 MED ORDER — FUROSEMIDE 10 MG/ML IJ SOLN
80.0000 mg | Freq: Three times a day (TID) | INTRAMUSCULAR | Status: DC
  Administered 2014-04-11: 80 mg via INTRAVENOUS
  Filled 2014-04-11 (×2): qty 8

## 2014-04-11 MED ORDER — MUPIROCIN 2 % EX OINT
1.0000 "application " | TOPICAL_OINTMENT | Freq: Two times a day (BID) | CUTANEOUS | Status: DC
  Administered 2014-04-11 – 2014-04-15 (×9): 1 via NASAL
  Filled 2014-04-11: qty 22

## 2014-04-11 NOTE — Care Management Note (Unsigned)
    Page 1 of 1   04/11/2014     4:17:41 PM CARE MANAGEMENT NOTE 04/11/2014  Patient:  Jasmine [name],Jasmine [name]   Account Number:  [id number]  Date Initiated:  04/11/2014  Documentation initiated by:  Meztli Llanas  Subjective/Objective Assessment:   Pt adm on 04/10/14 with CHF exacerbation.  PTA, pt independent, lives with signif. other.     Action/Plan:   Will follow for dc needs as pt progresses.   Anticipated DC Date:  04/14/2014   Anticipated DC Plan:  HOME W HOME HEALTH SERVICES      DC Planning Services  CM consult      Choice offered to / List presented to:             Status of service:  In process, will continue to follow Medicare Important Message given?   (If response is "NO", the following Medicare IM given date fields will be blank) Date Medicare IM given:   Medicare IM given by:   Date Additional Medicare IM given:   Additional Medicare IM given by:    Discharge Disposition:    Per UR Regulation:  Reviewed for med. necessity/level of care/duration of stay  If discussed at Long Length of Stay Meetings, dates discussed:    Comments:

## 2014-04-11 NOTE — Progress Notes (Signed)
Advanced Heart Failure Rounding Note   Subjective:    44 y.o [place] female with history of morbid obesity, nonobstructive CAD per cath in 2010, HTN, OSA, uncontrolled DM type 1, class III CKD and diastolic HF, presenting with dyspnea and weight gain in the setting of acute on chronic diastolic CHF.   Admitted yesterday with increased dyspnea. She received a total of 160 mg IV lasix. Weight unchanged. Feeling better. Denies CP.   Troponin 0.25>0.32>0.28  Creatinine 2.72  Objective:   Weight Range:  Vital Signs:   Temp:  [98 F (36.7 C)-98.3 F (36.8 C)] 98.3 F (36.8 C) (03/17 1610) Pulse Rate:  [76-101] 92 (03/17 0613) Resp:  [13-32] 18 (03/16 2241) BP: (100-173)/(60-98) 144/68 mmHg (03/17 0613) SpO2:  [83 %-100 %] 99 % (03/17 0613) FiO2 (%):  [50 %] 50 % (03/16 1616) Weight:  [290 lb 9.6 oz (131.815 kg)-290 lb 14.4 oz (131.951 kg)] 290 lb 9.6 oz (131.815 kg) (03/17 0500) Last BM Date: 04/10/14  Weight change: Filed Weights   04/10/14 1936 04/11/14 0500  Weight: 290 lb 14.4 oz (131.951 kg) 290 lb 9.6 oz (131.815 kg)    Intake/Output:   Intake/Output Summary (Last 24 hours) at 04/11/14 9604 Last data filed at 04/11/14 0600  Gross per 24 hour  Intake 574.03 ml  Output    800 ml  Net -225.97 ml    General: Pleasant, NAD, morbidly obese Psych: Normal affect. Neuro: Alert and oriented X 3. Moves all extremities spontaneously. HEENT: Normal Neck: Supple without bruits . JVP elevated. Lungs: Resp regular and unlabored, faint rales over RLL Heart: RRR no s3, s4, or murmurs. Abdomen: obese, Soft, non-tender, non-distended, BS + x 4.  Extremities: No clubbing, cyanosis, chronic pedal edema/ trace bilateral LEE pitting edema. DP/PT/Radials 2+ and equal bilaterally.   Telemetry: SR 90s   Labs: Basic Metabolic Panel:  Recent Labs Lab 04/10/14 1040  NA 133*  K 3.2*  CL 96  CO2 24  GLUCOSE 447*  BUN 78*  CREATININE 2.72*  CALCIUM 8.4     Liver Function Tests: No results for input(s): AST, ALT, ALKPHOS, BILITOT, PROT, ALBUMIN in the last 168 hours. No results for input(s): LIPASE, AMYLASE in the last 168 hours. No results for input(s): AMMONIA in the last 168 hours.  CBC:  Recent Labs Lab 04/10/14 1040  WBC 12.2*  HGB 9.1*  HCT 28.0*  MCV 86.7  PLT 356    Cardiac Enzymes:  Recent Labs Lab 04/10/14 1040 04/10/14 1855 04/11/14 0134  TROPONINI 0.25* 0.32* 0.28*    BNP: BNP (last 3 results)  Recent Labs  04/10/14 1040  BNP 331.1*    ProBNP (last 3 results)  Recent Labs  05/25/13 0545 12/03/13 0305  PROBNP 3258.0* 2451.0*      Other results:  EKG:   Imaging: Dg Chest Port 1 View  04/10/2014   CLINICAL DATA:  44 year old female with shortness of breath and abnormal pulmonary auscultation. Initial encounter.  EXAM: PORTABLE CHEST - 1 VIEW  COMPARISON:  12/03/2013 and earlier.  FINDINGS: Portable AP semi upright view at 1029 hours. Stable cardiac size and mediastinal contours. Mildly improved lung volumes. Basilar predominant but diffuse indistinct pulmonary opacity. This is less asymmetric than on the comparison. No pneumothorax, consolidation or pleural effusion identified.  IMPRESSION: Indistinct bilateral pulmonary opacity. Favor acute pulmonary edema over bilateral pneumonia.   Electronically Signed   By: Odessa Fleming M.D.   On: 04/10/2014 10:53      Medications:  Scheduled Medications: . amLODipine  10 mg Oral Daily  . aspirin  81 mg Oral Daily  . atorvastatin  80 mg Oral q1800  . carvedilol  12.5 mg Oral BID WC  . Chlorhexidine Gluconate Cloth  6 each Topical Q0600  . clopidogrel  75 mg Oral Q breakfast  . DULoxetine  60 mg Oral Daily  . ferrous sulfate  325 mg Oral BID  . furosemide  80 mg Intravenous BID  . insulin aspart  0-20 Units Subcutaneous TID WC  . insulin glargine  60 Units Subcutaneous QHS  . metoCLOPramide  10 mg Oral TID AC  . mupirocin ointment  1 application  Nasal BID  . nitroGLYCERIN  1 inch Topical 4 times per day  . potassium chloride SA  40 mEq Oral BID  . pregabalin  75 mg Oral BID     Infusions: . heparin 1,400 Units/hr (04/10/14 2317)     PRN Medications:  acetaminophen, albuterol, nitroGLYCERIN   Assessment/Plan    1. Acute on chronic diastolic CHF: She is volume overloaded on exam. Has history of diastolic CHF. CXR with pulmonary edema, requiring Bipap currently. She has gained 10 lbs in the last week, became very short of breath starting yesterday. No definite trigger. Weight unchanged. Continue Lasix 80 mg IV every 8 hrs for now with strict I/Os.  - Check ECHO. 2. CKD: Creatinine elevated but at baseline. She follows with nephrology as outpatient.  3. OSA: Severe, uses CPAP at home.  4. Elevated troponin: Mild elevation. Patient had chest pain briefly prior to admission. History of nonobstructive CAD on cath. Suspect this is most likely demand ischemia with CHF/volume overload and hypoxemia. Troponin with flat trend.  Think we can stop heparin gtt.  Continue Plavix, atorvastatin, and Coreg. Would avoid cardiac cath if at all possible given CKD. Not having chest pain today.  5. A/C respiratory failure - off bipap. Now on nasal cannula.   Length of Stay: 1  CLEGG,AMY NP-C 04/11/2014, 8:12 AM  Advanced Heart Failure Team Pager [phone] (M-F; 7a - 4p)  Please contact [facility] for night-coverage after hours (4p -7a ) and weekends on [email or web address]  Patient seen with NP, agree with the above note.  She remains volume overloaded, continue Lasix 80 mg IV every 8 hrs.  Flat troponin with no CP, suspect demand ischemia in setting of CKD.  Stop heparin gtt (use [place] heparin).   Marca AnconaDalton McLean 04/11/2014 8:24 AM

## 2014-04-11 NOTE — Progress Notes (Signed)
Dr.Jacob Tresa EndoKelly is aware of elevated Troponin and ordered to start Heparin IV.

## 2014-04-11 NOTE — Progress Notes (Signed)
  Echocardiogram 2D Echocardiogram has been performed.  Janalyn HarderWest, Toba Claudio R 04/11/2014, 3:59 PM

## 2014-04-11 NOTE — Progress Notes (Signed)
Pt placed on BIPAP auto and 2 lpm .

## 2014-04-12 DIAGNOSIS — D508 Other iron deficiency anemias: Secondary | ICD-10-CM

## 2014-04-12 DIAGNOSIS — N179 Acute kidney failure, unspecified: Secondary | ICD-10-CM

## 2014-04-12 LAB — CBC
HCT: 23.1 % — ABNORMAL LOW (ref 36.0–46.0)
HCT: 25.2 % — ABNORMAL LOW (ref 36.0–46.0)
HEMOGLOBIN: 8.3 g/dL — AB (ref 12.0–15.0)
Hemoglobin: 7.5 g/dL — ABNORMAL LOW (ref 12.0–15.0)
MCH: 28.8 pg (ref 26.0–34.0)
MCH: 29.3 pg (ref 26.0–34.0)
MCHC: 32.5 g/dL (ref 30.0–36.0)
MCHC: 32.9 g/dL (ref 30.0–36.0)
MCV: 88.8 fL (ref 78.0–100.0)
MCV: 89 fL (ref 78.0–100.0)
Platelets: 358 10*3/uL (ref 150–400)
Platelets: UNDETERMINED 10*3/uL (ref 150–400)
RBC: 2.6 MIL/uL — ABNORMAL LOW (ref 3.87–5.11)
RBC: 2.83 MIL/uL — ABNORMAL LOW (ref 3.87–5.11)
RDW: 14.5 % (ref 11.5–15.5)
RDW: 14.5 % (ref 11.5–15.5)
WBC: 11.2 10*3/uL — ABNORMAL HIGH (ref 4.0–10.5)
WBC: 11 10*3/uL — ABNORMAL HIGH (ref 4.0–10.5)

## 2014-04-12 LAB — BASIC METABOLIC PANEL
ANION GAP: 10 (ref 5–15)
BUN: 88 mg/dL — ABNORMAL HIGH (ref 6–23)
CHLORIDE: 103 mmol/L (ref 96–112)
CO2: 25 mmol/L (ref 19–32)
Calcium: 8.6 mg/dL (ref 8.4–10.5)
Creatinine, Ser: 3.16 mg/dL — ABNORMAL HIGH (ref 0.50–1.10)
GFR calc Af Amer: 20 mL/min — ABNORMAL LOW (ref >90)
GFR calc non Af Amer: 17 mL/min — ABNORMAL LOW (ref >90)
GLUCOSE: 101 mg/dL — AB (ref 70–99)
POTASSIUM: 4 mmol/L (ref 3.5–5.1)
SODIUM: 138 mmol/L (ref 135–145)

## 2014-04-12 LAB — VITAMIN B12: Vitamin B-12: 1516 pg/mL — ABNORMAL HIGH (ref 211–911)

## 2014-04-12 LAB — GLUCOSE, CAPILLARY
Glucose-Capillary: 95 mg/dL (ref 70–99)
Glucose-Capillary: 109 mg/dL — ABNORMAL HIGH (ref 70–99)
Glucose-Capillary: 198 mg/dL — ABNORMAL HIGH (ref 70–99)
Glucose-Capillary: 234 mg/dL — ABNORMAL HIGH (ref 70–99)

## 2014-04-12 LAB — IRON AND TIBC
Iron: 42 ug/dL (ref 42–145)
SATURATION RATIOS: 21 % (ref 20–55)
TIBC: 204 ug/dL — ABNORMAL LOW (ref 250–470)
UIBC: 162 ug/dL (ref 125–400)

## 2014-04-12 LAB — FERRITIN: Ferritin: 720 ng/mL — ABNORMAL HIGH (ref 10–291)

## 2014-04-12 MED ORDER — TORSEMIDE 20 MG PO TABS
80.0000 mg | ORAL_TABLET | Freq: Two times a day (BID) | ORAL | Status: DC
  Administered 2014-04-12 – 2014-04-13 (×2): 80 mg via ORAL
  Filled 2014-04-12 (×4): qty 4

## 2014-04-12 NOTE — Progress Notes (Signed)
Pt stated she places self on and off BIPAP. She will placed her self on when ready. Auto mode 20 max 5 min. RT made pt aware that if she needed any assistance to call RT.

## 2014-04-12 NOTE — Plan of Care (Signed)
Problem: Phase I Progression Outcomes Goal: EF % per last Echo/documented,Core Reminder form on chart Outcome: Completed/Met Date Met:  04/12/14 EF performed on 04/11/2014 EF% result is 55-60%

## 2014-04-12 NOTE — Progress Notes (Addendum)
Patient ID: Jasmine [name], female   DOB: [birth year], 44 y.o.   MRN: [id number] Advanced Heart Failure Rounding Note   Subjective:    44 y.o [place] female with history of morbid obesity, nonobstructive CAD per cath in 2010, HTN, OSA, uncontrolled DM type 1, class III CKD and diastolic HF, presenting with dyspnea and weight gain in the setting of acute on chronic diastolic CHF.   She feels much better today compared to admission, off oxygen now.  I/Os not well recorded but weight really has not changed much.  Creatinine coming up, hemoglobin down.   Troponin 0.25>0.32>0.28  Creatinine 2.72 > 3.16  Objective:   Weight Range:  Vital Signs:   Temp:  [98.1 F (36.7 C)-98.6 F (37 C)] 98.1 F (36.7 C) (03/18 0539) Pulse Rate:  [79-86] 82 (03/18 0841) Resp:  [18] 18 (03/18 0539) BP: (111-145)/(48-62) 124/48 mmHg (03/18 0841) SpO2:  [95 %-98 %] 98 % (03/18 0539) Weight:  [292 lb 3.2 oz (132.541 kg)] 292 lb 3.2 oz (132.541 kg) (03/18 0539) Last BM Date: 04/08/14  Weight change: Filed Weights   04/10/14 1936 04/11/14 0500 04/12/14 0539  Weight: 290 lb 14.4 oz (131.951 kg) 290 lb 9.6 oz (131.815 kg) 292 lb 3.2 oz (132.541 kg)    Intake/Output:   Intake/Output Summary (Last 24 hours) at 04/12/14 0917 Last data filed at 04/12/14 0600  Gross per 24 hour  Intake   1352 ml  Output   1600 ml  Net   -248 ml    General: Pleasant, NAD, morbidly obese Psych: Normal affect. Neuro: Alert and oriented X 3. Moves all extremities spontaneously. HEENT: Normal Neck: Supple without bruits. JVP 10 cm Lungs: Resp regular and unlabored, faint rales over RLL Heart: RRR no s3, s4, or murmurs. Abdomen: obese, Soft, non-tender, non-distended, BS + x 4.  Extremities: No clubbing, cyanosis,Trace ankle edema  Telemetry: SR 90s   Labs: Basic Metabolic Panel:  Recent Labs Lab 04/10/14 1040 04/11/14 0806 04/12/14 0415  NA 133* 138 138  K 3.2* 3.8 4.0  CL 96 103 103  CO2 GLUCOSE 447* 158* 101*  BUN 78* 82* 88*  CREATININE 2.72* 2.98* 3.16*  CALCIUM 8.4 8.6 8.6    Liver Function Tests: No results for input(s): AST, ALT, ALKPHOS, BILITOT, PROT, ALBUMIN in the last 168 hours. No results for input(s): LIPASE, AMYLASE in the last 168 hours. No results for input(s): AMMONIA in the last 168 hours.  CBC:  Recent Labs Lab 04/10/14 1040 04/11/14 0806 04/12/14 0415  WBC 12.2* 9.7 11.2*  HGB 9.1* 9.0* 7.5*  HCT 28.0* 27.9* 23.1*  MCV 86.7 88.6 88.8  PLT 356 342 358    Cardiac Enzymes:  Recent Labs Lab 04/10/14 1040 04/10/14 1855 04/11/14 0134 04/11/14 0806  TROPONINI 0.25* 0.32* 0.28* 0.22*    BNP: BNP (last 3 results)  Recent Labs  04/10/14 1040  BNP 331.1*    ProBNP (last 3 results)  Recent Labs  05/25/13 0545 12/03/13 0305  PROBNP 3258.0* 2451.0*      Other results:  EKG:   Imaging: Dg Chest Port 1 View  04/10/2014   CLINICAL DATA:  44 year old female with shortness of breath and abnormal pulmonary auscultation. Initial encounter.  EXAM: PORTABLE CHEST - 1 VIEW  COMPARISON:  12/03/2013 and earlier.  FINDINGS: Portable AP semi upright view at 1029 hours. Stable cardiac size and mediastinal contours. Mildly improved lung volumes. Basilar predominant but diffuse indistinct pulmonary opacity. This is less  asymmetric than on the comparison. No pneumothorax, consolidation or pleural effusion identified.  IMPRESSION: Indistinct bilateral pulmonary opacity. Favor acute pulmonary edema over bilateral pneumonia.   Electronically Signed   By: Odessa FlemingH  Hall M.D.   On: 04/10/2014 10:53     Medications:     Scheduled Medications: . amLODipine  10 mg Oral Daily  . aspirin  81 mg Oral Daily  . atorvastatin  80 mg Oral q1800  . carvedilol  12.5 mg Oral BID WC  . Chlorhexidine Gluconate Cloth  6 each Topical Q0600  . clopidogrel  75 mg Oral Q breakfast  . DULoxetine  60 mg Oral Daily  . ferrous sulfate  325 mg Oral BID  .  furosemide  80 mg Intravenous 3 times per day  . heparin subcutaneous  5,000 Units Subcutaneous 3 times per day  . insulin aspart  0-20 Units Subcutaneous TID WC  . insulin glargine  60 Units Subcutaneous QHS  . metoCLOPramide  10 mg Oral TID AC  . mupirocin ointment  1 application Nasal BID  . nitroGLYCERIN  1 inch Topical 4 times per day  . potassium chloride SA  40 mEq Oral BID  . pregabalin  75 mg Oral BID  . torsemide  80 mg Oral BID    Infusions:    PRN Medications: acetaminophen, albuterol, nitroGLYCERIN   Assessment/Plan    1. Acute on chronic diastolic CHF: Has history of diastolic CHF, admitted with acute exacerbation requiring Bipap. I/Os not well-recorded but weight not down.  However, she feels much better and is off oxygen.  Creatinine coming up.   - Ambulate halls today.  - Will put her back on torsemide 80 mg bid.  2. CKD: Creatinine trending up.  Back to po diuretics.  3. OSA: Severe, uses CPAP at home.  4. Elevated troponin: Mild elevation. Patient had chest pain briefly prior to admission. History of nonobstructive CAD on cath. Suspect this is most likely demand ischemia with CHF/volume overload and hypoxemia. Troponin with flat trend.  Think we can stop heparin gtt.  Continue Plavix, atorvastatin, and Coreg. Would avoid cardiac cath if at all possible given CKD. No further chest pain.  5. Anemia: Baseline low hemoglobin attributed to CKD.  Down to 7.5 today.  No BRBPR or melena.  She is on iron.  Repeat CBC at noon.  Will check iron stores, may give IV iron.  Would hold transfusion until hemoglobin < 7.  Check stool for occult blood.  Length of Stay: 2  Marca AnconaDalton McLean  04/12/2014, 9:17 AM

## 2014-04-13 DIAGNOSIS — N183 Chronic kidney disease, stage 3 (moderate): Secondary | ICD-10-CM

## 2014-04-13 DIAGNOSIS — I10 Essential (primary) hypertension: Secondary | ICD-10-CM

## 2014-04-13 LAB — BASIC METABOLIC PANEL
ANION GAP: 12 (ref 5–15)
BUN: 89 mg/dL — AB (ref 6–23)
CO2: 21 mmol/L (ref 19–32)
Calcium: 8.5 mg/dL (ref 8.4–10.5)
Chloride: 102 mmol/L (ref 96–112)
Creatinine, Ser: 3.15 mg/dL — ABNORMAL HIGH (ref 0.50–1.10)
GFR, EST AFRICAN AMERICAN: 20 mL/min — AB (ref >90)
GFR, EST NON AFRICAN AMERICAN: 17 mL/min — AB (ref >90)
GLUCOSE: 186 mg/dL — AB (ref 70–99)
Potassium: 4.6 mmol/L (ref 3.5–5.1)
SODIUM: 135 mmol/L (ref 135–145)

## 2014-04-13 LAB — GLUCOSE, CAPILLARY
GLUCOSE-CAPILLARY: 124 mg/dL — AB (ref 70–99)
Glucose-Capillary: 127 mg/dL — ABNORMAL HIGH (ref 70–99)
Glucose-Capillary: 129 mg/dL — ABNORMAL HIGH (ref 70–99)
Glucose-Capillary: 134 mg/dL — ABNORMAL HIGH (ref 70–99)

## 2014-04-13 LAB — CBC
HEMATOCRIT: 24.4 % — AB (ref 36.0–46.0)
HEMOGLOBIN: 7.7 g/dL — AB (ref 12.0–15.0)
MCH: 28.1 pg (ref 26.0–34.0)
MCHC: 31.6 g/dL (ref 30.0–36.0)
MCV: 89.1 fL (ref 78.0–100.0)
Platelets: 323 10*3/uL (ref 150–400)
RBC: 2.74 MIL/uL — AB (ref 3.87–5.11)
RDW: 14.6 % (ref 11.5–15.5)
WBC: 8.5 10*3/uL (ref 4.0–10.5)

## 2014-04-13 NOTE — Progress Notes (Signed)
Patient stated she places herself on and off of CPAP when ready. RT informed patient to call for RT if she needed any assistance.

## 2014-04-13 NOTE — Progress Notes (Signed)
Patient ID: Jasmine [name], female   DOB: 03-Feb-1970, 44 y.o.   MRN: [id number]     Subjective:    44 y.o [place] female with history of morbid obesity, nonobstructive CAD per cath in 2010, HTN, OSA, uncontrolled DM type 1, class III CKD and diastolic HF, presenting with dyspnea and weight gain in the setting of acute on chronic diastolic CHF.   She feels much better today compared to admission, off oxygen now.  I/Os -1000 in the last 24 hours.  Creatinine up, stable since yesterday, hemoglobin down, iron studies - low normal.   Troponin 0.25>0.32>0.28  Creatinine 2.72 > 3.16>3.15  Objective:   Weight Range:  Vital Signs:   Temp:  [98.4 F (36.9 C)-98.7 F (37.1 C)] 98.6 F (37 C) (03/19 0527) Pulse Rate:  [79-82] 80 (03/19 0527) Resp:  [18-20] 18 (03/19 0527) BP: (107-147)/(47-64) 147/64 mmHg (03/19 0527) SpO2:  [92 %-97 %] 92 % (03/19 0527) Weight:  [291 lb 11.2 oz (132.314 kg)] 291 lb 11.2 oz (132.314 kg) (03/19 0527) Last BM Date: 04/12/14  Weight change: Filed Weights   04/11/14 0500 04/12/14 0539 04/13/14 0527  Weight: 290 lb 9.6 oz (131.815 kg) 292 lb 3.2 oz (132.541 kg) 291 lb 11.2 oz (132.314 kg)    Intake/Output:   Intake/Output Summary (Last 24 hours) at 04/13/14 1228 Last data filed at 04/13/14 0500  Gross per 24 hour  Intake    440 ml  Output   1450 ml  Net  -1010 ml    General: Pleasant, NAD, morbidly obese Psych: Normal affect. Neuro: Alert and oriented X 3. Moves all extremities spontaneously. HEENT: Normal Neck: Supple without bruits. JVP 10 cm Lungs: Resp regular and unlabored, faint rales over RLL Heart: RRR no s3, s4, or 2/6 systolic murmur. Abdomen: obese, Soft, non-tender, non-distended, BS + x 4.  Extremities: No clubbing, cyanosis,Trace ankle edema  Telemetry: SR 90s   Labs: Basic Metabolic Panel:  Recent Labs Lab 04/10/14 1040 04/11/14 0806 04/12/14 0415 04/13/14 0310  NA 133* 138 138 135  K 3.2* 3.8 4.0 4.6    CL 96 103 103 102  CO2 24 23 25 21   GLUCOSE 447* 158* 101* 186*  BUN 78* 82* 88* 89*  CREATININE 2.72* 2.98* 3.16* 3.15*  CALCIUM 8.4 8.6 8.6 8.5   CBC:  Recent Labs Lab 04/10/14 1040 04/11/14 0806 04/12/14 0415 04/12/14 1009 04/13/14 0615  WBC 12.2* 9.7 11.2* 11.0* 8.5  HGB 9.1* 9.0* 7.5* 8.3* 7.7*  HCT 28.0* 27.9* 23.1* 25.2* 24.4*  MCV 86.7 88.6 88.8 89.0 89.1  PLT 356 342 358 PLATELET CLUMPS NOTED ON SMEAR, UNABLE TO ESTIMATE 323    Cardiac Enzymes:  Recent Labs Lab 04/10/14 1040 04/10/14 1855 04/11/14 0134 04/11/14 0806  TROPONINI 0.25* 0.32* 0.28* 0.22*    BNP: BNP (last 3 results)  Recent Labs  04/10/14 1040  BNP 331.1*    ProBNP (last 3 results)  Recent Labs  05/25/13 0545 12/03/13 0305  PROBNP 3258.0* 2451.0*   Other results:  EKG:   Imaging: No results found.   Medications:     Scheduled Medications: . amLODipine  10 mg Oral Daily  . aspirin  81 mg Oral Daily  . atorvastatin  80 mg Oral q1800  . carvedilol  12.5 mg Oral BID WC  . Chlorhexidine Gluconate Cloth  6 each Topical Q0600  . clopidogrel  75 mg Oral Q breakfast  . DULoxetine  60 mg Oral Daily  . ferrous sulfate  325  mg Oral BID  . heparin subcutaneous  5,000 Units Subcutaneous 3 times per day  . insulin aspart  0-20 Units Subcutaneous TID WC  . insulin glargine  60 Units Subcutaneous QHS  . metoCLOPramide  10 mg Oral TID AC  . mupirocin ointment  1 application Nasal BID  . nitroGLYCERIN  1 inch Topical 4 times per day  . potassium chloride SA  40 mEq Oral BID  . pregabalin  75 mg Oral BID  . torsemide  80 mg Oral BID   PRN Medications: acetaminophen, albuterol, nitroGLYCERIN   Assessment/Plan    1. Acute on chronic diastolic CHF: Has history of diastolic CHF, admitted with acute exacerbation requiring Bipap. I/Os not well-recorded but weight not down.  However, she feels much better and is off oxygen.  Creatinine up. Unchanged from yesterday, we will hold  torsemide tonight.   - Ambulate halls today.  2. CKD: Creatinine up. Unchanged from yesterday, we will hold torsemide tonight.   3. OSA: Severe, uses CPAP at home.  4. Elevated troponin: Mild elevation. Patient had chest pain briefly prior to admission. History of nonobstructive CAD on cath. Suspect this is most likely demand ischemia with CHF/volume overload and hypoxemia. Troponin with flat trend.  Think we can stop heparin gtt.  Continue Plavix, atorvastatin, and Coreg. Would avoid cardiac cath if at all possible given CKD. No further chest pain.  5. Anemia: Baseline low hemoglobin attributed to CKD.  Down to 7.7 today.  MCV nomal. No BRBPR or melena.  She is on iron.  Repeat CBC in the am.  Iron stores low normal, no IV iron for now.  Would hold transfusion until hemoglobin < 7.  Check stool for occult blood.  Length of Stay: 3  Tobias Alexander H,MD 04/13/2014, 12:28 PM

## 2014-04-14 DIAGNOSIS — D509 Iron deficiency anemia, unspecified: Secondary | ICD-10-CM

## 2014-04-14 LAB — GLUCOSE, CAPILLARY
GLUCOSE-CAPILLARY: 54 mg/dL — AB (ref 70–99)
GLUCOSE-CAPILLARY: 99 mg/dL (ref 70–99)
GLUCOSE-CAPILLARY: 132 mg/dL — AB (ref 70–99)
GLUCOSE-CAPILLARY: 149 mg/dL — AB (ref 70–99)
Glucose-Capillary: 60 mg/dL — ABNORMAL LOW (ref 70–99)
Glucose-Capillary: 194 mg/dL — ABNORMAL HIGH (ref 70–99)

## 2014-04-14 LAB — BASIC METABOLIC PANEL
ANION GAP: 11 (ref 5–15)
BUN: 83 mg/dL — ABNORMAL HIGH (ref 6–23)
CHLORIDE: 104 mmol/L (ref 96–112)
CO2: 24 mmol/L (ref 19–32)
Calcium: 8.6 mg/dL (ref 8.4–10.5)
Creatinine, Ser: 3.12 mg/dL — ABNORMAL HIGH (ref 0.50–1.10)
GFR calc Af Amer: 20 mL/min — ABNORMAL LOW (ref >90)
GFR calc non Af Amer: 17 mL/min — ABNORMAL LOW (ref >90)
GLUCOSE: 108 mg/dL — AB (ref 70–99)
POTASSIUM: 4.3 mmol/L (ref 3.5–5.1)
SODIUM: 139 mmol/L (ref 135–145)

## 2014-04-14 LAB — CBC
HCT: 23.5 % — ABNORMAL LOW (ref 36.0–46.0)
HEMOGLOBIN: 7.3 g/dL — AB (ref 12.0–15.0)
MCH: 27.9 pg (ref 26.0–34.0)
MCHC: 31.1 g/dL (ref 30.0–36.0)
MCV: 89.7 fL (ref 78.0–100.0)
Platelets: 309 10*3/uL (ref 150–400)
RBC: 2.62 MIL/uL — AB (ref 3.87–5.11)
RDW: 14.6 % (ref 11.5–15.5)
WBC: 8.4 10*3/uL (ref 4.0–10.5)

## 2014-04-14 LAB — OCCULT BLOOD X 1 CARD TO LAB, STOOL: Fecal Occult Bld: NEGATIVE

## 2014-04-14 LAB — PREPARE RBC (CROSSMATCH)

## 2014-04-14 MED ORDER — SODIUM CHLORIDE 0.9 % IV SOLN
Freq: Once | INTRAVENOUS | Status: AC
  Administered 2014-04-14: 18:00:00 via INTRAVENOUS

## 2014-04-14 MED ORDER — TORSEMIDE 20 MG PO TABS
40.0000 mg | ORAL_TABLET | Freq: Every day | ORAL | Status: DC
  Administered 2014-04-14: 40 mg via ORAL
  Filled 2014-04-14 (×2): qty 2

## 2014-04-14 MED ORDER — TORSEMIDE 20 MG PO TABS
80.0000 mg | ORAL_TABLET | Freq: Every day | ORAL | Status: DC
  Administered 2014-04-14: 80 mg via ORAL
  Filled 2014-04-14 (×2): qty 4

## 2014-04-14 NOTE — Progress Notes (Signed)
Patient ID: Jasmine [name], female   DOB: February 08, 1970, 44 y.o.   MRN: [id number]     Subjective:    44 y.o [place] female with history of morbid obesity, nonobstructive CAD per cath in 2010, HTN, OSA, uncontrolled DM type 1, class III CKD and diastolic HF, presenting with dyspnea and weight gain in the setting of acute on chronic diastolic CHF.   She feels better today compared to admission, off oxygen now.  I/Os -1600 in the last 24 hours.  Creatinine stable, hemoglobin down again, iron studies - low normal.   Troponin 0.25>0.32>0.28  Creatinine 2.72 > 3.16>3.15>3.12  Objective:   Weight Range:  Vital Signs:   Temp:  [97.5 F (36.4 C)] 97.5 F (36.4 C) (03/20 0517) Pulse Rate:  [75-77] 75 (03/20 1000) Resp:  [18] 18 (03/20 0517) BP: (129-141)/(59-62) 141/62 mmHg (03/20 1000) SpO2:  [94 %] 94 % (03/20 0517) Weight:  [289 lb 3.2 oz (131.18 kg)] 289 lb 3.2 oz (131.18 kg) (03/20 0517) Last BM Date: 04/12/14  Weight change: Filed Weights   04/12/14 0539 04/13/14 0527 04/14/14 0517  Weight: 292 lb 3.2 oz (132.541 kg) 291 lb 11.2 oz (132.314 kg) 289 lb 3.2 oz (131.18 kg)   Intake/Output:   Intake/Output Summary (Last 24 hours) at 04/14/14 1137 Last data filed at 04/14/14 1131  Gross per 24 hour  Intake    755 ml  Output   2400 ml  Net  -1645 ml    General: Pleasant, NAD, morbidly obese Psych: Normal affect. Neuro: Alert and oriented X 3. Moves all extremities spontaneously. HEENT: Normal Neck: Supple without bruits. JVP 10 cm Lungs: Resp regular and unlabored, faint rales over RLL Heart: RRR no s3, s4, or 2/6 systolic murmur. Abdomen: obese, Soft, non-tender, non-distended, BS + x 4.  Extremities: No clubbing, cyanosis,Trace ankle edema  Telemetry: SR 90s   Labs: Basic Metabolic Panel:  Recent Labs Lab 04/10/14 1040 04/11/14 0806 04/12/14 0415 04/13/14 0310 04/14/14 0722  NA 133* 138 138 135 139  K 3.2* 3.8 4.0 4.6 4.3  CL 96 103 103 102  104  CO2 24 23 25 21 24   GLUCOSE 447* 158* 101* 186* 108*  BUN 78* 82* 88* 89* 83*  CREATININE 2.72* 2.98* 3.16* 3.15* 3.12*  CALCIUM 8.4 8.6 8.6 8.5 8.6   CBC:  Recent Labs Lab 04/11/14 0806 04/12/14 0415 04/12/14 1009 04/13/14 0615 04/14/14 0722  WBC 9.7 11.2* 11.0* 8.5 8.4  HGB 9.0* 7.5* 8.3* 7.7* 7.3*  HCT 27.9* 23.1* 25.2* 24.4* 23.5*  MCV 88.6 88.8 89.0 89.1 89.7  PLT 342 358 PLATELET CLUMPS NOTED ON SMEAR, UNABLE TO ESTIMATE 323 309    Cardiac Enzymes:  Recent Labs Lab 04/10/14 1040 04/10/14 1855 04/11/14 0134 04/11/14 0806  TROPONINI 0.25* 0.32* 0.28* 0.22*    BNP: BNP (last 3 results)  Recent Labs  04/10/14 1040  BNP 331.1*    ProBNP (last 3 results)  Recent Labs  05/25/13 0545 12/03/13 0305  PROBNP 3258.0* 2451.0*   Other results:  EKG:   Imaging: No results found.   Medications:     Scheduled Medications: . amLODipine  10 mg Oral Daily  . aspirin  81 mg Oral Daily  . atorvastatin  80 mg Oral q1800  . carvedilol  12.5 mg Oral BID WC  . Chlorhexidine Gluconate Cloth  6 each Topical Q0600  . clopidogrel  75 mg Oral Q breakfast  . DULoxetine  60 mg Oral Daily  . ferrous sulfate  325 mg Oral BID  . heparin subcutaneous  5,000 Units Subcutaneous 3 times per day  . insulin aspart  0-20 Units Subcutaneous TID WC  . insulin glargine  60 Units Subcutaneous QHS  . metoCLOPramide  10 mg Oral TID AC  . mupirocin ointment  1 application Nasal BID  . nitroGLYCERIN  1 inch Topical 4 times per day  . potassium chloride SA  40 mEq Oral BID  . pregabalin  75 mg Oral BID   PRN Medications: acetaminophen, albuterol, nitroGLYCERIN   Assessment/Plan    1. Acute on chronic diastolic CHF: Has history of diastolic CHF, admitted with acute exacerbation requiring Bipap. I/Os not well-recorded but weight not down.  However, she feels much better and is off oxygen.  Creatinine up. Torsemide was held the last nigh, still diuresed 1.6 L in the last 24  hours.    We will restart especially that we will start blood transfusions.  Weight today 289, baseline 280-285 lbs. - Ambulate halls today.   2. CKD: Creatinine up. Unchanged from yesterday, restart .    3. OSA: Severe, uses CPAP at home.   4. Elevated troponin: Mild elevation. Patient had chest pain briefly prior to admission. History of nonobstructive CAD on cath. Suspect this is most likely demand ischemia with CHF/volume overload and hypoxemia. Troponin with flat trend.  Think we can stop heparin gtt.  Continue Plavix, atorvastatin, and Coreg. Would avoid cardiac cath if at all possible given CKD. No further chest pain.   5. Anemia: Baseline low hemoglobin attributed to CKD.  Down to 7.3 today.  MCV nomal. No BRBPR or melena.  She is on iron.  We will give 2 units of RBC followed by extra torsemide.  Iron stores low normal, no IV iron for now.   Check stool for occult blood.  Length of Stay: 4  Tobias Alexander H,MD 04/14/2014, 11:37 AM

## 2014-04-14 NOTE — Progress Notes (Signed)
CBG rechecked at 0550 and 60, 4 ounces apple juice and graham crackers given, patient states symptoms have now subsided, will recheck CBG in 15 minutes

## 2014-04-14 NOTE — Progress Notes (Signed)
Repeat CBG 99, patient resting comfortably at this time with no complaints, no furthur intervention needed, will continue to monitor closely.

## 2014-04-14 NOTE — Progress Notes (Signed)
Patient stated she felt a bit nauseous and dizzy at 0535, CBG taken and 54, 4 ounces orange juice given, will recheck CBG in 15 minutes and follow hypoglycemia protocol accordingly.

## 2014-04-14 NOTE — Progress Notes (Signed)
Patient places herself on and off of CPAP before sleep.

## 2014-04-15 ENCOUNTER — Encounter (HOSPITAL_COMMUNITY): Payer: Self-pay | Admitting: Physician Assistant

## 2014-04-15 DIAGNOSIS — I350 Nonrheumatic aortic (valve) stenosis: Secondary | ICD-10-CM

## 2014-04-15 DIAGNOSIS — Z9989 Dependence on other enabling machines and devices: Secondary | ICD-10-CM

## 2014-04-15 DIAGNOSIS — E785 Hyperlipidemia, unspecified: Secondary | ICD-10-CM | POA: Diagnosis present

## 2014-04-15 DIAGNOSIS — E109 Type 1 diabetes mellitus without complications: Secondary | ICD-10-CM | POA: Diagnosis present

## 2014-04-15 DIAGNOSIS — I10 Essential (primary) hypertension: Secondary | ICD-10-CM | POA: Diagnosis present

## 2014-04-15 DIAGNOSIS — I5033 Acute on chronic diastolic (congestive) heart failure: Secondary | ICD-10-CM

## 2014-04-15 DIAGNOSIS — G4733 Obstructive sleep apnea (adult) (pediatric): Secondary | ICD-10-CM | POA: Diagnosis present

## 2014-04-15 DIAGNOSIS — I2510 Atherosclerotic heart disease of native coronary artery without angina pectoris: Secondary | ICD-10-CM | POA: Diagnosis present

## 2014-04-15 LAB — GLUCOSE, CAPILLARY
GLUCOSE-CAPILLARY: 94 mg/dL (ref 70–99)
Glucose-Capillary: 62 mg/dL — ABNORMAL LOW (ref 70–99)
Glucose-Capillary: 67 mg/dL — ABNORMAL LOW (ref 70–99)
Glucose-Capillary: 89 mg/dL (ref 70–99)

## 2014-04-15 LAB — BASIC METABOLIC PANEL
ANION GAP: 12 (ref 5–15)
BUN: 80 mg/dL — ABNORMAL HIGH (ref 6–23)
CHLORIDE: 109 mmol/L (ref 96–112)
CO2: 20 mmol/L (ref 19–32)
Calcium: 8.9 mg/dL (ref 8.4–10.5)
Creatinine, Ser: 2.91 mg/dL — ABNORMAL HIGH (ref 0.50–1.10)
GFR calc Af Amer: 22 mL/min — ABNORMAL LOW (ref >90)
GFR calc non Af Amer: 19 mL/min — ABNORMAL LOW (ref >90)
GLUCOSE: 71 mg/dL (ref 70–99)
Potassium: 4.4 mmol/L (ref 3.5–5.1)
Sodium: 141 mmol/L (ref 135–145)

## 2014-04-15 LAB — CBC
HEMATOCRIT: 29 % — AB (ref 36.0–46.0)
Hemoglobin: 9.6 g/dL — ABNORMAL LOW (ref 12.0–15.0)
MCH: 29.4 pg (ref 26.0–34.0)
MCHC: 33.1 g/dL (ref 30.0–36.0)
MCV: 89 fL (ref 78.0–100.0)
Platelets: 335 10*3/uL (ref 150–400)
RBC: 3.26 MIL/uL — ABNORMAL LOW (ref 3.87–5.11)
RDW: 14.6 % (ref 11.5–15.5)
WBC: 7.8 10*3/uL (ref 4.0–10.5)

## 2014-04-15 LAB — FOLATE RBC
Folate, Hemolysate: >620 ng/mL
Folate, RBC: >2500 ng/mL (ref >498)
Hematocrit: 24.8 % — ABNORMAL LOW (ref 34.0–46.6)

## 2014-04-15 MED ORDER — TORSEMIDE 20 MG PO TABS: 80.0000 mg | ORAL_TABLET | Freq: Two times a day (BID) | ORAL | Status: DC

## 2014-04-15 MED ORDER — NITROGLYCERIN 0.4 MG SL SUBL: 0.4000 mg | SUBLINGUAL_TABLET | SUBLINGUAL | Status: DC | PRN

## 2014-04-15 MED ORDER — TORSEMIDE 20 MG PO TABS
80.0000 mg | ORAL_TABLET | Freq: Two times a day (BID) | ORAL | Status: DC
Start: 2014-04-15 — End: 2014-04-15
  Administered 2014-04-15: 80 mg via ORAL
  Filled 2014-04-15 (×3): qty 4

## 2014-04-15 MED ORDER — CARVEDILOL 6.25 MG PO TABS: ORAL_TABLET | ORAL | Status: DC

## 2014-04-15 NOTE — Progress Notes (Signed)
Patient ID: Jasmine [name], female   DOB: 12/03/1970, 44 y.o.   MRN: [id number] Advanced Heart Failure Rounding Note   Subjective:    44 y.o [place] female with history of morbid obesity, nonobstructive CAD per cath in 2010, HTN, OSA, uncontrolled DM type 1, class III CKD and diastolic HF, presenting with dyspnea and weight gain in the setting of acute on chronic diastolic CHF.   Yesterday she received 2UPRBCs for hemoglobin 7.3. Feeling better today. Denies SOB/CP.  Troponin 0.25>0.32>0.28  Creatinine 2.72 > 3.16 > 2.9.  Hgb 7.3> 9.6   Objective:   Weight Range:  Vital Signs:   Temp:  [98.1 F (36.7 C)-98.6 F (37 C)] 98.1 F (36.7 C) (03/21 0636) Pulse Rate:  [74-78] 75 (03/21 0313) Resp:  [16-18] 18 (03/21 0636) BP: (130-152)/(62-98) 152/98 mmHg (03/21 0636) SpO2:  [98 %-100 %] 100 % (03/21 0636) Last BM Date: 04/14/14  Weight change: Filed Weights   04/12/14 0539 04/13/14 0527 04/14/14 0517  Weight: 292 lb 3.2 oz (132.541 kg) 291 lb 11.2 oz (132.314 kg) 289 lb 3.2 oz (131.18 kg)    Intake/Output:   Intake/Output Summary (Last 24 hours) at 04/15/14 0807 Last data filed at 04/15/14 1478  Gross per 24 hour  Intake   2030 ml  Output   2151 ml  Net   -121 ml    General: Pleasant, NAD, morbidly obese Psych: Normal affect. Neuro: Alert and oriented X 3. Moves all extremities spontaneously. HEENT: Normal Neck: Supple without bruits. JVP 9 cm Lungs: Resp regular and unlabored, decreased in the bases.  Heart: RRR no s3, s4, or murmurs. Abdomen: obese, Soft, non-tender, non-distended, BS + x 4.  Extremities: No clubbing, cyanosis,Trace ankle edema  Telemetry: SR 90s   Labs: Basic Metabolic Panel:  Recent Labs Lab 04/10/14 1040 04/11/14 0806 04/12/14 0415 04/13/14 0310 04/14/14 0722  NA 133* 138 138 135 139  K 3.2* 3.8 4.0 4.6 4.3  CL 96 103 103 102 104  CO2 GLUCOSE 447* 158* 101* 186* 108*  BUN 78* 82* 88* 89* 83*   CREATININE 2.72* 2.98* 3.16* 3.15* 3.12*  CALCIUM 8.4 8.6 8.6 8.5 8.6    Liver Function Tests: No results for input(s): AST, ALT, ALKPHOS, BILITOT, PROT, ALBUMIN in the last 168 hours. No results for input(s): LIPASE, AMYLASE in the last 168 hours. No results for input(s): AMMONIA in the last 168 hours.  CBC:  Recent Labs Lab 04/12/14 0415 04/12/14 1009 04/13/14 0615 04/14/14 0722 04/15/14 0525  WBC 11.2* 11.0* 8.5 8.4 7.8  HGB 7.5* 8.3* 7.7* 7.3* 9.6*  HCT 23.1* 25.2* 24.4* 23.5* 29.0*  MCV 88.8 89.0 89.1 89.7 89.0  PLT 358 PLATELET CLUMPS NOTED ON SMEAR, UNABLE TO ESTIMATE 323 309 335    Cardiac Enzymes:  Recent Labs Lab 04/10/14 1040 04/10/14 1855 04/11/14 0134 04/11/14 0806  TROPONINI 0.25* 0.32* 0.28* 0.22*    BNP: BNP (last 3 results)  Recent Labs  04/10/14 1040  BNP 331.1*    ProBNP (last 3 results)  Recent Labs  05/25/13 0545 12/03/13 0305  PROBNP 3258.0* 2451.0*      Other results:  EKG:   Imaging: No results found.   Medications:     Scheduled Medications: . amLODipine  10 mg Oral Daily  . aspirin  81 mg Oral Daily  . atorvastatin  80 mg Oral q1800  . carvedilol  12.5 mg Oral BID WC  . clopidogrel  75 mg Oral Q  breakfast  . DULoxetine  60 mg Oral Daily  . ferrous sulfate  325 mg Oral BID  . insulin aspart  0-20 Units Subcutaneous TID WC  . insulin glargine  60 Units Subcutaneous QHS  . metoCLOPramide  10 mg Oral TID AC  . mupirocin ointment  1 application Nasal BID  . nitroGLYCERIN  1 inch Topical 4 times per day  . potassium chloride SA  40 mEq Oral BID  . pregabalin  75 mg Oral BID  . torsemide  40 mg Oral q1800  . torsemide  80 mg Oral Daily    Infusions:    PRN Medications: acetaminophen, albuterol, nitroGLYCERIN   Assessment/Plan    1. Acute on chronic diastolic CHF: Has history of diastolic CHF, admitted with acute exacerbation requiring Bipap. I/Os  - Volume status ok. Continue torsemide 80 mg bid.  BMET pending.  2. CKD: BMET pending. On  po diuretics.  3. OSA: Severe, uses CPAP at home.  4. Elevated troponin: Mild elevation. Patient had chest pain briefly prior to admission. History of nonobstructive CAD on cath. Suspect this is most likely demand ischemia with CHF/volume overload and hypoxemia. Troponin with flat trend.  Continue Plavix, atorvastatin, and Coreg. Would avoid cardiac cath if at all possible given CKD. No further chest pain.  5. Anemia: Baseline low hemoglobin attributed to CKD.   S/P 2 UPRBCs >Hgb 9.6 .  No BRBPR or melena.  She is on iron.    Possible D/C later today.  Length of Stay: 5  CLEGG,AMY NP-C  04/15/2014, 8:07 AM  Patient seen with NP, agree with the above note.    Creatinine down to 2.9.  Would send home today, she will continue torsemide 80 mg bid.  Hemoglobin better with transfusion.  No evidence for overt bleeding, has anemia of renal disease.   Needs followup CHF clinic in 1 week with BMET.  Needs followup with nephrology.  Home meds: Torsemide 80 mg bid, KDur 40 bid, amlodipine 10 daily, ASA 81 daily, Plavix 75 daily, Coreg 12.5 bid, noncardiac meds as prior to admission.   Marca AnconaDalton Devlin Brink 04/15/2014 8:53 AM

## 2014-04-15 NOTE — Progress Notes (Signed)
DC IV, DC Tele, DC Home. Discharge instructions and home medications discussed with patient. Patient denied any questions or concerns at this time. Patient leaving unit via wheelchair and appears in no acute distress.  

## 2014-04-15 NOTE — Discharge Summary (Signed)
Discharge Summary   Patient ID: Jasmine [name] MRN: [id number], DOB/AGE: 44-14-1972 44 y.o. Admit date: 04/10/2014 D/C date:     04/15/2014  Primary Cardiologist: Dr. Haroldine Laws  Principal Problem:   Acute on chronic diastolic congestive heart failure Active Problems:   Chronic diastolic congestive heart failure   OSA (obstructive sleep apnea)   Anemia, chronic disease   CKD (chronic kidney disease), stage IV   Gastroparesis   Morbid obesity   Diabetes mellitus type 1   OSA on CPAP   Hyperlipidemia   Aortic stenosis, mild   Hypertension   Coronary artery disease    Admission Dates: 04/10/14- 04/15/14 Discharge Diagnosis: Acute on chronic diastolic CHF. Discharge weight 289 lbs.   HPI: Jasmine [name] is a 44 y.o. female with a history of morbid obesity, nonobstructive CAD per cath in 2010, HTN, OSA, uncontrolled DM type 1, class III CKD and diastolic HF, presenting with dyspnea and weight gain in the setting of acute on chronic diastolic CHF.   2D echo 05/27/13 demonstrated normal LVF systolic function with EF of 55-60%. She is followed in the [facility] by Dr. Haroldine Laws. She is prescribed 80 mg of Torsemide BID and PRN Metolazone, along with additional medications for HTN and diabetes. She notes that she was recently started on Lyrica for diabetic neuropathy on 04/03/14.   She presented to the [facility] ED on 04/10/14 with complaints of progressive weight gain and acute dyspnea that started suddenly the night prior. She reported noticing her progressive weight gain over the previous 7 days ( at least 10 lbs.) She reported full medication compliance but admited to dietary indiscretion (can soup). 3 days prior, she added PRN metolazone to her diuretic regimen but noticed no improvement.  She also noted some substernal chest pressure radiating to her left arm which has happened in the past with her prior acute CHF exacerbations.   On arrival to the ED, she was noted to be tachypnic with  accessory muscle use. Rales were noted on physical exam. She was given IV Lasix and placed on nonrebreather. CXR w/ indistinct bilateral pulmonary opacity. Favor acute pulmonary edema over bilateral pneumonia. BNP elevated at 331.1. Initial troponin mildly elevated at 0.25. Hgb is 9.1. Scr is 2.72 (baseline ~2.5-3.00). K  3.2. BG 447. EKG w/ sinus tach with HR of 106 and nonspecific ST abnormalities. No significant changes compared to prior EKG.    Hospital Course  1. Acute on chronic diastolic CHF: Has history of diastolic CHF, admitted with acute exacerbation requiring Bipap. -- Net neg 5.1L. Weight down 3 lbs from 04/12/14 (292--> 289lbs). Discharge weight 289 lbs. -- Repeat 2D ECHO on 04/11/2014 w/ EF: 55- 60%, mild LVH. No RWMA, G2DD, mild AS, MIld MR, mild LAE -- Will go home on Torsemide 80 mg bid and KDur 40 bid. We will continue her PRN 2.$RemoveB'5mg'NMCMlreI$  Zaroxolyn and she knows to call the CHF clinic if she needs to use this  2. CKD: Creatinine 2.72 > 3.16 > 2.9.   3. OSA: Severe, uses CPAP at home.   4. Elevated troponin: Mild elevation.Troponin 0.25>0.32>0.28.  Patient had chest pain briefly prior to admission. History of nonobstructive CAD on cath. Suspect this is most likely demand ischemia with CHF/volume overload and hypoxemia. Troponin with flat trend.  -- Continue Plavix, atorvastatin, and Coreg.Would avoid cardiac cath if at all possible given CKD. No further chest pain.   5. Anemia: Baseline low hemoglobin attributed to CKD.S/P 2 UPRBCs for hemoglobin of 7.3.  Hg now improved to Hgb 9.6 .Feeling better today. Denies SOB/CP. -- No BRBPR or melena.Continue iron.   6. Mild AS : Valve area (VTI): 1.79 cm^2. Valve area (Vmax): 1.49 cm^2. Valve area (Vmean): 1.89 cm^2  The patient has had an uncomplicated hospital course and is recovering well. She has been seen by Dr. Aundra Dubin today and deemed ready for discharge home. All follow-up appointments have been scheduled. A work note was  provided as well. Discharge medications are listed below. Will go home on Torsemide 80 mg bid, KDur 40 bid, amlodipine 10 daily, ASA 81 daily, Plavix 75 daily, Coreg 12.5 bid   Discharge Vitals: Blood pressure 156/70, pulse 78, temperature 98.1 F (36.7 C), temperature source Oral, resp. rate 18, height $RemoveBe'5\' 7"'mMHfUbdSC$  (1.702 m), weight 289 lb 3.2 oz (131.18 kg), SpO2 100 %.  Labs: Lab Results  Component Value Date   WBC 7.8 04/15/2014   HGB 9.6* 04/15/2014   HCT 29.0* 04/15/2014   MCV 89.0 04/15/2014   PLT 335 04/15/2014     Recent Labs Lab 04/15/14 0525  NA 141  K 4.4  CL 109  CO2 20  BUN 80*  CREATININE 2.91*  CALCIUM 8.9  GLUCOSE 71      Diagnostic Studies/Procedures   Dg Chest Port 1 View  04/10/2014   CLINICAL DATA:  44 year old female with shortness of breath and abnormal pulmonary auscultation. Initial encounter.  EXAM: PORTABLE CHEST - 1 VIEW  COMPARISON:  12/03/2013 and earlier.  FINDINGS: Portable AP semi upright view at 1029 hours. Stable cardiac size and mediastinal contours. Mildly improved lung volumes. Basilar predominant but diffuse indistinct pulmonary opacity. This is less asymmetric than on the comparison. No pneumothorax, consolidation or pleural effusion identified.  IMPRESSION: Indistinct bilateral pulmonary opacity. Favor acute pulmonary edema over bilateral pneumonia.   Electronically Signed   By: Genevie Ann M.D.   On: 04/10/2014 10:53    Study Date: 04/11/2014 LV EF: 55% -   60% Study Conclusions - Left ventricle: The cavity size was normal. Wall thickness was   increased in a pattern of mild LVH. Systolic function was normal.   The estimated ejection fraction was in the range of 55% to 60%.   Wall motion was normal; there were no regional wall motion   abnormalities. Features are consistent with a pseudonormal left   ventricular filling pattern, with concomitant abnormal relaxation   and increased filling pressure (grade 2 diastolic dysfunction).   Doppler  parameters are consistent with high ventricular filling   pressure. - Aortic valve: There was mild stenosis. Valve area (VTI): 1.79   cm^2. Valve area (Vmax): 1.49 cm^2. Valve area (Vmean): 1.89   cm^2. - Mitral valve: Calcified annulus. There was mild regurgitation. - Left atrium: The atrium was mildly dilated. Impressions: - Normal LV function; mild LVH; mild AS; mild MR; mild LAE.     Discharge Medications     Medication List    TAKE these medications        acetaminophen 500 MG tablet  Commonly known as:  TYLENOL  Take 500 mg by mouth every 6 (six) hours as needed for mild pain.     albuterol 108 (90 BASE) MCG/ACT inhaler  Commonly known as:  PROVENTIL HFA;VENTOLIN HFA  Inhale 2 puffs into the lungs every 6 (six) hours as needed for wheezing.     amLODipine 10 MG tablet  Commonly known as:  NORVASC  Take 10 mg by mouth daily.     aspirin 81 MG  tablet  Take 81 mg by mouth daily. For pain     atorvastatin 80 MG tablet  Commonly known as:  LIPITOR  Take 1 tablet (80 mg total) by mouth daily at 6 PM.     capsicum 0.075 % topical cream  Commonly known as:  ZOSTRIX  Apply 1 application topically 3 (three) times daily. Apply to feet up to 3x daily     carvedilol 6.25 MG tablet  Commonly known as:  COREG  TAKE 2 TABLETS (12.5 MG TOTAL) BY MOUTH 2 (TWO) TIMES DAILY WITH A MEAL.     clopidogrel 75 MG tablet  Commonly known as:  PLAVIX  Take 1 tablet (75 mg total) by mouth daily with breakfast.     DULoxetine 60 MG capsule  Commonly known as:  CYMBALTA  Take 1 capsule (60 mg total) by mouth daily.     ferrous sulfate 325 (65 FE) MG tablet  Take 325 mg by mouth 2 (two) times daily.     glucose blood test strip  Check glucose TID and prn.     HYDROcodone-acetaminophen 5-325 MG per tablet  Commonly known as:  NORCO  Take 1 tablet by mouth every 6 (six) hours as needed for moderate pain.     insulin glargine 100 UNIT/ML injection  Commonly known as:  LANTUS    Inject 60 Units into the skin at bedtime.     insulin lispro 100 UNIT/ML injection  Commonly known as:  HUMALOG  Inject 15 Units into the skin 3 (three) times daily before meals.     l-methylfolate-B6-B12 3-35-2 MG Tabs  Commonly known as:  METANX  Take 1 tablet by mouth 2 (two) times daily.     metoCLOPramide 10 MG tablet  Commonly known as:  REGLAN  Take 10 mg by mouth 3 (three) times daily before meals. One po qac     metolazone 2.5 MG tablet  Commonly known as:  ZAROXOLYN  TAKE 1 TABLET (2.5 MG TOTAL) BY MOUTH if weight goes up 2 lbs in 24 hrs or 3 lbs in a week.     nitroGLYCERIN 0.4 MG SL tablet  Commonly known as:  NITROSTAT  Place 1 tablet (0.4 mg total) under the tongue every 5 (five) minutes as needed for chest pain.     ONE TOUCH ULTRA 2 W/DEVICE Kit  Check CBG's TID and prn     potassium chloride SA 20 MEQ tablet  Commonly known as:  K-DUR,KLOR-CON  Take 2 tablets (40 mEq total) by mouth 2 (two) times daily.     pregabalin 75 MG capsule  Commonly known as:  LYRICA  Take 1 capsule (75 mg total) by mouth 2 (two) times daily.     prenatal multivitamin Tabs tablet  Take 1 tablet by mouth daily at 12 noon.     torsemide 20 MG tablet  Commonly known as:  DEMADEX  Take 4 tablets (80 mg total) by mouth 2 (two) times daily.        Disposition   The patient will be discharged in stable condition to home.  Follow-up Information    Follow up with Glori Bickers, MD On 04/22/2014.   Specialty:  Cardiology   Why:  @ [place] [address] information:   [address] [place] [address] [phone]         Duration of Discharge Encounter: Greater than 30 minutes including physician and PA time.  Signed, Angelena Form R PA-C  04/15/2014, 11:42 AM

## 2014-04-16 LAB — TYPE AND SCREEN
ABO/RH(D): A POS
Antibody Screen: NEGATIVE
Unit division: 0
Unit division: 0

## 2014-04-22 ENCOUNTER — Ambulatory Visit (HOSPITAL_COMMUNITY)
Admission: RE | Admit: 2014-04-22 | Discharge: 2014-04-22 | Disposition: A | Discharge status: Home / Self Care | Payer: 59 | Source: Ambulatory Visit | Attending: Internal Medicine | Admitting: Internal Medicine

## 2014-04-22 VITALS — BP 132/60 | HR 73 | Wt 302.1 lb

## 2014-04-22 DIAGNOSIS — E1021 Type 1 diabetes mellitus with diabetic nephropathy: Secondary | ICD-10-CM | POA: Diagnosis not present

## 2014-04-22 DIAGNOSIS — N184 Chronic kidney disease, stage 4 (severe): Secondary | ICD-10-CM | POA: Diagnosis not present

## 2014-04-22 DIAGNOSIS — G4733 Obstructive sleep apnea (adult) (pediatric): Secondary | ICD-10-CM | POA: Diagnosis not present

## 2014-04-22 DIAGNOSIS — I2510 Atherosclerotic heart disease of native coronary artery without angina pectoris: Secondary | ICD-10-CM | POA: Diagnosis not present

## 2014-04-22 DIAGNOSIS — E1043 Type 1 diabetes mellitus with diabetic autonomic (poly)neuropathy: Secondary | ICD-10-CM | POA: Diagnosis not present

## 2014-04-22 DIAGNOSIS — G4730 Sleep apnea, unspecified: Secondary | ICD-10-CM | POA: Insufficient documentation

## 2014-04-22 DIAGNOSIS — E785 Hyperlipidemia, unspecified: Secondary | ICD-10-CM | POA: Diagnosis not present

## 2014-04-22 DIAGNOSIS — I129 Hypertensive chronic kidney disease with stage 1 through stage 4 chronic kidney disease, or unspecified chronic kidney disease: Secondary | ICD-10-CM | POA: Insufficient documentation

## 2014-04-22 DIAGNOSIS — K3184 Gastroparesis: Secondary | ICD-10-CM | POA: Diagnosis not present

## 2014-04-22 DIAGNOSIS — I119 Hypertensive heart disease without heart failure: Secondary | ICD-10-CM | POA: Insufficient documentation

## 2014-04-22 DIAGNOSIS — I159 Secondary hypertension, unspecified: Secondary | ICD-10-CM

## 2014-04-22 DIAGNOSIS — Z7902 Long term (current) use of antithrombotics/antiplatelets: Secondary | ICD-10-CM | POA: Insufficient documentation

## 2014-04-22 DIAGNOSIS — Z7982 Long term (current) use of aspirin: Secondary | ICD-10-CM | POA: Diagnosis not present

## 2014-04-22 DIAGNOSIS — N183 Chronic kidney disease, stage 3 (moderate): Secondary | ICD-10-CM | POA: Diagnosis not present

## 2014-04-22 DIAGNOSIS — F319 Bipolar disorder, unspecified: Secondary | ICD-10-CM | POA: Diagnosis not present

## 2014-04-22 DIAGNOSIS — I5033 Acute on chronic diastolic (congestive) heart failure: Secondary | ICD-10-CM | POA: Insufficient documentation

## 2014-04-22 DIAGNOSIS — Z794 Long term (current) use of insulin: Secondary | ICD-10-CM | POA: Diagnosis not present

## 2014-04-22 DIAGNOSIS — Z79899 Other long term (current) drug therapy: Secondary | ICD-10-CM | POA: Diagnosis not present

## 2014-04-22 LAB — BASIC METABOLIC PANEL
Anion gap: 11 (ref 5–15)
BUN: 68 mg/dL — ABNORMAL HIGH (ref 6–23)
CO2: 24 mmol/L (ref 19–32)
Calcium: 8.6 mg/dL (ref 8.4–10.5)
Chloride: 103 mmol/L (ref 96–112)
Creatinine, Ser: 2.32 mg/dL — ABNORMAL HIGH (ref 0.50–1.10)
GFR calc Af Amer: 28 mL/min — ABNORMAL LOW (ref >90)
GFR calc non Af Amer: 25 mL/min — ABNORMAL LOW (ref >90)
Glucose, Bld: 119 mg/dL — ABNORMAL HIGH (ref 70–99)
POTASSIUM: 3.8 mmol/L (ref 3.5–5.1)
SODIUM: 138 mmol/L (ref 135–145)

## 2014-04-22 LAB — BRAIN NATRIURETIC PEPTIDE: B NATRIURETIC PEPTIDE 5: 244.5 pg/mL — AB (ref 0.0–100.0)

## 2014-04-22 MED ORDER — METOLAZONE 2.5 MG PO TABS: 2.5000 mg | ORAL_TABLET | ORAL | Status: DC

## 2014-04-22 MED ORDER — POTASSIUM CHLORIDE CRYS ER 20 MEQ PO TBCR: 40.0000 meq | EXTENDED_RELEASE_TABLET | Freq: Two times a day (BID) | ORAL | Status: DC

## 2014-04-22 NOTE — Patient Instructions (Signed)
Labs today  Take 2.5 mg of Metolazone today and tomorrow, then take one tab every Monday Take 60 MeQ Twice a day for today and tomorrow, then take 60 meQ BID every Monday with Metolazone  Your physician recommends that you schedule a follow-up appointment in: 1 week  Do the following things EVERYDAY: 1) Weigh yourself in the morning before breakfast. Write it down and keep it in a log. 2) Take your medicines as prescribed 3) Eat low salt foods-Limit salt (sodium) to 2000 mg per day.  4) Stay as active as you can everyday 5) Limit all fluids for the day to less than 2 liters 6)

## 2014-04-22 NOTE — Progress Notes (Signed)
Patient ID: Jasmine [name], female   DOB: 26-Apr-1970, 44 y.o.   MRN: [id number]  PCP: Dr. Dalphine Handing Nephrology: Dr Arty Baumgartner Pulmonary: Dr. Elsworth Soho  HPI: [name] is a 44 y.o African American female with history of morbid obesity, nonobstructive CAD per cath 2010, HTN, OSA, uncontrolled DM type 1, class III CKD and diastolic HF.    Admitted 05/25/13-05/30/13 for L shoulder pain, diabetic ketoacidosis and NSTEMI. Had nuclear test showing fixed anteroapical defect and no ischemia. VQ scan was negative. Started on Plavix.   Admitted 3/16 through 04/15/14 with volume overload. Diuresed with IV lasix and transitioned back to torsemide 80 mg twice a day. Discharge weight was 289 pounds.   She returns for post hospsital follow up. Discharged last week after being diuresed with IV lasix. Discharge weight was 289 pounds. Since discharge she has been eating soup every other day.  Mild dyspnea walking to clinic. Denies SOB/PND/Orthopnea. Not weighing daily.  Trying to limit portions. Using CPAP nightly. Continues to work full time at [employer].   - V/Q scan on November 02, 2010, showing normal ventilation perfusion without evidence of PE.  - CT of the chest without contrast 10/2010 showing suspected multifocal alveolar edema in the bilateral upper lobes and superior left lower lobe. Multifocal infection considered less likely. Cardiomegaly with bilateral pleural effusions. No findings to suggest interstitial lung disease.  - 11/2010 CPX Peak VO2: 10.79ml/kg/min % predicted peak VO2: 57.2% (corrects to 19.6 for ideal body weight), VE/VCO2 slope: 52 (to peak exercise) 43 (to RCP), OUES: 1.08, Peak RER: 1.19, Ventilatory Threshold: 7.2 % predicted peak VO2: 37.6%, O2pulse: 12 % predicted O2pulse: 90% - Sleep study completed 11/25/2010. Dr Elsworth Soho evaluated severe sleep apnea, placed on CPAP.  ECHOs (09/2012): EF 55-60% with grade II diastolic dysfx ECHOs (01/8982): EF 55-60% with grade II DD  Labs     10/25/12: K+ 4.8, Cr  3.06 (ramipril stopped)     11/01/12: K+ 5.0  Cr 1.8 (Baseline 2.3)     08/08/13 K 4.1 Creatinine 1.8   SH: Works FT at post office. Lives with boyfriend in [place]. Occassional ETOH. No tobacco abuse or drug abuse.   FH: Mother deceased: CAD, HTN, DM2, TIAs        Father living: Alzheimers, HIV   ROS: All systems negative except as listed in HPI, PMH and Problem List.  Past Medical History  Diagnosis Date  . Diastolic heart failure     a. 2D ECHO on 04/11/2014 w/ EF: 55- 60%, mild LVH. No RWMA, G2DD, mild AS, MIld MR, mild LAE  . Hypertensive heart disease   . Hyperlipidemia   . Anemia   . Morbid obesity   . Coronary artery disease      a. Cath 2010: non obstructive CAD (30% LAD proximal, mid and distal lesions, 60% diagonal, 30% Dominant mid circumflex) b. Lexiscan Myoview (05/2013) Fixed anteroapical scar of medium sixe, no reversible ischemia  . Bipolar 1 disorder   . Carpal tunnel syndrome   . Abscess of anal and rectal regions   . Hypertension   . OSA on CPAP   . Diabetic gastroparesis   . Diabetic nephropathy   . Chronic kidney disease (CKD), stage IV (severe)   . Diabetes mellitus type 1   . Chronic diastolic CHF (congestive heart failure)   . Aortic stenosis, mild     Current Outpatient Prescriptions  Medication Sig Dispense Refill  . acetaminophen (TYLENOL) 500 MG tablet Take 500 mg by mouth every 6 (  six) hours as needed for mild pain.    Marland Kitchen albuterol (PROVENTIL HFA;VENTOLIN HFA) 108 (90 BASE) MCG/ACT inhaler Inhale 2 puffs into the lungs every 6 (six) hours as needed for wheezing. 1 Inhaler 2  . amLODipine (NORVASC) 10 MG tablet Take 10 mg by mouth daily.    Marland Kitchen aspirin 81 MG tablet Take 81 mg by mouth daily. For pain    . atorvastatin (LIPITOR) 80 MG tablet Take 1 tablet (80 mg total) by mouth daily at 6 PM. 30 tablet 2  . Blood Glucose Monitoring Suppl (ONE TOUCH ULTRA 2) W/DEVICE KIT Check CBG's TID and prn    . capsicum (ZOSTRIX) 0.075 % topical cream Apply 1  application topically 3 (three) times daily. Apply to feet up to 3x daily 5 g 3  . carvedilol (COREG) 6.25 MG tablet TAKE 2 TABLETS (12.5 MG TOTAL) BY MOUTH 2 (TWO) TIMES DAILY WITH A MEAL. 90 tablet 3  . clopidogrel (PLAVIX) 75 MG tablet Take 1 tablet (75 mg total) by mouth daily with breakfast. 30 tablet 9  . DULoxetine (CYMBALTA) 60 MG capsule Take 1 capsule (60 mg total) by mouth daily. 30 capsule 6  . ferrous sulfate 325 (65 FE) MG tablet Take 325 mg by mouth 2 (two) times daily.      Marland Kitchen glucose blood test strip Check glucose TID and prn.    Marland Kitchen HYDROcodone-acetaminophen (NORCO) 5-325 MG per tablet Take 1 tablet by mouth every 6 (six) hours as needed for moderate pain. 30 tablet 0  . insulin glargine (LANTUS) 100 UNIT/ML injection Inject 60 Units into the skin at bedtime.     . insulin lispro (HUMALOG) 100 UNIT/ML injection Inject 15 Units into the skin 3 (three) times daily before meals.     . metoCLOPramide (REGLAN) 10 MG tablet Take 10 mg by mouth 3 (three) times daily before meals. One po qac    . metolazone (ZAROXOLYN) 2.5 MG tablet TAKE 1 TABLET (2.5 MG TOTAL) BY MOUTH if weight goes up 2 lbs in 24 hrs or 3 lbs in a week. 6 tablet 3  . nitroGLYCERIN (NITROSTAT) 0.4 MG SL tablet Place 1 tablet (0.4 mg total) under the tongue every 5 (five) minutes as needed for chest pain. 25 tablet 12  . potassium chloride SA (K-DUR,KLOR-CON) 20 MEQ tablet Take 2 tablets (40 mEq total) by mouth 2 (two) times daily. 120 tablet 6  . pregabalin (LYRICA) 75 MG capsule Take 1 capsule (75 mg total) by mouth 2 (two) times daily. 60 capsule 3  . Prenatal Vit-Fe Fumarate-FA (PRENATAL MULTIVITAMIN) TABS tablet Take 1 tablet by mouth daily at 12 noon.    . torsemide (DEMADEX) 20 MG tablet Take 4 tablets (80 mg total) by mouth 2 (two) times daily. 240 tablet 3   No current facility-administered medications for this encounter.     Filed Vitals:   04/22/14 0910  BP: 132/60  Pulse: 73  Weight: 302 lb 1.9 oz  (137.041 kg)  SpO2: 97%    PHYSICAL EXAM: General:  Well appearing. No resp difficulty HEENT: normal Neck: supple. JVP difficult to assess d/t body habitus but appears elevated.  Carotids 2+ bilaterally; no bruits. No lymphadenopathy or thryomegaly appreciated. Cor: PMI normal. Regular rate & rhythm. No rubs, gallops or murmurs. Lungs: clear Abdomen: obese, soft, nontender, +distended. No hepatosplenomegaly. No bruits or masses. Good bowel sounds. Extremities: no cyanosis, clubbing, rash, LLE 1+ , RLE trace edema.  Neuro: alert & orientedx3, cranial nerves grossly intact. Moves all  4 extremities w/o difficulty. Affect pleasa    ASSESSMENT & PLAN:  1. Acute/Chronic diastolic Heart Failure:  EF 68-40% grade II diastolic dysfx (03/3531) - NYHA II-III. Volume status elevated. Offered IV lasix however she has to go to work today. Continue torsemide 80 mg BID and she will take 2.5 mg metolazone with extra 40 meq of potassium for the next 2 days.  She will then continue torsemide 80 mg twice a day and add 2.5 mg metolazone every Monday with an extra 40 meq of potassium.  Add compression stockings.  Check BMET and BNP now.  - Reinforced the need and importance of daily weights, a low sodium diet, and fluid restriction (less than 2 L a day). Instructed to call the HF clinic if weight increases more than 3 lbs overnight or 5 lbs in a week.  2. HTN - SBP stable. Continue norvasc 10 mg daily. Continue Carvedilol to 12.5  mg twice a day.  No ACE due to CKD.  3. OSA- Continue to wear CPAP nightly.  4. CKD, stage III- Followed by Dr Marval Regal. Check BMET now.  5. CAD: non-obstructive on last cath. Recent myoview showing no ischemia. Will continue Plavix, ASA, statin and BB.  6. DM- having difficulty managing glucose.  Followed by her PCP closely.   Follow up next week to reassess volume status.  CLEGG,AMY NP-C  9:42 AM

## 2014-04-30 ENCOUNTER — Ambulatory Visit (HOSPITAL_COMMUNITY)
Admission: RE | Admit: 2014-04-30 | Discharge: 2014-04-30 | Disposition: A | Discharge status: Home / Self Care | Payer: 59 | Source: Ambulatory Visit | Attending: Cardiology | Admitting: Cardiology

## 2014-04-30 ENCOUNTER — Other Ambulatory Visit: Payer: Self-pay | Admitting: Neurology

## 2014-04-30 ENCOUNTER — Encounter (HOSPITAL_COMMUNITY): Payer: Self-pay

## 2014-04-30 ENCOUNTER — Telehealth: Payer: Self-pay | Admitting: Neurology

## 2014-04-30 VITALS — BP 134/72 | HR 88 | Wt 304.0 lb

## 2014-04-30 DIAGNOSIS — I2510 Atherosclerotic heart disease of native coronary artery without angina pectoris: Secondary | ICD-10-CM | POA: Diagnosis not present

## 2014-04-30 DIAGNOSIS — F319 Bipolar disorder, unspecified: Secondary | ICD-10-CM | POA: Diagnosis not present

## 2014-04-30 DIAGNOSIS — Z7982 Long term (current) use of aspirin: Secondary | ICD-10-CM | POA: Diagnosis not present

## 2014-04-30 DIAGNOSIS — E1043 Type 1 diabetes mellitus with diabetic autonomic (poly)neuropathy: Secondary | ICD-10-CM | POA: Insufficient documentation

## 2014-04-30 DIAGNOSIS — E1021 Type 1 diabetes mellitus with diabetic nephropathy: Secondary | ICD-10-CM | POA: Insufficient documentation

## 2014-04-30 DIAGNOSIS — K3184 Gastroparesis: Secondary | ICD-10-CM | POA: Insufficient documentation

## 2014-04-30 DIAGNOSIS — I5022 Chronic systolic (congestive) heart failure: Secondary | ICD-10-CM

## 2014-04-30 DIAGNOSIS — I5033 Acute on chronic diastolic (congestive) heart failure: Secondary | ICD-10-CM | POA: Diagnosis not present

## 2014-04-30 DIAGNOSIS — Z794 Long term (current) use of insulin: Secondary | ICD-10-CM | POA: Insufficient documentation

## 2014-04-30 DIAGNOSIS — I350 Nonrheumatic aortic (valve) stenosis: Secondary | ICD-10-CM | POA: Diagnosis not present

## 2014-04-30 DIAGNOSIS — N184 Chronic kidney disease, stage 4 (severe): Secondary | ICD-10-CM | POA: Diagnosis not present

## 2014-04-30 DIAGNOSIS — Z79899 Other long term (current) drug therapy: Secondary | ICD-10-CM | POA: Insufficient documentation

## 2014-04-30 DIAGNOSIS — I5032 Chronic diastolic (congestive) heart failure: Secondary | ICD-10-CM | POA: Diagnosis present

## 2014-04-30 DIAGNOSIS — G4733 Obstructive sleep apnea (adult) (pediatric): Secondary | ICD-10-CM | POA: Diagnosis not present

## 2014-04-30 DIAGNOSIS — I1310 Hypertensive heart and chronic kidney disease without heart failure, with stage 1 through stage 4 chronic kidney disease, or unspecified chronic kidney disease: Secondary | ICD-10-CM | POA: Insufficient documentation

## 2014-04-30 LAB — BASIC METABOLIC PANEL
Anion gap: 15 (ref 5–15)
BUN: 103 mg/dL — AB (ref 6–23)
CHLORIDE: 98 mmol/L (ref 96–112)
CO2: 21 mmol/L (ref 19–32)
Calcium: 8.3 mg/dL — ABNORMAL LOW (ref 8.4–10.5)
Creatinine, Ser: 2.8 mg/dL — ABNORMAL HIGH (ref 0.50–1.10)
GFR calc Af Amer: 23 mL/min — ABNORMAL LOW (ref >90)
GFR calc non Af Amer: 20 mL/min — ABNORMAL LOW (ref >90)
Glucose, Bld: 316 mg/dL — ABNORMAL HIGH (ref 70–99)
Potassium: 3.8 mmol/L (ref 3.5–5.1)
Sodium: 134 mmol/L — ABNORMAL LOW (ref 135–145)

## 2014-04-30 MED ORDER — TORSEMIDE 20 MG PO TABS: 100.0000 mg | ORAL_TABLET | Freq: Two times a day (BID) | ORAL | Status: DC

## 2014-04-30 MED ORDER — DULOXETINE HCL 60 MG PO CPEP: 60.0000 mg | ORAL_CAPSULE | Freq: Two times a day (BID) | ORAL | Status: AC

## 2014-04-30 MED ORDER — METOLAZONE 2.5 MG PO TABS: 2.5000 mg | ORAL_TABLET | ORAL | Status: DC

## 2014-04-30 NOTE — Progress Notes (Signed)
Patient ID: Jasmine [name], female   DOB: [birth year], 44 y.o.   MRN: [id number]  PCP: Dr. Dalphine Handing Nephrology: Dr Arty Baumgartner Pulmonary: Dr. Elsworth Soho  HPI: Ms. Bonds is a 44 y.o African American female with history of morbid obesity, nonobstructive CAD per cath 2010, HTN, OSA, uncontrolled DM type 1, class III CKD and diastolic HF.    Admitted 05/25/13-05/30/13 for L shoulder pain, diabetic ketoacidosis and NSTEMI. Had nuclear test showing fixed anteroapical defect and no ischemia. VQ scan was negative. Started on Plavix.   Admitted 3/16 through 04/15/14 with volume overload. Diuresed with IV lasix and transitioned back to torsemide 80 mg twice a day. Discharge weight was 289 pounds.   She returns for follow up.  Last visit she had volume overload due to high salt diet and she was instructed to take metolazone for 2 days. Weight went down 6 pounds from 300 to 294 pounds.  SOB walking into the clinic. + Orthopnea. Trying to limit portions. Using CPAP nightly. Continues to work full time at [employer].   - V/Q scan on November 02, 2010, showing normal ventilation perfusion without evidence of PE.  - CT of the chest without contrast 10/2010 showing suspected multifocal alveolar edema in the bilateral upper lobes and superior left lower lobe. Multifocal infection considered less likely. Cardiomegaly with bilateral pleural effusions. No findings to suggest interstitial lung disease.  - 11/2010 CPX Peak VO2: 10.26ml/kg/min % predicted peak VO2: 57.2% (corrects to 19.6 for ideal body weight), VE/VCO2 slope: 52 (to peak exercise) 43 (to RCP), OUES: 1.08, Peak RER: 1.19, Ventilatory Threshold: 7.2 % predicted peak VO2: 37.6%, O2pulse: 12 % predicted O2pulse: 90% - Sleep study completed 11/25/2010. Dr Elsworth Soho evaluated severe sleep apnea, placed on CPAP.  ECHOs (09/2012): EF 55-60% with grade II diastolic dysfx ECHOs (09/3714): EF 55-60% with grade II DD  Labs     10/25/12: K+ 4.8, Cr 3.06 (ramipril stopped)     11/01/12:  K+ 5.0  Cr 1.8 (Baseline 2.3)     08/08/13 K 4.1 Creatinine 1.8       04/22/2014: K 3.8 Creatinine 2.32  BNP 244   SH: Works FT at post office. Lives with boyfriend in [place]. Occassional ETOH. No tobacco abuse or drug abuse.   FH: Mother deceased: CAD, HTN, DM2, TIAs        Father living: Alzheimers, HIV   ROS: All systems negative except as listed in HPI, PMH and Problem List.  Past Medical History  Diagnosis Date  . Diastolic heart failure     a. 2D ECHO on 04/11/2014 w/ EF: 55- 60%, mild LVH. No RWMA, G2DD, mild AS, MIld MR, mild LAE  . Hypertensive heart disease   . Hyperlipidemia   . Anemia   . Morbid obesity   . Coronary artery disease      a. Cath 2010: non obstructive CAD (30% LAD proximal, mid and distal lesions, 60% diagonal, 30% Dominant mid circumflex) b. Lexiscan Myoview (05/2013) Fixed anteroapical scar of medium sixe, no reversible ischemia  . Bipolar 1 disorder   . Carpal tunnel syndrome   . Abscess of anal and rectal regions   . Hypertension   . OSA on CPAP   . Diabetic gastroparesis   . Diabetic nephropathy   . Chronic kidney disease (CKD), stage IV (severe)   . Diabetes mellitus type 1   . Chronic diastolic CHF (congestive heart failure)   . Aortic stenosis, mild     Current Outpatient Prescriptions  Medication Sig  Dispense Refill  . acetaminophen (TYLENOL) 500 MG tablet Take 500 mg by mouth every 6 (six) hours as needed for mild pain.    Marland Kitchen albuterol (PROVENTIL HFA;VENTOLIN HFA) 108 (90 BASE) MCG/ACT inhaler Inhale 2 puffs into the lungs every 6 (six) hours as needed for wheezing. 1 Inhaler 2  . amLODipine (NORVASC) 10 MG tablet Take 10 mg by mouth daily.    Marland Kitchen aspirin 81 MG tablet Take 81 mg by mouth daily. For pain    . atorvastatin (LIPITOR) 80 MG tablet Take 1 tablet (80 mg total) by mouth daily at 6 PM. 30 tablet 2  . Blood Glucose Monitoring Suppl (ONE TOUCH ULTRA 2) W/DEVICE KIT Check CBG's TID and prn    . capsicum (ZOSTRIX) 0.075 % topical cream Apply 1  application topically 3 (three) times daily. Apply to feet up to 3x daily 5 g 3  . carvedilol (COREG) 6.25 MG tablet TAKE 2 TABLETS (12.5 MG TOTAL) BY MOUTH 2 (TWO) TIMES DAILY WITH A MEAL. 90 tablet 3  . DULoxetine (CYMBALTA) 60 MG capsule Take 1 capsule (60 mg total) by mouth 2 (two) times daily. 60 capsule 6  . ferrous sulfate 325 (65 FE) MG tablet Take 325 mg by mouth 2 (two) times daily.      Marland Kitchen glucose blood test strip Check glucose TID and prn.    Marland Kitchen HYDROcodone-acetaminophen (NORCO) 5-325 MG per tablet Take 1 tablet by mouth every 6 (six) hours as needed for moderate pain. 30 tablet 0  . insulin glargine (LANTUS) 100 UNIT/ML injection Inject 60 Units into the skin at bedtime.     . insulin lispro (HUMALOG) 100 UNIT/ML injection Inject 15 Units into the skin 3 (three) times daily before meals.     . metoCLOPramide (REGLAN) 10 MG tablet Take 10 mg by mouth 3 (three) times daily before meals. One po qac    . metolazone (ZAROXOLYN) 2.5 MG tablet Take 1 tablet (2.5 mg total) by mouth once a week. On Monday 6 tablet 3  . nitroGLYCERIN (NITROSTAT) 0.4 MG SL tablet Place 1 tablet (0.4 mg total) under the tongue every 5 (five) minutes as needed for chest pain. 25 tablet 12  . potassium chloride SA (K-DUR,KLOR-CON) 20 MEQ tablet Take 2 tablets (40 mEq total) by mouth 2 (two) times daily. On Mondays take an additional 40 MeQ daily 120 tablet 6  . Prenatal Vit-Fe Fumarate-FA (PRENATAL MULTIVITAMIN) TABS tablet Take 1 tablet by mouth daily at 12 noon.    . torsemide (DEMADEX) 20 MG tablet Take 4 tablets (80 mg total) by mouth 2 (two) times daily. 240 tablet 3   No current facility-administered medications for this encounter.     Filed Vitals:   04/30/14 1547  BP: 134/72  Pulse: 88  Weight: 304 lb (137.893 kg)  SpO2: 94%    PHYSICAL EXAM: General:  Well appearing. No resp difficulty HEENT: normal Neck: supple. JVP 12 cm.  Carotids 2+ bilaterally; no bruits. No lymphadenopathy or thryomegaly  appreciated. Cor: PMI normal. Regular rate & rhythm. No rubs, gallops.  2/6 early SEM RUSB.  Lungs: clear Abdomen: obese, soft, nontender, +distended. No hepatosplenomegaly. No bruits or masses. Good bowel sounds. Extremities: no cyanosis, clubbing, rash.  1+ edema to knees bilaterally.  Neuro: alert & orientedx3, cranial nerves grossly intact. Moves all 4 extremities w/o difficulty. Affect pleasa  ASSESSMENT & PLAN:  1. Chronic diastolic heart failure:  EF 64-40% grade II diastolic dysfx (03/4740). NYHA II-III. Volume status elevated.  -  Return to clinic in am for 120 mg IV lasix.  - Increase torsemide to 100 mg twice a day. Increase metolazone to 2.5 mg every Mon and Fri.  - Discussed Cardiomems. Chest circumference 120 cm. She would like to pursue. Set up next week.  - Reinforced the need and importance of daily weights, a low sodium diet, and fluid restriction (less than 2 L a day). Instructed to call the HF clinic if weight increases more than 3 lbs overnight or 5 lbs in a week.  2. HTN: SBP stable. Continue norvasc 10 mg daily. Continue Carvedilol to 12.5  mg twice a day.  No ACE due to CKD.  3. OSA: Continue to wear CPAP nightly.  4. CKD, stage III: Followed by Dr Marval Regal. Check BMET now.  5. CAD: Non-obstructive on last cath. Recent myoview showing no ischemia. Will continue Plavix, ASA, statin and BB.  6. DM: having difficulty managing glucose.  Followed by her PCP closely.   Follow up 2 weeks.  CLEGG,AMY NP-C  3:13 PM   Patient seen with NP, agree with note.  She remains volume overloaded on exam.  She will increase torsemide to 100 mg bid with metolazone twice a week.  She will get IV Lasix tomorrow morning.  I think she would be a reasonable Cardiomems candidate, will set up for next week.   Loralie Champagne 05/01/2014

## 2014-04-30 NOTE — Telephone Encounter (Signed)
Patient stated Rx pregabalin (LYRICA) 75 MG capsule isn't beneficial.  Patient's experiencing edema in lower extremities, pain, and very hard to walk.  Questioning if she could weaned off medication.  Please call and advise.

## 2014-04-30 NOTE — Patient Instructions (Signed)
Increase Metolazone to 2.5 mg every Monday and Friday  Increase Torsemide to 100 mg (5 tabs) Twice daily   Come back tomorrow for IV Lasix  Right Heart Catheterization for Cardiomems implant will be on 05/07/14, we will give you instructions tomorrow  Your physician recommends that you schedule a follow-up appointment in: 2 weeks

## 2014-04-30 NOTE — Telephone Encounter (Signed)
Spoke to patient. Will slowly titrate Lyrica off. Will increase Cymbalta to BID (states her blood pressure has been normal).

## 2014-05-01 ENCOUNTER — Ambulatory Visit (HOSPITAL_COMMUNITY)
Admission: RE | Admit: 2014-05-01 | Discharge: 2014-05-01 | Disposition: A | Discharge status: Home / Self Care | Payer: 59 | Source: Ambulatory Visit | Attending: Cardiology | Admitting: Cardiology

## 2014-05-01 DIAGNOSIS — I5033 Acute on chronic diastolic (congestive) heart failure: Secondary | ICD-10-CM

## 2014-05-01 MED ORDER — FUROSEMIDE 10 MG/ML IJ SOLN
120.0000 mg | Freq: Once | INTRAVENOUS | Status: AC
  Administered 2014-05-01: 120 mg via INTRAVENOUS

## 2014-05-01 MED ORDER — POTASSIUM CHLORIDE CRYS ER 20 MEQ PO TBCR
40.0000 meq | EXTENDED_RELEASE_TABLET | Freq: Once | ORAL | Status: AC
  Administered 2014-05-01: 40 meq via ORAL

## 2014-05-01 NOTE — Progress Notes (Signed)
Patient returned to clinic per Amy Clegg NP-C for 120mg  IV lasix.  22g PIV placed in LLAC, 120mg  IV lasix administered, flushed behind and capped.  Patient also given 40meq potassium PO.  Bedside commode and call bell at patient's side in room D of clinic.  Will continue to monitor closely.  Pre-Weight: 308lb 8oz  Total Urinary Output: 300cc  Post-Weight: 308lb Alen Bleacher2oz  Charniece Venturino, Bettina GaviaMegan Genevea

## 2014-05-01 NOTE — Patient Instructions (Signed)
Procedure on 05/07/14 has been cancelled  We have called Dr Hadley Penoladonato's office and left a mess you need to see them ASAP, if you don't hear from them by Monday please call us back  Keep appointment as scheduled on 4/20

## 2014-05-02 ENCOUNTER — Inpatient Hospital Stay (HOSPITAL_COMMUNITY)
Admission: EM | Admit: 2014-05-02 | Discharge: 2014-05-06 | DRG: 292 | Disposition: A | Discharge status: Home / Self Care | Payer: 59 | Attending: Internal Medicine | Admitting: Internal Medicine

## 2014-05-02 ENCOUNTER — Other Ambulatory Visit (HOSPITAL_COMMUNITY): Payer: Self-pay

## 2014-05-02 ENCOUNTER — Emergency Department (HOSPITAL_COMMUNITY): Payer: 59

## 2014-05-02 ENCOUNTER — Encounter (HOSPITAL_COMMUNITY): Payer: Self-pay | Admitting: Emergency Medicine

## 2014-05-02 DIAGNOSIS — R600 Localized edema: Secondary | ICD-10-CM | POA: Diagnosis not present

## 2014-05-02 DIAGNOSIS — D649 Anemia, unspecified: Secondary | ICD-10-CM

## 2014-05-02 DIAGNOSIS — I252 Old myocardial infarction: Secondary | ICD-10-CM | POA: Diagnosis not present

## 2014-05-02 DIAGNOSIS — I509 Heart failure, unspecified: Secondary | ICD-10-CM | POA: Insufficient documentation

## 2014-05-02 DIAGNOSIS — D631 Anemia in chronic kidney disease: Secondary | ICD-10-CM | POA: Diagnosis present

## 2014-05-02 DIAGNOSIS — N179 Acute kidney failure, unspecified: Secondary | ICD-10-CM

## 2014-05-02 DIAGNOSIS — R0602 Shortness of breath: Secondary | ICD-10-CM

## 2014-05-02 DIAGNOSIS — I5033 Acute on chronic diastolic (congestive) heart failure: Principal | ICD-10-CM | POA: Diagnosis present

## 2014-05-02 DIAGNOSIS — G4733 Obstructive sleep apnea (adult) (pediatric): Secondary | ICD-10-CM | POA: Diagnosis present

## 2014-05-02 DIAGNOSIS — Z7982 Long term (current) use of aspirin: Secondary | ICD-10-CM | POA: Diagnosis not present

## 2014-05-02 DIAGNOSIS — F319 Bipolar disorder, unspecified: Secondary | ICD-10-CM | POA: Diagnosis present

## 2014-05-02 DIAGNOSIS — N184 Chronic kidney disease, stage 4 (severe): Secondary | ICD-10-CM | POA: Diagnosis present

## 2014-05-02 DIAGNOSIS — F419 Anxiety disorder, unspecified: Secondary | ICD-10-CM | POA: Diagnosis present

## 2014-05-02 DIAGNOSIS — Z9119 Patient's noncompliance with other medical treatment and regimen: Secondary | ICD-10-CM | POA: Diagnosis present

## 2014-05-02 DIAGNOSIS — Z794 Long term (current) use of insulin: Secondary | ICD-10-CM | POA: Diagnosis not present

## 2014-05-02 DIAGNOSIS — I350 Nonrheumatic aortic (valve) stenosis: Secondary | ICD-10-CM | POA: Diagnosis present

## 2014-05-02 DIAGNOSIS — Z9989 Dependence on other enabling machines and devices: Secondary | ICD-10-CM

## 2014-05-02 DIAGNOSIS — E785 Hyperlipidemia, unspecified: Secondary | ICD-10-CM | POA: Diagnosis present

## 2014-05-02 DIAGNOSIS — Z6841 Body Mass Index (BMI) 40.0 and over, adult: Secondary | ICD-10-CM

## 2014-05-02 DIAGNOSIS — E1122 Type 2 diabetes mellitus with diabetic chronic kidney disease: Secondary | ICD-10-CM | POA: Diagnosis not present

## 2014-05-02 DIAGNOSIS — I10 Essential (primary) hypertension: Secondary | ICD-10-CM | POA: Diagnosis present

## 2014-05-02 DIAGNOSIS — N2581 Secondary hyperparathyroidism of renal origin: Secondary | ICD-10-CM | POA: Diagnosis present

## 2014-05-02 DIAGNOSIS — Z8249 Family history of ischemic heart disease and other diseases of the circulatory system: Secondary | ICD-10-CM

## 2014-05-02 DIAGNOSIS — E109 Type 1 diabetes mellitus without complications: Secondary | ICD-10-CM | POA: Diagnosis present

## 2014-05-02 DIAGNOSIS — Z9114 Patient's other noncompliance with medication regimen: Secondary | ICD-10-CM

## 2014-05-02 DIAGNOSIS — B9689 Other specified bacterial agents as the cause of diseases classified elsewhere: Secondary | ICD-10-CM

## 2014-05-02 DIAGNOSIS — K3184 Gastroparesis: Secondary | ICD-10-CM | POA: Diagnosis present

## 2014-05-02 DIAGNOSIS — Z7902 Long term (current) use of antithrombotics/antiplatelets: Secondary | ICD-10-CM | POA: Diagnosis not present

## 2014-05-02 DIAGNOSIS — N390 Urinary tract infection, site not specified: Secondary | ICD-10-CM

## 2014-05-02 DIAGNOSIS — E876 Hypokalemia: Secondary | ICD-10-CM | POA: Diagnosis present

## 2014-05-02 DIAGNOSIS — D638 Anemia in other chronic diseases classified elsewhere: Secondary | ICD-10-CM | POA: Diagnosis present

## 2014-05-02 DIAGNOSIS — I2510 Atherosclerotic heart disease of native coronary artery without angina pectoris: Secondary | ICD-10-CM | POA: Diagnosis present

## 2014-05-02 DIAGNOSIS — E1165 Type 2 diabetes mellitus with hyperglycemia: Secondary | ICD-10-CM | POA: Diagnosis present

## 2014-05-02 DIAGNOSIS — I130 Hypertensive heart and chronic kidney disease with heart failure and stage 1 through stage 4 chronic kidney disease, or unspecified chronic kidney disease: Secondary | ICD-10-CM | POA: Diagnosis present

## 2014-05-02 HISTORY — DX: Personal history of other medical treatment: Z92.89

## 2014-05-02 HISTORY — DX: Pneumonia, unspecified organism: J18.9

## 2014-05-02 LAB — CBG MONITORING, ED
GLUCOSE-CAPILLARY: 486 mg/dL — AB (ref 70–99)
Glucose-Capillary: 578 mg/dL (ref 70–99)
Glucose-Capillary: 550 mg/dL — ABNORMAL HIGH (ref 70–99)
Glucose-Capillary: 512 mg/dL — ABNORMAL HIGH (ref 70–99)
Glucose-Capillary: 362 mg/dL — ABNORMAL HIGH (ref 70–99)
Glucose-Capillary: 247 mg/dL — ABNORMAL HIGH (ref 70–99)

## 2014-05-02 LAB — BASIC METABOLIC PANEL
Anion gap: 14 (ref 5–15)
Anion gap: 15 (ref 5–15)
Anion gap: 13 (ref 5–15)
BUN: 103 mg/dL — ABNORMAL HIGH (ref 6–23)
BUN: 101 mg/dL — AB (ref 6–23)
BUN: 102 mg/dL — AB (ref 6–23)
CALCIUM: 8.3 mg/dL — AB (ref 8.4–10.5)
CALCIUM: 8.5 mg/dL (ref 8.4–10.5)
CHLORIDE: 96 mmol/L (ref 96–112)
CHLORIDE: 98 mmol/L (ref 96–112)
CO2: 19 mmol/L (ref 19–32)
CO2: 18 mmol/L — ABNORMAL LOW (ref 19–32)
CO2: 23 mmol/L (ref 19–32)
Calcium: 8.3 mg/dL — ABNORMAL LOW (ref 8.4–10.5)
Chloride: 94 mmol/L — ABNORMAL LOW (ref 96–112)
Creatinine, Ser: 2.85 mg/dL — ABNORMAL HIGH (ref 0.50–1.10)
Creatinine, Ser: 2.83 mg/dL — ABNORMAL HIGH (ref 0.50–1.10)
Creatinine, Ser: 2.7 mg/dL — ABNORMAL HIGH (ref 0.50–1.10)
GFR calc Af Amer: 22 mL/min — ABNORMAL LOW (ref >90)
GFR calc non Af Amer: 19 mL/min — ABNORMAL LOW (ref >90)
GFR calc non Af Amer: 19 mL/min — ABNORMAL LOW (ref >90)
GFR, EST AFRICAN AMERICAN: 22 mL/min — AB (ref >90)
GFR, EST AFRICAN AMERICAN: 24 mL/min — AB (ref >90)
GFR, EST NON AFRICAN AMERICAN: 20 mL/min — AB (ref >90)
Glucose, Bld: 611 mg/dL (ref 70–99)
Glucose, Bld: 577 mg/dL (ref 70–99)
Glucose, Bld: 294 mg/dL — ABNORMAL HIGH (ref 70–99)
POTASSIUM: 3.7 mmol/L (ref 3.5–5.1)
POTASSIUM: 3.8 mmol/L (ref 3.5–5.1)
Potassium: 3.8 mmol/L (ref 3.5–5.1)
SODIUM: 129 mmol/L — AB (ref 135–145)
Sodium: 131 mmol/L — ABNORMAL LOW (ref 135–145)
Sodium: 130 mmol/L — ABNORMAL LOW (ref 135–145)

## 2014-05-02 LAB — CBC
HCT: 28.1 % — ABNORMAL LOW (ref 36.0–46.0)
Hemoglobin: 9.1 g/dL — ABNORMAL LOW (ref 12.0–15.0)
MCH: 28.8 pg (ref 26.0–34.0)
MCHC: 32.4 g/dL (ref 30.0–36.0)
MCV: 88.9 fL (ref 78.0–100.0)
Platelets: 316 10*3/uL (ref 150–400)
RBC: 3.16 MIL/uL — ABNORMAL LOW (ref 3.87–5.11)
RDW: 13.6 % (ref 11.5–15.5)
WBC: 13.6 10*3/uL — ABNORMAL HIGH (ref 4.0–10.5)

## 2014-05-02 LAB — RAPID URINE DRUG SCREEN, HOSP PERFORMED
Amphetamines: NOT DETECTED
Barbiturates: NOT DETECTED
Benzodiazepines: NOT DETECTED
COCAINE: NOT DETECTED
Opiates: NOT DETECTED
TETRAHYDROCANNABINOL: NOT DETECTED

## 2014-05-02 LAB — I-STAT VENOUS BLOOD GAS, ED
Acid-base deficit: 3 mmol/L — ABNORMAL HIGH (ref 0.0–2.0)
Bicarbonate: 20.8 mEq/L (ref 20.0–24.0)
O2 Saturation: 63 %
PCO2 VEN: 33.6 mmHg — AB (ref 45.0–50.0)
PH VEN: 7.4 — AB (ref 7.250–7.300)
TCO2: 22 mmol/L (ref 0–100)
pO2, Ven: 32 mmHg (ref 30.0–45.0)

## 2014-05-02 LAB — URINE MICROSCOPIC-ADD ON

## 2014-05-02 LAB — MAGNESIUM: MAGNESIUM: 2.2 mg/dL (ref 1.5–2.5)

## 2014-05-02 LAB — TROPONIN I: Troponin I: <0.03 ng/mL (ref <0.031)

## 2014-05-02 LAB — URINALYSIS, ROUTINE W REFLEX MICROSCOPIC
BILIRUBIN URINE: NEGATIVE
GLUCOSE, UA: 500 mg/dL — AB
Hgb urine dipstick: NEGATIVE
Ketones, ur: NEGATIVE mg/dL
NITRITE: NEGATIVE
Protein, ur: 100 mg/dL — AB
Specific Gravity, Urine: 1.012 (ref 1.005–1.030)
Urobilinogen, UA: 0.2 mg/dL (ref 0.0–1.0)
pH: 5 (ref 5.0–8.0)

## 2014-05-02 LAB — BRAIN NATRIURETIC PEPTIDE: B Natriuretic Peptide: 382.7 pg/mL — ABNORMAL HIGH (ref 0.0–100.0)

## 2014-05-02 LAB — GLUCOSE, CAPILLARY
GLUCOSE-CAPILLARY: 270 mg/dL — AB (ref 70–99)
Glucose-Capillary: 294 mg/dL — ABNORMAL HIGH (ref 70–99)

## 2014-05-02 LAB — I-STAT TROPONIN, ED: Troponin i, poc: 0 ng/mL (ref 0.00–0.08)

## 2014-05-02 LAB — HIV ANTIBODY (ROUTINE TESTING W REFLEX): HIV SCREEN 4TH GENERATION: NONREACTIVE

## 2014-05-02 MED ORDER — SODIUM CHLORIDE 0.9 % IV BOLUS (SEPSIS): 2000.0000 mL | Freq: Once | INTRAVENOUS | Status: DC

## 2014-05-02 MED ORDER — CHLORHEXIDINE GLUCONATE CLOTH 2 % EX PADS
6.0000 | MEDICATED_PAD | Freq: Every day | CUTANEOUS | Status: DC
  Administered 2014-05-03 – 2014-05-06 (×4): 6 via TOPICAL

## 2014-05-02 MED ORDER — DEXTROSE-NACL 5-0.45 % IV SOLN: INTRAVENOUS | Status: DC

## 2014-05-02 MED ORDER — HYDROCODONE-ACETAMINOPHEN 5-325 MG PO TABS
1.0000 | ORAL_TABLET | Freq: Four times a day (QID) | ORAL | Status: DC | PRN
  Administered 2014-05-02: 1 via ORAL
  Filled 2014-05-02: qty 1

## 2014-05-02 MED ORDER — FERROUS SULFATE 325 (65 FE) MG PO TABS
325.0000 mg | ORAL_TABLET | Freq: Two times a day (BID) | ORAL | Status: DC
  Administered 2014-05-02 – 2014-05-06 (×8): 325 mg via ORAL
  Filled 2014-05-02 (×10): qty 1

## 2014-05-02 MED ORDER — MUPIROCIN 2 % EX OINT
1.0000 "application " | TOPICAL_OINTMENT | Freq: Two times a day (BID) | CUTANEOUS | Status: DC
  Administered 2014-05-02 – 2014-05-06 (×8): 1 via NASAL
  Filled 2014-05-02: qty 22

## 2014-05-02 MED ORDER — DEXTROSE 5 % IV SOLN
1.0000 g | Freq: Once | INTRAVENOUS | Status: AC
  Administered 2014-05-02: 1 g via INTRAVENOUS
  Filled 2014-05-02: qty 10

## 2014-05-02 MED ORDER — FUROSEMIDE 10 MG/ML IJ SOLN
80.0000 mg | Freq: Once | INTRAMUSCULAR | Status: AC
  Administered 2014-05-02: 80 mg via INTRAVENOUS
  Filled 2014-05-02: qty 8

## 2014-05-02 MED ORDER — ALBUTEROL SULFATE (2.5 MG/3ML) 0.083% IN NEBU: 2.5000 mg | INHALATION_SOLUTION | Freq: Four times a day (QID) | RESPIRATORY_TRACT | Status: DC | PRN

## 2014-05-02 MED ORDER — NITROGLYCERIN 0.4 MG SL SUBL: 0.4000 mg | SUBLINGUAL_TABLET | SUBLINGUAL | Status: DC | PRN

## 2014-05-02 MED ORDER — INSULIN GLARGINE 100 UNIT/ML ~~LOC~~ SOLN
30.0000 [IU] | Freq: Every day | SUBCUTANEOUS | Status: DC
  Administered 2014-05-02: 30 [IU] via SUBCUTANEOUS
  Filled 2014-05-02 (×2): qty 0.3

## 2014-05-02 MED ORDER — SODIUM CHLORIDE 0.9 % IV SOLN
INTRAVENOUS | Status: DC
  Administered 2014-05-02: 4.9 [IU]/h via INTRAVENOUS
  Filled 2014-05-02: qty 2.5

## 2014-05-02 MED ORDER — CARVEDILOL 12.5 MG PO TABS
12.5000 mg | ORAL_TABLET | Freq: Two times a day (BID) | ORAL | Status: DC
  Administered 2014-05-02 – 2014-05-06 (×8): 12.5 mg via ORAL
  Filled 2014-05-02 (×10): qty 1

## 2014-05-02 MED ORDER — DULOXETINE HCL 60 MG PO CPEP
60.0000 mg | ORAL_CAPSULE | Freq: Two times a day (BID) | ORAL | Status: DC
  Administered 2014-05-02 – 2014-05-06 (×8): 60 mg via ORAL
  Filled 2014-05-02 (×10): qty 1

## 2014-05-02 MED ORDER — CLOPIDOGREL BISULFATE 75 MG PO TABS
75.0000 mg | ORAL_TABLET | Freq: Every day | ORAL | Status: DC
  Administered 2014-05-03 – 2014-05-06 (×4): 75 mg via ORAL
  Filled 2014-05-02 (×5): qty 1

## 2014-05-02 MED ORDER — INSULIN ASPART 100 UNIT/ML ~~LOC~~ SOLN
0.0000 [IU] | Freq: Three times a day (TID) | SUBCUTANEOUS | Status: DC
Start: 2014-05-02 — End: 2014-05-06
  Administered 2014-05-02: 11 [IU] via SUBCUTANEOUS
  Administered 2014-05-03 (×3): 7 [IU] via SUBCUTANEOUS
  Administered 2014-05-04 (×3): 4 [IU] via SUBCUTANEOUS
  Administered 2014-05-05 – 2014-05-06 (×5): 3 [IU] via SUBCUTANEOUS

## 2014-05-02 MED ORDER — ASPIRIN EC 81 MG PO TBEC
81.0000 mg | DELAYED_RELEASE_TABLET | Freq: Every day | ORAL | Status: DC
  Administered 2014-05-02 – 2014-05-06 (×5): 81 mg via ORAL
  Filled 2014-05-02 (×5): qty 1

## 2014-05-02 MED ORDER — METOCLOPRAMIDE HCL 10 MG PO TABS
10.0000 mg | ORAL_TABLET | Freq: Three times a day (TID) | ORAL | Status: DC
  Administered 2014-05-02 – 2014-05-06 (×13): 10 mg via ORAL
  Filled 2014-05-02 (×14): qty 1

## 2014-05-02 MED ORDER — ATORVASTATIN CALCIUM 80 MG PO TABS
80.0000 mg | ORAL_TABLET | Freq: Every day | ORAL | Status: DC
  Administered 2014-05-02 – 2014-05-05 (×4): 80 mg via ORAL
  Filled 2014-05-02 (×5): qty 1

## 2014-05-02 MED ORDER — FUROSEMIDE 10 MG/ML IJ SOLN
160.0000 mg | Freq: Four times a day (QID) | INTRAVENOUS | Status: DC
  Administered 2014-05-02: 160 mg via INTRAVENOUS
  Filled 2014-05-02 (×4): qty 16

## 2014-05-02 MED ORDER — METOLAZONE 10 MG PO TABS
10.0000 mg | ORAL_TABLET | Freq: Once | ORAL | Status: AC
  Administered 2014-05-02: 10 mg via ORAL
  Filled 2014-05-02 (×2): qty 1

## 2014-05-02 MED ORDER — POTASSIUM CHLORIDE CRYS ER 20 MEQ PO TBCR
40.0000 meq | EXTENDED_RELEASE_TABLET | Freq: Once | ORAL | Status: AC
  Administered 2014-05-02: 40 meq via ORAL
  Filled 2014-05-02: qty 2

## 2014-05-02 MED ORDER — FUROSEMIDE 10 MG/ML IJ SOLN
160.0000 mg | Freq: Four times a day (QID) | INTRAVENOUS | Status: DC
  Administered 2014-05-02 – 2014-05-04 (×6): 160 mg via INTRAVENOUS
  Filled 2014-05-02 (×9): qty 16

## 2014-05-02 MED ORDER — SODIUM CHLORIDE 0.9 % IJ SOLN
3.0000 mL | Freq: Two times a day (BID) | INTRAMUSCULAR | Status: DC
  Administered 2014-05-02 – 2014-05-06 (×9): 3 mL via INTRAVENOUS

## 2014-05-02 MED ORDER — PRENATAL MULTIVITAMIN CH
1.0000 | ORAL_TABLET | Freq: Every day | ORAL | Status: DC
  Administered 2014-05-03 – 2014-05-06 (×4): 1 via ORAL
  Filled 2014-05-02 (×4): qty 1

## 2014-05-02 NOTE — Progress Notes (Signed)
Pt placed on contact precautions for History of MRSA. Tested positive within last 30 days.

## 2014-05-02 NOTE — ED Notes (Signed)
CBG 247 

## 2014-05-02 NOTE — Consult Note (Signed)
Advanced Heart Failure Team Consult Note  Referring Physician: Dr Ronnald Ramp  Primary Physician: Dr Dalphine Handing Primary Cardiologist:  Dr Aundra Dubin.  Nephrologist: Dr Marval Regal  Reason for Consultation: Heart Failure not responding to escalation oh home diuretics complicated by worsening renal function.   HPI:    [name] with history of morbid obesity, nonobstructive CAD per cath in 2010, HTN, OSA, uncontrolled DM type 1, class III CKD and diastolic HF, presenting with dyspnea and weight gain in the setting of acute on chronic diastolic CHF not responding to increased diuretic regimen and worsening renal function.   She has been followed cloesly in the HF clinic and was last on 4/5 for follow up with volume overload noted. She had ongoing volume overload and she was instructed to increase torsemide to 100 mg twice daily and return the next for IV lasix.  She returned for RN visit on 4/6 for 120 mg IV lasix. She had less than 500 cc urine output and at that point Dr Aundra Dubin requested follow up with nephrology ASAP.   Earlier today she present to [facility] ED with increased dyspnea and hyperglycemia. Given 80 mg IV lasix. CXR showed pulmonary edema. Nephrology consulted.   Breathing improving.   Pertinent admission labs include: Glucose 577, BUN 101, Creatinine 2.83, K 3.7, Na 131, and troponin 0.03.   Review of Systems: [y] = yes, _0  = no   General: Weight gain [Y ]; Weight loss _1 ; Anorexia _2 ; Fatigue [Y ]; Fever _3 ; Chills _4 ; Weakness [Y ]  Cardiac: Chest pain/pressure _5 ; Resting SOB _6 ; Exertional SOB [ Y]; Orthopnea [ Y]; Pedal Edema [Y ]; Palpitations _7 ; Syncope _8 ; Presyncope _9 ; Paroxysmal nocturnal dyspnea_10   Pulmonary: Cough _11 ; Wheezing_12 ; Hemoptysis_13 ; Sputum _14 ; Snoring _15   GI: Vomiting_16 ; Dysphagia_17 ; Melena_18 ; Hematochezia _19 ; Heartburn_20 ; Abdominal pain _21 ; Constipation _22 ; Diarrhea _23 ; BRBPR _24   GU: Hematuria_25 ; Dysuria _26 ; Nocturia_27    Vascular: Pain in legs with walking _28 ; Pain in feet with lying flat _29 ; Non-healing sores _30 ; Stroke _31 ; TIA _32 ; Slurred speech _33 ;  Neuro: Headaches_34 ; Vertigo_35 ; Seizures_36 ; Paresthesias_37 ;Blurred vision _38 ; Diplopia _39 ; Vision changes _40   Ortho/Skin: Arthritis _41 ; Joint pain [Y ]; Muscle pain _42 ; Joint swelling _43 ; Back Pain [Y ]; Rash _44   Psych: Depression_45 ; Anxiety_46   Heme: Bleeding problems _47 ; Clotting disorders _48 ; Anemia _49   Endocrine: Diabetes [Y ]; Thyroid dysfunction_50   Home Medications Prior to Admission medications   Medication Sig Start Date End Date Taking? Authorizing Provider  acetaminophen (TYLENOL) 500 MG tablet Take 500 mg by mouth every 6 (six) hours as needed for mild pain.   Yes Historical Provider, MD  amLODipine (NORVASC) 10 MG tablet Take 10 mg by mouth daily.   Yes Historical Provider, MD  aspirin 81 MG tablet Take 81 mg by mouth daily. For pain   Yes Historical Provider, MD  atorvastatin (LIPITOR) 80 MG tablet Take 1 tablet (80 mg total) by mouth daily at 6 PM. 05/30/13  Yes Estela Leonie Green, MD  Blood Glucose Monitoring Suppl (ONE TOUCH ULTRA 2) W/DEVICE KIT Check CBG's TID and prn 12/13/13  Yes Historical Provider, MD  carvedilol (COREG) 6.25 MG tablet TAKE 2 TABLETS (12.5 MG TOTAL) BY MOUTH 2 (TWO)  TIMES DAILY WITH A MEAL. 04/15/14  Yes Eileen Stanford, PA-C  clopidogrel (PLAVIX) 75 MG tablet Take 75 mg by mouth daily with breakfast. 04/17/14  Yes Historical Provider, MD  DULoxetine (CYMBALTA) 60 MG capsule Take 1 capsule (60 mg total) by mouth 2 (two) times daily. 04/30/14  Yes Melvenia Beam, MD  ferrous sulfate 325 (65 FE) MG tablet Take 325 mg by mouth 2 (two) times daily.     Yes Historical Provider, MD  insulin glargine (LANTUS) 100 UNIT/ML injection Inject 60 Units into the skin at bedtime.    Yes Historical Provider, MD  insulin lispro (HUMALOG) 100 UNIT/ML injection Inject 15 Units into the skin 3 (three) times daily  before meals.    Yes Historical Provider, MD  metoCLOPramide (REGLAN) 10 MG tablet Take 10 mg by mouth 3 (three) times daily before meals. One po qac 09/06/13  Yes Historical Provider, MD  metolazone (ZAROXOLYN) 2.5 MG tablet Take 1 tablet (2.5 mg total) by mouth 2 (two) times a week. Every Mon and Fri 04/30/14  Yes Larey Dresser, MD  potassium chloride SA (K-DUR,KLOR-CON) 20 MEQ tablet Take 2 tablets (40 mEq total) by mouth 2 (two) times daily. On Mondays take an additional 40 MeQ daily 04/22/14  Yes Jolaine Artist, MD  Prenatal Vit-Fe Fumarate-FA (PRENATAL MULTIVITAMIN) TABS tablet Take 1 tablet by mouth daily at 12 noon.   Yes Historical Provider, MD  torsemide (DEMADEX) 20 MG tablet Take 5 tablets (100 mg total) by mouth 2 (two) times daily. 04/30/14  Yes Larey Dresser, MD  albuterol (PROVENTIL HFA;VENTOLIN HFA) 108 (90 BASE) MCG/ACT inhaler Inhale 2 puffs into the lungs every 6 (six) hours as needed for wheezing. 10/25/12   Amy D Clegg, NP  capsicum (ZOSTRIX) 0.075 % topical cream Apply 1 application topically 3 (three) times daily. Apply to feet up to 3x daily Patient not taking: Reported on 04/30/2014 10/03/13   Melvenia Beam, MD  glucose blood test strip Check glucose TID and prn. 12/13/13 12/13/14  Historical Provider, MD  HYDROcodone-acetaminophen (NORCO) 5-325 MG per tablet Take 1 tablet by mouth every 6 (six) hours as needed for moderate pain. Patient not taking: Reported on 04/30/2014 04/13/13   Velvet Bathe, MD  nitroGLYCERIN (NITROSTAT) 0.4 MG SL tablet Place 1 tablet (0.4 mg total) under the tongue every 5 (five) minutes as needed for chest pain. 04/15/14   Eileen Stanford, PA-C    Past Medical History: Past Medical History  Diagnosis Date  . Diastolic heart failure     a. 2D ECHO on 04/11/2014 w/ EF: 55- 60%, mild LVH. No RWMA, G2DD, mild AS, MIld MR, mild LAE  . Hypertensive heart disease   . Hyperlipidemia   . Anemia   . Morbid obesity   . Coronary artery disease      a.  Cath 2010: non obstructive CAD (30% LAD proximal, mid and distal lesions, 60% diagonal, 30% Dominant mid circumflex) b. Lexiscan Myoview (05/2013) Fixed anteroapical scar of medium sixe, no reversible ischemia  . Bipolar 1 disorder   . Carpal tunnel syndrome   . Abscess of anal and rectal regions   . Hypertension   . OSA on CPAP   . Diabetic gastroparesis   . Diabetic nephropathy   . Chronic kidney disease (CKD), stage IV (severe)   . Diabetes mellitus type 1   . Chronic diastolic CHF (congestive heart failure)   . Aortic stenosis, mild     Past Surgical History: Past  Surgical History  Procedure Laterality Date  . Carpal tunnel release Bilateral 2003  . Laparoscopic cholecystectomy  ~ 2010  . Cardiac catheterization  2010  . Incision and drainage abscess anal  2015    Family History: Family History  Problem Relation Age of Onset  . Heart attack Mother   . Dementia Father     Social History: History   Social History  . Marital Status: Single    Spouse Name: N/A  . Number of Children: 0  . Years of Education: BA   Occupational History  . [employer] [place] [employer]   Social History Main Topics  . Smoking status: Never Smoker   . Smokeless tobacco: Never Used  . Alcohol Use: Yes     Comment: 04/10/2014 "I'll have a drink once/3 months, if that"  . Drug Use: No  . Sexual Activity: Yes    Birth Control/ Protection: IUD   Other Topics Concern  . None   Social History Narrative   Patient is single with no children.   Patient lives with boyfriend.   Patient is right handed.   Patient has Bachelor's degree.   Patient drinks 1 large cup daily.    Allergies:  Allergies  Allergen Reactions  . Contrast Media [Iodinated Diagnostic Agents] Nausea And Vomiting  . Paroxetine Nausea And Vomiting  . Oxycodone Itching and Rash    Objective:    Vital Signs:   Temp:  [98.1 F (36.7 C)] 98.1 F (36.7 C) (04/07 0547) Pulse Rate:  [79-94] 79 (04/07 1235) Resp:   [18-28] 22 (04/07 1235) BP: (151-166)/(49-72) 152/65 mmHg (04/07 1200) SpO2:  [92 %-98 %] 96 % (04/07 1235) Weight:  [306 lb 8 oz (139.027 kg)] 306 lb 8 oz (139.027 kg) (04/07 0603)    Weight change: Filed Weights   05/02/14 0603  Weight: 306 lb 8 oz (139.027 kg)    Intake/Output:   Intake/Output Summary (Last 24 hours) at 05/02/14 1444 Last data filed at 05/02/14 0852  Gross per 24 hour  Intake      0 ml  Output   1090 ml  Net  -1090 ml  PHYSICAL EXAM: General:  No resp difficulty. In bed  HEENT: normal Neck: supple. JVP 12 cm. Carotids 2+ bilaterally; no bruits. No lymphadenopathy or thryomegaly appreciated. Cor: PMI normal. Regular rate & rhythm. No rubs, gallops. 2/6 early SEM RUSB.  Lungs: clear Abdomen: obese, soft, nontender, +distended. No hepatosplenomegaly. No bruits or masses. Good bowel sounds. Extremities: no cyanosis, clubbing, rash. R and LLE 2+ edema to knees bilaterally.  Neuro: alert & orientedx3, cranial nerves grossly intact. Moves all 4 extremities w/o difficulty. Affect pleasa    Telemetry: SR 80s   Labs: Basic Metabolic Panel:  Recent Labs Lab 04/30/14 1600 05/02/14 0555 05/02/14 0945 05/02/14 1309  NA 134* 129* 131* 130*  K 3.8 3.8 3.7 3.8  CL 98 96 98 94*  CO2 21 19 18* 23  GLUCOSE 316* 611* 577* 294*  BUN 103* 103* 101* 102*  CREATININE 2.80* 2.85* 2.83* 2.70*  CALCIUM 8.3* 8.3* 8.3* 8.5  MG  --   --  2.2  --     Liver Function Tests: No results for input(s): AST, ALT, ALKPHOS, BILITOT, PROT, ALBUMIN in the last 168 hours. No results for input(s): LIPASE, AMYLASE in the last 168 hours. No results for input(s): AMMONIA in the last 168 hours.  CBC:  Recent Labs Lab 05/02/14 0555  WBC 13.6*  HGB 9.1*  HCT  28.1*  MCV 88.9  PLT 316    Cardiac Enzymes:  Recent Labs Lab 05/02/14 1309  TROPONINI <0.03    BNP: BNP (last 3 results)  Recent Labs  04/10/14 1040 04/22/14 1027 05/02/14 0555  BNP 331.1* 244.5*  382.7*    ProBNP (last 3 results)  Recent Labs  05/25/13 0545 12/03/13 0305  PROBNP 3258.0* 2451.0*     CBG:  Recent Labs Lab 05/02/14 0832 05/02/14 0935 05/02/14 1040 05/02/14 1129 05/02/14 1233  GLUCAP 550* 512* 486* 362* 247*    Coagulation Studies: No results for input(s): LABPROT, INR in the last 72 hours.  Other results:  Imaging: Dg Chest 2 View  05/02/2014   CLINICAL DATA:  Shortness of breath for a few days, bilateral lower extremity swelling. History of diastolic heart failure, morbid obesity, hypertension, diabetes, chronic kidney disease.  EXAM: CHEST  2 VIEW  COMPARISON:  Chest radiograph April 09, 2004  FINDINGS: The cardiac silhouette appears mildly enlarged, similar. Mediastinal silhouette is nonsuspicious. Interstitial prominence, patchy bibasilar airspace opacities. No pleural effusion. No pneumothorax. Large body habitus. Osseous structures are nonsuspicious.  IMPRESSION: Mild cardiomegaly. Interstitial prominence suggests pulmonary edema, confluent in the lung bases (less likely superimposed pneumonia).   Electronically Signed   By: Elon Alas   On: 05/02/2014 06:23      Medications:     Current Medications: . DULoxetine  60 mg Oral BID  . insulin aspart  0-20 Units Subcutaneous TID WC  . insulin glargine  30 Units Subcutaneous QHS  . metolazone  10 mg Oral Once     Infusions: . furosemide        Assessment/Plan    1. Acute on chronic diastolic CHF:  Echo 1/48 with EF 55-60%, mild AS, preserved RV.  Volume status elevated despite escalating diuretics regimen at home. Continue lasix 120 mg IV every 6 hours + 10 metolazone today.  Follow renal function closely.  2. CKD: Nephrology consulted. Nearing HD.   3. OSA: Severe, uses CPAP at home 4. Uncontrolled DM  Length of Stay: 0  CLEGG,AMY NP-C  05/02/2014, 2:44 PM  Advanced Heart Failure Team Pager [phone] (M-F; 7a - 4p)  Please contact [facility] for night-coverage  after hours (4p -7a ) and weekends on [email or web address]  Patient seen with NP, agree with the above note.  Difficult situation, progressive renal dysfunction with intractable volume overload.  Failing high dose diuretics as an outpatient, had Lasix 120 IV in the clinic yesterday without response, BUN now > 100.  Agree with Lasix 120 mg IV every 6 hrs + metolazone.  Nephrology to follow.   Loralie Champagne 05/02/2014 5:31 PM

## 2014-05-02 NOTE — ED Provider Notes (Addendum)
Medical screening examination/treatment/procedure(s) were conducted as a shared visit with non-physician practitioner(s) and myself.  I personally evaluated the patient during the encounter.   EKG Interpretation None      Patient seen by me. Patient with a history of renal disease diastolic congestive heart failure. Just seen by cardiology for an exacerbation of her congestive heart failure yesterday in the clinic was diuresis with a total of 120 mg of Lasix. Patient still short of breath. Today's workup to show a chest x-ray consistent with CHF. In addition patient's blood sugar is significantly elevated. Patient's oxygen status had been stable oxygen on 3 L nasal cannula with good sats in the 90s. BiPAP was not required. Patient given an additional 80 mg of Lasix. Discussed with cardiology will be a cardiology and medicine admission. Patient also given insulin to help bring down the elevated blood sugar. Patient currently feeling better less short of breath and when she arrived. But the shortness of breath is not completely resolved.   Results for orders placed or performed during the hospital encounter of 05/02/14  Basic metabolic panel  Result Value Ref Range   Sodium 129 (L) 135 - 145 mmol/L   Potassium 3.8 3.5 - 5.1 mmol/L   Chloride 96 96 - 112 mmol/L   CO2 19 19 - 32 mmol/L   Glucose, Bld 611 (HH) 70 - 99 mg/dL   BUN 409 (H) 6 - 23 mg/dL   Creatinine, Ser 8.11 (H) 0.50 - 1.10 mg/dL   Calcium 8.3 (L) 8.4 - 10.5 mg/dL   GFR calc non Af Amer 19 (L) >90 mL/min   GFR calc Af Amer 22 (L) >90 mL/min   Anion gap 14 5 - 15  CBC  Result Value Ref Range   WBC 13.6 (H) 4.0 - 10.5 K/uL   RBC 3.16 (L) 3.87 - 5.11 MIL/uL   Hemoglobin 9.1 (L) 12.0 - 15.0 g/dL   HCT 91.4 (L) 78.2 - 95.6 %   MCV 88.9 78.0 - 100.0 fL   MCH 28.8 26.0 - 34.0 pg   MCHC 32.4 30.0 - 36.0 g/dL   RDW 21.3 08.6 - 57.8 %   Platelets 316 150 - 400 K/uL  BNP (order ONLY if patient complains of dyspnea/SOB AND you have  documented it for THIS visit)  Result Value Ref Range   B Natriuretic Peptide 382.7 (H) 0.0 - 100.0 pg/mL  Urinalysis, Routine w reflex microscopic  Result Value Ref Range   Color, Urine YELLOW YELLOW   APPearance HAZY (A) CLEAR   Specific Gravity, Urine 1.012 1.005 - 1.030   pH 5.0 5.0 - 8.0   Glucose, UA 500 (A) NEGATIVE mg/dL   Hgb urine dipstick NEGATIVE NEGATIVE   Bilirubin Urine NEGATIVE NEGATIVE   Ketones, ur NEGATIVE NEGATIVE mg/dL   Protein, ur 469 (A) NEGATIVE mg/dL   Urobilinogen, UA 0.2 0.0 - 1.0 mg/dL   Nitrite NEGATIVE NEGATIVE   Leukocytes, UA TRACE (A) NEGATIVE  Urine microscopic-add on  Result Value Ref Range   Squamous Epithelial / LPF FEW (A) RARE   WBC, UA 11-20 <3 WBC/hpf   Bacteria, UA MANY (A) RARE  I-stat troponin, ED (not at [facility])  Result Value Ref Range   Troponin i, poc 0.00 0.00 - 0.08 ng/mL   Comment 3          CBG monitoring, ED  Result Value Ref Range   Glucose-Capillary 578 (HH) 70 - 99 mg/dL  I-Stat Venous Blood Gas, ED (order at [facility] and  [facility] only)  Result Value Ref Range   pH, Ven 7.400 (H) 7.250 - 7.300   pCO2, Ven 33.6 (L) 45.0 - 50.0 mmHg   pO2, Ven 32.0 30.0 - 45.0 mmHg   Bicarbonate 20.8 20.0 - 24.0 mEq/L   TCO2 22 0 - 100 mmol/L   O2 Saturation 63.0 %   Acid-base deficit 3.0 (H) 0.0 - 2.0 mmol/L   Sample type VENOUS    Comment NOTIFIED PHYSICIAN   CBG monitoring, ED  Result Value Ref Range   Glucose-Capillary 550 (H) 70 - 99 mg/dL   Dg Chest 2 View  1/6/10964/07/2014   CLINICAL DATA:  Shortness of breath for a few days, bilateral lower extremity swelling. History of diastolic heart failure, morbid obesity, hypertension, diabetes, chronic kidney disease.  EXAM: CHEST  2 VIEW  COMPARISON:  Chest radiograph April 09, 2004  FINDINGS: The cardiac silhouette appears mildly enlarged, similar. Mediastinal silhouette is nonsuspicious. Interstitial prominence, patchy bibasilar airspace opacities. No pleural effusion. No pneumothorax. Large body habitus.  Osseous structures are nonsuspicious.  IMPRESSION: Mild cardiomegaly. Interstitial prominence suggests pulmonary edema, confluent in the lung bases (less likely superimposed pneumonia).   Electronically Signed   By: Awilda Metroourtnay  Bloomer   On: 05/02/2014 06:23   Dg Chest Port 1 View  04/10/2014   CLINICAL DATA:  44 year old female with shortness of breath and abnormal pulmonary auscultation. Initial encounter.  EXAM: PORTABLE CHEST - 1 VIEW  COMPARISON:  12/03/2013 and earlier.  FINDINGS: Portable AP semi upright view at 1029 hours. Stable cardiac size and mediastinal contours. Mildly improved lung volumes. Basilar predominant but diffuse indistinct pulmonary opacity. This is less asymmetric than on the comparison. No pneumothorax, consolidation or pleural effusion identified.  IMPRESSION: Indistinct bilateral pulmonary opacity. Favor acute pulmonary edema over bilateral pneumonia.   Electronically Signed   By: Odessa FlemingH  Hall M.D.   On: 04/10/2014 10:53       Vanetta MuldersScott Roxine Whittinghill, MD 05/02/14 [id number]  Vanetta MuldersScott Terrall Bley, MD 05/02/14 [phone]

## 2014-05-02 NOTE — ED Notes (Signed)
From home via EMS, 3 days of SOB and leg swelling, CP with inspiration, CBG >600, other VSS,

## 2014-05-02 NOTE — ED Notes (Signed)
Report from morgan, rn.  Pt care assumed.  Pt tearful, c/o leg cramps.  Dr Virgina Organqureshi paged.

## 2014-05-02 NOTE — ED Provider Notes (Signed)
CSN: [id number]     Arrival date & time 05/02/14  0540 History   First MD Initiated Contact with Patient 05/02/14 [phone]     Chief Complaint  Patient presents with  . Shortness of Breath  . Hyperglycemia  . Leg Swelling     (Consider location/radiation/quality/duration/timing/severity/associated sxs/prior Treatment) HPI Comments: Patient with a history of Diastolic CHF, Renal Disease, DM type I presents today with SOB.  She reports that she has been short of breath for the past 3 days, which is progressively worsening.  She also reports that she has had some LE edema.  She saw Cardiology in the office 2 days ago.  She was ordered 120 mg IV Lasix, which she received in the office yesterday.  However, she does not feel that the Lasix has helped.  She states that she is currently on Turosemide 100 mg BID, which was increased from 80 mg BID by Cardiology 2 days ago.  She reports that she does have some chest pain, but only with inspiration.  She was also found to be hyperglycemic by EMS with a blood sugar greater than 600.  She states that she last took her Insulin yesterday morning.  She reports associated dry cough.  She reports that she has gained 20 lbs over the past 3 weeks.  She has also noticed increased LE edema bilaterally.  She denies fever, chills, nausea, vomiting, abdominal pain, LE pain, hemoptysis, numbness, and tingling.  She denies history of DVT or PE.  Denies prolonged travel or surgeries in the past 4 weeks.  Denies exogenous estrogen use.  Patient is a 44 y.o. female presenting with shortness of breath and hyperglycemia. The history is provided by the patient.  Shortness of Breath Hyperglycemia Associated symptoms: shortness of breath     Past Medical History  Diagnosis Date  . Diastolic heart failure     a. 2D ECHO on 04/11/2014 w/ EF: 55- 60%, mild LVH. No RWMA, G2DD, mild AS, MIld MR, mild LAE  . Hypertensive heart disease   . Hyperlipidemia   . Anemia   . Morbid obesity    . Coronary artery disease      a. Cath 2010: non obstructive CAD (30% LAD proximal, mid and distal lesions, 60% diagonal, 30% Dominant mid circumflex) b. Lexiscan Myoview (05/2013) Fixed anteroapical scar of medium sixe, no reversible ischemia  . Bipolar 1 disorder   . Carpal tunnel syndrome   . Abscess of anal and rectal regions   . Hypertension   . OSA on CPAP   . Diabetic gastroparesis   . Diabetic nephropathy   . Chronic kidney disease (CKD), stage IV (severe)   . Diabetes mellitus type 1   . Chronic diastolic CHF (congestive heart failure)   . Aortic stenosis, mild    Past Surgical History  Procedure Laterality Date  . Carpal tunnel release Bilateral 2003  . Laparoscopic cholecystectomy  ~ 2010  . Cardiac catheterization  2010  . Incision and drainage abscess anal  2015   Family History  Problem Relation Age of Onset  . Heart attack Mother   . Dementia Father    History  Substance Use Topics  . Smoking status: Never Smoker   . Smokeless tobacco: Never Used  . Alcohol Use: Yes     Comment: 04/10/2014 "I'll have a drink once/3 months, if that"   OB History    No data available     Review of Systems  Respiratory: Positive for shortness of breath.  All other systems reviewed and are negative.     Allergies  Contrast media; Paroxetine; and Oxycodone  Home Medications   Prior to Admission medications   Medication Sig Start Date End Date Taking? Authorizing Provider  acetaminophen (TYLENOL) 500 MG tablet Take 500 mg by mouth every 6 (six) hours as needed for mild pain.    Historical Provider, MD  albuterol (PROVENTIL HFA;VENTOLIN HFA) 108 (90 BASE) MCG/ACT inhaler Inhale 2 puffs into the lungs every 6 (six) hours as needed for wheezing. 10/25/12   Amy D Clegg, NP  amLODipine (NORVASC) 10 MG tablet Take 10 mg by mouth daily.    Historical Provider, MD  aspirin 81 MG tablet Take 81 mg by mouth daily. For pain    Historical Provider, MD  atorvastatin (LIPITOR) 80 MG  tablet Take 1 tablet (80 mg total) by mouth daily at 6 PM. 05/30/13   Erline Hau, MD  Blood Glucose Monitoring Suppl (ONE TOUCH ULTRA 2) W/DEVICE KIT Check CBG's TID and prn 12/13/13   Historical Provider, MD  capsicum (ZOSTRIX) 0.075 % topical cream Apply 1 application topically 3 (three) times daily. Apply to feet up to 3x daily Patient not taking: Reported on 04/30/2014 10/03/13   Melvenia Beam, MD  carvedilol (COREG) 6.25 MG tablet TAKE 2 TABLETS (12.5 MG TOTAL) BY MOUTH 2 (TWO) TIMES DAILY WITH A MEAL. 04/15/14   Eileen Stanford, PA-C  DULoxetine (CYMBALTA) 60 MG capsule Take 1 capsule (60 mg total) by mouth 2 (two) times daily. 04/30/14   Melvenia Beam, MD  ferrous sulfate 325 (65 FE) MG tablet Take 325 mg by mouth 2 (two) times daily.      Historical Provider, MD  glucose blood test strip Check glucose TID and prn. 12/13/13 12/13/14  Historical Provider, MD  HYDROcodone-acetaminophen (NORCO) 5-325 MG per tablet Take 1 tablet by mouth every 6 (six) hours as needed for moderate pain. Patient not taking: Reported on 04/30/2014 04/13/13   Velvet Bathe, MD  insulin glargine (LANTUS) 100 UNIT/ML injection Inject 60 Units into the skin at bedtime.     Historical Provider, MD  insulin lispro (HUMALOG) 100 UNIT/ML injection Inject 15 Units into the skin 3 (three) times daily before meals.     Historical Provider, MD  metoCLOPramide (REGLAN) 10 MG tablet Take 10 mg by mouth 3 (three) times daily before meals. One po qac 09/06/13   Historical Provider, MD  metolazone (ZAROXOLYN) 2.5 MG tablet Take 1 tablet (2.5 mg total) by mouth 2 (two) times a week. Every Mon and Fri 04/30/14   Larey Dresser, MD  nitroGLYCERIN (NITROSTAT) 0.4 MG SL tablet Place 1 tablet (0.4 mg total) under the tongue every 5 (five) minutes as needed for chest pain. 04/15/14   Eileen Stanford, PA-C  potassium chloride SA (K-DUR,KLOR-CON) 20 MEQ tablet Take 2 tablets (40 mEq total) by mouth 2 (two) times daily. On Mondays take  an additional 40 MeQ daily 04/22/14   Jolaine Artist, MD  Prenatal Vit-Fe Fumarate-FA (PRENATAL MULTIVITAMIN) TABS tablet Take 1 tablet by mouth daily at 12 noon.    Historical Provider, MD  torsemide (DEMADEX) 20 MG tablet Take 5 tablets (100 mg total) by mouth 2 (two) times daily. 04/30/14   Larey Dresser, MD   BP 161/72 mmHg  Pulse 89  Temp(Src) 98.1 F (36.7 C) (Oral)  Resp 18  Ht _0  (1.702 m)  Wt 306 lb 8 oz (139.027 kg)  BMI 47.99 kg/m2  SpO2 96% Physical Exam  Constitutional: She appears well-developed and well-nourished.  HENT:  Head: Normocephalic and atraumatic.  Mouth/Throat: Oropharynx is clear and moist.  Neck: Normal range of motion. Neck supple.  Cardiovascular: Normal rate, regular rhythm and normal heart sounds.   Pulmonary/Chest: Effort normal. She has rales.  Crackles at the bases of both lungs  Abdominal: Soft. Bowel sounds are normal.  Musculoskeletal: Normal range of motion.  2+ pitting edema of LLE, 1+pitting edema of RLE  Neurological: She is alert.  Skin: Skin is warm and dry.  Psychiatric: She has a normal mood and affect.  Nursing note and vitals reviewed.   ED Course  Procedures (including critical care time) Labs Review Labs Reviewed  CBG MONITORING, ED - Abnormal; Notable for the following:    Glucose-Capillary 578 (*)    All other components within normal limits  I-STAT VENOUS BLOOD GAS, ED - Abnormal; Notable for the following:    pH, Ven 7.400 (*)    pCO2, Ven 33.6 (*)    Acid-base deficit 3.0 (*)    All other components within normal limits  BASIC METABOLIC PANEL  CBC  BRAIN NATRIURETIC PEPTIDE  PROTIME-INR  URINALYSIS, ROUTINE W REFLEX MICROSCOPIC  BLOOD GAS, VENOUS  I-STAT TROPOININ, ED    Imaging Review Dg Chest 2 View  05/02/2014   CLINICAL DATA:  Shortness of breath for a few days, bilateral lower extremity swelling. History of diastolic heart failure, morbid obesity, hypertension, diabetes, chronic kidney disease.   EXAM: CHEST  2 VIEW  COMPARISON:  Chest radiograph April 09, 2004  FINDINGS: The cardiac silhouette appears mildly enlarged, similar. Mediastinal silhouette is nonsuspicious. Interstitial prominence, patchy bibasilar airspace opacities. No pleural effusion. No pneumothorax. Large body habitus. Osseous structures are nonsuspicious.  IMPRESSION: Mild cardiomegaly. Interstitial prominence suggests pulmonary edema, confluent in the lung bases (less likely superimposed pneumonia).   Electronically Signed   By: Elon Alas   On: 05/02/2014 06:23     EKG Interpretation None     CRITICAL CARE Performed by: Hyman Bible   Total critical care time: 30  Critical care time was exclusive of separately billable procedures and treating other patients.  Critical care was necessary to treat or prevent imminent or life-threatening deterioration.  Critical care was time spent personally by me on the following activities: development of treatment plan with patient and/or surrogate as well as nursing, discussions with consultants, evaluation of patient's response to treatment, examination of patient, obtaining history from patient or surrogate, ordering and performing treatments and interventions, ordering and review of laboratory studies, ordering and review of radiographic studies, pulse oximetry and re-evaluation of patient's condition.  MDM   Final diagnoses:  SOB (shortness of breath)   Patient with a history of DM type I, Stage IV CKD, and Diastolic Heart Failure presents today with SOB and hyperglycemia.  CXR showing pulmonary edema.  BNP also elevated.  Symptoms most consistent with an exacerbation of her heart failure.  She was seen by Cardiology and given 120 mg IV Lasix yesterday, with no improvement.  Patient given IV Lasix in the ED and also started on the [facility] for Hyperglycemia.  No ketones in the Urine and Anion Gap WNL.  Do not feel that the patient is in DKA.  Cardiology  consulted and recommended admission to medicine service.  Patient admitted to Internal Medicine Teaching Service for further management.     Hyman Bible, PA-C 05/04/14 2053  Everlene Balls, MD 05/05/14 2045

## 2014-05-02 NOTE — Consult Note (Signed)
Referring Provider: No ref. provider found Primary Care Physician:  Jasmine Obey, MD Primary Nephrologist:  Dr. Marval Regal  Reason for Consultation:  Known patient with Chronic renal disease secondary to diabetic nephropathy that presented with increased edema and weight that has been increasing for the last 3 weeks. She has pain in her legs and difficulty walking with ability to climb 1 -2 flights of steps HPI: 44 year old woman with history of chronic diastolic heart failure, hypertension, hyperlipidemia, coronary artery disease, CAD, and other problems as outlined in the medical history, admitted with complaint of worsening shortness of breath and lower extremity edema. Patient was followed in the heart failure clinic at [facility], and received IV Lasix on 05/01/2014 there. She reports worsening of her shortness of breath last night which prompted her to come to the emergency department.Chronically low GFR less than 20cc/min. There has been no use in NSAIDS. There is no family history of renal disease She works for the post [employer] and last worked Monday this week. She has worked for the [employer] for 21 years and is married to her husband who also is a Tour manager Past Medical History  Diagnosis Date  . Diastolic heart failure     a. 2D ECHO on 04/11/2014 w/ EF: 55- 60%, mild LVH. No RWMA, G2DD, mild AS, MIld MR, mild LAE  . Hypertensive heart disease   . Hyperlipidemia   . Anemia   . Morbid obesity   . Coronary artery disease      a. Cath 2010: non obstructive CAD (30% LAD proximal, mid and distal lesions, 60% diagonal, 30% Dominant mid circumflex) b. Lexiscan Myoview (05/2013) Fixed anteroapical scar of medium sixe, no reversible ischemia  . Bipolar 1 disorder   . Carpal tunnel syndrome   . Abscess of anal and rectal regions   . Hypertension   . OSA on CPAP   . Diabetic gastroparesis   . Diabetic nephropathy   . Chronic kidney disease (CKD), stage IV (severe)   . Diabetes mellitus  type 1   . Chronic diastolic CHF (congestive heart failure)   . Aortic stenosis, mild     Past Surgical History  Procedure Laterality Date  . Carpal tunnel release Bilateral 2003  . Laparoscopic cholecystectomy  ~ 2010  . Cardiac catheterization  2010  . Incision and drainage abscess anal  2015    Prior to Admission medications   Medication Sig Start Date End Date Taking? Authorizing Provider  acetaminophen (TYLENOL) 500 MG tablet Take 500 mg by mouth every 6 (six) hours as needed for mild pain.   Yes Historical Provider, MD  amLODipine (NORVASC) 10 MG tablet Take 10 mg by mouth daily.   Yes Historical Provider, MD  aspirin 81 MG tablet Take 81 mg by mouth daily. For pain   Yes Historical Provider, MD  atorvastatin (LIPITOR) 80 MG tablet Take 1 tablet (80 mg total) by mouth daily at 6 PM. 05/30/13  Yes Estela Leonie Green, MD  Blood Glucose Monitoring Suppl (ONE TOUCH ULTRA 2) W/DEVICE KIT Check CBG's TID and prn 12/13/13  Yes Historical Provider, MD  carvedilol (COREG) 6.25 MG tablet TAKE 2 TABLETS (12.5 MG TOTAL) BY MOUTH 2 (TWO) TIMES DAILY WITH A MEAL. 04/15/14  Yes Eileen Stanford, PA-C  clopidogrel (PLAVIX) 75 MG tablet Take 75 mg by mouth daily with breakfast. 04/17/14  Yes Historical Provider, MD  DULoxetine (CYMBALTA) 60 MG capsule Take 1 capsule (60 mg total) by mouth 2 (two) times daily. 04/30/14  Yes Melvenia Beam, MD  ferrous sulfate 325 (65 FE) MG tablet Take 325 mg by mouth 2 (two) times daily.     Yes Historical Provider, MD  insulin glargine (LANTUS) 100 UNIT/ML injection Inject 60 Units into the skin at bedtime.    Yes Historical Provider, MD  insulin lispro (HUMALOG) 100 UNIT/ML injection Inject 15 Units into the skin 3 (three) times daily before meals.    Yes Historical Provider, MD  metoCLOPramide (REGLAN) 10 MG tablet Take 10 mg by mouth 3 (three) times daily before meals. One po qac 09/06/13  Yes Historical Provider, MD  metolazone (ZAROXOLYN) 2.5 MG tablet Take  1 tablet (2.5 mg total) by mouth 2 (two) times a week. Every Mon and Fri 04/30/14  Yes Larey Dresser, MD  potassium chloride SA (K-DUR,KLOR-CON) 20 MEQ tablet Take 2 tablets (40 mEq total) by mouth 2 (two) times daily. On Mondays take an additional 40 MeQ daily 04/22/14  Yes Jolaine Artist, MD  Prenatal Vit-Fe Fumarate-FA (PRENATAL MULTIVITAMIN) TABS tablet Take 1 tablet by mouth daily at 12 noon.   Yes Historical Provider, MD  torsemide (DEMADEX) 20 MG tablet Take 5 tablets (100 mg total) by mouth 2 (two) times daily. 04/30/14  Yes Larey Dresser, MD  albuterol (PROVENTIL HFA;VENTOLIN HFA) 108 (90 BASE) MCG/ACT inhaler Inhale 2 puffs into the lungs every 6 (six) hours as needed for wheezing. 10/25/12   Amy D Clegg, NP  capsicum (ZOSTRIX) 0.075 % topical cream Apply 1 application topically 3 (three) times daily. Apply to feet up to 3x daily Patient not taking: Reported on 04/30/2014 10/03/13   Melvenia Beam, MD  glucose blood test strip Check glucose TID and prn. 12/13/13 12/13/14  Historical Provider, MD  HYDROcodone-acetaminophen (NORCO) 5-325 MG per tablet Take 1 tablet by mouth every 6 (six) hours as needed for moderate pain. Patient not taking: Reported on 04/30/2014 04/13/13   Velvet Bathe, MD  nitroGLYCERIN (NITROSTAT) 0.4 MG SL tablet Place 1 tablet (0.4 mg total) under the tongue every 5 (five) minutes as needed for chest pain. 04/15/14   Eileen Stanford, PA-C    Current Facility-Administered Medications  Medication Dose Route Frequency Provider Last Rate Last Dose  . albuterol (PROVENTIL) (2.5 MG/3ML) 0.083% nebulizer solution 2.5 mg  2.5 mg Inhalation Q6H PRN Wilber Oliphant, MD      . aspirin EC tablet 81 mg  81 mg Oral Daily Wilber Oliphant, MD      . atorvastatin (LIPITOR) tablet 80 mg  80 mg Oral q1800 Wilber Oliphant, MD      . carvedilol (COREG) tablet 12.5 mg  12.5 mg Oral BID WC Wilber Oliphant, MD      . Derrill Memo ON 05/03/2014] Chlorhexidine Gluconate Cloth 2 % PADS 6 each  6  each Topical Q0600 Bertha Stakes, MD      . Derrill Memo ON 05/03/2014] clopidogrel (PLAVIX) tablet 75 mg  75 mg Oral Q breakfast Wilber Oliphant, MD      . DULoxetine (CYMBALTA) DR capsule 60 mg  60 mg Oral BID Wilber Oliphant, MD      . ferrous sulfate tablet 325 mg  325 mg Oral BID Wilber Oliphant, MD      . furosemide (LASIX) 160 mg in dextrose 5 % 50 mL IVPB  160 mg Intravenous Q6H Amy D Clegg, NP      . HYDROcodone-acetaminophen (NORCO/VICODIN) 5-325 MG per tablet 1 tablet  1 tablet Oral Q6H PRN  Wilber Oliphant, MD   1 tablet at 05/02/14 1143  . insulin aspart (novoLOG) injection 0-20 Units  0-20 Units Subcutaneous TID WC Marjan Rabbani, MD      . insulin glargine (LANTUS) injection 30 Units  30 Units Subcutaneous QHS Marjan Rabbani, MD      . metoCLOPramide (REGLAN) tablet 10 mg  10 mg Oral TID AC Wilber Oliphant, MD      . metolazone (ZAROXOLYN) tablet 10 mg  10 mg Oral Once Amy D Clegg, NP      . mupirocin ointment (BACTROBAN) 2 % 1 application  1 application Nasal BID Bertha Stakes, MD      . nitroGLYCERIN (NITROSTAT) SL tablet 0.4 mg  0.4 mg Sublingual Q5 min PRN Wilber Oliphant, MD      . Derrill Memo ON 05/03/2014] prenatal multivitamin tablet 1 tablet  1 tablet Oral Q1200 Wilber Oliphant, MD      . sodium chloride 0.9 % injection 3 mL  3 mL Intravenous Q12H Wilber Oliphant, MD        Allergies as of 05/02/2014 - Review Complete 05/02/2014  Allergen Reaction Noted  . Contrast media [iodinated diagnostic agents] Nausea And Vomiting 11/09/2010  . Paroxetine Nausea And Vomiting   . Oxycodone Itching and Rash 11/20/2010    Family History  Problem Relation Age of Onset  . Heart attack Mother   . Dementia Father     History   Social History  . Marital Status: Single    Spouse Name: N/A  . Number of Children: 0  . Years of Education: BA   Occupational History  . [employer] [place] [employer]   Social History Main Topics  . Smoking status: Never Smoker   . Smokeless tobacco:  Never Used  . Alcohol Use: Yes     Comment: 04/10/2014 "I'll have a drink once/3 months, if that"  . Drug Use: No  . Sexual Activity: Yes    Birth Control/ Protection: IUD   Other Topics Concern  . Not on file   Social History Narrative   Patient is single with no children.   Patient lives with boyfriend.   Patient is right handed.   Patient has Bachelor's degree.   Patient drinks 1 large cup daily.    Review of Systems: Gen: Denies any fever, chills, sweats, anorexia, + fatigue, + weakness, +  malaise, + weight gain 20lbs   HEENT: No visual complaints, No history of Retinopathy. Normal external appearance No Epistaxis or Sore throat. No sinusitis.   CV: Denies chest pain, angina, palpitations, syncope, +  Orthopnea,+ PND,+ peripheral edema Resp: Denies dyspnea at rest, + dyspnea with exercise,  No cough, sputum, wheezing, coughing up blood, and pleurisy. GI: Denies vomiting blood, jaundice, and fecal incontinence.   Denies dysphagia or odynophagia. GU : Denies urinary burning, blood in urine, urinary frequency, urinary hesitancy, nocturnal urination, and urinary incontinence.  No renal calculi. MS: Denies joint pain, limitation of movement, and swelling, stiffness, low back pain, extremity pain. Denies muscle weakness, cramps, atrophy.  No use of non steroidal antiinflammatory drugs. Derm: Denies rash, itching, dry skin, hives, moles, warts, or unhealing ulcers.  Psych: Denies depression, anxiety, memory loss, suicidal ideation, hallucinations, paranoia, and confusion. Heme: Denies bruising, bleeding, and enlarged lymph nodes. Neuro: No headache.  No diplopia. No dysarthria.  No dysphasia.  No history of CVA.  No Seizures. No paresthesias.  No weakness. Endocrine Diabetes .  No Thyroid disease.  No Adrenal  disease.  Physical Exam: Vital signs in last 24 hours: Temp:  [97.4 F (36.3 C)-98.1 F (36.7 C)] 97.4 F (36.3 C) (04/07 1519) Pulse Rate:  [79-94] 85 (04/07 1519) Resp:   [18-28] 20 (04/07 1519) BP: (135-166)/(49-72) 160/70 mmHg (04/07 1519) SpO2:  [92 %-98 %] 97 % (04/07 1519) Weight:  [136.896 kg (301 lb 12.8 oz)-139.027 kg (306 lb 8 oz)] 136.896 kg (301 lb 12.8 oz) (04/07 1519) Last BM Date: 04/30/14 General: Obese lady but generalized puffyness no periorbital edema Head:  Normocephalic and atraumatic. Eyes:  Sclera clear, no icterus.   Conjunctiva pink. Ears:  Normal auditory acuity. Nose:  No deformity, discharge,  or lesions. Mouth:  No deformity or lesions, dentition normal. Neck:  Supple; no masses or thyromegaly. JVP not elevated Lungs:  Clear throughout to auscultation.   No wheezes, crackles, or rhonchi. No acute distress. Heart:  Regular rate and rhythm; no murmurs, clicks, rubs,  or gallops. Abdomen:  Soft, nontender and nondistended. No masses, hepatosplenomegaly or hernias noted. Normal bowel sounds, without guarding, and without rebound.   Msk:  Symmetrical without gross deformities. Normal posture. Pulses:  No carotid, renal, femoral bruits. DP and PT symmetrical and equal Extremities:  2 + edema Neurologic:  Alert and  oriented x4;  grossly normal neurologically. Skin:  Intact without significant lesions or rashes. Cervical Nodes:  No significant cervical adenopathy. Psych:  Alert and cooperative. Normal mood and affect.  Intake/Output from previous day: 04/06 0701 - 04/07 0700 In: -  Out: 500 [Urine:500] Intake/Output this shift: Total I/O In: -  Out: 590 [Urine:590]  Lab Results:  Recent Labs  05/02/14 0555  WBC 13.6*  HGB 9.1*  HCT 28.1*  PLT 316   BMET  Recent Labs  05/02/14 0555 05/02/14 0945 05/02/14 1309  NA 129* 131* 130*  K 3.8 3.7 3.8  CL 96 98 94*  CO2 19 18* 23  GLUCOSE 611* 577* 294*  BUN 103* 101* 102*  CREATININE 2.85* 2.83* 2.70*  CALCIUM 8.3* 8.3* 8.5   LFT No results for input(s): PROT, ALBUMIN, AST, ALT, ALKPHOS, BILITOT, BILIDIR, IBILI in the last 72 hours. PT/INR No results for  input(s): LABPROT, INR in the last 72 hours. Hepatitis Panel No results for input(s): HEPBSAG, HCVAB, HEPAIGM, HEPBIGM in the last 72 hours.  Studies/Results: Dg Chest 2 View  05/02/2014   CLINICAL DATA:  Shortness of breath for a few days, bilateral lower extremity swelling. History of diastolic heart failure, morbid obesity, hypertension, diabetes, chronic kidney disease.  EXAM: CHEST  2 VIEW  COMPARISON:  Chest radiograph April 09, 2004  FINDINGS: The cardiac silhouette appears mildly enlarged, similar. Mediastinal silhouette is nonsuspicious. Interstitial prominence, patchy bibasilar airspace opacities. No pleural effusion. No pneumothorax. Large body habitus. Osseous structures are nonsuspicious.  IMPRESSION: Mild cardiomegaly. Interstitial prominence suggests pulmonary edema, confluent in the lung bases (less likely superimposed pneumonia).   Electronically Signed   By: Elon Alas   On: 05/02/2014 06:23    Assessment/Plan:  This is a complicated picture of a lady with obesity and pulmonary hypoventilation as well as congestive heart failure who has presented with anasarca that is not responsive to diuretics and a 20 lb weight gain in 3 weeks.   Volume excess : we shall increase diuretics IV lasix and metolazone and see if we can promote a diuresis  Anemia check iron stores   Bones check PTH   LOS: 0 Melvina Pangelinan W $RemoveBefor'@[email or web address]'[email or web address]$ '@4'$ :20 PM

## 2014-05-02 NOTE — H&P (Signed)
Date: 05/02/2014               Patient Name:  Jasmine [name] MRN: [id number]  DOB: [birth year] Age / Sex: 44 y.o., female   PCP: Katherina Mires, MD         Medical Service: Internal Medicine Teaching Service         Attending Physician: Dr. Bertha Stakes, MD    First Contact: Dr. Raelene Bott Pager: [phone]  Second Contact: Dr. Naaman Plummer Pager: [phone]       After Hours (After 5p/  First Contact Pager: [phone]  weekends / holidays): Second Contact Pager: [phone]   Chief Complaint: Shortness of Breath; Hyperglycemia; and Leg Swelling  History of Present Illness:   Patient is a 44 year old female with history of congestive heart failure, type 2 diabetes, chronic kidney disease, coronary artery disease, hypertension, objective sleep apnea, normocytic anemia who presents with a several week history of progressive dyspnea, peripheral edema, and weight gain. Patient states that she has not had any changes in her diet. She has noted some oliguria over this time period, and states that she is compliant with her home regimen of torsemide and metolazone as needed. Patient was seen in heart failure clinic the day prior to presentation and was given 120 mg of IV Lasix. After this, patient noted some decent diuresis through the evening although came back to the hospital for persistent dyspnea. Patient also has concurrent chronic kidney disease and was advised in heart failure clinic to see her nephrologist.  Additionally, patient is reporting a dull, parasternal chest pain that started several days ago. She states that it is worse with deep inspiration although notes some exertional component when she walks long distances or up stairs. She also reports increased frequency of loose bowel movements having 2 a day over the last week. She denies any recent use of antibiotics. She denies any change in the color of her stool. She also reports left greater than right leg edema and denies any associated pain, recent travel,  or immobilization lately. Otherwise, patient denies any fevers, chills, abdominal pain, constipation, dysuria, or hematuria.  She does state that she missed several doses of her medications including her evening and morning doses of insulin prior to presentation. In the emergency department, patient received furosemide 80 mg IV, ceftriaxone 1 g, potassium chloride 40 mEq orally, and was started on an insulin infusion.  Meds:  (Not in a hospital admission) Current Facility-Administered Medications  Medication Dose Route Frequency Provider Last Rate Last Dose  . insulin regular (NOVOLIN R,HUMULIN R) 250 Units in sodium chloride 0.9 % 250 mL (1 Units/mL) infusion   Intravenous Continuous Hyman Bible, PA-C 9 mL/hr at 05/02/14 0938 9 Units/hr at 05/02/14 4163   Current Outpatient Prescriptions  Medication Sig Dispense Refill  . acetaminophen (TYLENOL) 500 MG tablet Take 500 mg by mouth every 6 (six) hours as needed for mild pain.    Marland Kitchen amLODipine (NORVASC) 10 MG tablet Take 10 mg by mouth daily.    Marland Kitchen aspirin 81 MG tablet Take 81 mg by mouth daily. For pain    . atorvastatin (LIPITOR) 80 MG tablet Take 1 tablet (80 mg total) by mouth daily at 6 PM. 30 tablet 2  . Blood Glucose Monitoring Suppl (ONE TOUCH ULTRA 2) W/DEVICE KIT Check CBG's TID and prn    . carvedilol (COREG) 6.25 MG tablet TAKE 2 TABLETS (12.5 MG TOTAL) BY MOUTH 2 (TWO) TIMES DAILY WITH A MEAL. 90 tablet 3  .  clopidogrel (PLAVIX) 75 MG tablet Take 75 mg by mouth daily with breakfast.  9  . DULoxetine (CYMBALTA) 60 MG capsule Take 1 capsule (60 mg total) by mouth 2 (two) times daily. 60 capsule 6  . ferrous sulfate 325 (65 FE) MG tablet Take 325 mg by mouth 2 (two) times daily.      . insulin glargine (LANTUS) 100 UNIT/ML injection Inject 60 Units into the skin at bedtime.     . insulin lispro (HUMALOG) 100 UNIT/ML injection Inject 15 Units into the skin 3 (three) times daily before meals.     . metoCLOPramide (REGLAN) 10 MG tablet  Take 10 mg by mouth 3 (three) times daily before meals. One po qac    . metolazone (ZAROXOLYN) 2.5 MG tablet Take 1 tablet (2.5 mg total) by mouth 2 (two) times a week. Every Mon and Fri 12 tablet 3  . potassium chloride SA (K-DUR,KLOR-CON) 20 MEQ tablet Take 2 tablets (40 mEq total) by mouth 2 (two) times daily. On Mondays take an additional 40 MeQ daily 120 tablet 6  . Prenatal Vit-Fe Fumarate-FA (PRENATAL MULTIVITAMIN) TABS tablet Take 1 tablet by mouth daily at 12 noon.    . torsemide (DEMADEX) 20 MG tablet Take 5 tablets (100 mg total) by mouth 2 (two) times daily. 300 tablet 3  . albuterol (PROVENTIL HFA;VENTOLIN HFA) 108 (90 BASE) MCG/ACT inhaler Inhale 2 puffs into the lungs every 6 (six) hours as needed for wheezing. 1 Inhaler 2  . capsicum (ZOSTRIX) 0.075 % topical cream Apply 1 application topically 3 (three) times daily. Apply to feet up to 3x daily (Patient not taking: Reported on 04/30/2014) 5 g 3  . glucose blood test strip Check glucose TID and prn.    Marland Kitchen HYDROcodone-acetaminophen (NORCO) 5-325 MG per tablet Take 1 tablet by mouth every 6 (six) hours as needed for moderate pain. (Patient not taking: Reported on 04/30/2014) 30 tablet 0  . nitroGLYCERIN (NITROSTAT) 0.4 MG SL tablet Place 1 tablet (0.4 mg total) under the tongue every 5 (five) minutes as needed for chest pain. 25 tablet 12    Past Medical History  Diagnosis Date  . Diastolic heart failure     a. 2D ECHO on 04/11/2014 w/ EF: 55- 60%, mild LVH. No RWMA, G2DD, mild AS, MIld MR, mild LAE  . Hypertensive heart disease   . Hyperlipidemia   . Anemia   . Morbid obesity   . Coronary artery disease      a. Cath 2010: non obstructive CAD (30% LAD proximal, mid and distal lesions, 60% diagonal, 30% Dominant mid circumflex) b. Lexiscan Myoview (05/2013) Fixed anteroapical scar of medium sixe, no reversible ischemia  . Bipolar 1 disorder   . Carpal tunnel syndrome   . Abscess of anal and rectal regions   . Hypertension   . OSA on  CPAP   . Diabetic gastroparesis   . Diabetic nephropathy   . Chronic kidney disease (CKD), stage IV (severe)   . Diabetes mellitus type 1   . Chronic diastolic CHF (congestive heart failure)   . Aortic stenosis, mild     Past Surgical History  Procedure Laterality Date  . Carpal tunnel release Bilateral 2003  . Laparoscopic cholecystectomy  ~ 2010  . Cardiac catheterization  2010  . Incision and drainage abscess anal  2015     Allergies: Allergies as of 05/02/2014 - Review Complete 05/02/2014  Allergen Reaction Noted  . Contrast media [iodinated diagnostic agents] Nausea And Vomiting 11/09/2010  .  Paroxetine Nausea And Vomiting   . Oxycodone Itching and Rash 11/20/2010    Family History  Problem Relation Age of Onset  . Heart attack Mother   . Dementia Father     History   Social History  . Marital Status: Single    Spouse Name: N/A  . Number of Children: 0  . Years of Education: BA   Occupational History  . [employer] [place] [place] [employer]   Social History Main Topics  . Smoking status: Never Smoker   . Smokeless tobacco: Never Used  . Alcohol Use: Yes     Comment: 04/10/2014 "I'll have a drink once/3 months, if that"  . Drug Use: No  . Sexual Activity: Yes    Birth Control/ Protection: IUD   Other Topics Concern  . Not on file   Social History Narrative   Patient is single with no children.   Patient lives with boyfriend.   Patient is right handed.   Patient has Bachelor's degree.   Patient drinks 1 large cup daily.     Review of Systems: All pertinent ROS as stated in HPI.   Physical Exam: Blood pressure 151/64, pulse 87, temperature 98.1 F (36.7 C), temperature source Oral, resp. rate 22, height $RemoveBe'5\' 7"'bohhHJFdC$  (1.702 m), weight 306 lb 8 oz (139.027 kg), SpO2 98 %. General: resting in bed, nasal cannula in place, satting well with oxygen turned off HEENT: PERRL, EOMI, no scleral icterus Cardiac: Regular rate and rhythm, 2/6 systolic murmur, no rubs  or gallops Pulm: Bibasilar rales, slight increased work of breathing Abd: soft, nontender, nondistended, BS present Ext: warm and well perfused, 1+ pitting edema bilateral lower extremities up to the level of the knee, left leg circumference greater than right, no cutaneous changes, no erythema, no ulcers Neuro: alert and oriented X3, cranial nerves II-XII grossly intact Skin: no rashes or lesions noted Psych: appropriate affect  Lab results: Basic Metabolic Panel:  Recent Labs  04/30/14 1600 05/02/14 0555  NA 134* 129*  K 3.8 3.8  CL 98 96  CO2 21 19  GLUCOSE 316* 611*  BUN 103* 103*  CREATININE 2.80* 2.85*  CALCIUM 8.3* 8.3*   Liver Function Tests: No results for input(s): AST, ALT, ALKPHOS, BILITOT, PROT, ALBUMIN in the last 72 hours. No results for input(s): LIPASE, AMYLASE in the last 72 hours. No results for input(s): AMMONIA in the last 72 hours. CBC:  Recent Labs  05/02/14 0555  WBC 13.6*  HGB 9.1*  HCT 28.1*  MCV 88.9  PLT 316   Cardiac Enzymes: No results for input(s): CKTOTAL, CKMB, CKMBINDEX, TROPONINI in the last 72 hours. BNP: No results for input(s): PROBNP in the last 72 hours. D-Dimer: No results for input(s): DDIMER in the last 72 hours. CBG:  Recent Labs  05/02/14 0554 05/02/14 0832 05/02/14 0935  GLUCAP 578* 550* 512*   Hemoglobin A1C: No results for input(s): HGBA1C in the last 72 hours. Fasting Lipid Panel: No results for input(s): CHOL, HDL, LDLCALC, TRIG, CHOLHDL, LDLDIRECT in the last 72 hours. Thyroid Function Tests: No results for input(s): TSH, T4TOTAL, FREET4, T3FREE, THYROIDAB in the last 72 hours. Anemia Panel: No results for input(s): VITAMINB12, FOLATE, FERRITIN, TIBC, IRON, RETICCTPCT in the last 72 hours. Coagulation: No results for input(s): LABPROT, INR in the last 72 hours. Urine Drug Screen: Drugs of Abuse  No results found for: LABOPIA, COCAINSCRNUR, LABBENZ, AMPHETMU, THCU, LABBARB  Alcohol Level: No results  for input(s): ETH in the last 72 hours.  Urinalysis:  Recent Labs  05/02/14 0605  COLORURINE YELLOW  LABSPEC 1.012  PHURINE 5.0  GLUCOSEU 500*  HGBUR NEGATIVE  BILIRUBINUR NEGATIVE  KETONESUR NEGATIVE  PROTEINUR 100*  UROBILINOGEN 0.2  NITRITE NEGATIVE  LEUKOCYTESUR TRACE*   Imaging results:  Dg Chest 2 View  05/02/2014   CLINICAL DATA:  Shortness of breath for a few days, bilateral lower extremity swelling. History of diastolic heart failure, morbid obesity, hypertension, diabetes, chronic kidney disease.  EXAM: CHEST  2 VIEW  COMPARISON:  Chest radiograph April 09, 2004  FINDINGS: The cardiac silhouette appears mildly enlarged, similar. Mediastinal silhouette is nonsuspicious. Interstitial prominence, patchy bibasilar airspace opacities. No pleural effusion. No pneumothorax. Large body habitus. Osseous structures are nonsuspicious.  IMPRESSION: Mild cardiomegaly. Interstitial prominence suggests pulmonary edema, confluent in the lung bases (less likely superimposed pneumonia).   Electronically Signed   By: Elon Alas   On: 05/02/2014 06:23    Other results: EKG Interpretation Ventricular rate 90, old anterior infarct, no concerning ST, T-wave abnormalities compared to prior from 04/10/2014.  Assessment & Plan by Problem: Principal Problem:   Acute on chronic diastolic congestive heart failure Active Problems:   Anemia, chronic disease   CKD (chronic kidney disease), stage IV   Gastroparesis   Morbid obesity   Diabetes mellitus type 2   OSA on CPAP   Hyperlipidemia   Aortic stenosis, mild   Hypertension   Coronary artery disease   Acute on chronic diastolic CHF (congestive heart failure)   Hyperglycemia due to type 2 diabetes mellitus   Patient is a 44 year old female with history of congestive heart failure, type 2 diabetes, chronic kidney disease, coronary artery disease, hypertension, objective sleep apnea, normocytic anemia who is admitted for acute  decompensated heart failure in the setting of concurrent hyperglycemia.  Acute on chronic congestive heart failure: Patient presenting with progressive weight gain, dyspnea, and peripheral edema over the last 3 weeks. Weight is increased to 306 compared to 289 from discharge weight from mid-March admission. Physical exam remarkable for bibasilar rales. Chest x-ray remarkable for interstitial prominence consistent with pulmonary edema. BNP somewhat stable from priors at 331.  Last echocardiogram from March 2016 showing an ejection fraction of 55-60% with grade 2 diastolic dysfunction. Patient states that she is slightly improved with some decent diuresis yesterday afternoon status post 120 mg Lasix IV. Patient satting well on room air upon exam. Chronic kidney disease likely a complicating factor although patient's creatinine is close to her baseline currently at 2.85. ACS is less likely given negative troponin and lack of EKG changes. Pulmonary embolism less likely given Wells score of 0. Patient with no history of COPD or smoking. Chest x-ray with no evidence of pneumonia. -Patient status post 80 mg of furosemide IV in the emergency department -Hold home torsemide and metolazone -Supplement oxygen as needed -Strict I's and O's, will re-dose furosemide based on net output. -Supplement potassium as needed -Supplement magnesium as needed -Heart failure team has been consulted.  Hyperglycemia in the setting of poorly controlled Type 2 diabetes: Patient presenting with a elevated blood glucose of 611 upon presentation. Patient states that she was noncompliant with her NovoLog and Lantus the night before and the morning of presentation. At home, patient is on Lantus 60 units daily at bedtime and 15 units NovoLog 3 times a day before meals. Fluid resuscitation at this point is complicated by her acute decompensated heart failure and chronic kidney disease. -Appreciate assistance from diabetes  coordinator -Continue insulin drip for  now. -Will convert back to subcutaneous insulin once patient drops back down to 300s for glucose levels.  Chronic kidney disease: Patient's creatinine currently at baseline at 2.85, though GFR is 22. She follows with nephrology with Dr. Arty Baumgartner as an outpatient.  -Continue to monitor  Nonobstructive coronary artery disease: Patient with a nonobstructive catheterization in 2010. Patient last had an NSTEMI in 2015 with a nuclear medicine stress test at that point showing a fixed anteroapical defect with no evidence of new ischemia. Patient was started on clopidogrel at that point. Patient is on aspirin and Plavix at home. Troponin negative 1. No concerning changes on EKG. -Continue home aspirin and Plavix.  Hypertension: Patient slightly hypertensive upon initial presentation although she states that she did not take her morning doses of antihypertensives. At home, patient is on amlodipine 10 mg daily, Coreg 12.5 mg twice a day. -Continue home Coreg 12.5 mg twice a day. -Hold home Norvasc in the setting of diuresis. -Consider continuation within a day.  Obstructive sleep apnea: Patient uses a CPAP every night. -Continue home CPAP.  Normocytic anemia: Patient presenting with a hemoglobin of 9.1 which is close to her discharge hemoglobin from 04/15/2014. Nightly secondary to chronic kidney disease. -Continue to monitor  Asymptomatic bacteriuria: Urinalysis obtained in the emergency department showing trace leukocytes, few squamous cells, and many bacteria. However, patient not reporting any urinary symptoms, denying any dysuria, hesitancy, urgency, or suprapubic pain. Patient status post 1 dose of ceftriaxone in the emergency department. -No further antibiotic therapy indicated at this point.  Left leg circumference greater than right: Patient states that her left leg is chronically more swollen than her right leg. Patient denies any recent history of  immobilization or travel. Patient with a lower extremity Doppler in May 2015 showing no evidence of DVT. No pain reported in the leg.  Diet: NPO pending stabilization of CBGs Prophylaxis: SCDs given dual antiplatelet therapy Code: Full  Dispo: Disposition is deferred at this time, awaiting improvement of current medical problems. Anticipated discharge in approximately 3 day(s).   The patient does have a current PCP (Katherina Mires, MD) and does need an [facility] hospital follow-up appointment after discharge.  The patient does not have transportation limitations that hinder transportation to clinic appointments.  Signed: Luan Moore, M.D., Ph.D. Internal Medicine Teaching Service, PGY-1 05/02/2014, 10:26 AM

## 2014-05-03 DIAGNOSIS — I129 Hypertensive chronic kidney disease with stage 1 through stage 4 chronic kidney disease, or unspecified chronic kidney disease: Secondary | ICD-10-CM

## 2014-05-03 DIAGNOSIS — Z79899 Other long term (current) drug therapy: Secondary | ICD-10-CM

## 2014-05-03 DIAGNOSIS — Z7982 Long term (current) use of aspirin: Secondary | ICD-10-CM

## 2014-05-03 DIAGNOSIS — R0602 Shortness of breath: Secondary | ICD-10-CM

## 2014-05-03 LAB — BASIC METABOLIC PANEL
ANION GAP: 10 (ref 5–15)
BUN: 101 mg/dL — ABNORMAL HIGH (ref 6–23)
CALCIUM: 8.6 mg/dL (ref 8.4–10.5)
CO2: 23 mmol/L (ref 19–32)
CREATININE: 2.72 mg/dL — AB (ref 0.50–1.10)
Chloride: 102 mmol/L (ref 96–112)
GFR, EST AFRICAN AMERICAN: 23 mL/min — AB (ref >90)
GFR, EST NON AFRICAN AMERICAN: 20 mL/min — AB (ref >90)
Glucose, Bld: 235 mg/dL — ABNORMAL HIGH (ref 70–99)
Potassium: 3.7 mmol/L (ref 3.5–5.1)
Sodium: 135 mmol/L (ref 135–145)

## 2014-05-03 LAB — URINE CULTURE
Colony Count: NO GROWTH
Culture: NO GROWTH

## 2014-05-03 LAB — GLUCOSE, CAPILLARY
GLUCOSE-CAPILLARY: 200 mg/dL — AB (ref 70–99)
Glucose-Capillary: 230 mg/dL — ABNORMAL HIGH (ref 70–99)
Glucose-Capillary: 215 mg/dL — ABNORMAL HIGH (ref 70–99)
Glucose-Capillary: 233 mg/dL — ABNORMAL HIGH (ref 70–99)

## 2014-05-03 LAB — IRON AND TIBC
IRON: 34 ug/dL — AB (ref 42–145)
Saturation Ratios: 16 % — ABNORMAL LOW (ref 20–55)
TIBC: 215 ug/dL — ABNORMAL LOW (ref 250–470)
UIBC: 181 ug/dL (ref 125–400)

## 2014-05-03 LAB — CBC
HCT: 27.3 % — ABNORMAL LOW (ref 36.0–46.0)
HEMOGLOBIN: 8.7 g/dL — AB (ref 12.0–15.0)
MCH: 28.2 pg (ref 26.0–34.0)
MCHC: 31.9 g/dL (ref 30.0–36.0)
MCV: 88.6 fL (ref 78.0–100.0)
Platelets: 321 10*3/uL (ref 150–400)
RBC: 3.08 MIL/uL — AB (ref 3.87–5.11)
RDW: 13.7 % (ref 11.5–15.5)
WBC: 9.7 10*3/uL (ref 4.0–10.5)

## 2014-05-03 LAB — MAGNESIUM: MAGNESIUM: 2.2 mg/dL (ref 1.5–2.5)

## 2014-05-03 MED ORDER — SODIUM CHLORIDE 0.9 % IV SOLN
510.0000 mg | INTRAVENOUS | Status: DC
  Administered 2014-05-03: 510 mg via INTRAVENOUS
  Filled 2014-05-03: qty 17

## 2014-05-03 MED ORDER — INSULIN ASPART 100 UNIT/ML ~~LOC~~ SOLN: 0.0000 [IU] | Freq: Every day | SUBCUTANEOUS | Status: DC

## 2014-05-03 MED ORDER — DARBEPOETIN ALFA 100 MCG/0.5ML IJ SOSY
100.0000 ug | PREFILLED_SYRINGE | INTRAMUSCULAR | Status: DC
  Administered 2014-05-03: 100 ug via SUBCUTANEOUS
  Filled 2014-05-03 (×2): qty 0.5

## 2014-05-03 MED ORDER — METOLAZONE 10 MG PO TABS
10.0000 mg | ORAL_TABLET | Freq: Once | ORAL | Status: AC
  Administered 2014-05-03: 10 mg via ORAL
  Filled 2014-05-03: qty 1

## 2014-05-03 MED ORDER — INSULIN GLARGINE 100 UNIT/ML ~~LOC~~ SOLN
45.0000 [IU] | Freq: Every day | SUBCUTANEOUS | Status: DC
  Administered 2014-05-03 – 2014-05-05 (×3): 45 [IU] via SUBCUTANEOUS
  Filled 2014-05-03 (×4): qty 0.45

## 2014-05-03 MED ORDER — LORAZEPAM 2 MG/ML IJ SOLN
1.0000 mg | Freq: Once | INTRAMUSCULAR | Status: AC
  Administered 2014-05-03: 1 mg via INTRAVENOUS
  Filled 2014-05-03: qty 1

## 2014-05-03 MED ORDER — INSULIN ASPART 100 UNIT/ML ~~LOC~~ SOLN: 0.0000 [IU] | Freq: Three times a day (TID) | SUBCUTANEOUS | Status: DC

## 2014-05-03 MED ORDER — AMLODIPINE BESYLATE 10 MG PO TABS
10.0000 mg | ORAL_TABLET | Freq: Every day | ORAL | Status: DC
  Administered 2014-05-03 – 2014-05-06 (×4): 10 mg via ORAL
  Filled 2014-05-03 (×4): qty 1

## 2014-05-03 MED ORDER — DOCUSATE SODIUM 100 MG PO CAPS
100.0000 mg | ORAL_CAPSULE | Freq: Two times a day (BID) | ORAL | Status: DC
  Administered 2014-05-03 – 2014-05-06 (×7): 100 mg via ORAL
  Filled 2014-05-03 (×8): qty 1

## 2014-05-03 NOTE — Progress Notes (Signed)
VASCULAR LAB PRELIMINARY  PRELIMINARY  PRELIMINARY  PRELIMINARY  Bilateral lower extremity venous duplex  completed.    Preliminary report:  Bilateral:  No evidence of DVT, superficial thrombosis, or Baker's Cyst.    Kemberly Taves, RVT 05/03/2014, 2:26 PM

## 2014-05-03 NOTE — Progress Notes (Signed)
Inpatient Diabetes Program Recommendations  AACE/ADA: New Consensus Statement on Inpatient Glycemic Control (2013)  Target Ranges:  Prepandial:   less than 140 mg/dL      Peak postprandial:   less than 180 mg/dL (1-2 hours)      Critically ill patients:  140 - 180 mg/dL   Reason for Visit: Hyperglycemia  Diabetes history: DM2 Outpatient Diabetes medications: Lantus 60 units QHS and Apidra 15 units tidwc Current orders for Inpatient glycemic control: Lantus 30 units QHS and Novolog resistant tidwc  Results for Celesta GentileMONROE, Apryll L (MRN [id number]) as of 05/03/2014 12:38  Ref. Range 05/02/2014 12:33 05/02/2014 18:04 05/02/2014 21:17 05/03/2014 06:04 05/03/2014 11:35  Glucose-Capillary Latest Range: 70-99 mg/dL 829247 (H) 562294 (H) 130270 (H) 230 (H) 215 (H)     Blood sugars elevated. Needs insulin adjustment.  Recommendations: Increase Lantus to 45 units QHS Add HS correction When po intake increases, add Novolog 6 units tidwc and titrate as needed. Please order HgbA1C to assess glycemic control prior to hospitalization  Will follow. Thank you. Ailene Ardshonda Sahib Pella, RD, LDN, CDE Inpatient Diabetes Coordinator [phone]

## 2014-05-03 NOTE — Progress Notes (Signed)
Advanced Heart Failure Rounding Note   Subjective:     44 y.o [place] American female with history of morbid obesity, nonobstructive CAD per cath in 2010, HTN, OSA, uncontrolled DM type 1, class III CKD and diastolic HF, presenting with dyspnea and weight gain in the setting of acute on chronic diastolic CHF not responding to increased diuretic regimen and worsening renal function.   She has been followed cloesly in the HF clinic and was last on 4/5 for follow up with volume overload noted. She had ongoing volume overload and she was instructed to increase torsemide to 100 mg twice daily and return the next for IV lasix. She returned for RN visit on 4/6 for 120 mg IV lasix. She had less than 500 cc urine output and at that point Dr Shirlee Latch requested follow up with nephrology ASAP.   Earlier today she present to [facility] ED with increased dyspnea and hyperglycemia.   She has been diuresing with 160 mg IV lasix every 6 hours + 10 mg metolazone. Sluggish urine output. Nephrology following.  Consulted. Weight down 2 pounds.  Denies SOB. Feeling better.       Creatinine 2.7> 2.72   Objective:   Weight Range:  Vital Signs:   Temp:  [97.4 F (36.3 C)-98.2 F (36.8 C)] 98.2 F (36.8 C) (04/08 0501) Pulse Rate:  [79-92] 86 (04/08 0501) Resp:  [18-28] 18 (04/08 0501) BP: (125-160)/(49-70) 146/59 mmHg (04/08 0501) SpO2:  [93 %-98 %] 98 % (04/08 0501) Weight:  [299 lb (135.626 kg)-301 lb 12.8 oz (136.896 kg)] 299 lb (135.626 kg) (04/08 0501) Last BM Date: 04/30/14  Weight change: Filed Weights   05/02/14 0603 05/02/14 1519 05/03/14 0501  Weight: 306 lb 8 oz (139.027 kg) 301 lb 12.8 oz (136.896 kg) 299 lb (135.626 kg)    Intake/Output:   Intake/Output Summary (Last 24 hours) at 05/03/14 1024 Last data filed at 05/03/14 0848  Gross per 24 hour  Intake    921 ml  Output   1700 ml  Net   -779 ml    PHYSICAL EXAM: General: No resp difficulty. In bed  HEENT: normal Neck: supple. JVP  to jaw . Carotids 2+ bilaterally; no bruits. No lymphadenopathy or thryomegaly appreciated. Cor: PMI normal. Regular rate & rhythm. No rubs, gallops. 2/6 early SEM RUSB.  Lungs: clear. In bed.  Abdomen: obese, soft, nontender, +distended. No hepatosplenomegaly. No bruits or masses. Good bowel sounds. Extremities: no cyanosis, clubbing, rash. R and LLE 2+ edema to knees bilaterally.  Neuro: alert & orientedx3, cranial nerves grossly intact. Moves all 4 extremities w/o difficulty. Affect pleasa   Telemetry: SR 80s   Labs: Basic Metabolic Panel:  Recent Labs Lab 04/30/14 1600 05/02/14 0555 05/02/14 0945 05/02/14 1309 05/03/14 0311  NA 134* 129* 131* 130* 135  K 3.8 3.8 3.7 3.8 3.7  CL 98 96 98 94* 102  CO2 21 19 18* 23 23  GLUCOSE 316* 611* 577* 294* 235*  BUN 103* 103* 101* 102* 101*  CREATININE 2.80* 2.85* 2.83* 2.70* 2.72*  CALCIUM 8.3* 8.3* 8.3* 8.5 8.6  MG  --   --  2.2  --  2.2    Liver Function Tests: No results for input(s): AST, ALT, ALKPHOS, BILITOT, PROT, ALBUMIN in the last 168 hours. No results for input(s): LIPASE, AMYLASE in the last 168 hours. No results for input(s): AMMONIA in the last 168 hours.  CBC:  Recent Labs Lab 05/02/14 0555 05/03/14 0311  WBC 13.6* 9.7  HGB 9.1*  8.7*  HCT 28.1* 27.3*  MCV 88.9 88.6  PLT 316 321    Cardiac Enzymes:  Recent Labs Lab 05/02/14 1309 05/02/14 1840  TROPONINI <0.03 <0.03    BNP: BNP (last 3 results)  Recent Labs  04/10/14 1040 04/22/14 1027 05/02/14 0555  BNP 331.1* 244.5* 382.7*    ProBNP (last 3 results)  Recent Labs  05/25/13 0545 12/03/13 0305  PROBNP 3258.0* 2451.0*      Other results:  Imaging: Dg Chest 2 View  05/02/2014   CLINICAL DATA:  Shortness of breath for a few days, bilateral lower extremity swelling. History of diastolic heart failure, morbid obesity, hypertension, diabetes, chronic kidney disease.  EXAM: CHEST  2 VIEW  COMPARISON:  Chest radiograph  April 09, 2004  FINDINGS: The cardiac silhouette appears mildly enlarged, similar. Mediastinal silhouette is nonsuspicious. Interstitial prominence, patchy bibasilar airspace opacities. No pleural effusion. No pneumothorax. Large body habitus. Osseous structures are nonsuspicious.  IMPRESSION: Mild cardiomegaly. Interstitial prominence suggests pulmonary edema, confluent in the lung bases (less likely superimposed pneumonia).   Electronically Signed   By: Awilda Metroourtnay  Bloomer   On: 05/02/2014 06:23      Medications:     Scheduled Medications: . aspirin EC  81 mg Oral Daily  . atorvastatin  80 mg Oral q1800  . carvedilol  12.5 mg Oral BID WC  . Chlorhexidine Gluconate Cloth  6 each Topical Q0600  . clopidogrel  75 mg Oral Q breakfast  . DULoxetine  60 mg Oral BID  . ferrous sulfate  325 mg Oral BID  . furosemide  160 mg Intravenous Q6H  . insulin aspart  0-20 Units Subcutaneous TID WC  . insulin glargine  30 Units Subcutaneous QHS  . metoCLOPramide  10 mg Oral TID AC  . mupirocin ointment  1 application Nasal BID  . prenatal multivitamin  1 tablet Oral Q1200  . sodium chloride  3 mL Intravenous Q12H     Infusions:     PRN Medications:  albuterol, HYDROcodone-acetaminophen, nitroGLYCERIN   Assessment:   1. Acute on chronic diastolic CHF: Echo 3/16 with EF 55-60%, mild AS, preserved RV. Volume status elevated despite escalating diuretics regimen at home. Continue lasix 120 mg IV every 6 hours + 10 metolazone today.  Follow renal function closely.  2. CKD: Nephrology consulted. Nearing HD.  3. OSA: Severe, uses CPAP at home 4. Uncontrolled DM 5. Anemia - Iron stores low. On iron.   Plan/Discussion:    Volume status improving but urine output slowing. Still with at least 10 pounds to go. Continue 160 mg IV every 6 hours with 10 mg metolazone. Renal function unchanged. Nephrology following.    Iron stores low. Continue ferrous sulfate bid. Add stool softner.    Length  of Stay: 1   CLEGG,AMY NP-C  05/03/2014, 10:24 AM  Advanced Heart Failure Team Pager [phone] (M-F; 7a - 4p)  Please contact [facility] for night-coverage after hours (4p -7a ) and weekends on [email or web address]  Patient seen and examined with Tonye BecketAmy Clegg, NP. We discussed all aspects of the encounter. I agree with the assessment and plan as stated above.   Dr. Marland McalpineWebb's notes reviewed. Her renal failure seems to be the major issue in her volume management. Will continue with IV lasix. Suspect she will need HD soon to adequately manager her. No new recs from our standpoint. We will follow at a distance and see again Monday. Please call with questions.   Santez Woodcox,MD 11:43 AM

## 2014-05-03 NOTE — Progress Notes (Signed)
Subjective:  Patient reports that her breathing is improved this morning. She states that her peripheral edema has seemed to remain stable. She states that she has been able to have a fair amount of urine output overnight. Patient is not reporting any other complaints this morning.  Objective: Vital signs in last 24 hours: Filed Vitals:   05/02/14 1519 05/02/14 2019 05/03/14 0501 05/03/14 1104  BP: 160/70 125/59 146/59 156/60  Pulse: 85 83 86 80  Temp: 97.4 F (36.3 C) 98.1 F (36.7 C) 98.2 F (36.8 C) 98.9 F (37.2 C)  TempSrc: Oral Oral Oral Oral  Resp: Height:  (1.702 m)     Weight: 301 lb 12.8 oz (136.896 kg)  299 lb (135.626 kg)   SpO2: 97% 98% 98% 99%   Weight change: -4 lb 11.2 oz (-2.132 kg)  Intake/Output Summary (Last 24 hours) at 05/03/14 1335 Last data filed at 05/03/14 1118  Gross per 24 hour  Intake    921 ml  Output   3600 ml  Net  -2679 ml    General: resting in bed, nasal cannula in place, satting well with oxygen turned off HEENT: PERRL, EOMI, no scleral icterus Cardiac: Regular rate and rhythm, 2/6 systolic murmur, no rubs or gallops Pulm: Clear to auscultation bilaterally, good air movement Abd: soft, nontender, nondistended, BS present Ext: warm and well perfused, 1+ pitting edema bilateral lower extremities up to the level of the knee, left leg circumference greater than right, no cutaneous changes, no erythema, no ulcers Neuro: alert and oriented X3, cranial nerves II-XII grossly intact Skin: no rashes or lesions noted Psych: appropriate affect  Lab Results: Basic Metabolic Panel:  Recent Labs Lab 05/02/14 0945 05/02/14 1309 05/03/14 0311  NA 131* 130* 135  K 3.7 3.8 3.7  CL 98 94* 102  CO2 18* 23 23  GLUCOSE 577* 294* 235*  BUN 101* 102* 101*  CREATININE 2.83* 2.70* 2.72*  CALCIUM 8.3* 8.5 8.6  MG 2.2  --  2.2   Liver Function Tests: No results for input(s): AST, ALT, ALKPHOS, BILITOT, PROT, ALBUMIN in the last  168 hours. No results for input(s): LIPASE, AMYLASE in the last 168 hours. No results for input(s): AMMONIA in the last 168 hours. CBC:  Recent Labs Lab 05/02/14 0555 05/03/14 0311  WBC 13.6* 9.7  HGB 9.1* 8.7*  HCT 28.1* 27.3*  MCV 88.9 88.6  PLT 316 321   Cardiac Enzymes:  Recent Labs Lab 05/02/14 1309 05/02/14 1840  TROPONINI <0.03 <0.03   BNP: No results for input(s): PROBNP in the last 168 hours. D-Dimer: No results for input(s): DDIMER in the last 168 hours. CBG:  Recent Labs Lab 05/02/14 1129 05/02/14 1233 05/02/14 1804 05/02/14 2117 05/03/14 0604 05/03/14 1135  GLUCAP 362* 247* 294* 270* 230* 215*   Hemoglobin A1C: No results for input(s): HGBA1C in the last 168 hours. Fasting Lipid Panel: No results for input(s): CHOL, HDL, LDLCALC, TRIG, CHOLHDL, LDLDIRECT in the last 168 hours. Thyroid Function Tests: No results for input(s): TSH, T4TOTAL, FREET4, T3FREE, THYROIDAB in the last 168 hours. Coagulation: No results for input(s): LABPROT, INR in the last 168 hours. Anemia Panel:  Recent Labs Lab 05/02/14 1840  TIBC 215*  IRON 34*   Urine Drug Screen: Drugs of Abuse     Component Value Date/Time   LABOPIA NONE DETECTED 05/02/2014 1507   COCAINSCRNUR NONE DETECTED 05/02/2014 1507   LABBENZ NONE DETECTED 05/02/2014 1507   AMPHETMU NONE  DETECTED 05/02/2014 1507   THCU NONE DETECTED 05/02/2014 1507   LABBARB NONE DETECTED 05/02/2014 1507    Alcohol Level: No results for input(s): ETH in the last 168 hours. Urinalysis:  Recent Labs Lab 05/02/14 0605  COLORURINE YELLOW  LABSPEC 1.012  PHURINE 5.0  GLUCOSEU 500*  HGBUR NEGATIVE  BILIRUBINUR NEGATIVE  KETONESUR NEGATIVE  PROTEINUR 100*  UROBILINOGEN 0.2  NITRITE NEGATIVE  LEUKOCYTESUR TRACE*   Micro Results: Recent Results (from the past 240 hour(s))  Urine culture     Status: None   Collection Time: 05/02/14  3:07 PM  Result Value Ref Range Status   Specimen Description URINE,  RANDOM  Final   Special Requests NONE  Final   Colony Count NO GROWTH Performed at [facility]   Final   Culture NO GROWTH Performed at [facility]   Final   Report Status 05/03/2014 FINAL  Final   Studies/Results: Dg Chest 2 View  05/02/2014   CLINICAL DATA:  Shortness of breath for a few days, bilateral lower extremity swelling. History of diastolic heart failure, morbid obesity, hypertension, diabetes, chronic kidney disease.  EXAM: CHEST  2 VIEW  COMPARISON:  Chest radiograph April 09, 2004  FINDINGS: The cardiac silhouette appears mildly enlarged, similar. Mediastinal silhouette is nonsuspicious. Interstitial prominence, patchy bibasilar airspace opacities. No pleural effusion. No pneumothorax. Large body habitus. Osseous structures are nonsuspicious.  IMPRESSION: Mild cardiomegaly. Interstitial prominence suggests pulmonary edema, confluent in the lung bases (less likely superimposed pneumonia).   Electronically Signed   By: Awilda Metroourtnay  Bloomer   On: 05/02/2014 06:23   Medications: I have reviewed the patient's current medications. Scheduled Meds: . aspirin EC  81 mg Oral Daily  . atorvastatin  80 mg Oral q1800  . carvedilol  12.5 mg Oral BID WC  . Chlorhexidine Gluconate Cloth  6 each Topical Q0600  . clopidogrel  75 mg Oral Q breakfast  . darbepoetin (ARANESP) injection - NON-DIALYSIS  100 mcg Subcutaneous Q Fri-1800  . docusate sodium  100 mg Oral BID  . DULoxetine  60 mg Oral BID  . ferrous sulfate  325 mg Oral BID  . ferumoxytol  510 mg Intravenous Weekly  . furosemide  160 mg Intravenous Q6H  . insulin aspart  0-20 Units Subcutaneous TID WC  . insulin glargine  30 Units Subcutaneous QHS  . metoCLOPramide  10 mg Oral TID AC  . mupirocin ointment  1 application Nasal BID  . prenatal multivitamin  1 tablet Oral Q1200  . sodium chloride  3 mL Intravenous Q12H   Continuous Infusions:  PRN Meds:.albuterol, HYDROcodone-acetaminophen,  nitroGLYCERIN Assessment/Plan: Principal Problem:   Acute on chronic diastolic congestive heart failure Active Problems:   Anemia, chronic disease   CKD (chronic kidney disease), stage IV   Gastroparesis   Morbid obesity   Diabetes mellitus type 2   OSA on CPAP   Hyperlipidemia   Aortic stenosis, mild   Hypertension   Coronary artery disease   Acute on chronic diastolic CHF (congestive heart failure)   Hyperglycemia due to type 2 diabetes mellitus   Patient is a 44 year old female with history of congestive heart failure, type 2 diabetes, chronic kidney disease, coronary artery disease, hypertension, objective sleep apnea, normocytic anemia who is admitted for acute decompensated heart failure in the setting of stage IV chronic kidney disease.  Acute on chronic congestive heart failure: Patient reporting improved breathing although with persistent peripheral edema. Patient's weight is down to 299 pounds from 306 pounds upon  admission. Patient states that her dry weight is in the 280s. Patient is netnegative at this point of -3.7 L. There is an improved lung exam. Chronic kidney disease most likely a complicating factor as patient may require dialysis in the near future. Strategy at this point is to continue with aggressive diuresis and assess the need for dialysis depending on response. -Appreciate cardiology and nephrology recommendations. -Continue with Lasix 160 mg every 6 hours IV -Patient status post 10 mg of metolazone yesterday. -Continue strict I's and O's -Potassium and magnesium within normal limits. -Lower extremity Doppler pending given asymmetric extremities (although stable over several less months)  Left leg circumference greater than right: Patient states that her left leg is chronically more swollen than her right leg. Patient denies any recent history of immobilization or travel. Patient with a lower extremity Doppler in May 2015 showing no evidence of DVT. No pain  reported in the leg. -Lower extremity Doppler pending.  Type 2 diabetes: Patient now with better blood glucose control is status post insulin drip upon initial evaluation. Patient's blood glucoses have remained elevated in the 200s however. Patient has been on a reduced home regimen of Lantus 30 units daily at bedtime. -Increase Lantus from 30 units daily at bedtime to 45 units daily at bedtime -Will add a nighttime correction. -Hemoglobin A1c -Appreciate assistance from diabetes coordinator.  Chronic kidney disease: Patient's BUN and creatinine are stably elevated at 101 and 2.72 respectively. No signs of additional kidney injury in the context of aggressive diuresis. As discussed above, there is some consideration of dialysis at some point in the future. Iron low at 34. -Appreciate nephrology recommendations -Feraheme IV in the context of anemia of chronic kidney disease -PTH pending  Nonobstructive coronary artery disease: Patient with a nonobstructive catheterization in 2010. Patient last had an NSTEMI in 2015 with a nuclear medicine stress test at that point showing a fixed anteroapical defect with no evidence of new ischemia. Patient was started on clopidogrel at that point. Patient is on aspirin and clopidogrel at home. Troponin negative 3 at this point. -Continue home aspirin and clopidogrel.  Hypertension: Blood pressure is moderately elevated in the 140s to 150s systolic. At home, patient is on amlodipine 10 mg daily, Coreg 12.5 mg twice a day. -Continue home Coreg 12.5 mg twice a day. -Resume home amlodipine.  Obstructive sleep apnea: Patient uses a CPAP every night. -Continue home CPAP.  Anemia chronic kidney disease: Iron stores low at 34. Prior ferritin elevated at 710. -IV Feraheme per nephrology.  Asymptomatic bacteriuria: Urinalysis obtained in the emergency department showing trace leukocytes, few squamous cells, and many bacteria. However, patient not reporting any  urinary symptoms, denying any dysuria, hesitancy, urgency, or suprapubic pain. Patient status post 1 dose of ceftriaxone in the emergency department. -No further antibiotic therapy indicated at this point.  Diet: Carb modified Prophylaxis: SCDs given dual antiplatelet therapy Code: Full  Dispo: Disposition is deferred at this time, awaiting improvement of current medical problems. Anticipated discharge in approximately 3 day(s).   The patient does have a current PCP (Macy Mis, MD) and does need an [facility] hospital follow-up appointment after discharge.  The patient does not have transportation limitations that hinder transportation to clinic appointments.   LOS: 1 day   Services Needed at time of discharge: Y = Yes, Blank = No PT:   OT:   RN:   Equipment:   Other:    Harold Barban, MD 05/03/2014, 1:35 PM

## 2014-05-03 NOTE — Discharge Summary (Signed)
Name: Jasmine [name] MRN: [id number] DOB: 1970-12-25 44 y.o. PCP: Katherina Mires, MD  Date of Admission: 05/02/2014  5:40 AM Date of Discharge: 05/06/2014 Attending Physician: Bertha Stakes, MD  Discharge Diagnosis: Principal Problem: Acute on Chronic CHF Exacerbation Active Problems: Hypokalemia CKD Stage 4 HTN Insulin-dependent Type 2 DM Nonobstructive CAD Anemia of Chronic Disease OSA Anxiety and Depression   Discharge Medications:   Medication List    TAKE these medications        acetaminophen 500 MG tablet  Commonly known as:  TYLENOL  Take 500 mg by mouth every 6 (six) hours as needed for mild pain.     albuterol 108 (90 BASE) MCG/ACT inhaler  Commonly known as:  PROVENTIL HFA;VENTOLIN HFA  Inhale 2 puffs into the lungs every 6 (six) hours as needed for wheezing.     amLODipine 10 MG tablet  Commonly known as:  NORVASC  Take 10 mg by mouth daily.     aspirin 81 MG tablet  Take 81 mg by mouth daily. For pain     atorvastatin 80 MG tablet  Commonly known as:  LIPITOR  Take 1 tablet (80 mg total) by mouth daily at 6 PM.     calcitRIOL 0.25 MCG capsule  Commonly known as:  ROCALTROL  Take 1 capsule (0.25 mcg total) by mouth daily.     capsicum 0.075 % topical cream  Commonly known as:  ZOSTRIX  Apply 1 application topically 3 (three) times daily. Apply to feet up to 3x daily     carvedilol 6.25 MG tablet  Commonly known as:  COREG  TAKE 2 TABLETS (12.5 MG TOTAL) BY MOUTH 2 (TWO) TIMES DAILY WITH A MEAL.     clopidogrel 75 MG tablet  Commonly known as:  PLAVIX  Take 75 mg by mouth daily with breakfast.     DULoxetine 60 MG capsule  Commonly known as:  CYMBALTA  Take 1 capsule (60 mg total) by mouth 2 (two) times daily.     ferrous sulfate 325 (65 FE) MG tablet  Take 325 mg by mouth 2 (two) times daily.     glucose blood test strip  Check glucose TID and prn.     HYDROcodone-acetaminophen 5-325 MG per tablet  Commonly known as:  NORCO  Take 1  tablet by mouth every 6 (six) hours as needed for moderate pain.     insulin glargine 100 UNIT/ML injection  Commonly known as:  LANTUS  Inject 60 Units into the skin at bedtime.     insulin lispro 100 UNIT/ML injection  Commonly known as:  HUMALOG  Inject 15 Units into the skin 3 (three) times daily before meals.     metoCLOPramide 10 MG tablet  Commonly known as:  REGLAN  Take 10 mg by mouth 3 (three) times daily before meals. One po qac     metolazone 2.5 MG tablet  Commonly known as:  ZAROXOLYN  Take 1 tablet (2.5 mg total) by mouth 2 (two) times a week. Every Mon and Fri     nitroGLYCERIN 0.4 MG SL tablet  Commonly known as:  NITROSTAT  Place 1 tablet (0.4 mg total) under the tongue every 5 (five) minutes as needed for chest pain.     ONE TOUCH ULTRA 2 W/DEVICE Kit  Check CBG's TID and prn     potassium chloride SA 20 MEQ tablet  Commonly known as:  K-DUR,KLOR-CON  Take 1 tablet (20 mEq total) by mouth 2 (two) times  daily. On Mondays take an additional 40 MeQ daily     prenatal multivitamin Tabs tablet  Take 1 tablet by mouth daily at 12 noon.     torsemide 20 MG tablet  Commonly known as:  DEMADEX  Take 5 tablets (100 mg total) by mouth 2 (two) times daily.        Disposition and follow-up:   Jasmine [name] was discharged from [facility] in Good condition.  At the hospital follow up visit please address:  1.  CHF- Check fluid balance. Discharge weight was 275 lbs.  Hypokalemia- Is patient taking supplemental K? Check bmet.  CKD Stage 4- Cr became slightly elevated from baseline due to aggressive diuresis. Check Cr on bmet.  Type 2 DM- Repeat HbA1c elevated at 9.8. Please adjust insulin regimen for better glucose control.   2.  Labs / imaging needed at time of follow-up: bmet- check K and Cr  3.  Pending labs/ test needing follow-up: None  Follow-up Appointments: Follow-up Information    Follow up with Glori Bickers, MD On 05/15/2014.     Specialty:  Cardiology   Why:  at 0900 am in the [facility]--gate code [id number]--please bring all medications to appt   Contact information:   [place] [address] [phone]       Follow up with Suzanna Obey, MD.   Specialty:  Family Medicine   Why:  Hospital follow up appointment on [place] 20th at 9:40 AM   Contact information:   [place] [address] [phone]       Discharge Instructions:   Consultations: Treatment Team:  Edrick Oh, MD  Procedures Performed:  Dg Chest 2 View  05/02/2014   CLINICAL DATA:  Shortness of breath for a few days, bilateral lower extremity swelling. History of diastolic heart failure, morbid obesity, hypertension, diabetes, chronic kidney disease.  EXAM: CHEST  2 VIEW  COMPARISON:  Chest radiograph April 09, 2004  FINDINGS: The cardiac silhouette appears mildly enlarged, similar. Mediastinal silhouette is nonsuspicious. Interstitial prominence, patchy bibasilar airspace opacities. No pleural effusion. No pneumothorax. Large body habitus. Osseous structures are nonsuspicious.  IMPRESSION: Mild cardiomegaly. Interstitial prominence suggests pulmonary edema, confluent in the lung bases (less likely superimposed pneumonia).   Electronically Signed   By: Elon Alas   On: 05/02/2014 06:23   Dg Chest Port 1 View  04/10/2014   CLINICAL DATA:  44 year old female with shortness of breath and abnormal pulmonary auscultation. Initial encounter.  EXAM: PORTABLE CHEST - 1 VIEW  COMPARISON:  12/03/2013 and earlier.  FINDINGS: Portable AP semi upright view at 1029 hours. Stable cardiac size and mediastinal contours. Mildly improved lung volumes. Basilar predominant but diffuse indistinct pulmonary opacity. This is less asymmetric than on the comparison. No pneumothorax, consolidation or pleural effusion identified.  IMPRESSION: Indistinct bilateral pulmonary opacity. Favor acute pulmonary  edema over bilateral pneumonia.   Electronically Signed   By: Genevie Ann M.D.   On: 04/10/2014 10:53    2D Echo: none  Cardiac Cath: none  Admission HPI:   Patient is a 44 year old female with history of congestive heart failure, type 2 diabetes, chronic kidney disease, coronary artery disease, hypertension, objective sleep apnea, normocytic anemia who presents with a several week history of progressive dyspnea, peripheral edema, and weight gain. Patient states that she has not had any changes in her diet. She has noted some oliguria over this time period, and states that she  is compliant with her home regimen of torsemide and metolazone as needed. Patient was seen in heart failure clinic the day prior to presentation and was given 120 mg of IV Lasix. After this, patient noted some decent diuresis through the evening although came back to the hospital for persistent dyspnea. Patient also has concurrent chronic kidney disease and was advised in heart failure clinic to see her nephrologist.  Additionally, patient is reporting a dull, parasternal chest pain that started several days ago. She states that it is worse with deep inspiration although notes some exertional component when she walks long distances or up stairs. She also reports increased frequency of loose bowel movements having 2 a day over the last week. She denies any recent use of antibiotics. She denies any change in the color of her stool. She also reports left greater than right leg edema and denies any associated pain, recent travel, or immobilization lately. Otherwise, patient denies any fevers, chills, abdominal pain, constipation, dysuria, or hematuria.  She does state that she missed several doses of her medications including her evening and morning doses of insulin prior to presentation. In the emergency department, patient received furosemide 80 mg IV, ceftriaxone 1 g, potassium chloride 40 mEq orally, and was started on an insulin  infusion.  Hospital Course by problem list:   Acute on chronic congestive heart failure: Patient presenting with a 3 week history of progressive weight gain, dyspnea, and peripheral edema. Patient's weight was increased to 306 compared to 289 pounds from discharge from a mid-March admission. Physical exam was remarkable for bibasilar rales. Chest x-ray was remarkable for interstitial prominence consistent with pulmonary edema. Patient was started on Lasix 80 mg IV and this was increased to Lasix 160 mg every 6 hours IV. Additionally, 10 mg a metolazone was administered. Patient had decent amount of diuresis with a net of 12.4 L and 31 lbs total. Her discharge weight was 275 lb. Per recommendations from Nephrology, patient was discharged on home Torsemide 100 mg BID and K-Dur 20 mEq BID. She has a PCP follow up appointment next week as well as follow up with the Heart Failure team. She will need a repeat bmet at her PCP's.   Left leg circumference greater than right: Patient states that her left leg is chronically more swollen than her right leg. A lower extremity Doppler showed no evidence of DVT. This should continue to be monitored by her PCP.  Type 2 diabetes: Patient initially presented with an elevated blood glucose of 611. Patient was not started on fluid resuscitation given her concurrent acute on chronic congestive heart failure. Patient was started on an insulin drip with reduction of her blood glucoses down to the 200s to 300s. Patient was resumed on Lantus 30 units which is a reduced dosage compared to her home dosage of Lantus 60 units daily at bedtime. A hemoglobin A1c showed 9.8. Her blood sugars remained stable in the 130s-180s. Patient was discharged on her home Lantus 60 units daily and Humalog 15 units TID.   Chronic kidney disease: Patient came in with a creatinine of 2.85 which is close to her baseline although this represents a GFR of 22. Nephrology was consulted and aggressive  diuresis was pursued. Patient's BUN and creatinine remained stably elevated secondary to aggressive diuresis for her CHF. IV Feraheme was administered on 05/03/2014 in the context of anemia of chronic kidney disease. A PTH showed 233 which is consistent with secondary hyperparathyroidism. Patient was discharged on calcitriol 0.25 mcg  daily.   Nonobstructive coronary artery disease: Patient with a nonobstructive catheterization in 2010. Patient last had an NSTEMI in 2015 with a nuclear medicine stress test at that point showing a fixed anteroapical defect with no evidence of new ischemia. Patient was started on clopidogrel at that point. Patient is on aspirin and clopidogrel at home. Troponin negative 3 during this hospitalization with no concerning EKG changes.  Hypertension: After a period of diuresis, patient was continued on her full home regimen of antihypertensives including amlodipine 10 mg daily and Coreg 12.5 mg twice a day.  Obstructive sleep apnea: Patient was continued on her home CPAP during her admission.  Anemia chronic kidney disease: IV Feraheme as above.  Asymptomatic bacteriuria: Urinalysis obtained in the emergency department showing trace leukocytes, few squamous cells, and many bacteria. However, patient not reporting any urinary symptoms, denying any dysuria, hesitancy, urgency, or suprapubic pain. Patient status post 1 dose of ceftriaxone in the emergency department although no further therapy was continued.  Discharge Vitals:   BP 135/51 mmHg  Pulse 82  Temp(Src) 98.6 F (37 C) (Oral)  Resp 18  Ht _0  (1.702 m)  Wt 275 lb 11.2 oz (125.057 kg)  BMI 43.17 kg/m2  SpO2 100% Physical Exam General: sitting up in chair eating breakfast, NAD HEENT: [place]/AT, EOMI, mucus membranes moist Neck: no JVD appreciated CV: RRR, 2/6 systolic murmur heard best at RUSB Pulm: CTA bilaterally, breaths non-labored  Abd: BS+, soft, obese, non-tender Ext: warm, trace edema bilaterally   Neuro: alert and oriented x 3, no focal deficits  Discharge Labs:  Results for orders placed or performed during the hospital encounter of 05/02/14 (from the past 24 hour(s))  Glucose, capillary     Status: Abnormal   Collection Time: 05/05/14  4:30 PM  Result Value Ref Range   Glucose-Capillary 141 (H) 70 - 99 mg/dL   Comment 1 Notify RN    Comment 2 Document in Chart   Glucose, capillary     Status: Abnormal   Collection Time: 05/05/14  9:21 PM  Result Value Ref Range   Glucose-Capillary 187 (H) 70 - 99 mg/dL   Comment 1 Notify RN    Comment 2 Document in Chart   Renal function panel     Status: Abnormal   Collection Time: 05/06/14  3:50 AM  Result Value Ref Range   Sodium 138 135 - 145 mmol/L   Potassium 3.4 (L) 3.5 - 5.1 mmol/L   Chloride 100 96 - 112 mmol/L   CO2 28 19 - 32 mmol/L   Glucose, Bld 138 (H) 70 - 99 mg/dL   BUN 99 (H) 6 - 23 mg/dL   Creatinine, Ser 3.00 (H) 0.50 - 1.10 mg/dL   Calcium 9.4 8.4 - 10.5 mg/dL   Phosphorus 4.7 (H) 2.3 - 4.6 mg/dL   Albumin 3.1 (L) 3.5 - 5.2 g/dL   GFR calc non Af Amer 18 (L) >90 mL/min   GFR calc Af Amer 21 (L) >90 mL/min   Anion gap 10 5 - 15  CBC     Status: Abnormal   Collection Time: 05/06/14  3:50 AM  Result Value Ref Range   WBC 11.3 (H) 4.0 - 10.5 K/uL   RBC 3.61 (L) 3.87 - 5.11 MIL/uL   Hemoglobin 10.3 (L) 12.0 - 15.0 g/dL   HCT 31.9 (L) 36.0 - 46.0 %   MCV 88.4 78.0 - 100.0 fL   MCH 28.5 26.0 - 34.0 pg   MCHC 32.3 30.0 -  36.0 g/dL   RDW 13.7 11.5 - 15.5 %   Platelets 343 150 - 400 K/uL  Glucose, capillary     Status: Abnormal   Collection Time: 05/06/14  5:55 AM  Result Value Ref Range   Glucose-Capillary 134 (H) 70 - 99 mg/dL   Comment 1 Notify RN    Comment 2 Document in Chart   Glucose, capillary     Status: Abnormal   Collection Time: 05/06/14 11:49 AM  Result Value Ref Range   Glucose-Capillary 143 (H) 70 - 99 mg/dL   Comment 1 Notify RN     Signed: Juliet Rude, MD 05/06/2014, 1:58 PM     Services Ordered on Discharge: None Equipment Ordered on Discharge: None

## 2014-05-03 NOTE — Progress Notes (Signed)
S: Feels well, no new CO O:BP 146/59 mmHg  Pulse 86  Temp(Src) 98.2 F (36.8 C) (Oral)  Resp 18  Ht 5\' 7"  (1.702 m)  Wt 135.626 kg (299 lb)  BMI 46.82 kg/m2  SpO2 98%  Intake/Output Summary (Last 24 hours) at 05/03/14 1046 Last data filed at 05/03/14 0848  Gross per 24 hour  Intake    921 ml  Output   1700 ml  Net   -779 ml   Weight change: -2.132 kg (-4 lb 11.2 oz) Gen: awake and alert  O2 by [place] CVS: RRR Resp: Decreased BS throughout Abd:+ BS NTND Ext: 1+ edema Tr presacral edema NEURO: CNI Ox3 no asterixis   . aspirin EC  81 mg Oral Daily  . atorvastatin  80 mg Oral q1800  . carvedilol  12.5 mg Oral BID WC  . Chlorhexidine Gluconate Cloth  6 each Topical Q0600  . clopidogrel  75 mg Oral Q breakfast  . docusate sodium  100 mg Oral BID  . DULoxetine  60 mg Oral BID  . ferrous sulfate  325 mg Oral BID  . furosemide  160 mg Intravenous Q6H  . insulin aspart  0-20 Units Subcutaneous TID WC  . insulin glargine  30 Units Subcutaneous QHS  . metoCLOPramide  10 mg Oral TID AC  . metolazone  10 mg Oral Once  . mupirocin ointment  1 application Nasal BID  . prenatal multivitamin  1 tablet Oral Q1200  . sodium chloride  3 mL Intravenous Q12H   Dg Chest 2 View  05/02/2014   CLINICAL DATA:  Shortness of breath for a few days, bilateral lower extremity swelling. History of diastolic heart failure, morbid obesity, hypertension, diabetes, chronic kidney disease.  EXAM: CHEST  2 VIEW  COMPARISON:  Chest radiograph April 09, 2004  FINDINGS: The cardiac silhouette appears mildly enlarged, similar. Mediastinal silhouette is nonsuspicious. Interstitial prominence, patchy bibasilar airspace opacities. No pleural effusion. No pneumothorax. Large body habitus. Osseous structures are nonsuspicious.  IMPRESSION: Mild cardiomegaly. Interstitial prominence suggests pulmonary edema, confluent in the lung bases (less likely superimposed pneumonia).   Electronically Signed   By: Awilda Metroourtnay  Bloomer    On: 05/02/2014 06:23   BMET    Component Value Date/Time   NA 135 05/03/2014 0311   NA 135 04/03/2014 1045   K 3.7 05/03/2014 0311   CL 102 05/03/2014 0311   CO2 23 05/03/2014 0311   GLUCOSE 235* 05/03/2014 0311   GLUCOSE 339* 04/03/2014 1045   BUN 101* 05/03/2014 0311   BUN 63* 04/03/2014 1045   CREATININE 2.72* 05/03/2014 0311   CALCIUM 8.6 05/03/2014 0311   GFRNONAA 20* 05/03/2014 0311   GFRAA 23* 05/03/2014 0311   CBC    Component Value Date/Time   WBC 9.7 05/03/2014 0311   RBC 3.08* 05/03/2014 0311   RBC 2.73* 10/11/2012 0400   HGB 8.7* 05/03/2014 0311   HCT 27.3* 05/03/2014 0311   HCT 24.8* 04/12/2014 1009   PLT 321 05/03/2014 0311   MCV 88.6 05/03/2014 0311   MCH 28.2 05/03/2014 0311   MCHC 31.9 05/03/2014 0311   RDW 13.7 05/03/2014 0311   LYMPHSABS 1.8 05/27/2013 0310   MONOABS 1.0 05/27/2013 0310   EOSABS 0.2 05/27/2013 0310   BASOSABS 0.0 05/27/2013 0310     Assessment: 1.  CKD 4 sec DM 2. Volume overload 3. Anemia and fe def 4. DM 5. OSA on CPAP    Plan: 1. IV feraheme.  Says she had been on  an ESA in past but just stopped going so will resume as suspect she will need it even after iron levels replete 2. Cont IV diuretics and metolazone 3. Await PTH 4. Discussed the role of HD if volume cannot be controlled with meds 5. Discussed 40oz fluid restriction which she had not been doing at home  Breckyn Ticas T

## 2014-05-03 NOTE — Care Management Note (Unsigned)
    Page 1 of 1   05/03/2014     5:16:25 PM CARE MANAGEMENT NOTE 05/03/2014  Patient:  Jasmine [name],Jasmine [name]   Account Number:  [id number]  Date Initiated:  05/03/2014  Documentation initiated by:  Breyon Sigg  Subjective/Objective Assessment:   Pt adm on 05/02/14 with CHF.  PTA, pt independent, lives with spouse.     Action/Plan:   Will follow for dc needs as pt progresses.   Anticipated DC Date:  05/08/2014   Anticipated DC Plan:  HOME W HOME HEALTH SERVICES      DC Planning Services  CM consult      Choice offered to / List presented to:             Status of service:  In process, will continue to follow Medicare Important Message given?   (If response is "NO", the following Medicare IM given date fields will be blank) Date Medicare IM given:   Medicare IM given by:   Date Additional Medicare IM given:   Additional Medicare IM given by:    Discharge Disposition:    Per UR Regulation:  Reviewed for med. necessity/level of care/duration of stay  If discussed at Long Length of Stay Meetings, dates discussed:    Comments:

## 2014-05-04 ENCOUNTER — Other Ambulatory Visit (HOSPITAL_COMMUNITY): Payer: Self-pay | Admitting: Internal Medicine

## 2014-05-04 DIAGNOSIS — F418 Other specified anxiety disorders: Secondary | ICD-10-CM

## 2014-05-04 DIAGNOSIS — E876 Hypokalemia: Secondary | ICD-10-CM

## 2014-05-04 LAB — RENAL FUNCTION PANEL
Albumin: 3.2 g/dL — ABNORMAL LOW (ref 3.5–5.2)
Anion gap: 11 (ref 5–15)
BUN: 101 mg/dL — AB (ref 6–23)
CO2: 25 mmol/L (ref 19–32)
Calcium: 9.3 mg/dL (ref 8.4–10.5)
Chloride: 101 mmol/L (ref 96–112)
Creatinine, Ser: 2.67 mg/dL — ABNORMAL HIGH (ref 0.50–1.10)
GFR, EST AFRICAN AMERICAN: 24 mL/min — AB (ref >90)
GFR, EST NON AFRICAN AMERICAN: 21 mL/min — AB (ref >90)
Glucose, Bld: 190 mg/dL — ABNORMAL HIGH (ref 70–99)
Phosphorus: 5.5 mg/dL — ABNORMAL HIGH (ref 2.3–4.6)
Potassium: 3.3 mmol/L — ABNORMAL LOW (ref 3.5–5.1)
Sodium: 137 mmol/L (ref 135–145)

## 2014-05-04 LAB — GLUCOSE, CAPILLARY
GLUCOSE-CAPILLARY: 171 mg/dL — AB (ref 70–99)
Glucose-Capillary: 176 mg/dL — ABNORMAL HIGH (ref 70–99)
Glucose-Capillary: 170 mg/dL — ABNORMAL HIGH (ref 70–99)
Glucose-Capillary: 166 mg/dL — ABNORMAL HIGH (ref 70–99)

## 2014-05-04 LAB — CBC
HEMATOCRIT: 28.4 % — AB (ref 36.0–46.0)
HEMOGLOBIN: 9.2 g/dL — AB (ref 12.0–15.0)
MCH: 28.8 pg (ref 26.0–34.0)
MCHC: 32.4 g/dL (ref 30.0–36.0)
MCV: 88.8 fL (ref 78.0–100.0)
Platelets: 313 10*3/uL (ref 150–400)
RBC: 3.2 MIL/uL — ABNORMAL LOW (ref 3.87–5.11)
RDW: 13.7 % (ref 11.5–15.5)
WBC: 10.2 10*3/uL (ref 4.0–10.5)

## 2014-05-04 LAB — PARATHYROID HORMONE, INTACT (NO CA): PTH: 233 pg/mL — AB (ref 15–65)

## 2014-05-04 MED ORDER — POTASSIUM CHLORIDE CRYS ER 20 MEQ PO TBCR
40.0000 meq | EXTENDED_RELEASE_TABLET | Freq: Once | ORAL | Status: AC
  Administered 2014-05-04: 40 meq via ORAL
  Filled 2014-05-04: qty 2

## 2014-05-04 MED ORDER — FUROSEMIDE 10 MG/ML IJ SOLN
160.0000 mg | Freq: Three times a day (TID) | INTRAVENOUS | Status: DC
  Administered 2014-05-04 (×3): 160 mg via INTRAVENOUS
  Filled 2014-05-04 (×6): qty 16

## 2014-05-04 MED ORDER — LORAZEPAM 2 MG/ML IJ SOLN
1.0000 mg | Freq: Every evening | INTRAMUSCULAR | Status: DC | PRN
  Administered 2014-05-04 – 2014-05-05 (×2): 1 mg via INTRAVENOUS
  Filled 2014-05-04 (×2): qty 1

## 2014-05-04 MED ORDER — INSULIN ASPART 100 UNIT/ML ~~LOC~~ SOLN
6.0000 [IU] | Freq: Three times a day (TID) | SUBCUTANEOUS | Status: DC
  Administered 2014-05-04 (×2): 6 [IU] via SUBCUTANEOUS
  Administered 2014-05-05 (×2): via SUBCUTANEOUS
  Administered 2014-05-05 – 2014-05-06 (×4): 6 [IU] via SUBCUTANEOUS

## 2014-05-04 MED ORDER — CALCITRIOL 0.25 MCG PO CAPS
0.2500 ug | ORAL_CAPSULE | Freq: Every day | ORAL | Status: DC
  Administered 2014-05-04 – 2014-05-06 (×3): 0.25 ug via ORAL
  Filled 2014-05-04 (×3): qty 1

## 2014-05-04 NOTE — Progress Notes (Signed)
S: Feels well, no new CO O:BP 150/66 mmHg  Pulse 78  Temp(Src) 98.5 F (36.9 C) (Oral)  Resp 18  Ht 5\' 7"  (1.702 m)  Wt 131.135 kg (289 lb 1.6 oz)  BMI 45.27 kg/m2  SpO2 100%  Intake/Output Summary (Last 24 hours) at 05/04/14 0852 Last data filed at 05/04/14 0610  Gross per 24 hour  Intake   1314 ml  Output   6700 ml  Net  -5386 ml   Weight change: -5.761 kg (-12 lb 11.2 oz) Gen: awake and alert CVS: RRR Resp: Decreased BS throughout Abd:+ BS NTND Ext: tr-1+ edema Tr presacral edema NEURO: CNI Ox3 no asterixis   . amLODipine  10 mg Oral Daily  . aspirin EC  81 mg Oral Daily  . atorvastatin  80 mg Oral q1800  . carvedilol  12.5 mg Oral BID WC  . Chlorhexidine Gluconate Cloth  6 each Topical Q0600  . clopidogrel  75 mg Oral Q breakfast  . darbepoetin (ARANESP) injection - NON-DIALYSIS  100 mcg Subcutaneous Q Fri-1800  . docusate sodium  100 mg Oral BID  . DULoxetine  60 mg Oral BID  . ferrous sulfate  325 mg Oral BID  . ferumoxytol  510 mg Intravenous Weekly  . furosemide  160 mg Intravenous Q6H  . insulin aspart  0-20 Units Subcutaneous TID WC  . insulin aspart  0-5 Units Subcutaneous QHS  . insulin aspart  6 Units Subcutaneous TID WC  . insulin glargine  45 Units Subcutaneous QHS  . metoCLOPramide  10 mg Oral TID AC  . mupirocin ointment  1 application Nasal BID  . prenatal multivitamin  1 tablet Oral Q1200  . sodium chloride  3 mL Intravenous Q12H   No results found. BMET    Component Value Date/Time   NA 137 05/04/2014 0408   NA 135 04/03/2014 1045   K 3.3* 05/04/2014 0408   CL 101 05/04/2014 0408   CO2 25 05/04/2014 0408   GLUCOSE 190* 05/04/2014 0408   GLUCOSE 339* 04/03/2014 1045   BUN 101* 05/04/2014 0408   BUN 63* 04/03/2014 1045   CREATININE 2.67* 05/04/2014 0408   CALCIUM 9.3 05/04/2014 0408   GFRNONAA 21* 05/04/2014 0408   GFRAA 24* 05/04/2014 0408   CBC    Component Value Date/Time   WBC 10.2 05/04/2014 0408   RBC 3.20* 05/04/2014 0408    RBC 2.73* 10/11/2012 0400   HGB 9.2* 05/04/2014 0408   HCT 28.4* 05/04/2014 0408   HCT 24.8* 04/12/2014 1009   PLT 313 05/04/2014 0408   MCV 88.8 05/04/2014 0408   MCH 28.8 05/04/2014 0408   MCHC 32.4 05/04/2014 0408   RDW 13.7 05/04/2014 0408   LYMPHSABS 1.8 05/27/2013 0310   MONOABS 1.0 05/27/2013 0310   EOSABS 0.2 05/27/2013 0310   BASOSABS 0.0 05/27/2013 0310     Assessment: 1.  CKD 4 sec DM, excellent UO yest, renal fx stable 2. Volume overload 3. Anemia and fe def, receiving feraheme and aranesp 4. DM 5. OSA on CPAP 6. Sec HPTH 7. hypokalemia  Plan: 1. Decrease lasix to TID 2. Daily labs  3 start calcitriol 4. Replace K Jasmine [name] T

## 2014-05-04 NOTE — Progress Notes (Signed)
Subjective:  Pt seen and examined in AM. She had anxiety overnight and received ativan 1 mg with improvement of anxiety and improvement of sleep.  She reports she only has anxiety when she is hospitalized. She denies dyspnea, orthopnea, and has improved LE swelling. Her LE dopplers were negative for DVT.    Objective: Vital signs in last 24 hours: Filed Vitals:   05/03/14 1452 05/03/14 2218 05/04/14 0557 05/04/14 1028  BP: 160/67 145/62 150/66 153/57  Pulse: 78 77 78   Temp: 98.4 F (36.9 C) 98.5 F (36.9 C) 98.5 F (36.9 C)   TempSrc: Oral Oral Oral   Resp: 18 18 18    Height:      Weight:   289 lb 1.6 oz (131.135 kg)   SpO2: 96% 93% 100%    Weight change: -12 lb 11.2 oz (-5.761 kg)  Intake/Output Summary (Last 24 hours) at 05/04/14 1333 Last data filed at 05/04/14 1140  Gross per 24 hour  Intake   1251 ml  Output   5701 ml  Net  -4450 ml   PHYSICAL EXAMINATION:  General: NAD Heart: Normal rate and rhythm, 2/6 systolic murmur   Lungs: Decreased BS at bases. No crackles, wheezing, or ronchi  Abdomen: Soft, non-distended, non-tender, normal BS Extremities: Trace b/l non-pitting edema, abundant LE adipose tissue    Lab Results: Basic Metabolic Panel:  Recent Labs Lab 05/02/14 0945  05/03/14 0311 05/04/14 0408  NA 131*  < > 135 137  K 3.7  < > 3.7 3.3*  CL 98  < > 102 101  CO2 18*  < > 23 25  GLUCOSE 577*  < > 235* 190*  BUN 101*  < > 101* 101*  CREATININE 2.83*  < > 2.72* 2.67*  CALCIUM 8.3*  < > 8.6 9.3  MG 2.2  --  2.2  --   PHOS  --   --   --  5.5*  < > = values in this interval not displayed. Liver Function Tests:  Recent Labs Lab 05/04/14 0408  ALBUMIN 3.2*   CBC:  Recent Labs Lab 05/03/14 0311 05/04/14 0408  WBC 9.7 10.2  HGB 8.7* 9.2*  HCT 27.3* 28.4*  MCV 88.6 88.8  PLT 321 313   Cardiac Enzymes:  Recent Labs Lab 05/02/14 1309 05/02/14 1840  TROPONINI <0.03 <0.03   CBG:  Recent Labs Lab 05/03/14 0604 05/03/14 1135  05/03/14 1636 05/03/14 2200 05/04/14 0609 05/04/14 1137  GLUCAP 230* 215* 233* 200* 176* 170*   Anemia Panel:  Recent Labs Lab 05/02/14 1840  TIBC 215*  IRON 34*   Urine Drug Screen: Drugs of Abuse     Component Value Date/Time   LABOPIA NONE DETECTED 05/02/2014 1507   COCAINSCRNUR NONE DETECTED 05/02/2014 1507   LABBENZ NONE DETECTED 05/02/2014 1507   AMPHETMU NONE DETECTED 05/02/2014 1507   THCU NONE DETECTED 05/02/2014 1507   LABBARB NONE DETECTED 05/02/2014 1507    Alcohol Level: No results for input(s): ETH in the last 168 hours. Urinalysis:  Recent Labs Lab 05/02/14 0605  COLORURINE YELLOW  LABSPEC 1.012  PHURINE 5.0  GLUCOSEU 500*  HGBUR NEGATIVE  BILIRUBINUR NEGATIVE  KETONESUR NEGATIVE  PROTEINUR 100*  UROBILINOGEN 0.2  NITRITE NEGATIVE  LEUKOCYTESUR TRACE*     Micro Results: Recent Results (from the past 240 hour(s))  Urine culture     Status: None   Collection Time: 05/02/14  3:07 PM  Result Value Ref Range Status   Specimen Description URINE, RANDOM  Final   Special Requests NONE  Final   Colony Count NO GROWTH Performed at [facility]   Final   Culture NO GROWTH Performed at [facility]   Final   Report Status 05/03/2014 FINAL  Final   Studies/Results: No results found. Medications: I have reviewed the patient's current medications. Scheduled Meds: . amLODipine  10 mg Oral Daily  . aspirin EC  81 mg Oral Daily  . atorvastatin  80 mg Oral q1800  . calcitRIOL  0.25 mcg Oral Daily  . carvedilol  12.5 mg Oral BID WC  . Chlorhexidine Gluconate Cloth  6 each Topical Q0600  . clopidogrel  75 mg Oral Q breakfast  . darbepoetin (ARANESP) injection - NON-DIALYSIS  100 mcg Subcutaneous Q Fri-1800  . docusate sodium  100 mg Oral BID  . DULoxetine  60 mg Oral BID  . ferrous sulfate  325 mg Oral BID  . ferumoxytol  510 mg Intravenous Weekly  . furosemide  160 mg Intravenous TID  . insulin aspart  0-20 Units Subcutaneous  TID WC  . insulin aspart  0-5 Units Subcutaneous QHS  . insulin aspart  6 Units Subcutaneous TID WC  . insulin glargine  45 Units Subcutaneous QHS  . metoCLOPramide  10 mg Oral TID AC  . mupirocin ointment  1 application Nasal BID  . prenatal multivitamin  1 tablet Oral Q1200  . sodium chloride  3 mL Intravenous Q12H   Continuous Infusions:  PRN Meds:.albuterol, HYDROcodone-acetaminophen, nitroGLYCERIN Assessment/Plan: Principal Problem:   Acute on chronic diastolic congestive heart failure Active Problems:   Anemia, chronic disease   CKD (chronic kidney disease), stage IV   Gastroparesis   Morbid obesity   Diabetes mellitus type 2   OSA on CPAP   Hyperlipidemia   Aortic stenosis, mild   Hypertension   Coronary artery disease   Acute on chronic diastolic CHF (congestive heart failure)   Hyperglycemia due to type 2 diabetes mellitus   Acute on Chronic Grade 2 diastolic CHF - Pt with resolved dyspnea and orthopnea with improved LE swelling. Net 7.1 L with 17 lb weight loss. Last 2D-echo on 04/11/14 with grade 2 diastolic CHD and mild aortic stenosis with normal EF 55-60%. -Appreciate cardiology recommendations -Continue IV lasix 160 mg TID -Continue daily weights and strict I/Os -Repeat BNP -Monitor BMP and Mg levels  -Continue 1200 ml fluid restriction   Hypokalemia - K 3.3 this AM with normal Mg 2.2 yesterday. -Replete with kdur 40 mEq x 1  -Obtain Mg level   CKD Stage 4 - Cr 2.67 from 2.85 on admission near baseline 2.7-2.9. Pt may need HD in near future. PTH elevated at 233.  -Appreciate nephrology following -Monitor daily weights and strict I & O's -Monitor BMP -Avoid nephrotoxins  -Continue calcitriol 0.25 mcg daily  Hypertension -Currently normotensive. Pt at home on amlodipine 10 mg daily, coreg 6.25 mg BID, torsemide 100 mg BID, and metazolone 2.5 mg twice weekly  -Continue amlodipine 10 mg daily, carvedilol 12.5 mg BID, and IV lasix 160 mg  TID  Insulin-dependent Type 2 DM - Last A1c 9.8 on 12/04/14.  Pt at home on Lantus 60 U daily and Novolog 15 U TID. -Appreciate diabetes coordinator recommendations -Follow-up A1c  -Continue resistant SSI with meals and at bedtime -Continue Novolog 6 U TID with meals  -Continue Lantus 45 U daily  -Monitor blood glucose before meals and at bedtime  Nonobstructive CAD - No current anginal pain. -Continue aspirin 81 mg  daily, plavix 75 mg daily -Continue atorvastatin 80 mg daily and carvedilol 12.5 mg BID   Chronic Normocytic Anemia - Hg 9.2 above baseline 8. Tsat low at 16%.   -Follow-up ferritin level -Continue ferrous sulfate 325 mg BID  -Continue aranesp Q2 weeks on fridays and feraheme weekly  OSA - Pt reports compliance with CPAP at home -CPAP as tolerated at night   Anxiety and Depression - Currently with stable mood -Continue cymbalta 60 mg BID -Ativan 1 mg PRN bedtime   Diet: Heart/Carb with fluid restriciton DVT Ppx: SCD (dual AP) Code: Full   Dispo: Disposition is deferred at this time, awaiting improvement of current medical problems.  Anticipated discharge in approximately 3-5 day(s).   The patient does have a current PCP (Macy Mis, MD) and does need an [facility] hospital follow-up appointment after discharge.  The patient does not have transportation limitations that hinder transportation to clinic appointments.  .Services Needed at time of discharge: Y = Yes, Blank = No PT:   OT:   RN:   Equipment:   Other:     LOS: 2 days   Otis Brace, MD 05/04/2014, 1:33 PM

## 2014-05-04 NOTE — Progress Notes (Signed)
Pt refused to wear CPAP tonight. CPAP machine in room. Pt stated she would call if she changed her mind.

## 2014-05-04 NOTE — Progress Notes (Signed)
Patient Name: Jasmine [name] Date of Encounter: 05/04/2014     Principal Problem:   Acute on chronic diastolic congestive heart failure Active Problems:   Anemia, chronic disease   CKD (chronic kidney disease), stage IV   Gastroparesis   Morbid obesity   Diabetes mellitus type 2   OSA on CPAP   Hyperlipidemia   Aortic stenosis, mild   Hypertension   Coronary artery disease   Acute on chronic diastolic CHF (congestive heart failure)   Hyperglycemia due to type 2 diabetes mellitus    SUBJECTIVE  Admitted with severe volume overload and orthopnea. Significant clinical improvement. Still about 8 lb above dry weight.  No chest pain. No further orthopnea. Rhythm is NSR.  CURRENT MEDS . amLODipine  10 mg Oral Daily  . aspirin EC  81 mg Oral Daily  . atorvastatin  80 mg Oral q1800  . calcitRIOL  0.25 mcg Oral Daily  . carvedilol  12.5 mg Oral BID WC  . Chlorhexidine Gluconate Cloth  6 each Topical Q0600  . clopidogrel  75 mg Oral Q breakfast  . darbepoetin (ARANESP) injection - NON-DIALYSIS  100 mcg Subcutaneous Q Fri-1800  . docusate sodium  100 mg Oral BID  . DULoxetine  60 mg Oral BID  . ferrous sulfate  325 mg Oral BID  . ferumoxytol  510 mg Intravenous Weekly  . furosemide  160 mg Intravenous TID  . insulin aspart  0-20 Units Subcutaneous TID WC  . insulin aspart  0-5 Units Subcutaneous QHS  . insulin aspart  6 Units Subcutaneous TID WC  . insulin glargine  45 Units Subcutaneous QHS  . metoCLOPramide  10 mg Oral TID AC  . mupirocin ointment  1 application Nasal BID  . prenatal multivitamin  1 tablet Oral Q1200  . sodium chloride  3 mL Intravenous Q12H    OBJECTIVE  Filed Vitals:   05/03/14 1452 05/03/14 2218 05/04/14 0557 05/04/14 1028  BP: 160/67 145/62 150/66 153/57  Pulse: 78 77 78   Temp: 98.4 F (36.9 C) 98.5 F (36.9 C) 98.5 F (36.9 C)   TempSrc: Oral Oral Oral   Resp: 18 18 18    Height:      Weight:   289 lb 1.6 oz (131.135 kg)   SpO2: 96% 93%  100%     Intake/Output Summary (Last 24 hours) at 05/04/14 1059 Last data filed at 05/04/14 1000  Gross per 24 hour  Intake   1554 ml  Output   6400 ml  Net  -4846 ml   Filed Weights   05/02/14 1519 05/03/14 0501 05/04/14 0557  Weight: 301 lb 12.8 oz (136.896 kg) 299 lb (135.626 kg) 289 lb 1.6 oz (131.135 kg)    PHYSICAL EXAM  General: Pleasant, NAD. Neuro: Alert and oriented X 3. Moves all extremities spontaneously. Psych: Normal affect. HEENT:  Normal  Neck: Supple without bruits or JVD. Lungs:  Resp regular and unlabored, Decreased breath sounds at bases. Heart: RRR no s3, s4, there is a soft systolic murmur at aortic area. Abdomen: Soft, non-tender, non-distended, BS + x 4.  Extremities: No clubbing, cyanosis.  Trace edema.  Accessory Clinical Findings  CBC  Recent Labs  05/03/14 0311 05/04/14 0408  WBC 9.7 10.2  HGB 8.7* 9.2*  HCT 27.3* 28.4*  MCV 88.6 88.8  PLT 321 313   Basic Metabolic Panel  Recent Labs  05/02/14 0945  05/03/14 0311 05/04/14 0408  NA 131*  < > 135 137  K 3.7  < >  3.7 3.3*  CL 98  < > 102 101  CO2 18*  < > 23 25  GLUCOSE 577*  < > 235* 190*  BUN 101*  < > 101* 101*  CREATININE 2.83*  < > 2.72* 2.67*  CALCIUM 8.3*  < > 8.6 9.3  MG 2.2  --  2.2  --   PHOS  --   --   --  5.5*  < > = values in this interval not displayed. Liver Function Tests  Recent Labs  05/04/14 0408  ALBUMIN 3.2*   No results for input(s): LIPASE, AMYLASE in the last 72 hours. Cardiac Enzymes  Recent Labs  05/02/14 1309 05/02/14 1840  TROPONINI <0.03 <0.03   BNP Invalid input(s): POCBNP D-Dimer No results for input(s): DDIMER in the last 72 hours. Hemoglobin A1C No results for input(s): HGBA1C in the last 72 hours. Fasting Lipid Panel No results for input(s): CHOL, HDL, LDLCALC, TRIG, CHOLHDL, LDLDIRECT in the last 72 hours. Thyroid Function Tests No results for input(s): TSH, T4TOTAL, T3FREE, THYROIDAB in the last 72 hours.  Invalid  input(s): FREET3  TELE  NSR  ECG    Radiology/Studies  Dg Chest 2 View  05/02/2014   CLINICAL DATA:  Shortness of breath for a few days, bilateral lower extremity swelling. History of diastolic heart failure, morbid obesity, hypertension, diabetes, chronic kidney disease.  EXAM: CHEST  2 VIEW  COMPARISON:  Chest radiograph April 09, 2004  FINDINGS: The cardiac silhouette appears mildly enlarged, similar. Mediastinal silhouette is nonsuspicious. Interstitial prominence, patchy bibasilar airspace opacities. No pleural effusion. No pneumothorax. Large body habitus. Osseous structures are nonsuspicious.  IMPRESSION: Mild cardiomegaly. Interstitial prominence suggests pulmonary edema, confluent in the lung bases (less likely superimposed pneumonia).   Electronically Signed   By: Awilda Metro   On: 05/02/2014 06:23   Dg Chest Port 1 View  04/10/2014   CLINICAL DATA:  44 year old female with shortness of breath and abnormal pulmonary auscultation. Initial encounter.  EXAM: PORTABLE CHEST - 1 VIEW  COMPARISON:  12/03/2013 and earlier.  FINDINGS: Portable AP semi upright view at 1029 hours. Stable cardiac size and mediastinal contours. Mildly improved lung volumes. Basilar predominant but diffuse indistinct pulmonary opacity. This is less asymmetric than on the comparison. No pneumothorax, consolidation or pleural effusion identified.  IMPRESSION: Indistinct bilateral pulmonary opacity. Favor acute pulmonary edema over bilateral pneumonia.   Electronically Signed   By: Odessa Fleming M.D.   On: 04/10/2014 10:53    ASSESSMENT AND PLAN  1. Acute on chronic diastolic CHF: Echo 3/16 with EF 55-60%, mild AS, preserved RV. Volume status elevated despite escalating diuretics regimen at home. States that at home she was only taking her metolazone once a week.  Follow renal function closely.  2. CKD: Nephrology consulted. Nearing HD.  3. OSA: Severe, uses CPAP at home 4. Uncontrolled DM 5. Anemia - Iron  stores low. On iron.    Plan: Lasix has been decrease to TID by nephrology today. Signed, Cassell Clement MD

## 2014-05-05 LAB — RENAL FUNCTION PANEL
ANION GAP: 13 (ref 5–15)
Albumin: 3 g/dL — ABNORMAL LOW (ref 3.5–5.2)
BUN: 98 mg/dL — AB (ref 6–23)
CO2: 30 mmol/L (ref 19–32)
Calcium: 9.4 mg/dL (ref 8.4–10.5)
Chloride: 95 mmol/L — ABNORMAL LOW (ref 96–112)
Creatinine, Ser: 2.84 mg/dL — ABNORMAL HIGH (ref 0.50–1.10)
GFR calc non Af Amer: 19 mL/min — ABNORMAL LOW (ref >90)
GFR, EST AFRICAN AMERICAN: 22 mL/min — AB (ref >90)
Glucose, Bld: 88 mg/dL (ref 70–99)
PHOSPHORUS: 5.2 mg/dL — AB (ref 2.3–4.6)
POTASSIUM: 3.2 mmol/L — AB (ref 3.5–5.1)
SODIUM: 138 mmol/L (ref 135–145)

## 2014-05-05 LAB — GLUCOSE, CAPILLARY
GLUCOSE-CAPILLARY: 144 mg/dL — AB (ref 70–99)
Glucose-Capillary: 83 mg/dL (ref 70–99)
Glucose-Capillary: 141 mg/dL — ABNORMAL HIGH (ref 70–99)
Glucose-Capillary: 187 mg/dL — ABNORMAL HIGH (ref 70–99)

## 2014-05-05 LAB — FERRITIN: Ferritin: 740 ng/mL — ABNORMAL HIGH (ref 10–291)

## 2014-05-05 LAB — BRAIN NATRIURETIC PEPTIDE: B Natriuretic Peptide: 263.5 pg/mL — ABNORMAL HIGH (ref 0.0–100.0)

## 2014-05-05 LAB — MAGNESIUM: Magnesium: 2.1 mg/dL (ref 1.5–2.5)

## 2014-05-05 MED ORDER — POTASSIUM CHLORIDE CRYS ER 20 MEQ PO TBCR
20.0000 meq | EXTENDED_RELEASE_TABLET | Freq: Two times a day (BID) | ORAL | Status: AC
  Administered 2014-05-05 (×2): 20 meq via ORAL
  Filled 2014-05-05 (×2): qty 1

## 2014-05-05 MED ORDER — FUROSEMIDE 80 MG PO TABS
160.0000 mg | ORAL_TABLET | Freq: Three times a day (TID) | ORAL | Status: DC
  Administered 2014-05-05 – 2014-05-06 (×3): 160 mg via ORAL
  Filled 2014-05-05 (×5): qty 2

## 2014-05-05 NOTE — Progress Notes (Signed)
Subjective:  Pt seen and examined in AM. No acute events overnight. She denies dyspnea, orthopnea, and has improved LE swelling. She continues to make good urine output.    Objective: Vital signs in last 24 hours: Filed Vitals:   05/04/14 1330 05/04/14 2019 05/05/14 0549 05/05/14 1046  BP: 122/53 141/60 149/48 137/58  Pulse: 81 80 80   Temp: 98.4 F (36.9 C) 98 F (36.7 C) 98.1 F (36.7 C)   TempSrc: Oral Oral Oral   Resp: Height:      Weight:   278 lb 12.8 oz (126.463 kg)   SpO2: 98% 96% 98%    Weight change: -10 lb 4.8 oz (-4.672 kg)  Intake/Output Summary (Last 24 hours) at 05/05/14 1451 Last data filed at 05/05/14 1000  Gross per 24 hour  Intake    734 ml  Output   2400 ml  Net  -1666 ml   PHYSICAL EXAMINATION:  General: NAD Heart: Normal rate and rhythm, 2/6 systolic murmur   Lungs: Decreased BS at bases. No crackles, wheezing, or ronchi  Abdomen: Soft, non-distended, non-tender, normal BS Extremities: Trace b/l non-pitting edema, abundant LE adipose tissue    Lab Results: Basic Metabolic Panel:  Recent Labs Lab 05/03/14 0311 05/04/14 0408 05/05/14 0607  NA 135 137 138  K 3.7 3.3* 3.2*  CL 102 101 95*  CO2 GLUCOSE 235* 190* 88  BUN 101* 101* 98*  CREATININE 2.72* 2.67* 2.84*  CALCIUM 8.6 9.3 9.4  MG 2.2  --  2.1  PHOS  --  5.5* 5.2*   Liver Function Tests:  Recent Labs Lab 05/04/14 0408 05/05/14 0607  ALBUMIN 3.2* 3.0*   CBC:  Recent Labs Lab 05/03/14 0311 05/04/14 0408  WBC 9.7 10.2  HGB 8.7* 9.2*  HCT 27.3* 28.4*  MCV 88.6 88.8  PLT 321 313   Cardiac Enzymes:  Recent Labs Lab 05/02/14 1309 05/02/14 1840  TROPONINI <0.03 <0.03   CBG:  Recent Labs Lab 05/04/14 0609 05/04/14 1137 05/04/14 1653 05/04/14 2111 05/05/14 0554 05/05/14 1135  GLUCAP 176* 170* 171* 166* 83 144*   Anemia Panel:  Recent Labs Lab 05/02/14 1840 05/04/14 0408  FERRITIN  --  740*  TIBC 215*  --   IRON 34*  --     Urine Drug Screen: Drugs of Abuse     Component Value Date/Time   LABOPIA NONE DETECTED 05/02/2014 1507   COCAINSCRNUR NONE DETECTED 05/02/2014 1507   LABBENZ NONE DETECTED 05/02/2014 1507   AMPHETMU NONE DETECTED 05/02/2014 1507   THCU NONE DETECTED 05/02/2014 1507   LABBARB NONE DETECTED 05/02/2014 1507    Urinalysis:  Recent Labs Lab 05/02/14 0605  COLORURINE YELLOW  LABSPEC 1.012  PHURINE 5.0  GLUCOSEU 500*  HGBUR NEGATIVE  BILIRUBINUR NEGATIVE  KETONESUR NEGATIVE  PROTEINUR 100*  UROBILINOGEN 0.2  NITRITE NEGATIVE  LEUKOCYTESUR TRACE*     Micro Results: Recent Results (from the past 240 hour(s))  Urine culture     Status: None   Collection Time: 05/02/14  3:07 PM  Result Value Ref Range Status   Specimen Description URINE, RANDOM  Final   Special Requests NONE  Final   Colony Count NO GROWTH Performed at [facility]   Final   Culture NO GROWTH Performed at [facility]   Final   Report Status 05/03/2014 FINAL  Final   Studies/Results: No results found. Medications: I have reviewed the patient's current medications. Scheduled  Meds: . amLODipine  10 mg Oral Daily  . aspirin EC  81 mg Oral Daily  . atorvastatin  80 mg Oral q1800  . calcitRIOL  0.25 mcg Oral Daily  . carvedilol  12.5 mg Oral BID WC  . Chlorhexidine Gluconate Cloth  6 each Topical Q0600  . clopidogrel  75 mg Oral Q breakfast  . darbepoetin (ARANESP) injection - NON-DIALYSIS  100 mcg Subcutaneous Q Fri-1800  . docusate sodium  100 mg Oral BID  . DULoxetine  60 mg Oral BID  . ferrous sulfate  325 mg Oral BID  . ferumoxytol  510 mg Intravenous Weekly  . furosemide  160 mg Oral TID  . insulin aspart  0-20 Units Subcutaneous TID WC  . insulin aspart  0-5 Units Subcutaneous QHS  . insulin aspart  6 Units Subcutaneous TID WC  . insulin glargine  45 Units Subcutaneous QHS  . metoCLOPramide  10 mg Oral TID AC  . mupirocin ointment  1 application Nasal BID  . potassium  chloride  20 mEq Oral BID  . prenatal multivitamin  1 tablet Oral Q1200  . sodium chloride  3 mL Intravenous Q12H   Continuous Infusions:  PRN Meds:.albuterol, HYDROcodone-acetaminophen, LORazepam, nitroGLYCERIN Assessment/Plan: Principal Problem:   Acute on chronic diastolic congestive heart failure Active Problems:   Anemia, chronic disease   CKD (chronic kidney disease), stage IV   Gastroparesis   Morbid obesity   Diabetes mellitus type 2   OSA on CPAP   Hyperlipidemia   Aortic stenosis, mild   Hypertension   Coronary artery disease   Acute on chronic diastolic CHF (congestive heart failure)   Hyperglycemia due to type 2 diabetes mellitus   Acute on Chronic Grade 2 diastolic CHF - Pt with resolved dyspnea and orthopnea with improved LE swelling. Net negative 8.8 L with 28 lb weight loss. Repeat BNP reduced to 263 from 382 on admission. Last 2D-echo on 04/11/14 with grade 2 diastolic CHD and mild aortic stenosis with normal EF 55-60%. -Appreciate cardiology recommendations -Per nephrology transition IV lasix 160 mg TID to PO 160 mg TID  -Continue daily weights and strict I/Os -Monitor BMP and Mg levels  -Continue 1200 ml fluid restriction   Hypokalemia - K 3.2 this AM with normal Mg 2.1 in setting of aggressive diuresis.  -Replete with kdur 20 mEq BID x 2 doses   -Continue to monitor BMP   CKD Stage 4 - Cr 2.84  from 2.85 on admission near baseline 2.7-2.9. Pt may need HD in near future. 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  -Appreciate nephrology following -Monitor daily weights and strict I & O's -Monitor BMP -Avoid nephrotoxins  -Continue calcitriol 0.25 mcg daily  Hypertension -Currently normotensive. Pt at home on amlodipine 10 mg daily, coreg 6.25 mg BID, torsemide 100 mg BID, and metazolone 2.5 mg twice weekly  -Continue amlodipine 10 mg daily, carvedilol 12.5 mg BID, and PO lasix 160 mg TID  Insulin-dependent Type 2 DM - Last CBG 144  in AM. Last A1c 9.8 on 12/04/14.  Pt at home on Lantus 60 U daily and Novolog 15 U TID. -Appreciate diabetes coordinator recommendations -Follow-up A1c  -Continue resistant SSI with meals and at bedtime -Continue Novolog 6 U TID with meals  -Continue Lantus 45 U daily  -Monitor blood glucose before meals and at bedtime  Nonobstructive CAD - No current anginal pain. -Continue aspirin 81 mg daily, plavix 75 mg daily -Continue atorvastatin 80 mg daily and carvedilol 12.5  mg BID   Anemia of Chronic Disease - Hg 9.2 on 4/9 above baseline 8. Ferritin elevated at 740 with Tsat low at 16% consistent with ACD.  -Continue ferrous sulfate 325 mg BID  -Continue aranesp Q2 weeks on fridays and feraheme weekly  OSA - Pt reports compliance with CPAP at home -CPAP as tolerated at night   Anxiety and Depression - Currently with stable mood. -Continue cymbalta 60 mg BID -Ativan 1 mg PRN bedtime   Diet: Heart/Carb with fluid restriciton DVT Ppx: SCD (dual AP) Code: Full   Dispo: Disposition is deferred at this time, awaiting improvement of current medical problems.  Anticipated discharge in approximately 3-5 day(s).   The patient does have a current PCP (Macy Mis, MD) and does need an [facility] hospital follow-up appointment after discharge.  The patient does not have transportation limitations that hinder transportation to clinic appointments.  .Services Needed at time of discharge: Y = Yes, Blank = No PT:   OT:   RN:   Equipment:   Other:     LOS: 3 days   Otis Brace, MD 05/05/2014, 2:51 PM

## 2014-05-05 NOTE — Progress Notes (Signed)
Patient continues to refuse to wear CPAP at this time. Informed patient to have RN contact RT if she changes her mind.

## 2014-05-05 NOTE — Progress Notes (Signed)
S: Feels well, no new CO O:BP 149/48 mmHg  Pulse 80  Temp(Src) 98.1 F (36.7 C) (Oral)  Resp 18  Ht 5\' 7"  (1.702 m)  Wt 126.463 kg (278 lb 12.8 oz)  BMI 43.66 kg/m2  SpO2 98%  Intake/Output Summary (Last 24 hours) at 05/05/14 0817 Last data filed at 05/05/14 0600  Gross per 24 hour  Intake   3094 ml  Output   2801 ml  Net    293 ml   Weight change: -4.672 kg (-10 lb 4.8 oz) Gen: awake and alert CVS: RRR Resp: Decreased BS throughout Abd:+ BS NTND Ext: tr-1+ edema Tr presacral edema NEURO: CNI Ox3 no asterixis   . amLODipine  10 mg Oral Daily  . aspirin EC  81 mg Oral Daily  . atorvastatin  80 mg Oral q1800  . calcitRIOL  0.25 mcg Oral Daily  . carvedilol  12.5 mg Oral BID WC  . Chlorhexidine Gluconate Cloth  6 each Topical Q0600  . clopidogrel  75 mg Oral Q breakfast  . darbepoetin (ARANESP) injection - NON-DIALYSIS  100 mcg Subcutaneous Q Fri-1800  . docusate sodium  100 mg Oral BID  . DULoxetine  60 mg Oral BID  . ferrous sulfate  325 mg Oral BID  . ferumoxytol  510 mg Intravenous Weekly  . furosemide  160 mg Intravenous TID  . insulin aspart  0-20 Units Subcutaneous TID WC  . insulin aspart  0-5 Units Subcutaneous QHS  . insulin aspart  6 Units Subcutaneous TID WC  . insulin glargine  45 Units Subcutaneous QHS  . metoCLOPramide  10 mg Oral TID AC  . mupirocin ointment  1 application Nasal BID  . prenatal multivitamin  1 tablet Oral Q1200  . sodium chloride  3 mL Intravenous Q12H   No results found. BMET    Component Value Date/Time   NA 138 05/05/2014 0607   NA 135 04/03/2014 1045   K 3.2* 05/05/2014 0607   CL 95* 05/05/2014 0607   CO2 30 05/05/2014 0607   GLUCOSE 88 05/05/2014 0607   GLUCOSE 339* 04/03/2014 1045   BUN 98* 05/05/2014 0607   BUN 63* 04/03/2014 1045   CREATININE 2.84* 05/05/2014 0607   CALCIUM 9.4 05/05/2014 0607   GFRNONAA 19* 05/05/2014 0607   GFRAA 22* 05/05/2014 0607   CBC    Component Value Date/Time   WBC 10.2 05/04/2014  0408   RBC 3.20* 05/04/2014 0408   RBC 2.73* 10/11/2012 0400   HGB 9.2* 05/04/2014 0408   HCT 28.4* 05/04/2014 0408   HCT 24.8* 04/12/2014 1009   PLT 313 05/04/2014 0408   MCV 88.8 05/04/2014 0408   MCH 28.8 05/04/2014 0408   MCHC 32.4 05/04/2014 0408   RDW 13.7 05/04/2014 0408   LYMPHSABS 1.8 05/27/2013 0310   MONOABS 1.0 05/27/2013 0310   EOSABS 0.2 05/27/2013 0310   BASOSABS 0.0 05/27/2013 0310     Assessment: 1.  CKD 4 sec DM, excellent UO , renal fx stable 2. Volume overload 3. Anemia and fe def, receiving feraheme and aranesp 4. DM 5. OSA on CPAP 6. Sec HPTH 7. hypokalemia  Plan: 1. Replace K 2. Rec Change to PO lasix 160mg  TID 3. Recheck labs in AM Landri Dorsainvil T

## 2014-05-06 DIAGNOSIS — N184 Chronic kidney disease, stage 4 (severe): Secondary | ICD-10-CM

## 2014-05-06 LAB — CBC
HEMATOCRIT: 31.9 % — AB (ref 36.0–46.0)
Hemoglobin: 10.3 g/dL — ABNORMAL LOW (ref 12.0–15.0)
MCH: 28.5 pg (ref 26.0–34.0)
MCHC: 32.3 g/dL (ref 30.0–36.0)
MCV: 88.4 fL (ref 78.0–100.0)
Platelets: 343 10*3/uL (ref 150–400)
RBC: 3.61 MIL/uL — ABNORMAL LOW (ref 3.87–5.11)
RDW: 13.7 % (ref 11.5–15.5)
WBC: 11.3 10*3/uL — ABNORMAL HIGH (ref 4.0–10.5)

## 2014-05-06 LAB — HEMOGLOBIN A1C
HEMOGLOBIN A1C: 9.8 % — AB (ref 4.8–5.6)
MEAN PLASMA GLUCOSE: 235 mg/dL

## 2014-05-06 LAB — RENAL FUNCTION PANEL
ANION GAP: 10 (ref 5–15)
Albumin: 3.1 g/dL — ABNORMAL LOW (ref 3.5–5.2)
BUN: 99 mg/dL — ABNORMAL HIGH (ref 6–23)
CALCIUM: 9.4 mg/dL (ref 8.4–10.5)
CO2: 28 mmol/L (ref 19–32)
CREATININE: 3 mg/dL — AB (ref 0.50–1.10)
Chloride: 100 mmol/L (ref 96–112)
GFR calc non Af Amer: 18 mL/min — ABNORMAL LOW (ref >90)
GFR, EST AFRICAN AMERICAN: 21 mL/min — AB (ref >90)
GLUCOSE: 138 mg/dL — AB (ref 70–99)
PHOSPHORUS: 4.7 mg/dL — AB (ref 2.3–4.6)
Potassium: 3.4 mmol/L — ABNORMAL LOW (ref 3.5–5.1)
Sodium: 138 mmol/L (ref 135–145)

## 2014-05-06 LAB — GLUCOSE, CAPILLARY
GLUCOSE-CAPILLARY: 134 mg/dL — AB (ref 70–99)
GLUCOSE-CAPILLARY: 132 mg/dL — AB (ref 70–99)
Glucose-Capillary: 143 mg/dL — ABNORMAL HIGH (ref 70–99)

## 2014-05-06 MED ORDER — POTASSIUM CHLORIDE CRYS ER 20 MEQ PO TBCR
20.0000 meq | EXTENDED_RELEASE_TABLET | Freq: Two times a day (BID) | ORAL | Status: DC
Start: 2014-05-06 — End: 2014-05-15

## 2014-05-06 MED ORDER — POTASSIUM CHLORIDE CRYS ER 20 MEQ PO TBCR: 40.0000 meq | EXTENDED_RELEASE_TABLET | Freq: Once | ORAL | Status: DC

## 2014-05-06 MED ORDER — CALCITRIOL 0.25 MCG PO CAPS: 0.2500 ug | ORAL_CAPSULE | Freq: Every day | ORAL | Status: AC

## 2014-05-06 MED ORDER — POTASSIUM CHLORIDE CRYS ER 20 MEQ PO TBCR
20.0000 meq | EXTENDED_RELEASE_TABLET | Freq: Two times a day (BID) | ORAL | Status: DC
  Administered 2014-05-06: 20 meq via ORAL
  Filled 2014-05-06: qty 1

## 2014-05-06 NOTE — Progress Notes (Signed)
When [employer] company was called to transport patient home, the dispatcher informed me that it would be $28 instead of the $18 that had be written on the taxi voucher because she [place] from [facility]. Spoke with Lovette Clicheonna Crowder SW who gave the voucher. She instructed me to change the amount to $28.

## 2014-05-06 NOTE — Telephone Encounter (Signed)
Pt currently in hospital, will await discharge summary to confirm pts dose etc

## 2014-05-06 NOTE — Progress Notes (Signed)
Assessment: 1. CKD 4 sec DM, excellent UO , renal fx stable 2. Volume overload 3. Anemia and fe def, receiving feraheme and aranesp 4. DM 5. OSA on CPAP 6. Sec HPTH 7. hypokalemia  Plan: Agree with restarting OP torsemide at 100 mg BID with close OP f/u  Subjective: Interval History: Feels good  Objective: Vital signs in last 24 hours: Temp:  [98.3 F (36.8 C)-98.6 F (37 C)] 98.6 F (37 C) (04/11 0421) Pulse Rate:  [81-82] 82 (04/11 0421) Resp:  [18] 18 (04/11 0421) BP: (135-139)/(50-51) 135/51 mmHg (04/11 0421) SpO2:  [98 %-100 %] 100 % (04/11 0421) Weight:  [125.057 kg (275 lb 11.2 oz)] 125.057 kg (275 lb 11.2 oz) (04/11 0421) Weight change: -1.406 kg (-3 lb 1.6 oz)  Intake/Output from previous day: 04/10 0701 - 04/11 0700 In: 600 [P.O.:600] Out: 4150 [Urine:4150] Intake/Output this shift: Total I/O In: 240 [P.O.:240] Out: 350 [Urine:350]  General appearance: alert and cooperative GI: soft, non-tender; bowel sounds normal; no masses,  no organomegaly and no RUQ tendersness Extremities: NO CCE  No jugular dist  Lab Results:  Recent Labs  05/04/14 0408 05/06/14 0350  WBC 10.2 11.3*  HGB 9.2* 10.3*  HCT 28.4* 31.9*  PLT 313 343   BMET:  Recent Labs  05/05/14 0607 05/06/14 0350  NA 138 138  K 3.2* 3.4*  CL 95* 100  CO2 30 28  GLUCOSE 88 138*  BUN 98* 99*  CREATININE 2.84* 3.00*  CALCIUM 9.4 9.4   No results for input(s): PTH in the last 72 hours. Iron Studies:  Recent Labs  05/04/14 0408  FERRITIN 740*   Studies/Results: No results found.  Scheduled: . amLODipine  10 mg Oral Daily  . aspirin EC  81 mg Oral Daily  . atorvastatin  80 mg Oral q1800  . calcitRIOL  0.25 mcg Oral Daily  . carvedilol  12.5 mg Oral BID WC  . Chlorhexidine Gluconate Cloth  6 each Topical Q0600  . clopidogrel  75 mg Oral Q breakfast  . darbepoetin (ARANESP) injection - NON-DIALYSIS  100 mcg Subcutaneous Q Fri-1800  . docusate sodium  100 mg Oral BID  .  DULoxetine  60 mg Oral BID  . ferrous sulfate  325 mg Oral BID  . ferumoxytol  510 mg Intravenous Weekly  . furosemide  160 mg Oral TID  . insulin aspart  0-20 Units Subcutaneous TID WC  . insulin aspart  0-5 Units Subcutaneous QHS  . insulin aspart  6 Units Subcutaneous TID WC  . insulin glargine  45 Units Subcutaneous QHS  . metoCLOPramide  10 mg Oral TID AC  . mupirocin ointment  1 application Nasal BID  . potassium chloride  20 mEq Oral BID  . prenatal multivitamin  1 tablet Oral Q1200  . sodium chloride  3 mL Intravenous Q12H      LOS: 4 days   Jasmine [name] C 05/06/2014,12:45 PM  .

## 2014-05-06 NOTE — Discharge Instructions (Signed)
It was a pleasure taking care of you, Jasmine [name].   Please resume your home Torsemide 100 mg twice a day.   Take K-Dur 20 mEq twice a day.   You have a follow up appointment with Dr. Earnest BaileyBriscoe on Marisel 19th at 10 AM. Please make sure they check your electrolytes at that visit.   Take care,  Dr. Beckie Saltsivet

## 2014-05-06 NOTE — Progress Notes (Signed)
Advanced Heart Failure Rounding Note   Subjective:     44 y.o [place] American female with history of morbid obesity, nonobstructive CAD per cath in 2010, HTN, OSA, uncontrolled DM type 1, class III CKD and diastolic HF, presenting with dyspnea and weight gain in the setting of acute on chronic diastolic CHF not responding to increased diuretic regimen and worsening renal function.   She has been followed cloesly in the HF clinic and was last on 4/5 for follow up with volume overload noted. She had ongoing volume overload and she was instructed to increase torsemide to 100 mg twice daily and return the next for IV lasix. She returned for RN visit on 4/6 for 120 mg IV lasix. She had less than 500 cc urine output and at that point Dr Shirlee Latch requested follow up with nephrology ASAP.   Admitted with increased dyspnea and hyperglycemia.   Diuresed with IV lasix and now on po 160 mg lasix tid.  Nephrology following.  Overall diuresed 31 pounds.   Denies SOB. Feeling better.       Creatinine 2.7> 2.72>3.0   Objective:   Weight Range:  Vital Signs:   Temp:  [98.3 F (36.8 C)] 98.3 F (36.8 C) (04/10 1300) Pulse Rate:  [81] 81 (04/10 1300) Resp:  [18] 18 (04/10 1300) BP: (137-139)/(50-58) 139/50 mmHg (04/10 1300) SpO2:  [98 %] 98 % (04/10 1300) Last BM Date: 05/01/14  Weight change: Filed Weights   05/03/14 0501 05/04/14 0557 05/05/14 0549  Weight: 299 lb (135.626 kg) 289 lb 1.6 oz (131.135 kg) 278 lb 12.8 oz (126.463 kg)    Intake/Output:   Intake/Output Summary (Last 24 hours) at 05/06/14 0743 Last data filed at 05/06/14 0656  Gross per 24 hour  Intake    600 ml  Output   4150 ml  Net  -3550 ml    PHYSICAL EXAM: General: No resp difficulty. In chair.  HEENT: normal Neck: supple. JVP does not appear elevated. Carotids 2+ bilaterally; no bruits. No lymphadenopathy or thryomegaly appreciated. Cor: PMI normal. Regular rate & rhythm. No rubs, gallops. 2/6 early SEM RUSB.   Lungs: clear. In bed.  Abdomen: obese, soft, nontender, +distended. No hepatosplenomegaly. No bruits or masses. Good bowel sounds. Extremities: no cyanosis, clubbing, rash. R and LLE trace edema to knees bilaterally.  Neuro: alert & orientedx3, cranial nerves grossly intact. Moves all 4 extremities w/o difficulty. Affect pleasa   Telemetry: SR 80s   Labs: Basic Metabolic Panel:  Recent Labs Lab 05/02/14 0945 05/02/14 1309 05/03/14 0311 05/04/14 0408 05/05/14 0607 05/06/14 0350  NA 131* 130* 135 137 138 138  K 3.7 3.8 3.7 3.3* 3.2* 3.4*  CL 98 94* 102 101 95* 100  CO2 18* GLUCOSE 577* 294* 235* 190* 88 138*  BUN 101* 102* 101* 101* 98* 99*  CREATININE 2.83* 2.70* 2.72* 2.67* 2.84* 3.00*  CALCIUM 8.3* 8.5 8.6 9.3 9.4 9.4  MG 2.2  --  2.2  --  2.1  --   PHOS  --   --   --  5.5* 5.2* 4.7*    Liver Function Tests:  Recent Labs Lab 05/04/14 0408 05/05/14 0607 05/06/14 0350  ALBUMIN 3.2* 3.0* 3.1*   No results for input(s): LIPASE, AMYLASE in the last 168 hours. No results for input(s): AMMONIA in the last 168 hours.  CBC:  Recent Labs Lab 05/02/14 0555 05/03/14 0311 05/04/14 0408 05/06/14 0350  WBC 13.6* 9.7 10.2 11.3*  HGB 9.1*  8.7* 9.2* 10.3*  HCT 28.1* 27.3* 28.4* 31.9*  MCV 88.9 88.6 88.8 88.4  PLT 316 321 313 343    Cardiac Enzymes:  Recent Labs Lab 05/02/14 1309 05/02/14 1840  TROPONINI <0.03 <0.03    BNP: BNP (last 3 results)  Recent Labs  04/22/14 1027 05/02/14 0555 05/05/14 0607  BNP 244.5* 382.7* 263.5*    ProBNP (last 3 results)  Recent Labs  05/25/13 0545 12/03/13 0305  PROBNP 3258.0* 2451.0*      Other results:  Imaging: No results found.   Medications:     Scheduled Medications: . amLODipine  10 mg Oral Daily  . aspirin EC  81 mg Oral Daily  . atorvastatin  80 mg Oral q1800  . calcitRIOL  0.25 mcg Oral Daily  . carvedilol  12.5 mg Oral BID WC  . Chlorhexidine Gluconate  Cloth  6 each Topical Q0600  . clopidogrel  75 mg Oral Q breakfast  . darbepoetin (ARANESP) injection - NON-DIALYSIS  100 mcg Subcutaneous Q Fri-1800  . docusate sodium  100 mg Oral BID  . DULoxetine  60 mg Oral BID  . ferrous sulfate  325 mg Oral BID  . ferumoxytol  510 mg Intravenous Weekly  . furosemide  160 mg Oral TID  . insulin aspart  0-20 Units Subcutaneous TID WC  . insulin aspart  0-5 Units Subcutaneous QHS  . insulin aspart  6 Units Subcutaneous TID WC  . insulin glargine  45 Units Subcutaneous QHS  . metoCLOPramide  10 mg Oral TID AC  . mupirocin ointment  1 application Nasal BID  . potassium chloride  20 mEq Oral BID  . prenatal multivitamin  1 tablet Oral Q1200  . sodium chloride  3 mL Intravenous Q12H    Infusions:    PRN Medications: albuterol, HYDROcodone-acetaminophen, LORazepam, nitroGLYCERIN   Assessment:   1. Acute on chronic diastolic CHF: Echo 3/16 with EF 55-60%, mild AS, preserved RV. Volume status elevated despite escalating diuretics regimen at home. Continue lasix 120 mg IV every 6 hours + 10 metolazone today.  Follow renal function closely.  2. CKD: Nephrology consulted. Nearing HD.  3. OSA: Severe, uses CPAP at home 4. Uncontrolled DM 5. Anemia - Iron stores low. On iron.   Plan/Discussion:   Appears euvolemic. Continue lasix 160 mg tid per nephrology. Overall she has diuresed 31 pounds. Should be ok to home. Will make follow up for next week in HF clinic.   Iron stores low. Continue ferrous sulfate bid. On stool softener.   Length of Stay: 4  CLEGG,AMY NP-C  05/06/2014, 7:43 AM  Advanced Heart Failure Team Pager [phone] (M-F; 7a - 4p)  Please contact [facility] for night-coverage after hours (4p -7a ) and weekends on [email or web address]  Patient seen with NP, agree with the above note.  BUN/creatinine remain high but she has diuresed well.  Looking good by exam today, fluid status improved.  We will sign off, will arrange followup.    Marca AnconaDalton McLean 05/06/2014 8:07 AM

## 2014-05-06 NOTE — Progress Notes (Signed)
NURSING PROGRESS NOTE  Jasmine [name] [id number] Discharge Data: 05/06/2014 6:10 PM Attending Provider: Bertha Stakes, MD [id number], Maudie Mercury, MD     Weslyn L Renshaw to be D/C'd Home per MD order.  Discussed with the patient the After Visit Summary and all questions fully answered. All IV's discontinued with no bleeding noted. All belongings returned to patient for patient to take home. Patient displayed knowledge of her disease process. She refused some meds prior to d/c because she normally take them at night.   Last Vital Signs:  Blood pressure 144/60, pulse 77, temperature 98.2 F (36.8 C), temperature source Oral, resp. rate 18, height $RemoveBe'5\' 7"'[id number]$  (1.702 m), weight 125.057 kg (275 lb 11.2 oz), SpO2 100 %.  Discharge Medication List   Medication List    TAKE these medications        acetaminophen 500 MG tablet  Commonly known as:  TYLENOL  Take 500 mg by mouth every 6 (six) hours as needed for mild pain.     albuterol 108 (90 BASE) MCG/ACT inhaler  Commonly known as:  PROVENTIL HFA;VENTOLIN HFA  Inhale 2 puffs into the lungs every 6 (six) hours as needed for wheezing.     amLODipine 10 MG tablet  Commonly known as:  NORVASC  Take 10 mg by mouth daily.     aspirin 81 MG tablet  Take 81 mg by mouth daily. For pain     atorvastatin 80 MG tablet  Commonly known as:  LIPITOR  Take 1 tablet (80 mg total) by mouth daily at 6 PM.     calcitRIOL 0.25 MCG capsule  Commonly known as:  ROCALTROL  Take 1 capsule (0.25 mcg total) by mouth daily.     capsicum 0.075 % topical cream  Commonly known as:  ZOSTRIX  Apply 1 application topically 3 (three) times daily. Apply to feet up to 3x daily     carvedilol 6.25 MG tablet  Commonly known as:  COREG  TAKE 2 TABLETS (12.5 MG TOTAL) BY MOUTH 2 (TWO) TIMES DAILY WITH A MEAL.     clopidogrel 75 MG tablet  Commonly known as:  PLAVIX  Take 75 mg by mouth daily with breakfast.     DULoxetine 60 MG capsule  Commonly known as:  CYMBALTA  Take 1  capsule (60 mg total) by mouth 2 (two) times daily.     ferrous sulfate 325 (65 FE) MG tablet  Take 325 mg by mouth 2 (two) times daily.     glucose blood test strip  Check glucose TID and prn.     HYDROcodone-acetaminophen 5-325 MG per tablet  Commonly known as:  NORCO  Take 1 tablet by mouth every 6 (six) hours as needed for moderate pain.     insulin glargine 100 UNIT/ML injection  Commonly known as:  LANTUS  Inject 60 Units into the skin at bedtime.     insulin lispro 100 UNIT/ML injection  Commonly known as:  HUMALOG  Inject 15 Units into the skin 3 (three) times daily before meals.     metoCLOPramide 10 MG tablet  Commonly known as:  REGLAN  Take 10 mg by mouth 3 (three) times daily before meals. One po qac     metolazone 2.5 MG tablet  Commonly known as:  ZAROXOLYN  Take 1 tablet (2.5 mg total) by mouth 2 (two) times a week. Every Mon and Fri     nitroGLYCERIN 0.4 MG SL tablet  Commonly known as:  NITROSTAT  Place 1  tablet (0.4 mg total) under the tongue every 5 (five) minutes as needed for chest pain.     ONE TOUCH ULTRA 2 W/DEVICE Kit  Check CBG's TID and prn     potassium chloride SA 20 MEQ tablet  Commonly known as:  K-DUR,KLOR-CON  Take 1 tablet (20 mEq total) by mouth 2 (two) times daily. On Mondays take an additional 40 MeQ daily     prenatal multivitamin Tabs tablet  Take 1 tablet by mouth daily at 12 noon.     torsemide 20 MG tablet  Commonly known as:  DEMADEX  Take 5 tablets (100 mg total) by mouth 2 (two) times daily.

## 2014-05-06 NOTE — Progress Notes (Addendum)
CSW notified by patient's nurse Champ MungoGiselle that patient is being d/c'd today. She does not have transportation; her boyfriend lives in [place] and is working; she does not have any family in [place] or anyone to come and pick her up. Patient related that she has no money to pay for a cab and the walk from the bus stop to her home is extensive and due to her medical conditions she would not be safe to take a bus. CSW arranged for a Cab voucher for [employer].  Patient's nurse instructed to call for cab once patient was medically stable for d/c.  Patient was very appreciative of CSW's assistance and CSW discussed with patient that should she have future hospitalizations- she needs to work on arranging home transportation. She verbalized understanding of above.  CSW signing off - no further CSW needs identified.  Lorri Frederickonna T. Jaci LazierCrowder, KentuckyLCSW [phone]

## 2014-05-06 NOTE — Progress Notes (Signed)
Subjective: Patient has no complaints today. She denies dyspnea. She reports her leg swelling has significantly improved. Appears euvolemic overall. She has diuresed well on Lasix 160 mg PO TID with 3.5 L overnight and weight down another 3 lbs (31 lbs total).   Objective: Vital signs in last 24 hours: Filed Vitals:   05/05/14 0549 05/05/14 1046 05/05/14 1300 05/06/14 0421  BP: 149/48 137/58 139/50 135/51  Pulse: 80  81 82  Temp: 98.1 F (36.7 C)  98.3 F (36.8 C) 98.6 F (37 C)  TempSrc: Oral  Oral Oral  Resp: 18  18 18   Height:      Weight: 278 lb 12.8 oz (126.463 kg)   275 lb 11.2 oz (125.057 kg)  SpO2: 98%  98% 100%   Weight change: -3 lb 1.6 oz (-1.406 kg)  Intake/Output Summary (Last 24 hours) at 05/06/14 0821 Last data filed at 05/06/14 0656  Gross per 24 hour  Intake    600 ml  Output   4150 ml  Net  -3550 ml   Physical Exam General: sitting up in chair eating breakfast, NAD HEENT: [place]/AT, EOMI, mucus membranes moist Neck: no JVD appreciated CV: RRR, 2/6 systolic murmur heard best at RUSB Pulm: CTA bilaterally, breaths non-labored  Abd: BS+, soft, obese, non-tender Ext: warm, trace edema bilaterally  Neuro: alert and oriented x 3, no focal deficits  Lab Results: Basic Metabolic Panel:  Recent Labs Lab 05/03/14 0311  05/05/14 0607 05/06/14 0350  NA 135  < > 138 138  K 3.7  < > 3.2* 3.4*  CL 102  < > 95* 100  CO2 23  < > 30 28  GLUCOSE 235*  < > 88 138*  BUN 101*  < > 98* 99*  CREATININE 2.72*  < > 2.84* 3.00*  CALCIUM 8.6  < > 9.4 9.4  MG 2.2  --  2.1  --   PHOS  --   < > 5.2* 4.7*  < > = values in this interval not displayed. Liver Function Tests:  Recent Labs Lab 05/05/14 0607 05/06/14 0350  ALBUMIN 3.0* 3.1*   CBC:  Recent Labs Lab 05/04/14 0408 05/06/14 0350  WBC 10.2 11.3*  HGB 9.2* 10.3*  HCT 28.4* 31.9*  MCV 88.8 88.4  PLT 313 343    CBG:  Recent Labs Lab 05/04/14 2111 05/05/14 0554 05/05/14 1135 05/05/14 1630  05/05/14 2121 05/06/14 0555  GLUCAP 166* 83 144* 141* 187* 134*   Anemia Panel:  Recent Labs Lab 05/02/14 1840 05/04/14 0408  FERRITIN  --  740*  TIBC 215*  --   IRON 34*  --    Medications: I have reviewed the patient's current medications.  Assessment/Plan:  Acute on Chronic CHF: Patient's dyspnea and orthopnea have resolved with significantly improved leg swelling. She is diuresing well with 3.5 L overnight and 12.4 L since admission. Her weight is down 31 lbs total. Her last 2D Echo on 04/11/14 showed grade 2 diastolic dysfunction with normal EF 55-60%.  - Appreciate heart failure recommendations. Will have follow up appt scheduled.  - Appreciate nephrology following- recommend discharging on home Torsemide 100 mg BID. Will discharge on KCl 20 mEq BID. Will need repeat bmet in 1 week at PCP's office. Appointment set up.  - Continue daily weights and strict I&Os - Continue 1200 ml fluid restriction   Hypokalemia: K 3.4 this AM in setting of aggressive diuresis.  - Replete with K-Dur 20 mEq BID - Likely will need home  potassium supplementation with high dose Lasix   CKD Stage 4: Cr 3.00 today, up from 2.85 on admission. Her baseline is 2.7-2.9. Likely due to aggressive diuresis. Her UOP is excellent. PTH elevated at 233 which is consistent with secondary hyperparathyroidism.  - Appreciate Nephrology recommendations - Continue daily weights and strict I&Os - Moniotr bmet - Avoid nephrotoxins - Continue calcitriol 0.25 mcg daily   HTN: BP 135/51 today.  - Continue Amlodipine 10 mg daily - Continue Carvedilol 12.5 mg BID - Continue Lasix 160 mg PO TID  Insulin-dependent Type 2 DM: Last HbA1c 9.8 on 12/04/14. Repeat HbA1c pending. Blood sugars have been stable in 130s-180s. Patient takes Lantus 60 units daily and Novolog 15 units TID at home.  - Follow up HbA1c - Continue resistant sliding scale with meals and bedtime - Continue Novolog 6 units TID with meals - Continue  Lantus 45 units daily - Monitor blood sugars before meals and at bedtime    Nonobstructive CAD: No anginal pain.  - Continue aspirin 81 mg daily - Continue Plavix 75 mg daily - Continue Atorvastatin 80 mg daily - Continue Carvedilol 12.5 mg BID   Anemia of Chronic Disease: Hbg 10.3 today. Baseline ~8. Anemia panel consistent with anemia of chronic disease (ferritin 740, Tsat low at 16%).  - Continue ferrous sulfate 325 mg BID - Continue aranesp Q2 weeks on Fridays and feraheme weekly   OSA: Patient uses CPAP at home. - Continue CPAP at night   Anxiety and Depression: Stable. - Continue Cymbalta 60 mg BID - Continue Ativan 1 mg PRN QHS  Diet: Carb modified, 1200 ml fluid restriction VTE PPx: SCDs Dispo: Discharge likely today  The patient does have a current PCP Macy Mis, MD) and does need an [facility] hospital follow-up appointment after discharge.  The patient does not have transportation limitations that hinder transportation to clinic appointments.  .Services Needed at time of discharge: Y = Yes, Blank = No PT:   OT:   RN:   Equipment:   Other:     LOS: 4 days   Su Hoff, MD 05/06/2014, 8:21 AM

## 2014-05-07 ENCOUNTER — Encounter (HOSPITAL_COMMUNITY): Payer: Self-pay

## 2014-05-07 ENCOUNTER — Ambulatory Visit (HOSPITAL_COMMUNITY): Admit: 2014-05-07 | Payer: 59 | Admitting: Internal Medicine

## 2014-05-07 SURGERY — RIGHT HEART CATH
Anesthesia: LOCAL

## 2014-05-08 ENCOUNTER — Ambulatory Visit: Payer: 59 | Admitting: Neurology

## 2014-05-09 ENCOUNTER — Encounter: Payer: Self-pay | Admitting: Neurology

## 2014-05-15 ENCOUNTER — Ambulatory Visit (HOSPITAL_COMMUNITY)
Admission: RE | Admit: 2014-05-15 | Discharge: 2014-05-15 | Disposition: A | Discharge status: Home / Self Care | Payer: 59 | Source: Ambulatory Visit | Attending: Cardiology | Admitting: Cardiology

## 2014-05-15 ENCOUNTER — Other Ambulatory Visit (HOSPITAL_COMMUNITY): Payer: Self-pay | Admitting: Internal Medicine

## 2014-05-15 VITALS — BP 152/72 | HR 92 | Wt 275.2 lb

## 2014-05-15 DIAGNOSIS — G4733 Obstructive sleep apnea (adult) (pediatric): Secondary | ICD-10-CM

## 2014-05-15 DIAGNOSIS — N184 Chronic kidney disease, stage 4 (severe): Secondary | ICD-10-CM

## 2014-05-15 DIAGNOSIS — I5032 Chronic diastolic (congestive) heart failure: Secondary | ICD-10-CM

## 2014-05-15 DIAGNOSIS — Z9989 Dependence on other enabling machines and devices: Secondary | ICD-10-CM

## 2014-05-15 LAB — BASIC METABOLIC PANEL
ANION GAP: 13 (ref 5–15)
BUN: 105 mg/dL — AB (ref 6–23)
CO2: 24 mmol/L (ref 19–32)
Calcium: 9.3 mg/dL (ref 8.4–10.5)
Chloride: 91 mmol/L — ABNORMAL LOW (ref 96–112)
Creatinine, Ser: 3.4 mg/dL — ABNORMAL HIGH (ref 0.50–1.10)
GFR, EST AFRICAN AMERICAN: 18 mL/min — AB (ref >90)
GFR, EST NON AFRICAN AMERICAN: 15 mL/min — AB (ref >90)
Glucose, Bld: 478 mg/dL — ABNORMAL HIGH (ref 70–99)
POTASSIUM: 3.3 mmol/L — AB (ref 3.5–5.1)
Sodium: 128 mmol/L — ABNORMAL LOW (ref 135–145)

## 2014-05-15 MED ORDER — CARVEDILOL 12.5 MG PO TABS
25.0000 mg | ORAL_TABLET | Freq: Two times a day (BID) | ORAL | Status: DC
Start: 2014-05-15 — End: 2014-05-15

## 2014-05-15 MED ORDER — CARVEDILOL 12.5 MG PO TABS: 18.7500 mg | ORAL_TABLET | Freq: Two times a day (BID) | ORAL | Status: DC

## 2014-05-15 NOTE — Progress Notes (Signed)
Patient ID: Jasmine [name], female   DOB: 1970/09/19, 44 y.o.   MRN: [id number] PCP: Dr. Doreene Nest Nephrology: Dr Marval Regal Pulmonary: Dr. Elsworth Soho  HPI: [name] is a 44 y.o African American female with history of morbid obesity, nonobstructive CAD per cath 2010, HTN, OSA, uncontrolled DM type 1, stage IV CKD and diastolic HF.    Admitted 05/25/13-05/30/13 for L shoulder pain, diabetic ketoacidosis and NSTEMI. Had nuclear test showing fixed anteroapical defect and no ischemia. VQ scan was negative. Started on Plavix for medical management of ?ACS.   Admitted 3/16 through 04/15/14 with volume overload. Diuresed with IV lasix and transitioned back to torsemide 80 mg twice a day. Discharge weight was 289 pounds.   She was admitted again in 4/16 with intractable volume overload and worsening renal function despite getting IV Lasix in clinic.  She was seen by nephrology in the hospital and diuresed with high dose Lasix with loss of 31 lbs.   She returns today for followup. Weight has been stable since getting home. She is taking torsemide 100 mg bid and metolazone twice a week. She is short of breath walking up a flight of steps but has been ok on flat ground.  No orthopnea or PND.  No chest pain.  BP high today.   - V/Q scan on November 02, 2010, showing normal ventilation perfusion without evidence of PE.  - CT of the chest without contrast 10/2010 showing suspected multifocal alveolar edema in the bilateral upper lobes and superior left lower lobe. Multifocal infection considered less likely. Cardiomegaly with bilateral pleural effusions. No findings to suggest interstitial lung disease.  - 11/2010 CPX Peak VO2: 10.73ml/kg/min % predicted peak VO2: 57.2% (corrects to 19.6 for ideal body weight), VE/VCO2 slope: 52 (to peak exercise) 43 (to RCP), OUES: 1.08, Peak RER: 1.19, Ventilatory Threshold: 7.2 % predicted peak VO2: 37.6%, O2pulse: 12 % predicted O2pulse: 90% - Sleep study completed 11/25/2010. Dr Elsworth Soho  evaluated severe sleep apnea, placed on CPAP.  ECHOs (09/2012): EF 55-60% with grade II diastolic dysfx ECHOs (09/1476): EF 55-60% with grade II DD  Labs     10/25/12: K+ 4.8, Cr 3.06 (ramipril stopped)     11/01/12: K+ 5.0  Cr 1.8 (Baseline 2.3)     08/08/13 K 4.1 Creatinine 1.8       04/22/2014: K 3.8 Creatinine 2.32  BNP 244      4/16: K 3.4, creatinine 3  SH: Works FT at [employer]. Lives with boyfriend in [place]. Occassional ETOH. No tobacco abuse or drug abuse.   FH: Mother deceased: CAD, HTN, DM2, TIAs        Father living: Alzheimers, HIV   ROS: All systems negative except as listed in HPI, PMH and Problem List.  Past Medical History  Diagnosis Date  . Diastolic heart failure     a. 2D ECHO on 04/11/2014 w/ EF: 55- 60%, mild LVH. No RWMA, G2DD, mild AS, MIld MR, mild LAE  . Hypertensive heart disease   . Hyperlipidemia   . Anemia   . Morbid obesity   . Coronary artery disease      a. Cath 2010: non obstructive CAD (30% LAD proximal, mid and distal lesions, 60% diagonal, 30% Dominant mid circumflex) b. Lexiscan Myoview (05/2013) Fixed anteroapical scar of medium sixe, no reversible ischemia  . Carpal tunnel syndrome   . Abscess of anal and rectal regions   . Hypertension   . Aortic stenosis, mild   . Pneumonia     "  once or twice" (05/02/2014)  . OSA on CPAP   . Diabetic gastroparesis   . Diabetes mellitus type 1 dx'd age 35  . History of blood transfusion     "related to my anemia"  . Diabetic nephropathy   . Chronic kidney disease (CKD), stage IV (severe)     Current Outpatient Prescriptions  Medication Sig Dispense Refill  . acetaminophen (TYLENOL) 500 MG tablet Take 500 mg by mouth every 6 (six) hours as needed for mild pain.    Marland Kitchen albuterol (PROVENTIL HFA;VENTOLIN HFA) 108 (90 BASE) MCG/ACT inhaler Inhale 2 puffs into the lungs every 6 (six) hours as needed for wheezing. 1 Inhaler 2  . amLODipine (NORVASC) 10 MG tablet Take 10 mg by mouth daily.    Marland Kitchen aspirin 81 MG  tablet Take 81 mg by mouth daily. For pain    . atorvastatin (LIPITOR) 80 MG tablet Take 1 tablet (80 mg total) by mouth daily at 6 PM. 30 tablet 2  . Blood Glucose Monitoring Suppl (ONE TOUCH ULTRA 2) W/DEVICE KIT Check CBG's TID and prn    . calcitRIOL (ROCALTROL) 0.25 MCG capsule Take 1 capsule (0.25 mcg total) by mouth daily. 30 capsule 3  . carvedilol (COREG) 12.5 MG tablet Take 1.5 tablets (18.75 mg total) by mouth 2 (two) times daily with a meal. 90 tablet 3  . clopidogrel (PLAVIX) 75 MG tablet Take 75 mg by mouth daily with breakfast.  9  . DULoxetine (CYMBALTA) 60 MG capsule Take 1 capsule (60 mg total) by mouth 2 (two) times daily. 60 capsule 6  . ferrous sulfate 325 (65 FE) MG tablet Take 325 mg by mouth 2 (two) times daily.      Marland Kitchen glucose blood test strip Check glucose TID and prn.    . insulin glargine (LANTUS) 100 UNIT/ML injection Inject 60 Units into the skin at bedtime.     . insulin lispro (HUMALOG) 100 UNIT/ML injection Inject 15 Units into the skin 3 (three) times daily before meals.     . metoCLOPramide (REGLAN) 10 MG tablet Take 10 mg by mouth 3 (three) times daily before meals. One po qac    . metolazone (ZAROXOLYN) 2.5 MG tablet Take 1 tablet (2.5 mg total) by mouth 2 (two) times a week. Every Mon and Fri 12 tablet 3  . nitroGLYCERIN (NITROSTAT) 0.4 MG SL tablet Place 1 tablet (0.4 mg total) under the tongue every 5 (five) minutes as needed for chest pain. 25 tablet 12  . potassium chloride SA (K-DUR,KLOR-CON) 20 MEQ tablet Take 40 mEq by mouth 2 (two) times daily. Except on Mon and Fri take 60 mg Twice daily    . Prenatal Vit-Fe Fumarate-FA (PRENATAL MULTIVITAMIN) TABS tablet Take 1 tablet by mouth daily at 12 noon.    . torsemide (DEMADEX) 20 MG tablet Take 5 tablets (100 mg total) by mouth 2 (two) times daily. 300 tablet 3   No current facility-administered medications for this encounter.     Filed Vitals:   05/15/14 0917  BP: 152/72  Pulse: 92  Weight: 275 lb 4  oz (124.853 kg)  SpO2: 100%    PHYSICAL EXAM: General:  Well appearing. No resp difficulty HEENT: normal Neck: Thick. JVP 7 cm.  Carotids 2+ bilaterally; no bruits. No lymphadenopathy or thryomegaly appreciated. Cor: PMI normal. Regular rate & rhythm. No rubs, gallops.  1/6 early SEM RUSB.  Lungs: clear Abdomen: obese, soft, nontender, +distended. No hepatosplenomegaly. No bruits or masses. Good bowel sounds. Extremities: no  cyanosis, clubbing, rash.  1+ edema at ankles.  Neuro: alert & orientedx3, cranial nerves grossly intact. Moves all 4 extremities w/o difficulty. Affect pleasa  ASSESSMENT & PLAN:  1. Chronic diastolic heart failure:  EF 88-87% grade II diastolic dysfx (echo 05/7970). NYHA II symptoms currently.  Volume much improved.  - Continue torsemide 100 mg twice a day and metolazone 2.5 mg every Mon and Fri.  - Currently stable, but concerned about her risk of decompensation in the future given poor renal function.  2. HTN: Increase Coreg to 18.75 mg bid.  3. OSA: Continue to wear CPAP nightly.  4. CKD, stage IV: Followed by Dr Marval Regal. Check BMET today.  I am concerned that she will need dialysis in the future.   5. CAD: Admitted in 5/15 with elevated troponin but cath not done.  Cardiolite showed no ischemia.  She was started on Plavix for medical management of possible ACS. Continue ASA and statin.  She can stop Plavix at 1 year (5/16).  6. DM: Having difficulty managing glucose.  Followed by her PCP closely.   Loralie Champagne 05/15/2014

## 2014-05-15 NOTE — Patient Instructions (Addendum)
Increase Carvedilol to 18.75 mg Twice daily, you can take 3 of your 6.25 mg tablets Twice daily until you run out, THEN we have sent you in a new prescription for 12.5 mg tablets take 1 and 1/2 tabs Twice daily   You can stop Plavix at the end of May  Lab today  Your physician recommends that you schedule a follow-up appointment in: 1 month  Do the following things EVERYDAY: 1) Weigh yourself in the morning before breakfast. Write it down and keep it in a log. 2) Take your medicines as prescribed 3) Eat low salt foods-Limit salt (sodium) to 2000 mg per day.  4) Stay as active as you can everyday 5) Limit all fluids for the day to less than 2 liters

## 2014-05-20 ENCOUNTER — Telehealth (HOSPITAL_COMMUNITY): Payer: Self-pay

## 2014-05-20 MED ORDER — POTASSIUM CHLORIDE CRYS ER 20 MEQ PO TBCR: EXTENDED_RELEASE_TABLET | ORAL | Status: AC

## 2014-05-20 NOTE — Telephone Encounter (Signed)
Lab results reviewed with patient.  Serum K on 05/16/14 3.3, per Dr. Shirlee LatchMcLean advised to increase daily potassium dosage by an extra 20 meq.  Will make her new daily potassium regimen 60meq in am, 40 meq in PM except on Mondays and Fridays where she will take 80 meq in am and 60 meq in pm (days she takes metolazone).  Patient aware and agreeable, Rx updated to preferred pharmacy electronically.  Ave FilterBradley, [name]

## 2014-05-29 ENCOUNTER — Encounter: Payer: Self-pay | Admitting: Neurology

## 2014-05-29 ENCOUNTER — Ambulatory Visit (INDEPENDENT_AMBULATORY_CARE_PROVIDER_SITE_OTHER): Payer: 59 | Admitting: Neurology

## 2014-05-29 VITALS — BP 168/87 | HR 62 | Ht 67.0 in | Wt 286.0 lb

## 2014-05-29 DIAGNOSIS — E1342 Other specified diabetes mellitus with diabetic polyneuropathy: Secondary | ICD-10-CM

## 2014-05-29 NOTE — Progress Notes (Signed)
[facility]    Provider:  Dr Jaynee Eagles Referring Provider: Katherina Mires, MD Primary Care Physician:  Suzanna Obey, MD  CC:  neuropathy   HPI: Jasmine [name] is a 44 y.o. female here as a follow up for neuropathy   She is unsteady, she has fallen. She is afraid she is going to fall. It is hard for her to walk. She doesn't want to do anything because it is so painful even to walk to the bathroom. 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 She can't keep up, she can't do the job anymore. 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 All jobs have to walk or stand and she can't do any of that. Her co workers help her.    04/03/2014: She is having bad cramping at night. Muscles are seizing up at night. She is still having a lot of burning in the extremities. Burning is worse at night. Cymbalta is helping with the pain, they used to hurt all the time. She still has the burning in the toe area. It is all day long especially at work. No side effects from the Cymbalta.   Initial visit 10/03/2013: Jasmine [name] is a 44 y.o. female here as a referral from Dr. Darron Doom for pain and paresthesias. Patient has a PMHx of uncontrolled diabetes. She reports pain in the lower legs from the calfs to feet and ankles symmetrically. Feels like fire, needles, cramping, numbness. She can't feel her feet. No symptoms in the fingers. Denies LBP or radicular pain. Balance is off, difficult to walk. Balance is "really bad". Pain can get to 8-9/10. Worse at night in bed. Covers can't touch feet, can't shave legs cause it hurts to touch them. Symptoms started several years ago. She has bad cramps in feet and thighs like a charlie horse that won't go away. Sometimes a hot shower helps momentarily but really nothing makes it better. Pain is all day long, never remits. Neurontin made her dizzy, not sure if it was a high dose or not. Doesn't remember the dose and hesitant to try again. Has  had diabetes for 22 years, not well controlled has had DKA several times. Last A1c > 9. She has limited mobility due to the pain in her feet.   Reviewed notes, labs and imaging from outside physicians, which showed that patient has taken vicodin daily for neuropathy, she has bipolar disorder and panic attacks, HTN, DM2, diabetic gastroparesis. Glucose has been as high as 500 in the evenings when patient takes her glucose. Last hgba1c 9.1,     Review of Systems: Patient complains of symptoms per HPI as well as the following symptoms: Activity change, leg swelling, vomiting, insomnia, apnea, frequently waking, daytime sleepiness, sleep talking, environmental allergies, muscle cramps, walking difficulty, dizziness, nervous anxious. Pertinent negatives per HPI. All others negative.   History   Social History  . Marital Status: Single    Spouse Name: N/A  . Number of Children: 0  . Years of Education: BA   Occupational History  . [employer] [place] [employer]   Social History Main Topics  . Smoking status: Never Smoker   . Smokeless tobacco: Never Used  . Alcohol Use: 0.6 oz/week    1 Cans of beer per week     Comment: OCC  . Drug Use: No  . Sexual Activity: Yes    Birth Control/ Protection: IUD   Other Topics Concern  . Not on  file   Social History Narrative   Patient is single with no children.   Patient lives with boyfriend.   Patient is right handed.   Patient has Bachelor's degree.   Patient drinks 1 large cup daily.    Family History  Problem Relation Age of Onset  . Heart attack Mother   . Dementia Father     Past Medical History  Diagnosis Date  . Diastolic heart failure     a. 2D ECHO on 04/11/2014 w/ EF: 55- 60%, mild LVH. No RWMA, G2DD, mild AS, MIld MR, mild LAE  . Hypertensive heart disease   . Hyperlipidemia   . Anemia   . Morbid obesity   . Coronary artery disease      a. Cath 2010: non obstructive CAD (30% LAD proximal, mid and distal lesions,  60% diagonal, 30% Dominant mid circumflex) b. Lexiscan Myoview (05/2013) Fixed anteroapical scar of medium sixe, no reversible ischemia  . Carpal tunnel syndrome   . Abscess of anal and rectal regions   . Hypertension   . Aortic stenosis, mild   . Pneumonia     "once or twice" (05/02/2014)  . OSA on CPAP   . Diabetic gastroparesis   . Diabetes mellitus type 1 dx'd age 70  . History of blood transfusion     "related to my anemia"  . Diabetic nephropathy   . Chronic kidney disease (CKD), stage IV (severe)     Past Surgical History  Procedure Laterality Date  . Carpal tunnel release Bilateral 2003  . Incision and drainage abscess anal  2015  . Laparoscopic cholecystectomy  ~ 2010  . Cardiac catheterization  2010    Current Outpatient Prescriptions  Medication Sig Dispense Refill  . acetaminophen (TYLENOL) 500 MG tablet Take 500 mg by mouth every 6 (six) hours as needed for mild pain.    Marland Kitchen albuterol (PROVENTIL HFA;VENTOLIN HFA) 108 (90 BASE) MCG/ACT inhaler Inhale 2 puffs into the lungs every 6 (six) hours as needed for wheezing. 1 Inhaler 2  . amLODipine (NORVASC) 10 MG tablet Take 10 mg by mouth daily.    Marland Kitchen aspirin 81 MG tablet Take 81 mg by mouth daily. For pain    . atorvastatin (LIPITOR) 80 MG tablet Take 1 tablet (80 mg total) by mouth daily at 6 PM. 30 tablet 2  . Blood Glucose Monitoring Suppl (ONE TOUCH ULTRA 2) W/DEVICE KIT Check CBG's TID and prn    . calcitRIOL (ROCALTROL) 0.25 MCG capsule Take 1 capsule (0.25 mcg total) by mouth daily. 30 capsule 3  . carvedilol (COREG) 12.5 MG tablet Take 1.5 tablets (18.75 mg total) by mouth 2 (two) times daily with a meal. 90 tablet 3  . DULoxetine (CYMBALTA) 60 MG capsule Take 1 capsule (60 mg total) by mouth 2 (two) times daily. 60 capsule 6  . ferrous sulfate 325 (65 FE) MG tablet Take 325 mg by mouth 2 (two) times daily.      Marland Kitchen glucose blood test strip Check glucose TID and prn.    . insulin glargine (LANTUS) 100 UNIT/ML injection  Inject 60 Units into the skin at bedtime.     . insulin lispro (HUMALOG) 100 UNIT/ML injection Inject 15 Units into the skin 3 (three) times daily before meals.     . metoCLOPramide (REGLAN) 10 MG tablet Take 10 mg by mouth 3 (three) times daily before meals. One po qac    . metolazone (ZAROXOLYN) 2.5 MG tablet Take 1 tablet (2.5 mg total)  by mouth 2 (two) times a week. Every Mon and Fri 12 tablet 3  . potassium chloride SA (K-DUR,KLOR-CON) 20 MEQ tablet Take 60 meq (3 tabs) in am and 40 meq (2 tabs) in pm daily EXCEPT Mondays and Fridays take 80 meq (4 tabs) in am and 60 meq (3 tabs) in pm. 160 tablet 6  . Prenatal Vit-Fe Fumarate-FA (PRENATAL MULTIVITAMIN) TABS tablet Take 1 tablet by mouth daily at 12 noon.    . torsemide (DEMADEX) 20 MG tablet Take 5 tablets (100 mg total) by mouth 2 (two) times daily. 300 tablet 3  . torsemide (DEMADEX) 20 MG tablet Take 5 tablets (100 mg total) by mouth 2 (two) times daily. 300 tablet 3   No current facility-administered medications for this visit.    Allergies as of 05/29/2014 - Review Complete 05/29/2014  Allergen Reaction Noted  . Contrast media [iodinated diagnostic agents] Nausea And Vomiting 11/09/2010  . Paroxetine Nausea And Vomiting   . Oxycodone Itching and Rash 11/20/2010    Vitals: BP 168/87 mmHg  Pulse 62  Ht _0  (1.702 m)  Wt 286 lb (129.729 kg)  BMI 44.78 kg/m2 Last Weight:  Wt Readings from Last 1 Encounters:  05/29/14 286 lb (129.729 kg)   Last Height:   Ht Readings from Last 1 Encounters:  05/29/14 _1  (1.702 m)    Sensation:  Decreased temp, pp in a glove+stocking Absent vibration at malleous bilat  Reflex Exam:  DTR's:  Absent at ankles, otherwise deep tendon reflexes in the upper and lower extremities are normal bilaterally.   Assessment/Plan: 44 year old female with uncontrolled diabetes who is here for severe progression of painful paresthesias in her feet which have significantly limited her mobility and  cause her much distress. Neuro exam significant for decreased sensation in a glove and stocking distribution as well as allodynia. 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  Scooter evaluation ordered.     Sarina Ill, MD  [facility] [address] [place], [place] [address]  Phone [phone] Fax [phone]  A total of 15 minutes was spent face-to-face with this patient. Over half this time was spent on counseling patient on the peripheral neuropathy diagnosis and different diagnostic and therapeutic options available.

## 2014-06-05 ENCOUNTER — Telehealth: Payer: Self-pay | Admitting: Neurology

## 2014-06-05 NOTE — Telephone Encounter (Signed)
Patient called regarding disability paper work, she stated that she spoke with someone in our office regarding this last week but doesn't remember who it was. Please call and advise.

## 2014-06-05 NOTE — Telephone Encounter (Signed)
She spoke with Marylu LundJanet. I will not be filling out disability forms. Please give her janet's office number. thanks

## 2014-06-06 NOTE — Telephone Encounter (Signed)
Talked with Dewitt HoesJanet Bowen and she is going to call patient back and speak with her again.

## 2014-06-12 ENCOUNTER — Ambulatory Visit (HOSPITAL_COMMUNITY)
Admission: RE | Admit: 2014-06-12 | Discharge: 2014-06-12 | Disposition: A | Discharge status: Home / Self Care | Payer: 59 | Source: Ambulatory Visit | Attending: Cardiology | Admitting: Cardiology

## 2014-06-12 VITALS — BP 158/80 | HR 90 | Wt 281.2 lb

## 2014-06-12 DIAGNOSIS — Z794 Long term (current) use of insulin: Secondary | ICD-10-CM | POA: Diagnosis not present

## 2014-06-12 DIAGNOSIS — I10 Essential (primary) hypertension: Secondary | ICD-10-CM

## 2014-06-12 DIAGNOSIS — Z8249 Family history of ischemic heart disease and other diseases of the circulatory system: Secondary | ICD-10-CM | POA: Insufficient documentation

## 2014-06-12 DIAGNOSIS — I129 Hypertensive chronic kidney disease with stage 1 through stage 4 chronic kidney disease, or unspecified chronic kidney disease: Secondary | ICD-10-CM | POA: Insufficient documentation

## 2014-06-12 DIAGNOSIS — E785 Hyperlipidemia, unspecified: Secondary | ICD-10-CM | POA: Insufficient documentation

## 2014-06-12 DIAGNOSIS — Z7982 Long term (current) use of aspirin: Secondary | ICD-10-CM | POA: Diagnosis not present

## 2014-06-12 DIAGNOSIS — I5032 Chronic diastolic (congestive) heart failure: Secondary | ICD-10-CM | POA: Insufficient documentation

## 2014-06-12 DIAGNOSIS — Z79899 Other long term (current) drug therapy: Secondary | ICD-10-CM | POA: Diagnosis not present

## 2014-06-12 DIAGNOSIS — G4733 Obstructive sleep apnea (adult) (pediatric): Secondary | ICD-10-CM | POA: Insufficient documentation

## 2014-06-12 DIAGNOSIS — I252 Old myocardial infarction: Secondary | ICD-10-CM | POA: Diagnosis not present

## 2014-06-12 DIAGNOSIS — E1022 Type 1 diabetes mellitus with diabetic chronic kidney disease: Secondary | ICD-10-CM | POA: Diagnosis not present

## 2014-06-12 DIAGNOSIS — Z833 Family history of diabetes mellitus: Secondary | ICD-10-CM | POA: Diagnosis not present

## 2014-06-12 DIAGNOSIS — N184 Chronic kidney disease, stage 4 (severe): Secondary | ICD-10-CM | POA: Diagnosis not present

## 2014-06-12 DIAGNOSIS — I5033 Acute on chronic diastolic (congestive) heart failure: Secondary | ICD-10-CM | POA: Diagnosis not present

## 2014-06-12 DIAGNOSIS — I2510 Atherosclerotic heart disease of native coronary artery without angina pectoris: Secondary | ICD-10-CM | POA: Insufficient documentation

## 2014-06-12 LAB — BASIC METABOLIC PANEL
Anion gap: 7 (ref 5–15)
BUN: 45 mg/dL — AB (ref 6–20)
CHLORIDE: 103 mmol/L (ref 101–111)
CO2: 25 mmol/L (ref 22–32)
Calcium: 9 mg/dL (ref 8.9–10.3)
Creatinine, Ser: 2.32 mg/dL — ABNORMAL HIGH (ref 0.44–1.00)
GFR calc Af Amer: 28 mL/min — ABNORMAL LOW (ref >60)
GFR calc non Af Amer: 25 mL/min — ABNORMAL LOW (ref >60)
GLUCOSE: 201 mg/dL — AB (ref 65–99)
POTASSIUM: 3.7 mmol/L (ref 3.5–5.1)
Sodium: 135 mmol/L (ref 135–145)

## 2014-06-12 MED ORDER — METOLAZONE 2.5 MG PO TABS: 2.5000 mg | ORAL_TABLET | ORAL | Status: DC

## 2014-06-12 NOTE — Progress Notes (Signed)
Patient ID: Jasmine [name], female   DOB: Jun 16, 1970, 44 y.o.   MRN: [id number] PCP: Dr. Doreene Nest Nephrology: Dr Marval Regal Pulmonary: Dr. Elsworth Soho  HPI: [name] is a 44 y.o African American female with history of morbid obesity, nonobstructive CAD per cath 2010, HTN, OSA, uncontrolled DM type 1, stage IV CKD and diastolic HF.    Admitted 05/25/13-05/30/13 for L shoulder pain, diabetic ketoacidosis and NSTEMI. Had nuclear test showing fixed anteroapical defect and no ischemia. VQ scan was negative. Started on Plavix for medical management of ?ACS.   Admitted 3/16 through 04/15/14 with volume overload. Diuresed with IV lasix and transitioned back to torsemide 80 mg twice a day. Discharge weight was 289 pounds.   She was admitted again in 4/16 with intractable volume overload and worsening renal function despite getting IV Lasix in clinic.  She was seen by nephrology in the hospital and diuresed with high dose Lasix with loss of 31 lbs.   She returns today for followup. Weight is up about 6 lbs since last appointment. She is taking torsemide 100 mg bid and metolazone twice a week. She is short of breath walking up a flight of steps but has been ok on flat ground.  No orthopnea or PND.  No chest pain.     - V/Q scan on November 02, 2010, showing normal ventilation perfusion without evidence of PE.  - CT of the chest without contrast 10/2010 showing suspected multifocal alveolar edema in the bilateral upper lobes and superior left lower lobe. Multifocal infection considered less likely. Cardiomegaly with bilateral pleural effusions. No findings to suggest interstitial lung disease.  - 11/2010 CPX Peak VO2: 10.91ml/kg/min % predicted peak VO2: 57.2% (corrects to 19.6 for ideal body weight), VE/VCO2 slope: 52 (to peak exercise) 43 (to RCP), OUES: 1.08, Peak RER: 1.19, Ventilatory Threshold: 7.2 % predicted peak VO2: 37.6%, O2pulse: 12 % predicted O2pulse: 90% - Sleep study completed 11/25/2010. Dr Elsworth Soho evaluated  severe sleep apnea, placed on CPAP.  ECHO (09/2012): EF 55-60% with grade II diastolic dysfx ECHO (08/319): EF 55-60% with grade II DD  Labs     10/25/12: K+ 4.8, Cr 3.06 (ramipril stopped)     11/01/12: K+ 5.0  Cr 1.8 (Baseline 2.3)     08/08/13 K 4.1 Creatinine 1.8       04/22/2014: K 3.8 Creatinine 2.32  BNP 244      4/16: K 3.4, creatinine 3 => 3.4, BUN 105  SH: Works FT at [employer]. Lives with boyfriend in [place]. Occassional ETOH. No tobacco abuse or drug abuse.   FH: Mother deceased: CAD, HTN, DM2, TIAs        Father living: Alzheimers, HIV   ROS: All systems negative except as listed in HPI, PMH and Problem List.  Past Medical History  Diagnosis Date  . Diastolic heart failure     a. 2D ECHO on 04/11/2014 w/ EF: 55- 60%, mild LVH. No RWMA, G2DD, mild AS, MIld MR, mild LAE  . Hypertensive heart disease   . Hyperlipidemia   . Anemia   . Morbid obesity   . Coronary artery disease      a. Cath 2010: non obstructive CAD (30% LAD proximal, mid and distal lesions, 60% diagonal, 30% Dominant mid circumflex) b. Lexiscan Myoview (05/2013) Fixed anteroapical scar of medium sixe, no reversible ischemia  . Carpal tunnel syndrome   . Abscess of anal and rectal regions   . Hypertension   . Aortic stenosis, mild   . Pneumonia     "  once or twice" (05/02/2014)  . OSA on CPAP   . Diabetic gastroparesis   . Diabetes mellitus type 1 dx'd age 65  . History of blood transfusion     "related to my anemia"  . Diabetic nephropathy   . Chronic kidney disease (CKD), stage IV (severe)     Current Outpatient Prescriptions  Medication Sig Dispense Refill  . acetaminophen (TYLENOL) 500 MG tablet Take 500 mg by mouth every 6 (six) hours as needed for mild pain.    Marland Kitchen albuterol (PROVENTIL HFA;VENTOLIN HFA) 108 (90 BASE) MCG/ACT inhaler Inhale 2 puffs into the lungs every 6 (six) hours as needed for wheezing. 1 Inhaler 2  . amLODipine (NORVASC) 10 MG tablet Take 10 mg by mouth daily.    Marland Kitchen aspirin 81 MG  tablet Take 81 mg by mouth daily. For pain    . atorvastatin (LIPITOR) 80 MG tablet Take 1 tablet (80 mg total) by mouth daily at 6 PM. 30 tablet 2  . Blood Glucose Monitoring Suppl (ONE TOUCH ULTRA 2) W/DEVICE KIT Check CBG's TID and prn    . calcitRIOL (ROCALTROL) 0.25 MCG capsule Take 1 capsule (0.25 mcg total) by mouth daily. 30 capsule 3  . carvedilol (COREG) 12.5 MG tablet Take 1.5 tablets (18.75 mg total) by mouth 2 (two) times daily with a meal. 90 tablet 3  . DULoxetine (CYMBALTA) 60 MG capsule Take 1 capsule (60 mg total) by mouth 2 (two) times daily. 60 capsule 6  . ferrous sulfate 325 (65 FE) MG tablet Take 325 mg by mouth 2 (two) times daily.      Marland Kitchen glucose blood test strip Check glucose TID and prn.    . insulin glargine (LANTUS) 100 UNIT/ML injection Inject 60 Units into the skin at bedtime.     . insulin lispro (HUMALOG) 100 UNIT/ML injection Inject 15 Units into the skin 3 (three) times daily before meals.     . metoCLOPramide (REGLAN) 10 MG tablet Take 10 mg by mouth 3 (three) times daily before meals. One po qac    . metolazone (ZAROXOLYN) 2.5 MG tablet Take 1 tablet (2.5 mg total) by mouth 3 (three) times a week. Every Mon, Wed and Fri 12 tablet 3  . potassium chloride SA (K-DUR,KLOR-CON) 20 MEQ tablet Take 60 meq (3 tabs) in am and 40 meq (2 tabs) in pm daily EXCEPT Mondays and Fridays take 80 meq (4 tabs) in am and 60 meq (3 tabs) in pm. 160 tablet 6  . Prenatal Vit-Fe Fumarate-FA (PRENATAL MULTIVITAMIN) TABS tablet Take 1 tablet by mouth daily at 12 noon.    . torsemide (DEMADEX) 20 MG tablet Take 5 tablets (100 mg total) by mouth 2 (two) times daily. 300 tablet 3   No current facility-administered medications for this encounter.     Filed Vitals:   06/12/14 1359  BP: 158/80  Pulse: 90  Weight: 281 lb 4 oz (127.574 kg)  SpO2: 97%    PHYSICAL EXAM: General:  Well appearing. No resp difficulty HEENT: normal Neck: Thick. JVP 8-9 cm.  Carotids 2+ bilaterally; no  bruits. No lymphadenopathy or thryomegaly appreciated. Cor: PMI normal. Regular rate & rhythm. No rubs, gallops.  1/6 early SEM RUSB.  Lungs: clear Abdomen: obese, soft, nontender, +distended. No hepatosplenomegaly. No bruits or masses. Good bowel sounds. Extremities: no cyanosis, clubbing, rash.  1+ edema 1/2 up lower legs bilaterally.   Neuro: alert & orientedx3, cranial nerves grossly intact. Moves all 4 extremities w/o difficulty. Affect pleasa  ASSESSMENT & PLAN:  1. Chronic diastolic heart failure:  EF 09-32% grade II diastolic dysfx (echo 06/7122). NYHA II symptoms currently.  She is now somewhat volume overloaded on exam with rising weight.  - Continue torsemide 100 mg twice a day  - Increase metolazone to 2.5 mg every Mon/Wed/Fri.   - I remain concerned about her risk of decompensation in the future given poor renal function.  - BMET today and in 2 wks.  2. HTN: Continue Coreg and amlodipine.   3. OSA: Continue to wear CPAP nightly.  4. CKD, stage IV: Followed by Dr Marval Regal. Check BMET today.  I am concerned that she will need dialysis in the future.   5. CAD: Admitted in 5/15 with elevated troponin but cath not done.  Cardiolite showed no ischemia.  She was started on Plavix for medical management of possible ACS. Continue ASA and statin.  She can stop Plavix at 1 year which will be the end of this month.   6. DM: Having difficulty managing glucose.  Followed by her PCP closely, has appt with endocrinology.   Loralie Champagne 06/12/2014

## 2014-06-12 NOTE — Patient Instructions (Signed)
Increase Metolazone to every Mon, Wed and Friday, HOWEVER PLEASE TAKE METOLAZONE ON Thursday AND Saturday THIS WEEK ONLY  Labs today  Labs in 2 weeks  Your physician recommends that you schedule a follow-up appointment in: 6 weeks

## 2014-06-17 NOTE — Telephone Encounter (Signed)
Please let patient know that I will have a letter prepared this week about her disability. I will mail it to her home.  I will not recommend disability. I have reviewed her job description, and there is no task that is limited by peripheral neuropathy. thanks

## 2014-06-18 ENCOUNTER — Encounter: Payer: Self-pay | Admitting: Neurology

## 2014-06-18 ENCOUNTER — Telehealth: Payer: Self-pay | Admitting: *Deleted

## 2014-06-18 NOTE — Telephone Encounter (Signed)
Left letter on your desk to be mailed thanks

## 2014-06-18 NOTE — Telephone Encounter (Signed)
Left detailed message to let patient know Dr. Lucia GaskinsAhern is preparing her letter about disability this week and will mail it to her home address. I told her to call back if she had any questions and gave [employer] phone number. Gave office hours: Monday-Thursday 8-5pm and Friday 8-12pm, and told her Dr. Lucia GaskinsAhern and I are not in the office on Fridays.

## 2014-06-19 ENCOUNTER — Encounter: Payer: Self-pay | Admitting: *Deleted

## 2014-06-19 ENCOUNTER — Telehealth (HOSPITAL_COMMUNITY): Payer: Self-pay | Admitting: *Deleted

## 2014-06-19 NOTE — Telephone Encounter (Signed)
appt scheduled with [facility] for 07/03/14 at 8:00

## 2014-06-19 NOTE — Patient Instructions (Signed)
Mailed letter on 06/19/14 from Dr. Lucia GaskinsAhern dated for 06/18/14.

## 2014-06-26 ENCOUNTER — Telehealth: Payer: Self-pay | Admitting: Neurology

## 2014-06-26 ENCOUNTER — Other Ambulatory Visit (HOSPITAL_COMMUNITY): Payer: Self-pay

## 2014-06-26 NOTE — Telephone Encounter (Signed)
Spoke with Gap IncCary from [facility] and scheduled a face to face evaluation for the pt for a mobility wheelchair on 07/01/14 at 2:30 pm. Georga HackingCary stated due to medicare guidelines, pt needs appt before submitting request to insurance company. She stated Dr. Lucia GaskinsAhern will have to include certain details in her OV note as well. Pt has appt at 1:00 at another office, but Georga HackingCary stated this should be okay. She is going to call pt and let her know about appt and check with Eunice BlaseDebbie to make sure she will have enough time. Cary told me she will call back if it needs to be rescheduled.

## 2014-06-26 NOTE — Telephone Encounter (Signed)
Archie Pattenonya with [facility] is calling regarding getting a face to face appointment scheduled for the patient. Please call and advise. Thank you

## 2014-06-27 ENCOUNTER — Telehealth: Payer: Self-pay | Admitting: Neurology

## 2014-06-27 NOTE — Telephone Encounter (Signed)
Archie Pattenonya with [facility] called and requested to speak with Marlis EdelsonEmma RN. She stated that the patient had not received her wheelchair yet and therefor she needed to cancel her appt and move it to a later time. Please call and advise.

## 2014-06-27 NOTE — Telephone Encounter (Signed)
Spoke with Archie Pattenonya from [facility] and she stated pt still needed PT evaluation for wheelchair and that appt is not until 08/21/14. She stated she will call back to schedule face to face after that. She already cancelled appt with Dorise Bullionanielle White from 07/01/14.

## 2014-07-01 ENCOUNTER — Telehealth: Payer: Self-pay | Admitting: *Deleted

## 2014-07-01 ENCOUNTER — Ambulatory Visit: Payer: Self-pay | Admitting: Neurology

## 2014-07-01 NOTE — Telephone Encounter (Signed)
Spoke with Drinda ButtsAnnette from [facility] and she was not sure about the reasoning pt cannot be approved for powerwheel chair until 2017. I asked to speak with Archie Pattenonya and she stated she is not sure who that was and I also asked to speak with Zenia Residesebbie Roach. She stated she was not sure who this was and to try and contact Debbie to find out more information.

## 2014-07-03 NOTE — Telephone Encounter (Signed)
Debbie called back and requested to speak with Marlis EdelsonEmma RN. Please call and advise [phone])

## 2014-07-03 NOTE — Telephone Encounter (Signed)
Spoke with Jasmine [name] and she said Dr sent order next door for pt eval to be done. As far as Jasmine [name] nichols, saying pt does not qualify works at [employer], Jasmine [name] was not sure so she had Tonya from her team call her to clarify. She said Jasmine [name] was not sure about that. Debbie stated appt with us was cancelled on 06/27/14 due to patient needed PT evaluation first which is scheduled for 07/22/14 and then they will call us back to get appt scheduled for face to face.   Tonya number-- [phone], press option 1

## 2014-07-03 NOTE — Telephone Encounter (Signed)
Left detailed VM for Zenia ResidesDebbie Roach to call back about pt and clarifying evaluation for power wheel chair. Told her some things needed to be clarified. Gave pt name and DOB. Gave her [facility] phone number and my name.

## 2014-07-03 NOTE — Telephone Encounter (Signed)
Spoke with Zenia Residesebbie Roach from [facility] and told her pt Patient is not elligible until 2017 under Medicare for any wheelchair equipment per [facility] per Arna MediciAngela Nichols. Told her we originally scheduled pt for a face to face with her but Archie Pattenonya from her company called back and cancelled due to needed appt with PT evaluation on 08/21/14 first before having face to face. I told her we needed some clarification since Dr. Lucia GaskinsAhern got a message stating patient is not eligible until 2017. She said she was going to look into her case and call me back today. [facility]. She verbalized understanding.

## 2014-07-22 ENCOUNTER — Other Ambulatory Visit: Payer: Self-pay

## 2014-07-23 ENCOUNTER — Encounter: Payer: Self-pay | Admitting: *Deleted

## 2014-07-23 NOTE — Progress Notes (Signed)
Signed order for wheelchair eval by pt or ot faxed back to [facility], attn: Zenia Residesebbie Roach, fax # [phone]/fim

## 2014-07-24 ENCOUNTER — Encounter (HOSPITAL_COMMUNITY): Payer: Self-pay

## 2014-08-20 ENCOUNTER — Other Ambulatory Visit (HOSPITAL_COMMUNITY): Payer: Self-pay | Admitting: Internal Medicine

## 2014-08-20 ENCOUNTER — Encounter (HOSPITAL_COMMUNITY): Payer: Self-pay

## 2014-08-20 ENCOUNTER — Ambulatory Visit (HOSPITAL_COMMUNITY)
Admission: RE | Admit: 2014-08-20 | Discharge: 2014-08-20 | Disposition: A | Discharge status: Home / Self Care | Payer: 59 | Source: Ambulatory Visit | Attending: Internal Medicine | Admitting: Internal Medicine

## 2014-08-20 VITALS — BP 141/79 | HR 87 | Resp 20 | Wt 287.8 lb

## 2014-08-20 DIAGNOSIS — Z823 Family history of stroke: Secondary | ICD-10-CM | POA: Insufficient documentation

## 2014-08-20 DIAGNOSIS — K3184 Gastroparesis: Secondary | ICD-10-CM | POA: Insufficient documentation

## 2014-08-20 DIAGNOSIS — I2510 Atherosclerotic heart disease of native coronary artery without angina pectoris: Secondary | ICD-10-CM | POA: Diagnosis not present

## 2014-08-20 DIAGNOSIS — Z7982 Long term (current) use of aspirin: Secondary | ICD-10-CM | POA: Diagnosis not present

## 2014-08-20 DIAGNOSIS — N184 Chronic kidney disease, stage 4 (severe): Secondary | ICD-10-CM | POA: Insufficient documentation

## 2014-08-20 DIAGNOSIS — I129 Hypertensive chronic kidney disease with stage 1 through stage 4 chronic kidney disease, or unspecified chronic kidney disease: Secondary | ICD-10-CM | POA: Insufficient documentation

## 2014-08-20 DIAGNOSIS — E785 Hyperlipidemia, unspecified: Secondary | ICD-10-CM | POA: Insufficient documentation

## 2014-08-20 DIAGNOSIS — E1043 Type 1 diabetes mellitus with diabetic autonomic (poly)neuropathy: Secondary | ICD-10-CM | POA: Diagnosis not present

## 2014-08-20 DIAGNOSIS — Z833 Family history of diabetes mellitus: Secondary | ICD-10-CM | POA: Insufficient documentation

## 2014-08-20 DIAGNOSIS — I5033 Acute on chronic diastolic (congestive) heart failure: Secondary | ICD-10-CM

## 2014-08-20 DIAGNOSIS — I158 Other secondary hypertension: Secondary | ICD-10-CM | POA: Diagnosis not present

## 2014-08-20 DIAGNOSIS — I5032 Chronic diastolic (congestive) heart failure: Secondary | ICD-10-CM | POA: Diagnosis not present

## 2014-08-20 DIAGNOSIS — Z794 Long term (current) use of insulin: Secondary | ICD-10-CM | POA: Insufficient documentation

## 2014-08-20 DIAGNOSIS — G4733 Obstructive sleep apnea (adult) (pediatric): Secondary | ICD-10-CM | POA: Diagnosis not present

## 2014-08-20 DIAGNOSIS — E1022 Type 1 diabetes mellitus with diabetic chronic kidney disease: Secondary | ICD-10-CM | POA: Diagnosis not present

## 2014-08-20 DIAGNOSIS — Z8249 Family history of ischemic heart disease and other diseases of the circulatory system: Secondary | ICD-10-CM | POA: Insufficient documentation

## 2014-08-20 DIAGNOSIS — I350 Nonrheumatic aortic (valve) stenosis: Secondary | ICD-10-CM | POA: Insufficient documentation

## 2014-08-20 DIAGNOSIS — Z79899 Other long term (current) drug therapy: Secondary | ICD-10-CM | POA: Insufficient documentation

## 2014-08-20 LAB — BASIC METABOLIC PANEL
ANION GAP: 11 (ref 5–15)
BUN: 40 mg/dL — AB (ref 6–20)
CO2: 17 mmol/L — AB (ref 22–32)
CREATININE: 2.39 mg/dL — AB (ref 0.44–1.00)
Calcium: 8.9 mg/dL (ref 8.9–10.3)
Chloride: 101 mmol/L (ref 101–111)
GFR, EST AFRICAN AMERICAN: 27 mL/min — AB (ref >60)
GFR, EST NON AFRICAN AMERICAN: 24 mL/min — AB (ref >60)
Glucose, Bld: 567 mg/dL (ref 65–99)
Potassium: 4.7 mmol/L (ref 3.5–5.1)
Sodium: 129 mmol/L — ABNORMAL LOW (ref 135–145)

## 2014-08-20 LAB — BRAIN NATRIURETIC PEPTIDE: B Natriuretic Peptide: 163.3 pg/mL — ABNORMAL HIGH (ref 0.0–100.0)

## 2014-08-20 MED ORDER — METOLAZONE 2.5 MG PO TABS: 2.5000 mg | ORAL_TABLET | ORAL | Status: DC

## 2014-08-20 NOTE — Progress Notes (Signed)
Patient ID: Jasmine [name], female   DOB: [birth year], 44 y.o.   MRN: [id number]  PCP: Dr. Doreene Nest Nephrology: Dr Marval Regal Pulmonary: Dr. Elsworth Soho  HPI: Jasmine [name] is a 44 y.o African American female with history of morbid obesity, nonobstructive CAD per cath 2010, HTN, OSA, uncontrolled DM type 1, stage IV CKD and diastolic HF.    Admitted 05/25/13-05/30/13 for L shoulder pain, diabetic ketoacidosis and NSTEMI. Had nuclear test showing fixed anteroapical defect and no ischemia. VQ scan was negative. Started on Plavix for medical management of ?ACS.   Admitted 3/16 through 04/15/14 with volume overload. Diuresed with IV lasix and transitioned back to torsemide 80 mg twice a day. Discharge weight was 289 pounds.   She was admitted again in 4/16 with intractable volume overload and worsening renal function despite getting IV Lasix in clinic.  She was seen by nephrology in the hospital and diuresed with high dose Lasix with loss of 31 lbs.   She returns today for follow up.Last visit metolazone was increased to three times a week. Denies SOB/PND/Orthopnea. SOB with steps. Taking all medications. Ongoing lower extremity edema. Drinking extra fluid and eating salty foods.    - V/Q scan on November 02, 2010, showing normal ventilation perfusion without evidence of PE.  - CT of the chest without contrast 10/2010 showing suspected multifocal alveolar edema in the bilateral upper lobes and superior left lower lobe. Multifocal infection considered less likely. Cardiomegaly with bilateral pleural effusions. No findings to suggest interstitial lung disease.  - 11/2010 CPX Peak VO2: 10.61ml/kg/min % predicted peak VO2: 57.2% (corrects to 19.6 for ideal body weight), VE/VCO2 slope: 52 (to peak exercise) 43 (to RCP), OUES: 1.08, Peak RER: 1.19, Ventilatory Threshold: 7.2 % predicted peak VO2: 37.6%, O2pulse: 12 % predicted O2pulse: 90% - Sleep study completed 11/25/2010. Dr Elsworth Soho evaluated severe sleep apnea, placed on  CPAP.  ECHO (09/2012): EF 55-60% with grade II diastolic dysfx ECHO (08/9379): EF 55-60% with grade II DD ECHO (03/2014) EF 55-60% Grade II DDMild aortic stenosis  Labs     10/25/12: K+ 4.8, Cr 3.06 (ramipril stopped)     11/01/12: K+ 5.0  Cr 1.8 (Baseline 2.3)     08/08/13 K 4.1 Creatinine 1.8       04/22/2014: K 3.8 Creatinine 2.32  BNP 244      4/16: K 3.4, creatinine 3 => 3.4, BUN 105      06/12/2014: K 3.7 Creatinine 2.32   SH: Works FT at [employer]. Lives with boyfriend in [place]. Occassional ETOH. No tobacco abuse or drug abuse.   FH: Mother deceased: CAD, HTN, DM2, TIAs        Father living: Alzheimers, HIV   ROS: All systems negative except as listed in HPI, PMH and Problem List.  Past Medical History  Diagnosis Date  . Diastolic heart failure     a. 2D ECHO on 04/11/2014 w/ EF: 55- 60%, mild LVH. No RWMA, G2DD, mild AS, MIld MR, mild LAE  . Hypertensive heart disease   . Hyperlipidemia   . Anemia   . Morbid obesity   . Coronary artery disease      a. Cath 2010: non obstructive CAD (30% LAD proximal, mid and distal lesions, 60% diagonal, 30% Dominant mid circumflex) b. Lexiscan Myoview (05/2013) Fixed anteroapical scar of medium sixe, no reversible ischemia  . Carpal tunnel syndrome   . Abscess of anal and rectal regions   . Hypertension   . Aortic stenosis, mild   .  Pneumonia     "once or twice" (05/02/2014)  . OSA on CPAP   . Diabetic gastroparesis   . Diabetes mellitus type 1 dx'd age 44  . History of blood transfusion     "related to my anemia"  . Diabetic nephropathy   . Chronic kidney disease (CKD), stage IV (severe)     Current Outpatient Prescriptions  Medication Sig Dispense Refill  . acetaminophen (TYLENOL) 500 MG tablet Take 500 mg by mouth every 6 (six) hours as needed for mild pain.    Marland Kitchen albuterol (PROVENTIL HFA;VENTOLIN HFA) 108 (90 BASE) MCG/ACT inhaler Inhale 2 puffs into the lungs every 6 (six) hours as needed for wheezing. 1 Inhaler 2  . amLODipine  (NORVASC) 10 MG tablet Take 10 mg by mouth daily.    Marland Kitchen aspirin 81 MG tablet Take 81 mg by mouth daily. For pain    . atorvastatin (LIPITOR) 80 MG tablet Take 1 tablet (80 mg total) by mouth daily at 6 PM. 30 tablet 2  . Blood Glucose Monitoring Suppl (ONE TOUCH ULTRA 2) W/DEVICE KIT Check CBG's TID and prn    . calcitRIOL (ROCALTROL) 0.25 MCG capsule Take 1 capsule (0.25 mcg total) by mouth daily. 30 capsule 3  . carvedilol (COREG) 12.5 MG tablet Take 1.5 tablets (18.75 mg total) by mouth 2 (two) times daily with a meal. 90 tablet 3  . DULoxetine (CYMBALTA) 60 MG capsule Take 1 capsule (60 mg total) by mouth 2 (two) times daily. 60 capsule 6  . ferrous sulfate 325 (65 FE) MG tablet Take 325 mg by mouth 2 (two) times daily.      Marland Kitchen glucose blood test strip Check glucose TID and prn.    . insulin glargine (LANTUS) 100 UNIT/ML injection Inject 60 Units into the skin at bedtime.     . insulin lispro (HUMALOG) 100 UNIT/ML injection Inject 15 Units into the skin 3 (three) times daily before meals.     . metoCLOPramide (REGLAN) 10 MG tablet Take 10 mg by mouth 3 (three) times daily before meals. One po qac    . metolazone (ZAROXOLYN) 2.5 MG tablet Take 1 tablet (2.5 mg total) by mouth 3 (three) times a week. Every Mon, Wed and Fri 12 tablet 3  . potassium chloride SA (K-DUR,KLOR-CON) 20 MEQ tablet Take 60 meq (3 tabs) in am and 40 meq (2 tabs) in pm daily EXCEPT Mondays and Fridays take 80 meq (4 tabs) in am and 60 meq (3 tabs) in pm. 160 tablet 6  . Prenatal Vit-Fe Fumarate-FA (PRENATAL MULTIVITAMIN) TABS tablet Take 1 tablet by mouth daily at 12 noon.    . torsemide (DEMADEX) 20 MG tablet Take 5 tablets (100 mg total) by mouth 2 (two) times daily. 300 tablet 3   No current facility-administered medications for this encounter.     Filed Vitals:   08/20/14 1457  BP: 141/79  Pulse: 87  Resp: 20  Weight: 287 lb 12 oz (130.523 kg)  SpO2: 99%    PHYSICAL EXAM: General:  Well appearing. No resp  difficulty HEENT: normal Neck: Thick. JVP 8-9 cm.  Carotids 2+ bilaterally; no bruits. No lymphadenopathy or thryomegaly appreciated. Cor: PMI normal. Regular rate & rhythm. No rubs, gallops.  1/6 early SEM RUSB.  Lungs: clear Abdomen: obese, soft, nontender, +distended. No hepatosplenomegaly. No bruits or masses. Good bowel sounds. Extremities: no cyanosis, clubbing, rash. L>R  1+ edema .   Neuro: alert & orientedx3, cranial nerves grossly intact. Moves all 4 extremities  w/o difficulty. Affect pleasa  ASSESSMENT & PLAN:  1. Chronic diastolic heart failure:  EF 47-53% grade II diastolic dysfx (echo 03/9177). NYHA II symptoms currently.   - Continue torsemide 100 mg twice a day  - Continue metolazone to 2.5 mg every Mon/Wed/Fri.   Drinking extra fluid and I have asked him to cut back.  Reinforced low salt diet.  2. HTN: Continue Coreg and amlodipine.   3. OSA: Continue to wear CPAP nightly.  4. CKD, stage IV: Followed by Dr Marval Regal.  5. CAD: Admitted in 5/15 with elevated troponin but cath not done.  Cardiolite showed no ischemia.  She was started on Plavix for medical management of possible ACS. Continue ASA and statin.  Off plavix now. She completed for 1 year.   6. DM: Followed by her PCP closely. Needs follow up with endocrinology.   Follow up in 2 months   Jonika Critz NP-C 08/20/2014

## 2014-08-20 NOTE — Patient Instructions (Signed)
Routine lab work today. Will notify you of abnormal results, otherwise no news is good news!  Do the following things EVERYDAY: 1) Weigh yourself in the morning before breakfast. Write it down and keep it in a log. 2) Take your medicines as prescribed 3) Eat low salt foods-Limit salt (sodium) to 2000 mg per day.  4) Stay as active as you can everyday 5) Limit all fluids for the day to less than 2 liters  

## 2014-08-21 NOTE — Telephone Encounter (Signed)
bensimhon refill. Thank you for your time. 

## 2014-08-22 ENCOUNTER — Ambulatory Visit: Payer: 59 | Attending: Neurology | Admitting: Rehabilitative and Restorative Service Providers"

## 2014-08-22 ENCOUNTER — Telehealth (HOSPITAL_COMMUNITY): Payer: Self-pay | Admitting: Vascular Surgery

## 2014-08-22 ENCOUNTER — Ambulatory Visit: Payer: 59 | Admitting: Rehabilitative and Restorative Service Providers"

## 2014-08-22 DIAGNOSIS — R531 Weakness: Secondary | ICD-10-CM | POA: Insufficient documentation

## 2014-08-22 DIAGNOSIS — R269 Unspecified abnormalities of gait and mobility: Secondary | ICD-10-CM | POA: Diagnosis present

## 2014-08-22 NOTE — Telephone Encounter (Signed)
Pt returned a call to the nurse

## 2014-08-22 NOTE — Therapy (Signed)
[facility] [address] [place], [place], [address] Phone: [phone]   Fax:  [phone]  Patient Details  Name: Jasmine [name] MRN: [id number] Date of Birth: 1970/03/03 Referring Provider:  Melvenia Beam, MD  Encounter Date: 08/22/2014   Mobility/Seating Evaluation    PATIENT INFORMATION: Name: Jasmine [name] DOB: 04/27/1970  Sex: Female Date seen: 08/22/2014 Time: 0900  Address:  [place], [place] Physician: Dr. Sarina Ill, MD This evaluation/justification form will serve as the LMN for the following suppliers: __________________________ Supplier: [facility] Contact Person: Felton Clinton Phone:  ?????   Seating Therapist: Rudell Cobb, MPT Phone:   [phone]   Phone: [phone]    Spouse/Parent/Caregiver name: patient  Phone number: ????? Insurance/Payer: [place]      Reason for Referral: scooter  Patient/Caregiver Goals: "It hurts so much to walk, I can't do stuff."  Patient in constant pain from neuropathy  Patient was seen for face-to-face evaluation for new power wheelchair.  Also present was Felton Clinton to discuss recommendations and wheelchair options.  Further paperwork was completed and sent to vendor.  Patient appears to qualify for power mobility device at this time per objective findings.   MEDICAL HISTORY: Diagnosis: Primary Diagnosis: severe diabetic neuropathy Onset: 22 years ago Diagnosis: diastolic heart failure, Stage IV chronic kidney disease, obstructive sleep apnea    [x] Progressive Disease Relevant past and future surgeries: carpal tunnel release 2003, gall bladder removed   Height: 5'7" Weight: 285 Explain recent changes or trends in weight: fluctuates within 5 lbs, monitors due to heart failure   History including Falls: The patient has h/o uncontrolled diabetes with onset of neuropathy 8 years ago.  It has become more severe over the past 18 months with  constant pain that fluctuates in intensity.  The patient reports 4 falls in the past year (2 at work, 2 outdoors)    HOME ENVIRONMENT: [] House  [x] Condo/town home  [] Apartment  [] Assisted Living    [] Lives Alone [x]  Lives with Others                                                                                          Hours with caregiver: ?????  [] Home is accessible to patient           Stairs      [x] Yes []  No     Ramp [] Yes [x] No Comments:  second level condo  *Moving soon to a house one level with level entry for improved accessibility   COMMUNITY ADL: TRANSPORTATION: [x] Car    [] Van    [] Public Transportation    [] Adapted w/c Lift    [] Ambulance    [] Other:       [] Sits in wheelchair during transport  Employment/School: currently works for [employer] 20-28 hours/week, reporting falls at work, and worsened pain when on feet Specific requirements pertaining to mobility ?????  Other: Patient would need a heavy duty w/c due to weight and that would mean 200 lb wheelchair, which could make transporting limited.    FUNCTIONAL/SENSORY PROCESSING SKILLS:  Handedness:   [x] Right     []   Left    [] NA  Comments:  ?????  Functional Processing Skills for Wheeled Mobility [x] Processing Skills are adequate for safe wheelchair operation  Areas of concern than may interfere with safe operation of wheelchair Description of problem   []  Attention to environment      [] Judgment      []  Hearing  []  Vision or visual processing      [] Motor Planning  []  Fluctuations in Behavior  ?????    VERBAL COMMUNICATION: [x] WFL receptive []  WFL expressive [] Understandable  [] Difficult to understand  [] non-communicative []  Uses an augmented communication device  CURRENT SEATING / MOBILITY: Current Mobility Base:  [] None [] Dependent [] Manual [] Scooter [] Power  Type of Control: ?????  Manufacturer:  ?????Size:  ?????Age: ?????  Current Condition of Mobility Base:  n/a   Current Wheelchair components:  n/a   Describe posture in present seating system:  none      SENSATION and SKIN ISSUES: Sensation [] Intact  [x] Impaired [] Absent  Level of sensation: distally due to neuropathy in sock distribution below knee Pressure Relief: Able to perform effective pressure relief :    [x] Yes  []  No Method: ???? If not, Why?: ?????  Skin Issues/Skin Integrity Current Skin Issues  [] Yes [x] No [] Intact []  Red area[]  Open Area  [] Scar Tissue [] At risk from prolonged sitting Where  ?????  History of Skin Issues  [] Yes [x] No Where  ????? When  ?????  Hx of skin flap surgeries  [] Yes [x] No Where  ????? When  ?????  Limited sitting tolerance [] Yes [x] No Hours spent sitting in wheelchair daily: ?????  Complaint of Pain:  Please describe: pain in legs is 5/10 at rest, rates it 9/10 when walking short distances.  Pain in bilateral LEs and tingling in UEs with neuropathy present in hands too.   Swelling/Edema: L ankle swells more than R ankle.     ADL STATUS (in reference to wheelchair use):  Indep Assist Unable Indep with Equip Not assessed Comments  Dressing x ????? ????? ????? ????? ?????  Eating x ????? ????? ????? ????? ?????  Toileting x ????? ????? ????? ????? ?????  Bathing ????? x ????? ????? ????? fiance helps in/out of bath tub or shower  Grooming/Hygiene x ????? ????? ????? ????? ?????  Meal Prep x ????? ????? ????? ????? quick heat items  IADLS ????? x ????? ????? ????? fiance does all grocery shopping  Bowel Management: [x] Continent  [] Incontinent  [] Accidents Comments:  ?????  Bladder Management: [x] Continent  [] Incontinent  [] Accidents Comments:  ?????     WHEELCHAIR SKILLS: Manual w/c Propulsion: [] UE or LE strength and endurance sufficient to participate in ADLs using manual wheelchair Arm : [] left [] right   [] Both      Distance: ????? Foot:  [] left [] right   [] Both  Operate Scooter: []  Strength, hand grip, balance and transfer appropriate for use [] Living environment is accessible  for use of scooter  Operate Power w/c:  [x]  Std. Joystick   []  Alternative Controls Indep [x]  Assist []  Dependent/unable []  N/A []   [] Safe          []  Functional      Distance: ?????  Bed confined without wheelchair []  Yes [x]  No   STRENGTH/RANGE OF MOTION:  ????? Range of Motion Strength  Shoulder WFLs 4/5 shoulder flexion and abduction bilaterally  Elbow WFLs 5/5 elbow flexion and extension bilaterally  Wrist/Hand WFLs h/o carpal tunnel bilaterally recent onset of numbness/tingling bilateral hands from neuropathy  Hip Limitations due to soft tissue/obesity 2+/5 hip flexion bilaterally  Knee WFLs R 4/5 knee flexion/extension L 3/5 with pain with pressure due to LE edema and neuropathy  Ankle R ankle WFLs L ankle -15 degrees from neutral due to pain/swelling up to 38 degrees PF Significantly impaired due to pain and limited ROM with swelling     MOBILITY/BALANCE:  []  Patient is totally dependent for mobility  ?????    Balance Transfers Ambulation  Sitting Balance: Standing Balance: [x]  Independent []  Independent/Modified Independent  [x]  WFL     [x]  WFL []  Supervision [x]  Supervision  []  Uses UE for balance  []  Supervision []  Min Assist [x]  Ambulates with Assist  holding walls    []  Min Assist []  Min assist []  Mod Assist []  Ambulates with Device:      []  RW  []  StW  []  Cane  []  ?????  []  Mod Assist []  Mod assist []  Max assist   []  Max Assist []  Max assist []  Dependent []  Indep. Short Distance Only  []  Unable []  Unable []  Lift / Sling Required Distance (in feet)  rests every 50-60 ft with 8/10 pain   []  Sliding board []  Unable to Ambulate (see explanation below)  Cardio Status:  [] Intact  [x]  Impaired   []  NA     diastolic heart failure  Respiratory Status:  [] Intact   [x] Impaired   [] NA     shortness of breath  Orthotics/Prosthetics: n/a  Comments (Address manual vs power w/c vs scooter): Holds onto walls to ambulate in clinic and at home.  The patient stops every 50 feet due  to shortness of breath and pain in legs rated 8/10.  Berg balance score 21/56 indicating high fall risk and gait speed 1.16 ft/sec indicating high fall risk and limited household ambulator classification of gait.         Anterior / Posterior Obliquity Rotation-Pelvis ?????  PELVIS    [x]  []  []   Neutral Posterior Anterior  [x]  []  []   WFL Rt elev Lt elev  [x]  []  []   WFL Right Left                      Anterior    Anterior     []  Fixed []  Other []  Partly Flexible []  Flexible   []  Fixed []  Other []  Partly Flexible  []  Flexible  []  Fixed []  Other []  Partly Flexible  []  Flexible   TRUNK  [x]  []  []   WFL ? Thoracic ? Lumbar  Kyphosis Lordosis  [x]  []  []   WFL Convex Convex  Right Left [] c-curve [] s-curve [] multiple  [x]  Neutral []  Left-anterior []  Right-anterior     []  Fixed []  Flexible []  Partly Flexible []  Other  []  Fixed []  Flexible []  Partly Flexible []  Other  []  Fixed             []  Flexible []  Partly Flexible []  Other    Position Windswept  ?????  HIPS          [x]            []               []    Neutral       Abduct        ADduct         [x]           []            []   Neutral Right           Left      []   Fixed []  Subluxed []  Partly Flexible []  Dislocated []  Flexible  []  Fixed []  Other []  Partly Flexible  []  Flexible                 Foot Positioning Knee Positioning  ?????    [x]  WFL  [] Lt [] Rt [x]  WFL  [] Lt [] Rt    KNEES ROM concerns: ROM concerns:    & Dorsi-Flexed [] Lt [] Rt ?????    FEET Plantar Flexed [] Lt [] Rt      Inversion                 [] Lt [] Rt      Eversion                 [] Lt [] Rt     HEAD [x]  Functional []  Good Head Control  ?????  & []  Flexed         []  Extended []  Adequate Head Control    NECK []  Rotated  Lt  []  Lat Flexed Lt []  Rotated  Rt []  Lat Flexed Rt []  Limited Head Control     []  Cervical Hyperextension []  Absent  Head Control     SHOULDERS ELBOWS WRIST& HAND h/o carpal tunnel and neuropathyimpacting UE and hands      Left      Right    Left     Right    Left     Right   U/E [x] Functional           [x] Functional WFLs WFLs [] Fisting             [] Fisting      [] elev   [] dep      [] elev   [] dep       [] pro -[] retract     [] pro  [] retract [] subluxed             [] subluxed           Goals for Wheelchair Mobility  [x]  Independence with mobility in the home with motor related ADLs (MRADLs)  [x]  Independence with MRADLs in the community []  Provide dependent mobility  []  Provide recline     [] Provide tilt   Goals for Seating system []  Optimize pressure distribution [x]  Provide support needed to facilitate function or safety []  Provide corrective forces to assist with maintaining or improving posture []  Accommodate client's posture:   current seated postures and positions are not flexible or will not tolerate corrective forces []  Client to be independent with relieving pressure in the wheelchair [] Enhance physiological function such as breathing, swallowing, digestion  Simulation ideas/Equipment trials:patient can safely operate power mobility State why other equipment was unsuccessful:unable to propel manual w/c due to neuropathy, h/o carpal tunnel, and diastolic heart failure   MOBILITY BASE RECOMMENDATIONS and JUSTIFICATION: MOBILITY COMPONENT JUSTIFICATION  Manufacturer: JazzyModel: Elite heavy duty   Size: Width 20Seat Depth 18 [x] provide transport from point A to B      [x] promote Indep mobility  [x] is not a safe, functional ambulator [x] walker or cane inadequate [x] non-standard width/depth necessary to accommodate anatomical measurement []  ?????  [] Manual Mobility Base [] non-functional ambulator    [] Scooter/POV  [] can safely operate  [] can safely transfer   [] has adequate trunk stability  [] cannot functionally propel manual w/c  [x] Power Mobility Base  [] non-ambulatory  [x] cannot functionally propel manual wheelchair  []  cannot functionally and safely operate scooter/POV [x] can safely operate and  willing to  [] Stroller Base [] infant/child  [] unable to propel manual wheelchair [] allows for growth [] non-functional ambulator [] non-functional UE [] Indep  mobility is not a goal at this time  [] Tilt  [] Forward [] Backward [] Powered tilt  [] Manual tilt  [] change position against gravitational force on head and shoulders  [] change position for pressure relief/cannot weight shift [] transfers  [] management of tone [] rest periods [] control edema [] facilitate postural control  []  ?????  [] Recline  [] Power recline on power base [] Manual recline on manual base  [] accommodate femur to back angle  [] bring to full recline for ADL care  [] change position for pressure relief/cannot weight shift [] rest periods [] repositioning for transfers or clothing/diaper /catheter changes [] head positioning  [] Lighter weight required [] self- propulsion  [] lifting []  ?????  [x] Heavy Duty required [x] user weight greater than 250# [] extreme tone/ over active movement [] broken frame on previous chair []  ?????  [x]  Back  []  Angle Adjustable []  Custom molded captain's seat [] postural control [] control of tone/spasticity [] accommodation of range of motion [] UE functional control [] accommodation for seating system []  ????? [] provide lateral trunk support [] accommodate deformity [x] provide posterior trunk support [] provide lumbar/sacral support [] support trunk in midline [] Pressure relief over spinal processes  [x]  Seat Cushion Captian's seat [] impaired sensation  [] decubitus ulcers present [] history of pressure ulceration [] prevent pelvic extension [] low maintenance  [] stabilize pelvis  [] accommodate obliquity [] accommodate multiple deformity [] neutralize lower extremity position [] increase pressure distribution []  ?????  []  Pelvic/thigh support  []  Lateral thigh guide []  Distal medial pad  []  Distal lateral pad []  pelvis in neutral [] accommodate pelvis []  position upper legs []  alignment []   accommodate ROM []  decr adduction [] accommodate tone [] removable for transfers [] decr abduction  []  Lateral trunk Supports []  Lt     []  Rt [] decrease lateral trunk leaning [] control tone [] contour for increased contact [] safety  [] accommodate asymmetry []  ?????  []  Mounting hardware  [] lateral trunk supports  [] back   [] seat [] headrest      []  thigh support [] fixed   [] swing away [] attach seat platform/cushion to w/c frame [] attach back cushion to w/c frame [] mount postural supports [] mount headrest  [] swing medial thigh support away [] swing lateral supports away for transfers  []  ?????    Armrests  [] fixed [x] adjustable height [] removable   [] swing away  [x] flip back   [] reclining [x] full length pads [] desk    [] pads tubular  [x] provide support with elbow at 90   [] provide support for w/c tray [x] change of height/angles for variable activities [] remove for transfers [x] allow to come closer to table top [] remove for access to tables []  ?????  Hangers/ Leg rests  [] 60 [] 70 [] 90 [] elevating [] heavy duty  [] articulating [] fixed [] lift off [] swing away     [] power [] provide LE support  [] accommodate to hamstring tightness [] elevate legs during recline   [] provide change in position for Legs [] Maintain placement of feet on footplate [] durability [] enable transfers [] decrease edema [] Accommodate lower leg length []  ?????  Foot support Footplate    [] Lt  []  Rt  [x]  Center mount [x] flip up     [x] depth/angle adjustable [] Amputee adapter    []  Lt     []  Rt [x] provide foot support [x] accommodate to ankle ROM [x] transfers [] Provide support for residual extremity [x]  allow foot to go under wheelchair base []  decrease tone  []  ?????  []  Ankle strap/heel loops [] support foot on foot support [] decrease extraneous movement [] provide input to heel  [] protect foot  Tires: [] pneumatic  [x] flat free inserts  [] solid  [x] decrease maintenance  [x] prevent frequent flats [] increase  shock absorbency [] decrease pain from road shock [] decrease spasms from road shock []  ?????  [x]  Headrest  [x] provide posterior head support []   provide posterior neck support [] provide lateral head support [] provide anterior head support [] support during tilt and recline [] improve feeding   [] improve respiration [] placement of switches [] safety  [] accommodate ROM  [] accommodate tone [] improve visual orientation  []  Anterior chest strap []  Vest []  Shoulder retractors  [] decrease forward movement of shoulder [] accommodation of TLSO [] decrease forward movement of trunk [] decrease shoulder elevation [] added abdominal support [] alignment [] assistance with shoulder control  []  ?????  Pelvic Positioner [x] Belt [] SubASIS bar [] Dual Pull [] stabilize tone [x] decrease falling out of chair/ **will not Decr potential for sliding due to pelvic tilting [] prevent excessive rotation [] pad for protection over boney prominence [] prominence comfort [] special pull angle to control rotation []  ?????  Upper Extremity Support [] L   []  R [] Arm trough    [] hand support []  tray       [] full tray [] swivel mount [] decrease edema      [] decrease subluxation   [] control tone   [] placement for AAC/Computer/EADL [] decrease gravitational pull on shoulders [] provide midline positioning [] provide support to increase UE function [] provide hand support in natural position [] provide work surface   POWER WHEELCHAIR CONTROLS  [x] Proportional  [] Non-Proportional Type joystick [] Left  [x] Right [x] provides access for controlling wheelchair   [] lacks motor control to operate proportional drive control [] unable to understand proportional controls  Actuator Control Module  [] Single  [] Multiple   [] Allow the client to operate the power seat function(s) through the joystick control   [] Safety Reset Switches [] Used to change modes and stop the wheelchair when driving in latch mode    [] [employer]    [] programming for accurate control [] progressive Disease/changing condition [] non-proportional drive control needed [] Needed in order to operate power seat functions through joystick control   [] Display box [] Allows user to see in which mode and drive the wheelchair is set  [] necessary for alternate controls    [] Digital interface electronics [] Allows w/c to operate when using alternative drive controls  [] ASL Head Array [] Allows client to operate wheelchair  through switches placed in tri-panel headrest  [] Sip and puff with tubing kit [] needed to operate sip and puff drive controls  [] Upgraded tracking electronics [] increase safety when driving [] correct tracking when on uneven surfaces  [] Mount for switches or joystick [] Attaches switches to w/c  [] Swing away for access or transfers [] midline for optimal placement [] provides for consistent access  [] Attendant controlled joystick plus mount [] safety [] long distance driving [] operation of seat functions [] compliance with transportation regulations []  ?????    Rear wheel placement/Axle adjustability [] None [] semi adjustable [] fully adjustable  [] improved UE access to wheels [] improved stability [] changing angle in space for improvement of postural stability [] 1-arm drive access [] amputee pad placement []  ?????  Wheel rims/ hand rims  [] metal  [] plastic coated [] oblique projections [] vertical projections [] Provide ability to propel manual wheelchair  []  Increase self-propulsion with hand weakness/decreased grasp  Push handles [] extended  [] angle adjustable  [] standard [] caregiver access [] caregiver assist [] allows "hooking" to enable increased ability to perform ADLs or maintain balance  One armed device  [] Lt   [] Rt [] enable propulsion of manual wheelchair with one arm   []  ?????   Brake/wheel lock extension []  Lt   []  Rt [] increase indep in applying wheel locks   [] Side guards [] prevent clothing getting caught in wheel or becoming  soiled []  prevent skin tears/abrasions  Battery: NF 22 [x] to power wheelchair ?????  Other: ????? ????? ?????  The above equipment has a life- long use expectancy. Growth and changes in medical and/or functional conditions would be the exceptions.  This is to certify that the therapist has no financial relationship with durable medical provider or manufacturer. The therapist will not receive remuneration of any kind for the equipment recommended in this evaluation.   Patient has mobility limitation that significantly impairs safe, timely participation in one or more mobility related ADL's.  (bathing, toileting, feeding, dressing, grooming, moving from room to room)                                                             []  Yes []  No Will mobility device sufficiently improve ability to participate and/or be aided in participation of MRADL's?         []  Yes []  No Can limitation be compensated for with use of a cane or walker?                                                                                []  Yes []  No Does patient or caregiver demonstrate ability/potential ability & willingness to safely use the mobility device?   []  Yes []  No Does patient's home environment support use of recommended mobility device?                                                    []  Yes []  No Does patient have sufficient upper extremity function necessary to functionally propel a manual wheelchair?    []  Yes []  No Does patient have sufficient strength and trunk stability to safely operate a POV (scooter)?                                  []  Yes []  No Does patient need additional features/benefits provided by a power wheelchair for MRADL's in the home?       []  Yes []  No Does the patient demonstrate the ability to safely use a power wheelchair?                                                              []  Yes []  No  Therapist Name Printed: Rudell Cobb, MPT Date: 08/22/2014  Therapist's Signature:   Date:    Supplier's Name Printed: [employer] Date: 08/22/2014  Supplier's Signature:   Date:  Patient/Caregiver Signature:   Date:     This is to certify that I have read this evaluation and do agree with the content within:    Physician's Name Printed: Sarina Ill, MD  Physician's Signature:  Date:     This is to certify that I, the above signed therapist have the following affiliations: []   This DME provider []  Manufacturer of recommended equipment []  Patient's long term care facility [x]  None of the above           [facility] PT Assessment - 08/22/14 0932    Ambulation/Gait   Ambulation/Gait Yes   Ambulation Distance (Feet) 115 Feet   Gait Pattern Decreased stride length  lateral sway, decreased hip extension   Ambulation Surface Level   Gait velocity 1.16 ft/sec   Standardized Balance Assessment   Standardized Balance Assessment Berg Balance Test;Timed Up and Go Test   Berg Balance Test   Sit to Stand Able to stand  independently using hands   Standing Unsupported Able to stand 30 seconds unsupported   Sitting with Back Unsupported but Feet Supported on Floor or Stool Able to sit safely and securely 2 minutes   Stand to Sit Controls descent by using hands   Transfers Able to transfer safely, definite need of hands   Standing Unsupported with Eyes Closed Able to stand 3 seconds   Standing Ubsupported with Feet Together Needs help to attain position but able to stand for 30 seconds with feet together   From Standing, Reach Forward with Outstretched Arm Reaches forward but needs supervision   From Standing Position, Pick up Object from Floor Unable to try/needs assist to keep balance   From Standing Position, Turn to Look Behind Over each Shoulder Needs supervision when turning   Turn 360 Degrees Needs assistance while turning   Standing Unsupported, Alternately Place Feet on Step/Stool Needs assistance to keep from falling or unable to try   Standing Unsupported, One Foot in  Front Needs help to step but can hold 15 seconds   Standing on One Leg Unable to try or needs assist to prevent fall   Total Score 21       Juanluis Guastella 08/22/2014, 9:56 AM  [facility] [address] [place], [place], [address] Phone: [phone]   Fax:  [phone]

## 2014-08-22 NOTE — Telephone Encounter (Signed)
Patient returned call after getting message from office,  Labs results reviewed with patient Glucose 587, with instructions per Tonye Becket, NP pt should report to ER for further evalaution  Pt has not checked blood sugar today and is currently at work Again advised pt of glucose reading and orders from the NP  Pt aware and voiced understanding

## 2014-09-07 ENCOUNTER — Encounter (HOSPITAL_COMMUNITY): Payer: Self-pay | Admitting: Emergency Medicine

## 2014-09-07 ENCOUNTER — Inpatient Hospital Stay (HOSPITAL_COMMUNITY)
Admission: EM | Admit: 2014-09-07 | Discharge: 2014-09-08 | DRG: 683 | Disposition: A | Discharge status: Home / Self Care | Payer: 59 | Attending: Internal Medicine | Admitting: Internal Medicine

## 2014-09-07 ENCOUNTER — Emergency Department (HOSPITAL_COMMUNITY): Payer: 59

## 2014-09-07 DIAGNOSIS — E1110 Type 2 diabetes mellitus with ketoacidosis without coma: Secondary | ICD-10-CM | POA: Diagnosis present

## 2014-09-07 DIAGNOSIS — E1310 Other specified diabetes mellitus with ketoacidosis without coma: Secondary | ICD-10-CM | POA: Diagnosis not present

## 2014-09-07 DIAGNOSIS — G4733 Obstructive sleep apnea (adult) (pediatric): Secondary | ICD-10-CM | POA: Diagnosis present

## 2014-09-07 DIAGNOSIS — I252 Old myocardial infarction: Secondary | ICD-10-CM

## 2014-09-07 DIAGNOSIS — E871 Hypo-osmolality and hyponatremia: Secondary | ICD-10-CM | POA: Diagnosis present

## 2014-09-07 DIAGNOSIS — N189 Chronic kidney disease, unspecified: Secondary | ICD-10-CM | POA: Diagnosis present

## 2014-09-07 DIAGNOSIS — E1065 Type 1 diabetes mellitus with hyperglycemia: Secondary | ICD-10-CM | POA: Diagnosis present

## 2014-09-07 DIAGNOSIS — Z7982 Long term (current) use of aspirin: Secondary | ICD-10-CM | POA: Diagnosis not present

## 2014-09-07 DIAGNOSIS — I130 Hypertensive heart and chronic kidney disease with heart failure and stage 1 through stage 4 chronic kidney disease, or unspecified chronic kidney disease: Secondary | ICD-10-CM | POA: Diagnosis present

## 2014-09-07 DIAGNOSIS — K3184 Gastroparesis: Secondary | ICD-10-CM | POA: Diagnosis present

## 2014-09-07 DIAGNOSIS — E1043 Type 1 diabetes mellitus with diabetic autonomic (poly)neuropathy: Secondary | ICD-10-CM | POA: Diagnosis present

## 2014-09-07 DIAGNOSIS — I2510 Atherosclerotic heart disease of native coronary artery without angina pectoris: Secondary | ICD-10-CM | POA: Diagnosis present

## 2014-09-07 DIAGNOSIS — Z7951 Long term (current) use of inhaled steroids: Secondary | ICD-10-CM | POA: Diagnosis not present

## 2014-09-07 DIAGNOSIS — E1021 Type 1 diabetes mellitus with diabetic nephropathy: Secondary | ICD-10-CM | POA: Diagnosis present

## 2014-09-07 DIAGNOSIS — E876 Hypokalemia: Secondary | ICD-10-CM | POA: Diagnosis present

## 2014-09-07 DIAGNOSIS — E1022 Type 1 diabetes mellitus with diabetic chronic kidney disease: Secondary | ICD-10-CM | POA: Diagnosis present

## 2014-09-07 DIAGNOSIS — Z79899 Other long term (current) drug therapy: Secondary | ICD-10-CM

## 2014-09-07 DIAGNOSIS — E1165 Type 2 diabetes mellitus with hyperglycemia: Secondary | ICD-10-CM | POA: Diagnosis present

## 2014-09-07 DIAGNOSIS — N179 Acute kidney failure, unspecified: Principal | ICD-10-CM | POA: Diagnosis present

## 2014-09-07 DIAGNOSIS — Z6841 Body Mass Index (BMI) 40.0 and over, adult: Secondary | ICD-10-CM | POA: Diagnosis not present

## 2014-09-07 DIAGNOSIS — E13319 Other specified diabetes mellitus with unspecified diabetic retinopathy without macular edema: Secondary | ICD-10-CM | POA: Diagnosis present

## 2014-09-07 DIAGNOSIS — G629 Polyneuropathy, unspecified: Secondary | ICD-10-CM | POA: Diagnosis present

## 2014-09-07 DIAGNOSIS — E860 Dehydration: Secondary | ICD-10-CM | POA: Diagnosis present

## 2014-09-07 DIAGNOSIS — E785 Hyperlipidemia, unspecified: Secondary | ICD-10-CM | POA: Diagnosis present

## 2014-09-07 DIAGNOSIS — E10319 Type 1 diabetes mellitus with unspecified diabetic retinopathy without macular edema: Secondary | ICD-10-CM | POA: Diagnosis present

## 2014-09-07 DIAGNOSIS — N184 Chronic kidney disease, stage 4 (severe): Secondary | ICD-10-CM | POA: Diagnosis present

## 2014-09-07 DIAGNOSIS — I5022 Chronic systolic (congestive) heart failure: Secondary | ICD-10-CM | POA: Insufficient documentation

## 2014-09-07 DIAGNOSIS — R079 Chest pain, unspecified: Secondary | ICD-10-CM

## 2014-09-07 DIAGNOSIS — I350 Nonrheumatic aortic (valve) stenosis: Secondary | ICD-10-CM | POA: Diagnosis present

## 2014-09-07 DIAGNOSIS — Z9989 Dependence on other enabling machines and devices: Secondary | ICD-10-CM

## 2014-09-07 DIAGNOSIS — I5032 Chronic diastolic (congestive) heart failure: Secondary | ICD-10-CM | POA: Diagnosis present

## 2014-09-07 DIAGNOSIS — I10 Essential (primary) hypertension: Secondary | ICD-10-CM | POA: Diagnosis present

## 2014-09-07 LAB — MAGNESIUM
MAGNESIUM: 2.8 mg/dL — AB (ref 1.7–2.4)
Magnesium: 3.3 mg/dL — ABNORMAL HIGH (ref 1.7–2.4)

## 2014-09-07 LAB — COMPREHENSIVE METABOLIC PANEL
ALBUMIN: 3.6 g/dL (ref 3.5–5.0)
ALT: 12 U/L — ABNORMAL LOW (ref 14–54)
ANION GAP: 20 — AB (ref 5–15)
AST: 20 U/L (ref 15–41)
Alkaline Phosphatase: 145 U/L — ABNORMAL HIGH (ref 38–126)
BILIRUBIN TOTAL: 0.5 mg/dL (ref 0.3–1.2)
BUN: 111 mg/dL — AB (ref 6–20)
CHLORIDE: 76 mmol/L — AB (ref 101–111)
CO2: 28 mmol/L (ref 22–32)
Calcium: 9.1 mg/dL (ref 8.9–10.3)
Creatinine, Ser: 3.5 mg/dL — ABNORMAL HIGH (ref 0.44–1.00)
GFR, EST AFRICAN AMERICAN: 17 mL/min — AB (ref >60)
GFR, EST NON AFRICAN AMERICAN: 15 mL/min — AB (ref >60)
GLUCOSE: 466 mg/dL — AB (ref 65–99)
POTASSIUM: 2.6 mmol/L — AB (ref 3.5–5.1)
Sodium: 124 mmol/L — ABNORMAL LOW (ref 135–145)
TOTAL PROTEIN: 8.5 g/dL — AB (ref 6.5–8.1)

## 2014-09-07 LAB — RAPID URINE DRUG SCREEN, HOSP PERFORMED
Amphetamines: NOT DETECTED
BARBITURATES: NOT DETECTED
BENZODIAZEPINES: NOT DETECTED
Cocaine: NOT DETECTED
OPIATES: NOT DETECTED
TETRAHYDROCANNABINOL: NOT DETECTED

## 2014-09-07 LAB — CBC WITH DIFFERENTIAL/PLATELET
BASOS ABS: 0 10*3/uL (ref 0.0–0.1)
Basophils Relative: 0 % (ref 0–1)
EOS ABS: 0 10*3/uL (ref 0.0–0.7)
Eosinophils Relative: 0 % (ref 0–5)
HCT: 31.8 % — ABNORMAL LOW (ref 36.0–46.0)
Hemoglobin: 11.4 g/dL — ABNORMAL LOW (ref 12.0–15.0)
Lymphocytes Relative: 11 % — ABNORMAL LOW (ref 12–46)
Lymphs Abs: 1.8 10*3/uL (ref 0.7–4.0)
MCH: 27.8 pg (ref 26.0–34.0)
MCHC: 35.8 g/dL (ref 30.0–36.0)
MCV: 77.6 fL — ABNORMAL LOW (ref 78.0–100.0)
MONO ABS: 1.1 10*3/uL — AB (ref 0.1–1.0)
MONOS PCT: 7 % (ref 3–12)
Neutro Abs: 12.6 10*3/uL — ABNORMAL HIGH (ref 1.7–7.7)
Neutrophils Relative %: 82 % — ABNORMAL HIGH (ref 43–77)
PLATELETS: 410 10*3/uL — AB (ref 150–400)
RBC: 4.1 MIL/uL (ref 3.87–5.11)
RDW: 12.1 % (ref 11.5–15.5)
WBC: 15.5 10*3/uL — ABNORMAL HIGH (ref 4.0–10.5)

## 2014-09-07 LAB — I-STAT TROPONIN, ED: Troponin i, poc: 0.02 ng/mL (ref 0.00–0.08)

## 2014-09-07 LAB — BASIC METABOLIC PANEL
ANION GAP: 20 — AB (ref 5–15)
ANION GAP: 15 (ref 5–15)
ANION GAP: 14 (ref 5–15)
BUN: 103 mg/dL — ABNORMAL HIGH (ref 6–20)
BUN: 98 mg/dL — ABNORMAL HIGH (ref 6–20)
BUN: 94 mg/dL — AB (ref 6–20)
CALCIUM: 8.2 mg/dL — AB (ref 8.9–10.3)
CALCIUM: 8.4 mg/dL — AB (ref 8.9–10.3)
CALCIUM: 8.7 mg/dL — AB (ref 8.9–10.3)
CHLORIDE: 83 mmol/L — AB (ref 101–111)
CHLORIDE: 89 mmol/L — AB (ref 101–111)
CO2: 28 mmol/L (ref 22–32)
CO2: 28 mmol/L (ref 22–32)
CO2: 29 mmol/L (ref 22–32)
CREATININE: 2.81 mg/dL — AB (ref 0.44–1.00)
Chloride: 91 mmol/L — ABNORMAL LOW (ref 101–111)
Creatinine, Ser: 3.14 mg/dL — ABNORMAL HIGH (ref 0.44–1.00)
Creatinine, Ser: 2.7 mg/dL — ABNORMAL HIGH (ref 0.44–1.00)
GFR calc Af Amer: 23 mL/min — ABNORMAL LOW (ref >60)
GFR calc non Af Amer: 17 mL/min — ABNORMAL LOW (ref >60)
GFR, EST AFRICAN AMERICAN: 20 mL/min — AB (ref >60)
GFR, EST AFRICAN AMERICAN: 24 mL/min — AB (ref >60)
GFR, EST NON AFRICAN AMERICAN: 19 mL/min — AB (ref >60)
GFR, EST NON AFRICAN AMERICAN: 20 mL/min — AB (ref >60)
Glucose, Bld: 334 mg/dL — ABNORMAL HIGH (ref 65–99)
Glucose, Bld: 236 mg/dL — ABNORMAL HIGH (ref 65–99)
Glucose, Bld: 98 mg/dL (ref 65–99)
POTASSIUM: 2.8 mmol/L — AB (ref 3.5–5.1)
Potassium: 2.3 mmol/L — CL (ref 3.5–5.1)
Potassium: 2.5 mmol/L — CL (ref 3.5–5.1)
SODIUM: 132 mmol/L — AB (ref 135–145)
Sodium: 131 mmol/L — ABNORMAL LOW (ref 135–145)
Sodium: 134 mmol/L — ABNORMAL LOW (ref 135–145)

## 2014-09-07 LAB — URINALYSIS, ROUTINE W REFLEX MICROSCOPIC
Bilirubin Urine: NEGATIVE
Glucose, UA: 500 mg/dL — AB
Ketones, ur: NEGATIVE mg/dL
Nitrite: NEGATIVE
Protein, ur: >300 mg/dL — AB
Specific Gravity, Urine: 1.013 (ref 1.005–1.030)
Urobilinogen, UA: 0.2 mg/dL (ref 0.0–1.0)
pH: 6 (ref 5.0–8.0)

## 2014-09-07 LAB — CBG MONITORING, ED
GLUCOSE-CAPILLARY: 291 mg/dL — AB (ref 65–99)
Glucose-Capillary: 438 mg/dL — ABNORMAL HIGH (ref 65–99)
Glucose-Capillary: 416 mg/dL — ABNORMAL HIGH (ref 65–99)
Glucose-Capillary: 327 mg/dL — ABNORMAL HIGH (ref 65–99)
Glucose-Capillary: 232 mg/dL — ABNORMAL HIGH (ref 65–99)

## 2014-09-07 LAB — LIPASE, BLOOD: Lipase: 22 U/L (ref 22–51)

## 2014-09-07 LAB — MRSA PCR SCREENING: MRSA by PCR: POSITIVE — AB

## 2014-09-07 LAB — CORTISOL: Cortisol, Plasma: 3.1 ug/dL

## 2014-09-07 LAB — URINE MICROSCOPIC-ADD ON

## 2014-09-07 LAB — GLUCOSE, CAPILLARY
GLUCOSE-CAPILLARY: 207 mg/dL — AB (ref 65–99)
Glucose-Capillary: 183 mg/dL — ABNORMAL HIGH (ref 65–99)
Glucose-Capillary: 134 mg/dL — ABNORMAL HIGH (ref 65–99)

## 2014-09-07 MED ORDER — POTASSIUM CHLORIDE CRYS ER 20 MEQ PO TBCR
60.0000 meq | EXTENDED_RELEASE_TABLET | Freq: Two times a day (BID) | ORAL | Status: DC
  Administered 2014-09-07 – 2014-09-08 (×2): 60 meq via ORAL
  Filled 2014-09-07 (×2): qty 3

## 2014-09-07 MED ORDER — MUPIROCIN 2 % EX OINT
1.0000 "application " | TOPICAL_OINTMENT | Freq: Two times a day (BID) | CUTANEOUS | Status: DC
  Administered 2014-09-07 – 2014-09-08 (×2): 1 via NASAL
  Filled 2014-09-07: qty 22

## 2014-09-07 MED ORDER — DOCUSATE SODIUM 100 MG PO CAPS
100.0000 mg | ORAL_CAPSULE | Freq: Two times a day (BID) | ORAL | Status: DC
  Filled 2014-09-07: qty 1

## 2014-09-07 MED ORDER — POTASSIUM CHLORIDE 10 MEQ/100ML IV SOLN
10.0000 meq | INTRAVENOUS | Status: DC
  Filled 2014-09-07: qty 100

## 2014-09-07 MED ORDER — ALBUTEROL SULFATE (2.5 MG/3ML) 0.083% IN NEBU: 3.0000 mL | INHALATION_SOLUTION | Freq: Four times a day (QID) | RESPIRATORY_TRACT | Status: DC | PRN

## 2014-09-07 MED ORDER — DEXTROSE-NACL 5-0.45 % IV SOLN
INTRAVENOUS | Status: DC
  Administered 2014-09-07: 100 mL/h via INTRAVENOUS

## 2014-09-07 MED ORDER — POTASSIUM CHLORIDE CRYS ER 20 MEQ PO TBCR: 40.0000 meq | EXTENDED_RELEASE_TABLET | ORAL | Status: DC | PRN

## 2014-09-07 MED ORDER — SODIUM CHLORIDE 0.9 % IV SOLN
1000.0000 mL | Freq: Once | INTRAVENOUS | Status: AC
  Administered 2014-09-07: 1000 mL via INTRAVENOUS

## 2014-09-07 MED ORDER — DEXTROSE-NACL 5-0.45 % IV SOLN
INTRAVENOUS | Status: DC
  Administered 2014-09-08: 01:00:00 via INTRAVENOUS

## 2014-09-07 MED ORDER — SODIUM CHLORIDE 0.9 % IV SOLN
INTRAVENOUS | Status: DC
  Administered 2014-09-07: 3.6 [IU]/h via INTRAVENOUS
  Filled 2014-09-07: qty 2.5

## 2014-09-07 MED ORDER — CHLORHEXIDINE GLUCONATE CLOTH 2 % EX PADS
6.0000 | MEDICATED_PAD | Freq: Every day | CUTANEOUS | Status: DC
  Administered 2014-09-08: 6 via TOPICAL

## 2014-09-07 MED ORDER — AMLODIPINE BESYLATE 10 MG PO TABS
10.0000 mg | ORAL_TABLET | Freq: Every day | ORAL | Status: DC
  Administered 2014-09-08: 10 mg via ORAL
  Filled 2014-09-07: qty 1

## 2014-09-07 MED ORDER — DULOXETINE HCL 60 MG PO CPEP
60.0000 mg | ORAL_CAPSULE | Freq: Two times a day (BID) | ORAL | Status: DC
  Administered 2014-09-07 – 2014-09-08 (×2): 60 mg via ORAL
  Filled 2014-09-07 (×2): qty 1

## 2014-09-07 MED ORDER — PANTOPRAZOLE SODIUM 40 MG IV SOLR
40.0000 mg | Freq: Two times a day (BID) | INTRAVENOUS | Status: DC
  Administered 2014-09-07 – 2014-09-08 (×3): 40 mg via INTRAVENOUS
  Filled 2014-09-07 (×3): qty 40

## 2014-09-07 MED ORDER — METOCLOPRAMIDE HCL 5 MG PO TABS
5.0000 mg | ORAL_TABLET | Freq: Three times a day (TID) | ORAL | Status: DC
  Administered 2014-09-07 – 2014-09-08 (×2): 5 mg via ORAL
  Filled 2014-09-07 (×6): qty 1

## 2014-09-07 MED ORDER — CARVEDILOL 6.25 MG PO TABS
18.7500 mg | ORAL_TABLET | Freq: Two times a day (BID) | ORAL | Status: DC
  Administered 2014-09-07 – 2014-09-08 (×2): 18.75 mg via ORAL
  Filled 2014-09-07 (×4): qty 1

## 2014-09-07 MED ORDER — SODIUM CHLORIDE 0.9 % IV SOLN: 1000.0000 mL | INTRAVENOUS | Status: DC

## 2014-09-07 MED ORDER — SODIUM CHLORIDE 0.9 % IV SOLN: INTRAVENOUS | Status: DC

## 2014-09-07 MED ORDER — ONDANSETRON HCL 4 MG/2ML IJ SOLN
4.0000 mg | Freq: Once | INTRAMUSCULAR | Status: AC
  Administered 2014-09-07: 4 mg via INTRAVENOUS
  Filled 2014-09-07: qty 2

## 2014-09-07 MED ORDER — POTASSIUM CHLORIDE 10 MEQ/100ML IV SOLN
10.0000 meq | INTRAVENOUS | Status: AC
  Administered 2014-09-07 (×4): 10 meq via INTRAVENOUS
  Filled 2014-09-07 (×3): qty 100

## 2014-09-07 MED ORDER — SODIUM CHLORIDE 0.9 % IV BOLUS (SEPSIS)
1000.0000 mL | Freq: Once | INTRAVENOUS | Status: AC
  Administered 2014-09-07: 1000 mL via INTRAVENOUS

## 2014-09-07 MED ORDER — CALCITRIOL 0.25 MCG PO CAPS
0.2500 ug | ORAL_CAPSULE | Freq: Every day | ORAL | Status: DC
  Administered 2014-09-08: 0.25 ug via ORAL
  Filled 2014-09-07: qty 1

## 2014-09-07 MED ORDER — POTASSIUM CHLORIDE 10 MEQ/100ML IV SOLN
10.0000 meq | INTRAVENOUS | Status: AC
  Administered 2014-09-07 (×2): 10 meq via INTRAVENOUS
  Filled 2014-09-07 (×2): qty 100

## 2014-09-07 MED ORDER — SODIUM CHLORIDE 0.9 % IV SOLN
INTRAVENOUS | Status: DC
  Administered 2014-09-07: 20:00:00 via INTRAVENOUS
  Administered 2014-09-08: 6.3 [IU]/h via INTRAVENOUS
  Administered 2014-09-08 (×2): via INTRAVENOUS
  Filled 2014-09-07: qty 2.5

## 2014-09-07 MED ORDER — ATORVASTATIN CALCIUM 80 MG PO TABS
80.0000 mg | ORAL_TABLET | Freq: Every day | ORAL | Status: DC
  Administered 2014-09-07: 80 mg via ORAL
  Filled 2014-09-07: qty 1

## 2014-09-07 MED ORDER — ACETAMINOPHEN 500 MG PO TABS
500.0000 mg | ORAL_TABLET | Freq: Four times a day (QID) | ORAL | Status: DC | PRN
  Administered 2014-09-07: 500 mg via ORAL
  Filled 2014-09-07: qty 1

## 2014-09-07 MED ORDER — PRENATAL MULTIVITAMIN CH
1.0000 | ORAL_TABLET | Freq: Every day | ORAL | Status: DC
  Administered 2014-09-08: 1 via ORAL
  Filled 2014-09-07: qty 1

## 2014-09-07 NOTE — ED Notes (Signed)
Report given to charge nurse on 5W-- pt going to 5W 27

## 2014-09-07 NOTE — ED Notes (Signed)
Check bloodsugar438mg 

## 2014-09-07 NOTE — Progress Notes (Signed)
CRITICAL VALUE ALERT  Critical value received:  MRSA PCR +  Date of notification:  09/07/14  Time of notification:  2114  Critical value read back: yes  Nurse who received alert:  Betha Shadix, Joan Mayans  MD notified (1st page):  N/A, positive MRSA protocol ordered

## 2014-09-07 NOTE — Progress Notes (Signed)
Very low K.  Is on large amounts of oral potassium daily.  Will check random cortisol.  Algis Downs, PA-C [facility] Pager: [phone]

## 2014-09-07 NOTE — H&P (Signed)
[facility] History and Physical                                                                                    Jasmine [name], is a 44 y.o. female  MRN: [id number]   DOB - Jan 01, 1971  Admit Date - 09/07/2014  Outpatient Primary MD for the patient is Delbert Harness, MD  Referring Physician:  Betsy Coder, PA-C  Chief Complaint:   Chief Complaint  Patient presents with  . Hyperglycemia  . Chest Pain     HPI  Jasmine [name]  is a 44 y.o. female, with insulin-dependent diabetes, gastroparesis and peripheral neuropathy, chronic kidney disease, obesity presents to the ER today after vomiting thick black liquid. Mr. Cordner reports that due to her gastroparesis she vomits daily, but it is normally yellow fluid.  This morning she had 2 episodes of vomiting thick black liquid. She was frightened and googled it. After reading that it may be blood she came to the ER.  She reports that she has felt poorly since last Wednesday with increased fatigue, nausea, and vomiting. She does not know if her CBGs and been elevated as she lost her meter.  She does regularly take her insulin.  In the ER vital signs are stable, white count 15.5, sodium 124, potassium 2.6, chloride 76, creatinine 3.5, glucose 466, anion gap is 20, magnesium is elevated at 3.3. Hemoglobin is 11.4 which is higher than baseline, MCV value of 77.6 (the patient takes iron supplements).  Review of Systems   In addition to the HPI above,  No Fever-chills, No Headache, No changes with Vision or hearing, No problems swallowing food or Liquids, No Chest pain, Cough or Shortness of Breath, Bowel movements are slow and hard to pass No Blood in stool or Urine, No dysuria, No new skin rashes or bruises, No new joints pains-aches,  No new weakness, tingling, numbness in any extremity, No recent weight gain or loss, A full 10 point Review of Systems was done, except as stated above, all other Review of Systems were negative.  Past  Medical History  Past Medical History  Diagnosis Date  . Diastolic heart failure     a. 2D ECHO on 04/11/2014 w/ EF: 55- 60%, mild LVH. No RWMA, G2DD, mild AS, MIld MR, mild LAE  . Hypertensive heart disease   . Hyperlipidemia   . Anemia   . Morbid obesity   . Coronary artery disease      a. Cath 2010: non obstructive CAD (30% LAD proximal, mid and distal lesions, 60% diagonal, 30% Dominant mid circumflex) b. Lexiscan Myoview (05/2013) Fixed anteroapical scar of medium sixe, no reversible ischemia  . Carpal tunnel syndrome   . Abscess of anal and rectal regions   . Hypertension   . Aortic stenosis, mild   . Pneumonia     "once or twice" (05/02/2014)  . OSA on CPAP   . Diabetic gastroparesis   . Diabetes mellitus type 1 dx'd age 37  . History of blood transfusion     "related to my anemia"  . Diabetic nephropathy   . Chronic kidney disease (CKD), stage IV (severe)  Past Surgical History  Procedure Laterality Date  . Carpal tunnel release Bilateral 2003  . Incision and drainage abscess anal  2015  . Laparoscopic cholecystectomy  ~ 2010  . Cardiac catheterization  2010      Social History Social History  Substance Use Topics  . Smoking status: Never Smoker   . Smokeless tobacco: Never Used  . Alcohol Use: 0.6 oz/week    1 Cans of beer per week     Comment: OCC  Drinks alcohol rarely, 1 drink every 2 weeks.  Independent with ADLs. Has some difficulty walking but can ambulate  Family History Family History  Problem Relation Age of Onset  . Heart attack Mother   . Dementia Father     Prior to Admission medications   Medication Sig Start Date End Date Taking? Authorizing Provider  acetaminophen (TYLENOL) 500 MG tablet Take 500 mg by mouth every 6 (six) hours as needed for mild pain.   Yes Historical Provider, MD  albuterol (PROVENTIL HFA;VENTOLIN HFA) 108 (90 BASE) MCG/ACT inhaler Inhale 2 puffs into the lungs every 6 (six) hours as needed for wheezing. 10/25/12  Yes  Amy D Clegg, NP  amLODipine (NORVASC) 10 MG tablet Take 10 mg by mouth daily.   Yes Historical Provider, MD  aspirin 81 MG tablet Take 81 mg by mouth at bedtime. For pain   Yes Historical Provider, MD  atorvastatin (LIPITOR) 80 MG tablet Take 1 tablet (80 mg total) by mouth daily at 6 PM. 05/30/13  Yes Estela Isaiah Blakes, MD  calcitRIOL (ROCALTROL) 0.25 MCG capsule Take 1 capsule (0.25 mcg total) by mouth daily. 05/06/14  Yes Carly Arlyce Harman, MD  carvedilol (COREG) 12.5 MG tablet Take 1.5 tablets (18.75 mg total) by mouth 2 (two) times daily with a meal. 05/15/14  Yes Laurey Morale, MD  DULoxetine (CYMBALTA) 60 MG capsule Take 1 capsule (60 mg total) by mouth 2 (two) times daily. 04/30/14  Yes Anson Fret, MD  ferrous sulfate 325 (65 FE) MG tablet Take 325 mg by mouth 2 (two) times daily.     Yes Historical Provider, MD  insulin lispro (HUMALOG) 100 UNIT/ML injection Inject 15 Units into the skin 3 (three) times daily before meals.    Yes Historical Provider, MD  LANTUS SOLOSTAR 100 UNIT/ML Solostar Pen Inject 60 Units into the skin at bedtime. 09/03/14  Yes Historical Provider, MD  metoCLOPramide (REGLAN) 10 MG tablet Take 10 mg by mouth 3 (three) times daily before meals. One po qac 09/06/13  Yes Historical Provider, MD  metolazone (ZAROXOLYN) 2.5 MG tablet Take 1 tablet (2.5 mg total) by mouth 3 (three) times a week. Patient taking differently: Take 2.5 mg by mouth 3 (three) times a week. Takes on Monday, Wednesday, and Friday 08/22/14  Yes Dolores Patty, MD  potassium chloride SA (K-DUR,KLOR-CON) 20 MEQ tablet Take 60 meq (3 tabs) in am and 40 meq (2 tabs) in pm daily EXCEPT Mondays and Fridays take 80 meq (4 tabs) in am and 60 meq (3 tabs) in pm. Patient taking differently: Take 60 meq (3 tabs) in am and 40 meq (2 tabs) in pm daily EXCEPT Mondays, Wednesdays, and Fridays take 60 meq (3 tabs) in am and 60 meq (3 tabs) in pm. 05/20/14  Yes Laurey Morale, MD  Prenatal Vit-Fe Fumarate-FA  (PRENATAL MULTIVITAMIN) TABS tablet Take 1 tablet by mouth daily at 12 noon.   Yes Historical Provider, MD  torsemide (DEMADEX) 20 MG tablet Take 5  tablets (100 mg total) by mouth 2 (two) times daily. 04/30/14  Yes Laurey Morale, MD    Allergies  Allergen Reactions  . Contrast Media [Iodinated Diagnostic Agents] Nausea And Vomiting  . Paroxetine Nausea And Vomiting  . Oxycodone Itching and Rash    Physical Exam  Vitals  Blood pressure 176/72, pulse 80, temperature 98 F (36.7 C), temperature source Oral, resp. rate 12, height  (1.702 m), weight 127.007 kg (280 lb), SpO2 100 %.   General:  Obese, pleasant, stable-appearing female lying in bed in NAD  Psych:  Normal affect and insight, Not Suicidal or Homicidal, Awake Alert, Oriented X 3.  Neuro:   No acute deficits, left leg with weakness when compared to right, decreased sensation in the bottoms of her feet.  ENT:  Ears and Eyes appear Normal, Conjunctivae clear, PER. Dry MM  Neck:  Supple, No lymphadenopathy appreciated  Respiratory:  Symmetrical chest wall movement, Good air movement bilaterally, CTAB.  Cardiac:  RRR, No Murmurs, bilateral LEE L>R  Abdomen:  Positive bowel sounds, Soft, Non tender, Non distended,  No masses appreciated  Skin:  No Cyanosis, Normal Skin Turgor, No Skin Rash or Bruise.  Extremities:  Able to move all 4. 4/5 strength in LLE  Data Review  CBC  Recent Labs Lab 09/07/14 0830  WBC 15.5*  HGB 11.4*  HCT 31.8*  PLT 410*  MCV 77.6*  MCH 27.8  MCHC 35.8  RDW 12.1  LYMPHSABS 1.8  MONOABS 1.1*  EOSABS 0.0  BASOSABS 0.0    Chemistries   Recent Labs Lab 09/07/14 0828 09/07/14 0830  NA 124*  --   K 2.6*  --   CL 76*  --   CO2 28  --   GLUCOSE 466*  --   BUN 111*  --   CREATININE 3.50*  --   CALCIUM 9.1  --   MG  --  3.3*  AST 20  --   ALT 12*  --   ALKPHOS 145*  --   BILITOT 0.5  --     Urinalysis - pending  Imaging results:   Dg Chest 2 View  09/07/2014    CLINICAL DATA:  Chest pain and history of CHF.  EXAM: CHEST - 2 VIEW  COMPARISON:  05/02/2014  FINDINGS: The heart size and mediastinal contours are within normal limits. Previously noted mild interstitial edema/ CHF has resolved. There is no evidence of acute pulmonary edema, consolidation, pneumothorax, nodule or pleural fluid. Stable mild elevation of the anterior right hemidiaphragm. The visualized skeletal structures are unremarkable.  IMPRESSION: No active disease.   Electronically Signed   By: Irish Lack M.D.   On: 09/07/2014 09:21    My personal review of EKG: Sinus rhythm, regular rate, no QT prolongation.   Assessment & Plan  Principal Problem:   DKA (diabetic ketoacidoses) Active Problems:   Acute on chronic kidney failure   Retinopathy due to secondary diabetes mellitus   Gastroparesis   Morbid obesity   OSA on CPAP   Hypertension   Hyperglycemia due to type 2 diabetes mellitus   Hyponatremia   Hypokalemia   Hypermagnesemia   DKA Gap 20, fortunately bicarbonate is 28. Placed on glucose stabilizer.  Bmets every 4 hours We will progress to basal bolus regimen when sugars are controlled. Please ask case manager to assist with glucometer and test strips.  Black emesis Patient vomits regularly and is on iron supplementation. I'm not quite convinced that she vomited blood. Hemoglobin  appears stable. No reports of melanoma or black stools. Although BUN is up significantly. We'll place on IV Protonix. Recheck CBC in 8 hours. Gastroccult emesis, guaiac stool.  If she does have hematemesis we will consult gastroenterology.  Gastroparesis Continue Reglan. When she is able to tolerate a solid diet would advance slowly to low residue diet.  Hypokalemia Replete with IV runs of potassium. Follow bmet.  Hyponatremia Secondary to dehydration and DKA. IV fluids. Follow bmet.  Hypermagnesemia Likely due to acute on chronic renal failure and dehydration. Giving IV fluids.  Follow bmet. She does not appear to be on magnesium supplementation at home.  Acute on chronic renal failure  Baseline creatinine is 2.3.  Elevated on admission to 3.5 Holding diuretics including Demadex, Zaroxolyn. Receiving IV fluids.  Grade 2 diastolic dysfunction Currently hypovolemic. Holding diuretic medications Most recent echo was 04/11/2014. Preserved EF. Grade 2 diastolic dysfunction. No wall motion abnormalities, mild aortic stenosis, mild mitral regurg  Consultants Called:  None  Family Communication:   Patient alert and oriented and understands her plan of care  Code Status:  Full  Condition:  Guarded  Potential Disposition: To home in 48-72 hours or when gap closes and patient can tolerate a solid diet.  Time spent in minutes : [address],  PA-C on 09/07/2014 at 1:05 PM Between 7am to 7pm - Pager - [phone] After 7pm go to [email or web address] - password [email or web address] And look for the night coverage person covering me after hours  [facility]

## 2014-09-07 NOTE — ED Provider Notes (Signed)
CSN: [id number]     Arrival date & time 09/07/14  0802 History   First MD Initiated Contact with Patient 09/07/14 [id number]     Chief Complaint  Patient presents with  . Hyperglycemia  . Chest Pain    Jasmine [name] is a 44 y.o. female with a history of diastolic heart failure with an EF of 55-60%, hypertension, morbid obesity, coronary artery disease previous MI, diabetes and chronic kidney disease who presents to the emergency department complaining of chest pain, nausea, vomiting and generalized fatigue. The patient reports she has been feeling fatigued for the past 3 days. She reports that today she woke up with epigastric abdominal pain nausea and vomiting. She reports vomiting twice today. She reports vomiting "black chunks." She reports this abdominal pain that she has is chronic and she has no new abdominal pain. She does report 5 out of 10 substernal chest pain that is nonradiating. She describes it as a pressure. Patient reports she has not been checking her blood sugars recently as she lost her blood glucose meter. She reports she has still been giving herself insulin. Patient endorses feeling lightheaded upon standing today. Patient takes a baby aspirin daily. She denies other NSAID use.  She has a history of MI without stent placement. She is not on any anticoagulants. She reports having chronic left lower leg edema that has been ruled out for DVTs several times. Patient reports her last bowel movement was Wednesday. The patient denies fevers, chills, urinary symptoms, diarrhea, hematochezia, rashes, cough, wheezing, numbness, tingling, weakness, history of GI bleed, or new abdominal pain. Patient's previous abdominal surgery includes cholecystectomy.   (Consider location/radiation/quality/duration/timing/severity/associated sxs/prior Treatment) HPI  Past Medical History  Diagnosis Date  . Diastolic heart failure     a. 2D ECHO on 04/11/2014 w/ EF: 55- 60%, mild LVH. No RWMA, G2DD, mild AS,  MIld MR, mild LAE  . Hypertensive heart disease   . Hyperlipidemia   . Anemia   . Morbid obesity   . Coronary artery disease      a. Cath 2010: non obstructive CAD (30% LAD proximal, mid and distal lesions, 60% diagonal, 30% Dominant mid circumflex) b. Lexiscan Myoview (05/2013) Fixed anteroapical scar of medium sixe, no reversible ischemia  . Carpal tunnel syndrome   . Abscess of anal and rectal regions   . Hypertension   . Aortic stenosis, mild   . Pneumonia     "once or twice" (05/02/2014)  . OSA on CPAP   . Diabetic gastroparesis   . Diabetes mellitus type 1 dx'd age 55  . History of blood transfusion     "related to my anemia"  . Diabetic nephropathy   . Chronic kidney disease (CKD), stage IV (severe)    Past Surgical History  Procedure Laterality Date  . Carpal tunnel release Bilateral 2003  . Incision and drainage abscess anal  2015  . Laparoscopic cholecystectomy  ~ 2010  . Cardiac catheterization  2010   Family History  Problem Relation Age of Onset  . Heart attack Mother   . Dementia Father    Social History  Substance Use Topics  . Smoking status: Never Smoker   . Smokeless tobacco: Never Used  . Alcohol Use: 0.6 oz/week    1 Cans of beer per week     Comment: OCC   OB History    No data available     Review of Systems  Constitutional: Positive for fatigue. Negative for fever and chills.  HENT:  Negative for congestion and sore throat.   Eyes: Negative for visual disturbance.  Respiratory: Negative for cough, shortness of breath and wheezing.   Cardiovascular: Positive for chest pain and leg swelling (chronic ). Negative for palpitations.  Gastrointestinal: Positive for nausea, vomiting and abdominal pain (chronic ). Negative for diarrhea.  Genitourinary: Negative for dysuria, urgency, frequency and difficulty urinating.  Musculoskeletal: Negative for back pain and neck pain.  Skin: Negative for rash.  Neurological: Positive for light-headedness. Negative  for dizziness, syncope, weakness, numbness and headaches.      Allergies  Contrast media; Paroxetine; and Oxycodone  Home Medications   Prior to Admission medications   Medication Sig Start Date End Date Taking? Authorizing Provider  acetaminophen (TYLENOL) 500 MG tablet Take 500 mg by mouth every 6 (six) hours as needed for mild pain.   Yes Historical Provider, MD  albuterol (PROVENTIL HFA;VENTOLIN HFA) 108 (90 BASE) MCG/ACT inhaler Inhale 2 puffs into the lungs every 6 (six) hours as needed for wheezing. 10/25/12  Yes Amy D Clegg, NP  amLODipine (NORVASC) 10 MG tablet Take 10 mg by mouth daily.   Yes Historical Provider, MD  aspirin 81 MG tablet Take 81 mg by mouth at bedtime. For pain   Yes Historical Provider, MD  atorvastatin (LIPITOR) 80 MG tablet Take 1 tablet (80 mg total) by mouth daily at 6 PM. 05/30/13  Yes Estela Isaiah Blakes, MD  calcitRIOL (ROCALTROL) 0.25 MCG capsule Take 1 capsule (0.25 mcg total) by mouth daily. 05/06/14  Yes Carly Arlyce Harman, MD  carvedilol (COREG) 12.5 MG tablet Take 1.5 tablets (18.75 mg total) by mouth 2 (two) times daily with a meal. 05/15/14  Yes Laurey Morale, MD  DULoxetine (CYMBALTA) 60 MG capsule Take 1 capsule (60 mg total) by mouth 2 (two) times daily. 04/30/14  Yes Anson Fret, MD  ferrous sulfate 325 (65 FE) MG tablet Take 325 mg by mouth 2 (two) times daily.     Yes Historical Provider, MD  insulin lispro (HUMALOG) 100 UNIT/ML injection Inject 15 Units into the skin 3 (three) times daily before meals.    Yes Historical Provider, MD  LANTUS SOLOSTAR 100 UNIT/ML Solostar Pen Inject 60 Units into the skin at bedtime. 09/03/14  Yes Historical Provider, MD  metoCLOPramide (REGLAN) 10 MG tablet Take 10 mg by mouth 3 (three) times daily before meals. One po qac 09/06/13  Yes Historical Provider, MD  metolazone (ZAROXOLYN) 2.5 MG tablet Take 1 tablet (2.5 mg total) by mouth 3 (three) times a week. Patient taking differently: Take 2.5 mg by mouth 3  (three) times a week. Takes on Monday, Wednesday, and Friday 08/22/14  Yes Dolores Patty, MD  potassium chloride SA (K-DUR,KLOR-CON) 20 MEQ tablet Take 60 meq (3 tabs) in am and 40 meq (2 tabs) in pm daily EXCEPT Mondays and Fridays take 80 meq (4 tabs) in am and 60 meq (3 tabs) in pm. Patient taking differently: Take 60 meq (3 tabs) in am and 40 meq (2 tabs) in pm daily EXCEPT Mondays, Wednesdays, and Fridays take 60 meq (3 tabs) in am and 60 meq (3 tabs) in pm. 05/20/14  Yes Laurey Morale, MD  Prenatal Vit-Fe Fumarate-FA (PRENATAL MULTIVITAMIN) TABS tablet Take 1 tablet by mouth daily at 12 noon.   Yes Historical Provider, MD  torsemide (DEMADEX) 20 MG tablet Take 5 tablets (100 mg total) by mouth 2 (two) times daily. 04/30/14  Yes Laurey Morale, MD   BP 159/63 mmHg  Pulse 77  Temp(Src) 98 F (36.7 C) (Oral)  Resp 10  Ht 5\' 7"  (1.702 m)  Wt 280 lb (127.007 kg)  BMI 43.84 kg/m2  SpO2 98% Physical Exam  Constitutional: She is oriented to person, place, and time. She appears well-developed and well-nourished. No distress.  Obese female. Nontoxic appearing.  HENT:  Head: Normocephalic and atraumatic.  Mouth/Throat: Oropharynx is clear and moist.  Mucous membranes are slightly dry. No evidence of any blood in her mouth.  Eyes: Conjunctivae are normal. Pupils are equal, round, and reactive to light. Right eye exhibits no discharge. Left eye exhibits no discharge.  Neck: Neck supple. No JVD present.  Cardiovascular: Normal rate, regular rhythm, normal heart sounds and intact distal pulses.  Exam reveals no gallop and no friction rub.   No murmur heard. Bilateral radial and posterior tibialis pulses are intact.  Pulmonary/Chest: Effort normal and breath sounds normal. No respiratory distress. She has no wheezes. She has no rales. She exhibits tenderness.  Lungs are clear to auscultation bilaterally. Substernal chest is tender to palpation and reproduces her chest pain.  Abdominal: Soft.  Bowel sounds are normal. She exhibits no distension and no mass. There is tenderness. There is no rebound and no guarding.  Abdomen is soft. Bowel sounds are present. Patient has mild epigastric tenderness to palpation. No right upper quadrant tenderness to palpation. Negative Murphy's sign.  Musculoskeletal: She exhibits edema.  Patient has bilateral lower extremity edema with her left leg worse than her right. Patient has good strength in her bilateral upper and lower extremities.  Lymphadenopathy:    She has no cervical adenopathy.  Neurological: She is alert and oriented to person, place, and time. Coordination normal.  Skin: Skin is warm and dry. No rash noted. She is not diaphoretic. No erythema. No pallor.  Psychiatric: She has a normal mood and affect. Her behavior is normal.  Nursing note and vitals reviewed.   ED Course  Procedures (including critical care time) Labs Review Labs Reviewed  CBC WITH DIFFERENTIAL/PLATELET - Abnormal; Notable for the following:    WBC 15.5 (*)    Hemoglobin 11.4 (*)    HCT 31.8 (*)    MCV 77.6 (*)    Platelets 410 (*)    Neutrophils Relative % 82 (*)    Neutro Abs 12.6 (*)    Lymphocytes Relative 11 (*)    Monocytes Absolute 1.1 (*)    All other components within normal limits  COMPREHENSIVE METABOLIC PANEL - Abnormal; Notable for the following:    Sodium 124 (*)    Potassium 2.6 (*)    Chloride 76 (*)    Glucose, Bld 466 (*)    BUN 111 (*)    Creatinine, Ser 3.50 (*)    Total Protein 8.5 (*)    ALT 12 (*)    Alkaline Phosphatase 145 (*)    GFR calc non Af Amer 15 (*)    GFR calc Af Amer 17 (*)    Anion gap 20 (*)    All other components within normal limits  MAGNESIUM - Abnormal; Notable for the following:    Magnesium 3.3 (*)    All other components within normal limits  CBG MONITORING, ED - Abnormal; Notable for the following:    Glucose-Capillary 438 (*)    All other components within normal limits  CBG MONITORING, ED -  Abnormal; Notable for the following:    Glucose-Capillary 416 (*)    All other components within normal limits  CBG MONITORING, ED - Abnormal; Notable for the following:    Glucose-Capillary 327 (*)    All other components within normal limits  URINE CULTURE  LIPASE, BLOOD  URINALYSIS, ROUTINE W REFLEX MICROSCOPIC (NOT AT [facility])  URINE RAPID DRUG SCREEN, HOSP PERFORMED  BASIC METABOLIC PANEL  Rosezena Sensor, ED    Imaging Review Dg Chest 2 View  09/07/2014   CLINICAL DATA:  Chest pain and history of CHF.  EXAM: CHEST - 2 VIEW  COMPARISON:  05/02/2014  FINDINGS: The heart size and mediastinal contours are within normal limits. Previously noted mild interstitial edema/ CHF has resolved. There is no evidence of acute pulmonary edema, consolidation, pneumothorax, nodule or pleural fluid. Stable mild elevation of the anterior right hemidiaphragm. The visualized skeletal structures are unremarkable.  IMPRESSION: No active disease.   Electronically Signed   By: Irish Lack M.D.   On: 09/07/2014 09:21   I, Lawana Chambers, personally reviewed and evaluated these images and lab results as part of my medical decision-making.   EKG Interpretation   Date/Time:  Saturday September 07 2014 08:22:20 EDT Ventricular Rate:  75 PR Interval:  168 QRS Duration: 95 QT Interval:  434 QTC Calculation: 485 R Axis:   -4 Text Interpretation:  Sinus rhythm Nonspecific T abnormalities, lateral  leads No significant change since last tracing Confirmed by STEINL  MD,  Caryn Bee ([id number]) on 09/07/2014 8:30:16 AM      Filed Vitals:   09/07/14 1230 09/07/14 1238 09/07/14 1245 09/07/14 1300  BP: 176/72 176/72 177/68 159/63  Pulse: 78 80 78 77  Temp:      TempSrc:      Resp: Height:      Weight:      SpO2: 97% 100% 98% 98%     MDM   Meds given in ED:  Medications  dextrose 5 %-0.45 % sodium chloride infusion (not administered)  insulin regular (NOVOLIN R,HUMULIN R) 250 Units in  sodium chloride 0.9 % 250 mL (1 Units/mL) infusion (3.6 Units/hr Intravenous New Bag/Given 09/07/14 1059)  potassium chloride 10 mEq in 100 mL IVPB (not administered)  potassium chloride 10 mEq in 100 mL IVPB (10 mEq Intravenous New Bag/Given 09/07/14 1309)  pantoprazole (PROTONIX) injection 40 mg (not administered)  sodium chloride 0.9 % bolus 1,000 mL (0 mLs Intravenous Stopped 09/07/14 1045)  ondansetron (ZOFRAN) injection 4 mg (4 mg Intravenous Given 09/07/14 1001)  0.9 %  sodium chloride infusion (1,000 mLs Intravenous New Bag/Given 09/07/14 1308)    New Prescriptions   No medications on file    Final diagnoses:  Diabetic ketoacidosis without coma associated with other specified diabetes mellitus  AKI (acute kidney injury)  Chest pain, unspecified chest pain type   This  is a 44 y.o. female with a history of diastolic heart failure with an EF of 55-60%, hypertension, morbid obesity, coronary artery disease previous MI, diabetes and chronic kidney disease who presents to the emergency department complaining of chest pain, nausea, vomiting and generalized fatigue. The patient reports she has been feeling fatigued for the past 3 days. She reports that today she woke up with epigastric abdominal pain nausea and vomiting. She reports vomiting twice today. She reports vomiting "black chunks." She reports this abdominal pain that she has is chronic and she has no new abdominal pain. She does report 5 out of 10 substernal chest pain that is nonradiating. She describes it as a pressure. Patient reports she has not been checking her blood  sugars recently as she lost her blood glucose meter. She reports she has still been giving herself insulin. Patient endorses feeling lightheaded upon standing today.  On exam the patient's mucous membranes are slightly dry. There is no evidence of blood in the patient's mouth. Patient has mild epigastric tenderness to palpation. She also substernal chest tenderness to  palpation which reproduces the chest pain she describes. Patient is afebrile. She is nontoxic appearing. Patient's initial blood glucose is 438. CMP indicates a sodium of 124 with potassium of 2.6. She has a BUN of 111 and a creatinine of 3.50. She has an anion gap of 20. Her lipase is 22. Magnesium is elevated at 3.3- likely due to her kidney failure. Troponin is 0.02. Chest x-ray is unremarkable. EKG shows nonspecific T-wave abnormalities which is unchanged from her last tracing. Patient initiated on glucose stabilizer and IV potassium ordered. Will admit for patient's DKA and acute kidney injury.  I consulted for admission with Algis Downs, PA-C who is working with Dr. Randol Kern who accepted the patient for admission. Step down bed requested. The patient is in agreement with admission.  This patient was discussed with Dr. Denton Lank who agrees with assessment and plan.   CRITICAL CARE Performed by: Lawana Chambers   Total critical care time: 35  Critical care time was exclusive of separately billable procedures and treating other patients.  Critical care was necessary to treat or prevent imminent or life-threatening deterioration.  Critical care was time spent personally by me on the following activities: development of treatment plan with patient and/or surrogate as well as nursing, discussions with consultants, evaluation of patient's response to treatment, examination of patient, obtaining history from patient or surrogate, ordering and performing treatments and interventions, ordering and review of laboratory studies, ordering and review of radiographic studies, pulse oximetry and re-evaluation of patient's condition.    Everlene Farrier, PA-C 09/07/14 1322  Cathren Laine, MD 09/07/14 [phone]

## 2014-09-07 NOTE — ED Notes (Signed)
Weakness since Wednesday, vomiting "black liquid" this am, chest pain started on arrival to the ED. Pain free at present. Pt has not checked sugar in "awhwile" due to meter being lost-- needs to speak with social worker regarding obtaining a new meter.

## 2014-09-07 NOTE — Progress Notes (Signed)
CRITICAL VALUE ALERT  Critical value received:  K 2.5  Date of notification:  09/07/14  Time of notification:  1759  Critical value read back:Yes.    Nurse who received alert:  Stann Ore  MD notified (1st page):  Dr. Randol Kern  Time of first page:  [phone]  MD notified (2nd page):  Time of second page:  Responding MD:  Dr. Randol Kern  Time MD responded:  [phone]

## 2014-09-07 NOTE — Procedures (Signed)
Pt does not wish to wear cpap and will notify RT if anything changes. RT will continue to monitor.

## 2014-09-07 NOTE — ED Notes (Signed)
Paged admitting PA for RN.

## 2014-09-08 DIAGNOSIS — R079 Chest pain, unspecified: Secondary | ICD-10-CM

## 2014-09-08 DIAGNOSIS — E1310 Other specified diabetes mellitus with ketoacidosis without coma: Secondary | ICD-10-CM | POA: Insufficient documentation

## 2014-09-08 DIAGNOSIS — N179 Acute kidney failure, unspecified: Secondary | ICD-10-CM | POA: Insufficient documentation

## 2014-09-08 DIAGNOSIS — G4733 Obstructive sleep apnea (adult) (pediatric): Secondary | ICD-10-CM

## 2014-09-08 DIAGNOSIS — I5022 Chronic systolic (congestive) heart failure: Secondary | ICD-10-CM | POA: Insufficient documentation

## 2014-09-08 DIAGNOSIS — N189 Chronic kidney disease, unspecified: Secondary | ICD-10-CM

## 2014-09-08 LAB — BASIC METABOLIC PANEL
ANION GAP: 12 (ref 5–15)
Anion gap: 11 (ref 5–15)
Anion gap: 12 (ref 5–15)
Anion gap: 13 (ref 5–15)
BUN: 88 mg/dL — AB (ref 6–20)
BUN: 88 mg/dL — ABNORMAL HIGH (ref 6–20)
BUN: 91 mg/dL — ABNORMAL HIGH (ref 6–20)
BUN: 93 mg/dL — ABNORMAL HIGH (ref 6–20)
CALCIUM: 8.7 mg/dL — AB (ref 8.9–10.3)
CHLORIDE: 93 mmol/L — AB (ref 101–111)
CHLORIDE: 95 mmol/L — AB (ref 101–111)
CHLORIDE: 96 mmol/L — AB (ref 101–111)
CO2: 25 mmol/L (ref 22–32)
CO2: 27 mmol/L (ref 22–32)
CO2: 28 mmol/L (ref 22–32)
CO2: 28 mmol/L (ref 22–32)
CREATININE: 2.75 mg/dL — AB (ref 0.44–1.00)
CREATININE: 2.84 mg/dL — AB (ref 0.44–1.00)
CREATININE: 2.63 mg/dL — AB (ref 0.44–1.00)
Calcium: 8.6 mg/dL — ABNORMAL LOW (ref 8.9–10.3)
Calcium: 8.6 mg/dL — ABNORMAL LOW (ref 8.9–10.3)
Calcium: 8.8 mg/dL — ABNORMAL LOW (ref 8.9–10.3)
Chloride: 91 mmol/L — ABNORMAL LOW (ref 101–111)
Creatinine, Ser: 2.68 mg/dL — ABNORMAL HIGH (ref 0.44–1.00)
GFR calc Af Amer: 23 mL/min — ABNORMAL LOW (ref >60)
GFR calc Af Amer: 24 mL/min — ABNORMAL LOW (ref >60)
GFR calc non Af Amer: 19 mL/min — ABNORMAL LOW (ref >60)
GFR calc non Af Amer: 20 mL/min — ABNORMAL LOW (ref >60)
GFR, EST AFRICAN AMERICAN: 22 mL/min — AB (ref >60)
GFR, EST AFRICAN AMERICAN: 24 mL/min — AB (ref >60)
GFR, EST NON AFRICAN AMERICAN: 21 mL/min — AB (ref >60)
GFR, EST NON AFRICAN AMERICAN: 21 mL/min — AB (ref >60)
GLUCOSE: 189 mg/dL — AB (ref 65–99)
GLUCOSE: 139 mg/dL — AB (ref 65–99)
Glucose, Bld: 173 mg/dL — ABNORMAL HIGH (ref 65–99)
Glucose, Bld: 163 mg/dL — ABNORMAL HIGH (ref 65–99)
POTASSIUM: 3 mmol/L — AB (ref 3.5–5.1)
POTASSIUM: 4 mmol/L (ref 3.5–5.1)
Potassium: 3 mmol/L — ABNORMAL LOW (ref 3.5–5.1)
Potassium: 3.2 mmol/L — ABNORMAL LOW (ref 3.5–5.1)
SODIUM: 133 mmol/L — AB (ref 135–145)
Sodium: 131 mmol/L — ABNORMAL LOW (ref 135–145)
Sodium: 133 mmol/L — ABNORMAL LOW (ref 135–145)
Sodium: 134 mmol/L — ABNORMAL LOW (ref 135–145)

## 2014-09-08 LAB — GLUCOSE, CAPILLARY
GLUCOSE-CAPILLARY: 96 mg/dL (ref 65–99)
GLUCOSE-CAPILLARY: 154 mg/dL — AB (ref 65–99)
GLUCOSE-CAPILLARY: 177 mg/dL — AB (ref 65–99)
GLUCOSE-CAPILLARY: 164 mg/dL — AB (ref 65–99)
GLUCOSE-CAPILLARY: 194 mg/dL — AB (ref 65–99)
GLUCOSE-CAPILLARY: 185 mg/dL — AB (ref 65–99)
GLUCOSE-CAPILLARY: 169 mg/dL — AB (ref 65–99)
GLUCOSE-CAPILLARY: 144 mg/dL — AB (ref 65–99)
Glucose-Capillary: 151 mg/dL — ABNORMAL HIGH (ref 65–99)
Glucose-Capillary: 164 mg/dL — ABNORMAL HIGH (ref 65–99)
Glucose-Capillary: 176 mg/dL — ABNORMAL HIGH (ref 65–99)
Glucose-Capillary: 181 mg/dL — ABNORMAL HIGH (ref 65–99)
Glucose-Capillary: 136 mg/dL — ABNORMAL HIGH (ref 65–99)
Glucose-Capillary: 157 mg/dL — ABNORMAL HIGH (ref 65–99)
Glucose-Capillary: 160 mg/dL — ABNORMAL HIGH (ref 65–99)
Glucose-Capillary: 130 mg/dL — ABNORMAL HIGH (ref 65–99)

## 2014-09-08 LAB — CBC
HEMATOCRIT: 30.3 % — AB (ref 36.0–46.0)
Hemoglobin: 10.3 g/dL — ABNORMAL LOW (ref 12.0–15.0)
MCH: 27.8 pg (ref 26.0–34.0)
MCHC: 34 g/dL (ref 30.0–36.0)
MCV: 81.7 fL (ref 78.0–100.0)
Platelets: 351 10*3/uL (ref 150–400)
RBC: 3.71 MIL/uL — AB (ref 3.87–5.11)
RDW: 12.6 % (ref 11.5–15.5)
WBC: 11.3 10*3/uL — ABNORMAL HIGH (ref 4.0–10.5)

## 2014-09-08 LAB — TYPE AND SCREEN
ABO/RH(D): A POS
ANTIBODY SCREEN: NEGATIVE

## 2014-09-08 LAB — MAGNESIUM: MAGNESIUM: 3.2 mg/dL — AB (ref 1.7–2.4)

## 2014-09-08 MED ORDER — ASPIRIN 81 MG PO TABS: 81.0000 mg | ORAL_TABLET | Freq: Every day | ORAL | Status: AC

## 2014-09-08 MED ORDER — POTASSIUM CHLORIDE 10 MEQ/100ML IV SOLN
10.0000 meq | INTRAVENOUS | Status: AC
  Administered 2014-09-08 (×2): 10 meq via INTRAVENOUS
  Filled 2014-09-08 (×2): qty 100

## 2014-09-08 MED ORDER — PANTOPRAZOLE SODIUM 40 MG PO TBEC: 40.0000 mg | DELAYED_RELEASE_TABLET | Freq: Every day | ORAL | Status: AC

## 2014-09-08 MED ORDER — TORSEMIDE 20 MG PO TABS: 100.0000 mg | ORAL_TABLET | Freq: Two times a day (BID) | ORAL | Status: DC

## 2014-09-08 MED ORDER — INSULIN ASPART 100 UNIT/ML ~~LOC~~ SOLN: 0.0000 [IU] | Freq: Every day | SUBCUTANEOUS | Status: DC

## 2014-09-08 MED ORDER — LIVING WELL WITH DIABETES BOOK
Freq: Once | Status: DC
  Filled 2014-09-08: qty 1

## 2014-09-08 MED ORDER — INSULIN ASPART 100 UNIT/ML ~~LOC~~ SOLN
0.0000 [IU] | Freq: Three times a day (TID) | SUBCUTANEOUS | Status: DC
  Administered 2014-09-08: 2 [IU] via SUBCUTANEOUS

## 2014-09-08 MED ORDER — INSULIN GLARGINE 100 UNIT/ML ~~LOC~~ SOLN
60.0000 [IU] | Freq: Every day | SUBCUTANEOUS | Status: DC
  Administered 2014-09-08: 60 [IU] via SUBCUTANEOUS
  Filled 2014-09-08: qty 0.6

## 2014-09-08 NOTE — Discharge Summary (Signed)
Jasmine [name], is a 44 y.o. female  DOB Jasmine [name] 17, 1972  MRN [id number].  Admission date:  09/07/2014  Admitting Physician  Starleen Arms, MD  Discharge Date:  09/08/2014   Primary MD  Delbert Harness, MD  Recommendations for primary care physician for things to follow:   Check CBC, CMP and glycemic control next 2-3 days.  Patient GI follow-up.   Admission Diagnosis  AKI (acute kidney injury) [N17.9] Chest pain, unspecified chest pain type [R07.9] Diabetic ketoacidosis without coma associated with other specified diabetes mellitus [E13.10]   Discharge Diagnosis  AKI (acute kidney injury) [N17.9] Chest pain, unspecified chest pain type [R07.9] Diabetic ketoacidosis without coma associated with other specified diabetes mellitus [E13.10]     Principal Problem:   DKA (diabetic ketoacidoses) Active Problems:   Retinopathy due to secondary diabetes mellitus   Gastroparesis   Morbid obesity   OSA on CPAP   Hypertension   Hyperglycemia due to type 2 diabetes mellitus   Acute on chronic kidney failure   Hyponatremia   Hypokalemia   Hypermagnesemia   Diabetic ketoacidosis without coma associated with other specified diabetes mellitus      Past Medical History  Diagnosis Date  . Diastolic heart failure     a. 2D ECHO on 04/11/2014 w/ EF: 55- 60%, mild LVH. No RWMA, G2DD, mild AS, MIld MR, mild LAE  . Hypertensive heart disease   . Hyperlipidemia   . Anemia   . Morbid obesity   . Coronary artery disease      a. Cath 2010: non obstructive CAD (30% LAD proximal, mid and distal lesions, 60% diagonal, 30% Dominant mid circumflex) b. Lexiscan Myoview (05/2013) Fixed anteroapical scar of medium sixe, no reversible ischemia  . Carpal tunnel syndrome   . Abscess of anal and rectal regions   . Hypertension   . Aortic  stenosis, mild   . Pneumonia     "once or twice" (05/02/2014)  . OSA on CPAP   . Diabetic gastroparesis   . Diabetes mellitus type 1 dx'd age 55  . History of blood transfusion     "related to my anemia"  . Diabetic nephropathy   . Chronic kidney disease (CKD), stage IV (severe)     Past Surgical History  Procedure Laterality Date  . Carpal tunnel release Bilateral 2003  . Incision and drainage abscess anal  2015  . Laparoscopic cholecystectomy  ~ 2010  . Cardiac catheterization  2010       HPI  from the history and physical done on the day of admission:   Jasmine [name] is a 44 y.o. female, with insulin-dependent diabetes, gastroparesis and peripheral neuropathy, chronic kidney disease, obesity presents to the ER today after vomiting thick black liquid. Jasmine [name] reports that due to her gastroparesis she vomits daily, but it is normally yellow fluid. This morning she had 2 episodes of vomiting thick black liquid. She was frightened and googled it. After reading that it may be blood she came to the ER. She reports that  she has felt poorly since last Wednesday with increased fatigue, nausea, and vomiting. She does not know if her CBGs and been elevated as she lost her meter. She does regularly take her insulin.   In the ER vital signs are stable, white count 15.5, sodium 124, potassium 2.6, chloride 76, creatinine 3.5, glucose 466, anion gap is 20, magnesium is elevated at 3.3. Hemoglobin is 11.4 which is higher than baseline, MCV value of 77.6 (the patient takes iron supplements).      Hospital Course:     1. DKA in a patient with type 2 diabetes mellitus. Extremely poor compliance with insulin and diet, she was counseled extensively, treated with IV fluids and IV insulin per DKA protocol with good results. Has closed bicarbonate is normal will be discharged home on her home dose Lantus and sliding scale, counseled to be on a low carb diet and be compliant with her insulin. Request  PCP to monitor glycemic control closely.  2. Dark emesis upon admission. She says she vomits regularly and is on iron supplementation, she had no further episodes in the hospital and currently completely symptom free, will be placed on PPI twice a day, H&H stable when accounting for heme dilution with IV fluids, outpatient GI follow-up may require outpatient EGD.   3. Diabetic gastroparesis. Continue Reglan and small meals. Symptom-free now tolerating diet.   4. Acute renal failure on chronic in a disease stage IV. Baseline creatinine is 2.3, was hydrated and now close to baseline. Requested to skip diuretic for 1 more day.   5. Chronic grade 2 diastolic dysfunction - EF 55-60%, compensated no acute issues this admission.   6. Essential hypertension and dyslipidemia. Home medications resumed. Diuretic prescription for 1 more day.   7. Hypokalemia. Replaced request PCP to recheck in 2-3 days.     Discharge Condition: Stable  Follow UP  Follow-up Information    Follow up with Delbert Harness, MD. Schedule an appointment as soon as possible for a visit in 3 days.   Specialty:  Family Medicine   Contact information:   [address] [place] [address] [phone]       Follow up with Jasmine [name]., MD. Schedule an appointment as soon as possible for a visit in 1 week.   Specialty:  Gastroenterology   Why:  EGD   Contact information:   [address] [place] [address] [phone]        Consults obtained - None  Diet and Activity recommendation: See Discharge Instructions below  Discharge Instructions           Discharge Instructions    Discharge instructions    Complete by:  As directed   Follow with Primary MD Delbert Harness, MD in 3-4 days   Get CBC, CMP, 2 view Chest X ray checked  by Primary MD next visit.    Activity: As tolerated with Full fall precautions use walker/cane & assistance as needed   Disposition  Home     Diet: Low Carb diet with feeding assistance and aspiration precautions.  Accuchecks 4 times/day, Once in AM empty stomach and then before each meal. Log in all results and show them to your Prim.MD in 3 days. If any glucose reading is under 80 or above 300 call your Prim MD immidiately. Follow Low glucose instructions for glucose under 80 as instructed.    For Heart failure patients - Check your Weight same time everyday, if you gain  over 2 pounds, or you develop in leg swelling, experience more shortness of breath or chest pain, call your Primary MD immediately. Follow Cardiac Low Salt Diet and 1.5 lit/day fluid restriction.   On your next visit with your primary care physician please Get Medicines reviewed and adjusted.   Please request your Prim.MD to go over all Hospital Tests and Procedure/Radiological results at the follow up, please get all Hospital records sent to your Prim MD by signing hospital release before you go home.   If you experience worsening of your admission symptoms, develop shortness of breath, life threatening emergency, suicidal or homicidal thoughts you must seek medical attention immediately by calling 911 or calling your MD immediately  if symptoms less severe.  You Must read complete instructions/literature along with all the possible adverse reactions/side effects for all the Medicines you take and that have been prescribed to you. Take any new Medicines after you have completely understood and accpet all the possible adverse reactions/side effects.   Do not drive, operating heavy machinery, perform activities at heights, swimming or participation in water activities or provide baby sitting services if your were admitted for syncope or siezures until you have seen by Primary MD or a Neurologist and advised to do so again.  Do not drive when taking Pain medications.    Do not take more than prescribed Pain, Sleep and Anxiety Medications  Special  Instructions: If you have smoked or chewed Tobacco  in the last 2 yrs please stop smoking, stop any regular Alcohol  and or any Recreational drug use.  Wear Seat belts while driving.   Please note  You were cared for by a hospitalist during your hospital stay. If you have any questions about your discharge medications or the care you received while you were in the hospital after you are discharged, you can call the unit and asked to speak with the hospitalist on call if the hospitalist that took care of you is not available. Once you are discharged, your primary care physician will handle any further medical issues. Please note that NO REFILLS for any discharge medications will be authorized once you are discharged, as it is imperative that you return to your primary care physician (or establish a relationship with a primary care physician if you do not have one) for your aftercare needs so that they can reassess your need for medications and monitor your lab values.     Increase activity slowly    Complete by:  As directed              Discharge Medications       Medication List    TAKE these medications        acetaminophen 500 MG tablet  Commonly known as:  TYLENOL  Take 500 mg by mouth every 6 (six) hours as needed for mild pain.     albuterol 108 (90 BASE) MCG/ACT inhaler  Commonly known as:  PROVENTIL HFA;VENTOLIN HFA  Inhale 2 puffs into the lungs every 6 (six) hours as needed for wheezing.     amLODipine 10 MG tablet  Commonly known as:  NORVASC  Take 10 mg by mouth daily.     aspirin 81 MG tablet  Take 1 tablet (81 mg total) by mouth at bedtime. For pain  Start taking on:  09/11/2014     atorvastatin 80 MG tablet  Commonly known as:  LIPITOR  Take 1 tablet (80 mg total) by mouth daily at 6 PM.  calcitRIOL 0.25 MCG capsule  Commonly known as:  ROCALTROL  Take 1 capsule (0.25 mcg total) by mouth daily.     carvedilol 12.5 MG tablet  Commonly known as:  COREG    Take 1.5 tablets (18.75 mg total) by mouth 2 (two) times daily with a meal.     DULoxetine 60 MG capsule  Commonly known as:  CYMBALTA  Take 1 capsule (60 mg total) by mouth 2 (two) times daily.     ferrous sulfate 325 (65 FE) MG tablet  Take 325 mg by mouth 2 (two) times daily.     insulin lispro 100 UNIT/ML injection  Commonly known as:  HUMALOG  Inject 15 Units into the skin 3 (three) times daily before meals.     LANTUS SOLOSTAR 100 UNIT/ML Solostar Pen  Generic drug:  Insulin Glargine  Inject 60 Units into the skin at bedtime.     metoCLOPramide 10 MG tablet  Commonly known as:  REGLAN  Take 10 mg by mouth 3 (three) times daily before meals. One po qac     metolazone 2.5 MG tablet  Commonly known as:  ZAROXOLYN  Take 1 tablet (2.5 mg total) by mouth 3 (three) times a week.     pantoprazole 40 MG tablet  Commonly known as:  PROTONIX  Take 1 tablet (40 mg total) by mouth daily.     potassium chloride SA 20 MEQ tablet  Commonly known as:  K-DUR,KLOR-CON  Take 60 meq (3 tabs) in am and 40 meq (2 tabs) in pm daily EXCEPT Mondays and Fridays take 80 meq (4 tabs) in am and 60 meq (3 tabs) in pm.     prenatal multivitamin Tabs tablet  Take 1 tablet by mouth daily at 12 noon.     torsemide 20 MG tablet  Commonly known as:  DEMADEX  Take 5 tablets (100 mg total) by mouth 2 (two) times daily.  Start taking on:  09/09/2014        Major procedures and Radiology Reports - PLEASE review detailed and final reports for all details, in brief -       Dg Chest 2 View  09/07/2014   CLINICAL DATA:  Chest pain and history of CHF.  EXAM: CHEST - 2 VIEW  COMPARISON:  05/02/2014  FINDINGS: The heart size and mediastinal contours are within normal limits. Previously noted mild interstitial edema/ CHF has resolved. There is no evidence of acute pulmonary edema, consolidation, pneumothorax, nodule or pleural fluid. Stable mild elevation of the anterior right hemidiaphragm. The visualized  skeletal structures are unremarkable.  IMPRESSION: No active disease.   Electronically Signed   By: Irish Lack M.D.   On: 09/07/2014 09:21    Micro Results      Recent Results (from the past 240 hour(s))  MRSA PCR Screening     Status: Abnormal   Collection Time: 09/07/14  5:14 PM  Result Value Ref Range Status   MRSA by PCR POSITIVE (A) NEGATIVE Final    Comment:        The GeneXpert MRSA Assay (FDA approved for NASAL specimens only), is one component of a comprehensive MRSA colonization surveillance program. It is not intended to diagnose MRSA infection nor to guide or monitor treatment for MRSA infections. RESULT CALLED TO, READ BACK BY AND VERIFIED WITH: Bethena Roys RN [id number] AT 2110 SKEEN,P      Today   Subjective    Jasmine [name] today has no headache,no chest abdominal pain,no new weakness tingling  or numbness, feels much better wants to go home today.     Objective   Blood pressure 163/70, pulse 73, temperature 98.6 F (37 C), temperature source Oral, resp. rate 16, height 5\' 7"  (1.702 m), weight 127.007 kg (280 lb), SpO2 96 %.   Intake/Output Summary (Last 24 hours) at 09/08/14 1122 Last data filed at 09/08/14 0818  Gross per 24 hour  Intake 2116.67 ml  Output   2450 ml  Net -333.33 ml    Exam Awake Alert, Oriented x 3, No new F.N deficits, Normal affect [place].AT,PERRAL Supple Neck,No JVD, No cervical lymphadenopathy appriciated.  Symmetrical Chest wall movement, Good air movement bilaterally, CTAB RRR,No Gallops,Rubs or new Murmurs, No Parasternal Heave +ve B.Sounds, Abd Soft, Non tender, No organomegaly appriciated, No rebound -guarding or rigidity. No Cyanosis, Clubbing or edema, No new Rash or bruise   Data Review   CBC w Diff:  Lab Results  Component Value Date   WBC 11.3* 09/08/2014   HGB 10.3* 09/08/2014   HCT 30.3* 09/08/2014   HCT 24.8* 04/12/2014   PLT 351 09/08/2014   LYMPHOPCT 11* 09/07/2014   MONOPCT 7 09/07/2014   EOSPCT 0  09/07/2014   BASOPCT 0 09/07/2014    CMP:  Lab Results  Component Value Date   NA 134* 09/08/2014   NA 135 04/03/2014   K 3.0* 09/08/2014   CL 95* 09/08/2014   CO2 28 09/08/2014   BUN 88* 09/08/2014   BUN 63* 04/03/2014   CREATININE 2.63* 09/08/2014   PROT 8.5* 09/07/2014   PROT 6.8 04/03/2014   ALBUMIN 3.6 09/07/2014   BILITOT 0.5 09/07/2014   BILITOT 0.3 04/03/2014   ALKPHOS 145* 09/07/2014   AST 20 09/07/2014   ALT 12* 09/07/2014  .  Lab Results  Component Value Date   HGBA1C 9.8* 05/04/2014    CBG (last 3)   Recent Labs  09/08/14 0759 09/08/14 0908 09/08/14 1011  GLUCAP 136* 157* 144*     Total Time in preparing paper work, data evaluation and todays exam - 35 minutes  Leroy Sea M.D on 09/08/2014 at 11:22 AM  [facility]   Office  [phone]

## 2014-09-08 NOTE — Plan of Care (Signed)
     Jasmine [name] was admitted to the Hospital on 09/07/2014 and Discharged  09/08/2014 and should be excused from work/school   for 1 days starting 09/07/2014 , may return to work/school without any restrictions.  Call Susa Raring MD, [facility]  [phone] with questions.  Leroy Sea M.D on 09/08/2014,at 4:21 PM  [facility]   Office  [phone]

## 2014-09-08 NOTE — Progress Notes (Signed)
Pt refused to provide urine sample for pregnancy test. Stated, "I have an IUD."

## 2014-09-08 NOTE — Discharge Instructions (Signed)
Follow with Primary MD Jasmine Harness, MD in 3-4 days   Get CBC, CMP, 2 view Chest X ray checked  by Primary MD next visit.    Activity: As tolerated with Full fall precautions use walker/cane & assistance as needed   Disposition Home     Diet: Low Carb diet with feeding assistance and aspiration precautions.  Accuchecks 4 times/day, Once in AM empty stomach and then before each meal. Log in all results and show them to your Prim.MD in 3 days. If any glucose reading is under 80 or above 300 call your Prim MD immidiately. Follow Low glucose instructions for glucose under 80 as instructed.    For Heart failure patients - Check your Weight same time everyday, if you gain over 2 pounds, or you develop in leg swelling, experience more shortness of breath or chest pain, call your Primary MD immediately. Follow Cardiac Low Salt Diet and 1.5 lit/day fluid restriction.   On your next visit with your primary care physician please Get Medicines reviewed and adjusted.   Please request your Prim.MD to go over all Hospital Tests and Procedure/Radiological results at the follow up, please get all Hospital records sent to your Prim MD by signing hospital release before you go home.   If you experience worsening of your admission symptoms, develop shortness of breath, life threatening emergency, suicidal or homicidal thoughts you must seek medical attention immediately by calling 911 or calling your MD immediately  if symptoms less severe.  You Must read complete instructions/literature along with all the possible adverse reactions/side effects for all the Medicines you take and that have been prescribed to you. Take any new Medicines after you have completely understood and accpet all the possible adverse reactions/side effects.   Do not drive, operating heavy machinery, perform activities at heights, swimming or participation in water activities or provide baby sitting services if your were admitted  for syncope or siezures until you have seen by Primary MD or a Neurologist and advised to do so again.  Do not drive when taking Pain medications.    Do not take more than prescribed Pain, Sleep and Anxiety Medications  Special Instructions: If you have smoked or chewed Tobacco  in the last 2 yrs please stop smoking, stop any regular Alcohol  and or any Recreational drug use.  Wear Seat belts while driving.   Please note  You were cared for by a hospitalist during your hospital stay. If you have any questions about your discharge medications or the care you received while you were in the hospital after you are discharged, you can call the unit and asked to speak with the hospitalist on call if the hospitalist that took care of you is not available. Once you are discharged, your primary care physician will handle any further medical issues. Please note that NO REFILLS for any discharge medications will be authorized once you are discharged, as it is imperative that you return to your primary care physician (or establish a relationship with a primary care physician if you do not have one) for your aftercare needs so that they can reassess your need for medications and monitor your lab values.

## 2014-09-09 LAB — URINE CULTURE

## 2014-09-30 ENCOUNTER — Other Ambulatory Visit (HOSPITAL_COMMUNITY): Payer: Self-pay | Admitting: Cardiology

## 2014-11-21 NOTE — Telephone Encounter (Signed)
Error

## 2014-11-23 ENCOUNTER — Other Ambulatory Visit: Payer: Self-pay | Admitting: Internal Medicine

## 2014-12-18 ENCOUNTER — Telehealth (HOSPITAL_COMMUNITY): Payer: Self-pay

## 2014-12-18 NOTE — Telephone Encounter (Signed)
Disability records [facility] [address], [place], [place] [id number]

## 2015-01-20 ENCOUNTER — Other Ambulatory Visit: Payer: Self-pay | Admitting: Internal Medicine

## 2015-02-23 IMAGING — CR DG CHEST 1V PORT
1 series · 1 of 1 positions shown · non-contrast
Comparison: 10/09/2012

CLINICAL DATA: Central line placement, ventilated

PORTABLE CHEST - 1 VIEW

[AP]
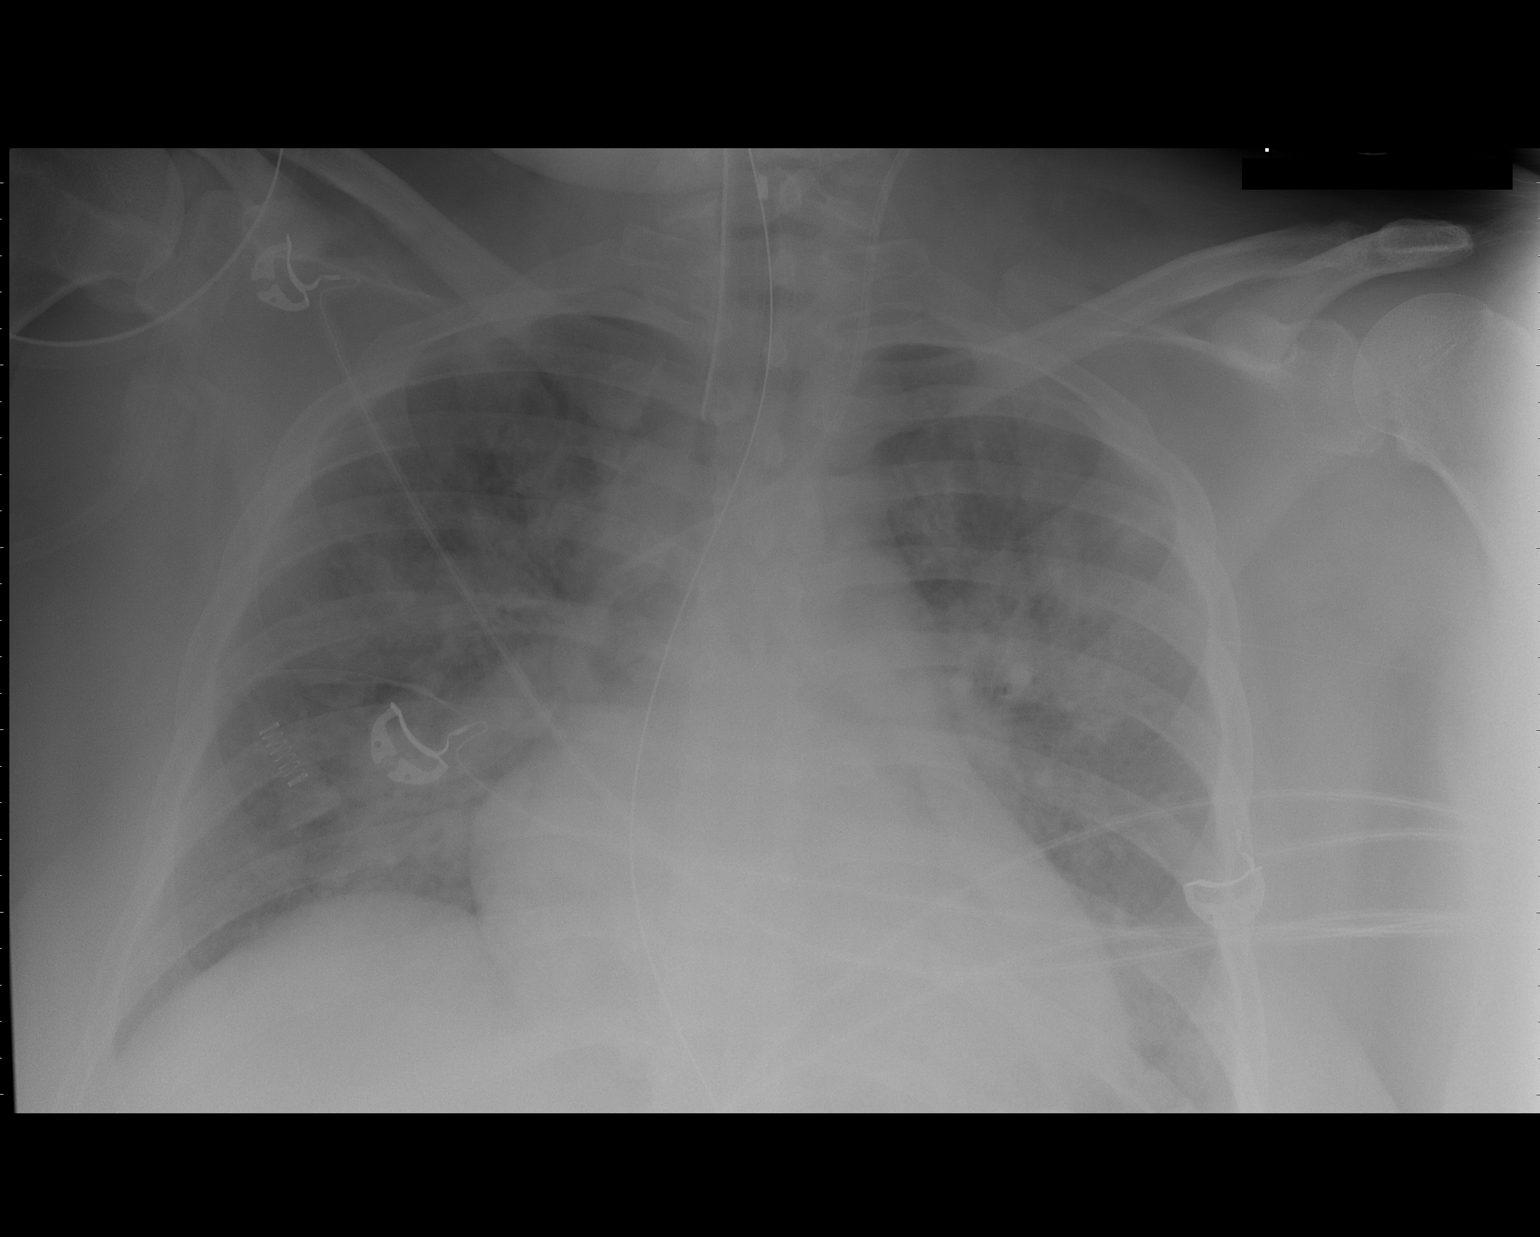

[1 of 1 positions shown; findings below may reference images not displayed]

FINDINGS: Left IJ central line tip terminates over the
brachiocephalic/SVC junction.  No pneumothorax.  Endotracheal tube
is appropriately positioned. Nasogastric tube terminates below the
level of the diaphragms but the tip is not included on the film.
Patchy pulmonary airspace opacities persist with mild cardiomegaly.
IMPRESSION: Left IJ central line tip over brachiocephalic/SVC junction.  No
pneumothorax.

## 2015-04-16 ENCOUNTER — Other Ambulatory Visit: Payer: Self-pay | Admitting: Internal Medicine

## 2015-05-11 ENCOUNTER — Other Ambulatory Visit: Payer: Self-pay | Admitting: Cardiology

## 2015-05-19 ENCOUNTER — Other Ambulatory Visit (HOSPITAL_COMMUNITY): Payer: Self-pay | Admitting: *Deleted

## 2015-05-19 MED ORDER — TORSEMIDE 20 MG PO TABS: ORAL_TABLET | ORAL | Status: DC

## 2015-05-19 MED ORDER — CARVEDILOL 12.5 MG PO TABS: ORAL_TABLET | ORAL | Status: DC

## 2015-05-26 ENCOUNTER — Encounter (HOSPITAL_COMMUNITY): Payer: Self-pay | Admitting: Internal Medicine

## 2015-08-20 ENCOUNTER — Other Ambulatory Visit (HOSPITAL_COMMUNITY): Payer: Self-pay | Admitting: *Deleted

## 2015-08-20 MED ORDER — METOLAZONE 2.5 MG PO TABS: 2.5000 mg | ORAL_TABLET | ORAL | 3 refills | Status: DC

## 2015-08-20 MED ORDER — TORSEMIDE 20 MG PO TABS: ORAL_TABLET | ORAL | 1 refills | Status: DC

## 2015-08-26 ENCOUNTER — Other Ambulatory Visit: Payer: Self-pay | Admitting: Internal Medicine

## 2015-09-17 ENCOUNTER — Other Ambulatory Visit (HOSPITAL_COMMUNITY): Payer: Self-pay | Admitting: Cardiology

## 2015-11-28 ENCOUNTER — Other Ambulatory Visit (HOSPITAL_COMMUNITY): Payer: Self-pay | Admitting: Internal Medicine

## 2015-12-02 ENCOUNTER — Other Ambulatory Visit (HOSPITAL_COMMUNITY): Payer: Self-pay | Admitting: *Deleted

## 2015-12-02 MED ORDER — CARVEDILOL 12.5 MG PO TABS: ORAL_TABLET | ORAL | 1 refills | Status: DC

## 2015-12-31 ENCOUNTER — Encounter (HOSPITAL_COMMUNITY): Payer: Self-pay

## 2015-12-31 NOTE — Progress Notes (Signed)
[facility] medical record request received via mail dated 12/08/2015 for records 09/25/2013--present. All available records from CHF clinic for requested time frame faxed to provided # [phone] (105 pages total). Copy of request scanned into patient's electronic medical record. Case # [id number]  Patient info forwarded to CHF scheduler as well to schedule f/u apt as she was supposed to follow up with our office 1 year ago.  Ave FilterBradley, [name], RN

## 2016-01-04 ENCOUNTER — Other Ambulatory Visit (HOSPITAL_COMMUNITY): Payer: Self-pay | Admitting: Internal Medicine

## 2016-01-09 ENCOUNTER — Encounter (HOSPITAL_COMMUNITY): Payer: Self-pay | Admitting: Vascular Surgery

## 2016-01-21 ENCOUNTER — Other Ambulatory Visit: Payer: Self-pay | Admitting: Internal Medicine

## 2016-01-21 ENCOUNTER — Other Ambulatory Visit: Payer: Self-pay | Admitting: Cardiology

## 2016-02-26 DIAGNOSIS — Z736 Limitation of activities due to disability: Secondary | ICD-10-CM

## 2016-03-15 ENCOUNTER — Other Ambulatory Visit: Payer: Self-pay | Admitting: Obstetrics and Gynecology

## 2016-03-15 DIAGNOSIS — R928 Other abnormal and inconclusive findings on diagnostic imaging of breast: Secondary | ICD-10-CM

## 2016-03-19 ENCOUNTER — Ambulatory Visit
Admission: RE | Admit: 2016-03-19 | Discharge: 2016-03-19 | Disposition: A | Discharge status: Home / Self Care | Payer: 59 | Source: Ambulatory Visit | Attending: Obstetrics and Gynecology | Admitting: Obstetrics and Gynecology

## 2016-03-19 DIAGNOSIS — R928 Other abnormal and inconclusive findings on diagnostic imaging of breast: Secondary | ICD-10-CM

## 2016-06-15 ENCOUNTER — Telehealth: Payer: Self-pay | Admitting: Internal Medicine

## 2016-06-15 NOTE — Telephone Encounter (Signed)
lmtcb X1 for pt. Pt has not been seen since 2014- will need to establish care with clinic before order can be placed for new cpap. Pt has seen [facility] in the past- has several openings on 6/1.  Need to schedule office visit.

## 2016-06-16 NOTE — Telephone Encounter (Signed)
Pt returned call and I let her know that she would need a rov before order would be sent.Caren GriffinsStanley A Dalton

## 2016-06-16 NOTE — Telephone Encounter (Signed)
Appoint sched for 5/31.Caren GriffinsStanley A Dalton

## 2016-06-16 NOTE — Telephone Encounter (Signed)
Will close encounter- issues will be addressed on 5/31 ov.

## 2016-06-16 NOTE — Telephone Encounter (Signed)
lmtcb X2 for pt.  Openings on 6/1 with [facility] have been filled.

## 2016-06-24 ENCOUNTER — Ambulatory Visit: Payer: Self-pay | Admitting: Pulmonary Disease
